# Patient Record
Sex: Female | Born: 1955 | Race: White | Hispanic: No | Smoking: Former smoker
Health system: Southern US, Community
[De-identification: ages and names within clinical notes are randomized; demographics above are authoritative.]

## PROBLEM LIST (undated history)

## (undated) DIAGNOSIS — E785 Hyperlipidemia, unspecified: Secondary | ICD-10-CM

## (undated) DIAGNOSIS — R931 Abnormal findings on diagnostic imaging of heart and coronary circulation: Secondary | ICD-10-CM

## (undated) DIAGNOSIS — I10 Essential (primary) hypertension: Secondary | ICD-10-CM

## (undated) DIAGNOSIS — F419 Anxiety disorder, unspecified: Secondary | ICD-10-CM

## (undated) DIAGNOSIS — E119 Type 2 diabetes mellitus without complications: Secondary | ICD-10-CM

## (undated) DIAGNOSIS — J189 Pneumonia, unspecified organism: Secondary | ICD-10-CM

## (undated) DIAGNOSIS — Z8719 Personal history of other diseases of the digestive system: Secondary | ICD-10-CM

## (undated) DIAGNOSIS — E039 Hypothyroidism, unspecified: Secondary | ICD-10-CM

## (undated) DIAGNOSIS — M1990 Unspecified osteoarthritis, unspecified site: Secondary | ICD-10-CM

## (undated) DIAGNOSIS — E079 Disorder of thyroid, unspecified: Secondary | ICD-10-CM

## (undated) DIAGNOSIS — R0600 Dyspnea, unspecified: Secondary | ICD-10-CM

## (undated) DIAGNOSIS — J439 Emphysema, unspecified: Secondary | ICD-10-CM

## (undated) DIAGNOSIS — J449 Chronic obstructive pulmonary disease, unspecified: Secondary | ICD-10-CM

## (undated) DIAGNOSIS — R7301 Impaired fasting glucose: Secondary | ICD-10-CM

## (undated) DIAGNOSIS — Z8673 Personal history of transient ischemic attack (TIA), and cerebral infarction without residual deficits: Secondary | ICD-10-CM

## (undated) HISTORY — DX: Abnormal findings on diagnostic imaging of heart and coronary circulation: R93.1

## (undated) HISTORY — DX: Type 2 diabetes mellitus without complications: E11.9

## (undated) HISTORY — PX: EYE SURGERY: SHX253

## (undated) HISTORY — DX: Personal history of transient ischemic attack (TIA), and cerebral infarction without residual deficits: Z86.73

## (undated) HISTORY — DX: Impaired fasting glucose: R73.01

## (undated) HISTORY — DX: Hyperlipidemia, unspecified: E78.5

## (undated) HISTORY — DX: Emphysema, unspecified: J43.9

## (undated) HISTORY — PX: ESOPHAGOGASTRODUODENOSCOPY: SHX1529

---

## 2005-05-26 ENCOUNTER — Emergency Department (HOSPITAL_COMMUNITY): Admission: EM | Admit: 2005-05-26 | Discharge: 2005-05-26 | Payer: Self-pay | Admitting: Emergency Medicine

## 2005-09-29 ENCOUNTER — Emergency Department (HOSPITAL_COMMUNITY): Admission: EM | Admit: 2005-09-29 | Discharge: 2005-09-29 | Payer: Self-pay | Admitting: Emergency Medicine

## 2007-02-11 ENCOUNTER — Ambulatory Visit (HOSPITAL_COMMUNITY): Admission: RE | Admit: 2007-02-11 | Discharge: 2007-02-11 | Payer: Self-pay | Admitting: Interventional Cardiology

## 2013-09-20 ENCOUNTER — Inpatient Hospital Stay (HOSPITAL_COMMUNITY)
Admission: EM | Admit: 2013-09-20 | Discharge: 2013-09-22 | DRG: 193 | Disposition: A | Discharge status: Home / Self Care | Payer: Self-pay | Attending: Internal Medicine | Admitting: Internal Medicine

## 2013-09-20 ENCOUNTER — Emergency Department (HOSPITAL_COMMUNITY): Payer: Self-pay

## 2013-09-20 ENCOUNTER — Encounter (HOSPITAL_COMMUNITY): Payer: Self-pay | Admitting: Emergency Medicine

## 2013-09-20 DIAGNOSIS — E039 Hypothyroidism, unspecified: Secondary | ICD-10-CM

## 2013-09-20 DIAGNOSIS — J441 Chronic obstructive pulmonary disease with (acute) exacerbation: Secondary | ICD-10-CM | POA: Diagnosis present

## 2013-09-20 DIAGNOSIS — I10 Essential (primary) hypertension: Secondary | ICD-10-CM | POA: Diagnosis present

## 2013-09-20 DIAGNOSIS — F411 Generalized anxiety disorder: Secondary | ICD-10-CM | POA: Diagnosis present

## 2013-09-20 DIAGNOSIS — J9600 Acute respiratory failure, unspecified whether with hypoxia or hypercapnia: Secondary | ICD-10-CM | POA: Diagnosis present

## 2013-09-20 DIAGNOSIS — Z23 Encounter for immunization: Secondary | ICD-10-CM

## 2013-09-20 DIAGNOSIS — J069 Acute upper respiratory infection, unspecified: Secondary | ICD-10-CM

## 2013-09-20 DIAGNOSIS — J9601 Acute respiratory failure with hypoxia: Secondary | ICD-10-CM | POA: Diagnosis present

## 2013-09-20 DIAGNOSIS — J1100 Influenza due to unidentified influenza virus with unspecified type of pneumonia: Principal | ICD-10-CM | POA: Diagnosis present

## 2013-09-20 DIAGNOSIS — Z87891 Personal history of nicotine dependence: Secondary | ICD-10-CM

## 2013-09-20 DIAGNOSIS — J439 Emphysema, unspecified: Secondary | ICD-10-CM | POA: Diagnosis present

## 2013-09-20 HISTORY — DX: Hypothyroidism, unspecified: E03.9

## 2013-09-20 HISTORY — DX: Essential (primary) hypertension: I10

## 2013-09-20 HISTORY — DX: Disorder of thyroid, unspecified: E07.9

## 2013-09-20 HISTORY — DX: Chronic obstructive pulmonary disease, unspecified: J44.9

## 2013-09-20 HISTORY — DX: Anxiety disorder, unspecified: F41.9

## 2013-09-20 LAB — BASIC METABOLIC PANEL
BUN: 19 mg/dL (ref 6–23)
CALCIUM: 9.3 mg/dL (ref 8.4–10.5)
CHLORIDE: 94 meq/L — AB (ref 96–112)
CO2: 25 mEq/L (ref 19–32)
Creatinine, Ser: 0.67 mg/dL (ref 0.50–1.10)
GFR calc Af Amer: >90 mL/min (ref >90)
GFR calc non Af Amer: >90 mL/min (ref >90)
Glucose, Bld: 100 mg/dL — ABNORMAL HIGH (ref 70–99)
Potassium: 4.5 mEq/L (ref 3.7–5.3)
SODIUM: 137 meq/L (ref 137–147)

## 2013-09-20 LAB — URINALYSIS, ROUTINE W REFLEX MICROSCOPIC
Glucose, UA: NEGATIVE mg/dL
HGB URINE DIPSTICK: NEGATIVE
Ketones, ur: 40 mg/dL — AB
NITRITE: NEGATIVE
PH: 5.5 (ref 5.0–8.0)
Protein, ur: 30 mg/dL — AB
SPECIFIC GRAVITY, URINE: 1.031 — AB (ref 1.005–1.030)
Urobilinogen, UA: 1 mg/dL (ref 0.0–1.0)

## 2013-09-20 LAB — URINE MICROSCOPIC-ADD ON

## 2013-09-20 LAB — CBC WITH DIFFERENTIAL/PLATELET
BASOS ABS: 0 10*3/uL (ref 0.0–0.1)
BASOS PCT: 0 % (ref 0–1)
EOS ABS: 0 10*3/uL (ref 0.0–0.7)
EOS PCT: 0 % (ref 0–5)
HEMATOCRIT: 43.4 % (ref 36.0–46.0)
HEMOGLOBIN: 15.1 g/dL — AB (ref 12.0–15.0)
LYMPHS ABS: 0.5 10*3/uL — AB (ref 0.7–4.0)
LYMPHS PCT: 9 % — AB (ref 12–46)
MCH: 31.5 pg (ref 26.0–34.0)
MCHC: 34.8 g/dL (ref 30.0–36.0)
MCV: 90.6 fL (ref 78.0–100.0)
Monocytes Absolute: 0.4 10*3/uL (ref 0.1–1.0)
Monocytes Relative: 7 % (ref 3–12)
Neutro Abs: 4.6 10*3/uL (ref 1.7–7.7)
Neutrophils Relative %: 84 % — ABNORMAL HIGH (ref 43–77)
Platelets: 305 10*3/uL (ref 150–400)
RBC: 4.79 MIL/uL (ref 3.87–5.11)
RDW: 14.4 % (ref 11.5–15.5)
WBC: 5.4 10*3/uL (ref 4.0–10.5)

## 2013-09-20 LAB — PRO B NATRIURETIC PEPTIDE: PRO B NATRI PEPTIDE: 256.1 pg/mL — AB (ref 0–125)

## 2013-09-20 LAB — STREP PNEUMONIAE URINARY ANTIGEN: STREP PNEUMO URINARY ANTIGEN: NEGATIVE

## 2013-09-20 MED ORDER — ASPIRIN EC 81 MG PO TBEC
81.0000 mg | DELAYED_RELEASE_TABLET | Freq: Every morning | ORAL | Status: DC
  Administered 2013-09-21 – 2013-09-22 (×2): 81 mg via ORAL
  Filled 2013-09-20 (×2): qty 1

## 2013-09-20 MED ORDER — GUAIFENESIN-DM 100-10 MG/5ML PO SYRP
5.0000 mL | ORAL_SOLUTION | ORAL | Status: DC | PRN
  Administered 2013-09-21 – 2013-09-22 (×2): 5 mL via ORAL
  Administered 2013-09-22: 09:00:00 via ORAL
  Filled 2013-09-20 (×3): qty 5

## 2013-09-20 MED ORDER — IPRATROPIUM BROMIDE 0.02 % IN SOLN: 0.5000 mg | RESPIRATORY_TRACT | Status: DC | PRN

## 2013-09-20 MED ORDER — ONDANSETRON HCL 4 MG/2ML IJ SOLN: 4.0000 mg | Freq: Four times a day (QID) | INTRAMUSCULAR | Status: DC | PRN

## 2013-09-20 MED ORDER — MORPHINE SULFATE 2 MG/ML IJ SOLN: 1.0000 mg | INTRAMUSCULAR | Status: DC | PRN

## 2013-09-20 MED ORDER — LEVOFLOXACIN IN D5W 750 MG/150ML IV SOLN
750.0000 mg | INTRAVENOUS | Status: DC
  Administered 2013-09-20 – 2013-09-21 (×2): 750 mg via INTRAVENOUS
  Filled 2013-09-20 (×3): qty 150

## 2013-09-20 MED ORDER — SODIUM CHLORIDE 0.9 % IV SOLN
INTRAVENOUS | Status: DC
  Administered 2013-09-20: 22:00:00 via INTRAVENOUS

## 2013-09-20 MED ORDER — ALBUTEROL (5 MG/ML) CONTINUOUS INHALATION SOLN
10.0000 mg/h | INHALATION_SOLUTION | Freq: Once | RESPIRATORY_TRACT | Status: AC
  Administered 2013-09-20: 10 mg/h via RESPIRATORY_TRACT

## 2013-09-20 MED ORDER — LEVOTHYROXINE SODIUM 112 MCG PO TABS
112.0000 ug | ORAL_TABLET | Freq: Every day | ORAL | Status: DC
  Administered 2013-09-21 – 2013-09-22 (×2): 112 ug via ORAL
  Filled 2013-09-20 (×4): qty 1

## 2013-09-20 MED ORDER — HYDROCODONE-ACETAMINOPHEN 5-325 MG PO TABS: 1.0000 | ORAL_TABLET | ORAL | Status: DC | PRN

## 2013-09-20 MED ORDER — LEVOFLOXACIN 500 MG PO TABS: 500.0000 mg | ORAL_TABLET | Freq: Every day | ORAL | Status: DC

## 2013-09-20 MED ORDER — PREDNISONE 10 MG PO TABS: 20.0000 mg | ORAL_TABLET | Freq: Every day | ORAL | Status: DC

## 2013-09-20 MED ORDER — ACETAMINOPHEN 650 MG RE SUPP: 650.0000 mg | Freq: Four times a day (QID) | RECTAL | Status: DC | PRN

## 2013-09-20 MED ORDER — DILTIAZEM HCL ER 90 MG PO CP12: 135.0000 mg | ORAL_CAPSULE | Freq: Two times a day (BID) | ORAL | Status: DC

## 2013-09-20 MED ORDER — ALBUTEROL SULFATE (2.5 MG/3ML) 0.083% IN NEBU: 2.5000 mg | INHALATION_SOLUTION | RESPIRATORY_TRACT | Status: DC | PRN

## 2013-09-20 MED ORDER — ALBUTEROL SULFATE (2.5 MG/3ML) 0.083% IN NEBU: 2.5000 mg | INHALATION_SOLUTION | RESPIRATORY_TRACT | Status: DC

## 2013-09-20 MED ORDER — ALBUTEROL SULFATE HFA 108 (90 BASE) MCG/ACT IN AERS
2.0000 | INHALATION_SPRAY | RESPIRATORY_TRACT | Status: DC | PRN
  Administered 2013-09-20: 2 via RESPIRATORY_TRACT
  Filled 2013-09-20: qty 6.7

## 2013-09-20 MED ORDER — SODIUM CHLORIDE 0.9 % IV BOLUS (SEPSIS)
1000.0000 mL | Freq: Once | INTRAVENOUS | Status: AC
  Administered 2013-09-20: 1000 mL via INTRAVENOUS

## 2013-09-20 MED ORDER — ACETAMINOPHEN 325 MG PO TABS: 650.0000 mg | ORAL_TABLET | Freq: Four times a day (QID) | ORAL | Status: DC | PRN

## 2013-09-20 MED ORDER — METHYLPREDNISOLONE SODIUM SUCC 125 MG IJ SOLR
60.0000 mg | Freq: Two times a day (BID) | INTRAMUSCULAR | Status: DC
  Administered 2013-09-20: 60 mg via INTRAVENOUS
  Administered 2013-09-21: 11:00:00 via INTRAVENOUS
  Administered 2013-09-21 – 2013-09-22 (×2): 60 mg via INTRAVENOUS
  Filled 2013-09-20 (×3): qty 0.96
  Filled 2013-09-20: qty 2
  Filled 2013-09-20 (×2): qty 0.96

## 2013-09-20 MED ORDER — IPRATROPIUM BROMIDE 0.02 % IN SOLN: 0.5000 mg | RESPIRATORY_TRACT | Status: DC

## 2013-09-20 MED ORDER — DEXAMETHASONE SODIUM PHOSPHATE 10 MG/ML IJ SOLN
10.0000 mg | Freq: Once | INTRAMUSCULAR | Status: AC
  Administered 2013-09-20: 10 mg via INTRAVENOUS
  Filled 2013-09-20: qty 1

## 2013-09-20 MED ORDER — ATENOLOL 25 MG PO TABS
25.0000 mg | ORAL_TABLET | Freq: Every day | ORAL | Status: DC
  Administered 2013-09-22: 25 mg via ORAL
  Filled 2013-09-20 (×2): qty 1

## 2013-09-20 MED ORDER — LEVOFLOXACIN 500 MG PO TABS
500.0000 mg | ORAL_TABLET | Freq: Once | ORAL | Status: AC
  Administered 2013-09-20: 500 mg via ORAL
  Filled 2013-09-20: qty 1

## 2013-09-20 MED ORDER — IPRATROPIUM-ALBUTEROL 0.5-2.5 (3) MG/3ML IN SOLN
3.0000 mL | RESPIRATORY_TRACT | Status: DC
  Administered 2013-09-21: 3 mL via RESPIRATORY_TRACT
  Filled 2013-09-20: qty 3

## 2013-09-20 MED ORDER — DILTIAZEM HCL 90 MG PO TABS
135.0000 mg | ORAL_TABLET | Freq: Two times a day (BID) | ORAL | Status: DC
Start: 2013-09-20 — End: 2013-09-22
  Administered 2013-09-21 – 2013-09-22 (×2): 135 mg via ORAL
  Filled 2013-09-20 (×6): qty 1.5

## 2013-09-20 MED ORDER — IPRATROPIUM BROMIDE 0.02 % IN SOLN
0.5000 mg | Freq: Once | RESPIRATORY_TRACT | Status: AC
  Administered 2013-09-20: 0.5 mg via RESPIRATORY_TRACT
  Filled 2013-09-20: qty 2.5

## 2013-09-20 MED ORDER — ONDANSETRON HCL 4 MG PO TABS: 4.0000 mg | ORAL_TABLET | Freq: Four times a day (QID) | ORAL | Status: DC | PRN

## 2013-09-20 NOTE — Discharge Instructions (Signed)
Chronic Obstructive Pulmonary Disease Exacerbation Chronic obstructive pulmonary disease (COPD) is a common lung condition in which airflow from the lungs is limited. COPD is a general term that can be used to describe many different lung problems that limit airflow, including chronic bronchitis and emphysema. COPD exacerbations are episodes when breathing symptoms become much worse and require extra treatment. Without treatment, COPD exacerbations can be life threatening, and frequent COPD exacerbations can cause further damage to your lungs. CAUSES   Respiratory infections.   Exposure to smoke.   Exposure to air pollution, chemical fumes, or dust. Sometimes there is no apparent cause or trigger. RISK FACTORS  Smoking cigarettes.  Older age.  Frequent prior COPD exacerbations. SIGNS AND SYMPTOMS   Increased coughing.   Increased thick spit (sputum) production.   Increased wheezing.   Increased shortness of breath.   Rapid breathing.   Chest tightness. DIAGNOSIS  Your medical history, a physical exam, and tests will help your health care provider make a diagnosis. Tests may include:  A chest X-ray.  Basic lab tests.  Sputum testing.  An arterial blood gas test. TREATMENT  Depending on the severity of your COPD exacerbation, you may need to be admitted to a hospital for treatment. Some of the treatments commonly used to treat COPD exacerbations are:   Antibiotic medicines.   Bronchodilators. These are drugs that expand the air passages. They may be given with an inhaler or nebulizer. Spacer devices may be needed to help improve drug delivery.  Corticosteroid medicines.  Supplemental oxygen therapy.  HOME CARE INSTRUCTIONS   Do not smoke. Quitting smoking is very important to prevent COPD from getting worse and exacerbations from happening as often.  Avoid exposure to all substances that irritate the airway, especially to tobacco smoke.   If prescribed,  take your antibiotics as directed. Finish them even if you start to feel better.  Only take over-the-counter or prescription medicines as directed by your health care provider.It is important to use correct technique with inhaled medicines.  Drink enough fluids to keep your urine clear or pale yellow (unless you have a medical condition that requires fluid restriction).  Use a cool mist vaporizer. This makes it easier to clear your chest when you cough.   If you have a home nebulizer and oxygen, continue to use them as directed.   Maintain all necessary vaccinations to prevent infections.   Exercise regularly.   Eat a healthy diet.   Keep all follow-up appointments as directed by your health care provider. SEEK IMMEDIATE MEDICAL CARE IF:  You have worsening shortness of breath.   You have trouble talking.   You have severe chest pain.  You have blood in your sputum.  You have a fever.  You have weakness, vomit repeatedly, or faint.   You feel confused.   You continue to get worse. MAKE SURE YOU:   Understand these instructions.  Will watch your condition.  Will get help right away if you are not doing well or get worse. Document Released: 06/15/2007 Document Revised: 06/08/2013 Document Reviewed: 04/22/2013 [facility] Patient Information 2014 [place].  Upper Respiratory Infection, Adult An upper respiratory infection (URI) is also sometimes known as the common cold. The upper respiratory tract includes the nose, sinuses, throat, trachea, and bronchi. Bronchi are the airways leading to the lungs. Most people improve within 1 week, but symptoms can last up to 2 weeks. A residual cough may last even longer.  CAUSES Many different viruses can infect the  tissues lining the upper respiratory tract. The tissues become irritated and inflamed and often become very moist. Mucus production is also common. A cold is contagious. You can easily spread the virus to  others by oral contact. This includes kissing, sharing a glass, coughing, or sneezing. Touching your mouth or nose and then touching a surface, which is then touched by another person, can also spread the virus. SYMPTOMS  Symptoms typically develop 1 to 3 days after you come in contact with a cold virus. Symptoms vary from person to person. They may include:  Runny nose.  Sneezing.  Nasal congestion.  Sinus irritation.  Sore throat.  Loss of voice (laryngitis).  Cough.  Fatigue.  Muscle aches.  Loss of appetite.  Headache.  Low-grade fever. DIAGNOSIS  You might diagnose your own cold based on familiar symptoms, since most people get a cold 2 to 3 times a year. Your caregiver can confirm this based on your exam. Most importantly, your caregiver can check that your symptoms are not due to another disease such as strep throat, sinusitis, pneumonia, asthma, or epiglottitis. Blood tests, throat tests, and X-rays are not necessary to diagnose a common cold, but they may sometimes be helpful in excluding other more serious diseases. Your caregiver will decide if any further tests are required. RISKS AND COMPLICATIONS  You may be at risk for a more severe case of the common cold if you smoke cigarettes, have chronic heart disease (such as heart failure) or lung disease (such as asthma), or if you have a weakened immune system. The very young and very old are also at risk for more serious infections. Bacterial sinusitis, middle ear infections, and bacterial pneumonia can complicate the common cold. The common cold can worsen asthma and chronic obstructive pulmonary disease (COPD). Sometimes, these complications can require emergency medical care and may be life-threatening. PREVENTION  The best way to protect against getting a cold is to practice good hygiene. Avoid oral or hand contact with people with cold symptoms. Wash your hands often if contact occurs. There is no clear evidence that  vitamin C, vitamin E, echinacea, or exercise reduces the chance of developing a cold. However, it is always recommended to get plenty of rest and practice good nutrition. TREATMENT  Treatment is directed at relieving symptoms. There is no cure. Antibiotics are not effective, because the infection is caused by a virus, not by bacteria. Treatment may include:  Increased fluid intake. Sports drinks offer valuable electrolytes, sugars, and fluids.  Breathing heated mist or steam (vaporizer or shower).  Eating chicken soup or other clear broths, and maintaining good nutrition.  Getting plenty of rest.  Using gargles or lozenges for comfort.  Controlling fevers with ibuprofen or acetaminophen as directed by your caregiver.  Increasing usage of your inhaler if you have asthma. Zinc gel and zinc lozenges, taken in the first 24 hours of the common cold, can shorten the duration and lessen the severity of symptoms. Pain medicines may help with fever, muscle aches, and throat pain. A variety of non-prescription medicines are available to treat congestion and runny nose. Your caregiver can make recommendations and may suggest nasal or lung inhalers for other symptoms.  HOME CARE INSTRUCTIONS   Only take over-the-counter or prescription medicines for pain, discomfort, or fever as directed by your caregiver.  Use a warm mist humidifier or inhale steam from a shower to increase air moisture. This may keep secretions moist and make it easier to breathe.  Drink enough  water and fluids to keep your urine clear or pale yellow.  Rest as needed.  Return to work when your temperature has returned to normal or as your caregiver advises. You may need to stay home longer to avoid infecting others. You can also use a face mask and careful hand washing to prevent spread of the virus. SEEK MEDICAL CARE IF:   After the first few days, you feel you are getting worse rather than better.  You need your caregiver's  advice about medicines to control symptoms.  You develop chills, worsening shortness of breath, or brown or red sputum. These may be signs of pneumonia.  You develop yellow or brown nasal discharge or pain in the face, especially when you bend forward. These may be signs of sinusitis.  You develop a fever, swollen neck glands, pain with swallowing, or white areas in the back of your throat. These may be signs of strep throat. SEEK IMMEDIATE MEDICAL CARE IF:   You have a fever.  You develop severe or persistent headache, ear pain, sinus pain, or chest pain.  You develop wheezing, a prolonged cough, cough up blood, or have a change in your usual mucus (if you have chronic lung disease).  You develop sore muscles or a stiff neck. Document Released: 02/11/2001 Document Revised: 11/10/2011 Document Reviewed: 12/20/2010 [facility] Patient Information 2014 [place], [place].

## 2013-09-20 NOTE — Progress Notes (Signed)
  Pt admitted to the unit. Pt is stable, alert and oriented per baseline. Oriented to room, staff, and call bell. Educated to call for any assistance. Bed in lowest position, call bell within reach- will continue to monitor. 

## 2013-09-20 NOTE — ED Notes (Signed)
Waiting on medication from the pharmacy before discharging patient.

## 2013-09-20 NOTE — Progress Notes (Signed)
COPD Gold initiated. E-mail sent.

## 2013-09-20 NOTE — ED Notes (Signed)
Pt sent here by her doctor with low sats, cough, SOB. Hx of COPD.

## 2013-09-20 NOTE — ED Notes (Signed)
Pt reports flu like symptoms since yesterday am. Reports productive cough, congestion, and fever.

## 2013-09-20 NOTE — H&P (Signed)
[facility] History and Physical  Cheryl Haley WUJ:[id number] DOB: [birth year] DOA: 09/20/2013  Referring physician: ER physician PCP: Gennette Pac, MD   Chief Complaint: shortness of breath  HPI:  58 year old female with past medical history of COPD, HTN, levothyroxine who presented to [facility] ED 09/20/2013 with worsening shortness of breath, productive cough and fever for past couple of days. He felt flu like symptoms, upper respiratory tract congestion and though she would eventually feel better but her symptoms got worse. She had some chest discomfort with cough. No abdominal pain, no nausea or vomiting. No diarrhea or constipation. No blood in stool or urine. No lightheadedness or loss of consciousness. In ED, BP was 110/63, HR 97, Tmax 99.31F and oxygen saturation was 85% on room air but it has improved to 100% with 2 L [place] oxygen support. Admission blood work was essentially unremarkable. CXR did not reveal acute infectious process. For possible clinical pneumonia pt was started on Levaquin. Pt was given nebulizer treatments in ED and prednisone PO one dose.  Assessment and Plan:  Principal Problem:   Acute respiratory failure with hypoxia - likely due to COPD exacerbation, pneumonia - oxygen support via nasal canula to keep O2 saturation above 90% - continue nebulizer treatments every 4 hours as needed and scheduled - continue solumedrol 60 mg Q 12 hours IV - Levaquin for possible pneumonia Active Problems:   Community acquired pneumonia - started IV Levaquin - follow up blood culture results, resp culture, legionella, strep pneumo, influenza   COPD exacerbation - COPD gold alert order in place - COPD order set in place - management with BD steroids, antibiotics   HTN (hypertension) - continue atenolol and cardizem   Unspecified hypothyroidism - continue levothyroxine  Radiological Exams on Admission: Dg Chest Port 1 View 09/20/2013   IMPRESSION: No evidence of  acute airspace disease.     Code Status: Full Family Communication: Pt at bedside Disposition Plan: Admit for further evaluation  Leisa Lenz, MD  [facility] Pager [phone]  Review of Systems:  Constitutional: positive for fever, chills and malaise/fatigue. Negative for diaphoresis.  HENT: Negative for hearing loss, ear pain, nosebleeds, congestion, sore throat, neck pain, tinnitus and ear discharge.   Eyes: Negative for blurred vision, double vision, photophobia, pain, discharge and redness.  Respiratory: positive for cough, sputum production, shortness of breath, wheezing.   Cardiovascular: Negative for chest pain, palpitations, orthopnea, claudication and leg swelling.  Gastrointestinal: Negative for nausea, vomiting and abdominal pain. Negative for heartburn, constipation, blood in stool and melena.  Genitourinary: Negative for dysuria, urgency, frequency, hematuria and flank pain.  Musculoskeletal: Negative for myalgias, back pain, joint pain and falls.  Skin: Negative for itching and rash.  Neurological: Negative for dizziness and weakness. Negative for tingling, tremors, sensory change, speech change, focal weakness, loss of consciousness and headaches.  Endo/Heme/Allergies: Negative for environmental allergies and polydipsia. Does not bruise/bleed easily.  Psychiatric/Behavioral: Negative for suicidal ideas. The patient is not nervous/anxious.      Past Medical History  Diagnosis Date  . Hypertension   . Anxiety   . Thyroid disease   . COPD (chronic obstructive pulmonary disease)    History reviewed. No pertinent past surgical history. Social History:  reports that she has quit smoking. She does not have any smokeless tobacco history on file. She reports that she does not drink alcohol. Her drug history is not on file.  No Known Allergies  Family History: Family medical history significant for HTN, HLD  Prior to Admission medications   Medication Sig Start  Date End Date Taking? Authorizing Provider  aspirin EC 81 MG tablet Take 81 mg by mouth every morning.   Yes Historical Provider, MD  atenolol (TENORMIN) 25 MG tablet Take 25 mg by mouth daily.   Yes Historical Provider, MD  diltiazem (CARDIZEM SR) 90 MG 12 hr capsule Take 135 mg by mouth 2 (two) times daily. Takes 1.5 tabs twice daily   Yes Historical Provider, MD  levothyroxine (SYNTHROID, LEVOTHROID) 112 MCG tablet Take 112 mcg by mouth daily before breakfast.   Yes Historical Provider, MD  levofloxacin (LEVAQUIN) 500 MG tablet Take 1 tablet (500 mg total) by mouth daily. 09/20/13   Tiffany Marilu Favre, PA-C  predniSONE (DELTASONE) 10 MG tablet Take 2 tablets (20 mg total) by mouth daily. 09/20/13   Linus Mako, PA-C   Physical Exam: Filed Vitals:   09/20/13 1630 09/20/13 1635 09/20/13 1900 09/20/13 2005  BP:  110/63 111/64   Pulse:  112 97 106  Temp:      TempSrc:      Resp:  20 27 22   SpO2: 100% 100% 99% 85%    Physical Exam  Constitutional: Appears well-developed and well-nourished. No distress.  HENT: Normocephalic. No tonsillar erythema or exudates. Eyes: Conjunctivae and EOM are normal. PERRLA, no scleral icterus.  Neck: Normal ROM. Neck supple. No JVD. No tracheal deviation. No thyromegaly.  CVS: RRR, S1/S2 +, no murmurs, no gallops, no carotid bruit.  Pulmonary: wheezing appreciated in mid and upper lung lobes, no crackles Abdominal: Soft. BS +,  no distension, tenderness, rebound or guarding.  Musculoskeletal: Normal range of motion. No edema and no tenderness.  Lymphadenopathy: No lymphadenopathy noted, cervical, inguinal. Neuro: Alert. Normal reflexes, muscle tone coordination. No cranial nerve deficit. Skin: Skin is warm and dry. No rash noted. Not diaphoretic. No erythema. No pallor.  Psychiatric: Normal mood and affect. Behavior, judgment, thought content normal.   Labs on Admission:  Basic Metabolic Panel:  Recent Labs Lab 09/20/13 1640  NA 137  K 4.5  CL  94*  CO2 25  GLUCOSE 100*  BUN 19  CREATININE 0.67  CALCIUM 9.3   Liver Function Tests: No results found for this basename: AST, ALT, ALKPHOS, BILITOT, PROT, ALBUMIN,  in the last 168 hours No results found for this basename: LIPASE, AMYLASE,  in the last 168 hours No results found for this basename: AMMONIA,  in the last 168 hours CBC:  Recent Labs Lab 09/20/13 1640  WBC 5.4  NEUTROABS 4.6  HGB 15.1*  HCT 43.4  MCV 90.6  PLT 305   Cardiac Enzymes: No results found for this basename: CKTOTAL, CKMB, CKMBINDEX, TROPONINI,  in the last 168 hours BNP: No components found with this basename: POCBNP,  CBG: No results found for this basename: GLUCAP,  in the last 168 hours  If 7PM-7AM, please contact night-coverage [email or web address] Password [email or web address] 09/20/2013, 9:18 PM

## 2013-09-20 NOTE — ED Provider Notes (Signed)
CSN: [id number]     Arrival date & time 09/20/13  1544 History   First MD Initiated Contact with Patient 09/20/13 1547     Chief Complaint  Patient presents with  . Shortness of Breath   (Consider location/radiation/quality/duration/timing/severity/associated sxs/prior Treatment) HPI PCP: [facility]   Patient presents to the ED from [place] for SOB. She was saturating at the mid 80's at her doctor's office therefore they felt she should go to the emergency department for further care. She has a past medical history of COPD and hypertension. She has also been coughing a lot, shortness of breath and having some mild chest discomfort. She states that she did not want to come in but she got so bad that she did not have a choice. She is not on oxygen at home, she does not have any inhalers at home and has not tried any treatments. She denies nausea vomiting, diarrhea, fevers, body aches, weakness. Pt sating at 98% with 2 L [place] on in the ER.   Past Medical History  Diagnosis Date  . Hypertension   . Anxiety   . Thyroid disease   . COPD (chronic obstructive pulmonary disease)    History reviewed. No pertinent past surgical history. History reviewed. No pertinent family history. History  Substance Use Topics  . Smoking status: Former Research scientist (life sciences)  . Smokeless tobacco: Not on file  . Alcohol Use: No   OB History   Grav Para Term Preterm Abortions TAB SAB Ect Mult Living                 Review of Systems The patient denies anorexia, fever, weight loss,, vision loss, decreased hearing, hoarseness, chest pain, syncope, peripheral edema, balance deficits, hemoptysis, abdominal pain, melena, hematochezia, severe indigestion/heartburn, hematuria, incontinence, genital sores, muscle weakness, suspicious skin lesions, transient blindness, difficulty walking, depression, unusual weight change, abnormal bleeding, enlarged lymph nodes, angioedema, and breast masses.  Allergies  Review of  patient's allergies indicates no known allergies.  Home Medications   Current Outpatient Rx  Name  Route  Sig  Dispense  Refill  . aspirin EC 81 MG tablet   Oral   Take 81 mg by mouth every morning.         Marland Kitchen atenolol (TENORMIN) 25 MG tablet   Oral   Take 25 mg by mouth daily.         Marland Kitchen diltiazem (CARDIZEM SR) 90 MG 12 hr capsule   Oral   Take 135 mg by mouth 2 (two) times daily. Takes 1.5 tabs twice daily         . levothyroxine (SYNTHROID, LEVOTHROID) 112 MCG tablet   Oral   Take 112 mcg by mouth daily before breakfast.         . levofloxacin (LEVAQUIN) 500 MG tablet   Oral   Take 1 tablet (500 mg total) by mouth daily.   7 tablet   0   . predniSONE (DELTASONE) 10 MG tablet   Oral   Take 2 tablets (20 mg total) by mouth daily.   21 tablet   0     Prednisone dose pack directions:   6 tabs on day ...    BP 111/64  Pulse 97  Temp(Src) 99.5 F (37.5 C) (Oral)  Resp 27  SpO2 99% Physical Exam  Nursing note and vitals reviewed. Constitutional: She appears well-developed and well-nourished. No distress. She is not intubated.  HENT:  Head: Normocephalic and atraumatic.  Eyes: Pupils are equal, round,  and reactive to light.  Neck: Normal range of motion. Neck supple.  Cardiovascular: Normal rate and regular rhythm.   Pulmonary/Chest: Effort normal. No accessory muscle usage. She is not intubated. No respiratory distress. She has decreased breath sounds (pt coughing during exam). She has wheezes.  Abdominal: Soft.  Neurological: She is alert.  Skin: Skin is warm and dry.    ED Course  Procedures (including critical care time) Labs Review Labs Reviewed  CBC WITH DIFFERENTIAL - Abnormal; Notable for the following:    Hemoglobin 15.1 (*)    Neutrophils Relative % 84 (*)    Lymphocytes Relative 9 (*)    Lymphs Abs 0.5 (*)    All other components within normal limits  BASIC METABOLIC PANEL - Abnormal; Notable for the following:    Chloride 94 (*)     Glucose, Bld 100 (*)    All other components within normal limits  PRO B NATRIURETIC PEPTIDE - Abnormal; Notable for the following:    Pro B Natriuretic peptide (BNP) 256.1 (*)    All other components within normal limits  URINALYSIS, ROUTINE W REFLEX MICROSCOPIC - Abnormal; Notable for the following:    Color, Urine AMBER (*)    APPearance CLOUDY (*)    Specific Gravity, Urine 1.031 (*)    Bilirubin Urine MODERATE (*)    Ketones, ur 40 (*)    Protein, ur 30 (*)    Leukocytes, UA SMALL (*)    All other components within normal limits  URINE MICROSCOPIC-ADD ON - Abnormal; Notable for the following:    Squamous Epithelial / LPF FEW (*)    Bacteria, UA FEW (*)    Casts HYALINE CASTS (*)    All other components within normal limits  CULTURE, EXPECTORATED SPUTUM-ASSESSMENT   Imaging Review Dg Chest Port 1 View  09/20/2013   CLINICAL DATA:  Shortness of breath and cough.  COPD.  EXAM: PORTABLE CHEST - 1 VIEW  COMPARISON:  05/26/2005  FINDINGS: The cardiomediastinal silhouette is within normal limits. Thoracic aortic calcification is noted. The lungs are hyperinflated, slightly less so than on the prior study. There is no evidence of focal airspace consolidation, edema, pleural effusion, or pneumothorax. No acute osseous abnormality is identified.  IMPRESSION: No evidence of acute airspace disease.   Electronically Signed   By: Logan Bores   On: 09/20/2013 16:30    EKG Interpretation   None       MDM   1. URI (upper respiratory infection)   2. COPD exacerbation    CRITICAL CARE Performed by: Linus Mako Total critical care time: 30 Critical care time was exclusive of separately billable procedures and treating other patients. Critical care was necessary to treat or prevent imminent or life-threatening deterioration. Critical care was time spent personally by me on the following activities: development of treatment plan with patient and/or surrogate as well as nursing,  discussions with consultants, evaluation of patient's response to treatment, examination of patient, obtaining history from patient or surrogate, ordering and performing treatments and interventions, ordering and review of laboratory studies, ordering and review of radiographic studies, pulse oximetry and re-evaluation of patient's condition.  Pt given 60 mg PO Prednisone and 1 hour continuous neb in the ED. She showed significant improvement and says she is feeling much better.  Pt ambulated by myself in the ED. Her O2 dropped to about 90-91 percent after a minute of standing up with no O2. She denied increased fatigue. I gave her the option of trying  outpatient treatment vs inpatient. She chooses to go home. She has a friend with her who says she will care for her so she doesn't have to exert much energy.   Will give her antibiotics, prednisone taper and an albuterol inhaler (inhaler given in ED). Her sister has a pulse ox machine at home, she has been advised to watch her oxygen, if it drops below 90 then she is to return to the ED for admission due to failed outpatient therapy.  58 y.o.Maudell Bratz's evaluation in the Emergency Department is complete. It has been determined that no acute conditions requiring further emergency intervention are present at this time. The patient/guardian have been advised of the diagnosis and plan. We have discussed signs and symptoms that warrant return to the ED, such as changes or worsening in symptoms.  Vital signs are stable at discharge. Filed Vitals:   09/20/13 1900  BP: 111/64  Pulse: 97  Temp:   Resp: 27    Patient/guardian has voiced understanding and agreed to follow-up with the PCP or specialist.    Linus Mako, PA-C 09/20/13 1908  Patient originally discharged and @ l [place] removed. When nurse went to discharge her she was sating at 85 % at rest. Will have to admit, PCP is Dr. Rex Kras, [facility], [facility]  [address], PA-C 09/20/13 2019

## 2013-09-21 ENCOUNTER — Encounter (HOSPITAL_COMMUNITY): Payer: Self-pay | Admitting: General Practice

## 2013-09-21 DIAGNOSIS — J069 Acute upper respiratory infection, unspecified: Secondary | ICD-10-CM

## 2013-09-21 LAB — COMPREHENSIVE METABOLIC PANEL
ALBUMIN: 3.7 g/dL (ref 3.5–5.2)
ALK PHOS: 52 U/L (ref 39–117)
ALT: 13 U/L (ref 0–35)
AST: 26 U/L (ref 0–37)
BUN: 11 mg/dL (ref 6–23)
CHLORIDE: 97 meq/L (ref 96–112)
CO2: 25 meq/L (ref 19–32)
Calcium: 8.5 mg/dL (ref 8.4–10.5)
Creatinine, Ser: 0.43 mg/dL — ABNORMAL LOW (ref 0.50–1.10)
GFR calc Af Amer: >90 mL/min (ref >90)
Glucose, Bld: 133 mg/dL — ABNORMAL HIGH (ref 70–99)
POTASSIUM: 4.5 meq/L (ref 3.7–5.3)
Sodium: 136 mEq/L — ABNORMAL LOW (ref 137–147)
Total Protein: 7.5 g/dL (ref 6.0–8.3)

## 2013-09-21 LAB — INFLUENZA PANEL BY PCR (TYPE A & B)
H1N1 flu by pcr: NOT DETECTED
Influenza A By PCR: POSITIVE — AB
Influenza B By PCR: NEGATIVE

## 2013-09-21 LAB — CBC
HCT: 37.2 % (ref 36.0–46.0)
Hemoglobin: 12.9 g/dL (ref 12.0–15.0)
MCH: 31.2 pg (ref 26.0–34.0)
MCHC: 34.7 g/dL (ref 30.0–36.0)
MCV: 89.9 fL (ref 78.0–100.0)
PLATELETS: 260 10*3/uL (ref 150–400)
RBC: 4.14 MIL/uL (ref 3.87–5.11)
RDW: 14.4 % (ref 11.5–15.5)
WBC: 4.2 10*3/uL (ref 4.0–10.5)

## 2013-09-21 LAB — LEGIONELLA ANTIGEN, URINE: Legionella Antigen, Urine: NEGATIVE

## 2013-09-21 LAB — GLUCOSE, CAPILLARY: GLUCOSE-CAPILLARY: 132 mg/dL — AB (ref 70–99)

## 2013-09-21 MED ORDER — MOMETASONE FURO-FORMOTEROL FUM 100-5 MCG/ACT IN AERO
2.0000 | INHALATION_SPRAY | Freq: Two times a day (BID) | RESPIRATORY_TRACT | Status: DC
  Administered 2013-09-21 – 2013-09-22 (×2): 2 via RESPIRATORY_TRACT
  Filled 2013-09-21: qty 8.8

## 2013-09-21 MED ORDER — DIPHENHYDRAMINE HCL 25 MG PO CAPS
25.0000 mg | ORAL_CAPSULE | Freq: Once | ORAL | Status: DC
  Filled 2013-09-21: qty 1

## 2013-09-21 MED ORDER — ENSURE COMPLETE PO LIQD
237.0000 mL | Freq: Two times a day (BID) | ORAL | Status: DC
  Administered 2013-09-21 – 2013-09-22 (×3): 237 mL via ORAL

## 2013-09-21 MED ORDER — OSELTAMIVIR PHOSPHATE 75 MG PO CAPS
75.0000 mg | ORAL_CAPSULE | Freq: Two times a day (BID) | ORAL | Status: DC
  Administered 2013-09-21 – 2013-09-22 (×3): 75 mg via ORAL
  Filled 2013-09-21 (×4): qty 1

## 2013-09-21 MED ORDER — SALINE SPRAY 0.65 % NA SOLN
1.0000 | NASAL | Status: DC | PRN
  Filled 2013-09-21: qty 44

## 2013-09-21 MED ORDER — IPRATROPIUM-ALBUTEROL 0.5-2.5 (3) MG/3ML IN SOLN
3.0000 mL | Freq: Three times a day (TID) | RESPIRATORY_TRACT | Status: DC
  Administered 2013-09-21 – 2013-09-22 (×4): 3 mL via RESPIRATORY_TRACT
  Filled 2013-09-21 (×5): qty 3

## 2013-09-21 MED ORDER — INFLUENZA VAC SPLIT QUAD 0.5 ML IM SUSP
0.5000 mL | INTRAMUSCULAR | Status: AC
Start: 2013-09-22 — End: 2013-09-22
  Administered 2013-09-22: 0.5 mL via INTRAMUSCULAR
  Filled 2013-09-21: qty 0.5

## 2013-09-21 NOTE — Progress Notes (Signed)
INITIAL NUTRITION ASSESSMENT  DOCUMENTATION CODES Per approved criteria  -Severe malnutrition in the context of chronic illness -Underweight   INTERVENTION: Add Ensure Complete po BID, each supplement provides 350 kcal and 13 grams of protein, prefers chocolate. RD to continue to follow nutrition care plan.  NUTRITION DIAGNOSIS: Increased nutrient needs related to COPD as evidenced by estimated needs.   Goal: Intake to meet >90% of estimated nutrition needs.  Monitor:  weight trends, lab trends, I/O's, PO intake, supplement tolerance  Reason for Assessment: Malnutrition Screening Tool  58 y.o. female  Admitting Dx: Acute respiratory failure with hypoxia  ASSESSMENT: PMHx significant for COPD, HTN. Admitted with worsening SOB, productive cough and fever x a few days. Work-up reveals COPD exacerbation and CAP.  COPD GOLD protocol initiated. RD consulted accordingly for Assessment.  Pt's current body weight of 99 lb puts her in the underweight category given BMI is 17.1. Pt reports that her usual weight is between 100 - 110 lb. Attributes most recent weight loss down to 99 lb of being on Clear Liquids for 2 days. MD has just upgraded her diet to Regular, which she is excited about. Normally eats 3 meals a day. Reports good appetite prior to symptoms. Has tried Ensure and likes it, doesn't drink it Regularly 2/2 cost. Encouraged store-brand if needed. Pt would like Ensure while here.  Nutrition Focused Physical Exam:  Subcutaneous Fat:  Orbital Region: moderate depletion Upper Arm Region: severe depletion Thoracic and Lumbar Region: moderate depletion  Muscle:  Temple Region: severe depletion Clavicle Bone Region: moderate depletion Clavicle and Acromion Bone Region: moderate depletion Scapular Bone Region: moderate depletion Dorsal Hand: n/a Patellar Region: n/a Anterior Thigh Region: n/a Posterior Calf Region: n/a  Edema: none  Pt meets criteria for severe  MALNUTRITION in the context of chronic illness as evidenced by severe fat and muscle mass loss.  Height: Ht Readings from Last 1 Encounters:  09/20/13 5\' 4"  (1.626 m)    Weight: Wt Readings from Last 1 Encounters:  09/21/13 99 lb 13.9 oz (45.3 kg)    Ideal Body Weight: 120 lb  % Ideal Body Weight: 82.5%  Wt Readings from Last 10 Encounters:  09/21/13 99 lb 13.9 oz (45.3 kg)    Usual Body Weight: 100 - 110 lb  % Usual Body Weight: 99%  BMI:  Body mass index is 17.13 kg/(m^2). Underweight  Estimated Nutritional Needs: Kcal: 1350 - 1500 Protein: 65 - 75 g Fluid: at least 1.5 liters daily  Skin: intact  Diet Order: General  EDUCATION NEEDS: -No education needs identified at this time   Intake/Output Summary (Last 24 hours) at 09/21/13 1242 Last data filed at 09/20/13 2157  Gross per 24 hour  Intake      0 ml  Output    210 ml  Net   -210 ml    Last BM: 1/18  Labs:   Recent Labs Lab 09/20/13 1640 09/21/13 0637  NA 137 136*  K 4.5 4.5  CL 94* 97  CO2 25 25  BUN 19 11  CREATININE 0.67 0.43*  CALCIUM 9.3 8.5  GLUCOSE 100* 133*    CBG (last 3)   Recent Labs  09/21/13 0750  GLUCAP 132*    Scheduled Meds: . aspirin EC  81 mg Oral q morning - 10a  . atenolol  25 mg Oral Daily  . diltiazem  135 mg Oral Q12H  . [START ON 09/22/2013] influenza vac split quadrivalent PF  0.5 mL Intramuscular Tomorrow-1000  .  ipratropium-albuterol  3 mL Nebulization TID  . levofloxacin (LEVAQUIN) IV  750 mg Intravenous Q24H  . levothyroxine  112 mcg Oral QAC breakfast  . methylPREDNISolone (SOLU-MEDROL) injection  60 mg Intravenous Q12H  . mometasone-formoterol  2 puff Inhalation BID  . oseltamivir  75 mg Oral BID    Continuous Infusions:    Past Medical History  Diagnosis Date  . Hypertension   . Anxiety   . Thyroid disease   . COPD (chronic obstructive pulmonary disease)     History reviewed. No pertinent past surgical history.  Inda Coke MS,  RD, LDN Pager: [phone] After-hours pager: [phone]

## 2013-09-21 NOTE — Evaluation (Signed)
Physical Therapy Evaluation Patient Details Name: Cheryl Haley MRN: [id number] DOB: Oct 26, 1955 Today's Date: 09/21/2013 Time: 1535-1600 PT Time Calculation (min): 25 min  PT Assessment / Plan / Recommendation History of Present Illness  58 year old female with past medical history of COPD, HTN, levothyroxine who presented to [facility] ED 09/20/2013 with worsening shortness of breath, productive cough and fever for past couple of days. He felt flu like symptoms, upper respiratory tract congestion and though she would eventually feel better but her symptoms got worse. She had some chest discomfort with cough. No abdominal pain, no nausea or vomiting. No diarrhea or constipation. No blood in stool or urine. No lightheadedness or loss of consciousness.  Clinical Impression  .Pt admitted with SOB due to Flu and COPD. Pt currently with functional limitations due to the deficits listed below (see PT Problem List).  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      PT Assessment  Patient needs continued PT services    Follow Up Recommendations  No PT follow up    Does the patient have the potential to tolerate intense rehabilitation      Barriers to Discharge        Equipment Recommendations  None recommended by PT    Recommendations for Other Services     Frequency Min 1X/week    Precautions / Restrictions Precautions Precautions: None Precaution Comments: presently needs O2   Pertinent Vitals/Pain Sats at rest on RA for 3 min.  at 91%.  With gait on RA sats dropped to 87% EHR 114 bpm.  With O@ at 2L [place] sats rose up to 93% during gait.  EHR at 109bpm.      Mobility  Bed Mobility Overal bed mobility: Independent Transfers Overall transfer level: Independent Ambulation/Gait Ambulation/Gait assistance:  Supervision Ambulation Distance (Feet): 380 Feet (with 3 standing rest breaks) Gait Pattern/deviations: WFL(Within Functional Limits) Gait velocity: slower Gait velocity interpretation: at or above normal speed for age/gender    Exercises     PT Diagnosis: Other (comment) (decr activity tolerance)  PT Problem List: Decreased activity tolerance;Cardiopulmonary status limiting activity PT Treatment Interventions: Gait training;Stair training;Functional mobility training;Patient/family education     PT Goals(Current goals can be found in the care plan section) Acute Rehab PT Goals Patient Stated Goal: Need to be Independent so I can help my room mate who is LVAD pt. PT Goal Formulation: With patient Time For Goal Achievement: 09/28/13 Potential to Achieve Goals: Good  Visit Information  Last PT Received On: 09/21/13 Assistance Needed: +1 History of Present Illness: 58 year old female with past medical history of COPD, HTN, levothyroxine who presented to [facility] ED 09/20/2013 with worsening shortness of breath, productive cough and fever for past couple of days. He felt flu like symptoms, upper respiratory tract congestion and though she would eventually feel better but her symptoms got worse. She had some chest discomfort with cough. No abdominal pain, no nausea or vomiting. No diarrhea or constipation. No blood in stool or urine. No lightheadedness or loss of consciousness.       Prior [place] expects to be discharged to:: Private residence Living Arrangements: Non-relatives/Friends Available Help at Discharge: Family;Available PRN/intermittently;Other (Comment) Type of Home: House Home Access: Stairs to enter Home Layout: One level Home Equipment: None Prior Function Level of Independence:  Independent Communication Communication: No difficulties    Cognition  Cognition Arousal/Alertness: Awake/alert Behavior During Therapy: WFL for tasks  assessed/performed Overall Cognitive Status: Within Functional Limits for tasks assessed    Extremity/Trunk Assessment Upper Extremity Assessment Upper Extremity Assessment: Overall WFL for tasks assessed Lower Extremity Assessment Lower Extremity Assessment: Overall WFL for tasks assessed   Balance Balance Overall balance assessment: No apparent balance deficits (not formally assessed)  End of Session PT - End of Session Equipment Utilized During Treatment: Oxygen Activity Tolerance: Patient tolerated treatment well;Other (comment) (when on oxygen) Patient left: in bed;with bed alarm set Nurse Communication: Mobility status  GP     Mang Hazelrigg, Tessie Fass 09/21/2013, 4:07 PM 09/21/2013  Donnella Sham, [place] [phone]  (pager)

## 2013-09-21 NOTE — Care Management Note (Addendum)
    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     Action/Plan:   Anticipated DC Date:  09/22/2013   Anticipated DC Plan:  [place]  CM consult  [facility]      Choice offered to / List presented to:     DME arranged  [facility]      DME agency  [facility].        Status of service:  Completed, signed off Medicare Important Message given?   (If response is "NO", the following Medicare IM given date fields will be blank) Date Medicare IM given:   Date Additional Medicare IM given:    Discharge Disposition:  HOME/SELF CARE  Per UR Regulation:  Reviewed for med. necessity/level of care/duration of stay  If discussed at [place] of Stay Meetings, dates discussed:    Comments:  09/22/13 16:27 Tomi Bamberger RN, BSN [phone] patient is for dc today, patient states she may need ast with medications, NCM will assist her with Match program, also awaiting correct order for home oxygen.  Larkin Ina with [facility] is speaking with patient to see if she can afford the home oxygen.  NCM will give patient the Match letter to help with medications. If patient can not afford the oxygen she may not be able to get home oxygen.  NCM unable to get on pdmi, called outpt pharmacy to ask if they can fill scripts for patient and I put her in system tomorrow they said yes.  NCM instructed patient scripts are $3 each and this is the only time we can ast her for these, she will go to outpt pharmacy to pick up.  Also she was able to get the oxygen, Larkin Ina brought up to her room.  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  NCM received referral for COPD Gold,  made referral to [facility]. NCM will continue to follow for dc needs.

## 2013-09-21 NOTE — Progress Notes (Signed)
UR completed. Farhiya Rosten RN CCM Case Mgmt 

## 2013-09-21 NOTE — Progress Notes (Signed)
[facility] PROGRESS NOTE  Cheryl Haley JSE:[id number] DOB: Jun 12, 1956 DOA: 09/20/2013 PCP: Gennette Pac, MD  Assessment/Plan: 57 year old female with past medical history of COPD, HTN, levothyroxine who presented to [facility] ED 09/20/2013 with worsening shortness of breath, productive cough and fever for past couple of days.    Principal Problem:  Acute respiratory failure with hypoxia  - likely due to COPD exacerbation, pneumonia  - oxygen support via nasal canula to keep O2 saturation above 90%  - continue nebulizer treatments every 4 hours as needed and scheduled  - continue solumedrol 60 mg Q 12 hours IV   Active Problems:  Community acquired pneumonia  - started IV Levaquin  - follow up blood culture results, resp culture, legionella, strep pneumo, +influenza  COPD exacerbation  - COPD gold alert order in place  - COPD order set in place  - management with BD steroids, antibiotics  HTN (hypertension)  - continue atenolol and cardizem  Unspecified hypothyroidism  - continue levothyroxine  Radiological Exams on Admission:  Dg Chest Port 1 View  09/20/2013 IMPRESSION: No evidence of acute airspace disease.    Code Status: full Family Communication: d/w patient (indicate person spoken with, relationship, and if by phone, the number) Disposition Plan: home 24-48 hours    Consultants:  None   Procedures:  None   Antibiotics:   (indicate start date, and stop date if known)  HPI/Subjective: alert  Objective: Filed Vitals:   09/21/13 0418  BP: 122/66  Pulse: 98  Temp: 98.7 F (37.1 C)  Resp: 20    Intake/Output Summary (Last 24 hours) at 09/21/13 1230 Last data filed at 09/20/13 2157  Gross per 24 hour  Intake      0 ml  Output    210 ml  Net   -210 ml   Filed Weights   09/20/13 2158 09/21/13 0216  Weight: 45.3 kg (99 lb 13.9 oz) 45.3 kg (99 lb 13.9 oz)    Exam:   General:  alert  Cardiovascular: s1,s2 rrr  Respiratory: poor  ventilation BL  Abdomen: soft, nt, nd   Musculoskeletal: no edema   Data Reviewed: Basic Metabolic Panel:  Recent Labs Lab 09/20/13 1640 09/21/13 0637  NA 137 136*  K 4.5 4.5  CL 94* 97  CO2 25 25  GLUCOSE 100* 133*  BUN 19 11  CREATININE 0.67 0.43*  CALCIUM 9.3 8.5   Liver Function Tests:  Recent Labs Lab 09/21/13 0637  AST 26  ALT 13  ALKPHOS 52  BILITOT <0.2*  PROT 7.5  ALBUMIN 3.7   No results found for this basename: LIPASE, AMYLASE,  in the last 168 hours No results found for this basename: AMMONIA,  in the last 168 hours CBC:  Recent Labs Lab 09/20/13 1640 09/21/13 0637  WBC 5.4 4.2  NEUTROABS 4.6  --   HGB 15.1* 12.9  HCT 43.4 37.2  MCV 90.6 89.9  PLT 305 260   Cardiac Enzymes: No results found for this basename: CKTOTAL, CKMB, CKMBINDEX, TROPONINI,  in the last 168 hours BNP (last 3 results)  Recent Labs  09/20/13 1640  PROBNP 256.1*   CBG:  Recent Labs Lab 09/21/13 0750  GLUCAP 132*    No results found for this or any previous visit (from the past 240 hour(s)).   Studies: Dg Chest Port 1 View  09/20/2013   CLINICAL DATA:  Shortness of breath and cough.  COPD.  EXAM: PORTABLE CHEST - 1 VIEW  COMPARISON:  05/26/2005  FINDINGS: The cardiomediastinal silhouette is within normal limits. Thoracic aortic calcification is noted. The lungs are hyperinflated, slightly less so than on the prior study. There is no evidence of focal airspace consolidation, edema, pleural effusion, or pneumothorax. No acute osseous abnormality is identified.  IMPRESSION: No evidence of acute airspace disease.   Electronically Signed   By: Logan Bores   On: 09/20/2013 16:30    Scheduled Meds: . aspirin EC  81 mg Oral q morning - 10a  . atenolol  25 mg Oral Daily  . diltiazem  135 mg Oral Q12H  . [START ON 09/22/2013] influenza vac split quadrivalent PF  0.5 mL Intramuscular Tomorrow-1000  . ipratropium-albuterol  3 mL Nebulization TID  . levofloxacin  (LEVAQUIN) IV  750 mg Intravenous Q24H  . levothyroxine  112 mcg Oral QAC breakfast  . methylPREDNISolone (SOLU-MEDROL) injection  60 mg Intravenous Q12H  . oseltamivir  75 mg Oral BID   Continuous Infusions: . sodium chloride 75 mL/hr at 09/20/13 2153    Principal Problem:   Acute respiratory failure with hypoxia Active Problems:   COPD exacerbation   HTN (hypertension)   Unspecified hypothyroidism    Time spent: >35 minutes     Kinnie Feil  [facility] Pager [phone]. If 7PM-7AM, please contact night-coverage at [email or web address], password [facility] 09/21/2013, 12:30 PM  LOS: 1 day

## 2013-09-22 LAB — URINE CULTURE
Colony Count: NO GROWTH
Culture: NO GROWTH

## 2013-09-22 LAB — GLUCOSE, CAPILLARY: Glucose-Capillary: 202 mg/dL — ABNORMAL HIGH (ref 70–99)

## 2013-09-22 MED ORDER — FLUTICASONE-SALMETEROL 500-50 MCG/DOSE IN AEPB: 1.0000 | INHALATION_SPRAY | Freq: Two times a day (BID) | RESPIRATORY_TRACT | Status: DC

## 2013-09-22 MED ORDER — PREDNISONE 10 MG PO TABS: 10.0000 mg | ORAL_TABLET | Freq: Every day | ORAL | Status: DC

## 2013-09-22 MED ORDER — OSELTAMIVIR PHOSPHATE 75 MG PO CAPS: 75.0000 mg | ORAL_CAPSULE | Freq: Two times a day (BID) | ORAL | Status: DC

## 2013-09-22 MED ORDER — LEVOFLOXACIN 500 MG PO TABS: 500.0000 mg | ORAL_TABLET | Freq: Every day | ORAL | Status: DC

## 2013-09-22 MED ORDER — IPRATROPIUM-ALBUTEROL 0.5-2.5 (3) MG/3ML IN SOLN
3.0000 mL | Freq: Three times a day (TID) | RESPIRATORY_TRACT | Status: DC
Start: 2013-09-22 — End: 2013-12-26

## 2013-09-22 MED ORDER — ALBUTEROL SULFATE (2.5 MG/3ML) 0.083% IN NEBU
2.5000 mg | INHALATION_SOLUTION | RESPIRATORY_TRACT | Status: DC | PRN
Start: 2013-09-22 — End: 2015-11-28

## 2013-09-22 NOTE — Progress Notes (Signed)
SATURATION QUALIFICATIONS: (This note is used to comply with regulatory documentation for home oxygen)  Patient Saturations on Room Air at Rest = 90%  Patient Saturations on Room Air while Ambulating = 86%  Patient Saturations on 2 Liters of oxygen while Ambulating = 90%   Please briefly explain why patient needs home oxygen:

## 2013-09-22 NOTE — Discharge Summary (Signed)
Physician Discharge Summary  Cheryl Haley RKY:[id number] DOB: 09-20-1955 DOA: 09/20/2013  PCP: Gennette Pac, MD  Admit date: 09/20/2013 Discharge date: 09/22/2013  Time spent: >35 minutes  Recommendations for Outpatient Follow-up:  F/u with PCP in 1 week as needed Discharge Diagnoses:  Principal Problem:   Acute respiratory failure with hypoxia Active Problems:   COPD exacerbation   HTN (hypertension)   Unspecified hypothyroidism   Discharge Condition: stable  Diet recommendation: heart healthy   Filed Weights   09/20/13 2158 09/21/13 0216 09/22/13 0613  Weight: 45.3 kg (99 lb 13.9 oz) 45.3 kg (99 lb 13.9 oz) 44.634 kg (98 lb 6.4 oz)    History of present illness:  58 year old female with past medical history of COPD, HTN, levothyroxine who presented to [facility] ED 09/20/2013 with worsening shortness of breath, productive cough and fever for past couple of days.   Hospital Course: Principal Problem:  Acute respiratory failure with hypoxia due to COPD/emphysema +influenza - likely due to COPD exacerbation - improved on bronchodilators, atx, steroids, tamilfu; cont OP monitor  Active Problems:  HTN (hypertension)  - continue atenolol and cardizem  Unspecified hypothyroidism  - continue levothyroxine     Procedures:  None  (i.e. Studies not automatically included, echos, thoracentesis, etc; not x-rays)  Consultations:  None   Discharge Exam: Filed Vitals:   09/22/13 0613  BP: 115/59  Pulse: 88  Temp: 98.4 F (36.9 C)  Resp: 18    General: alert Cardiovascular: s1,s2 rr Respiratory: ventilation improved   Discharge Instructions  Discharge Orders   Future Orders Complete By Expires   Diet - low sodium heart healthy  As directed    Increase activity slowly  As directed        Medication List         albuterol (2.5 MG/3ML) 0.083% nebulizer solution  Commonly known as:  PROVENTIL  Take 3 mLs (2.5 mg total) by nebulization every 4 (four) hours as  needed for wheezing or shortness of breath.     aspirin EC 81 MG tablet  Take 81 mg by mouth every morning.     atenolol 25 MG tablet  Commonly known as:  TENORMIN  Take 25 mg by mouth daily.     diltiazem 90 MG 12 hr capsule  Commonly known as:  CARDIZEM SR  Take 135 mg by mouth 2 (two) times daily. Takes 1.5 tabs twice daily     Fluticasone-Salmeterol 500-50 MCG/DOSE Aepb  Commonly known as:  ADVAIR DISKUS  Inhale 1 puff into the lungs 2 (two) times daily.     ipratropium-albuterol 0.5-2.5 (3) MG/3ML Soln  Commonly known as:  DUONEB  Take 3 mLs by nebulization 3 (three) times daily.     levofloxacin 500 MG tablet  Commonly known as:  LEVAQUIN  Take 1 tablet (500 mg total) by mouth daily.     levothyroxine 112 MCG tablet  Commonly known as:  SYNTHROID, LEVOTHROID  Take 112 mcg by mouth daily before breakfast.     oseltamivir 75 MG capsule  Commonly known as:  TAMIFLU  Take 1 capsule (75 mg total) by mouth 2 (two) times daily.     predniSONE 10 MG tablet  Commonly known as:  DELTASONE  Take 1 tablet (10 mg total) by mouth daily.       No Known Allergies     Follow-up Information   Follow up with Gennette Pac, MD.   Specialty:  Family Medicine   Contact information:   [address]  [place] [address] [phone]       Follow up with [facility]. (if your oxygen drops into the 80's at home, return to the ED)    Specialty:  Emergency Medicine   Contact information:   [address] [id number] [place] [address] [phone]      Follow up with Gennette Pac, MD In 1 week.   Specialty:  Family Medicine   Contact information:   [place] [address] [phone]        The results of significant diagnostics from this hospitalization (including imaging, microbiology, ancillary and laboratory) are listed below for reference.    Significant Diagnostic Studies: Dg  Chest Port 1 View  09/20/2013   CLINICAL DATA:  Shortness of breath and cough.  COPD.  EXAM: PORTABLE CHEST - 1 VIEW  COMPARISON:  05/26/2005  FINDINGS: The cardiomediastinal silhouette is within normal limits. Thoracic aortic calcification is noted. The lungs are hyperinflated, slightly less so than on the prior study. There is no evidence of focal airspace consolidation, edema, pleural effusion, or pneumothorax. No acute osseous abnormality is identified.  IMPRESSION: No evidence of acute airspace disease.   Electronically Signed   By: Logan Bores   On: 09/20/2013 16:30    Microbiology: Recent Results (from the past 240 hour(s))  URINE CULTURE     Status: None   Collection Time    09/20/13 10:01 PM      Result Value Range Status   Specimen Description URINE, RANDOM   Final   Special Requests Immunocompromised   Final   Culture  Setup Time     Final   Value: 09/20/2013 22:24     Performed at [facility]     Final   Value: NO GROWTH     Performed at [facility]   Culture     Final   Value: NO GROWTH     Performed at [facility]   Report Status 09/22/2013 FINAL   Final     Labs: Basic Metabolic Panel:  Recent Labs Lab 09/20/13 1640 09/21/13 0637  NA 137 136*  K 4.5 4.5  CL 94* 97  CO2 25 25  GLUCOSE 100* 133*  BUN 19 11  CREATININE 0.67 0.43*  CALCIUM 9.3 8.5   Liver Function Tests:  Recent Labs Lab 09/21/13 0637  AST 26  ALT 13  ALKPHOS 52  BILITOT <0.2*  PROT 7.5  ALBUMIN 3.7   No results found for this basename: LIPASE, AMYLASE,  in the last 168 hours No results found for this basename: AMMONIA,  in the last 168 hours CBC:  Recent Labs Lab 09/20/13 1640 09/21/13 0637  WBC 5.4 4.2  NEUTROABS 4.6  --   HGB 15.1* 12.9  HCT 43.4 37.2  MCV 90.6 89.9  PLT 305 260   Cardiac Enzymes: No results found for this basename: CKTOTAL, CKMB, CKMBINDEX, TROPONINI,  in the last 168 hours BNP: BNP (last 3  results)  Recent Labs  09/20/13 1640  PROBNP 256.1*   CBG:  Recent Labs Lab 09/21/13 0750 09/22/13 0757  GLUCAP 132* 202*       Signed:  Rowe Clack N  [facility] 09/22/2013, 9:19 AM

## 2013-09-22 NOTE — Clinical Documentation Improvement (Signed)
Possible Clinical Conditions?  Severe Malnutrition   Severe Protein Calorie Malnutrition Other Condition Cannot clinically determine   Risk Factors: Patient meets criteria for Severe Malnutrition in the context of chronic illness as evidenced by severe fat and muscle mass loss per 01/21 evaluation of RD.  Diagnostics: Ht: 5'4"      Wt: 99 lb 13.9 oz.      BMI: 17.13. Treatment: 01/21: Add Ensure complete po BID.  Thank You, Theron Arista, Clinical Documentation Specialist:  [phone]  [place] Information Management

## 2013-09-22 NOTE — Evaluation (Addendum)
Occupational Therapy Evaluation and Discharge Patient Details Name: Cheryl Haley MRN: [id number] DOB: [birth year] Today's Date: 09/22/2013 Time: 4401-0272 OT Time Calculation (min): 28 min  OT Assessment / Plan / Recommendation History of present illness 58 year old female with past medical history of COPD, HTN, levothyroxine who presented to [facility] ED 09/20/2013 with worsening shortness of breath, productive cough and fever for past couple of days. She felt flu like symptoms, upper respiratory tract congestion and though she would eventually feel better but her symptoms got worse. She had some chest discomfort with cough. No abdominal pain, no nausea or vomiting. No diarrhea or constipation. No blood in stool or urine. No lightheadedness or loss of consciousness.   Clinical Impression   Pt presents to acute OT with conditions listed above. Pt is at supervision level with ADLs for verbal cues for pursed lip breathing technique.  Pt requires no further skilled OT services at this time, acute OT will sign off.    OT Assessment  Patient does not need any further OT services    Follow Up Recommendations  No OT follow up       Equipment Recommendations  None recommended by OT          Precautions / Restrictions Precautions Precautions: None Precaution Comments: Requires O2 during ambulation/activity   Pertinent Vitals/Pain O2 sats - 92% on 2L at rest; 86% on room air during ambulation Pt reports no pain   ADL  Eating/Feeding: Independent Where Assessed - Eating/Feeding: Edge of bed Toilet Transfer: Supervision/safety Toilet Transfer Method: Sit to Loss adjuster, chartered: Regular height toilet Transfers/Ambulation Related to ADLs: Pt's ambulation is currenlty slower than baseline. Pt's O2 drops during activity. ADL Comments: Pt independent in ADLs/IALDs prior to admission. 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  Pt currently requires  supervision for ADL activities, and verbal cueing for breathing strategies.     OT Goals(Current goals can be found in the care plan section) Acute Rehab OT Goals Patient Stated Goal: Need to be Independent so I can help my room mate who is LVAD pt. OT Goal Formulation: With patient  Visit Information  Last OT Received On: 09/22/13 History of Present Illness: 57 year old female with past medical history of COPD, HTN, levothyroxine who presented to [facility] ED 09/20/2013 with worsening shortness of breath, productive cough and fever for past couple of days. She felt flu like symptoms, upper respiratory tract congestion and though she would eventually feel better but her symptoms got worse. She had some chest discomfort with cough. No abdominal pain, no nausea or vomiting. No diarrhea or constipation. No blood in stool or urine. No lightheadedness or loss of consciousness.       Prior [place] expects to be discharged to:: Private residence Living Arrangements: Non-relatives/Friends Available Help at Discharge: Family;Available PRN/intermittently;Other (Comment) Type of Home: House Home Access: Stairs to enter [employer] of Steps: 3 Home Layout: One level Home Equipment: Shower seat;Grab bars - toilet;Grab bars - tub/shower Prior Function Level of Independence: Independent Communication Communication: No difficulties     Vision/Perception Vision - History Baseline Vision: No visual deficits   Cognition  Cognition Arousal/Alertness: Awake/alert Behavior During Therapy: WFL for tasks assessed/performed Overall Cognitive Status: Within Functional Limits for tasks assessed    Extremity/Trunk Assessment Upper Extremity Assessment Upper Extremity Assessment: Overall WFL for tasks assessed (intermittent tremors noted in UE) Lower Extremity Assessment Lower Extremity Assessment: Defer to PT evaluation  Mobility Bed Mobility Overal bed  mobility: Independent Transfers Overall transfer level: Independent Equipment used: None      Balance Balance Overall balance assessment: No apparent balance deficits (not formally assessed)   End of Session OT - End of Session Equipment Utilized During Treatment: Oxygen Activity Tolerance: Patient tolerated treatment well Patient left: in bed  GO     Almon Register [phone] 09/22/2013, 12:16 PM

## 2013-09-22 NOTE — Progress Notes (Signed)
Nsg Discharge Note  Admit Date:  09/20/2013 Discharge date: 09/22/2013   Cheryl Haley to be D/C'd Home per MD order.  AVS completed.  Copy for chart, and copy for patient signed, and dated. Patient/caregiver able to verbalize understanding.  Discharge Medication:   Medication List         albuterol (2.5 MG/3ML) 0.083% nebulizer solution  Commonly known as:  PROVENTIL  Take 3 mLs (2.5 mg total) by nebulization every 4 (four) hours as needed for wheezing or shortness of breath.     aspirin EC 81 MG tablet  Take 81 mg by mouth every morning.     atenolol 25 MG tablet  Commonly known as:  TENORMIN  Take 25 mg by mouth daily.     diltiazem 90 MG 12 hr capsule  Commonly known as:  CARDIZEM SR  Take 135 mg by mouth 2 (two) times daily. Takes 1.5 tabs twice daily     Fluticasone-Salmeterol 500-50 MCG/DOSE Aepb  Commonly known as:  ADVAIR DISKUS  Inhale 1 puff into the lungs 2 (two) times daily.     ipratropium-albuterol 0.5-2.5 (3) MG/3ML Soln  Commonly known as:  DUONEB  Take 3 mLs by nebulization 3 (three) times daily.     levofloxacin 500 MG tablet  Commonly known as:  LEVAQUIN  Take 1 tablet (500 mg total) by mouth daily.     levothyroxine 112 MCG tablet  Commonly known as:  SYNTHROID, LEVOTHROID  Take 112 mcg by mouth daily before breakfast.     oseltamivir 75 MG capsule  Commonly known as:  TAMIFLU  Take 1 capsule (75 mg total) by mouth 2 (two) times daily.     predniSONE 10 MG tablet  Commonly known as:  DELTASONE  Take 1 tablet (10 mg total) by mouth daily.        Discharge Assessment: Filed Vitals:   09/22/13 1713  BP: 125/57  Pulse: 84  Temp: 98.2 F (36.8 C)  Resp: 18   Skin clean, dry and intact without evidence of skin break down, no evidence of skin tears noted. IV catheter discontinued intact. Site without signs and symptoms of complications - no redness or edema noted at insertion site, patient denies c/o pain - only slight tenderness at site.   Dressing with slight pressure applied.  D/c Instructions-Education: Discharge instructions given to patient/family with verbalized understanding. D/c education completed with patient/family including follow up instructions, medication list, d/c activities limitations if indicated, with other d/c instructions as indicated by MD - patient able to verbalize understanding, all questions fully answered. Patient instructed to return to ED, call 911, or call MD for any changes in condition.  Patient escorted via [place], and D/C home via private auto.  Leeandra Ellerson, Elissa Hefty, RN 09/22/2013 5:28 PM

## 2013-09-27 NOTE — ED Provider Notes (Signed)
Medical screening examination/treatment/procedure(s) were conducted as a shared visit with non-physician practitioner(s) and myself.  I personally evaluated the patient during the encounter.  EKG Interpretation    Date/Time:    Ventricular Rate:    PR Interval:    QRS Duration:   QT Interval:    QTC Calculation:   R Axis:     Text Interpretation:              Results for orders placed during the hospital encounter of 09/20/13  URINE CULTURE      Result Value Range   Specimen Description URINE, RANDOM     Special Requests Immunocompromised     Culture  Setup Time       Value: 09/20/2013 22:24     Performed at [facility] Count       Value: NO GROWTH     Performed at [facility]   Culture       Value: NO GROWTH     Performed at [facility]   Report Status 09/22/2013 FINAL    CBC WITH DIFFERENTIAL      Result Value Range   WBC 5.4  4.0 - 10.5 K/uL   RBC 4.79  3.87 - 5.11 MIL/uL   Hemoglobin 15.1 (*) 12.0 - 15.0 g/dL   HCT 43.4  36.0 - 46.0 %   MCV 90.6  78.0 - 100.0 fL   MCH 31.5  26.0 - 34.0 pg   MCHC 34.8  30.0 - 36.0 g/dL   RDW 14.4  11.5 - 15.5 %   Platelets 305  150 - 400 K/uL   Neutrophils Relative % 84 (*) 43 - 77 %   Neutro Abs 4.6  1.7 - 7.7 K/uL   Lymphocytes Relative 9 (*) 12 - 46 %   Lymphs Abs 0.5 (*) 0.7 - 4.0 K/uL   Monocytes Relative 7  3 - 12 %   Monocytes Absolute 0.4  0.1 - 1.0 K/uL   Eosinophils Relative 0  0 - 5 %   Eosinophils Absolute 0.0  0.0 - 0.7 K/uL   Basophils Relative 0  0 - 1 %   Basophils Absolute 0.0  0.0 - 0.1 K/uL  BASIC METABOLIC PANEL      Result Value Range   Sodium 137  137 - 147 mEq/L   Potassium 4.5  3.7 - 5.3 mEq/L   Chloride 94 (*) 96 - 112 mEq/L   CO2 25  19 - 32 mEq/L   Glucose, Bld 100 (*) 70 - 99 mg/dL   BUN 19  6 - 23 mg/dL   Creatinine, Ser 0.67  0.50 - 1.10 mg/dL   Calcium 9.3  8.4 - 10.5 mg/dL   GFR calc non Af Amer >90  >90 mL/min   GFR calc Af Amer >90  >90 mL/min   PRO B NATRIURETIC PEPTIDE      Result Value Range   Pro B Natriuretic peptide (BNP) 256.1 (*) 0 - 125 pg/mL  URINALYSIS, ROUTINE W REFLEX MICROSCOPIC      Result Value Range   Color, Urine AMBER (*) YELLOW   APPearance CLOUDY (*) CLEAR   Specific Gravity, Urine 1.031 (*) 1.005 - 1.030   pH 5.5  5.0 - 8.0   Glucose, UA NEGATIVE  NEGATIVE mg/dL   Hgb urine dipstick NEGATIVE  NEGATIVE   Bilirubin Urine MODERATE (*) NEGATIVE   Ketones, ur 40 (*) NEGATIVE mg/dL   Protein, ur 30 (*) NEGATIVE mg/dL  Urobilinogen, UA 1.0  0.0 - 1.0 mg/dL   Nitrite NEGATIVE  NEGATIVE   Leukocytes, UA SMALL (*) NEGATIVE  URINE MICROSCOPIC-ADD ON      Result Value Range   Squamous Epithelial / LPF FEW (*) RARE   WBC, UA 0-2  <3 WBC/hpf   RBC / HPF 0-2  <3 RBC/hpf   Bacteria, UA FEW (*) RARE   Casts HYALINE CASTS (*) NEGATIVE   Urine-Other MUCOUS PRESENT    COMPREHENSIVE METABOLIC PANEL      Result Value Range   Sodium 136 (*) 137 - 147 mEq/L   Potassium 4.5  3.7 - 5.3 mEq/L   Chloride 97  96 - 112 mEq/L   CO2 25  19 - 32 mEq/L   Glucose, Bld 133 (*) 70 - 99 mg/dL   BUN 11  6 - 23 mg/dL   Creatinine, Ser 0.43 (*) 0.50 - 1.10 mg/dL   Calcium 8.5  8.4 - 10.5 mg/dL   Total Protein 7.5  6.0 - 8.3 g/dL   Albumin 3.7  3.5 - 5.2 g/dL   AST 26  0 - 37 U/L   ALT 13  0 - 35 U/L   Alkaline Phosphatase 52  39 - 117 U/L   Total Bilirubin <0.2 (*) 0.3 - 1.2 mg/dL   GFR calc non Af Amer >90  >90 mL/min   GFR calc Af Amer >90  >90 mL/min  CBC      Result Value Range   WBC 4.2  4.0 - 10.5 K/uL   RBC 4.14  3.87 - 5.11 MIL/uL   Hemoglobin 12.9  12.0 - 15.0 g/dL   HCT 37.2  36.0 - 46.0 %   MCV 89.9  78.0 - 100.0 fL   MCH 31.2  26.0 - 34.0 pg   MCHC 34.7  30.0 - 36.0 g/dL   RDW 14.4  11.5 - 15.5 %   Platelets 260  150 - 400 K/uL  LEGIONELLA ANTIGEN, URINE      Result Value Range   Specimen Description URINE, RANDOM     Special Requests NONE     Legionella Antigen, Urine       Value: Negative for  Legionella pneumophilia serogroup 1     Performed at [facility]   Report Status 09/21/2013 FINAL    STREP PNEUMONIAE URINARY ANTIGEN      Result Value Range   Strep Pneumo Urinary Antigen NEGATIVE  NEGATIVE  INFLUENZA PANEL BY PCR (TYPE A & B, H1N1)      Result Value Range   Influenza A By PCR POSITIVE (*) NEGATIVE   Influenza B By PCR NEGATIVE  NEGATIVE   H1N1 flu by pcr NOT DETECTED  NOT DETECTED  GLUCOSE, CAPILLARY      Result Value Range   Glucose-Capillary 132 (*) 70 - 99 mg/dL  GLUCOSE, CAPILLARY      Result Value Range   Glucose-Capillary 202 (*) 70 - 99 mg/dL   Comment 1 Documented in Chart     Comment 2 Notify RN     Dg Chest Port 1 View  09/20/2013   CLINICAL DATA:  Shortness of breath and cough.  COPD.  EXAM: PORTABLE CHEST - 1 VIEW  COMPARISON:  05/26/2005  FINDINGS: The cardiomediastinal silhouette is within normal limits. Thoracic aortic calcification is noted. The lungs are hyperinflated, slightly less so than on the prior study. There is no evidence of focal airspace consolidation, edema, pleural effusion, or pneumothorax. No acute osseous abnormality is  identified.  IMPRESSION: No evidence of acute airspace disease.   Electronically Signed   By: Logan Bores   On: 09/20/2013 16:30    Patient seen by me most likely COPD exacerbation, CXR negative for pneumonia. Patient presented with hypoxia and with treatments in the ED seemed to improve and at one point was sating in the low 90's on RA but then at discharge dropped her sats to upper 80's. Admission will be required until hypoxia resolved.   Mervin Kung, MD 09/27/13 [phone]

## 2013-12-26 ENCOUNTER — Encounter: Payer: Self-pay | Admitting: Pulmonary Disease

## 2013-12-26 ENCOUNTER — Ambulatory Visit (INDEPENDENT_AMBULATORY_CARE_PROVIDER_SITE_OTHER): Payer: Self-pay | Admitting: Pulmonary Disease

## 2013-12-26 VITALS — BP 108/60 | HR 97 | Temp 98.1°F | Ht 64.0 in | Wt 102.4 lb

## 2013-12-26 DIAGNOSIS — J438 Other emphysema: Secondary | ICD-10-CM

## 2013-12-26 DIAGNOSIS — R0600 Dyspnea, unspecified: Secondary | ICD-10-CM

## 2013-12-26 DIAGNOSIS — J439 Emphysema, unspecified: Secondary | ICD-10-CM

## 2013-12-26 DIAGNOSIS — R0609 Other forms of dyspnea: Secondary | ICD-10-CM

## 2013-12-26 DIAGNOSIS — R0989 Other specified symptoms and signs involving the circulatory and respiratory systems: Secondary | ICD-10-CM

## 2013-12-26 NOTE — Patient Instructions (Addendum)
Continue with the Symbicort 2 puffs twice a day, but will give you a spacer to try and improve deposition.  Will also get you paperwork to apply for patient assistance program. Will give you a trial of Incruse, one inhalation each am.  Please call in 4 weeks with your response to this. You can use nebulizer medication only for rescue/emergencies, and your albuterol inhaler for rescue when away from home.  Consider the pulmonary rehab program as we discussed once you are able to get medicaid or other insurance. Stay as active as possible to improve conditioning. Continue oxygen with sleep.  followup with me again in 35mos, but call if you are having breathing issues.

## 2013-12-26 NOTE — Assessment & Plan Note (Signed)
The patient has severe airflow obstruction by her spirometry today, and when combined with her history, this is most consistent with emphysema. I have had a long discussion with her about the management of emphysema, including the role of maintenance bronchodilators, oxygen, and smoking cessation, as well as pulmonary rehabilitation. Given the severity of her disease, I think she needs to maintain on Symbicort, and will add a different anti-cholinergic from [employer] to see if she sees a difference. I have done ambulatory oximetry today in the office, and she does not desaturate to 88% or less, but I have asked her to continue with oxygen with sleep.  Finally, I have recommended referral to pulmonary rehabilitation, but she does not have any insurance to pay for this.  I have asked her to try and apply for Medicaid, and will try and get her medications for her through a patient assistance program.

## 2013-12-26 NOTE — Progress Notes (Signed)
   Subjective:    Patient ID: Cheryl Haley, female    DOB: 11-15-1955, 58 y.o.   MRN: [id number]  HPI The patient is a 58 year old female who I've been asked to see for management of COPD. The patient has had breathing problems for years, but she has noticed worsening since January of this year. She was hospitalized that month for what sounds like an acute exacerbation, and discharged home on oxygen and nebulized bronchodilators. Over the last few months, she has seen significant worsening in her breathing. She describes shortness of breath walking less than one block, and will also get winded going up a flight of stairs. She will also give short of breath bringing groceries in from the car. She did used to have problems with cough and mucus, but this resolved with tobacco cessation in 2014. She quit using her oxygen soon after discharge from the hospital, and has been back on it during sleep for the last 8 days. She has not been using portable oxygen. She was started on Symbicort recently, but is not sure that she is using it properly. She thinks the medicine is hitting her time most of the time. Her chest x-ray in January of this year showed only hyperinflation and no acute process. She has not had issues with lower extremity edema, nor does she have any significant cardiac history.   Review of Systems  Constitutional: Positive for appetite change. Negative for fever and unexpected weight change.  HENT: Positive for congestion. Negative for dental problem, ear pain, nosebleeds, postnasal drip, rhinorrhea, sinus pressure, sneezing, sore throat and trouble swallowing.   Eyes: Negative for redness and itching.  Respiratory: Positive for cough and shortness of breath. Negative for chest tightness and wheezing.   Cardiovascular: Negative for palpitations and leg swelling.  Gastrointestinal: Negative for nausea and vomiting.  Genitourinary: Negative for dysuria.  Musculoskeletal: Negative for joint  swelling.  Skin: Negative for rash.  Neurological: Negative for headaches.  Hematological: Does not bruise/bleed easily.  Psychiatric/Behavioral: Negative for dysphoric mood. The patient is not nervous/anxious.        Objective:   Physical Exam Constitutional:  Thin female, no acute distress  HENT:  Nares patent without discharge  Oropharynx without exudate, palate and uvula are normal  Eyes:  Perrla, eomi, no scleral icterus  Neck:  No JVD, no TMG  Cardiovascular:  Normal rate, regular rhythm, no rubs or gallops.  No murmurs        Intact distal pulses  Pulmonary :  Very decreased breath sounds, no stridor or respiratory distress   No rales, rhonchi, or wheezing  Abdominal:  Soft, nondistended, bowel sounds present.  No tenderness noted.   Musculoskeletal:  No lower extremity edema noted.  Lymph Nodes:  No cervical lymphadenopathy noted  Skin:  No cyanosis noted  Neurologic:  Alert, appropriate, moves all 4 extremities without obvious deficit.         Assessment & Plan:

## 2014-01-18 ENCOUNTER — Telehealth: Payer: Self-pay | Admitting: Pulmonary Disease

## 2014-01-18 NOTE — Telephone Encounter (Signed)
Spoke with the pt  She is requesting samples of Incruse and Symbicort  Her breathing is much improved and wants to stay on meds  I have left her 1 sample of each and pt assistance forms for each med up front to pick up  I advised that once she is done filling our her part, to bring to [place] and we will go from there  She verbalized understanding  She is asking for handicap placard also  Please advise if you feel that this is appropriate, thanks!

## 2014-01-18 NOTE — Telephone Encounter (Signed)
LMOM x 1 for pt to contact our office Samples up front ready for pick up and handicapped placard form to be signed by [facility] when he returns next week.  Handicapped paper given to [facility] to have [facility] sign when he returns.

## 2014-01-18 NOTE — Telephone Encounter (Signed)
Yes, her lung disease is severe enough for a handicap placard.  But, ask her to rarely use.  Needs to walk the vast majority of the time unless she is having a bad day or if has to park WAY out in the lot.

## 2014-01-20 ENCOUNTER — Telehealth: Payer: Self-pay | Admitting: Pulmonary Disease

## 2014-01-20 NOTE — Telephone Encounter (Signed)
Duplicate message. Jennifer Castillo, CMA  

## 2014-01-20 NOTE — Telephone Encounter (Signed)
Would like Handicap Sticker also

## 2014-01-24 NOTE — Telephone Encounter (Signed)
Form placed in [place] green folder to sign. I will notify the pt once the form is signed. [place] Bing, CMA

## 2014-01-24 NOTE — Telephone Encounter (Signed)
Pt is aware that samples have been placed up front for pick up. Once handicap placard is signed she would like to be called. Will route to [place] so that she may call the pt once this is done.

## 2014-01-25 MED ORDER — BUDESONIDE-FORMOTEROL FUMARATE 160-4.5 MCG/ACT IN AERO: 2.0000 | INHALATION_SPRAY | Freq: Two times a day (BID) | RESPIRATORY_TRACT | Status: DC

## 2014-01-25 MED ORDER — UMECLIDINIUM BROMIDE 62.5 MCG/INH IN AEPB: 1.0000 | INHALATION_SPRAY | Freq: Every day | RESPIRATORY_TRACT | Status: DC

## 2014-01-25 NOTE — Telephone Encounter (Signed)
done

## 2014-01-26 NOTE — Telephone Encounter (Signed)
Handicap placard completed and placed at front. Pt advised. [place] Bing, CMA

## 2014-03-13 ENCOUNTER — Telehealth: Payer: Self-pay | Admitting: Pulmonary Disease

## 2014-03-13 MED ORDER — UMECLIDINIUM BROMIDE 62.5 MCG/INH IN AEPB: 1.0000 | INHALATION_SPRAY | Freq: Every day | RESPIRATORY_TRACT | Status: DC

## 2014-03-13 MED ORDER — BUDESONIDE-FORMOTEROL FUMARATE 160-4.5 MCG/ACT IN AERO
2.0000 | INHALATION_SPRAY | Freq: Two times a day (BID) | RESPIRATORY_TRACT | Status: DC
Start: 2014-03-13 — End: 2015-11-28

## 2014-03-13 NOTE — Telephone Encounter (Signed)
Pt aware of below and samples left for pick up. I called [facility] at [phone]. Spoke with [employer].  Order is pending and pt should receive symbicort in 7-10 business days. Pt is eligible for medicaid per [place] and needs to apply. If denied, they need the denial letter.   I called [employer] [phone] spoke with Estill Bamberg. I was advised they never received her application.  I called spoke with pt and advised her of the above. She will apply for medicaid. I have also refaxed [place] paperwork. Nothing further needed

## 2014-03-13 NOTE — Telephone Encounter (Signed)
Called spoke with pt. She reports she submitted her Patient assistance forms about 6 weeks ago. She is asking for samples of incruse and symbicort until she hears something. Please advise [place] thanks

## 2014-03-13 NOTE — Telephone Encounter (Signed)
Ok to give her one box of each, but we need to call someone to find out the status of her assistance paperwork.

## 2014-04-27 ENCOUNTER — Ambulatory Visit: Payer: Self-pay | Admitting: Pulmonary Disease

## 2014-05-01 ENCOUNTER — Ambulatory Visit: Payer: Self-pay | Admitting: Pulmonary Disease

## 2014-05-03 ENCOUNTER — Encounter (HOSPITAL_COMMUNITY): Payer: Self-pay | Admitting: Emergency Medicine

## 2014-05-03 ENCOUNTER — Emergency Department (HOSPITAL_COMMUNITY): Payer: Self-pay

## 2014-05-03 ENCOUNTER — Emergency Department (HOSPITAL_COMMUNITY)
Admission: EM | Admit: 2014-05-03 | Discharge: 2014-05-03 | Disposition: A | Discharge status: Home / Self Care | Payer: Self-pay | Attending: Emergency Medicine | Admitting: Emergency Medicine

## 2014-05-03 DIAGNOSIS — E871 Hypo-osmolality and hyponatremia: Secondary | ICD-10-CM | POA: Insufficient documentation

## 2014-05-03 DIAGNOSIS — R0602 Shortness of breath: Secondary | ICD-10-CM | POA: Insufficient documentation

## 2014-05-03 DIAGNOSIS — Z87891 Personal history of nicotine dependence: Secondary | ICD-10-CM | POA: Insufficient documentation

## 2014-05-03 DIAGNOSIS — Z8659 Personal history of other mental and behavioral disorders: Secondary | ICD-10-CM | POA: Insufficient documentation

## 2014-05-03 DIAGNOSIS — IMO0002 Reserved for concepts with insufficient information to code with codable children: Secondary | ICD-10-CM | POA: Insufficient documentation

## 2014-05-03 DIAGNOSIS — Z79899 Other long term (current) drug therapy: Secondary | ICD-10-CM | POA: Insufficient documentation

## 2014-05-03 DIAGNOSIS — J441 Chronic obstructive pulmonary disease with (acute) exacerbation: Secondary | ICD-10-CM | POA: Insufficient documentation

## 2014-05-03 DIAGNOSIS — E079 Disorder of thyroid, unspecified: Secondary | ICD-10-CM | POA: Insufficient documentation

## 2014-05-03 DIAGNOSIS — Z7982 Long term (current) use of aspirin: Secondary | ICD-10-CM | POA: Insufficient documentation

## 2014-05-03 DIAGNOSIS — I10 Essential (primary) hypertension: Secondary | ICD-10-CM | POA: Insufficient documentation

## 2014-05-03 DIAGNOSIS — Z8673 Personal history of transient ischemic attack (TIA), and cerebral infarction without residual deficits: Secondary | ICD-10-CM | POA: Insufficient documentation

## 2014-05-03 LAB — BASIC METABOLIC PANEL
ANION GAP: 13 (ref 5–15)
BUN: 13 mg/dL (ref 6–23)
CO2: 27 mEq/L (ref 19–32)
CREATININE: 0.6 mg/dL (ref 0.50–1.10)
Calcium: 9.2 mg/dL (ref 8.4–10.5)
Chloride: 88 mEq/L — ABNORMAL LOW (ref 96–112)
GFR calc non Af Amer: >90 mL/min (ref >90)
Glucose, Bld: 123 mg/dL — ABNORMAL HIGH (ref 70–99)
Potassium: 4.6 mEq/L (ref 3.7–5.3)
Sodium: 128 mEq/L — ABNORMAL LOW (ref 137–147)

## 2014-05-03 LAB — I-STAT TROPONIN, ED: TROPONIN I, POC: 0 ng/mL (ref 0.00–0.08)

## 2014-05-03 LAB — CBC
HEMATOCRIT: 42.5 % (ref 36.0–46.0)
Hemoglobin: 14.9 g/dL (ref 12.0–15.0)
MCH: 30.8 pg (ref 26.0–34.0)
MCHC: 35.1 g/dL (ref 30.0–36.0)
MCV: 88 fL (ref 78.0–100.0)
Platelets: 298 10*3/uL (ref 150–400)
RBC: 4.83 MIL/uL (ref 3.87–5.11)
RDW: 15 % (ref 11.5–15.5)
WBC: 4.6 10*3/uL (ref 4.0–10.5)

## 2014-05-03 MED ORDER — DOXYCYCLINE HYCLATE 100 MG PO CAPS: 100.0000 mg | ORAL_CAPSULE | Freq: Two times a day (BID) | ORAL | Status: DC

## 2014-05-03 MED ORDER — PREDNISONE 50 MG PO TABS: ORAL_TABLET | ORAL | Status: DC

## 2014-05-03 MED ORDER — IPRATROPIUM-ALBUTEROL 0.5-2.5 (3) MG/3ML IN SOLN
3.0000 mL | Freq: Once | RESPIRATORY_TRACT | Status: AC
  Administered 2014-05-03: 3 mL via RESPIRATORY_TRACT
  Filled 2014-05-03: qty 3

## 2014-05-03 MED ORDER — METHYLPREDNISOLONE SODIUM SUCC 125 MG IJ SOLR
80.0000 mg | INTRAMUSCULAR | Status: AC
  Administered 2014-05-03: 80 mg via INTRAVENOUS
  Filled 2014-05-03: qty 2

## 2014-05-03 MED ORDER — SODIUM CHLORIDE 0.9 % IV BOLUS (SEPSIS)
500.0000 mL | Freq: Once | INTRAVENOUS | Status: AC
  Administered 2014-05-03: 500 mL via INTRAVENOUS

## 2014-05-03 NOTE — ED Notes (Signed)
Bed: KQ20 Expected date:  Expected time:  Means of arrival:  Comments: [place], B. [place]

## 2014-05-03 NOTE — ED Notes (Signed)
Pt sent by PCP, Little MD.  C/o SOB and increasing weakness x 1 day.  Denies pain.  Hx of COPD.  Pt had a duoneb at office w/ little improvement.  Pt wears O2 at night.  Pulmonologist: Clamp MD.

## 2014-05-03 NOTE — ED Provider Notes (Signed)
CSN: [id number]     Arrival date & time 05/03/14  1202 History   First MD Initiated Contact with Patient 05/03/14 1230     Chief Complaint  Patient presents with  . Shortness of Breath     (Consider location/radiation/quality/duration/timing/severity/associated sxs/prior Treatment) Patient is a 58 y.o. female presenting with shortness of breath. The history is provided by the patient.  Shortness of Breath Severity:  Mild Onset quality:  Gradual Duration:  3 days Timing:  Constant Progression:  Unchanged Chronicity:  Recurrent Context: URI   Relieved by:  Nothing Worsened by:  Nothing tried Ineffective treatments:  None tried Associated symptoms: cough   Associated symptoms: no abdominal pain, no chest pain, no fever, no headaches, no neck pain and no vomiting     Past Medical History  Diagnosis Date  . Hypertension   . Anxiety   . Thyroid disease   . COPD (chronic obstructive pulmonary disease)   . Hx of TIA (transient ischemic attack) and stroke    History reviewed. No pertinent past surgical history. Family History  Problem Relation Age of Onset  . Emphysema Father   . Emphysema Brother   . Emphysema Sister   . Emphysema Sister   . Heart disease Father   . Heart disease Mother   . Heart disease Sister   . Heart disease Brother   . Heart disease Brother   . Congestive Heart Failure Sister   . Congestive Heart Failure Sister   . Cancer Sister     lung  . Cancer Sister     breast   History  Substance Use Topics  . Smoking status: Former Smoker -- 2.00 packs/day for 40 years    Types: Cigarettes    Quit date: 12/26/2012  . Smokeless tobacco: Never Used  . Alcohol Use: Yes     Comment: occasional   OB History   Grav Para Term Preterm Abortions TAB SAB Ect Mult Living                 Review of Systems  Constitutional: Negative for fever and fatigue.  HENT: Negative for congestion and drooling.   Eyes: Negative for pain.  Respiratory: Positive for  cough and shortness of breath.   Cardiovascular: Negative for chest pain.  Gastrointestinal: Negative for nausea, vomiting, abdominal pain and diarrhea.  Genitourinary: Negative for dysuria and hematuria.  Musculoskeletal: Negative for back pain, gait problem and neck pain.  Skin: Negative for color change.  Neurological: Negative for dizziness and headaches.  Hematological: Negative for adenopathy.  Psychiatric/Behavioral: Negative for behavioral problems.  All other systems reviewed and are negative.     Allergies  Review of patient's allergies indicates no known allergies.  Home Medications   Prior to Admission medications   Medication Sig Start Date End Date Taking? Authorizing Provider  acetaminophen (TYLENOL) 500 MG tablet Take 1,000 mg by mouth once as needed for moderate pain.   Yes Historical Provider, MD  albuterol (PROVENTIL) (2.5 MG/3ML) 0.083% nebulizer solution Take 3 mLs (2.5 mg total) by nebulization every 4 (four) hours as needed for wheezing or shortness of breath. 09/22/13  Yes Kinnie Feil, MD  aspirin EC 81 MG tablet Take 81 mg by mouth every morning.   Yes Historical Provider, MD  atenolol (TENORMIN) 25 MG tablet Take 25 mg by mouth daily.   Yes Historical Provider, MD  budesonide-formoterol (SYMBICORT) 160-4.5 MCG/ACT inhaler Inhale 2 puffs into the lungs 2 (two) times daily. 03/13/14  Yes Armando Reichert  Clance, MD  diltiazem (CARDIZEM SR) 90 MG 12 hr capsule Take 135 mg by mouth 2 (two) times daily. Takes 1.5 tabs twice daily   Yes Historical Provider, MD  levothyroxine (SYNTHROID, LEVOTHROID) 112 MCG tablet Take 112 mcg by mouth daily before breakfast.   Yes Historical Provider, MD  NON FORMULARY Pt wears 02 2lpm continuous.  Is to wear with exertion and at night.   Yes Historical Provider, MD  Umeclidinium Bromide (INCRUSE ELLIPTA) 62.5 MCG/INH AEPB Inhale 1 puff into the lungs daily. 03/13/14  Yes Kathee Delton, MD   BP 145/78  Pulse 79  Temp(Src) 98.7 F (37.1  C) (Oral)  Resp 27  SpO2 94% Physical Exam  Nursing note and vitals reviewed. Constitutional: She is oriented to person, place, and time. She appears well-developed and well-nourished.  HENT:  Head: Normocephalic and atraumatic.  Mouth/Throat: Oropharynx is clear and moist. No oropharyngeal exudate.  Eyes: Conjunctivae and EOM are normal. Pupils are equal, round, and reactive to light.  Neck: Normal range of motion. Neck supple.  Cardiovascular: Normal rate, regular rhythm, normal heart sounds and intact distal pulses.  Exam reveals no gallop and no friction rub.   No murmur heard. Pulmonary/Chest: She is in respiratory distress. She has wheezes.  Mild tachypnea. Diminished breath sounds bilaterally with faint expiratory wheeze heard in the upper lobes.  Abdominal: Soft. Bowel sounds are normal. There is no tenderness. There is no rebound and no guarding.  Musculoskeletal: Normal range of motion. She exhibits no edema and no tenderness.  Neurological: She is alert and oriented to person, place, and time.  Skin: Skin is warm and dry.  Psychiatric: She has a normal mood and affect. Her behavior is normal.    ED Course  Procedures (including critical care time) Labs Review Labs Reviewed  BASIC METABOLIC PANEL - Abnormal; Notable for the following:    Sodium 128 (*)    Chloride 88 (*)    Glucose, Bld 123 (*)    All other components within normal limits  CBC  I-STAT TROPOININ, ED    Imaging Review Dg Chest 2 View  05/03/2014   CLINICAL DATA:  Shortness of breath, congestion, cough, history emphysema/COPD, hypertension  EXAM: CHEST  2 VIEW  COMPARISON:  09/20/2013  FINDINGS: Normal heart size, mediastinal contours, and pulmonary vascularity.  Atherosclerotic calcification aorta.  Emphysematous and bronchitic changes consistent with COPD.  New small RIGHT pleural effusion and basilar atelectasis.  No pulmonary infiltrate or pneumothorax.  Diffuse osseous demineralization.  IMPRESSION:  COPD changes with new RIGHT pleural effusion and minimal basilar atelectasis.   Electronically Signed   By: Lavonia Dana M.D.   On: 05/03/2014 13:33     EKG Interpretation   Date/Time:  Wednesday May 03 2014 12:12:31 EDT Ventricular Rate:  77 PR Interval:  161 QRS Duration: 109 QT Interval:  404 QTC Calculation: 457 R Axis:   92 Text Interpretation:  Sinus rhythm Left atrial enlargement Borderline  right axis deviation No significant change since last tracing Confirmed by  Kalvin Buss  MD, Nely Dedmon ([id number]) on 05/03/2014 3:15:19 PM      MDM   Final diagnoses:  COPD exacerbation  Hyponatremia    12:53 PM 58 y.o. female w hx of HTN, COPD on 2L [place] at night who pw worsening sob over the last 2-3 days. She denies any fevers, chest pain, worsening cough, or increase or change in sputum. She is afebrile and vital signs are unremarkable here. O2 sat is 96% on 2  L nasal cannula. She was seen at her primary care physician's office prior to arrival where she was given a nebulizer treatment and her oxygen saturation remained in the upper 80s. She was also given 8 mg of Decadron IM. Will get screening labs and imaging here. Will give 80 mg Solu-Medrol.  Pt's O2 sat on RA is 89%. O2 sat on 2L O2 via [place] is 96-99%. Upon ambulating, pt's O2 sat remains above 90% on 2L via [place]. As pt cont to appear well w/ dec RR and improved lung sounds, I think it is reasonable to d/c her home. I recommended she use her home O2 continuously rather than just at night. She understands. Will have her return for any worsening.   3:12 PM:  I have discussed the diagnosis/risks/treatment options with the patient and believe the pt to be eligible for discharge home to follow-up with her pcp in 2 days if no better. We also discussed returning to the ED immediately if new or worsening sx occur. We discussed the sx which are most concerning (e.g., worsening sob, fever, cp) that necessitate immediate return. Medications administered  to the patient during their visit and any new prescriptions provided to the patient are listed below.  Medications given during this visit Medications  methylPREDNISolone sodium succinate (SOLU-MEDROL) 125 mg/2 mL injection 80 mg (80 mg Intravenous Given 05/03/14 1302)  ipratropium-albuterol (DUONEB) 0.5-2.5 (3) MG/3ML nebulizer solution 3 mL (3 mLs Nebulization Given 05/03/14 1302)  sodium chloride 0.9 % bolus 500 mL (500 mLs Intravenous New Bag/Given 05/03/14 1343)    New Prescriptions   DOXYCYCLINE (VIBRAMYCIN) 100 MG CAPSULE    Take 1 capsule (100 mg total) by mouth 2 (two) times daily. One po bid x 7 days   PREDNISONE (DELTASONE) 50 MG TABLET    Take 1 tablet by mouth daily for the next 4 days.       Pamella Pert, MD 05/03/14 1515

## 2014-05-03 NOTE — Progress Notes (Signed)
[place] with patient about [employer] and provided her with an application.Also, provided pt with [employer] information.

## 2014-05-15 ENCOUNTER — Encounter: Payer: Self-pay | Admitting: Pulmonary Disease

## 2014-05-15 ENCOUNTER — Ambulatory Visit (INDEPENDENT_AMBULATORY_CARE_PROVIDER_SITE_OTHER): Payer: Self-pay | Admitting: Pulmonary Disease

## 2014-05-15 ENCOUNTER — Ambulatory Visit (INDEPENDENT_AMBULATORY_CARE_PROVIDER_SITE_OTHER)
Admission: RE | Admit: 2014-05-15 | Discharge: 2014-05-15 | Disposition: A | Discharge status: Home / Self Care | Payer: Self-pay | Source: Ambulatory Visit | Attending: Pulmonary Disease | Admitting: Pulmonary Disease

## 2014-05-15 ENCOUNTER — Other Ambulatory Visit: Payer: Self-pay | Admitting: Pulmonary Disease

## 2014-05-15 VITALS — BP 116/62 | HR 90 | Temp 98.2°F | Ht 64.0 in | Wt 102.8 lb

## 2014-05-15 DIAGNOSIS — J439 Emphysema, unspecified: Secondary | ICD-10-CM

## 2014-05-15 DIAGNOSIS — J90 Pleural effusion, not elsewhere classified: Secondary | ICD-10-CM

## 2014-05-15 DIAGNOSIS — J438 Other emphysema: Secondary | ICD-10-CM

## 2014-05-15 HISTORY — DX: Pleural effusion, not elsewhere classified: J90

## 2014-05-15 MED ORDER — IOHEXOL 350 MG/ML SOLN
80.0000 mL | Freq: Once | INTRAVENOUS | Status: AC | PRN
  Administered 2014-05-15: 80 mL via INTRAVENOUS

## 2014-05-15 NOTE — Assessment & Plan Note (Addendum)
The patient has had a recent episode of worsening shortness of breath, but there was nothing to suggest by history a superimposed infection. A chest x-ray in the emergency room showed a new right pleural effusion, and she was having chest discomfort in that area as well. This may simply be a viral process, but we need to exclude the possibility of a pulmonary embolus or left ventricular dysfunction. Will start with a CT angio, and will probably need an echocardiogram as well. I've asked her to stay on her current pulmonary meds, and to work on total smoking cessation.

## 2014-05-15 NOTE — Patient Instructions (Signed)
Will schedule for a scan of your chest to make sure you have not had a blood clot.  Will call you with results. No change in breathing medications. You need to totally stop smoking.  You are almost there, but take the next step. Will arrange for followup with me once the scan is done.

## 2014-05-15 NOTE — Progress Notes (Signed)
   Subjective:    Patient ID: Cheryl Haley, female    DOB: 1956/07/18, 58 y.o.   MRN: [id number]  HPI The patient comes in today for an acute sick visit. She has known severe COPD, and unfortunately continues to smoke. She was tried on incruse at the last visit with her Symbicort, and saw no change in her breathing. She was seen in the in the emergency room one to 2 weeks ago for increased shortness of breath and right posterior chest discomfort. She had an increased cough with very little mucus, and no purulence or fever. A chest x-ray at that time showed a new right pleural effusion but no infiltrate. She was treated as a COPD exacerbation with prednisone and antibiotics and discharged. She comes in today where she is only about 20% better, and has no issues with cough or mucus. She remains very short of breath above her usual baseline. She has not had any lower extremity edema nor has she gone on long trips.   Review of Systems  Constitutional: Negative for fever and unexpected weight change.  HENT: Positive for congestion and postnasal drip. Negative for dental problem, ear pain, nosebleeds, rhinorrhea, sinus pressure, sneezing, sore throat and trouble swallowing.   Eyes: Negative for redness and itching.  Respiratory: Positive for cough, shortness of breath and wheezing. Negative for chest tightness.   Cardiovascular: Negative for palpitations and leg swelling.  Gastrointestinal: Negative for nausea and vomiting.  Genitourinary: Negative for dysuria.  Musculoskeletal: Negative for joint swelling.  Skin: Negative for rash.  Neurological: Negative for headaches.  Hematological: Does not bruise/bleed easily.  Psychiatric/Behavioral: Negative for dysphoric mood. The patient is not nervous/anxious.        Objective:   Physical Exam Thin and frail-appearing female in no acute distress Nose without purulence or discharge noted Neck without lymphadenopathy or thyromegaly Chest with very  diminished breath sounds, no wheezes or crackles Cardiac exam with regular rate and rhythm Lower extremities without edema, no calf tenderness Alert and oriented, moves all 4 extremities.       Assessment & Plan:

## 2014-05-16 ENCOUNTER — Other Ambulatory Visit: Payer: Self-pay | Admitting: Pulmonary Disease

## 2014-05-16 MED ORDER — PREDNISONE 10 MG PO TABS: ORAL_TABLET | ORAL | Status: DC

## 2014-06-05 ENCOUNTER — Ambulatory Visit: Payer: Self-pay | Admitting: Pulmonary Disease

## 2015-01-22 IMAGING — CR DG CHEST 2V
2 series · 2 of 2 positions shown · non-contrast
Comparison: 09/20/2013

CLINICAL DATA: Shortness of breath, congestion, cough, history
emphysema/COPD, hypertension

EXAM:
CHEST  2 VIEW

[w chest pa]
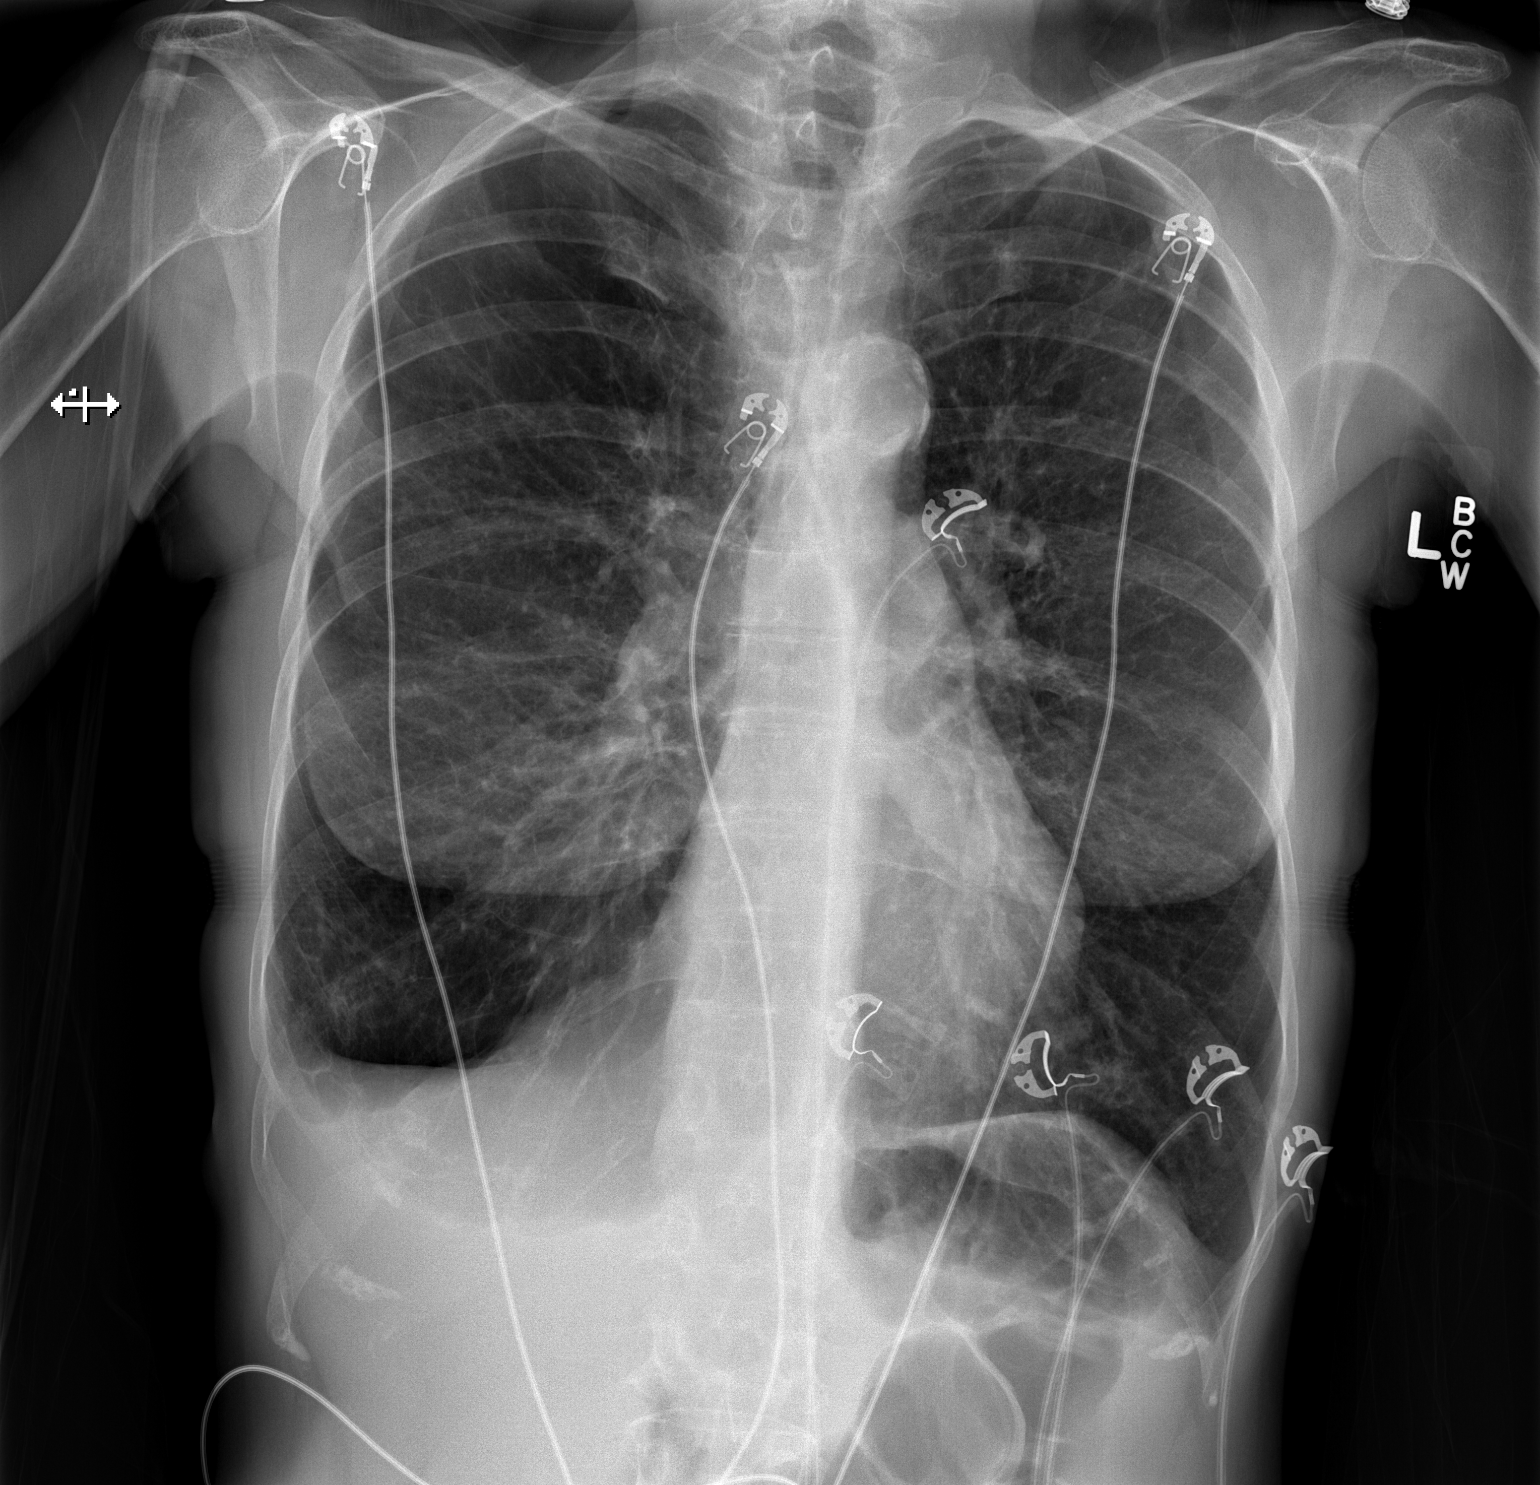

[w chest lat]
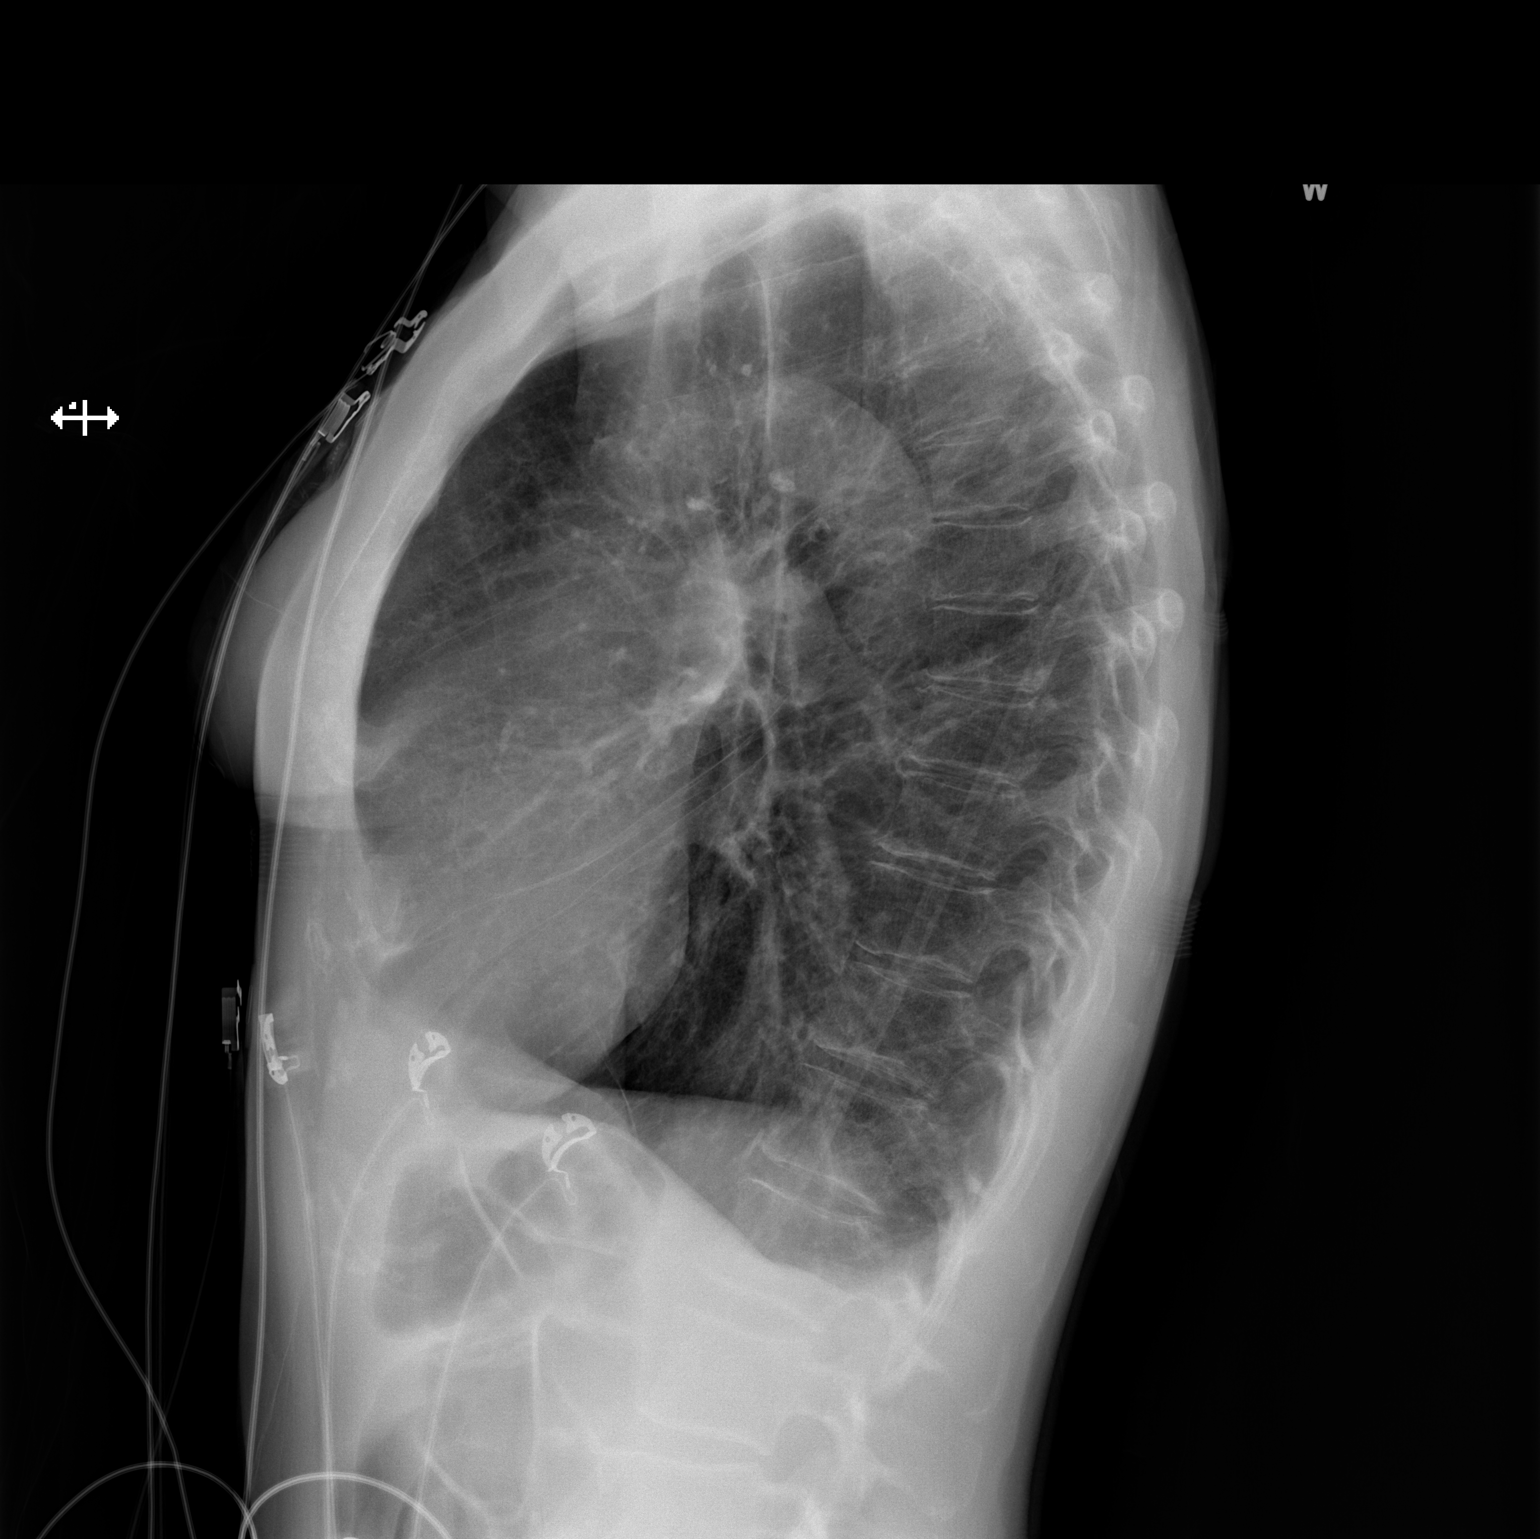

[2 of 2 positions shown; findings below may reference images not displayed]

FINDINGS: Normal heart size, mediastinal contours, and pulmonary vascularity.

Atherosclerotic calcification aorta.

Emphysematous and bronchitic changes consistent with COPD.

New small RIGHT pleural effusion and basilar atelectasis.

No pulmonary infiltrate or pneumothorax.

Diffuse osseous demineralization.
IMPRESSION: COPD changes with new RIGHT pleural effusion and minimal basilar
atelectasis.

## 2015-03-06 ENCOUNTER — Telehealth: Payer: Self-pay | Admitting: Pulmonary Disease

## 2015-03-06 NOTE — Telephone Encounter (Signed)
Called and spoke to pt. Pt stated she needs a refill on Symbicort through [employer] pt assistance program, pt stated it has been a year since her the program started. Advised pt she will need to complete a new pt assistance form. Forms and sample placed up front for pick up. Pt verbalized understanding and aware to return the forms once she has completed them. Nothing further needed at this time.

## 2015-03-08 ENCOUNTER — Telehealth: Payer: Self-pay | Admitting: Pulmonary Disease

## 2015-03-08 NOTE — Telephone Encounter (Signed)
Spoke with pt. Advised her that [facility] was no longer with our practice and she would need to be set up with another MD. She verbalized understanding. OV has been scheduled with BQ on 04/10/14 at 10:45am. Paperwork has been placed up front with the consult sheets under the "10" tab. Nothing further was needed at this time.

## 2015-03-08 NOTE — Telephone Encounter (Signed)
Pt returned call [phone]

## 2015-03-08 NOTE — Telephone Encounter (Signed)
lmtcb x1 for pt. 

## 2015-04-10 ENCOUNTER — Telehealth: Payer: Self-pay | Admitting: Pulmonary Disease

## 2015-04-10 NOTE — Telephone Encounter (Signed)
Patient calling to get samples of Symbicort.  We do not have any in stock.  Patient is concerned because she does not have insurance and cannot afford to buy the symbicort.  She is on the Patient assistance program for the symbicort, but they will not send her any more medication until she is seen in our office by [name] since Dr. Gwenette Greet left.  Patient wants to know if there are any alternative medications she can take until she can get the Symbicort.  [name], please advise.

## 2015-04-10 NOTE — Telephone Encounter (Signed)
I'm OK with a sample and a Symbicort $0 copay coupon, but she has to schedule a f/u appointment

## 2015-04-11 ENCOUNTER — Ambulatory Visit: Payer: Self-pay | Admitting: Pulmonary Disease

## 2015-04-11 NOTE — Telephone Encounter (Signed)
Called pt and 1 sample of symbicort has been left for pick up. Nothing further needed

## 2015-04-17 ENCOUNTER — Ambulatory Visit (INDEPENDENT_AMBULATORY_CARE_PROVIDER_SITE_OTHER): Payer: Self-pay | Admitting: Pulmonary Disease

## 2015-04-17 ENCOUNTER — Encounter: Payer: Self-pay | Admitting: Pulmonary Disease

## 2015-04-17 VITALS — BP 144/72 | HR 93 | Ht 64.0 in | Wt 102.0 lb

## 2015-04-17 DIAGNOSIS — Z23 Encounter for immunization: Secondary | ICD-10-CM

## 2015-04-17 DIAGNOSIS — J439 Emphysema, unspecified: Secondary | ICD-10-CM

## 2015-04-17 NOTE — Assessment & Plan Note (Signed)
She has severe COPD but this has been a stable interval for her. She has done well with Symbicort. She cannot afford it however, so we will apply for financial assistance.  Plan: Prevnar vaccine today Flu shot in the fall Symbicort twice a day Follow-up 6 months

## 2015-04-17 NOTE — Progress Notes (Signed)
   Subjective:    Patient ID: Cheryl Haley, female    DOB: 1956/08/11, 59 y.o.   MRN: [id number]  Synopsis: Former patient of Dr. Gwenette Haley first seen by [facility] in August 2016, she has COPD. Hospitalized for COPD in 09/2013 Former smoker, quit 2016; smoked 2ppd, started as a teenager Cheryl Haley 12/26/13:  FEV1 0.81 (33%), Ratio 41. No change with spiriva or incruse.    HPI Chief Complaint  Patient presents with  . Follow-up    former [facility] pt here for dyspnea.  pt c/o DOE.     She has been taking Symbicort, which helps her quite a bit.  She says that she is out right now for financial reasons.  She has been hospitalized for COPD in January 2015, none since.  No bronchitis since last visit.  She says the hot weather makes her more short of breath, but she is OK when inside. 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   Past Medical History  Diagnosis Date  . Hypertension   . Anxiety   . Thyroid disease   . COPD (chronic obstructive pulmonary disease)   . Hx of TIA (transient ischemic attack) and stroke       Review of Systems     Objective:   Physical Exam Filed Vitals:   04/17/15 1325  BP: 144/72  Pulse: 93  Height: 5\' 4"  (1.626 m)  Weight: 102 lb (46.267 kg)  SpO2: 93%   RA  Gen: well appearing HENT: OP clear, TM's clear, neck supple PULM: poor air movement, few crackles bases CV: RRR, no mgr, trace edema GI: BS+, soft, nontender Derm: no cyanosis or rash Psyche: normal mood and affect   September 2015 CT angiogram chest images personally reviewed showing mild to moderate centrilobular emphysema without evidence of pleural effusion or pulmonary embolism.     Assessment & Plan:  COPD (chronic obstructive pulmonary disease) with emphysema She has severe COPD but this has been a stable interval for her. She has done well with Symbicort. She cannot afford it however, so we will apply for financial assistance.  Plan: Prevnar vaccine today Flu shot in the  fall Symbicort twice a day Follow-up 6 months     Current outpatient prescriptions:  .  albuterol (PROVENTIL) (2.5 MG/3ML) 0.083% nebulizer solution, Take 3 mLs (2.5 mg total) by nebulization every 4 (four) hours as needed for wheezing or shortness of breath., Disp: 75 mL, Rfl: 12 .  aspirin EC 81 MG tablet, Take 81 mg by mouth every morning., Disp: , Rfl:  .  atenolol (TENORMIN) 25 MG tablet, Take 25 mg by mouth daily., Disp: , Rfl:  .  budesonide-formoterol (SYMBICORT) 160-4.5 MCG/ACT inhaler, Inhale 2 puffs into the lungs 2 (two) times daily., Disp: 1 Inhaler, Rfl: 0 .  diltiazem (CARDIZEM SR) 90 MG 12 hr capsule, Take 135 mg by mouth 2 (two) times daily. Takes 1.5 tabs twice daily, Disp: , Rfl:  .  levothyroxine (SYNTHROID, LEVOTHROID) 112 MCG tablet, Take 112 mcg by mouth daily before breakfast., Disp: , Rfl:  .  NON FORMULARY, Pt wears 02 2lpm continuous.  Is to wear with exertion and at night., Disp: , Rfl:

## 2015-04-17 NOTE — Patient Instructions (Signed)
Keep taking your medications as you are doing Keep trying to get insurance We will see you back in 6 months or sooner if needed

## 2015-04-17 NOTE — Addendum Note (Signed)
Addended by: Len Blalock on: 04/17/2015 01:57 PM   Modules accepted: Orders

## 2015-04-24 ENCOUNTER — Telehealth: Payer: Self-pay | Admitting: Pulmonary Disease

## 2015-04-24 NOTE — Telephone Encounter (Signed)
Sample left at front for patient  Patient notified Nothing further needed.

## 2015-04-24 NOTE — Telephone Encounter (Signed)
Patient says she came in last week and received 2 samples of Symbicort and it was supposed to have 60 puffs in it, she only got 24 puffs out of it.  She said she took her last 2 puffs yesterday.  Just started the other one today.  She said that Caryl Pina gave her enough to last until [employer] approves her patient assistance.  She said that she needs more Symbicort because these do not have 60 puffs in them.    We do not have Symbicort samples.  Dr. Melvyn Novas, please advise.

## 2015-04-24 NOTE — Telephone Encounter (Signed)
i have one sample  in my office she can have

## 2015-06-18 ENCOUNTER — Telehealth: Payer: Self-pay | Admitting: Pulmonary Disease

## 2015-06-18 NOTE — Telephone Encounter (Signed)
I called spoke with pt. Aware we currently do not have any samples. She verbalized understanding and needed nothing further

## 2015-11-28 ENCOUNTER — Encounter: Payer: Self-pay | Admitting: Pulmonary Disease

## 2015-11-28 ENCOUNTER — Ambulatory Visit (INDEPENDENT_AMBULATORY_CARE_PROVIDER_SITE_OTHER): Payer: Medicaid Other | Admitting: Pulmonary Disease

## 2015-11-28 ENCOUNTER — Telehealth: Payer: Self-pay | Admitting: Pulmonary Disease

## 2015-11-28 VITALS — BP 126/70 | HR 66 | Ht 64.0 in | Wt 104.0 lb

## 2015-11-28 DIAGNOSIS — Z87891 Personal history of nicotine dependence: Secondary | ICD-10-CM | POA: Diagnosis not present

## 2015-11-28 DIAGNOSIS — J439 Emphysema, unspecified: Secondary | ICD-10-CM

## 2015-11-28 DIAGNOSIS — Z720 Tobacco use: Secondary | ICD-10-CM | POA: Diagnosis not present

## 2015-11-28 HISTORY — DX: Tobacco use: Z72.0

## 2015-11-28 MED ORDER — BUDESONIDE-FORMOTEROL FUMARATE 160-4.5 MCG/ACT IN AERO: 2.0000 | INHALATION_SPRAY | Freq: Two times a day (BID) | RESPIRATORY_TRACT | Status: DC

## 2015-11-28 MED ORDER — IPRATROPIUM BROMIDE HFA 17 MCG/ACT IN AERS: 2.0000 | INHALATION_SPRAY | Freq: Four times a day (QID) | RESPIRATORY_TRACT | Status: DC | PRN

## 2015-11-28 NOTE — Assessment & Plan Note (Signed)
Smoked 2 packs of cigarettes daily from teenage years until age 60, quit in 2016  Plan: Referral placed to lung cancer screening program

## 2015-11-28 NOTE — Progress Notes (Signed)
   Subjective:    Patient ID: Cheryl Haley, female    DOB: 02/20/1956, 60 y.o.   MRN: [id number]  Synopsis: Former patient of Dr. Gwenette Haley first seen by [facility] in August 2016, she has COPD. Hospitalized for COPD in 09/2013 Former smoker, quit 2016; smoked 2ppd, started as a teenager Cheryl Haley 12/26/13:  FEV1 0.81 (33%), Ratio 41. No change with spiriva or incruse.    HPI Chief Complaint  Patient presents with  . Follow-up    pt c/o sob with exertion-states this is stable X3 years.      [place] has been doing OK. Carrying groceries, cleaning the house and walking fast is hard. She has to pace herself but she can do it. She is on Medicaid now.  She wants to know if she can get Symbicort.  No doctor's visits for bronchitis or COPD flare up.   Past Medical History  Diagnosis Date  . Hypertension   . Anxiety   . Thyroid disease   . COPD (chronic obstructive pulmonary disease) ([place])   . Hx of TIA (transient ischemic attack) and stroke       Review of Systems     Objective:   Physical Exam Filed Vitals:   11/28/15 0927  BP: 126/70  Pulse: 66  Height: 5\' 4"  (1.626 m)  Weight: 104 lb (47.174 kg)  SpO2: 97%   RA  Gen: well appearing HENT: OP clear, TM's clear, neck supple PULM: poor air movement, few crackles R base CV: RRR, no mgr, trace edema GI: BS+, soft, nontender Derm: no cyanosis or rash Psyche: normal mood and affect        Assessment & Plan:  COPD (chronic obstructive pulmonary disease) with emphysema ([place]) Despite having severe airflow obstruction this is been a stable interval for Cheryl Haley. She has not had any exacerbations. She continues to do well with Symbicort. Fortunately, this medication is covered on the [facility] formulary.  Plan: Immunizations are up-to-date at this time, I recommended getting another flu shot in the fall of 2017 Continue Symbicort 2 puffs twice a day Use Atrovent as needed for shortness of breath, she says this works dramatically  better than albuterol Follow-up one year or sooner if needed  Tobacco abuse Smoked 2 packs of cigarettes daily from teenage years until age 29, quit in 2016  Plan: Referral placed to lung cancer screening program     Current outpatient prescriptions:  .  aspirin EC 81 MG tablet, Take 81 mg by mouth every morning., Disp: , Rfl:  .  atenolol (TENORMIN) 25 MG tablet, Take 25 mg by mouth daily., Disp: , Rfl:  .  budesonide-formoterol (SYMBICORT) 160-4.5 MCG/ACT inhaler, Inhale 2 puffs into the lungs 2 (two) times daily., Disp: 1 Inhaler, Rfl: 11 .  diltiazem (CARDIZEM SR) 90 MG 12 hr capsule, Take 135 mg by mouth 2 (two) times daily. Takes 1.5 tabs twice daily, Disp: , Rfl:  .  levothyroxine (SYNTHROID, LEVOTHROID) 112 MCG tablet, Take 112 mcg by mouth daily before breakfast., Disp: , Rfl:  .  NON FORMULARY, Pt wears 02 2lpm continuous.  Is to wear with exertion and at night., Disp: , Rfl:  .  ipratropium (ATROVENT HFA) 17 MCG/ACT inhaler, Inhale 2 puffs into the lungs every 6 (six) hours as needed for wheezing., Disp: 1 Inhaler, Rfl: 12

## 2015-11-28 NOTE — Patient Instructions (Signed)
Keep taking Symbicort 2 puffs twice a day no matter how you feel Use Atrovent as needed for shortness of breath We will get you in contact with our lung cancer screening program We will see you back in one year or sooner if needed

## 2015-11-28 NOTE — Assessment & Plan Note (Signed)
Despite having severe airflow obstruction this is been a stable interval for [place]. She has not had any exacerbations. She continues to do well with Symbicort. Fortunately, this medication is covered on the [facility] formulary.  Plan: Immunizations are up-to-date at this time, I recommended getting another flu shot in the fall of 2017 Continue Symbicort 2 puffs twice a day Use Atrovent as needed for shortness of breath, she says this works dramatically better than albuterol Follow-up one year or sooner if needed

## 2015-11-28 NOTE — Telephone Encounter (Signed)
Called spoke with pt. Aware symbicort sent to the pharmacy. Nothing further needed

## 2015-11-30 ENCOUNTER — Telehealth: Payer: Self-pay | Admitting: Pulmonary Disease

## 2015-11-30 NOTE — Telephone Encounter (Signed)
Initiated PA for Symbicort via telephone:  [place] [employer] [phone] Spoke with Altha Harm @ [place] Approved: 11/30/15-11/25/17 PA [id number]  [place] and made them aware that Symbicort has been approved.+ Nothing further needed.

## 2016-02-08 ENCOUNTER — Telehealth: Payer: Self-pay | Admitting: Pulmonary Disease

## 2016-02-08 DIAGNOSIS — J438 Other emphysema: Secondary | ICD-10-CM

## 2016-02-08 NOTE — Telephone Encounter (Signed)
Spoke with pt. States that she is having an issue with her oxygen. She is currently using oxygen only at night time. Pt has lots of tanks that she is not using and is wanting them picked up.  BQ - can we place this order? Thanks.

## 2016-02-09 NOTE — Telephone Encounter (Signed)
OK by me 

## 2016-02-11 NOTE — Telephone Encounter (Signed)
Pt aware and order sent to [facility] Nothing further needed

## 2016-02-12 ENCOUNTER — Telehealth: Payer: Self-pay | Admitting: Pulmonary Disease

## 2016-02-12 NOTE — Telephone Encounter (Signed)
Looks as though order was placed on 02-11-16 to have [place] pick up tanks. Pt is aware that I am working with [facility] to get this taken care of. Melissa from [facility] was contacted and she will personally speak with patient to see if patient is going to continue using O2 nocturnal only and make sure she understands she has to have  "back-up" tanks for power outages,etc. Melissa will contact our office if other orders,etc will be needed. Thanks.

## 2016-02-13 ENCOUNTER — Telehealth: Payer: Self-pay | Admitting: Pulmonary Disease

## 2016-02-13 NOTE — Telephone Encounter (Signed)
lmtcb X1 for pt  

## 2016-02-14 NOTE — Telephone Encounter (Signed)
Called and spoke with pt. States that she no longer needs to speak to [place]. Nothing further was needed.

## 2016-05-14 ENCOUNTER — Telehealth: Payer: Self-pay | Admitting: Pulmonary Disease

## 2016-05-14 NOTE — Telephone Encounter (Signed)
Patient calling to get appointment for o2 qualification.  Scheduled appointment on 10/10.  Patient aware of appointment Nothing further needed.

## 2016-06-10 ENCOUNTER — Ambulatory Visit (INDEPENDENT_AMBULATORY_CARE_PROVIDER_SITE_OTHER): Payer: Medicaid Other | Admitting: Pulmonary Disease

## 2016-06-10 ENCOUNTER — Encounter: Payer: Self-pay | Admitting: Pulmonary Disease

## 2016-06-10 DIAGNOSIS — Z23 Encounter for immunization: Secondary | ICD-10-CM

## 2016-06-10 DIAGNOSIS — J9611 Chronic respiratory failure with hypoxia: Secondary | ICD-10-CM

## 2016-06-10 DIAGNOSIS — J432 Centrilobular emphysema: Secondary | ICD-10-CM

## 2016-06-10 DIAGNOSIS — Z720 Tobacco use: Secondary | ICD-10-CM | POA: Diagnosis not present

## 2016-06-10 NOTE — Assessment & Plan Note (Signed)
This has been a stable interval for her. She has not had an exacerbation since the last visit.  Plan: Continue Symbicort Flu shot today Follow-up 6 months

## 2016-06-10 NOTE — Progress Notes (Signed)
Subjective:    Patient ID: Cheryl Haley, female    DOB: [birth year], 60 y.o.   MRN: [id number]  Synopsis: Former patient of Dr. Gwenette Greet first seen by [facility] in August 2016, she has COPD. Hospitalized for COPD in 09/2013 Former smoker, quit 2016; smoked 2ppd, started as a teenager Cheryl Haley 12/26/13:  FEV1 0.81 (33%), Ratio 41. No change with spiriva or incruse.    HPI Chief Complaint  Patient presents with  . Follow-up    rov per [facility] for 02 qualification. Pt wears 02 qhs and sometimes with exertion at home.  pt has no breathing complaints currently.     Cheryl Haley is here today to have a qualifying walk test. She continues to use and benefit from her oxygen which is mostly at night with sleep and more in the hot weather and when she is sick.  She used it a month ago when she was sick a month ago, no treatment needed though.   She will still smoke one cigarette every couple of months.    She is still taking Symbicort regularly.   Past Medical History:  Diagnosis Date  . Anxiety   . COPD (chronic obstructive pulmonary disease) ([place])   . Hx of TIA (transient ischemic attack) and stroke   . Hypertension   . Thyroid disease       Review of Systems  Constitutional: Negative for chills, fatigue and fever.  HENT: Negative for postnasal drip, rhinorrhea and sinus pressure.   Respiratory: Negative for choking, shortness of breath and wheezing.   Cardiovascular: Negative for chest pain, palpitations and leg swelling.       Objective:   Physical Exam Vitals:   06/10/16 1007  BP: 124/66  Pulse: 68  SpO2: 97%  Weight: 94 lb 9.6 oz (42.9 kg)  Height: 5\' 4"  (1.626 m)   Ambulated 500 feet on RA, O2 saturation remained above 92%  Gen: well appearing HENT: OP clear, TM's clear, neck supple PULM: poor air movement, no wheezing CV: RRR, no mgr, trace edema GI: BS+, soft, nontender Derm: no cyanosis or rash Psyche: normal mood and affect        Assessment & Plan:  COPD  (chronic obstructive pulmonary disease) with emphysema (HCC) This has been a stable interval for her. She has not had an exacerbation since the last visit.  Plan: Continue Symbicort Flu shot today Follow-up 6 months  Tobacco abuse Encouraged to stay off of cigarettes altogether. Enroll in lung cancer screening program at age 78.  Chronic respiratory failure with hypoxia (HCC) She continues to use and benefit from her oxygen at night and with exertion when she's feeling poorly.  Because she recently changed insurers we will check her ambulatory oximetry today in the office and then arrange for an overnight oximetry test to see if she still qualifies for oxygen.  > 50% of today's 27 minute visit spent face to face   Current Outpatient Prescriptions:  .  aspirin EC 81 MG tablet, Take 81 mg by mouth every morning., Disp: , Rfl:  .  atenolol (TENORMIN) 25 MG tablet, Take 25 mg by mouth daily., Disp: , Rfl:  .  budesonide-formoterol (SYMBICORT) 160-4.5 MCG/ACT inhaler, Inhale 2 puffs into the lungs 2 (two) times daily., Disp: 1 Inhaler, Rfl: 11 .  diltiazem (CARDIZEM SR) 90 MG 12 hr capsule, Take 135 mg by mouth 2 (two) times daily. Takes 1.5 tabs twice daily, Disp: , Rfl:  .  ipratropium (ATROVENT HFA) 17 MCG/ACT inhaler, Inhale 2  puffs into the lungs every 6 (six) hours as needed for wheezing., Disp: 1 Inhaler, Rfl: 12 .  levothyroxine (SYNTHROID, LEVOTHROID) 112 MCG tablet, Take 112 mcg by mouth daily before breakfast., Disp: , Rfl:  .  NON FORMULARY, Pt wears 02 2lpm continuous.  Is to wear with exertion and at night., Disp: , Rfl:

## 2016-06-10 NOTE — Patient Instructions (Signed)
We will arrange for an overnight oxygen testing your home Keep taking Symbicort as you're doing We will see you back in 6 months or sooner if needed

## 2016-06-10 NOTE — Assessment & Plan Note (Signed)
She continues to use and benefit from her oxygen at night and with exertion when she's feeling poorly.  Because she recently changed insurers we will check her ambulatory oximetry today in the office and then arrange for an overnight oximetry test to see if she still qualifies for oxygen.

## 2016-06-10 NOTE — Assessment & Plan Note (Signed)
Encouraged to stay off of cigarettes altogether. Enroll in lung cancer screening program at age 60.

## 2016-10-23 ENCOUNTER — Other Ambulatory Visit: Payer: Self-pay | Admitting: Pulmonary Disease

## 2016-12-08 ENCOUNTER — Ambulatory Visit (INDEPENDENT_AMBULATORY_CARE_PROVIDER_SITE_OTHER)
Admission: RE | Admit: 2016-12-08 | Discharge: 2016-12-08 | Disposition: A | Discharge status: Home / Self Care | Payer: Medicaid Other | Source: Ambulatory Visit | Attending: Pulmonary Disease | Admitting: Pulmonary Disease

## 2016-12-08 ENCOUNTER — Ambulatory Visit (INDEPENDENT_AMBULATORY_CARE_PROVIDER_SITE_OTHER): Payer: Medicaid Other | Admitting: Pulmonary Disease

## 2016-12-08 ENCOUNTER — Encounter: Payer: Self-pay | Admitting: Pulmonary Disease

## 2016-12-08 DIAGNOSIS — Z720 Tobacco use: Secondary | ICD-10-CM | POA: Diagnosis not present

## 2016-12-08 DIAGNOSIS — J9611 Chronic respiratory failure with hypoxia: Secondary | ICD-10-CM

## 2016-12-08 DIAGNOSIS — J432 Centrilobular emphysema: Secondary | ICD-10-CM | POA: Diagnosis not present

## 2016-12-08 DIAGNOSIS — R0689 Other abnormalities of breathing: Secondary | ICD-10-CM | POA: Diagnosis not present

## 2016-12-08 DIAGNOSIS — Z23 Encounter for immunization: Secondary | ICD-10-CM

## 2016-12-08 MED ORDER — FLUTICASONE FUROATE-VILANTEROL 100-25 MCG/INH IN AEPB: 1.0000 | INHALATION_SPRAY | Freq: Every day | RESPIRATORY_TRACT | 0 refills | Status: DC

## 2016-12-08 NOTE — Addendum Note (Signed)
Addended by: Tyson Dense on: 12/08/2016 02:05 PM   Modules accepted: Orders

## 2016-12-08 NOTE — Assessment & Plan Note (Signed)
Today on exam she has diminished breath sounds on the right compared to left, it's not clear to me if this is significant considering her stable symptoms. However, given her smoking history we will check a chest x-ray to ensure there is nothing else worrisome going on.

## 2016-12-08 NOTE — Patient Instructions (Signed)
We will call you with the results of the chest x-ray Stop Symbicort Take Breo 1 puff daily no matter how you feel, call me if the sample helps and we will change your prescription We will see you back in 6 months or sooner if needed

## 2016-12-08 NOTE — Assessment & Plan Note (Signed)
Counseled to quit smoking today per

## 2016-12-08 NOTE — Assessment & Plan Note (Signed)
Continue 2L qHS and exertion

## 2016-12-08 NOTE — Assessment & Plan Note (Signed)
This has been a stable interval for her but unfortunately she is still smoking cigarettes. She was counseled at length to quit smoking today. She would like to try a simpler inhaler regimen.  Plan: Breo 1 puff daily Samples given of this medicine, she was counseled to call us if it's helpful Continue Symbicort if Breo unhelpful, she knows to hold this medicine while taking Breo Follow-up 6 months or sooner if needed

## 2016-12-08 NOTE — Progress Notes (Signed)
Subjective:    Patient ID: Cheryl Haley, female    DOB: Nov 09, 1955, 61 y.o.   MRN: [id number]  Synopsis: Former patient of Dr. Gwenette Greet first seen by [facility] in August 2016, she has COPD. Hospitalized for COPD in 09/2013 Cigarette smoker, cut back in 2016, still smoking 2 a month as of 2018; smoked 2ppd, started as a teenager Cheryl Haley 12/26/13:  FEV1 0.81 (33%), Ratio 41. No change with spiriva or incruse.    HPI Chief Complaint  Patient presents with  . Follow-up    Pt breathing is okay; just SOB with over excertion. Denies cough and chest tightness. DId not have ONO done    Cheryl Haley didn't get the flu this year even though she was around a lot of sick people. She has dyspnea on exertion when she is cleaning the house like mopping the floors. No cough, chest pain and no wheezing. She is still taking Symbicort 2 puffs twice a day.  Would like to try something simpler and she has been frustrated that she feels that her last several canisters of symbicort.   She is still smoking cigarettes occasionally, 2 a month.  Past Medical History:  Diagnosis Date  . Anxiety   . COPD (chronic obstructive pulmonary disease) ([place])   . Hx of TIA (transient ischemic attack) and stroke   . Hypertension   . Thyroid disease       Review of Systems  Constitutional: Negative for chills, fatigue and fever.  HENT: Negative for postnasal drip, rhinorrhea and sinus pressure.   Respiratory: Negative for choking, shortness of breath and wheezing.   Cardiovascular: Negative for chest pain, palpitations and leg swelling.       Objective:   Physical Exam Vitals:   12/08/16 0958  BP: 140/70  Pulse: 75  SpO2: 95%  Weight: 104 lb 12.8 oz (47.5 kg)  Height: 5\' 4"  (1.626 m)    Gen: well appearing HENT: OP clear, TM's clear, neck supple PULM: Diminished breath sounds R lung, clear on left B, normal percussion CV: RRR, no mgr, trace edema GI: BS+, soft, nontender Derm: no cyanosis or rash Psyche:  normal mood and affect  CBC    Component Value Date/Time   WBC 4.6 05/03/2014 1229   RBC 4.83 05/03/2014 1229   HGB 14.9 05/03/2014 1229   HCT 42.5 05/03/2014 1229   PLT 298 05/03/2014 1229   MCV 88.0 05/03/2014 1229   MCH 30.8 05/03/2014 1229   MCHC 35.1 05/03/2014 1229   RDW 15.0 05/03/2014 1229   LYMPHSABS 0.5 (L) 09/20/2013 1640   MONOABS 0.4 09/20/2013 1640   EOSABS 0.0 09/20/2013 1640   BASOSABS 0.0 09/20/2013 1640    05/2014 CXR showed a small R pleural effusion, images independently reviewed     Assessment & Plan:  Chronic respiratory failure with hypoxia (HCC) Continue 2L qHS and exertion  COPD (chronic obstructive pulmonary disease) with emphysema ([place]) This has been a stable interval for her but unfortunately she is still smoking cigarettes. She was counseled at length to quit smoking today. She would like to try a simpler inhaler regimen.  Plan: Breo 1 puff daily Samples given of this medicine, she was counseled to call us if it's helpful Continue Symbicort if Breo unhelpful, she knows to hold this medicine while taking Breo Follow-up 6 months or sooner if needed  Tobacco abuse Counseled to quit smoking today per  Abnormal breath sounds Today on exam she has diminished breath sounds on the right compared to left,  it's not clear to me if this is significant considering her stable symptoms. However, given her smoking history we will check a chest x-ray to ensure there is nothing else worrisome going on.     Current Outpatient Prescriptions:  .  aspirin EC 81 MG tablet, Take 81 mg by mouth every morning., Disp: , Rfl:  .  atenolol (TENORMIN) 25 MG tablet, Take 25 mg by mouth daily., Disp: , Rfl:  .  diltiazem (CARDIZEM SR) 90 MG 12 hr capsule, Take 135 mg by mouth 2 (two) times daily. Takes 1.5 tabs twice daily, Disp: , Rfl:  .  ipratropium (ATROVENT HFA) 17 MCG/ACT inhaler, Inhale 2 puffs into the lungs every 6 (six) hours as needed for wheezing., Disp: 1  Inhaler, Rfl: 12 .  levothyroxine (SYNTHROID, LEVOTHROID) 112 MCG tablet, Take 112 mcg by mouth daily before breakfast., Disp: , Rfl:  .  SYMBICORT 160-4.5 MCG/ACT inhaler, INHALE TWO PUFFS BY MOUTH TWICE DAILY, Disp: 1 Inhaler, Rfl: 11 .  NON FORMULARY, Pt wears 02 2lpm continuous.  Is to wear with exertion and at night., Disp: , Rfl:

## 2016-12-08 NOTE — Progress Notes (Signed)
Patient seen in the office today and instructed on use of Breo 100.  Patient expressed understanding and demonstrated technique. 

## 2016-12-09 ENCOUNTER — Telehealth: Payer: Self-pay | Admitting: Pulmonary Disease

## 2016-12-09 NOTE — Telephone Encounter (Signed)
lmom tcb x1   Looks like pt was returning ashley's call for xray results

## 2016-12-10 NOTE — Telephone Encounter (Signed)
Patient returning call - she can be reached at [phone] -pr

## 2016-12-10 NOTE — Telephone Encounter (Signed)
Patient returning call- she can be reached at [phone] -pr

## 2016-12-10 NOTE — Telephone Encounter (Signed)
Notes recorded by Juanito Doom, MD on 12/08/2016 at 2:50 PM EDT A, Please let the patient know this was OK Thanks, B  Details       lmtcb x2 for pt.

## 2016-12-10 NOTE — Telephone Encounter (Signed)
Spoke with pt and informed her of her results per BQ, pt understood and had no further questions. Nothing further is needed

## 2016-12-10 NOTE — Telephone Encounter (Signed)
lmtcb for pt.  

## 2016-12-15 ENCOUNTER — Other Ambulatory Visit: Payer: Self-pay | Admitting: Pulmonary Disease

## 2016-12-16 ENCOUNTER — Telehealth: Payer: Self-pay | Admitting: Pulmonary Disease

## 2016-12-16 MED ORDER — FLUTICASONE FUROATE-VILANTEROL 100-25 MCG/INH IN AEPB: 1.0000 | INHALATION_SPRAY | Freq: Every day | RESPIRATORY_TRACT | 5 refills | Status: DC

## 2016-12-16 NOTE — Telephone Encounter (Signed)
Spoke with pt and verified which pharmacy that this rx needed to be sent to. The rx was sent. She had no further questions.  Nothing further is needed.

## 2016-12-17 ENCOUNTER — Telehealth: Payer: Self-pay

## 2016-12-17 NOTE — Telephone Encounter (Signed)
Started PA on Breo 100-25 through [employer]  And the PA had to go through clinical review and we can check on status within 24 hours.   PA# [id number]

## 2016-12-19 NOTE — Telephone Encounter (Signed)
Called to check on the status of the PA for the BREO---this has been approved through 4/7/202 and the pharmacy and the pt is aware.

## 2016-12-19 NOTE — Telephone Encounter (Signed)
Pt calling to check on the status of PA.Cheryl Haley

## 2017-06-17 ENCOUNTER — Ambulatory Visit: Payer: Medicaid Other | Admitting: Pulmonary Disease

## 2017-06-25 ENCOUNTER — Other Ambulatory Visit (INDEPENDENT_AMBULATORY_CARE_PROVIDER_SITE_OTHER): Payer: Medicaid Other

## 2017-06-25 ENCOUNTER — Ambulatory Visit (INDEPENDENT_AMBULATORY_CARE_PROVIDER_SITE_OTHER)
Admission: RE | Admit: 2017-06-25 | Discharge: 2017-06-25 | Disposition: A | Discharge status: Home / Self Care | Payer: Medicaid Other | Source: Ambulatory Visit | Attending: Pulmonary Disease | Admitting: Pulmonary Disease

## 2017-06-25 ENCOUNTER — Telehealth: Payer: Self-pay

## 2017-06-25 ENCOUNTER — Ambulatory Visit (INDEPENDENT_AMBULATORY_CARE_PROVIDER_SITE_OTHER): Payer: Medicaid Other | Admitting: Pulmonary Disease

## 2017-06-25 ENCOUNTER — Encounter: Payer: Self-pay | Admitting: Pulmonary Disease

## 2017-06-25 VITALS — BP 142/70 | HR 68 | Ht 64.0 in | Wt 103.0 lb

## 2017-06-25 DIAGNOSIS — Z23 Encounter for immunization: Secondary | ICD-10-CM | POA: Diagnosis not present

## 2017-06-25 DIAGNOSIS — J432 Centrilobular emphysema: Secondary | ICD-10-CM

## 2017-06-25 DIAGNOSIS — Z720 Tobacco use: Secondary | ICD-10-CM | POA: Diagnosis not present

## 2017-06-25 DIAGNOSIS — J189 Pneumonia, unspecified organism: Secondary | ICD-10-CM

## 2017-06-25 DIAGNOSIS — J181 Lobar pneumonia, unspecified organism: Secondary | ICD-10-CM | POA: Diagnosis not present

## 2017-06-25 DIAGNOSIS — R911 Solitary pulmonary nodule: Secondary | ICD-10-CM

## 2017-06-25 LAB — CBC WITH DIFFERENTIAL/PLATELET
BASOS PCT: 0.8 % (ref 0.0–3.0)
Basophils Absolute: 0 10*3/uL (ref 0.0–0.1)
EOS PCT: 1.3 % (ref 0.0–5.0)
Eosinophils Absolute: 0.1 10*3/uL (ref 0.0–0.7)
HEMATOCRIT: 43.3 % (ref 36.0–46.0)
HEMOGLOBIN: 14.4 g/dL (ref 12.0–15.0)
Lymphocytes Relative: 30.4 % (ref 12.0–46.0)
Lymphs Abs: 2 10*3/uL (ref 0.7–4.0)
MCHC: 33.2 g/dL (ref 30.0–36.0)
MCV: 91.8 fl (ref 78.0–100.0)
Monocytes Absolute: 0.6 10*3/uL (ref 0.1–1.0)
Monocytes Relative: 8.8 % (ref 3.0–12.0)
Neutro Abs: 3.8 10*3/uL (ref 1.4–7.7)
Neutrophils Relative %: 58.7 % (ref 43.0–77.0)
Platelets: 472 10*3/uL — ABNORMAL HIGH (ref 150.0–400.0)
RBC: 4.72 Mil/uL (ref 3.87–5.11)
RDW: 17.2 % — ABNORMAL HIGH (ref 11.5–15.5)
WBC: 6.5 10*3/uL (ref 4.0–10.5)

## 2017-06-25 MED ORDER — BUDESONIDE-FORMOTEROL FUMARATE 160-4.5 MCG/ACT IN AERO: 2.0000 | INHALATION_SPRAY | Freq: Two times a day (BID) | RESPIRATORY_TRACT | 0 refills | Status: DC

## 2017-06-25 MED ORDER — AEROCHAMBER MV MISC: 0 refills | Status: DC

## 2017-06-25 NOTE — Progress Notes (Signed)
Subjective:    Patient ID: Cheryl Haley, female    DOB: August 09, 1956, 61 y.o.   MRN: [id number]  Synopsis: Former patient of Dr. Gwenette Haley first seen by [facility] in August 2016, she has COPD. Hospitalized for COPD in 09/2013 Cigarette smoker, cut back in 2016, quit in 2018 after smoking 40 pack years  No change with spiriva or incruse.    HPI Chief Complaint  Patient presents with  . Follow-up    pt states she is doing well, stopped smoking 3 mos ago.  does note stable DOE.     Cheryl Haley had pneumonia back in August after a PCP visit.   > she went to urgent care and she was treated with a zpack and prednisone > she felt better right away > she had a CXR > she feels better now  COPD > she doesn't feel like the Memory Dance is going in or helping her > she likes HFA inhalers better > she is using atrovent 1-2 times per week  No cough right now.  Some mild dyspnea with carrying groceries.   Quit smoking in 2018.   Past Medical History:  Diagnosis Date  . Anxiety   . COPD (chronic obstructive pulmonary disease) ([place])   . Hx of TIA (transient ischemic attack) and stroke   . Hypertension   . Thyroid disease       Review of Systems  Constitutional: Negative for chills, fatigue and fever.  HENT: Negative for postnasal drip, rhinorrhea and sinus pressure.   Respiratory: Negative for choking, shortness of breath and wheezing.   Cardiovascular: Negative for chest pain, palpitations and leg swelling.       Objective:   Physical Exam Vitals:   06/25/17 0901  BP: (!) 142/70  Pulse: 68  SpO2: 97%  Weight: 103 lb (46.7 kg)  Height: 5\' 4"  (1.626 m)    Gen: well appearing HENT: OP clear, TM's clear, neck supple PULM: Poor air movement B, normal percussion CV: RRR, no mgr, trace edema GI: BS+, soft, nontender Derm: no cyanosis or rash Psyche: normal mood and affect   PFT: [name] 12/26/13:  FEV1 0.81 (33%), Ratio 41.  Chest imaging: 05/2014 CXR showed a small R pleural  effusion, images independently reviewed August 2018 chest x-[facility] personally reviewed showing patchy infiltrates in the right upper lobe, question small right lower lobe effusion   CBC    Component Value Date/Time   WBC 4.6 05/03/2014 1229   RBC 4.83 05/03/2014 1229   HGB 14.9 05/03/2014 1229   HCT 42.5 05/03/2014 1229   PLT 298 05/03/2014 1229   MCV 88.0 05/03/2014 1229   MCH 30.8 05/03/2014 1229   MCHC 35.1 05/03/2014 1229   RDW 15.0 05/03/2014 1229   LYMPHSABS 0.5 (L) 09/20/2013 1640   MONOABS 0.4 09/20/2013 1640   EOSABS 0.0 09/20/2013 1640   BASOSABS 0.0 09/20/2013 1640        Assessment & Plan:   Centrilobular emphysema ([place]) - Plan: DG Chest 2 View, CBC w/Diff, IgE  Tobacco abuse  Community acquired pneumonia of right upper lobe of lung ([place])  Discussion: Jannae had pneumonia back in August so we need to get a chest x-ray to show that there has been resolution of this.  I congratulated her on quitting smoking today.  She states that she does much better when she is on prednisone so this makes me wonder if she has an allergic phenotype.  We will check a serum CBC with  differential and serum IgE today to evaluate.  These show evidence of eosinophilia or high serum IgE we may want to pursue an allergy evaluation.  She also notes that Memory Dance does not work well for her so we will change her back to high-dose Symbicort.  I congratulated her on quitting smoking.  She will need to start lung cancer screening in 3 years.  She needs a flu shot today.  Plan: Recent community-acquired pneumonia: We will repeat a chest x-ray today  COPD: Flu shot today Stop Breo Start taking Symbicort 2 puffs twice a day 160/4.5 Keep using Atrovent as needed for shortness of breath   Cigarette smoker: Congratulations on quitting smoking this year When he turns 7 we will start lung cancer screening  Recent COPD exacerbation: We will check a CBC with  differential and serum IgE to evaluate for allergy  We will see you back in 4-6 weeks with a nurse practitioner to see how you are doing and go over the blood work.    Current Outpatient Prescriptions:  .  aspirin EC 81 MG tablet, Take 81 mg by mouth every morning., Disp: , Rfl:  .  atenolol (TENORMIN) 25 MG tablet, Take 25 mg by mouth daily., Disp: , Rfl:  .  ATROVENT HFA 17 MCG/ACT inhaler, INHALE TWO PUFFS BY MOUTH EVERY 6 HOURS AS NEEDED FOR WHEEZING, Disp: 1 Inhaler, Rfl: 5 .  diltiazem (CARDIZEM SR) 90 MG 12 hr capsule, Take 135 mg by mouth 2 (two) times daily. Takes 1.5 tabs twice daily, Disp: , Rfl:  .  fluticasone furoate-vilanterol (BREO ELLIPTA) 100-25 MCG/INH AEPB, Inhale 1 puff into the lungs daily., Disp: 30 each, Rfl: 5 .  levothyroxine (SYNTHROID, LEVOTHROID) 112 MCG tablet, Take 112 mcg by mouth daily before breakfast., Disp: , Rfl:  .  NON FORMULARY, Pt wears 02 2lpm continuous.  Is to wear with exertion and at night., Disp: , Rfl:

## 2017-06-25 NOTE — Telephone Encounter (Signed)
Study Result   CLINICAL DATA:  Emphysema, former smoker, prior pneumonia, routine followup  EXAM: CHEST  2 VIEW  COMPARISON:  12/08/2016  FINDINGS: Normal heart size, mediastinal contours, and pulmonary vascularity.  Atherosclerotic calcifications aorta.  Emphysematous and bronchitic changes consistent with COPD.  Focus of scarring at lateral RIGHT lung base unchanged.  Question new nodular density LEFT perihilar 13 x  No acute infiltrate, pleural effusion or pneumothorax.  Bones demineralized.  10 mm diameter  IMPRESSION: COPD changes with RIGHT basilar scarring and question new 13 x 10 mm LEFT perihilar nodular density ; new pulmonary nodule/tumor is not excluded and further assessment by CT imaging recommended.  These results will be called to the ordering clinician or representative by the Radiologist Assistant, and communication documented in the PACS or zVision Dashboard.   Electronically Signed   By: Lavonia Dana M.D.   On: 06/25/2017 09:52   Call report

## 2017-06-25 NOTE — Patient Instructions (Signed)
Recent community-acquired pneumonia: We will repeat a chest x-ray today  COPD: Flu shot today Stop Breo Start taking Symbicort 2 puffs twice a day 160/4.5 Keep using Atrovent as needed for shortness of breath   Cigarette smoker: Congratulations on quitting smoking this year When he turns 14 we will start lung cancer screening  Recent COPD exacerbation: We will check a CBC with differential and serum IgE to evaluate for allergy  We will see you back in 4-6 weeks with a nurse practitioner to see how you are doing and go over the blood work.

## 2017-06-26 ENCOUNTER — Other Ambulatory Visit: Payer: Self-pay | Admitting: Pulmonary Disease

## 2017-06-26 LAB — IGE: IgE (Immunoglobulin E), Serum: 8 kU/L (ref <=114)

## 2017-06-26 NOTE — Telephone Encounter (Signed)
LMTCB and will hold in triage 

## 2017-06-26 NOTE — Telephone Encounter (Signed)
Spoke with patient. She is aware of BQ's recs. Will go ahead and place order. She verbalized understanding. Nothing else needed at time of call.

## 2017-06-26 NOTE — Telephone Encounter (Signed)
I believe I sent a note to [place] about this. However as she is out can someone call the patient to tell her I want her to have a CT chest without contrast to evaluate the nodule.  Please order CT.

## 2017-07-02 ENCOUNTER — Telehealth: Payer: Self-pay | Admitting: Pulmonary Disease

## 2017-07-02 ENCOUNTER — Inpatient Hospital Stay: Admission: RE | Admit: 2017-07-02 | Payer: Medicaid Other | Source: Ambulatory Visit

## 2017-07-02 ENCOUNTER — Other Ambulatory Visit: Payer: Medicaid Other

## 2017-07-02 ENCOUNTER — Ambulatory Visit (INDEPENDENT_AMBULATORY_CARE_PROVIDER_SITE_OTHER)
Admission: RE | Admit: 2017-07-02 | Discharge: 2017-07-02 | Disposition: A | Discharge status: Home / Self Care | Payer: Medicaid Other | Source: Ambulatory Visit | Attending: Pulmonary Disease | Admitting: Pulmonary Disease

## 2017-07-02 DIAGNOSIS — R911 Solitary pulmonary nodule: Secondary | ICD-10-CM

## 2017-07-02 DIAGNOSIS — R918 Other nonspecific abnormal finding of lung field: Secondary | ICD-10-CM

## 2017-07-02 NOTE — Telephone Encounter (Signed)
PET has been ordered.  Spoke with Erline Levine at [place] [facility] who will have a Super-D disk sent to [place] here at the office.  Nothing further needed at this time.

## 2017-07-02 NOTE — Telephone Encounter (Signed)
I called Cheryl Haley to discuss the results of her CXR that showed two persistent nodules.  One of the lesions was in the RLL where she had pneumonia, but the other was in the LUL.  Bother are worrisome as they are bigger than 1 cm and spiculated.    She understands  Plan: PET CT Send images from this CT for Super D images. Will cc triage to help with these orders and to coordinate with the patient

## 2017-07-14 ENCOUNTER — Encounter (HOSPITAL_COMMUNITY)
Admission: RE | Admit: 2017-07-14 | Discharge: 2017-07-14 | Disposition: A | Discharge status: Home / Self Care | Payer: Medicaid Other | Source: Ambulatory Visit | Attending: Pulmonary Disease | Admitting: Pulmonary Disease

## 2017-07-14 DIAGNOSIS — R918 Other nonspecific abnormal finding of lung field: Secondary | ICD-10-CM

## 2017-07-14 DIAGNOSIS — R911 Solitary pulmonary nodule: Secondary | ICD-10-CM | POA: Diagnosis present

## 2017-07-14 LAB — GLUCOSE, CAPILLARY: GLUCOSE-CAPILLARY: 107 mg/dL — AB (ref 65–99)

## 2017-07-14 MED ORDER — FLUDEOXYGLUCOSE F - 18 (FDG) INJECTION
4.9000 | Freq: Once | INTRAVENOUS | Status: AC | PRN
  Administered 2017-07-14: 4.9 via INTRAVENOUS

## 2017-07-15 ENCOUNTER — Telehealth: Payer: Self-pay | Admitting: Pulmonary Disease

## 2017-07-15 DIAGNOSIS — R918 Other nonspecific abnormal finding of lung field: Secondary | ICD-10-CM

## 2017-07-15 NOTE — Telephone Encounter (Signed)
I called Cheryl Haley to discuss the results of the CT Scan and PET scan which showed that the 1cm nodule is worrisome for malignancy.  She understands this given her smoking history.  I explained that given her low FEV1 she would not be a candidate for surgical resection.  We have believe that the best approach would be to perform a navigational bronchoscopy.  She voiced understanding when I explained this to her today.  We will start to make arrangements for this.

## 2017-07-15 NOTE — Telephone Encounter (Signed)
Order for ENB has been placed per BQ.

## 2017-07-15 NOTE — Telephone Encounter (Signed)
Spoke to pt she is aware that enb will be 07/29/17@10 :45am Cheryl Haley

## 2017-07-21 ENCOUNTER — Telehealth: Payer: Self-pay | Admitting: Pulmonary Disease

## 2017-07-21 MED ORDER — BUDESONIDE-FORMOTEROL FUMARATE 160-4.5 MCG/ACT IN AERO: 2.0000 | INHALATION_SPRAY | Freq: Two times a day (BID) | RESPIRATORY_TRACT | 1 refills | Status: DC

## 2017-07-21 NOTE — Telephone Encounter (Signed)
Spoke with pt. States that she does not think she needs to keep her ROV with SG on 07/27/17. Per BQ's AVS from 06/25/17, he wanted her to follow up to go over labs and to see how she was feeling. Pt has already been made aware of her lab results and she states that she is doing fine at this time. ROV has been canceled per the pt's request. While speaking to her, she request that I send in a prescription for Symbicort as she is almost out of samples. Rx has been sent. Nothing further was needed.

## 2017-07-27 ENCOUNTER — Encounter (HOSPITAL_COMMUNITY): Payer: Self-pay

## 2017-07-27 ENCOUNTER — Ambulatory Visit: Payer: Medicaid Other | Admitting: Acute Care

## 2017-07-27 ENCOUNTER — Telehealth: Payer: Self-pay | Admitting: Emergency Medicine

## 2017-07-27 NOTE — Telephone Encounter (Signed)
Spoke with Baxter Flattery at [place]. Advised her that RB was out of the office today and would not be back until tomorrow. Patient is scheduled to have a bronch on 11/28 and her pre-op appointment is tomorrow. They are requesting orders.   Will route to RB to he is aware.

## 2017-07-27 NOTE — Pre-Procedure Instructions (Signed)
Cheryl Haley  07/27/2017      [place], [address] [place] [address] Phone: [phone] Fax: [phone]    Your procedure is scheduled on 07/29/2017.  Report to [facility] Admitting at [place].  Call this number if you have problems the morning of surgery:  [phone]   Remember:  Do not eat food or drink liquids after midnight.  Take these medicines the morning of surgery with A SIP OF WATER: Atrovent inhaler - if needed (bring with you to the hospital) Symbicort inhaler Diltiazem (Cardizem) Levothyroxine (Synthroid) Metoprolol Succinate (Toprol XL)   7 days prior to surgery STOP taking any Aspirin(unless otherwise instructed by your surgeon), Aleve, Naproxen, Ibuprofen, Motrin, Advil, Goody's, BC's, all herbal medications, fish oil, and all vitamins  Follow your doctors instructions regarding your Aspirin.  If no instructions were given by your doctor, then you will need to call the prescribing office office to get instructions.      Do not wear jewelry, make-up or nail polish.  Do not wear lotions, powders, or perfumes, or deodorant.  Do not shave 48 hours prior to surgery.   Do not bring valuables to the hospital.  [facility] is not responsible for any belongings or valuables.  Contacts, eyeglasses, dentures or bridgework may not be worn into surgery.  Leave your suitcase in the car.  After surgery it may be brought to your room.  For patients admitted to the hospital, discharge time will be determined by your treatment team.  Patients discharged the day of surgery will not be allowed to drive home.   Name and phone number of your driver:    Special instructions:   [facility]- Preparing For Surgery  Before surgery, you can play an important role. Because skin is not sterile, your skin needs to be as free of germs as possible. You can reduce the number of germs on  your skin by washing with CHG (chlorahexidine gluconate) Soap before surgery.  CHG is an antiseptic cleaner which kills germs and bonds with the skin to continue killing germs even after washing.  Please do not use if you have an allergy to CHG or antibacterial soaps. If your skin becomes reddened/irritated stop using the CHG.  Do not shave (including legs and underarms) for at least 48 hours prior to first CHG shower. It is OK to shave your face.  Please follow these instructions carefully.   1. Shower the NIGHT BEFORE SURGERY and the MORNING OF SURGERY with CHG.   2. If you chose to wash your hair, wash your hair first as usual with your normal shampoo.  3. After you shampoo, rinse your hair and body thoroughly to remove the shampoo.  4. Use CHG as you would any other liquid soap. You can apply CHG directly to the skin and wash gently with a scrungie or a clean washcloth.   5. Apply the CHG Soap to your body ONLY FROM THE NECK DOWN.  Do not use on open wounds or open sores. Avoid contact with your eyes, ears, mouth and genitals (private parts). Wash Face and genitals (private parts)  with your normal soap.  6. Wash thoroughly, paying special attention to the area where your surgery will be performed.  7. Thoroughly rinse your body with warm water from the neck down.  8. DO NOT shower/wash with your normal soap after using and rinsing  off the CHG Soap.  9. Pat yourself dry with a CLEAN TOWEL.  10. Wear CLEAN PAJAMAS to bed the night before surgery, wear comfortable clothes the morning of surgery  11. Place CLEAN SHEETS on your bed the night of your first shower and DO NOT SLEEP WITH PETS.    Day of Surgery: Shower as stated above. Do not apply any deodorants/lotions. Please wear clean clothes to the hospital/surgery center.      Please read over the following fact sheets that you were given.

## 2017-07-28 ENCOUNTER — Other Ambulatory Visit: Payer: Self-pay | Admitting: Emergency Medicine

## 2017-07-28 ENCOUNTER — Encounter (HOSPITAL_COMMUNITY)
Admission: RE | Admit: 2017-07-28 | Discharge: 2017-07-28 | Disposition: A | Discharge status: Home / Self Care | Payer: Medicaid Other | Source: Ambulatory Visit | Attending: Emergency Medicine | Admitting: Emergency Medicine

## 2017-07-28 ENCOUNTER — Other Ambulatory Visit: Payer: Self-pay

## 2017-07-28 ENCOUNTER — Encounter (HOSPITAL_COMMUNITY): Payer: Self-pay

## 2017-07-28 DIAGNOSIS — Z01818 Encounter for other preprocedural examination: Secondary | ICD-10-CM | POA: Diagnosis not present

## 2017-07-28 DIAGNOSIS — Z01812 Encounter for preprocedural laboratory examination: Secondary | ICD-10-CM | POA: Diagnosis not present

## 2017-07-28 HISTORY — DX: Hypothyroidism, unspecified: E03.9

## 2017-07-28 HISTORY — DX: Pneumonia, unspecified organism: J18.9

## 2017-07-28 HISTORY — DX: Personal history of other diseases of the digestive system: Z87.19

## 2017-07-28 HISTORY — DX: Unspecified osteoarthritis, unspecified site: M19.90

## 2017-07-28 LAB — BASIC METABOLIC PANEL
Anion gap: 9 (ref 5–15)
BUN: 9 mg/dL (ref 6–20)
CALCIUM: 9.2 mg/dL (ref 8.9–10.3)
CO2: 27 mmol/L (ref 22–32)
CREATININE: 0.68 mg/dL (ref 0.44–1.00)
Chloride: 99 mmol/L — ABNORMAL LOW (ref 101–111)
Glucose, Bld: 108 mg/dL — ABNORMAL HIGH (ref 65–99)
Potassium: 3.8 mmol/L (ref 3.5–5.1)
SODIUM: 135 mmol/L (ref 135–145)

## 2017-07-28 LAB — CBC
HCT: 44.1 % (ref 36.0–46.0)
Hemoglobin: 14.8 g/dL (ref 12.0–15.0)
MCH: 30.2 pg (ref 26.0–34.0)
MCHC: 33.6 g/dL (ref 30.0–36.0)
MCV: 90 fL (ref 78.0–100.0)
PLATELETS: 400 10*3/uL (ref 150–400)
RBC: 4.9 MIL/uL (ref 3.87–5.11)
RDW: 15.7 % — ABNORMAL HIGH (ref 11.5–15.5)
WBC: 6.3 10*3/uL (ref 4.0–10.5)

## 2017-07-28 NOTE — Anesthesia Preprocedure Evaluation (Addendum)
Anesthesia Evaluation  Patient identified by MRN, date of birth, ID band Patient awake    Reviewed: Allergy & Precautions, NPO status , Patient's Chart, lab work & pertinent test results, reviewed documented beta blocker date and time   Airway Mallampati: II  TM Distance: >3 FB Neck ROM: Full    Dental  (+) Dental Advisory Given   Pulmonary COPD, former smoker,  Lung nodule   breath sounds clear to auscultation       Cardiovascular hypertension, Pt. on medications and Pt. on home beta blockers  Rhythm:Regular Rate:Normal     Neuro/Psych negative neurological ROS     GI/Hepatic Neg liver ROS, hiatal hernia,   Endo/Other  Hypothyroidism   Renal/GU negative Renal ROS     Musculoskeletal   Abdominal   Peds  Hematology negative hematology ROS (+)   Anesthesia Other Findings   Reproductive/Obstetrics                            Lab Results  Component Value Date   WBC 6.3 07/28/2017   HGB 14.8 07/28/2017   HCT 44.1 07/28/2017   MCV 90.0 07/28/2017   PLT 400 07/28/2017   Lab Results  Component Value Date   CREATININE 0.68 07/28/2017   BUN 9 07/28/2017   NA 135 07/28/2017   K 3.8 07/28/2017   CL 99 (L) 07/28/2017   CO2 27 07/28/2017    Anesthesia Physical Anesthesia Plan  ASA: III  Anesthesia Plan: General   Post-op Pain Management:    Induction: Intravenous  PONV Risk Score and Plan: 3 and Ondansetron, Dexamethasone, Treatment may vary due to age or medical condition and Midazolam  Airway Management Planned: Oral ETT  Additional Equipment:   Intra-op Plan:   Post-operative Plan: Extubation in OR  Informed Consent: I have reviewed the patients History and Physical, chart, labs and discussed the procedure including the risks, benefits and alternatives for the proposed anesthesia with the patient or authorized representative who has indicated his/her understanding and  acceptance.   Dental advisory given  Plan Discussed with: CRNA  Anesthesia Plan Comments:        Anesthesia Quick Evaluation

## 2017-07-28 NOTE — Telephone Encounter (Signed)
Orders entered

## 2017-07-28 NOTE — Progress Notes (Signed)
PCP - Dr. Rex Kras Cardiologist - patient denies  Chest x-ray - 06/25/2017 EKG - 07/28/2017 Stress Test - 02/11/2007 ECHO - patient denies Cardiac Cath - patient denies  Sleep Study - patient denies   Aspirin Instructions: Patient did not receive any instructions from her surgeon regarding asa, told her to hold today for surgery tomorrow.  Anesthesia review: n/a  Patient denies shortness of breath, fever, cough and chest pain at PAT appointment   Patient verbalized understanding of instructions that were given to them at the PAT appointment. Patient was also instructed that they will need to review over the PAT instructions again at home before surgery.

## 2017-07-29 ENCOUNTER — Ambulatory Visit (HOSPITAL_COMMUNITY): Payer: Medicaid Other | Admitting: Anesthesiology

## 2017-07-29 ENCOUNTER — Ambulatory Visit (HOSPITAL_COMMUNITY): Payer: Medicaid Other

## 2017-07-29 ENCOUNTER — Encounter (HOSPITAL_COMMUNITY): Payer: Self-pay

## 2017-07-29 ENCOUNTER — Encounter (HOSPITAL_COMMUNITY)
Admission: RE | Disposition: A | Discharge status: Home / Self Care | Payer: Self-pay | Source: Ambulatory Visit | Attending: Emergency Medicine

## 2017-07-29 ENCOUNTER — Ambulatory Visit (HOSPITAL_COMMUNITY)
Admission: RE | Admit: 2017-07-29 | Discharge: 2017-07-29 | Disposition: A | Discharge status: Home / Self Care | Payer: Medicaid Other | Source: Ambulatory Visit | Attending: Emergency Medicine | Admitting: Emergency Medicine

## 2017-07-29 DIAGNOSIS — E039 Hypothyroidism, unspecified: Secondary | ICD-10-CM | POA: Diagnosis not present

## 2017-07-29 DIAGNOSIS — J449 Chronic obstructive pulmonary disease, unspecified: Secondary | ICD-10-CM | POA: Insufficient documentation

## 2017-07-29 DIAGNOSIS — Z79899 Other long term (current) drug therapy: Secondary | ICD-10-CM | POA: Insufficient documentation

## 2017-07-29 DIAGNOSIS — Z8673 Personal history of transient ischemic attack (TIA), and cerebral infarction without residual deficits: Secondary | ICD-10-CM | POA: Diagnosis not present

## 2017-07-29 DIAGNOSIS — Z7982 Long term (current) use of aspirin: Secondary | ICD-10-CM | POA: Insufficient documentation

## 2017-07-29 DIAGNOSIS — R911 Solitary pulmonary nodule: Secondary | ICD-10-CM | POA: Diagnosis not present

## 2017-07-29 DIAGNOSIS — Z419 Encounter for procedure for purposes other than remedying health state, unspecified: Secondary | ICD-10-CM

## 2017-07-29 DIAGNOSIS — I10 Essential (primary) hypertension: Secondary | ICD-10-CM | POA: Insufficient documentation

## 2017-07-29 DIAGNOSIS — Z87891 Personal history of nicotine dependence: Secondary | ICD-10-CM | POA: Diagnosis not present

## 2017-07-29 DIAGNOSIS — J984 Other disorders of lung: Secondary | ICD-10-CM | POA: Insufficient documentation

## 2017-07-29 DIAGNOSIS — Z7951 Long term (current) use of inhaled steroids: Secondary | ICD-10-CM | POA: Insufficient documentation

## 2017-07-29 DIAGNOSIS — Z9889 Other specified postprocedural states: Secondary | ICD-10-CM

## 2017-07-29 HISTORY — PX: VIDEO BRONCHOSCOPY WITH ENDOBRONCHIAL NAVIGATION: SHX6175

## 2017-07-29 SURGERY — VIDEO BRONCHOSCOPY WITH ENDOBRONCHIAL NAVIGATION
Anesthesia: General | Site: Chest | Laterality: Left

## 2017-07-29 MED ORDER — ONDANSETRON HCL 4 MG/2ML IJ SOLN
INTRAMUSCULAR | Status: AC
  Filled 2017-07-29: qty 2

## 2017-07-29 MED ORDER — 0.9 % SODIUM CHLORIDE (POUR BTL) OPTIME
TOPICAL | Status: DC | PRN
  Administered 2017-07-29: 1000 mL

## 2017-07-29 MED ORDER — DEXAMETHASONE SODIUM PHOSPHATE 10 MG/ML IJ SOLN
INTRAMUSCULAR | Status: DC | PRN
  Administered 2017-07-29: 10 mg via INTRAVENOUS

## 2017-07-29 MED ORDER — FENTANYL CITRATE (PF) 250 MCG/5ML IJ SOLN
INTRAMUSCULAR | Status: AC
  Filled 2017-07-29: qty 5

## 2017-07-29 MED ORDER — SUCCINYLCHOLINE CHLORIDE 200 MG/10ML IV SOSY
PREFILLED_SYRINGE | INTRAVENOUS | Status: AC
  Filled 2017-07-29: qty 10

## 2017-07-29 MED ORDER — LACTATED RINGERS IV SOLN
INTRAVENOUS | Status: DC | PRN
  Administered 2017-07-29 (×2): via INTRAVENOUS

## 2017-07-29 MED ORDER — EPHEDRINE 5 MG/ML INJ
INTRAVENOUS | Status: AC
  Filled 2017-07-29: qty 10

## 2017-07-29 MED ORDER — PROPOFOL 10 MG/ML IV BOLUS
INTRAVENOUS | Status: DC | PRN
  Administered 2017-07-29: 120 mg via INTRAVENOUS

## 2017-07-29 MED ORDER — FENTANYL CITRATE (PF) 100 MCG/2ML IJ SOLN: 25.0000 ug | INTRAMUSCULAR | Status: DC | PRN

## 2017-07-29 MED ORDER — DEXAMETHASONE SODIUM PHOSPHATE 10 MG/ML IJ SOLN
INTRAMUSCULAR | Status: AC
  Filled 2017-07-29: qty 1

## 2017-07-29 MED ORDER — PHENYLEPHRINE 40 MCG/ML (10ML) SYRINGE FOR IV PUSH (FOR BLOOD PRESSURE SUPPORT)
PREFILLED_SYRINGE | INTRAVENOUS | Status: AC
  Filled 2017-07-29: qty 10

## 2017-07-29 MED ORDER — MIDAZOLAM HCL 5 MG/5ML IJ SOLN
INTRAMUSCULAR | Status: DC | PRN
  Administered 2017-07-29: 2 mg via INTRAVENOUS

## 2017-07-29 MED ORDER — ONDANSETRON HCL 4 MG/2ML IJ SOLN
INTRAMUSCULAR | Status: DC | PRN
  Administered 2017-07-29: 4 mg via INTRAVENOUS

## 2017-07-29 MED ORDER — SUGAMMADEX SODIUM 200 MG/2ML IV SOLN
INTRAVENOUS | Status: AC
  Filled 2017-07-29: qty 2

## 2017-07-29 MED ORDER — FENTANYL CITRATE (PF) 100 MCG/2ML IJ SOLN
INTRAMUSCULAR | Status: DC | PRN
  Administered 2017-07-29 (×2): 50 ug via INTRAVENOUS

## 2017-07-29 MED ORDER — SUGAMMADEX SODIUM 200 MG/2ML IV SOLN
INTRAVENOUS | Status: DC | PRN
  Administered 2017-07-29: 100 mg via INTRAVENOUS

## 2017-07-29 MED ORDER — LIDOCAINE HCL (CARDIAC) 20 MG/ML IV SOLN
INTRAVENOUS | Status: DC | PRN
  Administered 2017-07-29: 40 mg via INTRATRACHEAL

## 2017-07-29 MED ORDER — EPHEDRINE SULFATE 50 MG/ML IJ SOLN
INTRAMUSCULAR | Status: DC | PRN
  Administered 2017-07-29 (×5): 5 mg via INTRAVENOUS

## 2017-07-29 MED ORDER — PROPOFOL 10 MG/ML IV BOLUS
INTRAVENOUS | Status: AC
  Filled 2017-07-29: qty 20

## 2017-07-29 MED ORDER — PHENYLEPHRINE HCL 10 MG/ML IJ SOLN
INTRAMUSCULAR | Status: DC | PRN
  Administered 2017-07-29 (×4): 80 ug via INTRAVENOUS

## 2017-07-29 MED ORDER — LIDOCAINE 2% (20 MG/ML) 5 ML SYRINGE
INTRAMUSCULAR | Status: AC
  Filled 2017-07-29: qty 5

## 2017-07-29 MED ORDER — PROMETHAZINE HCL 25 MG/ML IJ SOLN: 6.2500 mg | INTRAMUSCULAR | Status: DC | PRN

## 2017-07-29 MED ORDER — MIDAZOLAM HCL 2 MG/2ML IJ SOLN
INTRAMUSCULAR | Status: AC
  Filled 2017-07-29: qty 2

## 2017-07-29 MED ORDER — ROCURONIUM BROMIDE 100 MG/10ML IV SOLN
INTRAVENOUS | Status: DC | PRN
Start: 2017-07-29 — End: 2017-07-29
  Administered 2017-07-29: 10 mg via INTRAVENOUS
  Administered 2017-07-29: 30 mg via INTRAVENOUS
  Administered 2017-07-29: 20 mg via INTRAVENOUS

## 2017-07-29 MED ORDER — ROCURONIUM BROMIDE 10 MG/ML (PF) SYRINGE
PREFILLED_SYRINGE | INTRAVENOUS | Status: AC
  Filled 2017-07-29: qty 5

## 2017-07-29 SURGICAL SUPPLY — 41 items
ADAPTER BRONCH F/PENTAX (ADAPTER) ×2 IMPLANT
ADPR BSCP EDG PNTX (ADAPTER) ×1
BRUSH CYTOL CELLEBRITY 1.5X140 (MISCELLANEOUS) ×1 IMPLANT
BRUSH SUPERTRAX BIOPSY (INSTRUMENTS) IMPLANT
BRUSH SUPERTRAX NDL-TIP CYTO (INSTRUMENTS) ×2 IMPLANT
CANISTER SUCT 3000ML PPV (MISCELLANEOUS) ×2 IMPLANT
CHANNEL WORK EXTEND EDGE 180 (KITS) IMPLANT
CHANNEL WORK EXTEND EDGE 45 (KITS) IMPLANT
CHANNEL WORK EXTEND EDGE 90 (KITS) IMPLANT
CONT SPEC 4OZ CLIKSEAL STRL BL (MISCELLANEOUS) ×3 IMPLANT
COVER BACK TABLE 60X90IN (DRAPES) ×2 IMPLANT
FILTER STRAW FLUID ASPIR (MISCELLANEOUS) IMPLANT
FORCEPS BIOP SUPERTRX PREMAR (INSTRUMENTS) ×2 IMPLANT
GAUZE SPONGE 4X4 12PLY STRL (GAUZE/BANDAGES/DRESSINGS) ×2 IMPLANT
GLOVE BIO SURGEON STRL SZ 6 (GLOVE) ×1 IMPLANT
GLOVE BIO SURGEON STRL SZ7.5 (GLOVE) ×4 IMPLANT
GOWN STRL REUS W/ TWL LRG LVL3 (GOWN DISPOSABLE) ×2 IMPLANT
GOWN STRL REUS W/TWL LRG LVL3 (GOWN DISPOSABLE) ×4
KIT CLEAN ENDO COMPLIANCE (KITS) ×2 IMPLANT
KIT LOCATABLE GUIDE (CANNULA) IMPLANT
KIT MARKER FIDUCIAL DELIVERY (KITS) IMPLANT
KIT PROCEDURE EDGE 180 (KITS) ×1 IMPLANT
KIT PROCEDURE EDGE 45 (KITS) IMPLANT
KIT PROCEDURE EDGE 90 (KITS) IMPLANT
KIT ROOM TURNOVER OR (KITS) ×2 IMPLANT
MARKER SKIN DUAL TIP RULER LAB (MISCELLANEOUS) ×2 IMPLANT
NDL SUPERTRX PREMARK BIOPSY (NEEDLE) ×1 IMPLANT
NEEDLE SUPERTRX PREMARK BIOPSY (NEEDLE) ×2 IMPLANT
NS IRRIG 1000ML POUR BTL (IV SOLUTION) ×2 IMPLANT
OIL SILICONE PENTAX (PARTS (SERVICE/REPAIRS)) ×2 IMPLANT
PAD ARMBOARD 7.5X6 YLW CONV (MISCELLANEOUS) ×4 IMPLANT
PATCHES PATIENT (LABEL) ×6 IMPLANT
SYR 20CC LL (SYRINGE) ×2 IMPLANT
SYR 20ML ECCENTRIC (SYRINGE) ×2 IMPLANT
SYR 50ML LL SCALE MARK (SYRINGE) ×1 IMPLANT
SYR 50ML SLIP (SYRINGE) ×2 IMPLANT
TOWEL OR 17X24 6PK STRL BLUE (TOWEL DISPOSABLE) ×2 IMPLANT
TRAP SPECIMEN MUCOUS 40CC (MISCELLANEOUS) ×1 IMPLANT
TUBE CONNECTING 20X1/4 (TUBING) ×2 IMPLANT
UNDERPAD 30X30 (UNDERPADS AND DIAPERS) ×2 IMPLANT
WATER STERILE IRR 1000ML POUR (IV SOLUTION) ×2 IMPLANT

## 2017-07-29 NOTE — Anesthesia Procedure Notes (Signed)
Procedure Name: Intubation Date/Time: 07/29/2017 8:25 AM Performed by: Shirlyn Goltz, CRNA Pre-anesthesia Checklist: Patient identified, Emergency Drugs available, Suction available and Patient being monitored Patient Re-evaluated:Patient Re-evaluated prior to induction Oxygen Delivery Method: Circle system utilized Preoxygenation: Pre-oxygenation with 100% oxygen Induction Type: IV induction Ventilation: Mask ventilation without difficulty Laryngoscope Size: Mac and 3 Grade View: Grade I Tube type: Oral Tube size: 8.5 mm Number of attempts: 1 Airway Equipment and Method: Stylet and LTA kit utilized Placement Confirmation: ETT inserted through vocal cords under direct vision,  positive ETCO2 and breath sounds checked- equal and bilateral Secured at: 21 cm Tube secured with: Tape Dental Injury: Teeth and Oropharynx as per pre-operative assessment

## 2017-07-29 NOTE — Discharge Instructions (Signed)
Flexible Bronchoscopy, Care After These instructions give you information on caring for yourself after your procedure. Your doctor may also give you more specific instructions. Call your doctor if you have any problems or questions after your procedure. Follow these instructions at home:  Do not eat or drink anything for 2 hours after your procedure. If you try to eat or drink before the medicine wears off, food or drink could go into your lungs. You could also burn yourself.  After 2 hours have passed and when you can cough and gag normally, you may eat soft food and drink liquids slowly.  The day after the test, you may eat your normal diet.  You may do your normal activities.  Keep all doctor visits. Get help right away if:  You get more and more short of breath.  You get light-headed.  You feel like you are going to pass out (faint).  You have chest pain.  You have new problems that worry you.  You cough up more than a little blood.  You cough up more blood than before.\    Please call our office for any questions or concerns. [phone].   This information is not intended to replace advice given to you by your health care provider. Make sure you discuss any questions you have with your health care provider. Document Released: 06/15/2009 Document Revised: 01/24/2016 Document Reviewed: 04/22/2013 Elsevier Interactive Patient Education  2017 [employer].

## 2017-07-29 NOTE — Op Note (Signed)
Video Bronchoscopy with Electromagnetic Navigation Procedure Note  Date of Operation: 07/29/2017  Pre-op Diagnosis: Left upper lobe nodule  Post-op Diagnosis: Left upper lobe nodule  Surgeon: Baltazar Apo  Assistants: None  Anesthesia: General endotracheal anesthesia  Operation: Flexible video fiberoptic bronchoscopy with electromagnetic navigation and biopsies.  Estimated Blood Loss: Minimal  Complications: None apparent  Indications and History: Cheryl Haley is a 61 y.o. female with history of tobacco use, COPD.  She was found to have a left upper lobe pulmonary nodule on chest x-ray and subsequently a CT scan after she was treated for a community-acquired pneumonia.  This was hypermetabolic on PET scan.  Recommendation was made to achieve tissue diagnosis via navigational bronchoscopy.  The risks, benefits, complications, treatment options and expected outcomes were discussed with the patient.  The possibilities of pneumothorax, pneumonia, reaction to medication, pulmonary aspiration, perforation of a viscus, bleeding, failure to diagnose a condition and creating a complication requiring transfusion or operation were discussed with the patient who freely signed the consent.    Description of Procedure: The patient was seen in the Preoperative Area, was examined and was deemed appropriate to proceed.  The patient was taken to OR 10, identified as Cheryl Haley and the procedure verified as Flexible Video Fiberoptic Bronchoscopy.  A Time Out was held and the above information confirmed.   Prior to the date of the procedure a high-resolution CT scan of the chest was performed. Utilizing Bruno a virtual tracheobronchial tree was generated to allow the creation of distinct navigation pathways to the patient's parenchymal abnormalities. After being taken to the operating room general anesthesia was initiated and the patient  was orally intubated. The video fiberoptic  bronchoscope was introduced via the endotracheal tube and a general inspection was performed which showed normal airways throughout.  There were no endobronchial lesions or abnormal secretions seen. The extendable working channel and locator guide were introduced into the bronchoscope. The distinct navigation pathways prepared prior to this procedure were then utilized to navigate to within 0.3 cm of patient's lesion identified on CT scan. The extendable working channel was secured into place and the locator guide was withdrawn. Under fluoroscopic guidance transbronchial needle brushings, transbronchial Wang needle biopsies, and transbronchial forceps biopsies were performed to be sent for cytology and pathology. A bronchioalveolar lavage was performed in the left upper lobe and sent for cytology and microbiology (bacterial, fungal, AFB smears and cultures). At the end of the procedure a general airway inspection was performed and there was no evidence of active bleeding. The bronchoscope was removed.  The patient tolerated the procedure well. There was no significant blood loss and there were no obvious complications. A post-procedural chest x-ray is pending.  Samples: 1. Transbronchial needle brushings from left upper lobe nodule 2. Transbronchial Wang needle biopsies from left upper lobe nodule 3. Transbronchial forceps biopsies from left upper lobe nodule 4. Bronchoalveolar lavage from left upper lobe  Plans:  The patient will be discharged from the PACU to home when recovered from anesthesia and after chest x-ray is reviewed. We will review the cytology, pathology and microbiology results with the patient when they become available. Outpatient followup will be with Dr [name] or Dr Lamonte Sakai.    Baltazar Apo, MD, PhD 07/29/2017, 9:59 AM [facility] [phone] or if no answer [phone]

## 2017-07-29 NOTE — Anesthesia Postprocedure Evaluation (Signed)
Anesthesia Post Note  Patient: Cheryl Haley  Procedure(s) Performed: VIDEO BRONCHOSCOPY WITH ENDOBRONCHIAL NAVIGATION (Left Chest)     Patient location during evaluation: PACU Anesthesia Type: General Level of consciousness: awake and alert Pain management: pain level controlled Vital Signs Assessment: post-procedure vital signs reviewed and stable Respiratory status: spontaneous breathing, nonlabored ventilation, respiratory function stable and patient connected to nasal cannula oxygen Cardiovascular status: blood pressure returned to baseline and stable Postop Assessment: no apparent nausea or vomiting Anesthetic complications: no    Last Vitals:  Vitals:   07/29/17 1047 07/29/17 1051  BP: (!) 118/56 (!) 124/101  Pulse: 67 61  Resp: 17 16  Temp: 36.7 C   SpO2: 93% 94%    Last Pain:  Vitals:   07/29/17 1051  TempSrc:   PainSc: 0-No pain                 Tiajuana Amass

## 2017-07-29 NOTE — Transfer of Care (Signed)
Immediate Anesthesia Transfer of Care Note  Patient: Cheryl Haley  Procedure(s) Performed: VIDEO BRONCHOSCOPY WITH ENDOBRONCHIAL NAVIGATION (Left Chest)  Patient Location: PACU  Anesthesia Type:General  Level of Consciousness: awake, alert , oriented and patient cooperative  Airway & Oxygen Therapy: Patient Spontanous Breathing and Patient connected to face mask oxygen  Post-op Assessment: Report given to RN and Post -op Vital signs reviewed and stable  Post vital signs: Reviewed and stable  Last Vitals:  Vitals:   07/29/17 0615 07/29/17 1009  BP: (!) 167/64 (!) 111/53  Pulse: 62 64  Resp: 18 19  Temp: 36.7 C (!) 36.3 C  SpO2: 97% 100%    Last Pain:  Vitals:   07/29/17 1009  TempSrc:   PainSc: (P) 0-No pain      Patients Stated Pain Goal: 0 (03/75/43 6067)  Complications: No apparent anesthesia complications

## 2017-07-29 NOTE — H&P (Signed)
Cheryl Haley is an 61 y.o. female.   Chief Complaint: LUL nodule HPI: 61 yo woman, hx tobacco and COPD, noted to have a LUL nodule, positive on PET 07/14/17. She presents today for navigational FOB and biopsies. No new complaints - no dyspnea, no CP. She uses atrovent prn, symbicort on a schedule.   Past Medical History:  Diagnosis Date  . Anxiety   . 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 COPD (chronic obstructive pulmonary disease) ([place])   . History of hiatal hernia   . Hx of TIA (transient ischemic attack) and stroke   . Hypertension   . Hypothyroidism   . Pneumonia   . Thyroid disease     Past Surgical History:  Procedure Laterality Date  . ESOPHAGOGASTRODUODENOSCOPY      Family History  Problem Relation Age of Onset  . Emphysema Father   . Heart disease Father   . Emphysema Brother   . Emphysema Sister   . Emphysema Sister   . Heart disease Mother   . Heart disease Sister   . Heart disease Brother   . Heart disease Brother   . Congestive Heart Failure Sister   . Congestive Heart Failure Sister   . Cancer Sister        lung  . Cancer Sister        breast   Social History:  reports that she quit smoking about 4 months ago. Her smoking use included cigarettes. She has a 80.00 pack-year smoking history. she has never used smokeless tobacco. She reports that she drinks about 8.4 oz of alcohol per week. She reports that she does not use drugs.  Allergies: No Known Allergies  Medications Prior to Admission  Medication Sig Dispense Refill  . aspirin EC 81 MG tablet Take 81 mg daily by mouth.     . ATROVENT HFA 17 MCG/ACT inhaler INHALE 2 PUFFS BY MOUTH EVERY 6 HOURS AS NEEDED FOR WHEEZING 1 Inhaler 5  . budesonide-formoterol (SYMBICORT) 160-4.5 MCG/ACT inhaler Inhale 2 puffs 2 (two) times daily into the lungs. 3 Inhaler 1  . diltiazem (CARDIZEM) 90 MG tablet Take 135 mg 2 (two) times daily by mouth.    . levothyroxine (SYNTHROID, LEVOTHROID) 100 MCG tablet Take 100 mcg daily  before breakfast by mouth.    . metoprolol succinate (TOPROL-XL) 25 MG 24 hr tablet Take 25 mg daily by mouth.    . NON FORMULARY Pt wears 02 2lpm continuous.  Is to wear with exertion and at night.    . Spacer/Aero-Holding Chambers (AEROCHAMBER MV) inhaler Use as instructed 1 each 0    Results for orders placed or performed during the hospital encounter of 07/28/17 (from the past 48 hour(s))  CBC     Status: Abnormal   Collection Time: 07/28/17  8:27 AM  Result Value Ref Range   WBC 6.3 4.0 - 10.5 K/uL   RBC 4.90 3.87 - 5.11 MIL/uL   Hemoglobin 14.8 12.0 - 15.0 g/dL   HCT 44.1 36.0 - 46.0 %   MCV 90.0 78.0 - 100.0 fL   MCH 30.2 26.0 - 34.0 pg   MCHC 33.6 30.0 - 36.0 g/dL   RDW 15.7 (H) 11.5 - 15.5 %   Platelets 400 150 - 400 K/uL  Basic metabolic panel     Status: Abnormal   Collection Time: 07/28/17  8:27 AM  Result Value Ref Range   Sodium 135 135 - 145 mmol/L   Potassium 3.8 3.5 - 5.1 mmol/L  Chloride 99 (L) 101 - 111 mmol/L   CO2 27 22 - 32 mmol/L   Glucose, Bld 108 (H) 65 - 99 mg/dL   BUN 9 6 - 20 mg/dL   Creatinine, Ser 0.68 0.44 - 1.00 mg/dL   Calcium 9.2 8.9 - 10.3 mg/dL   GFR calc non Af Amer >60 >60 mL/min   GFR calc Af Amer >60 >60 mL/min    Comment: (NOTE) The eGFR has been calculated using the CKD EPI equation. This calculation has not been validated in all clinical situations. eGFR's persistently <60 mL/min signify possible Chronic Kidney Disease.    Anion gap 9 5 - 15   No results found.  ROS  Blood pressure (!) 167/64, pulse 62, temperature 98.1 F (36.7 C), temperature source Oral, resp. rate 18, height _0  (1.626 m), weight 47.6 kg (105 lb), SpO2 97 %. Physical Exam  Gen: Pleasant, thin woman, in no distress,  normal affect  ENT: No lesions,  mouth clear,  oropharynx clear, M2-3 airway  Neck: No JVD, no stridor  Lungs: No use of accessory muscles, distant but clear  Cardiovascular: RRR, heart sounds normal, no murmur or gallops, no  peripheral edema  Musculoskeletal: No deformities, no cyanosis or clubbing  Neuro: alert, non focal  Skin: Warm, no lesions or rashes   Assessment/Plan 61 yo woman, hx COPD, recently stopped smoking. Noted to have a LUL nodule after treated for a CAP, hypermetabolic on PET. She presents today for navigational FOB. No contraindications. Procedure explained to the patient, all questions answered. Plan to proceed.    Baltazar Apo, MD, PhD 07/29/2017, 8:04 AM [facility] [phone] or if no answer [phone]

## 2017-07-30 ENCOUNTER — Encounter (HOSPITAL_COMMUNITY): Payer: Self-pay | Admitting: Emergency Medicine

## 2017-07-31 ENCOUNTER — Telehealth: Payer: Self-pay | Admitting: Pulmonary Disease

## 2017-07-31 LAB — CULTURE, RESPIRATORY: CULTURE: NO GROWTH

## 2017-07-31 LAB — CULTURE, RESPIRATORY W GRAM STAIN

## 2017-07-31 NOTE — Telephone Encounter (Signed)
LMTCB BQ- please advise if you have received and results back yet on her bx, thanks

## 2017-08-03 ENCOUNTER — Telehealth: Payer: Self-pay | Admitting: Pulmonary Disease

## 2017-08-03 DIAGNOSIS — R911 Solitary pulmonary nodule: Secondary | ICD-10-CM

## 2017-08-03 NOTE — Telephone Encounter (Signed)
I called Cheryl Haley to let her know that the biopsy showed no evidence of malignancy but there was some granulomatous inflammation.  Right now the best approach for [place] is to keep a close eye on this by ordering another CT scan in 3 months.  Triage: Please order a noncontrast CT chest for February 2018 and arrange for an appointment with me.

## 2017-08-03 NOTE — Telephone Encounter (Signed)
CT has been ordered. lmtcb x1 for pt to schedule apt.

## 2017-08-03 NOTE — Telephone Encounter (Signed)
I called her to try to talk to her about the results but I had to leave a message.  Please try to call her back and figure out when she would be available so that we can discuss more.

## 2017-08-03 NOTE — Addendum Note (Signed)
Addended by: Maryanna Shape A on: 08/03/2017 05:26 PM   Modules accepted: Orders

## 2017-08-03 NOTE — Telephone Encounter (Signed)
Called and spoke with pt. Pt states she will be available the rest of the day. Will route to BQ to make ware.

## 2017-08-03 NOTE — Telephone Encounter (Signed)
Spoke with patient, informed her message is being routed to BQ and we will call her back once results/recommendations are received  Pt voiced her understanding  [name] please advise on biopsy results, thank you.

## 2017-08-07 ENCOUNTER — Inpatient Hospital Stay: Payer: Medicaid Other | Admitting: Pulmonary Disease

## 2017-10-06 ENCOUNTER — Other Ambulatory Visit: Payer: Medicaid Other

## 2017-11-17 ENCOUNTER — Ambulatory Visit (INDEPENDENT_AMBULATORY_CARE_PROVIDER_SITE_OTHER): Payer: Medicaid Other | Admitting: Pulmonary Disease

## 2017-11-17 ENCOUNTER — Encounter: Payer: Self-pay | Admitting: Pulmonary Disease

## 2017-11-17 VITALS — BP 142/68 | HR 77 | Ht 64.0 in | Wt 112.4 lb

## 2017-11-17 DIAGNOSIS — Z720 Tobacco use: Secondary | ICD-10-CM

## 2017-11-17 DIAGNOSIS — R911 Solitary pulmonary nodule: Secondary | ICD-10-CM | POA: Diagnosis not present

## 2017-11-17 DIAGNOSIS — J432 Centrilobular emphysema: Secondary | ICD-10-CM | POA: Diagnosis not present

## 2017-11-17 NOTE — Patient Instructions (Signed)
Severe COPD: Continue Symbicort 2 puffs twice a day Continue Atrovent Continue to practice good hand hygiene this time a year  Pulmonary nodule: I am very happy that the November 2018 studies did not show evidence of malignancy We will repeat a CT chest in May 2019 to follow the nodule  We will see you back in 6 months or sooner if needed

## 2017-11-17 NOTE — Progress Notes (Signed)
Subjective:    Patient ID: Cheryl Haley, female    DOB: 09/19/1955, 62 y.o.   MRN: [id number]  Synopsis: Former patient of Dr. Gwenette Greet first seen by [facility] in August 2016, she has COPD. Hospitalized for COPD in 09/2013 Cigarette smoker, cut back in 2016, quit in July 2018 after smoking 40 pack years  No change with spiriva or incruse.   In November 2018 she had a pulmonary nodule which was worrisome for malignancy.  Navigational biopsies were performed which showed granulomatous inflammation.  HPI Chief Complaint  Patient presents with  . Follow-up    c/o stable DOE.    Cheryl Haley has been trying to stay healthy.  She has been staying away from sick people and washing her hands frequently.  She says that she has been having more allergy symptoms like a scratchy throat.    No weight loss. She has gained 10 pounds since September.    She is has not resumed smoking cigarettes.  She continues to take Symbicort 2 puffs twice a day.  She uses Atrovent sparingly.  Past Medical History:  Diagnosis Date  . Anxiety   . 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 COPD (chronic obstructive pulmonary disease) ([place])   . History of hiatal hernia   . Hx of TIA (transient ischemic attack) and stroke   . Hypertension   . Hypothyroidism   . Pneumonia   . Thyroid disease       Review of Systems  Constitutional: Negative for chills, fatigue and fever.  HENT: Negative for postnasal drip, rhinorrhea and sinus pressure.   Respiratory: Negative for choking, shortness of breath and wheezing.   Cardiovascular: Negative for chest pain, palpitations and leg swelling.       Objective:   Physical Exam Vitals:   11/17/17 1042  BP: (!) 142/68  Pulse: 77  SpO2: 92%  Weight: 112 lb 6.4 oz (51 kg)  Height: _0  (1.626 m)    Gen: well appearing HENT: OP clear, TM's clear, neck supple PULM: Poor air movement B, normal percussion CV: RRR, no mgr, trace edema GI: BS+, soft, nontender Derm: no cyanosis or  rash Psyche: normal mood and affect  PFT: [name] 12/26/13:  FEV1 0.81 (33%), Ratio 41.  Chest imaging: 05/2014 CXR showed a small R pleural effusion, images independently reviewed August 2018 chest [facility] personally reviewed showing patchy infiltrates in the right upper lobe, question small right lower lobe effusion November 2018 CT chest several pulmonary nodules, the largest of which is greater than 1 cm in the left upper lobe November 2018 PET/CT 1 cm left upper lobe nodule is FDG avid 5.6  Pathology: July 29, 2017 surgical pathology left upper lobe biopsy chronic inflammation July 29, 2017 cytology from BAL brushings, needle biopsy: No malignant cells, some atypical cells seen on brushings which were favored to not represent malignancy   CBC    Component Value Date/Time   WBC 6.3 07/28/2017 0827   RBC 4.90 07/28/2017 0827   HGB 14.8 07/28/2017 0827   HCT 44.1 07/28/2017 0827   PLT 400 07/28/2017 0827   MCV 90.0 07/28/2017 0827   MCH 30.2 07/28/2017 0827   MCHC 33.6 07/28/2017 0827   RDW 15.7 (H) 07/28/2017 0827   LYMPHSABS 2.0 06/25/2017 0930   MONOABS 0.6 06/25/2017 0930   EOSABS 0.1 06/25/2017 0930   BASOSABS 0.0 06/25/2017 0930        Assessment & Plan:   Solitary pulmonary nodule  Centrilobular emphysema ([place])  Tobacco abuse  Discussion: We had a bit of a scare with Cheryl Haley back in November when she had the new 1 cm lung nodule.  Fortunately biopsies and brushings and all cytology studies did not show evidence of malignancy.  While she remains high risk for having lung cancer because of her extensive smoking history and emphysema, at this time we do not have definitive evidence of that which is good.  We will repeat a CT chest in May 2019 because that was the soonest that her insurance company would allow it.  We will need to continue to monitor this nodule because the sensitivity of doing biopsies in somebody like her with an  only 1 cm nodule is lower than an open lung biopsy so there remains a risk that all testing was a false negative.  However, she is doing well from a COPD standpoint.  Plan: Severe COPD: Continue Symbicort 2 puffs twice a day Continue Atrovent Continue to practice good hand hygiene this time a year  Pulmonary nodule: I am very happy that the November 2018 studies did not show evidence of malignancy We will repeat a CT chest in May 2019 to follow the nodule  We will see you back in 6 months or sooner if needed    Current Outpatient Medications:  .  aspirin EC 81 MG tablet, Take 81 mg daily by mouth. , Disp: , Rfl:  .  ATROVENT HFA 17 MCG/ACT inhaler, INHALE 2 PUFFS BY MOUTH EVERY 6 HOURS AS NEEDED FOR WHEEZING, Disp: 1 Inhaler, Rfl: 5 .  budesonide-formoterol (SYMBICORT) 160-4.5 MCG/ACT inhaler, Inhale 2 puffs 2 (two) times daily into the lungs., Disp: 3 Inhaler, Rfl: 1 .  diltiazem (CARDIZEM) 90 MG tablet, Take 135 mg 2 (two) times daily by mouth., Disp: , Rfl:  .  levothyroxine (SYNTHROID, LEVOTHROID) 100 MCG tablet, Take 100 mcg daily before breakfast by mouth., Disp: , Rfl:  .  metoprolol succinate (TOPROL-XL) 25 MG 24 hr tablet, Take 25 mg daily by mouth., Disp: , Rfl:  .  NON FORMULARY, Pt wears 02 2lpm continuous.  Is to wear with exertion and at night., Disp: , Rfl:  .  Spacer/Aero-Holding Chambers (AEROCHAMBER MV) inhaler, Use as instructed, Disp: 1 each, Rfl: 0

## 2017-12-31 ENCOUNTER — Inpatient Hospital Stay: Admission: RE | Admit: 2017-12-31 | Payer: Medicaid Other | Source: Ambulatory Visit

## 2018-01-05 ENCOUNTER — Ambulatory Visit (INDEPENDENT_AMBULATORY_CARE_PROVIDER_SITE_OTHER)
Admission: RE | Admit: 2018-01-05 | Discharge: 2018-01-05 | Disposition: A | Discharge status: Home / Self Care | Payer: Medicaid Other | Source: Ambulatory Visit | Attending: Pulmonary Disease | Admitting: Pulmonary Disease

## 2018-01-05 DIAGNOSIS — R911 Solitary pulmonary nodule: Secondary | ICD-10-CM | POA: Diagnosis not present

## 2018-01-08 ENCOUNTER — Other Ambulatory Visit: Payer: Self-pay | Admitting: Pulmonary Disease

## 2018-01-28 ENCOUNTER — Encounter: Payer: Self-pay | Admitting: Adult Health

## 2018-01-28 ENCOUNTER — Ambulatory Visit: Payer: Medicaid Other | Admitting: Adult Health

## 2018-01-28 ENCOUNTER — Telehealth: Payer: Self-pay | Admitting: Adult Health

## 2018-01-28 DIAGNOSIS — R0602 Shortness of breath: Secondary | ICD-10-CM

## 2018-01-28 DIAGNOSIS — R911 Solitary pulmonary nodule: Secondary | ICD-10-CM

## 2018-01-28 DIAGNOSIS — J439 Emphysema, unspecified: Secondary | ICD-10-CM

## 2018-01-28 MED ORDER — PREDNISONE 10 MG PO TABS: ORAL_TABLET | ORAL | 0 refills | Status: DC

## 2018-01-28 MED ORDER — BUDESONIDE-FORMOTEROL FUMARATE 160-4.5 MCG/ACT IN AERO: 2.0000 | INHALATION_SPRAY | Freq: Two times a day (BID) | RESPIRATORY_TRACT | 0 refills | Status: DC

## 2018-01-28 NOTE — Assessment & Plan Note (Signed)
Lung nodules - serial follow up has shown decrease in previous nodules. Interval development of new nodules in upper lobes . (RUL 1.8 cm ) will need CT chest w/o contrast in 3 months .

## 2018-01-28 NOTE — Progress Notes (Signed)
_0  ID: Cheryl Haley, female    DOB: 02/25/1956, 62 y.o.   MRN: [id number]  Chief Complaint  Patient presents with  . Acute Visit    COPD     Referring provider: [name], MD  HPI: 62 year old female former smoker followed for COPD and lung nodule-  TEST  PFT: [name] 12/26/13:  FEV1 0.81 (33%), Ratio 41.  Chest imaging: 05/2014 CXR showed a small R pleural effusion, images independently reviewed August 2018 [facility] reviewed showing patchy infiltrates in the right upper lobe, question small right lower lobe effusion November 2018 CT chest several pulmonary nodules, the largest of which is greater than 1 cm in the left upper lobe November 2018 PET/CT 1 cm left upper lobe nodule is FDG avid 5.6  Pathology: July 29, 2017 surgical pathology left upper lobe biopsy chronic inflammation July 29, 2017 cytology from BAL brushings, needle biopsy: No malignant cells, some atypical cells seen on brushings which were favored to not represent malignancy  01/28/2018 Follow up : COPD , Lung nodule  Patient returns for a six-month follow-up.  Patient says she is been doing well up until the last week where she has had increased shortness of breath, dry cough and wheezing. She denies any fever, discolored mucus or chest pain She remains on Symbicort twice daily and uses Atrovent as needed  Patient has a known lung nodule that has been followed on CT chest.  She had a PET scan November 2018 that showed hypermetabolic activity within the left upper lobe nodule.  She underwent bronchoscopy with biopsy.  Pathology returned showing chronic inflammation with only some atypical cells favor to not represent malignancy.  Patient has been recommended to have a CT chest follow-up and this was done Jan 05, 2018 which showed a interval decrease in the left upper lobe nodule and right lower lobe nodule likely showing an improving infectious or  inflammatory process.  There was an interval development of some bilateral irregular pulmonary nodules in the left upper lobe and right upper lobe. She denies any weight loss or hemoptysis.  No Known Allergies  Immunization History  Administered Date(s) Administered  . Influenza Split 05/31/2015  . Influenza,inj,Quad PF,6+ Mos 09/22/2013, 06/10/2016, 06/25/2017  . Pneumococcal Conjugate-13 04/17/2015  . Pneumococcal Polysaccharide-23 12/08/2016    Past Medical History:  Diagnosis Date  . Anxiety   . 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 COPD (chronic obstructive pulmonary disease) ([place])   . History of hiatal hernia   . Hx of TIA (transient ischemic attack) and stroke   . Hypertension   . Hypothyroidism   . Pneumonia   . Thyroid disease     Tobacco History: Social History   Tobacco Use  Smoking Status Former Smoker  . Packs/day: 2.00  . Years: 40.00  . Pack years: 80.00  . Types: Cigarettes  . Last attempt to quit: 03/16/2017  . Years since quitting: 0.8  Smokeless Tobacco Never Used  Tobacco Comment       Counseling given: Not Answered Comment:     Outpatient Encounter Medications as of 01/28/2018  Medication Sig  . aspirin EC 81 MG tablet Take 81 mg daily by mouth.   . ATROVENT HFA 17 MCG/ACT inhaler INHALE 2 PUFFS BY MOUTH EVERY 6 HOURS AS NEEDED FOR WHEEZING  . diltiazem (CARDIZEM) 90 MG tablet Take 135 mg 2 (two) times daily by mouth.  . levothyroxine (SYNTHROID, LEVOTHROID) 100 MCG tablet Take 100 mcg daily  before breakfast by mouth.  . metoprolol succinate (TOPROL-XL) 25 MG 24 hr tablet Take 25 mg daily by mouth.  . Spacer/Aero-Holding Chambers (AEROCHAMBER MV) inhaler Use as instructed  . SYMBICORT 160-4.5 MCG/ACT inhaler INHALE 2 PUFFS BY MOUTH TWICE DAILY INTO THE LUNGS  . budesonide-formoterol (SYMBICORT) 160-4.5 MCG/ACT inhaler Inhale 2 puffs into the lungs 2 (two) times daily.  . NON FORMULARY Pt wears 02 2lpm continuous.  Is to wear with exertion and at night.    . predniSONE (DELTASONE) 10 MG tablet 4 tabs for 2 days, then 3 tabs for 2 days, 2 tabs for 2 days, then 1 tab for 2 days, then stop   No facility-administered encounter medications on file as of 01/28/2018.      Review of Systems  Constitutional:   No  weight loss, night sweats,  Fevers, chills, + fatigue, or  lassitude.  HEENT:   No headaches,  Difficulty swallowing,  Tooth/dental problems, or  Sore throat,                No sneezing, itching, ear ache, nasal congestion, post nasal drip,   CV:  No chest pain,  Orthopnea, PND, swelling in lower extremities, anasarca, dizziness, palpitations, syncope.   GI  No heartburn, indigestion, abdominal pain, nausea, vomiting, diarrhea, change in bowel habits, loss of appetite, bloody stools.   Resp:  No chest wall deformity  Skin: no rash or lesions.  GU: no dysuria, change in color of urine, no urgency or frequency.  No flank pain, no hematuria   MS:  No joint pain or swelling.  No decreased range of motion.  No back pain.    Physical Exam  BP 128/80 (BP Location: Left Arm, Cuff Size: Normal)   Pulse 86   Temp (!) 97.3 F (36.3 C) (Oral)   Ht _0  (1.626 m)   Wt 112 lb 9.6 oz (51.1 kg)   SpO2 91%   BMI 19.33 kg/m   GEN: A/Ox3; pleasant , NAD, thin    HEENT:  [place]/AT,  EACs-clear, TMs-wnl, NOSE-clear, THROAT-clear, no lesions, no postnasal drip or exudate noted.   NECK:  Supple w/ fair ROM; no JVD; normal carotid impulses w/o bruits; no thyromegaly or nodules palpated; no lymphadenopathy.    RESP  Decreased BS in bases ,  no accessory muscle use, no dullness to percussion  CARD:  RRR, no m/r/g, no peripheral edema, pulses intact, no cyanosis or clubbing.  GI:   Soft & nt; nml bowel sounds; no organomegaly or masses detected.   Musco: Warm bil, no deformities or joint swelling noted.   Neuro: alert, no focal deficits noted.    Skin: Warm, no lesions or rashes    Lab Results:  CBC  BMET  Imaging: Ct Chest Wo  Contrast  Result Date: 01/05/2018 CLINICAL DATA:  Patient with pulmonary nodule.  Follow-up exam. EXAM: CT CHEST WITHOUT CONTRAST TECHNIQUE: Multidetector CT imaging of the chest was performed following the standard protocol without IV contrast. COMPARISON:  PET-CT 07/14/2017; chest CT 07/02/2017 FINDINGS: Cardiovascular: Normal heart size. Trace fluid superior pericardial recess. Coronary arterial vascular calcifications. Thoracic aortic vascular calcifications. Mediastinum/Nodes: No enlarged axillary, mediastinal or hilar lymphadenopathy. Normal esophagus. Lungs/Pleura: Central airways are patent. Subpleural scarring/atelectasis within the lower lobes bilaterally. Interval decrease in size of nodule within the central left upper lobe measuring 8 x 5 mm (image 62; series 3), previously 11 x 9 mm. Interval resolution of previously described peripheral left upper lobe nodule. Slight interval decrease in  size peripheral right lower lobe nodule measuring 8 mm (image 98; series 3), previously 10 mm. Interval development of a 6 x 11 mm nodule left upper lobe (image 31; series 3) and a 1.8 x 0.7 cm irregular nodule right upper lobe (image 66; series 3). Upper Abdomen: No acute process. Musculoskeletal: Thoracic spine degenerative changes. No aggressive or acute appearing osseous lesions. IMPRESSION: 1. Interval decrease in size of previously described left upper lobe nodule and right lower lobe nodule, likely improving infectious/inflammatory process. 2. Interval development of bilateral irregular pulmonary nodules as described above. Given the interval change from recent PET-CT, these are likely infectious/inflammatory in etiology. Recommend follow-up chest CT in 3 months to assess for interval improvement/resolution. 3. Aortic Atherosclerosis (ICD10-I70.0) and Emphysema (ICD10-J43.9). Electronically Signed   By: Lovey Newcomer M.D.   On: 01/05/2018 13:25     Assessment & Plan:   COPD (chronic obstructive pulmonary  disease) with emphysema (H[facility]) Mild flare Short steroid taper  Increase Atrovent Four times a day  .   Plan  Patient Instructions  Prednisone taper over next week.  Continue on Symbicort 2 puffs Twice daily   Take Atrovent 2 puffs Four times a day  .  Mucinex DM Twice daily  As needed  Cough/congestion  Follow up with [name] in 3 months and As needed   Please contact office for sooner follow up if symptoms do not improve or worsen or seek emergency care        Pulmonary nodule, left Lung nodules - serial follow up has shown decrease in previous nodules. Interval development of new nodules in upper lobes . (RUL 1.8 cm ) will need CT chest w/o contrast in 3 months .       Rexene Edison, NP 01/28/2018

## 2018-01-28 NOTE — Addendum Note (Signed)
Addended by: Georjean Mode on: 01/28/2018 04:28 PM   Modules accepted: Orders

## 2018-01-28 NOTE — Progress Notes (Signed)
Reviewed, agree 

## 2018-01-28 NOTE — Assessment & Plan Note (Addendum)
Mild flare Short steroid taper  Increase Atrovent Four times a day  .   Plan  Patient Instructions  Prednisone taper over next week.  Continue on Symbicort 2 puffs Twice daily   Take Atrovent 2 puffs Four times a day  .  Mucinex DM Twice daily  As needed  Cough/congestion  Follow up with [name] in 3 months and As needed   Please contact office for sooner follow up if symptoms do not improve or worsen or seek emergency care

## 2018-01-28 NOTE — Telephone Encounter (Signed)
Left voice mail on machine for patient to return phone call back regarding CT. X1 TP wanted to remind pt that she is needing a CT on her Chest w/o contrast in 41mo This is to check on her new small nodules in 67mo  Routing message to [place] to f/u

## 2018-01-28 NOTE — Patient Instructions (Addendum)
Prednisone taper over next week.  Continue on Symbicort 2 puffs Twice daily   Take Atrovent 2 puffs Four times a day  .  Mucinex DM Twice daily  As needed  Cough/congestion  Follow up with [name] in 3 months and As needed   Please contact office for sooner follow up if symptoms do not improve or worsen or seek emergency care   Late add CT chest in 3 months to follow lung nodule

## 2018-02-03 MED ORDER — IPRATROPIUM BROMIDE HFA 17 MCG/ACT IN AERS: 2.0000 | INHALATION_SPRAY | Freq: Four times a day (QID) | RESPIRATORY_TRACT | 6 refills | Status: DC

## 2018-02-03 NOTE — Telephone Encounter (Signed)
Returned Patient call.  Informed her that TP was wanting to remind her of CT Chest in 3 months.  Patient stated understanding. Patient also requested a refill of Atrovent inhaler be sent to preferred pharmacy because TP increased her dose to 2 puffs QID.  Refill sent to pharmacy.  Nothing further needed at this time.  Will route to [place]

## 2018-02-03 NOTE — Telephone Encounter (Signed)
Pt is returning call. Cb is [phone].

## 2018-02-03 NOTE — Telephone Encounter (Signed)
lmtcb x2 for pt. 

## 2018-02-07 ENCOUNTER — Other Ambulatory Visit: Payer: Self-pay | Admitting: Pulmonary Disease

## 2018-04-30 ENCOUNTER — Encounter: Payer: Self-pay | Admitting: Pulmonary Disease

## 2018-04-30 ENCOUNTER — Ambulatory Visit (INDEPENDENT_AMBULATORY_CARE_PROVIDER_SITE_OTHER): Payer: Medicaid Other | Admitting: Pulmonary Disease

## 2018-04-30 VITALS — BP 140/80 | HR 71 | Ht 63.23 in | Wt 110.0 lb

## 2018-04-30 DIAGNOSIS — R911 Solitary pulmonary nodule: Secondary | ICD-10-CM

## 2018-04-30 DIAGNOSIS — J439 Emphysema, unspecified: Secondary | ICD-10-CM | POA: Diagnosis not present

## 2018-04-30 DIAGNOSIS — Z23 Encounter for immunization: Secondary | ICD-10-CM

## 2018-04-30 MED ORDER — SPACER/AERO-HOLDING CHAMBERS DEVI: 1.0000 | Freq: Once | 0 refills | Status: DC

## 2018-04-30 MED ORDER — FLUTICASONE-UMECLIDIN-VILANT 100-62.5-25 MCG/INH IN AEPB: 1.0000 | INHALATION_SPRAY | Freq: Every day | RESPIRATORY_TRACT | 0 refills | Status: DC

## 2018-04-30 MED ORDER — SPACER/AERO-HOLDING CHAMBERS DEVI: 1.0000 | Freq: Once | 0 refills | Status: AC

## 2018-04-30 NOTE — Patient Instructions (Signed)
Severe COPD: Flu shot today Try taking Trelegy 1 puff daily no matter how you feel If there is no difference between Trelegy and Symbicort then go back to taking Symbicort Use Symbicort with a spacer when you take it Use the spacer for your inhalers like Atrovent or albuterol Practice good hand hygiene Stay active  Pulmonary nodule: We will arrange for the CT in November to be near your home in [place]  We will see you back in November 2019 or sooner if needed

## 2018-04-30 NOTE — Progress Notes (Signed)
Subjective:    Patient ID: Cheryl Haley, female    DOB: 04/28/1956, 62 y.o.   MRN: [id number]  Synopsis: Former patient of Dr. Gwenette Haley first seen by [facility] in August 2016, she has COPD. Hospitalized for COPD in 09/2013 Cigarette smoker, cut back in 2016, quit in July 2018 after smoking 40 pack years  No change with spiriva or incruse.   In November 2018 she had a pulmonary nodule which was worrisome for malignancy.  Navigational biopsies were performed which showed granulomatous inflammation.  HPI Chief Complaint  Patient presents with  . Follow-up    follow up, still short of breath   Cheryl Haley had a flare of her COPD in May and had prednisone which helped.  She calls it the "wonder drug". She says that she has exertional dyspnea which is the same.  She started a vitamin recently and feels more energy.  She says that her insurance is about to change.  She has not been sick since the last COPD exacerbation in May.  Past Medical History:  Diagnosis Date  . Anxiety   . 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 COPD (chronic obstructive pulmonary disease) ([place])   . History of hiatal hernia   . Hx of TIA (transient ischemic attack) and stroke   . Hypertension   . Hypothyroidism   . Pneumonia   . Thyroid disease       Review of Systems  Constitutional: Negative for chills, fatigue and fever.  HENT: Negative for postnasal drip, rhinorrhea and sinus pressure.   Respiratory: Positive for shortness of breath. Negative for choking and wheezing.   Cardiovascular: Negative for chest pain, palpitations and leg swelling.       Objective:   Physical Exam Vitals:   04/30/18 0939  BP: 140/80  Pulse: 71  SpO2: 92%  Weight: 110 lb (49.9 kg)  Height: 5' 3.23" (1.606 m)    Gen: well appearing HENT: OP clear, TM's clear, neck supple PULM: Poor air movement B, normal percussion CV: RRR, no mgr, trace edema GI: BS+, soft, nontender Derm: no cyanosis or rash Psyche: normal mood and  affect  PFT: Cheryl Haley 12/26/13:  FEV1 0.81 (33%), Ratio 41.  Chest imaging: 05/2014 CXR showed a small R pleural effusion, images independently reviewed August 2018 chest [facility] personally reviewed showing patchy infiltrates in the right upper lobe, question small right lower lobe effusion November 2018 CT chest several pulmonary nodules, the largest of which is greater than 1 cm in the left upper lobe November 2018 PET/CT 1 cm left upper lobe nodule is FDG avid 5.6  Pathology: July 29, 2017 surgical pathology left upper lobe biopsy chronic inflammation July 29, 2017 cytology from BAL brushings, needle biopsy: No malignant cells, some atypical cells seen on brushings which were favored to not represent malignancy   CBC    Component Value Date/Time   WBC 6.3 07/28/2017 0827   RBC 4.90 07/28/2017 0827   HGB 14.8 07/28/2017 0827   HCT 44.1 07/28/2017 0827   PLT 400 07/28/2017 0827   MCV 90.0 07/28/2017 0827   MCH 30.2 07/28/2017 0827   MCHC 33.6 07/28/2017 0827   RDW 15.7 (H) 07/28/2017 0827   LYMPHSABS 2.0 06/25/2017 0930   MONOABS 0.6 06/25/2017 0930   EOSABS 0.1 06/25/2017 0930   BASOSABS 0.0 06/25/2017 0930   Records from her visit in May 2019 with Tammy.  Reviewed where she was treated for a COPD exacerbation with prednisone.  A CT scan was arranged for August.     Assessment & Plan:   No diagnosis found.  Discussion: [name] returns to clinic today for further evaluation of her COPD and pulmonary nodules.  We had to change the follow-up CT for November from August per insurance requirements.  I think that is fine considering the fact that pathology from her last biopsy just showed granulomas.  She would like a flu shot.  Because of her severe airflow obstruction and ongoing dyspnea in the absence of tobacco use I would like to give her a sample of Trelegy.  Plan: Severe COPD: Flu shot today Try taking Trelegy 1 puff daily no  matter how you feel If there is no difference between Trelegy and Symbicort then go back to taking Symbicort Use Symbicort with a spacer when you take it Use the spacer for your inhalers like Atrovent or albuterol Practice good hand hygiene Stay active  Pulmonary nodule: We will arrange for the CT in November to be near your home in [place]  We will see you back in November 2019 or sooner if needed   Current Outpatient Medications:  .  aspirin EC 81 MG tablet, Take 81 mg daily by mouth. , Disp: , Rfl:  .  budesonide-formoterol (SYMBICORT) 160-4.5 MCG/ACT inhaler, Inhale 2 puffs into the lungs 2 (two) times daily., Disp: 1 Inhaler, Rfl: 0 .  diltiazem (CARDIZEM) 90 MG tablet, Take 135 mg 2 (two) times daily by mouth., Disp: , Rfl:  .  ipratropium (ATROVENT HFA) 17 MCG/ACT inhaler, Inhale 2 puffs into the lungs 4 (four) times daily., Disp: 3 Inhaler, Rfl: 6 .  levothyroxine (SYNTHROID, LEVOTHROID) 100 MCG tablet, Take 100 mcg daily before breakfast by mouth., Disp: , Rfl:  .  metoprolol succinate (TOPROL-XL) 25 MG 24 hr tablet, Take 25 mg daily by mouth., Disp: , Rfl:  .  Multiple Vitamin (MULTIVITAMIN) tablet, Take 1 tablet by mouth daily., Disp: , Rfl:  .  NON FORMULARY, Pt wears 02 2lpm continuous.  Is to wear with exertion and at night., Disp: , Rfl:  .  predniSONE (DELTASONE) 10 MG tablet, 4 tabs for 2 days, then 3 tabs for 2 days, 2 tabs for 2 days, then 1 tab for 2 days, then stop, Disp: 20 tablet, Rfl: 0 .  Spacer/Aero-Holding Chambers (AEROCHAMBER MV) inhaler, Use as instructed, Disp: 1 each, Rfl: 0 .  SYMBICORT 160-4.5 MCG/ACT inhaler, INHALE 2 PUFFS BY MOUTH TWICE DAILY INTO THE LUNGS, Disp: 3 Inhaler, Rfl: 1 .  Spacer/Aero-Holding Chambers DEVI, 1 Device by Does not apply route once for 1 dose., Disp: 1 each, Rfl: 0

## 2018-05-28 ENCOUNTER — Telehealth: Payer: Self-pay | Admitting: Pulmonary Disease

## 2018-05-28 MED ORDER — FLUTICASONE-UMECLIDIN-VILANT 100-62.5-25 MCG/INH IN AEPB: 1.0000 | INHALATION_SPRAY | Freq: Every day | RESPIRATORY_TRACT | 5 refills | Status: DC

## 2018-05-28 NOTE — Telephone Encounter (Signed)
Spoke with pt. She is needing a prescription for Trelegy. Rx has been sent in. Nothing further was needed. 

## 2018-05-31 ENCOUNTER — Telehealth: Payer: Self-pay | Admitting: Pulmonary Disease

## 2018-05-31 NOTE — Telephone Encounter (Signed)
Called and spoke with patient regarding her Trellegy Inhaler She advised that her pharmacy states it will be $600 for one inhaler Pt has Medicaid insurance, and they will not cover this medication at this time. Pt would like to know if BQ can suggest another inhaler  Offered pt assistance for Trellegy, she asked to mail the forms to her home Printed the forms, and mailed to pt today.  Routing message to [place] Nieves to f/u with patient.

## 2018-06-02 NOTE — Telephone Encounter (Signed)
Called patient, she has not received forms as of yet, when she does she will either mail back or drop of at office.

## 2018-06-04 NOTE — Telephone Encounter (Signed)
Called patient, unable to reach at this time, [facility]

## 2018-06-07 ENCOUNTER — Ambulatory Visit (INDEPENDENT_AMBULATORY_CARE_PROVIDER_SITE_OTHER): Payer: Medicaid Other | Admitting: Nurse Practitioner

## 2018-06-07 ENCOUNTER — Encounter: Payer: Self-pay | Admitting: Nurse Practitioner

## 2018-06-07 VITALS — BP 130/70 | HR 84 | Ht 64.0 in | Wt 108.8 lb

## 2018-06-07 DIAGNOSIS — J441 Chronic obstructive pulmonary disease with (acute) exacerbation: Secondary | ICD-10-CM

## 2018-06-07 MED ORDER — PREDNISONE 10 MG PO TABS: ORAL_TABLET | ORAL | 0 refills | Status: DC

## 2018-06-07 NOTE — Progress Notes (Signed)
_0  ID: Cheryl Haley, female    DOB: 10/12/1955, 62 y.o.   MRN: [id number]  Chief Complaint  Patient presents with  . Acute Visit    increased SOB at rest and exertion    Referring provider: [name], MD   HPI 62 year old female former smoker followed for COPD and lung nodule followed bt [name].  TESTS:  PFT: [name] 12/26/13: FEV1 0.81 (33%), Ratio 41.  Chest imaging: 05/2014 CXR showed a small R pleural effusion, images independently reviewed August 2018 [facility] personally reviewed showing patchy infiltrates in the right upper lobe, question small right lower lobe effusion November 2018 CT chest several pulmonary nodules, the largest of which is greater than 1 cm in the left upper lobe November 2018 PET/CT 1 cm left upper lobe nodule is FDG avid 5.6  Pathology: July 29, 2017 surgical pathology left upper lobe biopsy chronic inflammation July 29, 2017 cytology from BAL brushings, needle biopsy: No malignant cells, some atypical cells seen on brushings which were favored to not represent malignancy  OV 06/07/18 - acute COPD exacerbation Patient presents with shortness of breath. Symptoms started 1 week ago. States that symptoms have progressively worsened. She states that she has had a slight nonproductive cough. She reports wheezing. She denies fever, mucous, or chest pain. Denies any edema.    No Known Allergies  Immunization History  Administered Date(s) Administered  . Influenza Split 05/31/2015  . Influenza,inj,Quad PF,6+ Mos 09/22/2013, 06/10/2016, 06/25/2017, 04/30/2018  . Pneumococcal Conjugate-13 04/17/2015  . Pneumococcal Polysaccharide-23 12/08/2016    Past Medical History:  Diagnosis Date  . Anxiety   . 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 COPD (chronic obstructive pulmonary disease) ([place])   . History of hiatal hernia   . Hx of TIA (transient ischemic attack) and stroke   . Hypertension   .  Hypothyroidism   . Pneumonia   . Thyroid disease     Tobacco History: Social History   Tobacco Use  Smoking Status Former Smoker  . Packs/day: 2.00  . Years: 40.00  . Pack years: 80.00  . Types: Cigarettes  . Last attempt to quit: 03/16/2017  . Years since quitting: 1.2  Smokeless Tobacco Never Used  Tobacco Comment       Counseling given: Not Answered Comment:     Outpatient Encounter Medications as of 06/07/2018  Medication Sig  . aspirin EC 81 MG tablet Take 81 mg daily by mouth.   . budesonide-formoterol (SYMBICORT) 160-4.5 MCG/ACT inhaler Inhale 2 puffs into the lungs 2 (two) times daily.  Marland Kitchen diltiazem (CARDIZEM) 90 MG tablet Take 135 mg 2 (two) times daily by mouth.  . Fluticasone-Umeclidin-Vilant (TRELEGY ELLIPTA) 100-62.5-25 MCG/INH AEPB Inhale 1 puff into the lungs daily.  Marland Kitchen ipratropium (ATROVENT HFA) 17 MCG/ACT inhaler Inhale 2 puffs into the lungs 4 (four) times daily.  Marland Kitchen levothyroxine (SYNTHROID, LEVOTHROID) 100 MCG tablet Take 100 mcg daily before breakfast by mouth.  . metoprolol succinate (TOPROL-XL) 25 MG 24 hr tablet Take 25 mg daily by mouth.  . Multiple Vitamin (MULTIVITAMIN) tablet Take 1 tablet by mouth daily.  Marland Kitchen Spacer/Aero-Holding Chambers (AEROCHAMBER MV) inhaler Use as instructed  . SYMBICORT 160-4.5 MCG/ACT inhaler INHALE 2 PUFFS BY MOUTH TWICE DAILY INTO THE LUNGS  . NON FORMULARY Pt wears 02 2lpm continuous.  Is to wear with exertion and at night.  . predniSONE (DELTASONE) 10 MG tablet Take 4 tabs for 2 days, then 3 tabs for 2 days, then  2 tabs for 2 days, then 1 tab for 2 days, then stop  . [DISCONTINUED] predniSONE (DELTASONE) 10 MG tablet 4 tabs for 2 days, then 3 tabs for 2 days, 2 tabs for 2 days, then 1 tab for 2 days, then stop   No facility-administered encounter medications on file as of 06/07/2018.      Review of Systems  Review of Systems  Constitutional: Negative.  Negative for chills and fever.  HENT: Negative.  Negative for  congestion, sinus pressure and sinus pain.   Respiratory: Positive for cough, shortness of breath and wheezing.   Cardiovascular: Negative.  Negative for chest pain, palpitations and leg swelling.  Gastrointestinal: Negative.   Allergic/Immunologic: Negative.   Neurological: Negative.   Psychiatric/Behavioral: Negative.        Physical Exam  BP 130/70 (BP Location: Left Arm, Cuff Size: Normal)   Pulse 84   Ht _0  (1.626 m)   Wt 108 lb 12.8 oz (49.4 kg)   SpO2 92%   BMI 18.68 kg/m   Wt Readings from Last 5 Encounters:  06/07/18 108 lb 12.8 oz (49.4 kg)  04/30/18 110 lb (49.9 kg)  01/28/18 112 lb 9.6 oz (51.1 kg)  11/17/17 112 lb 6.4 oz (51 kg)  07/29/17 105 lb (47.6 kg)     Physical Exam  Constitutional: She is oriented to person, place, and time. She appears well-developed and well-nourished. No distress.  Cardiovascular: Normal rate and regular rhythm.  Pulmonary/Chest: Effort normal. No respiratory distress. She has wheezes.  Musculoskeletal: She exhibits no edema.  Neurological: She is alert and oriented to person, place, and time.  Psychiatric: She has a normal mood and affect.  Nursing note and vitals reviewed.      Assessment & Plan:   COPD with acute exacerbation (HCC) Prednisone taper over next week.  Continue on Symbicort 2 puffs Twice daily   Take Atrovent 2 puffs Four times a day  .  Mucinex DM Twice daily  As needed  Cough/congestion  Follow up with [name] in 3 months and As needed   Please contact office for sooner follow up if symptoms do not improve or worsen or seek emergency care      Fenton Foy, NP 06/07/2018

## 2018-06-07 NOTE — Assessment & Plan Note (Addendum)
Prednisone taper over next week.  Continue on Symbicort 2 puffs Twice daily   Take Atrovent 2 puffs Four times a day  .  Mucinex DM Twice daily  As needed  Cough/congestion  Follow up with [name] in 3 months and As needed   Please contact office for sooner follow up if symptoms do not improve or worsen or seek emergency care

## 2018-06-07 NOTE — Telephone Encounter (Signed)
   I have spoken with Rexene Edison, NP who last saw this patient 12/2017. She is in the [facility] office today, and has openings to see the patient available in [facility] today and Wednesday. If she would prefer she can be seen in the [place] office. Thanks so much.

## 2018-06-07 NOTE — Telephone Encounter (Signed)
Called and spoke to patient, patient states she is having an exacerbation, she is very SOB with minimal effort, no cough, no chest pain, would like prednisone taper called into pharmacy. States she goes to the bathroom and can't catch her breathe. Denies any other symptoms. Declined appointment. Patient voices she has filled out the patient assistance paper work and mailed back this morning.   SG please advise if willing to give patient pred taper  For exacerbation.

## 2018-06-07 NOTE — Patient Instructions (Signed)
Prednisone taper over next week.  Continue on Symbicort 2 puffs Twice daily   Take Atrovent 2 puffs Four times a day  .  Mucinex DM Twice daily  As needed  Cough/congestion  Follow up with [name] in 3 months and As needed   Please contact office for sooner follow up if symptoms do not improve or worsen or seek emergency care

## 2018-06-07 NOTE — Telephone Encounter (Signed)
Appointment made for 06/07/2018 at 2:00pm. Nothing further is needed at this time.

## 2018-06-10 ENCOUNTER — Telehealth: Payer: Self-pay | Admitting: Pulmonary Disease

## 2018-06-10 NOTE — Telephone Encounter (Signed)
Paperwork has been retrieved from up front and placed in BQ's look at. Will route to Burman Nieves for follow up.

## 2018-06-11 NOTE — Progress Notes (Signed)
Reviewed, agree 

## 2018-06-16 MED ORDER — FLUTICASONE-UMECLIDIN-VILANT 100-62.5-25 MCG/INH IN AEPB: 1.0000 | INHALATION_SPRAY | Freq: Every day | RESPIRATORY_TRACT | 5 refills | Status: DC

## 2018-06-18 MED ORDER — FLUTICASONE-UMECLIDIN-VILANT 100-62.5-25 MCG/INH IN AEPB: 1.0000 | INHALATION_SPRAY | Freq: Every day | RESPIRATORY_TRACT | 11 refills | Status: DC

## 2018-06-18 NOTE — Telephone Encounter (Signed)
[name] signed the Rx today and everything has been faxed over to [place] patient assistance program. Nothing further needed at this time.

## 2018-06-18 NOTE — Telephone Encounter (Signed)
Burman Nieves - please advise if this paperwork has been addressed. Thanks.

## 2018-07-06 ENCOUNTER — Other Ambulatory Visit: Payer: Self-pay | Admitting: Pulmonary Disease

## 2018-07-13 ENCOUNTER — Ambulatory Visit (INDEPENDENT_AMBULATORY_CARE_PROVIDER_SITE_OTHER)
Admission: RE | Admit: 2018-07-13 | Discharge: 2018-07-13 | Disposition: A | Discharge status: Home / Self Care | Payer: Medicaid Other | Source: Ambulatory Visit | Attending: Adult Health | Admitting: Adult Health

## 2018-07-13 DIAGNOSIS — R911 Solitary pulmonary nodule: Secondary | ICD-10-CM | POA: Diagnosis not present

## 2018-07-14 ENCOUNTER — Encounter: Payer: Self-pay | Admitting: Adult Health

## 2018-07-14 ENCOUNTER — Other Ambulatory Visit: Payer: Self-pay | Admitting: Pulmonary Disease

## 2018-07-14 DIAGNOSIS — R918 Other nonspecific abnormal finding of lung field: Secondary | ICD-10-CM

## 2018-07-14 DIAGNOSIS — R911 Solitary pulmonary nodule: Secondary | ICD-10-CM

## 2018-07-14 HISTORY — DX: Other nonspecific abnormal finding of lung field: R91.8

## 2018-07-14 NOTE — Progress Notes (Signed)
Spoke with pt and notified of results per Tammy Parrett, NP. Pt verbalized understanding and denied any questions. 

## 2018-08-05 ENCOUNTER — Ambulatory Visit: Payer: Medicaid Other | Admitting: Pulmonary Disease

## 2018-08-30 ENCOUNTER — Encounter: Payer: Self-pay | Admitting: Internal Medicine

## 2018-08-30 ENCOUNTER — Telehealth: Payer: Self-pay | Admitting: Internal Medicine

## 2018-08-30 NOTE — Telephone Encounter (Signed)
New referral received from Dr. Rex Kras for thrombocytopenia. Pt has been scheduled to see Dr. Walden Field on 1/23 at 1050am. Pt aware to arrive 30 minutes. Letter mailed.

## 2018-09-08 ENCOUNTER — Ambulatory Visit (INDEPENDENT_AMBULATORY_CARE_PROVIDER_SITE_OTHER): Payer: Medicaid Other | Admitting: Pulmonary Disease

## 2018-09-08 ENCOUNTER — Encounter: Payer: Self-pay | Admitting: Pulmonary Disease

## 2018-09-08 VITALS — BP 126/74 | HR 80 | Ht 64.0 in | Wt 104.0 lb

## 2018-09-08 DIAGNOSIS — J441 Chronic obstructive pulmonary disease with (acute) exacerbation: Secondary | ICD-10-CM | POA: Diagnosis not present

## 2018-09-08 DIAGNOSIS — J9611 Chronic respiratory failure with hypoxia: Secondary | ICD-10-CM | POA: Diagnosis not present

## 2018-09-08 DIAGNOSIS — R918 Other nonspecific abnormal finding of lung field: Secondary | ICD-10-CM | POA: Diagnosis not present

## 2018-09-08 DIAGNOSIS — R911 Solitary pulmonary nodule: Secondary | ICD-10-CM | POA: Diagnosis not present

## 2018-09-08 MED ORDER — DOXYCYCLINE HYCLATE 100 MG PO TABS: 100.0000 mg | ORAL_TABLET | Freq: Two times a day (BID) | ORAL | 0 refills | Status: DC

## 2018-09-08 MED ORDER — PREDNISONE 20 MG PO TABS: 20.0000 mg | ORAL_TABLET | Freq: Every day | ORAL | 0 refills | Status: DC

## 2018-09-08 MED ORDER — IPRATROPIUM-ALBUTEROL 0.5-2.5 (3) MG/3ML IN SOLN: 3.0000 mL | Freq: Four times a day (QID) | RESPIRATORY_TRACT | 5 refills | Status: DC | PRN

## 2018-09-08 NOTE — Addendum Note (Signed)
Addended by: Len Blalock on: 09/08/2018 10:23 AM   Modules accepted: Orders

## 2018-09-08 NOTE — Addendum Note (Signed)
Addended by: Len Blalock on: 09/08/2018 10:45 AM   Modules accepted: Orders

## 2018-09-08 NOTE — Progress Notes (Signed)
Subjective:    Patient ID: Cheryl Haley, female    DOB: 12-25-1955, 63 y.o.   MRN: [id number]  Synopsis: Former patient of Dr. Gwenette Greet first seen by [facility] in August 2016, she has COPD. Hospitalized for COPD in 09/2013 Cigarette smoker, cut back in 2016, quit in July 2018 after smoking 40 pack years  No change with spiriva or incruse.   In November 2018 she had a pulmonary nodule which was worrisome for malignancy.  Navigational biopsies were performed which showed granulomatous inflammation.  HPI Chief Complaint  Patient presents with  . Follow-up    pt c/o increased sob, chest and sinus congestion, prod cough with clear mucus X1-2 weeks.   Lona has been sick lately, coughing up mucus, can't breathe.  She says she can't walk any further than about 15-20 feet without stopping.  No fever or chills.    No body aches, headache.  She has a runny nose.    She still takes her Symbicort.  She says that the Trelegy is not covered by Medicaid.    She is coughing up clear mucus.    Past Medical History:  Diagnosis Date  . Anxiety   . 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 COPD (chronic obstructive pulmonary disease) ([place])   . History of hiatal hernia   . Hx of TIA (transient ischemic attack) and stroke   . Hypertension   . Hypothyroidism   . Pneumonia   . Thyroid disease       Review of Systems  Constitutional: Negative for chills, fatigue and fever.  HENT: Negative for postnasal drip, rhinorrhea and sinus pressure.   Respiratory: Positive for shortness of breath. Negative for choking and wheezing.   Cardiovascular: Negative for chest pain, palpitations and leg swelling.       Objective:   Physical Exam Vitals:   09/08/18 0955  BP: 126/74  Pulse: 80  SpO2: 90%  Weight: 104 lb (47.2 kg)  Height: _0  (1.626 m)    RA  Gen: chronically ill appearing, speaking in full sentences HENT: OP clear, TM's clear, neck supple PULM: Wheezing, poor air movement B, normal  percussion CV: RRR, no mgr, trace edema GI: BS+, soft, nontender Derm: no cyanosis or rash Psyche: normal mood and affect   PFT: [name] 12/26/13:  FEV1 0.81 (33%), Ratio 41.  Chest imaging: 05/2014 CXR showed a small R pleural effusion, images independently reviewed August 2018 [facility] reviewed showing patchy infiltrates in the right upper lobe, question small right lower lobe effusion November 2018 CT chest several pulmonary nodules, the largest of which is greater than 1 cm in the left upper lobe November 2018 PET/CT 1 cm left upper lobe nodule is FDG avid 5.07 July 2018 CT chest images independently reviewed showing significant emphysema, new nodules noted in the right upper lobe and right lower lobe, one 11 mm in size, 1 8 mm in size, radiology felt that these were inflammatory and recommended follow-up in 3 to 6 months  Pathology: July 29, 2017 surgical pathology left upper lobe biopsy chronic inflammation July 29, 2017 cytology from BAL brushings, needle biopsy: No malignant cells, some atypical cells seen on brushings which were favored to not represent malignancy   CBC    Component Value Date/Time   WBC 6.3 07/28/2017 0827   RBC 4.90 07/28/2017 0827   HGB 14.8 07/28/2017 0827   HCT 44.1 07/28/2017 0827   PLT 400 07/28/2017 0827  MCV 90.0 07/28/2017 0827   MCH 30.2 07/28/2017 0827   MCHC 33.6 07/28/2017 0827   RDW 15.7 (H) 07/28/2017 0827   LYMPHSABS 2.0 06/25/2017 0930   MONOABS 0.6 06/25/2017 0930   EOSABS 0.1 06/25/2017 0930   BASOSABS 0.0 06/25/2017 0930       Assessment & Plan:   Chronic respiratory failure with hypoxia ([place]) - Plan: Ambulatory Referral for DME  Pulmonary nodules  COPD with acute exacerbation ([place])  Discussion: Jamala is having a flare up of her severe COPD with emphysema and feels quite sick right now.  She says that her brother was diagnosed with bronchitis and was hospitalized a  week ago.  He was ruled out for the flu.  She does not have flulike symptoms and that she denies fevers headache or body ache.  We need to add a long-acting muscarinic agonist, she was unable to fill the prescription for Trelegy because it was too expensive.  Right now she is using Symbicort alone as a controller.  Plan: Severe COPD with acute exacerbation Take prednisone 20 mg daily x5 days Take doxycycline 100 mg twice a day x5 days Keep using Symbicort 2 puffs twice a day Use DuoNeb 3 times a day until you see [place] next week When you come back next week we will consider adding Spiriva HandiHaler 1 puff daily  Pulmonary nodule seen on recent CT scan of the chest: We will repeat a CT scan of your chest in February to evaluate these further  Acute respiratory failure with hypoxemia: Start using 2 L of oxygen continuously no matter how you feel  If you get worse you need to go to the emergency room  We will see you back in 1 week with a nurse practitioner to make sure things are getting better.  At that point we will likely start you on Spiriva 1 puff daily.   Current Outpatient Medications:  .  aspirin EC 81 MG tablet, Take 81 mg daily by mouth. , Disp: , Rfl:  .  budesonide-formoterol (SYMBICORT) 160-4.5 MCG/ACT inhaler, Inhale 2 puffs into the lungs 2 (two) times daily., Disp: 1 Inhaler, Rfl: 0 .  diltiazem (CARDIZEM) 90 MG tablet, Take 135 mg 2 (two) times daily by mouth., Disp: , Rfl:  .  Fluticasone-Umeclidin-Vilant (TRELEGY ELLIPTA) 100-62.5-25 MCG/INH AEPB, Inhale 1 puff into the lungs daily., Disp: 60 each, Rfl: 11 .  ipratropium (ATROVENT HFA) 17 MCG/ACT inhaler, Inhale 2 puffs into the lungs 4 (four) times daily., Disp: 3 Inhaler, Rfl: 6 .  levothyroxine (SYNTHROID, LEVOTHROID) 100 MCG tablet, Take 100 mcg daily before breakfast by mouth., Disp: , Rfl:  .  metoprolol succinate (TOPROL-XL) 25 MG 24 hr tablet, Take 25 mg daily by mouth., Disp: , Rfl:  .  Multiple Vitamin  (MULTIVITAMIN) tablet, Take 1 tablet by mouth daily., Disp: , Rfl:  .  NON FORMULARY, Pt wears 02 2lpm continuous.  Is to wear with exertion and at night., Disp: , Rfl:  .  rosuvastatin (CRESTOR) 5 MG tablet, Take 5 mg by mouth daily., Disp: , Rfl:  .  Spacer/Aero-Holding Chambers (AEROCHAMBER MV) inhaler, Use as instructed, Disp: 1 each, Rfl: 0 .  SYMBICORT 160-4.5 MCG/ACT inhaler, INHALE 2 PUFFS INTO LUNGS TWICE DAILY, Disp: 10.2 g, Rfl: 3 .  doxycycline (VIBRA-TABS) 100 MG tablet, Take 1 tablet (100 mg total) by mouth 2 (two) times daily., Disp: 10 tablet, Rfl: 0 .  ipratropium-albuterol (DUONEB) 0.5-2.5 (3) MG/3ML SOLN, Take 3 mLs by nebulization every 6 (  six) hours as needed., Disp: 360 mL, Rfl: 5 .  predniSONE (DELTASONE) 20 MG tablet, Take 1 tablet (20 mg total) by mouth daily with breakfast., Disp: 5 tablet, Rfl: 0

## 2018-09-08 NOTE — Patient Instructions (Signed)
Severe COPD with acute exacerbation Take prednisone 20 mg daily x5 days Take doxycycline 100 mg twice a day x5 days Keep using Symbicort 2 puffs twice a day Use DuoNeb 3 times a day until you see [place] next week When you come back next week we will consider adding Spiriva HandiHaler 1 puff daily  Pulmonary nodule seen on recent CT scan of the chest: We will repeat a CT scan of your chest in February to evaluate these further  Acute respiratory failure with hypoxemia: Start using 2 L of oxygen continuously no matter how you feel  If you get worse you need to go to the emergency room  We will see you back in 1 week with a nurse practitioner to make sure things are getting better.  At that point we will likely start you on Spiriva 1 puff daily.

## 2018-09-10 ENCOUNTER — Telehealth: Payer: Self-pay | Admitting: Pulmonary Disease

## 2018-09-10 NOTE — Telephone Encounter (Signed)
ATC pt, no answer. Left message for pt to call back.  

## 2018-09-13 NOTE — Telephone Encounter (Signed)
Pt aware that she will need to be retested at her OV follow up with Derl Barrow, NP. Nothing more needed at this time and pt will continue to use O2.

## 2018-09-15 ENCOUNTER — Other Ambulatory Visit: Payer: Self-pay

## 2018-09-15 ENCOUNTER — Encounter (HOSPITAL_COMMUNITY): Payer: Self-pay | Admitting: Emergency Medicine

## 2018-09-15 ENCOUNTER — Emergency Department (HOSPITAL_COMMUNITY): Payer: Medicaid Other

## 2018-09-15 ENCOUNTER — Inpatient Hospital Stay (HOSPITAL_COMMUNITY)
Admission: EM | Admit: 2018-09-15 | Discharge: 2018-09-19 | DRG: 190 | Disposition: A | Discharge status: Home / Self Care | Payer: Medicaid Other | Attending: Internal Medicine | Admitting: Internal Medicine

## 2018-09-15 DIAGNOSIS — Z8249 Family history of ischemic heart disease and other diseases of the circulatory system: Secondary | ICD-10-CM

## 2018-09-15 DIAGNOSIS — Z7951 Long term (current) use of inhaled steroids: Secondary | ICD-10-CM | POA: Diagnosis not present

## 2018-09-15 DIAGNOSIS — J441 Chronic obstructive pulmonary disease with (acute) exacerbation: Secondary | ICD-10-CM | POA: Diagnosis present

## 2018-09-15 DIAGNOSIS — Z825 Family history of asthma and other chronic lower respiratory diseases: Secondary | ICD-10-CM

## 2018-09-15 DIAGNOSIS — Z79890 Hormone replacement therapy (postmenopausal): Secondary | ICD-10-CM

## 2018-09-15 DIAGNOSIS — E43 Unspecified severe protein-calorie malnutrition: Secondary | ICD-10-CM | POA: Diagnosis present

## 2018-09-15 DIAGNOSIS — Z9981 Dependence on supplemental oxygen: Secondary | ICD-10-CM

## 2018-09-15 DIAGNOSIS — Z720 Tobacco use: Secondary | ICD-10-CM | POA: Diagnosis not present

## 2018-09-15 DIAGNOSIS — R918 Other nonspecific abnormal finding of lung field: Secondary | ICD-10-CM | POA: Diagnosis not present

## 2018-09-15 DIAGNOSIS — Z87891 Personal history of nicotine dependence: Secondary | ICD-10-CM

## 2018-09-15 DIAGNOSIS — Z681 Body mass index (BMI) 19 or less, adult: Secondary | ICD-10-CM | POA: Diagnosis not present

## 2018-09-15 DIAGNOSIS — J9611 Chronic respiratory failure with hypoxia: Secondary | ICD-10-CM | POA: Diagnosis not present

## 2018-09-15 DIAGNOSIS — E039 Hypothyroidism, unspecified: Secondary | ICD-10-CM | POA: Diagnosis present

## 2018-09-15 DIAGNOSIS — J988 Other specified respiratory disorders: Secondary | ICD-10-CM | POA: Diagnosis not present

## 2018-09-15 DIAGNOSIS — J9621 Acute and chronic respiratory failure with hypoxia: Secondary | ICD-10-CM | POA: Diagnosis present

## 2018-09-15 DIAGNOSIS — J439 Emphysema, unspecified: Secondary | ICD-10-CM | POA: Diagnosis not present

## 2018-09-15 DIAGNOSIS — Z79899 Other long term (current) drug therapy: Secondary | ICD-10-CM | POA: Diagnosis not present

## 2018-09-15 DIAGNOSIS — Z8673 Personal history of transient ischemic attack (TIA), and cerebral infarction without residual deficits: Secondary | ICD-10-CM | POA: Diagnosis not present

## 2018-09-15 DIAGNOSIS — I10 Essential (primary) hypertension: Secondary | ICD-10-CM | POA: Diagnosis present

## 2018-09-15 DIAGNOSIS — B9789 Other viral agents as the cause of diseases classified elsewhere: Secondary | ICD-10-CM | POA: Diagnosis present

## 2018-09-15 DIAGNOSIS — Z7982 Long term (current) use of aspirin: Secondary | ICD-10-CM | POA: Diagnosis not present

## 2018-09-15 LAB — BLOOD GAS, VENOUS
Acid-Base Excess: 6.1 mmol/L — ABNORMAL HIGH (ref 0.0–2.0)
Bicarbonate: 32.2 mmol/L — ABNORMAL HIGH (ref 20.0–28.0)
O2 Content: 2 L/min
O2 Saturation: 80.2 %
Patient temperature: 97.7
pCO2, Ven: 52.7 mmHg (ref 44.0–60.0)
pH, Ven: 7.401 (ref 7.250–7.430)
pO2, Ven: 46.3 mmHg — ABNORMAL HIGH (ref 32.0–45.0)

## 2018-09-15 LAB — CBC WITH DIFFERENTIAL/PLATELET
Abs Immature Granulocytes: 0.03 10*3/uL (ref 0.00–0.07)
Basophils Absolute: 0.1 10*3/uL (ref 0.0–0.1)
Basophils Relative: 1 %
EOS PCT: 1 %
Eosinophils Absolute: 0.1 10*3/uL (ref 0.0–0.5)
HCT: 45.9 % (ref 36.0–46.0)
Hemoglobin: 14.6 g/dL (ref 12.0–15.0)
Immature Granulocytes: 0 %
Lymphocytes Relative: 30 %
Lymphs Abs: 2.7 10*3/uL (ref 0.7–4.0)
MCH: 30.5 pg (ref 26.0–34.0)
MCHC: 31.8 g/dL (ref 30.0–36.0)
MCV: 96 fL (ref 80.0–100.0)
Monocytes Absolute: 0.8 10*3/uL (ref 0.1–1.0)
Monocytes Relative: 9 %
Neutro Abs: 5.4 10*3/uL (ref 1.7–7.7)
Neutrophils Relative %: 59 %
Platelets: 515 10*3/uL — ABNORMAL HIGH (ref 150–400)
RBC: 4.78 MIL/uL (ref 3.87–5.11)
RDW: 14.3 % (ref 11.5–15.5)
WBC: 9.1 10*3/uL (ref 4.0–10.5)
nRBC: 0 % (ref 0.0–0.2)

## 2018-09-15 LAB — BASIC METABOLIC PANEL
Anion gap: 11 (ref 5–15)
BUN: 17 mg/dL (ref 8–23)
CO2: 31 mmol/L (ref 22–32)
CREATININE: 0.68 mg/dL (ref 0.44–1.00)
Calcium: 9.6 mg/dL (ref 8.9–10.3)
Chloride: 95 mmol/L — ABNORMAL LOW (ref 98–111)
GFR calc Af Amer: >60 mL/min (ref >60)
GLUCOSE: 96 mg/dL (ref 70–99)
Potassium: 4.3 mmol/L (ref 3.5–5.1)
Sodium: 137 mmol/L (ref 135–145)

## 2018-09-15 MED ORDER — ALBUTEROL SULFATE (2.5 MG/3ML) 0.083% IN NEBU
5.0000 mg | INHALATION_SOLUTION | Freq: Once | RESPIRATORY_TRACT | Status: AC
  Administered 2018-09-15: 5 mg via RESPIRATORY_TRACT
  Filled 2018-09-15: qty 6

## 2018-09-15 MED ORDER — BISOPROLOL FUMARATE 5 MG PO TABS
5.0000 mg | ORAL_TABLET | Freq: Every day | ORAL | Status: DC
  Administered 2018-09-16 – 2018-09-17 (×2): 5 mg via ORAL
  Filled 2018-09-15 (×2): qty 1

## 2018-09-15 NOTE — ED Notes (Signed)
ED TO INPATIENT HANDOFF REPORT  Name/Age/Gender Cheryl Haley 63 y.o. female  Code Status Code Status History    Date Active Date Inactive Code Status Order ID Comments User Context   09/20/2013 2110 09/22/2013 2020 Full Code [id number]  Robbie Lis, MD Inpatient    Advance Directive Documentation     Most Recent Value  Type of Advance Directive  Healthcare Power of Attorney, Living will  Pre-existing out of facility DNR order (yellow form or pink MOST form)  -  "MOST" Form in Place?  -      Home/SNF/Other Home  Chief Complaint resp diff  Level of Care/Admitting Diagnosis ED Disposition    ED Disposition Condition [place]: [place] [[id number]]  Level of Care: Med-Surg [16]  Diagnosis: COPD exacerbation ([place]) [[id number]]  Admitting Physician: Toy Baker [[id number]]  Attending Physician: Toy Baker [[id number]]  Estimated length of stay: 3 - 4 days  Certification:: I certify this patient will need inpatient services for at least 2 midnights  PT Class (Do Not Modify): Inpatient [101]  PT Acc Code (Do Not Modify): Private [1]       Medical History Past Medical History:  Diagnosis Date  . Anxiety   . 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 COPD (chronic obstructive pulmonary disease) ([place])   . History of hiatal hernia   . Hx of TIA (transient ischemic attack) and stroke   . Hypertension   . Hypothyroidism   . Pneumonia   . Thyroid disease     Allergies No Known Allergies  IV Location/Drains/Wounds Patient Lines/Drains/Airways Status   Active Line/Drains/Airways    Name:   Placement date:   Placement time:   Site:   Days:   Peripheral IV 09/15/18 Right Antecubital   09/15/18    1823    Antecubital   less than 1          Labs/Imaging Results for orders placed or performed during the hospital encounter of 09/15/18 (from the past 48 hour(s))  Basic metabolic panel     Status: Abnormal   Collection Time: 09/15/18  5:05  PM  Result Value Ref Range   Sodium 137 135 - 145 mmol/L   Potassium 4.3 3.5 - 5.1 mmol/L   Chloride 95 (L) 98 - 111 mmol/L   CO2 31 22 - 32 mmol/L   Glucose, Bld 96 70 - 99 mg/dL   BUN 17 8 - 23 mg/dL   Creatinine, Ser 0.68 0.44 - 1.00 mg/dL   Calcium 9.6 8.9 - 10.3 mg/dL   GFR calc non Af Amer >60 >60 mL/min   GFR calc Af Amer >60 >60 mL/min   Anion gap 11 5 - 15    Comment: Performed at [facility], [place] [address] [place], [place] [address]  CBC with Differential     Status: Abnormal   Collection Time: 09/15/18  5:05 PM  Result Value Ref Range   WBC 9.1 4.0 - 10.5 K/uL   RBC 4.78 3.87 - 5.11 MIL/uL   Hemoglobin 14.6 12.0 - 15.0 g/dL   HCT 45.9 36.0 - 46.0 %   MCV 96.0 80.0 - 100.0 fL   MCH 30.5 26.0 - 34.0 pg   MCHC 31.8 30.0 - 36.0 g/dL   RDW 14.3 11.5 - 15.5 %   Platelets 515 (H) 150 - 400 K/uL   nRBC 0.0 0.0 - 0.2 %   Neutrophils Relative % 59 %   Neutro  Abs 5.4 1.7 - 7.7 K/uL   Lymphocytes Relative 30 %   Lymphs Abs 2.7 0.7 - 4.0 K/uL   Monocytes Relative 9 %   Monocytes Absolute 0.8 0.1 - 1.0 K/uL   Eosinophils Relative 1 %   Eosinophils Absolute 0.1 0.0 - 0.5 K/uL   Basophils Relative 1 %   Basophils Absolute 0.1 0.0 - 0.1 K/uL   Immature Granulocytes 0 %   Abs Immature Granulocytes 0.03 0.00 - 0.07 K/uL    Comment: Performed at [facility], [place] [address], [place], [place] [address]  Blood gas, venous     Status: Abnormal   Collection Time: 09/15/18  7:50 PM  Result Value Ref Range   O2 Content 2.0 L/min   Delivery systems NASAL CANNULA    pH, Ven 7.401 7.250 - 7.430   pCO2, Ven 52.7 44.0 - 60.0 mmHg   pO2, Ven 46.3 (H) 32.0 - 45.0 mmHg   Bicarbonate 32.2 (H) 20.0 - 28.0 mmol/L   Acid-Base Excess 6.1 (H) 0.0 - 2.0 mmol/L   O2 Saturation 80.2 %   Patient temperature 97.7    Collection site VEIN    Drawn by COLLECTED BY NURSE    Sample type VENOUS     Comment: Performed at [place]  [address], [place], [place] [address]   Dg Chest 2 View  Result Date: 09/15/2018 CLINICAL DATA:  Two week history of shortness of breath that acutely worsened yesterday. Patient recently completed antibiotic and corticosteroid therapy. EXAM: CHEST - 2 VIEW COMPARISON:  CT chest 07/13/2018 and earlier. Chest x-rays 07/29/2017 and earlier. FINDINGS: Cardiac silhouette upper normal in size, unchanged. Thoracic aorta atherosclerotic, unchanged. Hilar and mediastinal contours otherwise unremarkable. Emphysematous changes throughout both lungs with marked hyperinflation. Pleuroparenchymal scarring at the RIGHT base accounting for the blunted costophrenic angle. RIGHT lung nodules identified on the prior CT are no longer visible and presumably represented infectious etiology. Prominent bronchovascular markings diffusely and moderate central peribronchial thickening, unchanged to perhaps slightly less than on prior examinations. Lungs otherwise clear. No localized airspace consolidation. No pleural effusions. No pneumothorax. Normal pulmonary vascularity. Visualized bony thorax intact. IMPRESSION: 1. No acute cardiopulmonary disease. 2. Aortic Atherosclerosis (ICD10-I70.0) and Emphysema (ICD10-J43.9). Electronically Signed   By: Evangeline Dakin M.D.   On: 09/15/2018 13:42   EKG Interpretation  Date/Time:  Wednesday September 15 2018 11:48:42 EST Ventricular Rate:  72 PR Interval:    QRS Duration: 108 QT Interval:  400 QTC Calculation: 438 R Axis:   93 Text Interpretation:  Sinus rhythm RAE, consider biatrial enlargement Right axis deviation Baseline wander in lead(s) II No significant change since last tracing Confirmed by Dorie Rank [phone]) on 09/15/2018 11:55:01 AM Also confirmed by Dorie Rank [phone]), editor Philomena Doheny [phone])  on 09/15/2018 3:11:26 PM   Pending Labs Unresulted Labs (From admission, onward)    Start     Ordered   Signed and Held  HIV antibody  Tomorrow morning,   R     Signed and Held    Signed and Held  Respiratory Panel by PCR  (Respiratory virus panel with precautions)  Once,   R     Signed and Held   Signed and Held  Magnesium  Tomorrow morning,   R    Comments:  Call MD if <1.5    Signed and Held   Signed and Held  Phosphorus  Tomorrow morning,   R     Signed and Held  Signed and Held  TSH  Once,   R    Comments:  Cancel if already done within 1 month and notify MD    Signed and Held   Signed and Held  Comprehensive metabolic panel  Once,   R    Comments:  Cal MD for K<3.5 or >5.0    Signed and Held   Signed and Held  CBC  Once,   R    Comments:  Call for hg <8.0    Signed and Held          Vitals/Pain Today's Vitals   09/15/18 2200 09/15/18 2300 09/15/18 2330 09/15/18 2341  BP:    119/60  Pulse: 69 (!) 58 (!) 57 64  Resp: (!) 27 (!) 27 (!) 26 18  Temp:    98.1 F (36.7 C)  TempSrc:    Oral  SpO2: 98% 96% 97% 95%  Weight:      Height:      PainSc:        Isolation Precautions No active isolations  Medications Medications  bisoprolol (ZEBETA) tablet 5 mg (has no administration in time range)  albuterol (PROVENTIL) (2.5 MG/3ML) 0.083% nebulizer solution 5 mg (5 mg Nebulization Given 09/15/18 1210)    Mobility walks

## 2018-09-15 NOTE — ED Triage Notes (Signed)
Pt c/o shortness of breath x 2 weeks that has worsened over the past day. Pt has last used neb treatment yesterday, pt just finished prednisone and antibiotic, pt not in distress in triage, was placed on 2L by pulmonologist.

## 2018-09-15 NOTE — ED Provider Notes (Signed)
[place] DEPT Provider Note   CSN: [id number] Arrival date & time: 09/15/18  1127     History   Chief Complaint Chief Complaint  Patient presents with  . Shortness of Breath    HPI Cheryl Haley is a 63 y.o. female.  HPI Patient presents with shortness of breath.  Has had over the last 2 weeks.  History of severe COPD.  Saw [name] about a week ago.  Started on prednisone at 20 mg for 5 days.  Also given antibiotics.  Continues shortness of breath or may be has worsened.  States she cannot walk at home.  Had oxygen as needed but is gone up to around-the-clock now.  Worse today.  No chest pain.  Still has some mild sputum production.  No swelling in her legs.  Gets dizzy when she attempts to walk. Past Medical History:  Diagnosis Date  . Anxiety   . 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 COPD (chronic obstructive pulmonary disease) ([place])   . History of hiatal hernia   . Hx of TIA (transient ischemic attack) and stroke   . Hypertension   . Hypothyroidism   . Pneumonia   . Thyroid disease     Patient Active Problem List   Diagnosis Date Noted  . Pulmonary nodules 07/14/2018  . Pulmonary nodule, left   . Abnormal breath sounds 12/08/2016  . Chronic respiratory failure with hypoxia ([place]) 06/10/2016  . Tobacco abuse 11/28/2015  . Pleural effusion 05/15/2014  . 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 COPD with acute exacerbation ([place]) 09/20/2013  . HTN (hypertension) 09/20/2013  . Unspecified hypothyroidism 09/20/2013    Past Surgical History:  Procedure Laterality Date  . ESOPHAGOGASTRODUODENOSCOPY    . VIDEO BRONCHOSCOPY WITH ENDOBRONCHIAL NAVIGATION Left 07/29/2017   Procedure: VIDEO BRONCHOSCOPY WITH ENDOBRONCHIAL NAVIGATION;  Surgeon: Collene Gobble, MD;  Location: [facility] OR;  Service: Thoracic;  Laterality: Left;     OB History   No obstetric history on file.      Home Medications    Prior to  Admission medications   Medication Sig Start Date End Date Taking? Authorizing Provider  aspirin EC 81 MG tablet Take 81 mg daily by mouth.    Yes [provider]  diltiazem (CARDIZEM) 90 MG tablet Take 135 mg 2 (two) times daily by mouth.   Yes [provider]  ipratropium (ATROVENT HFA) 17 MCG/ACT inhaler Inhale 2 puffs into the lungs 4 (four) times daily. 02/03/18  Yes Parrett, Tammy S, NP  ipratropium-albuterol (DUONEB) 0.5-2.5 (3) MG/3ML SOLN Take 3 mLs by nebulization every 6 (six) hours as needed. Patient taking differently: Take 3 mLs by nebulization every 6 (six) hours as needed (sob and wheezing).  09/08/18  Yes Juanito Doom, MD  levothyroxine (SYNTHROID, LEVOTHROID) 100 MCG tablet Take 100 mcg daily before breakfast by mouth.   Yes [provider]  metoprolol succinate (TOPROL-XL) 25 MG 24 hr tablet Take 25 mg daily by mouth.   Yes [provider]  Multiple Vitamin (MULTIVITAMIN) tablet Take 1 tablet by mouth daily.   Yes [provider]  NON FORMULARY Pt wears 02 2lpm continuous.  Is to wear with exertion and at night.   Yes [provider]  rosuvastatin (CRESTOR) 5 MG tablet Take 5 mg by mouth daily.   Yes [provider]  SYMBICORT 160-4.5 MCG/ACT inhaler INHALE 2 PUFFS INTO LUNGS TWICE DAILY Patient taking differently: Inhale 2 puffs  into the lungs 2 (two) times daily.  07/06/18  Yes Juanito Doom, MD  budesonide-formoterol (SYMBICORT) 160-4.5 MCG/ACT inhaler Inhale 2 puffs into the lungs 2 (two) times daily. Patient not taking: Reported on 09/15/2018 01/28/18   Parrett, Fonnie Mu, NP  doxycycline (VIBRA-TABS) 100 MG tablet Take 1 tablet (100 mg total) by mouth 2 (two) times daily. Patient not taking: Reported on 09/15/2018 09/08/18   Juanito Doom, MD  Fluticasone-Umeclidin-Vilant (TRELEGY ELLIPTA) 100-62.5-25 MCG/INH AEPB Inhale 1 puff into the lungs daily. Patient not taking: Reported on 09/15/2018 06/18/18    Juanito Doom, MD  predniSONE (DELTASONE) 20 MG tablet Take 1 tablet (20 mg total) by mouth daily with breakfast. Patient not taking: Reported on 09/15/2018 09/08/18   Juanito Doom, MD  Spacer/Aero-Holding Chambers (AEROCHAMBER MV) inhaler Use as instructed 06/25/17   Juanito Doom, MD    Family History Family History  Problem Relation Age of Onset  . Emphysema Father   . Heart disease Father   . Emphysema Brother   . Emphysema Sister   . Emphysema Sister   . Heart disease Mother   . Heart disease Sister   . Heart disease Brother   . Heart disease Brother   . Congestive Heart Failure Sister   . Congestive Heart Failure Sister   . Cancer Sister        lung  . Cancer Sister        breast    Social History Social History   Tobacco Use  . Smoking status: Former Smoker    Packs/day: 2.00    Years: 40.00    Pack years: 80.00    Types: Cigarettes    Last attempt to quit: 03/16/2017    Years since quitting: 1.5  . Smokeless tobacco: Never Used  . Tobacco comment:    Substance Use Topics  . Alcohol use: Yes    Alcohol/week: 14.0 standard drinks    Types: 14 Cans of beer per week  . Drug use: No     Allergies   Patient has no known allergies.   Review of Systems Review of Systems  Constitutional: Negative for appetite change.  HENT: Positive for congestion.   Respiratory: Positive for cough and shortness of breath.   Cardiovascular: Negative for chest pain and leg swelling.  Gastrointestinal: Negative for abdominal pain.  Genitourinary: Negative for flank pain.  Musculoskeletal: Negative for back pain.  Skin: Negative for rash.  Neurological: Positive for dizziness.  Psychiatric/Behavioral: Negative for confusion.     Physical Exam Updated Vital Signs BP (!) 148/68   Pulse 73   Temp 97.9 F (36.6 C) (Oral)   Resp (!) 25   Ht 5\' 4"  (1.626 m)   Wt 46.3 kg   SpO2 97%   BMI 17.51 kg/m   Physical Exam Neck:     Musculoskeletal: Neck  supple.  Cardiovascular:     Rate and Rhythm: Normal rate and regular rhythm.  Pulmonary:     Breath sounds: Decreased breath sounds present.     Comments: Decreased breath sounds.  Decreased air movement.  Mildly harsh breath sounds with some wheezes.  Dyspneic. Chest:     Chest wall: No mass or tenderness.  Abdominal:     Tenderness: There is no abdominal tenderness.  Musculoskeletal:     Right lower leg: No edema.     Left lower leg: No edema.  Skin:    General: Skin is warm.  Neurological:     General:  No focal deficit present.     Mental Status: She is alert.      ED Treatments / Results  Labs (all labs ordered are listed, but only abnormal results are displayed) Labs Reviewed  BASIC METABOLIC PANEL - Abnormal; Notable for the following components:      Result Value   Chloride 95 (*)    All other components within normal limits  CBC WITH DIFFERENTIAL/PLATELET - Abnormal; Notable for the following components:   Platelets 515 (*)    All other components within normal limits    EKG EKG Interpretation  Date/Time:  Wednesday September 15 2018 11:48:42 EST Ventricular Rate:  72 PR Interval:    QRS Duration: 108 QT Interval:  400 QTC Calculation: 438 R Axis:   93 Text Interpretation:  Sinus rhythm RAE, consider biatrial enlargement Right axis deviation Baseline wander in lead(s) II No significant change since last tracing Confirmed by Dorie Rank [phone]) on 09/15/2018 11:55:01 AM Also confirmed by Dorie Rank [phone]), editor Philomena Doheny [phone])  on 09/15/2018 3:11:26 PM   Radiology Dg Chest 2 View  Result Date: 09/15/2018 CLINICAL DATA:  Two week history of shortness of breath that acutely worsened yesterday. Patient recently completed antibiotic and corticosteroid therapy. EXAM: CHEST - 2 VIEW COMPARISON:  CT chest 07/13/2018 and earlier. Chest x-rays 07/29/2017 and earlier. FINDINGS: Cardiac silhouette upper normal in size, unchanged. Thoracic aorta atherosclerotic,  unchanged. Hilar and mediastinal contours otherwise unremarkable. Emphysematous changes throughout both lungs with marked hyperinflation. Pleuroparenchymal scarring at the RIGHT base accounting for the blunted costophrenic angle. RIGHT lung nodules identified on the prior CT are no longer visible and presumably represented infectious etiology. Prominent bronchovascular markings diffusely and moderate central peribronchial thickening, unchanged to perhaps slightly less than on prior examinations. Lungs otherwise clear. No localized airspace consolidation. No pleural effusions. No pneumothorax. Normal pulmonary vascularity. Visualized bony thorax intact. IMPRESSION: 1. No acute cardiopulmonary disease. 2. Aortic Atherosclerosis (ICD10-I70.0) and Emphysema (ICD10-J43.9). Electronically Signed   By: Evangeline Dakin M.D.   On: 09/15/2018 13:42    Procedures Procedures (including critical care time)  Medications Ordered in ED Medications  albuterol (PROVENTIL) (2.5 MG/3ML) 0.083% nebulizer solution 5 mg (5 mg Nebulization Given 09/15/18 1210)     Initial Impression / Assessment and Plan / ED Course  I have reviewed the triage vital signs and the nursing notes.  Pertinent labs & imaging results that were available during my care of the patient were reviewed by me and considered in my medical decision making (see chart for details).     Patient with shortness of breath.  Recently had her oxygen increased from as needed to around-the-clock.  Has been on steroids and antibiotics without improvement and perhaps worsening of the symptoms.  States she has been able to do many activities at home because of it she feels short of breath and dizzy.  Gets dyspneic with ambulation here.  Has to take breaks.  This is not her reported baseline.  Discussed with Dr. Loanne Drilling from pulmonary critical care.  Thinks patient will be admitted to internal medicine and they will consult in the morning.  Final Clinical  Impressions(s) / ED Diagnoses   Final diagnoses:  COPD exacerbation [facility])    ED Discharge Orders    None       Davonna Belling, MD 09/15/18 1902

## 2018-09-15 NOTE — ED Notes (Signed)
Bed: WA09 Expected date:  Expected time:  Means of arrival:  Comments: Triage 2

## 2018-09-15 NOTE — H&P (Signed)
Cheryl Haley VHQ:[id number] DOB: September 04, 1955 DOA: 09/15/2018     PCP: [name], MD   Outpatient Specialists:     Pulmonary [name]    Patient arrived to ER on 09/15/18 at 1127  Patient coming from: home Lives  With family Sister   Chief Complaint:  Chief Complaint  Patient presents with  . Shortness of Breath    HPI: Cheryl Haley is a 63 y.o. female with medical history significant of  severe COPD, history of TIA, HTN, hypothyroidism, luminary nodules pleural pleural effusion tobacco abuse    Presented with   severe shortness of breath progressive over the past 2 weeks seen her pulmonologist [name] for the last week and was started on prednisone taper for the past 5 days and antibiotics.  Doxycycline but continued to be shortness of breath and progressively gotten worse she states she cannot even walk without getting severe shortness of breath she usually on oxygen as needed but now has been using this continuously.  Increased sputum production no peripheral edema no syncope but she does feel somewhat lightheaded when she tries to walk. Reports clear sputum. Her brother had bronchitis recently She had her flu shot and PNA shot Denies any fever, no chest pain She quit smoking 1 year ago.   States this is the sickest she has been  Right now she is using Symbicort alone supposed to start [facility] could not afford Trelegy . On scheduled DuoNeb  Regarding pertinent Chronic problems  Of hypertension on metoprolol and Cardizem 1 history of pulmonary nodule seen a CT scan in a past plan to reimage in February  While in ER: Requiring up to 4 L of oxygen respirations up to 23 Given albuterol and nebulizer treatments in the emergency department Without significant improvement The following Work up has been ordered so far:  Orders Placed This Encounter  Procedures  . DG Chest 2 View  . Basic metabolic panel  . CBC with Differential  . If O2 Sat <94% administer  O2 at 2 liters/minute via nasal cannula  . Check Pulse Oximetry while ambulating  . Consult to intensivist  . Consult to hospitalist  . Pulse oximetry, continuous  . EKG 12-Lead  . ED EKG     Following Medications were ordered in ER: Medications  albuterol (PROVENTIL) (2.5 MG/3ML) 0.083% nebulizer solution 5 mg (5 mg Nebulization Given 09/15/18 1210)    Significant initial  Findings: Abnormal Labs Reviewed  BASIC METABOLIC PANEL - Abnormal; Notable for the following components:      Result Value   Chloride 95 (*)    All other components within normal limits  CBC WITH DIFFERENTIAL/PLATELET - Abnormal; Notable for the following components:   Platelets 515 (*)    All other components within normal limits     Lactic Acid, Venous No results found for: LATICACIDVEN  Na 137 K 4.3  Cr    Stable,  Lab Results  Component Value Date   CREATININE 0.68 09/15/2018   CREATININE 0.68 07/28/2017   CREATININE 0.60 05/03/2014      WBC  9.1  HG/HCT  Stable,     Component Value Date/Time   HGB 14.6 09/15/2018 1705   HCT 45.9 09/15/2018 1705         UA not ordered     CXR -  NON acute    ECG:  Personally reviewed by me showing: HR : 72 Rhythm:  NSR, biatrial enlargement  right axis deviation  no evidence of  ischemic changes QTC 3 8     ED Triage Vitals  Enc Vitals Group     BP 09/15/18 1149 (!) 171/71     Pulse Rate 09/15/18 1149 82     Resp 09/15/18 1149 15     Temp 09/15/18 1149 97.9 F (36.6 C)     Temp Source 09/15/18 1149 Oral     SpO2 09/15/18 1149 99 %     Weight 09/15/18 1149 102 lb (46.3 kg)     Height 09/15/18 1149 5\' 4"  (1.626 m)     Head Circumference --      Peak Flow --      Pain Score 09/15/18 1201 0     Pain Loc --      Pain Edu? --      Excl. in [place]? --   TMAX(24)@       Latest  Blood pressure 115/61, pulse 64, temperature 97.9 F (36.6 C), temperature source Oral, resp. rate (!) 23, height 5\' 4"  (1.626 m), weight 46.3 kg, SpO2 98  %.     ER Provider Called: DR Colin Rhein PCCM They Recommend admit to medicine for COPD exacerbation Will see in AM  Hospitalist was called for admission for COPD exacerbation   Review of Systems:    Pertinent positives include:  Fatigue  shortness of breath at rest.  dyspnea on exertion,  productive cough excess mucus,   Constitutional:  No weight loss, night sweats, Fevers, chills,, weight loss  HEENT:  No headaches, Difficulty swallowing,Tooth/dental problems,Sore throat,  No sneezing, itching, ear ache, nasal congestion, post nasal drip,  Cardio-vascular:  No chest pain, Orthopnea, PND, anasarca, dizziness, palpitations.no Bilateral lower extremity swelling  GI:  No heartburn, indigestion, abdominal pain, nausea, vomiting, diarrhea, change in bowel habits, loss of appetite, melena, blood in stool, hematemesis Resp:    no, No non-productive cough, No coughing up of blood.No change in color of mucus.No wheezing. Skin:  no rash or lesions. No jaundice GU:  no dysuria, change in color of urine, no urgency or frequency. No straining to urinate.  No flank pain.  Musculoskeletal:  No joint pain or no joint swelling. No decreased range of motion. No back pain.  Psych:  No change in mood or affect. No depression or anxiety. No memory loss.  Neuro: no localizing neurological complaints, no tingling, no weakness, no double vision, no gait abnormality, no slurred speech, no confusion  All systems reviewed and apart from [place] all are negative  Past Medical History:   Past Medical History:  Diagnosis Date  . Anxiety   . 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 COPD (chronic obstructive pulmonary disease) ([place])   . History of hiatal hernia   . Hx of TIA (transient ischemic attack) and stroke   . Hypertension   . Hypothyroidism   . Pneumonia   . Thyroid disease       Past Surgical History:  Procedure Laterality Date  . ESOPHAGOGASTRODUODENOSCOPY    . VIDEO BRONCHOSCOPY WITH  ENDOBRONCHIAL NAVIGATION Left 07/29/2017   Procedure: VIDEO BRONCHOSCOPY WITH ENDOBRONCHIAL NAVIGATION;  Surgeon: Collene Gobble, MD;  Location: [facility] OR;  Service: Thoracic;  Laterality: Left;    Social History:  Ambulatory  Independently      reports that she quit smoking about 18 months ago. Her smoking use included cigarettes. She has a 80.00 pack-year smoking history. She has never used smokeless tobacco. She reports current alcohol use of about 14.0 standard  drinks of alcohol per week. She reports that she does not use drugs.     Family History:   Family History  Problem Relation Age of Onset  . Emphysema Father   . Heart disease Father   . Emphysema Brother   . Emphysema Sister   . Emphysema Sister   . Heart disease Mother   . Heart disease Sister   . Heart disease Brother   . Heart disease Brother   . Congestive Heart Failure Sister   . Congestive Heart Failure Sister   . Cancer Sister        lung  . Cancer Sister        breast    Allergies: No Known Allergies   Prior to Admission medications   Medication Sig Start Date End Date Taking? Authorizing Provider  aspirin EC 81 MG tablet Take 81 mg daily by mouth.    Yes [provider]  diltiazem (CARDIZEM) 90 MG tablet Take 135 mg 2 (two) times daily by mouth.   Yes [provider]  ipratropium (ATROVENT HFA) 17 MCG/ACT inhaler Inhale 2 puffs into the lungs 4 (four) times daily. 02/03/18  Yes Parrett, Tammy S, NP  ipratropium-albuterol (DUONEB) 0.5-2.5 (3) MG/3ML SOLN Take 3 mLs by nebulization every 6 (six) hours as needed. Patient taking differently: Take 3 mLs by nebulization every 6 (six) hours as needed (sob and wheezing).  09/08/18  Yes Juanito Doom, MD  levothyroxine (SYNTHROID, LEVOTHROID) 100 MCG tablet Take 100 mcg daily before breakfast by mouth.   Yes [provider]  metoprolol succinate (TOPROL-XL) 25 MG 24 hr tablet Take 25 mg daily by mouth.   Yes [provider]   Multiple Vitamin (MULTIVITAMIN) tablet Take 1 tablet by mouth daily.   Yes [provider]  NON FORMULARY Pt wears 02 2lpm continuous.  Is to wear with exertion and at night.   Yes [provider]  rosuvastatin (CRESTOR) 5 MG tablet Take 5 mg by mouth daily.   Yes [provider]  SYMBICORT 160-4.5 MCG/ACT inhaler INHALE 2 PUFFS INTO LUNGS TWICE DAILY Patient taking differently: Inhale 2 puffs into the lungs 2 (two) times daily.  07/06/18  Yes Juanito Doom, MD  budesonide-formoterol (SYMBICORT) 160-4.5 MCG/ACT inhaler Inhale 2 puffs into the lungs 2 (two) times daily. Patient not taking: Reported on 09/15/2018 01/28/18   Parrett, Fonnie Mu, NP  doxycycline (VIBRA-TABS) 100 MG tablet Take 1 tablet (100 mg total) by mouth 2 (two) times daily. Patient not taking: Reported on 09/15/2018 09/08/18   Juanito Doom, MD  Fluticasone-Umeclidin-Vilant (TRELEGY ELLIPTA) 100-62.5-25 MCG/INH AEPB Inhale 1 puff into the lungs daily. Patient not taking: Reported on 09/15/2018 06/18/18   Juanito Doom, MD  predniSONE (DELTASONE) 20 MG tablet Take 1 tablet (20 mg total) by mouth daily with breakfast. Patient not taking: Reported on 09/15/2018 09/08/18   Juanito Doom, MD  Spacer/Aero-Holding Chambers (AEROCHAMBER MV) inhaler Use as instructed 06/25/17   Juanito Doom, MD   Physical Exam: Blood pressure 115/61, pulse 64, temperature 97.9 F (36.6 C), temperature source Oral, resp. rate (!) 23, height 5\' 4"  (1.626 m), weight 46.3 kg, SpO2 98 %. 1. General:  in No Acute distress   Chronically ill  -appearing 2. Psychological: Alert and   Oriented 3. Head/ENT:   Moist  Mucous Membranes  Head Non traumatic, neck supple                           Poor Dentition 4. SKIN: decreased Skin turgor,  Skin clean Dry and intact no rash 5. Heart: Regular rate and rhythm no  Murmur, no Rub or gallop 6. Lungs: diminished , no wheezes or crackles   7. Abdomen:  Soft non-tender, Non distended  bowel sounds present 8. Lower extremities: no clubbing, cyanosis, or  edema 9. Neurologically Grossly intact, moving all 4 extremities equally   10. MSK: Normal range of motion   LABS:     Recent Labs  Lab 09/15/18 1705  WBC 9.1  NEUTROABS 5.4  HGB 14.6  HCT 45.9  MCV 96.0  PLT 809*   Basic Metabolic Panel: Recent Labs  Lab 09/15/18 1705  NA 137  K 4.3  CL 95*  CO2 31  GLUCOSE 96  BUN 17  CREATININE 0.68  CALCIUM 9.6      No results for input(s): AST, ALT, ALKPHOS, BILITOT, PROT, ALBUMIN in the last 168 hours. No results for input(s): LIPASE, AMYLASE in the last 168 hours. No results for input(s): AMMONIA in the last 168 hours.    HbA1C: No results for input(s): HGBA1C in the last 72 hours. CBG: No results for input(s): GLUCAP in the last 168 hours.    Urine analysis: Not obtained   Cultures:    Component Value Date/Time   SDES BRONCHIAL WASHINGS LEFT UPPER LUNG 07/29/2017 0955   SPECREQUEST NONE 07/29/2017 0955   CULT NO GROWTH 2 DAYS 07/29/2017 0955   REPTSTATUS 07/31/2017 FINAL 07/29/2017 0955     Radiological Exams on Admission: Dg Chest 2 View  Result Date: 09/15/2018 CLINICAL DATA:  Two week history of shortness of breath that acutely worsened yesterday. Patient recently completed antibiotic and corticosteroid therapy. EXAM: CHEST - 2 VIEW COMPARISON:  CT chest 07/13/2018 and earlier. Chest x-rays 07/29/2017 and earlier. FINDINGS: Cardiac silhouette upper normal in size, unchanged. Thoracic aorta atherosclerotic, unchanged. Hilar and mediastinal contours otherwise unremarkable. Emphysematous changes throughout both lungs with marked hyperinflation. Pleuroparenchymal scarring at the RIGHT base accounting for the blunted costophrenic angle. RIGHT lung nodules identified on the prior CT are no longer visible and presumably represented infectious etiology. Prominent bronchovascular markings diffusely and moderate central  peribronchial thickening, unchanged to perhaps slightly less than on prior examinations. Lungs otherwise clear. No localized airspace consolidation. No pleural effusions. No pneumothorax. Normal pulmonary vascularity. Visualized bony thorax intact. IMPRESSION: 1. No acute cardiopulmonary disease. 2. Aortic Atherosclerosis (ICD10-I70.0) and Emphysema (ICD10-J43.9). Electronically Signed   By: Evangeline Dakin M.D.   On: 09/15/2018 13:42    Chart has been reviewed    Assessment/Plan   63 y.o. female with medical history significant of  severe COPD, history of TIA, HTN, hypothyroidism, luminary nodules pleural pleural effusion tobacco abuse Admitted for COPD exacerbation  Present on Admission: . COPD with acute exacerbation ([place]) -failure of outpatient management- Will initiate: Steroid taper  -  Antibiotics Rocephin - Albuterol   XopenexPRN, - scheduled duoneb,  -  Breo or Dulera at discharge   -  Mucinex.  Titrate O2 to saturation >90%. Follow patients respiratory status.  Order respiratory panel   - PCCM consulted ,    Currently mentating well no evidence of symptomatic hypercarbia  . HTN (hypertension) stable continue home medications hold beta-blockers as this can worsen bronchial spasm will change to Zebeta . Tobacco abuse currently in  remission . Chronic respiratory failure with hypoxia (HCC) acute on chronic exacerbation now requiring oxygen around-the-clock prior to discharge will have physical therapy ambulate to see if patient needs now oxygen with ambulation.  Prior to discharge . Pulmonary nodules follow-up as an outpatient with pulmonology      Other plan as per orders.  DVT prophylaxis:    Lovenox     Code Status: Limited code   BiPAP is ok, Yes On shock, yes on  Pressors    as per patient  DO NOT Intubate, no Chest compressions I had personally discussed CODE STATUS with patient    Family Communication:   Family   at  Bedside  plan of care was discussed with   Sister, Brother in law   Disposition Plan:         To home once workup is complete and patient is stable                  Would benefit from PT/OT eval prior to DC  Ordered                                      Consults called: Pulmonology is aware  Admission status:  inpatient     Expect 2 midnight stay secondary to severity of patient's current illness including   hemodynamic instability despite optimal treatment (   hypoxia, )   and extensive comorbidities including:     COPD  .    That are currently affecting medical management.   I expect  patient to be hospitalized for 2 midnights requiring inpatient medical care.  Patient is at high risk for adverse outcome (such as loss of life or disability) if not treated.  Indication for inpatient stay as follows:  Hemodynamic instability despite maximal medical therapy,        New or worsening hypoxia  Need for IV antibiotics, IV fluids, IV rate controling medications, IV antihypertensives, IV pain medications    Level of care      medical floor        Toy Baker 09/15/2018, 8:46 PM    [facility]     after 2 AM please page floor coverage PA If 7AM-7PM, please contact the day team taking care of the patient using [email or web address]

## 2018-09-16 ENCOUNTER — Other Ambulatory Visit: Payer: Self-pay

## 2018-09-16 DIAGNOSIS — B9789 Other viral agents as the cause of diseases classified elsewhere: Secondary | ICD-10-CM

## 2018-09-16 DIAGNOSIS — J988 Other specified respiratory disorders: Secondary | ICD-10-CM

## 2018-09-16 DIAGNOSIS — J439 Emphysema, unspecified: Secondary | ICD-10-CM

## 2018-09-16 HISTORY — DX: Other viral agents as the cause of diseases classified elsewhere: B97.89

## 2018-09-16 LAB — COMPREHENSIVE METABOLIC PANEL
ALT: 15 U/L (ref 0–44)
AST: 18 U/L (ref 15–41)
Albumin: 3.9 g/dL (ref 3.5–5.0)
Alkaline Phosphatase: 48 U/L (ref 38–126)
Anion gap: 10 (ref 5–15)
BUN: 16 mg/dL (ref 8–23)
CO2: 31 mmol/L (ref 22–32)
Calcium: 9 mg/dL (ref 8.9–10.3)
Chloride: 96 mmol/L — ABNORMAL LOW (ref 98–111)
Creatinine, Ser: 0.64 mg/dL (ref 0.44–1.00)
GFR calc Af Amer: >60 mL/min (ref >60)
GFR calc non Af Amer: >60 mL/min (ref >60)
Glucose, Bld: 101 mg/dL — ABNORMAL HIGH (ref 70–99)
Potassium: 4.4 mmol/L (ref 3.5–5.1)
Sodium: 137 mmol/L (ref 135–145)
Total Bilirubin: 0.6 mg/dL (ref 0.3–1.2)
Total Protein: 7 g/dL (ref 6.5–8.1)

## 2018-09-16 LAB — CBC
HCT: 41.1 % (ref 36.0–46.0)
Hemoglobin: 13 g/dL (ref 12.0–15.0)
MCH: 30 pg (ref 26.0–34.0)
MCHC: 31.6 g/dL (ref 30.0–36.0)
MCV: 94.7 fL (ref 80.0–100.0)
Platelets: 430 10*3/uL — ABNORMAL HIGH (ref 150–400)
RBC: 4.34 MIL/uL (ref 3.87–5.11)
RDW: 14.2 % (ref 11.5–15.5)
WBC: 6.9 10*3/uL (ref 4.0–10.5)
nRBC: 0 % (ref 0.0–0.2)

## 2018-09-16 LAB — RESPIRATORY PANEL BY PCR
Adenovirus: NOT DETECTED
Bordetella pertussis: NOT DETECTED
Chlamydophila pneumoniae: NOT DETECTED
Coronavirus 229E: NOT DETECTED
Coronavirus HKU1: NOT DETECTED
Coronavirus NL63: NOT DETECTED
Coronavirus OC43: NOT DETECTED
INFLUENZA A-RVPPCR: NOT DETECTED
Influenza B: NOT DETECTED
Metapneumovirus: DETECTED — AB
Mycoplasma pneumoniae: NOT DETECTED
Parainfluenza Virus 1: NOT DETECTED
Parainfluenza Virus 2: NOT DETECTED
Parainfluenza Virus 3: NOT DETECTED
Parainfluenza Virus 4: NOT DETECTED
Respiratory Syncytial Virus: NOT DETECTED
Rhinovirus / Enterovirus: NOT DETECTED

## 2018-09-16 LAB — PHOSPHORUS: PHOSPHORUS: 3.3 mg/dL (ref 2.5–4.6)

## 2018-09-16 LAB — HIV ANTIBODY (ROUTINE TESTING W REFLEX): HIV Screen 4th Generation wRfx: NONREACTIVE

## 2018-09-16 LAB — TSH: TSH: 1.547 u[IU]/mL (ref 0.350–4.500)

## 2018-09-16 LAB — MAGNESIUM: Magnesium: 2 mg/dL (ref 1.7–2.4)

## 2018-09-16 MED ORDER — METHYLPREDNISOLONE SODIUM SUCC 40 MG IJ SOLR
40.0000 mg | Freq: Two times a day (BID) | INTRAMUSCULAR | Status: AC
  Administered 2018-09-16 (×2): 40 mg via INTRAVENOUS
  Filled 2018-09-16 (×2): qty 1

## 2018-09-16 MED ORDER — GUAIFENESIN ER 600 MG PO TB12
600.0000 mg | ORAL_TABLET | Freq: Two times a day (BID) | ORAL | Status: DC
  Administered 2018-09-16 – 2018-09-19 (×8): 600 mg via ORAL
  Filled 2018-09-16 (×8): qty 1

## 2018-09-16 MED ORDER — ENSURE ENLIVE PO LIQD
237.0000 mL | Freq: Three times a day (TID) | ORAL | Status: DC
  Administered 2018-09-16 – 2018-09-19 (×7): 237 mL via ORAL

## 2018-09-16 MED ORDER — HYDROCODONE-ACETAMINOPHEN 5-325 MG PO TABS: 1.0000 | ORAL_TABLET | ORAL | Status: DC | PRN

## 2018-09-16 MED ORDER — SODIUM CHLORIDE 0.9 % IV SOLN
1.0000 g | Freq: Every day | INTRAVENOUS | Status: DC
  Administered 2018-09-16: 1 g via INTRAVENOUS
  Filled 2018-09-16: qty 10
  Filled 2018-09-16: qty 1

## 2018-09-16 MED ORDER — PREDNISONE 20 MG PO TABS
40.0000 mg | ORAL_TABLET | Freq: Every day | ORAL | Status: DC
  Administered 2018-09-17: 40 mg via ORAL
  Filled 2018-09-16: qty 2

## 2018-09-16 MED ORDER — ONDANSETRON HCL 4 MG/2ML IJ SOLN: 4.0000 mg | Freq: Four times a day (QID) | INTRAMUSCULAR | Status: DC | PRN

## 2018-09-16 MED ORDER — ONDANSETRON HCL 4 MG PO TABS: 4.0000 mg | ORAL_TABLET | Freq: Four times a day (QID) | ORAL | Status: DC | PRN

## 2018-09-16 MED ORDER — IPRATROPIUM-ALBUTEROL 0.5-2.5 (3) MG/3ML IN SOLN
3.0000 mL | Freq: Four times a day (QID) | RESPIRATORY_TRACT | Status: DC
  Administered 2018-09-16 (×4): 3 mL via RESPIRATORY_TRACT
  Filled 2018-09-16 (×4): qty 3

## 2018-09-16 MED ORDER — ACETAMINOPHEN 650 MG RE SUPP: 650.0000 mg | Freq: Four times a day (QID) | RECTAL | Status: DC | PRN

## 2018-09-16 MED ORDER — ROSUVASTATIN CALCIUM 5 MG PO TABS
5.0000 mg | ORAL_TABLET | Freq: Every day | ORAL | Status: DC
  Administered 2018-09-16 – 2018-09-19 (×4): 5 mg via ORAL
  Filled 2018-09-16 (×4): qty 1

## 2018-09-16 MED ORDER — ADULT MULTIVITAMIN W/MINERALS CH
1.0000 | ORAL_TABLET | Freq: Every day | ORAL | Status: DC
  Administered 2018-09-16 – 2018-09-19 (×4): 1 via ORAL
  Filled 2018-09-16 (×4): qty 1

## 2018-09-16 MED ORDER — SODIUM CHLORIDE 0.9 % IV SOLN
INTRAVENOUS | Status: AC
  Administered 2018-09-16: 01:00:00 via INTRAVENOUS

## 2018-09-16 MED ORDER — ASPIRIN EC 81 MG PO TBEC
81.0000 mg | DELAYED_RELEASE_TABLET | Freq: Every day | ORAL | Status: DC
  Administered 2018-09-16 – 2018-09-19 (×4): 81 mg via ORAL
  Filled 2018-09-16 (×4): qty 1

## 2018-09-16 MED ORDER — DILTIAZEM HCL 90 MG PO TABS
135.0000 mg | ORAL_TABLET | Freq: Two times a day (BID) | ORAL | Status: DC
  Administered 2018-09-16 – 2018-09-17 (×2): 135 mg via ORAL
  Filled 2018-09-16 (×4): qty 1.5

## 2018-09-16 MED ORDER — ACETAMINOPHEN 325 MG PO TABS: 650.0000 mg | ORAL_TABLET | Freq: Four times a day (QID) | ORAL | Status: DC | PRN

## 2018-09-16 MED ORDER — ENOXAPARIN SODIUM 40 MG/0.4ML ~~LOC~~ SOLN
40.0000 mg | Freq: Every day | SUBCUTANEOUS | Status: DC
  Administered 2018-09-16 – 2018-09-18 (×4): 40 mg via SUBCUTANEOUS
  Filled 2018-09-16 (×4): qty 0.4

## 2018-09-16 MED ORDER — LEVOTHYROXINE SODIUM 100 MCG PO TABS
100.0000 ug | ORAL_TABLET | Freq: Every day | ORAL | Status: DC
  Administered 2018-09-16 – 2018-09-19 (×4): 100 ug via ORAL
  Filled 2018-09-16 (×4): qty 1

## 2018-09-16 MED ORDER — LEVALBUTEROL HCL 0.63 MG/3ML IN NEBU
0.6300 mg | INHALATION_SOLUTION | Freq: Four times a day (QID) | RESPIRATORY_TRACT | Status: DC | PRN
  Filled 2018-09-16: qty 3

## 2018-09-16 MED ORDER — MOMETASONE FURO-FORMOTEROL FUM 200-5 MCG/ACT IN AERO
1.0000 | INHALATION_SPRAY | Freq: Two times a day (BID) | RESPIRATORY_TRACT | Status: DC
  Administered 2018-09-16: 1 via RESPIRATORY_TRACT
  Filled 2018-09-16 (×2): qty 8.8

## 2018-09-16 NOTE — Evaluation (Signed)
Physical Therapy Evaluation Patient Details Name: Cheryl Haley MRN: [id number] DOB: [birth year] Today's Date: 09/16/2018   History of Present Illness  63 yo female admitted with COPD exac. Hx of COPD  Clinical Impression  On eval, pt was Supervision level for mobility. She walked ~75 feet. O2 sat 87% on RA, 90% on 2L [place] with ambulation/activity. Dyspnea 2/4. Recommend daily ambulation in hallway with nursing supervision. Will follow. Do not anticipate any f/u PT needs.     Follow Up Recommendations No PT follow up    Equipment Recommendations  None recommended by PT    Recommendations for Other Services       Precautions / Restrictions Precautions Precautions: None Precaution Comments: monitor O2. Currently O2 dep Restrictions Weight Bearing Restrictions: No      Mobility  Bed Mobility Overal bed mobility: Independent                Transfers Overall transfer level: Modified independent                  Ambulation/Gait Ambulation/Gait assistance: Supervision Gait Distance (Feet): 75 Feet Assistive device: None Gait Pattern/deviations: Step-through pattern;Decreased stride length     General Gait Details: slow gait speed. No LOB. O2 sat 90% on 2L. Dyspnea 2/4  Stairs            Wheelchair Mobility    Modified Rankin (Stroke Patients Only)       Balance Overall balance assessment: Mild deficits observed, not formally tested                                           Pertinent Vitals/Pain Pain Assessment: No/denies pain    Home Living Family/patient expects to be discharged to:: Private residence                      Prior Function Level of Independence: Independent               Hand Dominance        Extremity/Trunk Assessment   Upper Extremity Assessment Upper Extremity Assessment: Defer to OT evaluation    Lower Extremity Assessment Lower Extremity Assessment: Overall WFL for tasks  assessed    Cervical / Trunk Assessment Cervical / Trunk Assessment: Normal  Communication   Communication: No difficulties  Cognition Arousal/Alertness: Awake/alert Behavior During Therapy: WFL for tasks assessed/performed Overall Cognitive Status: Within Functional Limits for tasks assessed                                        General Comments      Exercises     Assessment/Plan    PT Assessment Patient needs continued PT services  PT Problem List Decreased mobility;Decreased activity tolerance       PT Treatment Interventions Gait training;Functional mobility training;Therapeutic activities;Therapeutic exercise;Patient/family education    PT Goals (Current goals can be found in the Care Plan section)  Acute Rehab PT Goals Patient Stated Goal: home. breathe better. PT Goal Formulation: With patient Time For Goal Achievement: 09/30/18 Potential to Achieve Goals: Good    Frequency Min 3X/week   Barriers to discharge        Co-evaluation               AM-PAC PT "6 Clicks"  Mobility  Outcome Measure Help needed turning from your back to your side while in a flat bed without using bedrails?: None Help needed moving from lying on your back to sitting on the side of a flat bed without using bedrails?: None Help needed moving to and from a bed to a chair (including a wheelchair)?: None Help needed standing up from a chair using your arms (e.g., wheelchair or bedside chair)?: None Help needed to walk in hospital room?: A Little Help needed climbing 3-5 steps with a railing? : A Little 6 Click Score: 22    End of Session Equipment Utilized During Treatment: Oxygen Activity Tolerance: Patient limited by fatigue Patient left: in bed;with call bell/phone within reach   PT Visit Diagnosis: Difficulty in walking, not elsewhere classified (R26.2)    Time: 1100-1115 PT Time Calculation (min) (ACUTE ONLY): 15 min   Charges:   PT Evaluation $PT  Eval Moderate Complexity: [place], PT Acute Rehabilitation Services Pager: [phone] Office: [phone]

## 2018-09-16 NOTE — Progress Notes (Addendum)
Initial Nutrition Assessment  DOCUMENTATION CODES:   Underweight, Severe malnutrition in context of chronic illness  INTERVENTION:    Ensure Enlive po TID, each supplement provides 350 kcal and 20 grams of protein  Provide MVI daily  NUTRITION DIAGNOSIS:   Severe Malnutrition related to chronic illness(COPD) as evidenced by energy intake < or equal to 75% for > or equal to 1 month, severe muscle depletion, moderate fat depletion.  GOAL:   Patient will meet greater than or equal to 90% of their needs  MONITOR:   PO intake, Labs, Weight trends, Supplement acceptance  REASON FOR ASSESSMENT:   Consult Assessment of nutrition requirement/status  ASSESSMENT:   Patient with PMH significant for severe COPD, TIA, HTN, hypothyroidism, luminary nodules pleural pleural effusion, and tobacco abuse. Presents this admission with COPD with acute exacerbation.    Pt endorses having a loss of appetite for 2-3 weeks PTA due to ongoing difficulty breathing. States during this time she would eat one meal daily that consisted of sausage biscuits or hamburger steak with baked potato. Reports that she has a hard time standing and cooking because she gets out of breath easily. Her sister did most of the cooking PTA. The pt didn't trust the cleanliness of the food her sister prepared, so she didn't eat most of it. Pt does not use supplementation at home but is willing to try Ensure this admission. Discussed the importance of protein intake for preservation of lean body mass. Encouraged pt to have liquid source of nutrition on hand for when appetite decreases.   Pt reports a UBW of 112 lb and a recently wt loss of 4 lb. Records indicate pt weighed 109 lb 10/7 and 102 lb this admission (6.4% wt loss in 4 months, insignificant for time frame). Nutrition-Focused physical exam completed.  Medications reviewed and include: IV abx Labs reviewed: CBG 96-101  NUTRITION - FOCUSED PHYSICAL EXAM:    Most Recent  Value  Orbital Region  Mild depletion  Upper Arm Region  Moderate depletion  Thoracic and Lumbar Region  Moderate depletion  Buccal Region  Moderate depletion  Temple Region  Moderate depletion  Clavicle Bone Region  Severe depletion  Clavicle and Acromion Bone Region  Severe depletion  Scapular Bone Region  Moderate depletion  Dorsal Hand  Severe depletion  Patellar Region  Severe depletion  Anterior Thigh Region  Severe depletion  Posterior Calf Region  Severe depletion  Edema (RD Assessment)  None  Hair  Reviewed  Eyes  Reviewed  Mouth  Reviewed  Skin  Reviewed  Nails  Reviewed     Diet Order:   Diet Order            Diet Heart Room service appropriate? Yes; Fluid consistency: Thin  Diet effective now              EDUCATION NEEDS:   Education needs have been addressed  Skin:  Skin Assessment: Reviewed RN Assessment  Last BM:  PTA  Height:   Ht Readings from Last 1 Encounters:  09/15/18 5\' 4"  (1.626 m)    Weight:   Wt Readings from Last 1 Encounters:  09/15/18 46.3 kg    Ideal Body Weight:  54.5 kg  BMI:  Body mass index is 17.51 kg/m.  Estimated Nutritional Needs:   Kcal:  1400-1600 kcal  Protein:  70-85 grams   Fluid:  >/= 1.4 L/day   Mariana Single RD, LDN Clinical Nutrition Pager # [phone]

## 2018-09-16 NOTE — Evaluation (Signed)
Occupational Therapy Evaluation Patient Details Name: Cheryl Haley MRN: [id number] DOB: July 18, 1956 Today's Date: 09/16/2018    History of Present Illness 63 yo female admitted with COPD exac. Hx of COPD   Clinical Impression   Pt was admitted for the above. At baseline, she is independent with adls and really didn't use 02 until Wednesday. She is independent driving and cooking. She would order groceries ahead of time and pick them up. She is currently at set up to supervision level for adls, and sats did drop on RA. Pt will benefit from continued OT to reinforce energy conservation strategies and she continues to build her cardiopulmonary endurance     Follow Up Recommendations  No OT follow up    Equipment Recommendations  None recommended by OT    Recommendations for Other Services       Precautions / Restrictions Precautions Precautions: None Precaution Comments: monitor O2. Currently O2 dep Restrictions Weight Bearing Restrictions: No      Mobility Bed Mobility Overal bed mobility: Independent                Transfers Overall transfer level: Modified independent                    Balance Overall balance assessment: Mild deficits observed, not formally tested                                         ADL either performed or assessed with clinical judgement   ADL Overall ADL's : Needs assistance/impaired                         Toilet Transfer: Supervision/safety;Ambulation;Comfort height toilet             General ADL Comments: pt is able to perform ADLs with set up. She requires rest breaks for dyspnea.  Reviewed energy conservation and took seated rest break     Vision         Perception     Praxis      Pertinent Vitals/Pain Pain Assessment: No/denies pain     Hand Dominance     Extremity/Trunk Assessment Upper Extremity Assessment Upper Extremity Assessment: Overall WFL for tasks assessed    Lower Extremity Assessment Lower Extremity Assessment: Overall WFL for tasks assessed   Cervical / Trunk Assessment Cervical / Trunk Assessment: Normal   Communication Communication Communication: No difficulties   Cognition Arousal/Alertness: Awake/alert Behavior During Therapy: WFL for tasks assessed/performed Overall Cognitive Status: Within Functional Limits for tasks assessed                                     General Comments  pt dropped to 87% on RA walking to bathroom with 2/4 dyspnea.  Returned to 2 liters and 02 back up to 92%    Exercises     Shoulder Instructions      Home Living Family/patient expects to be discharged to:: Private residence Living Arrangements: Other relatives                 Bathroom Shower/Tub: Teacher, early years/pre: Standard         Additional Comments: stands in tub/shower or gets to bottom      Prior Functioning/Environment Level of Independence: Independent  OT Problem List: Decreased activity tolerance;Cardiopulmonary status limiting activity      OT Treatment/Interventions: Self-care/ADL training;DME and/or AE instruction;Patient/family education    OT Goals(Current goals can be found in the care plan section) Acute Rehab OT Goals Patient Stated Goal: home. breathe better. OT Goal Formulation: With patient Time For Goal Achievement: 09/30/18 Potential to Achieve Goals: Good ADL Goals Pt Will Perform Tub/Shower Transfer: Tub transfer;with modified independence Additional ADL Goal #1: pt will gather clothes and complete ADL at mod I level, incorporating rest breaks as needed for energy conservation  OT Frequency: Min 2X/week   Barriers to D/C:            Co-evaluation              AM-PAC OT "6 Clicks" Daily Activity     Outcome Measure Help from another person eating meals?: None Help from another person taking care of personal grooming?: A Little Help  from another person toileting, which includes using toliet, bedpan, or urinal?: A Little Help from another person bathing (including washing, rinsing, drying)?: A Little Help from another person to put on and taking off regular upper body clothing?: A Little Help from another person to put on and taking off regular lower body clothing?: A Little 6 Click Score: 19   End of Session    Activity Tolerance: (required rest breaks) Patient left: (with PT)  OT Visit Diagnosis: Muscle weakness (generalized) (M62.81)                Time: 1047-1100 OT Time Calculation (min): 13 min Charges:  OT General Charges $OT Visit: 1 Visit OT Evaluation $OT Eval Low Complexity: [place], OTR/L Acute Rehabilitation Services [phone] [facility] pager [phone] office 09/16/2018  [place] 09/16/2018, 1:11 PM

## 2018-09-16 NOTE — Progress Notes (Signed)
PT demonstrated hands on understanding of Flutter device- NPC at this time. 

## 2018-09-16 NOTE — Progress Notes (Signed)
Pt ambulated in hall 360 ft with NT. Saturation levels in the upper 90's with a steady drop to 92% halfway and as low as 80% by the time pt returned to room. Tolerated walk w/minimal shortness of breath. No distress noted. O2 applied at 2L.

## 2018-09-16 NOTE — Progress Notes (Signed)
[facility] Progress Note  Subjective: pt feeling better, breathing better , no sig chills or fevers.   Vitals:   09/16/18 0906 09/16/18 0938 09/16/18 1347 09/16/18 1549  BP:  (!) 114/56 102/60   Pulse:  92 86   Resp:  16 17   Temp:  (!) 97.5 F (36.4 C) 98 F (36.7 C)   TempSrc:  Oral Oral   SpO2: 97% 94% 90% 94%  Weight:      Height:        Inpatient medications: . aspirin EC  81 mg Oral Daily  . bisoprolol  5 mg Oral Daily  . diltiazem  135 mg Oral Q12H  . enoxaparin (LOVENOX) injection  40 mg Subcutaneous QHS  . feeding supplement (ENSURE ENLIVE)  237 mL Oral TID BM  . guaiFENesin  600 mg Oral BID  . ipratropium-albuterol  3 mL Nebulization Q6H  . levothyroxine  100 mcg Oral Q0600  . mometasone-formoterol  1 puff Inhalation BID  . multivitamin with minerals  1 tablet Oral Daily  . [START ON 09/17/2018] predniSONE  40 mg Oral Q breakfast  . rosuvastatin  5 mg Oral Daily   . cefTRIAXone (ROCEPHIN)  IV Stopped (09/16/18 0203)   acetaminophen **OR** acetaminophen, HYDROcodone-acetaminophen, levalbuterol, ondansetron **OR** ondansetron (ZOFRAN) IV  Exam: 1. General:  in No Acute distress, calm and no ^wob   Chronically ill  -appearing 2. Psychological: Alert and   Oriented 3. Head/ENT:   Moist  Mucous Membranes poor dentition 4. SKIN: decreased Skin turgor,  Skin clean Dry and intact no rash 5. Heart: Regular rate and rhythm no  Murmur, no Rub or gallop 6. Lungs: poor air movement in general, no sig wheezing or rales 7. Abdomen: Soft non-tender, Non distended  bowel sounds present 8. Lower extremities: no clubbing, cyanosis, or  edema 9. Neurologically Grossly intact, moving all 4 extremities equally   10. MSK: Normal range of motion   Presentation Summary: Cheryl Haley is a 63 y.o. female with medical history significant of severe COPD, history of TIA, HTN, hypothyroidism, luminary nodules pleural pleural effusion tobacco abuse who presented with  severe  shortness of breath progressive over the past 2 weeks seen her pulmonologist [name] for the last week and was started on prednisone taper for the past 5 days and antibiotics.  Doxycycline but continued to be shortness of breath and progressively gotten worse she states she cannot even walk without getting severe shortness of breath she usually on oxygen as needed but now has been using this continuously.  Increased sputum production no peripheral edema no syncope but she does feel somewhat lightheaded when she tries to walk. Reports clear sputum. Her brother had bronchitis recently. She had her flu shot and PNA shot Denies any fever, no chest pain. She quit smoking 1 year ago.States this is the sickest she has been.  Right now she is using Symbicort alone supposed to start LAMA could not afford Trelegy. On scheduled DuoNeb Regarding pertinent Chronic problems: Of hypertension on metoprolol and Cardizem history of pulmonary nodule seen a CT scan in a past plan to reimage in February ER Course:  Requiring up to 4 L of oxygen respirations up to 23 Given albuterol and nebulizer treatments in the emergency department without significant improvement.        Hospital Course:  COPD with acute exacerbation ([place]) -failure of outpatient management: seems to be improving, breathing easier. No sig fever, WBC wnl.  RVP showing + for metapneumovirus.  -  will dc IV abx w/ +viral panel culprit   - Steroid taper - Albuterol   XopenexPRN, - scheduled duoneb,  -  Breo or Dulera at discharge   -  Mucinex.  - Titrate O2 to saturation >90% - Currently mentating well no evidence of symptomatic hypercarbia  HTN (hypertension) stable continue home medications hold beta-blockers as this can worsen bronchial spasm will change to Zebeta  Tobacco abuse currently in remission  Chronic respiratory failure with hypoxia (HCC) acute on chronic exacerbation now requiring oxygen around-the-clock prior to discharge will have  physical therapy ambulate to see if patient needs now oxygen with ambulation.  Prior to discharge  Pulmonary nodules follow-up as an outpatient with pulmonology    DVT prophylaxis: Lovenox Code Status: Limited code   BiPAP is ok, Yes On shock, yes on  Pressors    as per patient  DO NOT Intubate, no Chest compressions Admitting MD personally discussed CODE STATUS with patient   Family Communication:  no family here today Disposition Plan: To home once workup is complete and patient is stable Would benefit from PT/OT eval prior to DC  Ordered Consults called: Pulmonology is aware Admission status:  inpatient       Kelly Splinter MD [facility] pgr [phone] 09/16/2018, 5:22 PM   Recent Labs  Lab 09/15/18 1705 09/16/18 0507  NA 137 137  K 4.3 4.4  CL 95* 96*  CO2 31 31  GLUCOSE 96 101*  BUN 17 16  CREATININE 0.68 0.64  CALCIUM 9.6 9.0  PHOS  --  3.3   Recent Labs  Lab 09/16/18 0507  AST 18  ALT 15  ALKPHOS 48  BILITOT 0.6  PROT 7.0  ALBUMIN 3.9   Recent Labs  Lab 09/15/18 1705 09/16/18 0507  WBC 9.1 6.9  NEUTROABS 5.4  --   HGB 14.6 13.0  HCT 45.9 41.1  MCV 96.0 94.7  PLT 515* 430*   Iron/TIBC/Ferritin/ %Sat No results found for: IRON, TIBC, FERRITIN, IRONPCTSAT

## 2018-09-17 DIAGNOSIS — J441 Chronic obstructive pulmonary disease with (acute) exacerbation: Principal | ICD-10-CM

## 2018-09-17 DIAGNOSIS — E43 Unspecified severe protein-calorie malnutrition: Secondary | ICD-10-CM

## 2018-09-17 HISTORY — DX: Unspecified severe protein-calorie malnutrition: E43

## 2018-09-17 LAB — CBC
HCT: 38.5 % (ref 36.0–46.0)
Hemoglobin: 12.3 g/dL (ref 12.0–15.0)
MCH: 30.7 pg (ref 26.0–34.0)
MCHC: 31.9 g/dL (ref 30.0–36.0)
MCV: 96 fL (ref 80.0–100.0)
Platelets: 400 10*3/uL (ref 150–400)
RBC: 4.01 MIL/uL (ref 3.87–5.11)
RDW: 14.5 % (ref 11.5–15.5)
WBC: 9.9 10*3/uL (ref 4.0–10.5)
nRBC: 0 % (ref 0.0–0.2)

## 2018-09-17 LAB — BASIC METABOLIC PANEL
Anion gap: 7 (ref 5–15)
BUN: 20 mg/dL (ref 8–23)
CO2: 30 mmol/L (ref 22–32)
Calcium: 9.1 mg/dL (ref 8.9–10.3)
Chloride: 102 mmol/L (ref 98–111)
Creatinine, Ser: 0.57 mg/dL (ref 0.44–1.00)
GFR calc Af Amer: >60 mL/min (ref >60)
Glucose, Bld: 140 mg/dL — ABNORMAL HIGH (ref 70–99)
Potassium: 3.9 mmol/L (ref 3.5–5.1)
SODIUM: 139 mmol/L (ref 135–145)

## 2018-09-17 MED ORDER — BUDESONIDE 0.5 MG/2ML IN SUSP
0.5000 mg | Freq: Two times a day (BID) | RESPIRATORY_TRACT | Status: DC
  Administered 2018-09-17 – 2018-09-19 (×4): 0.5 mg via RESPIRATORY_TRACT
  Filled 2018-09-17 (×4): qty 2

## 2018-09-17 MED ORDER — IPRATROPIUM-ALBUTEROL 0.5-2.5 (3) MG/3ML IN SOLN
3.0000 mL | Freq: Four times a day (QID) | RESPIRATORY_TRACT | Status: DC
  Administered 2018-09-17 – 2018-09-18 (×5): 3 mL via RESPIRATORY_TRACT
  Filled 2018-09-17 (×5): qty 3

## 2018-09-17 MED ORDER — METHYLPREDNISOLONE SODIUM SUCC 40 MG IJ SOLR
40.0000 mg | Freq: Four times a day (QID) | INTRAMUSCULAR | Status: DC
  Administered 2018-09-17 – 2018-09-19 (×7): 40 mg via INTRAVENOUS
  Filled 2018-09-17 (×7): qty 1

## 2018-09-17 MED ORDER — BISOPROLOL FUMARATE 5 MG PO TABS
2.5000 mg | ORAL_TABLET | Freq: Every day | ORAL | Status: DC
  Administered 2018-09-18 – 2018-09-19 (×2): 2.5 mg via ORAL
  Filled 2018-09-17 (×2): qty 1

## 2018-09-17 MED ORDER — IPRATROPIUM-ALBUTEROL 0.5-2.5 (3) MG/3ML IN SOLN
3.0000 mL | Freq: Three times a day (TID) | RESPIRATORY_TRACT | Status: DC
  Administered 2018-09-17 (×2): 3 mL via RESPIRATORY_TRACT
  Filled 2018-09-17 (×2): qty 3

## 2018-09-17 MED ORDER — CEFDINIR 300 MG PO CAPS
300.0000 mg | ORAL_CAPSULE | Freq: Two times a day (BID) | ORAL | Status: DC
  Administered 2018-09-17 – 2018-09-19 (×4): 300 mg via ORAL
  Filled 2018-09-17 (×4): qty 1

## 2018-09-17 MED ORDER — DILTIAZEM HCL 60 MG PO TABS
60.0000 mg | ORAL_TABLET | Freq: Three times a day (TID) | ORAL | Status: DC
  Administered 2018-09-18: 60 mg via ORAL
  Filled 2018-09-17 (×2): qty 1

## 2018-09-17 NOTE — Care Management (Signed)
This CM spoke with pt about dc plan. Pt declines any home health services at this time. She is already set up for home 02. Marney Doctor RN,BSN [phone]

## 2018-09-17 NOTE — Progress Notes (Signed)
PROGRESS NOTE    Cheryl Haley  FBP:[id number] DOB: 1956/07/16 DOA: 09/15/2018 PCP: [name], MD  Outpatient Specialists:   Brief Narrative: Cheryl Haley is a 63 y.o. female with medical history significant of severe COPD, history of TIA, HTN, hypothyroidism, luminary nodules pleural pleural effusion tobacco abuse.  Patient was admitted with COPD with exacerbation after failing outpatient treatment.   Assessment & Plan:   Principal Problem:   COPD with acute exacerbation ([place]) Active Problems:   COPD (chronic obstructive pulmonary disease) with emphysema (HCC)   HTN (hypertension)   Tobacco abuse   Chronic respiratory failure with hypoxia (HCC)   Pulmonary nodules   Viral respiratory infection   Protein-calorie malnutrition, severe   COPD with acute exacerbation [facility]): Patient failed outpatient management.   Continue IV Solu-Medrol  Add nebs Pulmicort  Increase DuoNeb to every 6 hourly  Continue pulmonary toiletry  Further management depend on hospital course.    Hypertension:  Blood pressure is currently on the low normal side.   Decrease bisoprolol from 5 Mg p.o. once daily to 2.5 mg p.o. once daily.   Change Cardizem from 135 mg p.o. twice daily to 60 mg p.o. every 8 hourly Continue to monitor blood pressure closely.  Tobacco abusecurrently in remission  Acute on chronic chronic respiratory failure with hypoxia [facility]): Likely secondary to COPD with exacerbation.  Manage underlying problem.   Pulmonary nodulesfollow-up as an outpatient with pulmonology  DVT prophylaxis:Lovenox Code Status:Limited code BiPAP is ok, Yes On shock, yes on Pressors as per patientDO NOT Intubate, no Chest compressions Admitting MD personally discussed CODE STATUS with patient  Family Communication: Disposition Plan:Will depend on hospital course.   Consults called:Pulmonology is aware  Procedures:   None  Antimicrobials:   Start Omnicef 300 mg  p.o. twice daily   Subjective: No new complaints  Objective: Vitals:   09/17/18 0628 09/17/18 0737 09/17/18 1114 09/17/18 1336  BP: 129/72   (!) 86/59  Pulse: 74   66  Resp: 20     Temp: 97.9 F (36.6 C)   98.5 F (36.9 C)  TempSrc: Oral   Oral  SpO2: 98% 96% 90% 98%  Weight:      Height:        Intake/Output Summary (Last 24 hours) at 09/17/2018 1709 Last data filed at 09/17/2018 1400 Gross per 24 hour  Intake 360 ml  Output 550 ml  Net -190 ml   Filed Weights   09/15/18 1149  Weight: 46.3 kg    Examination:  General exam: Appears calm and comfortable  Respiratory system: Very decreased air entry globally.   Cardiovascular system: S1 & S2 heard,. Gastrointestinal system: Abdomen is nondistended, soft and nontender. No organomegaly or masses felt. Normal bowel sounds heard. Central nervous system: Alert and oriented. No focal neurological deficits. Extremities: No leg edema.  Data Reviewed: I have personally reviewed following labs and imaging studies  CBC: Recent Labs  Lab 09/15/18 1705 09/16/18 0507 09/17/18 0433  WBC 9.1 6.9 9.9  NEUTROABS 5.4  --   --   HGB 14.6 13.0 12.3  HCT 45.9 41.1 38.5  MCV 96.0 94.7 96.0  PLT 515* 430* 824   Basic Metabolic Panel: Recent Labs  Lab 09/15/18 1705 09/16/18 0507 09/17/18 0433  NA 137 137 139  K 4.3 4.4 3.9  CL 95* 96* 102  CO2 31 31 30   GLUCOSE 96 101* 140*  BUN 17 16 20   CREATININE 0.68 0.64 0.57  CALCIUM 9.6 9.0 9.1  MG  --  2.0  --   PHOS  --  3.3  --    GFR: Estimated Creatinine Clearance: 51.9 mL/min (by C-G formula based on SCr of 0.57 mg/dL). Liver Function Tests: Recent Labs  Lab 09/16/18 0507  AST 18  ALT 15  ALKPHOS 48  BILITOT 0.6  PROT 7.0  ALBUMIN 3.9   No results for input(s): LIPASE, AMYLASE in the last 168 hours. No results for input(s): AMMONIA in the last 168 hours. Coagulation Profile: No results for input(s): INR, PROTIME in the last 168 hours. Cardiac Enzymes: No  results for input(s): CKTOTAL, CKMB, CKMBINDEX, TROPONINI in the last 168 hours. BNP (last 3 results) No results for input(s): PROBNP in the last 8760 hours. HbA1C: No results for input(s): HGBA1C in the last 72 hours. CBG: No results for input(s): GLUCAP in the last 168 hours. Lipid Profile: No results for input(s): CHOL, HDL, LDLCALC, TRIG, CHOLHDL, LDLDIRECT in the last 72 hours. Thyroid Function Tests: Recent Labs    09/16/18 0507  TSH 1.547   Anemia Panel: No results for input(s): VITAMINB12, FOLATE, FERRITIN, TIBC, IRON, RETICCTPCT in the last 72 hours. Urine analysis:    Component Value Date/Time   COLORURINE AMBER (A) 09/20/2013 1621   APPEARANCEUR CLOUDY (A) 09/20/2013 1621   LABSPEC 1.031 (H) 09/20/2013 1621   PHURINE 5.5 09/20/2013 1621   GLUCOSEU NEGATIVE 09/20/2013 1621   HGBUR NEGATIVE 09/20/2013 1621   BILIRUBINUR MODERATE (A) 09/20/2013 1621   KETONESUR 40 (A) 09/20/2013 1621   PROTEINUR 30 (A) 09/20/2013 1621   UROBILINOGEN 1.0 09/20/2013 1621   NITRITE NEGATIVE 09/20/2013 1621   LEUKOCYTESUR SMALL (A) 09/20/2013 1621   Sepsis Labs: @LABRCNTIP (procalcitonin:4,lacticidven:4)  ) Recent Results (from the past 240 hour(s))  Respiratory Panel by PCR     Status: Abnormal   Collection Time: 09/16/18  3:14 AM  Result Value Ref Range Status   Adenovirus NOT DETECTED NOT DETECTED Final   Coronavirus 229E NOT DETECTED NOT DETECTED Final   Coronavirus HKU1 NOT DETECTED NOT DETECTED Final   Coronavirus NL63 NOT DETECTED NOT DETECTED Final   Coronavirus OC43 NOT DETECTED NOT DETECTED Final   Metapneumovirus DETECTED (A) NOT DETECTED Final   Rhinovirus / Enterovirus NOT DETECTED NOT DETECTED Final   Influenza A NOT DETECTED NOT DETECTED Final   Influenza B NOT DETECTED NOT DETECTED Final   Parainfluenza Virus 1 NOT DETECTED NOT DETECTED Final   Parainfluenza Virus 2 NOT DETECTED NOT DETECTED Final   Parainfluenza Virus 3 NOT DETECTED NOT DETECTED Final    Parainfluenza Virus 4 NOT DETECTED NOT DETECTED Final   Respiratory Syncytial Virus NOT DETECTED NOT DETECTED Final   Bordetella pertussis NOT DETECTED NOT DETECTED Final   Chlamydophila pneumoniae NOT DETECTED NOT DETECTED Final   Mycoplasma pneumoniae NOT DETECTED NOT DETECTED Final    Comment: Performed at [facility], [place] [address] [place], [place] [address]         Radiology Studies: No results found.      Scheduled Meds: . aspirin EC  81 mg Oral Daily  . bisoprolol  5 mg Oral Daily  . diltiazem  135 mg Oral Q12H  . enoxaparin (LOVENOX) injection  40 mg Subcutaneous QHS  . feeding supplement (ENSURE ENLIVE)  237 mL Oral TID BM  . guaiFENesin  600 mg Oral BID  . ipratropium-albuterol  3 mL Nebulization TID  . levothyroxine  100 mcg Oral Q0600  . mometasone-formoterol  1 puff Inhalation BID  . multivitamin with minerals  1  tablet Oral Daily  . predniSONE  40 mg Oral Q breakfast  . rosuvastatin  5 mg Oral Daily   Continuous Infusions:   LOS: 2 days    Time spent: 35 minutes  Dana Allan, MD  [facility] Pager #: [phone] 7PM-7AM contact night coverage as above

## 2018-09-17 NOTE — Progress Notes (Signed)
PT continues to demonstrate hands on understanding of Flutter device- NPC at this time. 

## 2018-09-17 NOTE — Progress Notes (Signed)
Physical Therapy Treatment Patient Details Name: Cheryl Haley MRN: [id number] DOB: 1955/09/20 Today's Date: 09/17/2018    History of Present Illness 63 yo female admitted with COPD exac. Hx of COPD    PT Comments    Pt ambulated 14' on room air, SaO2 dropped to 88%. She then ambulated 400' with 2L O2, SaO2 90%, 3/4 dyspnea. No loss of balance. Pt tolerated increased distance today.   Follow Up Recommendations  No PT follow up     Equipment Recommendations  None recommended by PT    Recommendations for Other Services       Precautions / Restrictions Precautions Precautions: Other (comment) Precaution Comments: monitor O2. Currently O2 dep Restrictions Weight Bearing Restrictions: No    Mobility  Bed Mobility Overal bed mobility: Independent                Transfers Overall transfer level: Independent                  Ambulation/Gait Ambulation/Gait assistance: Supervision Gait Distance (Feet): 400 Feet Assistive device: None Gait Pattern/deviations: Step-through pattern;Decreased stride length     General Gait Details:  No LOB. SaO2 88% on room air after ambulating 14', so applied 2L O2 and SaO2 came up to 90%. Dyspnea 3/4 by end of walk.    Stairs             Wheelchair Mobility    Modified Rankin (Stroke Patients Only)       Balance                                            Cognition Arousal/Alertness: Awake/alert Behavior During Therapy: WFL for tasks assessed/performed Overall Cognitive Status: Within Functional Limits for tasks assessed                                        Exercises      General Comments        Pertinent Vitals/Pain Pain Assessment: No/denies pain    Home Living                      Prior Function            PT Goals (current goals can now be found in the care plan section) Acute Rehab PT Goals Patient Stated Goal: home. breathe better. PT  Goal Formulation: With patient Time For Goal Achievement: 09/30/18 Potential to Achieve Goals: Good Progress towards PT goals: Progressing toward goals    Frequency    Min 3X/week      PT Plan Current plan remains appropriate    Co-evaluation              AM-PAC PT "6 Clicks" Mobility   Outcome Measure  Help needed turning from your back to your side while in a flat bed without using bedrails?: None Help needed moving from lying on your back to sitting on the side of a flat bed without using bedrails?: None Help needed moving to and from a bed to a chair (including a wheelchair)?: None Help needed standing up from a chair using your arms (e.g., wheelchair or bedside chair)?: None Help needed to walk in hospital room?: A Little Help needed climbing 3-5 steps with a railing? : A Little  6 Click Score: 22    End of Session Equipment Utilized During Treatment: Oxygen Activity Tolerance: Patient limited by fatigue Patient left: in bed;with call bell/phone within reach Nurse Communication: Mobility status PT Visit Diagnosis: Difficulty in walking, not elsewhere classified (R26.2)     Time: 1055-1110 PT Time Calculation (min) (ACUTE ONLY): 15 min  Charges:  $Gait Training: 8-22 mins                    Blondell Reveal Kistler PT 09/17/2018  Acute Rehabilitation Services Pager [phone] Office [phone]

## 2018-09-17 NOTE — Progress Notes (Signed)
Occupational Therapy Treatment Patient Details Name: Cheryl Haley MRN: [id number] DOB: 1956/08/11 Today's Date: 09/17/2018    History of present illness 63 yo female admitted with COPD exac. Hx of COPD   OT comments  Practiced tub transfer: pt would benefit from seat  Follow Up Recommendations  No OT follow up    Equipment Recommendations  None recommended by OT:  Would benefit from tub seat; CM states that medicaid will not cover this     Recommendations for Other Services      Precautions / Restrictions Precautions Precautions: Other (comment) Precaution Comments: monitor O2. Currently O2 dep Restrictions Weight Bearing Restrictions: No       Mobility Bed Mobility Overal bed mobility: Independent                Transfers Overall transfer level: Independent                    Balance                                           ADL either performed or assessed with clinical judgement   ADL Overall ADL's : Needs assistance/impaired                                 Tub/ Shower Transfer: Tub transfer;Supervision/safety;Ambulation;Shower seat     General ADL Comments: Pt would benefit from shower seat:  talked to CM and medicaid no longer covers these     Vision       Perception     Praxis      Cognition Arousal/Alertness: Awake/alert Behavior During Therapy: WFL for tasks assessed/performed Overall Cognitive Status: Within Functional Limits for tasks assessed                                          Exercises     Shoulder Instructions       General Comments      Pertinent Vitals/ Pain       Pain Assessment: No/denies pain  Home Living                                          Prior Functioning/Environment              Frequency           Progress Toward Goals  OT Goals(current goals can now be found in the care plan section)  Progress towards OT  goals: Progressing toward goals     Plan      Co-evaluation                 AM-PAC OT "6 Clicks" Daily Activity     Outcome Measure   Help from another person eating meals?: None Help from another person taking care of personal grooming?: A Little Help from another person toileting, which includes using toliet, bedpan, or urinal?: A Little Help from another person bathing (including washing, rinsing, drying)?: A Little Help from another person to put on and taking off regular upper body clothing?: A Little Help from another person to put on and  taking off regular lower body clothing?: A Little 6 Click Score: 19    End of Session        Activity Tolerance Patient tolerated treatment well   Patient Left in bed;with call bell/phone within reach   Nurse Communication          Time: 1448-1500 OT Time Calculation (min): 12 min  Charges: OT General Charges $OT Visit: 1 Visit OT Treatments $Self Care/Home Management : 8-22 mins  Lesle Chris, OTR/L Acute Rehabilitation Services [phone] [place] pager [phone] office 09/17/2018   [place] 09/17/2018, 3:47 PM

## 2018-09-17 NOTE — Plan of Care (Signed)
Patient in bed this morning. States she does not feel well today; she felt better yesterday. No complaints of pain noted at this time. Will continue to monitor.

## 2018-09-18 MED ORDER — DILTIAZEM HCL 30 MG PO TABS
30.0000 mg | ORAL_TABLET | Freq: Four times a day (QID) | ORAL | Status: DC
  Administered 2018-09-18 – 2018-09-19 (×5): 30 mg via ORAL
  Filled 2018-09-18 (×6): qty 1

## 2018-09-18 MED ORDER — IPRATROPIUM-ALBUTEROL 0.5-2.5 (3) MG/3ML IN SOLN
3.0000 mL | Freq: Three times a day (TID) | RESPIRATORY_TRACT | Status: DC
  Administered 2018-09-19: 3 mL via RESPIRATORY_TRACT

## 2018-09-18 NOTE — Progress Notes (Signed)
I have reviewed and concur with this student's documentation.   

## 2018-09-18 NOTE — Plan of Care (Signed)
Patient resting in bed this morning. No complaints of pain voiced at this time. States she had a good night and is feeling better this morning. Will continue to monitor.

## 2018-09-18 NOTE — Progress Notes (Signed)
PROGRESS NOTE    Cheryl Haley  ZOX:[id number] DOB: 1956/02/02 DOA: 09/15/2018 PCP: [name], MD  Outpatient Specialists:   Brief Narrative: Cheryl Haley is a 63 y.o. female with medical history significant of severe COPD, history of TIA, HTN, hypothyroidism, luminary nodules pleural pleural effusion tobacco abuse.  Patient was admitted with COPD with exacerbation after failing outpatient treatment.   09/18/2018: Patient seen.  Patient is slowly improving, but not yet back to baseline.  Lung exam still reveals very decreased air entry.  Will start flutter valve device therapy.  Will continue other management.  Hopefully, patient will be discharged back home in the next 1 to 2 days.   Assessment & Plan:   Principal Problem:   COPD with acute exacerbation ([place]) Active Problems:   COPD (chronic obstructive pulmonary disease) with emphysema (HCC)   HTN (hypertension)   Tobacco abuse   Chronic respiratory failure with hypoxia (H[facility])   Pulmonary nodules   Viral respiratory infection   Protein-calorie malnutrition, severe   COPD with acute exacerbation [facility]): Patient failed outpatient management.   Continue IV Solu-Medrol  Continue nebs Pulmicort  Continue DuoNeb to every 6 hours. Continue pulmonary toiletry, and add flutter valve device therapy. Further management depend on hospital course.    Hypertension:  Blood pressure is currently on the low normal side.   Decrease bisoprolol from 5 Mg p.o. once daily to 2.5 mg p.o. once daily.   Change Cardizem from 135 mg p.o. twice daily to 60 mg p.o. every 8 hourly Continue to monitor blood pressure closely. 09/18/2018: Decrease Cardizem to 30 mg p.o. every 6 hourly.  Continue to monitor heart rate closely.  Tobacco abusecurrently in remission  Acute on chronic chronic respiratory failure with hypoxia [facility]): Likely secondary to COPD with exacerbation.  Manage underlying problem.   Pulmonary nodulesfollow-up as an  outpatient with pulmonology  DVT prophylaxis:Lovenox Code Status:Limited code BiPAP is ok, Yes On shock, yes on Pressors as per patientDO NOT Intubate, no Chest compressions Admitting MD personally discussed CODE STATUS with patient  Family Communication: Disposition Plan:Will depend on hospital course.   Consults called:Pulmonology is aware  Procedures:   None  Antimicrobials:   Start Omnicef 300 mg p.o. twice daily   Subjective: No new complaints  Objective: Vitals:   09/18/18 0242 09/18/18 0537 09/18/18 0738 09/18/18 0845  BP:  121/71  (!) 99/57  Pulse:  79  84  Resp:  (!) 24  20  Temp:  98.2 F (36.8 C)  98.2 F (36.8 C)  TempSrc:  Oral  Oral  SpO2: 97% 96% 97% 99%  Weight:      Height:        Intake/Output Summary (Last 24 hours) at 09/18/2018 1156 Last data filed at 09/18/2018 0549 Gross per 24 hour  Intake 1260 ml  Output 700 ml  Net 560 ml   Filed Weights   09/15/18 1149  Weight: 46.3 kg    Examination:  General exam: Appears calm and comfortable  Respiratory system: Very decreased air entry globally.   Cardiovascular system: S1 & S2 heard,. Gastrointestinal system: Abdomen is nondistended, soft and nontender. No organomegaly or masses felt. Normal bowel sounds heard. Central nervous system: Alert and oriented. No focal neurological deficits. Extremities: No leg edema.  Data Reviewed: I have personally reviewed following labs and imaging studies  CBC: Recent Labs  Lab 09/15/18 1705 09/16/18 0507 09/17/18 0433  WBC 9.1 6.9 9.9  NEUTROABS 5.4  --   --   HGB 14.6  13.0 12.3  HCT 45.9 41.1 38.5  MCV 96.0 94.7 96.0  PLT 515* 430* 034   Basic Metabolic Panel: Recent Labs  Lab 09/15/18 1705 09/16/18 0507 09/17/18 0433  NA 137 137 139  K 4.3 4.4 3.9  CL 95* 96* 102  CO2 31 31 30   GLUCOSE 96 101* 140*  BUN 17 16 20   CREATININE 0.68 0.64 0.57  CALCIUM 9.6 9.0 9.1  MG  --  2.0  --   PHOS  --  3.3  --     GFR: Estimated Creatinine Clearance: 51.9 mL/min (by C-G formula based on SCr of 0.57 mg/dL). Liver Function Tests: Recent Labs  Lab 09/16/18 0507  AST 18  ALT 15  ALKPHOS 48  BILITOT 0.6  PROT 7.0  ALBUMIN 3.9   No results for input(s): LIPASE, AMYLASE in the last 168 hours. No results for input(s): AMMONIA in the last 168 hours. Coagulation Profile: No results for input(s): INR, PROTIME in the last 168 hours. Cardiac Enzymes: No results for input(s): CKTOTAL, CKMB, CKMBINDEX, TROPONINI in the last 168 hours. BNP (last 3 results) No results for input(s): PROBNP in the last 8760 hours. HbA1C: No results for input(s): HGBA1C in the last 72 hours. CBG: No results for input(s): GLUCAP in the last 168 hours. Lipid Profile: No results for input(s): CHOL, HDL, LDLCALC, TRIG, CHOLHDL, LDLDIRECT in the last 72 hours. Thyroid Function Tests: Recent Labs    09/16/18 0507  TSH 1.547   Anemia Panel: No results for input(s): VITAMINB12, FOLATE, FERRITIN, TIBC, IRON, RETICCTPCT in the last 72 hours. Urine analysis:    Component Value Date/Time   COLORURINE AMBER (A) 09/20/2013 1621   APPEARANCEUR CLOUDY (A) 09/20/2013 1621   LABSPEC 1.031 (H) 09/20/2013 1621   PHURINE 5.5 09/20/2013 1621   GLUCOSEU NEGATIVE 09/20/2013 1621   HGBUR NEGATIVE 09/20/2013 1621   BILIRUBINUR MODERATE (A) 09/20/2013 1621   KETONESUR 40 (A) 09/20/2013 1621   PROTEINUR 30 (A) 09/20/2013 1621   UROBILINOGEN 1.0 09/20/2013 1621   NITRITE NEGATIVE 09/20/2013 1621   LEUKOCYTESUR SMALL (A) 09/20/2013 1621   Sepsis Labs: @LABRCNTIP (procalcitonin:4,lacticidven:4)  ) Recent Results (from the past 240 hour(s))  Respiratory Panel by PCR     Status: Abnormal   Collection Time: 09/16/18  3:14 AM  Result Value Ref Range Status   Adenovirus NOT DETECTED NOT DETECTED Final   Coronavirus 229E NOT DETECTED NOT DETECTED Final   Coronavirus HKU1 NOT DETECTED NOT DETECTED Final   Coronavirus NL63 NOT DETECTED  NOT DETECTED Final   Coronavirus OC43 NOT DETECTED NOT DETECTED Final   Metapneumovirus DETECTED (A) NOT DETECTED Final   Rhinovirus / Enterovirus NOT DETECTED NOT DETECTED Final   Influenza A NOT DETECTED NOT DETECTED Final   Influenza B NOT DETECTED NOT DETECTED Final   Parainfluenza Virus 1 NOT DETECTED NOT DETECTED Final   Parainfluenza Virus 2 NOT DETECTED NOT DETECTED Final   Parainfluenza Virus 3 NOT DETECTED NOT DETECTED Final   Parainfluenza Virus 4 NOT DETECTED NOT DETECTED Final   Respiratory Syncytial Virus NOT DETECTED NOT DETECTED Final   Bordetella pertussis NOT DETECTED NOT DETECTED Final   Chlamydophila pneumoniae NOT DETECTED NOT DETECTED Final   Mycoplasma pneumoniae NOT DETECTED NOT DETECTED Final    Comment: Performed at [facility], [place] [address], [place], [place] [address]         Radiology Studies: No results found.      Scheduled Meds: . aspirin EC  81 mg Oral Daily  .  bisoprolol  2.5 mg Oral Daily  . budesonide (PULMICORT) nebulizer solution  0.5 mg Nebulization BID  . cefdinir  300 mg Oral Q12H  . diltiazem  30 mg Oral Q6H  . enoxaparin (LOVENOX) injection  40 mg Subcutaneous QHS  . feeding supplement (ENSURE ENLIVE)  237 mL Oral TID BM  . guaiFENesin  600 mg Oral BID  . ipratropium-albuterol  3 mL Nebulization Q6H  . levothyroxine  100 mcg Oral Q0600  . methylPREDNISolone (SOLU-MEDROL) injection  40 mg Intravenous Q6H  . multivitamin with minerals  1 tablet Oral Daily  . rosuvastatin  5 mg Oral Daily   Continuous Infusions:   LOS: 3 days    Time spent: 25 minutes  Dana Allan, MD  [facility] Pager #: [phone] 7PM-7AM contact night coverage as above

## 2018-09-19 MED ORDER — ENSURE ENLIVE PO LIQD: 237.0000 mL | Freq: Three times a day (TID) | ORAL | 12 refills | Status: DC

## 2018-09-19 MED ORDER — DILTIAZEM HCL 60 MG PO TABS: 60.0000 mg | ORAL_TABLET | Freq: Two times a day (BID) | ORAL | 0 refills | Status: DC

## 2018-09-19 MED ORDER — IPRATROPIUM-ALBUTEROL 20-100 MCG/ACT IN AERS: 1.0000 | INHALATION_SPRAY | Freq: Four times a day (QID) | RESPIRATORY_TRACT | 0 refills | Status: DC | PRN

## 2018-09-19 MED ORDER — BISOPROLOL FUMARATE 5 MG PO TABS: 2.5000 mg | ORAL_TABLET | Freq: Every day | ORAL | 0 refills | Status: DC

## 2018-09-19 MED ORDER — PREDNISONE 10 MG PO TABS: ORAL_TABLET | ORAL | 0 refills | Status: DC

## 2018-09-19 NOTE — Progress Notes (Signed)
Patient attempted to go for a walk around nurses station without 02. Started at 95% and dropped to 88 on return to room at about 180 feet,.2L Oxygen was reapplied and O2 saturation rechecked and it returned to 94%.

## 2018-09-19 NOTE — Plan of Care (Signed)
Reviewed discharge instructions with patient; copy given. IV removed. Patient ready for discharge.  

## 2018-09-19 NOTE — Discharge Summary (Signed)
Physician Discharge Summary  Patient ID: Cheryl Haley MRN: [id number] DOB/AGE: 02/18/1956 63 y.o.  Admit date: 09/15/2018 Discharge date: 09/19/2018  Admission Diagnoses:  Discharge Diagnoses:  Principal Problem:   COPD with acute exacerbation ([place]) Active Problems:   COPD (chronic obstructive pulmonary disease) with emphysema (HCC)   HTN (hypertension)   Tobacco abuse   Acute on chronic respiratory failure with hypoxia (H[facility])   Pulmonary nodules   Viral respiratory infection   Protein-calorie malnutrition, severe   Discharged Condition: stable  Hospital Course: Cheryl Haley a 63 year old female, with past medical history significant for severe COPD, TIA, HTN, hypothyroidism, luminary nodules, pleural effusion and tobacco abuse.  Patient was admitted with COPD with exacerbation after failing outpatient treatment.  Patient was admitted for further assessment and management.  Patient was managed with IV Solu-Medrol, nebulizer treatment as well as oral antibiotics.  Patient slowly improved.  Patient will be discharged back on today to the care of the primary care provider and pulmonologist.  Acute on chronic respiratory failure with hypoxia/COPD with acute exacerbation [facility]): Patient failed outpatient management.   Managed with IV Solu-Medrol, nebs Pulmicort, nebs DuoNeb, oral antibiotics and pulmonary toiletry.   Patient has slowly improved.   Patient be discharged back to the care of the primary care provider and pulmonologist.   We will continue home oxygen on discharge (apparently, patient was on home oxygen 2 L/min via nasal cannula).     Hypertension:  Blood pressure is currently on the low normal side.   Decreased bisoprolol from 5 Mg p.o. once daily to 2.5 mg p.o. once daily.   Change Cardizem from 135 mg p.o. twice daily to 60 mg p.o. twice daily on discharge. Continue to monitor blood pressure closely: And adjust medications accordingly..  Tobacco  abuse: Crrently in remission   Pulmonary nodulesfollow-up as an outpatient with pulmonology  Consults: None  Significant Diagnostic Studies:   Discharge Exam: Blood pressure 123/67, pulse 75, temperature 98.3 F (36.8 C), temperature source Oral, resp. rate 16, height 5\' 4"  (1.626 m), weight 46.3 kg, SpO2 95 %.  Disposition: Discharge disposition: 01-Home or Self Care   Discharge Instructions    Diet - low sodium heart healthy   Complete by:  As directed    Increase activity slowly   Complete by:  As directed      Allergies as of 09/19/2018   No Known Allergies     Medication List    STOP taking these medications   budesonide-formoterol 160-4.5 MCG/ACT inhaler Commonly known as:  SYMBICORT   doxycycline 100 MG tablet Commonly known as:  VIBRA-TABS   ipratropium 17 MCG/ACT inhaler Commonly known as:  ATROVENT HFA   metoprolol succinate 25 MG 24 hr tablet Commonly known as:  TOPROL-XL   NON FORMULARY   SYMBICORT 160-4.5 MCG/ACT inhaler Generic drug:  budesonide-formoterol     TAKE these medications   AEROCHAMBER MV inhaler Use as instructed   aspirin EC 81 MG tablet Take 81 mg daily by mouth.   bisoprolol 5 MG tablet Commonly known as:  ZEBETA Take 0.5 tablets (2.5 mg total) by mouth daily. Start taking on:  September 20, 2018   diltiazem 60 MG tablet Commonly known as:  CARDIZEM Take 1 tablet (60 mg total) by mouth 2 (two) times daily. What changed:    medication strength  how much to take   feeding supplement (ENSURE ENLIVE) Liqd Take 237 mLs by mouth 3 (three) times daily between meals.   Fluticasone-Umeclidin-Vilant 100-62.5-25 MCG/INH Aepb Commonly  known as:  TRELEGY ELLIPTA Inhale 1 puff into the lungs daily.   ipratropium-albuterol 0.5-2.5 (3) MG/3ML Soln Commonly known as:  DUONEB Take 3 mLs by nebulization every 6 (six) hours as needed. What changed:  reasons to take this   Ipratropium-Albuterol 20-100 MCG/ACT Aers  respimat Commonly known as:  COMBIVENT Inhale 1 puff into the lungs every 6 (six) hours as needed for wheezing or shortness of breath. What changed:  You were already taking a medication with the same name, and this prescription was added. Make sure you understand how and when to take each.   levothyroxine 100 MCG tablet Commonly known as:  SYNTHROID, LEVOTHROID Take 100 mcg daily before breakfast by mouth.   multivitamin tablet Take 1 tablet by mouth daily.   predniSONE 10 MG tablet Commonly known as:  DELTASONE Prednisone 60 mg po once daily for 3 days, then 40 mg po once daily for 3 days, then 30 mg po once daily for 3 days, then 20 mg po once daily for 3 days then 10 mg po once daily for 3 days and stop. What changed:    medication strength  how much to take  how to take this  when to take this  additional instructions   rosuvastatin 5 MG tablet Commonly known as:  CRESTOR Take 5 mg by mouth daily.      Time spent: 31 minutes  Signed: Bonnell Public 09/19/2018, 1:23 PM

## 2018-09-19 NOTE — Plan of Care (Signed)
Patient sitting up in bed this morning. No complaints of pain at this time. Congested cough, but no production this morning. States she did have some sputum last night twice that was very thick and difficult to expectorate. Will continue to monitor.

## 2018-09-20 ENCOUNTER — Ambulatory Visit: Payer: Medicaid Other | Admitting: Primary Care

## 2018-09-23 ENCOUNTER — Inpatient Hospital Stay: Payer: Medicaid Other

## 2018-09-23 ENCOUNTER — Encounter: Payer: Self-pay | Admitting: Internal Medicine

## 2018-09-23 ENCOUNTER — Telehealth: Payer: Self-pay | Admitting: Internal Medicine

## 2018-09-23 ENCOUNTER — Inpatient Hospital Stay: Payer: Medicaid Other | Attending: Internal Medicine | Admitting: Internal Medicine

## 2018-09-23 VITALS — BP 150/61 | HR 62 | Temp 97.5°F | Resp 19 | Ht 64.0 in | Wt 105.5 lb

## 2018-09-23 DIAGNOSIS — D649 Anemia, unspecified: Secondary | ICD-10-CM

## 2018-09-23 DIAGNOSIS — R918 Other nonspecific abnormal finding of lung field: Secondary | ICD-10-CM | POA: Diagnosis not present

## 2018-09-23 DIAGNOSIS — J439 Emphysema, unspecified: Secondary | ICD-10-CM | POA: Diagnosis not present

## 2018-09-23 DIAGNOSIS — Z7982 Long term (current) use of aspirin: Secondary | ICD-10-CM | POA: Diagnosis not present

## 2018-09-23 DIAGNOSIS — Z79899 Other long term (current) drug therapy: Secondary | ICD-10-CM | POA: Diagnosis not present

## 2018-09-23 DIAGNOSIS — D75839 Thrombocytosis, unspecified: Secondary | ICD-10-CM

## 2018-09-23 DIAGNOSIS — D473 Essential (hemorrhagic) thrombocythemia: Secondary | ICD-10-CM

## 2018-09-23 DIAGNOSIS — D7589 Other specified diseases of blood and blood-forming organs: Secondary | ICD-10-CM | POA: Diagnosis not present

## 2018-09-23 DIAGNOSIS — I10 Essential (primary) hypertension: Secondary | ICD-10-CM

## 2018-09-23 DIAGNOSIS — I700 Atherosclerosis of aorta: Secondary | ICD-10-CM

## 2018-09-23 LAB — CBC WITH DIFFERENTIAL/PLATELET
Abs Immature Granulocytes: 0.09 10*3/uL — ABNORMAL HIGH (ref 0.00–0.07)
Basophils Absolute: 0 10*3/uL (ref 0.0–0.1)
Basophils Relative: 0 %
EOS ABS: 0 10*3/uL (ref 0.0–0.5)
Eosinophils Relative: 0 %
HCT: 45.6 % (ref 36.0–46.0)
Hemoglobin: 14.6 g/dL (ref 12.0–15.0)
Immature Granulocytes: 1 %
Lymphocytes Relative: 8 %
Lymphs Abs: 1 10*3/uL (ref 0.7–4.0)
MCH: 30.2 pg (ref 26.0–34.0)
MCHC: 32 g/dL (ref 30.0–36.0)
MCV: 94.2 fL (ref 80.0–100.0)
Monocytes Absolute: 0.4 10*3/uL (ref 0.1–1.0)
Monocytes Relative: 3 %
Neutro Abs: 11.2 10*3/uL — ABNORMAL HIGH (ref 1.7–7.7)
Neutrophils Relative %: 88 %
Platelets: 470 10*3/uL — ABNORMAL HIGH (ref 150–400)
RBC: 4.84 MIL/uL (ref 3.87–5.11)
RDW: 14.5 % (ref 11.5–15.5)
WBC: 12.7 10*3/uL — ABNORMAL HIGH (ref 4.0–10.5)
nRBC: 0 % (ref 0.0–0.2)

## 2018-09-23 LAB — COMPREHENSIVE METABOLIC PANEL
ALT: 31 U/L (ref 0–44)
AST: 21 U/L (ref 15–41)
Albumin: 4.3 g/dL (ref 3.5–5.0)
Alkaline Phosphatase: 60 U/L (ref 38–126)
Anion gap: 11 (ref 5–15)
BUN: 17 mg/dL (ref 8–23)
CO2: 32 mmol/L (ref 22–32)
Calcium: 10 mg/dL (ref 8.9–10.3)
Chloride: 97 mmol/L — ABNORMAL LOW (ref 98–111)
Creatinine, Ser: 0.78 mg/dL (ref 0.44–1.00)
GFR calc Af Amer: >60 mL/min (ref >60)
GFR calc non Af Amer: >60 mL/min (ref >60)
Glucose, Bld: 98 mg/dL (ref 70–99)
Potassium: 4.2 mmol/L (ref 3.5–5.1)
Sodium: 140 mmol/L (ref 135–145)
Total Bilirubin: 0.5 mg/dL (ref 0.3–1.2)
Total Protein: 8 g/dL (ref 6.5–8.1)

## 2018-09-23 LAB — IRON AND TIBC
Iron: 136 ug/dL (ref 41–142)
SATURATION RATIOS: 47 % (ref 21–57)
TIBC: 290 ug/dL (ref 236–444)
UIBC: 154 ug/dL (ref 120–384)

## 2018-09-23 LAB — VITAMIN B12: Vitamin B-12: 653 pg/mL (ref 180–914)

## 2018-09-23 LAB — FOLATE: Folate: 15.6 ng/mL (ref >5.9)

## 2018-09-23 LAB — SEDIMENTATION RATE: Sed Rate: 5 mm/hr (ref 0–22)

## 2018-09-23 LAB — LACTATE DEHYDROGENASE: LDH: 226 U/L — AB (ref 98–192)

## 2018-09-23 LAB — FERRITIN: Ferritin: 142 ng/mL (ref 11–307)

## 2018-09-23 LAB — C-REACTIVE PROTEIN: CRP: <0.8 mg/dL (ref <1.0)

## 2018-09-23 NOTE — Progress Notes (Signed)
Referring Physician:  [name], MD  [facility] [address] [place], [place] [address]  Diagnosis Thrombocytosis [facility]) - Plan: CBC with Differential/Platelet, Comprehensive metabolic panel, Lactate dehydrogenase, Ferritin, Iron and TIBC, Transferrin Saturation, Protein electrophoresis, serum, Sedimentation rate, C-reactive protein, Rheumatoid factor, BCR-ABL1 FISH, JAK2 V617F, w Reflex to CALR/E12/MPL, Vitamin B12, Methylmalonic acid, serum, Folate  Normocytic anemia - Plan: CBC with Differential/Platelet, Comprehensive metabolic panel, Lactate dehydrogenase, Ferritin, Iron and TIBC, Transferrin Saturation, Protein electrophoresis, serum, Sedimentation rate, C-reactive protein, Rheumatoid factor, BCR-ABL1 FISH, JAK2 V617F, w Reflex to CALR/E12/MPL, Vitamin B12, Methylmalonic acid, serum, Folate  Staging Cancer Staging No matching staging information was found for the patient.  Assessment and Plan:  1.  Thrombocytosis.  63 yr old female referred for evaluation due to thrombocytosis.  She was hospitalized 09-15-2018 to 09-19-2018 due to COPD exacerbation and reports she was diagnosed with a viral infection.  Most recent labs done 09/17/2018 showed WBC 9.9 HB 12.3 plts 400,000.  Chemistries WNL with K+ 3.9 Cr 0.57.  Pt has history of pulmonary nodules and is followed by Pulmonary. CT scan done 07/13/2018 showed  Impression: 1. Interval development of 2 new pulmonary nodules measuring 11 mm (right upper lobe) and 8 mm (right lower lobe). These are likely infectious/inflammatory given the relatively rapid appearance. Non-contrast chest CT at 3-6 months is recommended. If the nodules are stable at time of repeat CT, then future CT at 18-24 months (from today's scan) is considered optional for low-risk patients, but is recommended for high-risk patients. This recommendation follows the consensus statement: Guidelines for Management of Incidental Pulmonary Nodules Detected on CT  Images: From the Fleischner Society 2017; Radiology 2017; 284:228-243. 2. Pulmonary nodules visualized on prior study are either stable or smaller in the interval. 3.  Emphysema. (ICD10-J43.9) 4.  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  She has history of smoking 2 ppd for 40 years.  She quit smoking in 2018.  She is on chronic prednisone and is currently on 60 mg daily.  She has chronic bruising of arms and thin skin.  She has been told by PCP she has history of elevated plts.  She has history of CVA.  She is on Baby ASA.  She denies any fevers, chills, night sweats and has noted no adenopathy.  She has not had mammogram done for several years.   She has never had colonoscopy done.  Pt is seen today for consultation due to thrombocytosis.    Labs repeated today 09/23/2018 reviewed and showed WCB 12.7 HB 14.6 plts 470,000.  Chemistries WNL with K+ 4.2 Cr 0.78 and normal LFTs.  Awaiting iron studies, Sed rate, CRP, RF, BCR/ABL and Jak2.  Pt will RTC in 2 weeks to go over results.  I have discussed with her that labs done on 09/17/2018 showed normal platelet count of 400,000 so fluctuation in counts may be due to steroids and a reactive process.    2.  Leucocytosis.  WBC elevated at 12.7.  She has a normal differential.  She is on Prednisone 60 mg daily.  Suspect mild elevation due to steroid use.  BCR/ABL pending.  Pt will follow-up in 2 weeks to go over labs.    3.  Normocytic anemia.  HB 14.  Awaiting iron studies, B12, Folate, SPEP and MMA.  Pt will RTC in 2 weeks to go over results.    4.  Pulmonary nodule.  Pt is  followed by pulmonary.  CT chest done 07/13/2018 reported 2 new pulmonary nodules.  She is reportedly set up for repeat imaging 10/04/2018.  Will follow-up results. Pt should follow-up with Pulmonary as directed.   5.  Bruising.  I discussed with her this is due to steroid usage.  Platelet count is WNL at  470,000  6.  COPD.  Pt on oxygen via [place].  Follow-up with pulmonary as directed.    7.  HTN.  BP is 150/61.  Follow-up with PCP.    8.  Health maintenance.  Pt was given option of mammogram scheduling and referral to GI.  She does not desire either and reports she will discuss this further with PCP.    40 minutes spent with more than 50% spent in review of records, counseling and coordination of care.     HPI:  63 yr old female referred for evaluation due to thrombocytosis.  She was hospitalized 09-15-2018 to 09-19-2018 due to COPD exacerbation and reports she was diagnosed with a viral infection.  Most recent labs done 09/17/2018 showed WBC 9.9 HB 12.3 plts 400,000.  Chemistries WNL with K+ 3.9 Cr 0.57.  Pt has history of pulmonary nodules and is followed by Pulmonary. CT scan done 07/13/2018 showed  Impression: 1. Interval development of 2 new pulmonary nodules measuring 11 mm (right upper lobe) and 8 mm (right lower lobe). These are likely infectious/inflammatory given the relatively rapid appearance. Non-contrast chest CT at 3-6 months is recommended. If the nodules are stable at time of repeat CT, then future CT at 18-24 months (from today's scan) is considered optional for low-risk patients, but is recommended for high-risk patients. This recommendation follows the consensus statement: Guidelines for Management of Incidental Pulmonary Nodules Detected on CT Images: From the Fleischner Society 2017; Radiology 2017; 284:228-243. 2. Pulmonary nodules visualized on prior study are either stable or smaller in the interval. 3.  Emphysema. (ICD10-J43.9) 4.  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  She has history of smoking 2 ppd for 40 years.  She quit smoking in 2018.  She is on chronic prednisone and is currently on 60 mg daily.  She has chronic bruising of arms and thin skin.  She has been told by PCP she has history  of elevated plts.  She has history of CVA.  She is on Baby ASA.  She denies any fevers, chills, night sweats and has noted no adenopathy.  She has not had mammogram done for several years.   She has never had colonoscopy done.  Pt is seen today for consultation due to thrombocytosis.    Problem List Patient Active Problem List   Diagnosis Date Noted  . Protein-calorie malnutrition, severe [E43] 09/17/2018  . Viral respiratory infection [J98.8, B97.89] 09/16/2018  . Pulmonary nodules [R91.8] 07/14/2018  . Pulmonary nodule, left [R91.1]   . Chronic respiratory failure with hypoxia ([place]) [J96.11] 06/10/2016  . Tobacco abuse [Z72.0] 11/28/2015  . Pleural effusion [J90] 05/15/2014  . COPD (chronic obstructive pulmonary disease) with emphysema ([place]) [J43.9] 09/20/2013  . COPD with acute exacerbation ([place]) [J44.1] 09/20/2013  . HTN (hypertension) [I10] 09/20/2013  . Unspecified hypothyroidism [E03.9] 09/20/2013    Past Medical History Past Medical History:  Diagnosis Date  . Anxiety   . 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 COPD (chronic obstructive pulmonary disease) ([place])   . History of hiatal hernia   . Hx of TIA (transient  ischemic attack) and stroke   . Hypertension   . Hypothyroidism   . Pneumonia   . Thyroid disease     Past Surgical History Past Surgical History:  Procedure Laterality Date  . ESOPHAGOGASTRODUODENOSCOPY    . VIDEO BRONCHOSCOPY WITH ENDOBRONCHIAL NAVIGATION Left 07/29/2017   Procedure: VIDEO BRONCHOSCOPY WITH ENDOBRONCHIAL NAVIGATION;  Surgeon: Collene Gobble, MD;  Location: [facility] OR;  Service: Thoracic;  Laterality: Left;    Family History Family History  Problem Relation Age of Onset  . Emphysema Father   . Heart disease Father   . Emphysema Brother   . Emphysema Sister   . Emphysema Sister   . Heart disease Mother   . Heart disease Sister   . Heart disease Brother   . Heart disease Brother   . Congestive Heart Failure Sister   . Congestive Heart Failure  Sister   . Cancer Sister        lung  . Cancer Sister        breast     Social History  reports that she quit smoking about 18 months ago. Her smoking use included cigarettes. She has a 80.00 pack-year smoking history. She has never used smokeless tobacco. She reports current alcohol use of about 14.0 standard drinks of alcohol per week. She reports that she does not use drugs.  Medications  Current Outpatient Medications:  .  aspirin EC 81 MG tablet, Take 81 mg daily by mouth. , Disp: , Rfl:  .  bisoprolol (ZEBETA) 5 MG tablet, Take 0.5 tablets (2.5 mg total) by mouth daily., Disp: 30 tablet, Rfl: 0 .  diltiazem (CARDIZEM) 60 MG tablet, Take 1 tablet (60 mg total) by mouth 2 (two) times daily., Disp: 60 tablet, Rfl: 0 .  feeding supplement, ENSURE ENLIVE, (ENSURE ENLIVE) LIQD, Take 237 mLs by mouth 3 (three) times daily between meals., Disp: 237 mL, Rfl: 12 .  Ipratropium-Albuterol (COMBIVENT) 20-100 MCG/ACT AERS respimat, Inhale 1 puff into the lungs every 6 (six) hours as needed for wheezing or shortness of breath., Disp: 1 Inhaler, Rfl: 0 .  ipratropium-albuterol (DUONEB) 0.5-2.5 (3) MG/3ML SOLN, Take 3 mLs by nebulization every 6 (six) hours as needed. (Patient taking differently: Take 3 mLs by nebulization every 6 (six) hours as needed (sob and wheezing). ), Disp: 360 mL, Rfl: 5 .  levothyroxine (SYNTHROID, LEVOTHROID) 100 MCG tablet, Take 100 mcg daily before breakfast by mouth., Disp: , Rfl:  .  Multiple Vitamin (MULTIVITAMIN) tablet, Take 1 tablet by mouth daily., Disp: , Rfl:  .  predniSONE (DELTASONE) 10 MG tablet, Prednisone 60 mg po once daily for 3 days, then 40 mg po once daily for 3 days, then 30 mg po once daily for 3 days, then 20 mg po once daily for 3 days then 10 mg po once daily for 3 days and stop., Disp: 48 tablet, Rfl: 0 .  rosuvastatin (CRESTOR) 5 MG tablet, Take 5 mg by mouth daily., Disp: , Rfl:  .  Fluticasone-Umeclidin-Vilant (TRELEGY ELLIPTA) 100-62.5-25 MCG/INH  AEPB, Inhale 1 puff into the lungs daily. (Patient not taking: Reported on 09/15/2018), Disp: 60 each, Rfl: 11 .  Spacer/Aero-Holding Chambers (AEROCHAMBER MV) inhaler, Use as instructed, Disp: 1 each, Rfl: 0  Allergies Patient has no known allergies.  Review of Systems Review of Systems - Oncology ROS negative other than SOB, bruises on arms.     Physical Exam  Vitals Wt Readings from Last 3 Encounters:  09/23/18 105 lb 8 oz (47.9 kg)  09/15/18 102 lb (46.3 kg)  09/08/18 104 lb (47.2 kg)   Temp Readings from Last 3 Encounters:  09/23/18 (!) 97.5 F (36.4 C) (Oral)  09/19/18 97.6 F (36.4 C) (Oral)  01/28/18 (!) 97.3 F (36.3 C) (Oral)   BP Readings from Last 3 Encounters:  09/23/18 (!) 150/61  09/19/18 137/69  09/08/18 126/74   Pulse Readings from Last 3 Encounters:  09/23/18 62  09/19/18 74  09/08/18 80    Constitutional: Well-developed, well-nourished, and in no distress.   HENT: Head: Normocephalic and atraumatic.  Mouth/Throat: No oropharyngeal exudate. Mucosa moist. Eyes: Pupils are equal, round, and reactive to light. Conjunctivae are normal. No scleral icterus.  Neck: Normal range of motion. Neck supple. No JVD present.  Cardiovascular: Normal rate, regular rhythm and normal heart sounds.  Exam reveals no gallop and no friction rub.   No murmur heard. Pulmonary/Chest: Effort normal.  Coarse BS.  No respiratory distress. No wheezes.No rales. Oxygen via [place] Abdominal: Soft. Bowel sounds are normal. No distension. There is no tenderness. There is no guarding.  Musculoskeletal: No edema or tenderness.  Lymphadenopathy: No cervical,axillary or supraclavicular adenopathy.  Neurological: Alert and oriented to person, place, and time. No cranial nerve deficit.  Skin: Skin is warm and dry. No rash noted. No erythema. No pallor. Bruises and thin skin noted on arms.   Psychiatric: Affect and judgment normal.   Labs No visits with results within 3 Day(s) from this  visit.  Latest known visit with results is:  Admission on 09/15/2018, Discharged on 09/19/2018  Component Date Value Ref Range Status  . Sodium 09/15/2018 137  135 - 145 mmol/L Final  . Potassium 09/15/2018 4.3  3.5 - 5.1 mmol/L Final  . Chloride 09/15/2018 95* 98 - 111 mmol/L Final  . CO2 09/15/2018 31  22 - 32 mmol/L Final  . Glucose, Bld 09/15/2018 96  70 - 99 mg/dL Final  . BUN 09/15/2018 17  8 - 23 mg/dL Final  . Creatinine, Ser 09/15/2018 0.68  0.44 - 1.00 mg/dL Final  . Calcium 09/15/2018 9.6  8.9 - 10.3 mg/dL Final  . GFR calc non Af Amer 09/15/2018 >60  >60 mL/min Final  . GFR calc Af Amer 09/15/2018 >60  >60 mL/min Final  . Anion gap 09/15/2018 11  5 - 15 Final   Performed at [facility], [place] [address], [place],  [address]  . WBC 09/15/2018 9.1  4.0 - 10.5 K/uL Final  . RBC 09/15/2018 4.78  3.87 - 5.11 MIL/uL Final  . Hemoglobin 09/15/2018 14.6  12.0 - 15.0 g/dL Final  . HCT 09/15/2018 45.9  36.0 - 46.0 % Final  . MCV 09/15/2018 96.0  80.0 - 100.0 fL Final  . MCH 09/15/2018 30.5  26.0 - 34.0 pg Final  . MCHC 09/15/2018 31.8  30.0 - 36.0 g/dL Final  . RDW 09/15/2018 14.3  11.5 - 15.5 % Final  . Platelets 09/15/2018 515* 150 - 400 K/uL Final  . nRBC 09/15/2018 0.0  0.0 - 0.2 % Final  . Neutrophils Relative % 09/15/2018 59  % Final  . Neutro Abs 09/15/2018 5.4  1.7 - 7.7 K/uL Final  . Lymphocytes Relative 09/15/2018 30  % Final  . Lymphs Abs 09/15/2018 2.7  0.7 - 4.0 K/uL Final  . Monocytes Relative 09/15/2018 9  % Final  . Monocytes Absolute 09/15/2018 0.8  0.1 - 1.0 K/uL Final  . Eosinophils Relative 09/15/2018 1  % Final  . Eosinophils Absolute 09/15/2018  0.1  0.0 - 0.5 K/uL Final  . Basophils Relative 09/15/2018 1  % Final  . Basophils Absolute 09/15/2018 0.1  0.0 - 0.1 K/uL Final  . Immature Granulocytes 09/15/2018 0  % Final  . Abs Immature Granulocytes 09/15/2018 0.03  0.00 - 0.07 K/uL Final   Performed at [facility],  [place] [address], [place], [place] [address]  . O2 Content 09/15/2018 2.0  L/min Final  . Delivery systems 09/15/2018 NASAL CANNULA   Final  . pH, Ven 09/15/2018 7.401  7.250 - 7.430 Final  . pCO2, Ven 09/15/2018 52.7  44.0 - 60.0 mmHg Final  . pO2, Ven 09/15/2018 46.3* 32.0 - 45.0 mmHg Final  . Bicarbonate 09/15/2018 32.2* 20.0 - 28.0 mmol/L Final  . Acid-Base Excess 09/15/2018 6.1* 0.0 - 2.0 mmol/L Final  . O2 Saturation 09/15/2018 80.2  % Final  . Patient temperature 09/15/2018 97.7   Final  . Collection site 09/15/2018 VEIN   Final  . Drawn by 09/15/2018 COLLECTED BY NURSE   Final  . Sample type 09/15/2018 VENOUS   Final   Performed at [facility], [place] [address], [place], [place] [address]  . HIV Screen 4th Generation wRfx 09/16/2018 Non Reactive  Non Reactive Final   Comment: (NOTE) Performed At: [facility] [place], [place] [id number] Rush Farmer MD ER:[id number]   . Adenovirus 09/16/2018 NOT DETECTED  NOT DETECTED Final  . Coronavirus 229E 09/16/2018 NOT DETECTED  NOT DETECTED Final  . Coronavirus HKU1 09/16/2018 NOT DETECTED  NOT DETECTED Final  . Coronavirus NL63 09/16/2018 NOT DETECTED  NOT DETECTED Final  . Coronavirus OC43 09/16/2018 NOT DETECTED  NOT DETECTED Final  . Metapneumovirus 09/16/2018 DETECTED* NOT DETECTED Final  . Rhinovirus / Enterovirus 09/16/2018 NOT DETECTED  NOT DETECTED Final  . Influenza A 09/16/2018 NOT DETECTED  NOT DETECTED Final  . Influenza B 09/16/2018 NOT DETECTED  NOT DETECTED Final  . Parainfluenza Virus 1 09/16/2018 NOT DETECTED  NOT DETECTED Final  . Parainfluenza Virus 2 09/16/2018 NOT DETECTED  NOT DETECTED Final  . Parainfluenza Virus 3 09/16/2018 NOT DETECTED  NOT DETECTED Final  . Parainfluenza Virus 4 09/16/2018 NOT DETECTED  NOT DETECTED Final  . Respiratory Syncytial Virus 09/16/2018 NOT DETECTED  NOT DETECTED Final  . Bordetella pertussis 09/16/2018 NOT DETECTED  NOT DETECTED Final  .  Chlamydophila pneumoniae 09/16/2018 NOT DETECTED  NOT DETECTED Final  . Mycoplasma pneumoniae 09/16/2018 NOT DETECTED  NOT DETECTED Final   Performed at [facility], [place] [address] [place], [place] [address]  . Magnesium 09/16/2018 2.0  1.7 - 2.4 mg/dL Final   Performed at [place] [address] [place], [place] [address]  . Phosphorus 09/16/2018 3.3  2.5 - 4.6 mg/dL Final   Performed at [place] [address] [place], [place] [address]  . TSH 09/16/2018 1.547  0.350 - 4.500 uIU/mL Final   Comment: Performed by a 3rd Generation assay with a functional sensitivity of <=0.01 uIU/mL. Performed at [facility], [place] [address], [place], [place] [address]   . Sodium 09/16/2018 137  135 - 145 mmol/L Final  . Potassium 09/16/2018 4.4  3.5 - 5.1 mmol/L Final  . Chloride 09/16/2018 96* 98 - 111 mmol/L Final  . CO2 09/16/2018 31  22 - 32 mmol/L Final  . Glucose, Bld 09/16/2018 101* 70 - 99 mg/dL Final  . BUN 09/16/2018 16  8 - 23 mg/dL Final  .  Creatinine, Ser 09/16/2018 0.64  0.44 - 1.00 mg/dL Final  . Calcium 09/16/2018 9.0  8.9 - 10.3 mg/dL Final  . Total Protein 09/16/2018 7.0  6.5 - 8.1 g/dL Final  . Albumin 09/16/2018 3.9  3.5 - 5.0 g/dL Final  . AST 09/16/2018 18  15 - 41 U/L Final  . ALT 09/16/2018 15  0 - 44 U/L Final  . Alkaline Phosphatase 09/16/2018 48  38 - 126 U/L Final  . Total Bilirubin 09/16/2018 0.6  0.3 - 1.2 mg/dL Final  . GFR calc non Af Amer 09/16/2018 >60  >60 mL/min Final  . GFR calc Af Amer 09/16/2018 >60  >60 mL/min Final  . Anion gap 09/16/2018 10  5 - 15 Final   Performed at [facility], [place] [address], [place], [place] [address]  . WBC 09/16/2018 6.9  4.0 - 10.5 K/uL Final  . RBC 09/16/2018 4.34  3.87 - 5.11 MIL/uL Final  . Hemoglobin 09/16/2018 13.0  12.0 - 15.0 g/dL Final  . HCT 09/16/2018 41.1  36.0 - 46.0 % Final  . MCV 09/16/2018 94.7  80.0 - 100.0 fL Final  .  MCH 09/16/2018 30.0  26.0 - 34.0 pg Final  . MCHC 09/16/2018 31.6  30.0 - 36.0 g/dL Final  . RDW 09/16/2018 14.2  11.5 - 15.5 % Final  . Platelets 09/16/2018 430* 150 - 400 K/uL Final  . nRBC 09/16/2018 0.0  0.0 - 0.2 % Final   Performed at [facility], [place] [address], [place], [place] [address]  . Sodium 09/17/2018 139  135 - 145 mmol/L Final  . Potassium 09/17/2018 3.9  3.5 - 5.1 mmol/L Final  . Chloride 09/17/2018 102  98 - 111 mmol/L Final  . CO2 09/17/2018 30  22 - 32 mmol/L Final  . Glucose, Bld 09/17/2018 140* 70 - 99 mg/dL Final  . BUN 09/17/2018 20  8 - 23 mg/dL Final  . Creatinine, Ser 09/17/2018 0.57  0.44 - 1.00 mg/dL Final  . Calcium 09/17/2018 9.1  8.9 - 10.3 mg/dL Final  . GFR calc non Af Amer 09/17/2018 >60  >60 mL/min Final  . GFR calc Af Amer 09/17/2018 >60  >60 mL/min Final  . Anion gap 09/17/2018 7  5 - 15 Final   Performed at [facility], [place] [address], [place], [place] [address]  . WBC 09/17/2018 9.9  4.0 - 10.5 K/uL Final  . RBC 09/17/2018 4.01  3.87 - 5.11 MIL/uL Final  . Hemoglobin 09/17/2018 12.3  12.0 - 15.0 g/dL Final  . HCT 09/17/2018 38.5  36.0 - 46.0 % Final  . MCV 09/17/2018 96.0  80.0 - 100.0 fL Final  . MCH 09/17/2018 30.7  26.0 - 34.0 pg Final  . MCHC 09/17/2018 31.9  30.0 - 36.0 g/dL Final  . RDW 09/17/2018 14.5  11.5 - 15.5 % Final  . Platelets 09/17/2018 400  150 - 400 K/uL Final  . nRBC 09/17/2018 0.0  0.0 - 0.2 % Final   Performed at [facility], [place] [address] [place], [place] [address]     Pathology Orders Placed This Encounter  Procedures  . CBC with Differential/Platelet    Standing Status:   Future    Standing Expiration Date:   09/24/2019  . Comprehensive metabolic panel    Standing Status:   Future    Standing Expiration Date:   09/24/2019  . Lactate dehydrogenase    Standing Status:   Future  Standing Expiration Date:   09/24/2019  . Ferritin    Standing Status:    Future    Standing Expiration Date:   09/24/2019  . Iron and TIBC    Standing Status:   Future    Standing Expiration Date:   09/24/2019  . Transferrin Saturation    Standing Status:   Future    Standing Expiration Date:   09/24/2019  . Protein electrophoresis, serum    Standing Status:   Future    Standing Expiration Date:   09/24/2019  . Sedimentation rate    Standing Status:   Future    Standing Expiration Date:   09/24/2019  . C-reactive protein    Standing Status:   Future    Standing Expiration Date:   09/24/2019  . Rheumatoid factor    Standing Status:   Future    Standing Expiration Date:   09/24/2019  . BCR-ABL1 FISH    Standing Status:   Future    Standing Expiration Date:   09/24/2019  . JAK2 V617F, w Reflex to CALR/E12/MPL    Standing Status:   Future    Standing Expiration Date:   09/24/2019  . Vitamin B12    Standing Status:   Future    Standing Expiration Date:   09/24/2019  . Methylmalonic acid, serum    Standing Status:   Future    Standing Expiration Date:   09/24/2019  . Folate    Standing Status:   Future    Standing Expiration Date:   09/24/2019       Zoila Shutter MD

## 2018-09-23 NOTE — Telephone Encounter (Signed)
Scheduled appt per 01/23 los.  Printed calendar and avs.  Checked patient for addon lab.

## 2018-09-24 LAB — PROTEIN ELECTROPHORESIS, SERUM
A/G Ratio: 1.3 (ref 0.7–1.7)
Albumin ELP: 3.9 g/dL (ref 2.9–4.4)
Alpha-1-Globulin: 0.1 g/dL (ref 0.0–0.4)
Alpha-2-Globulin: 0.9 g/dL (ref 0.4–1.0)
Beta Globulin: 0.8 g/dL (ref 0.7–1.3)
Gamma Globulin: 1 g/dL (ref 0.4–1.8)
Globulin, Total: 2.9 g/dL (ref 2.2–3.9)
Total Protein ELP: 6.8 g/dL (ref 6.0–8.5)

## 2018-09-24 LAB — RHEUMATOID FACTOR: Rhuematoid fact SerPl-aCnc: <10 IU/mL (ref 0.0–13.9)

## 2018-09-28 ENCOUNTER — Encounter: Payer: Self-pay | Admitting: Primary Care

## 2018-09-28 ENCOUNTER — Ambulatory Visit (INDEPENDENT_AMBULATORY_CARE_PROVIDER_SITE_OTHER): Payer: Medicaid Other | Admitting: Primary Care

## 2018-09-28 VITALS — BP 104/60 | HR 81 | Temp 98.2°F | Wt 109.6 lb

## 2018-09-28 DIAGNOSIS — J439 Emphysema, unspecified: Secondary | ICD-10-CM

## 2018-09-28 DIAGNOSIS — J9611 Chronic respiratory failure with hypoxia: Secondary | ICD-10-CM

## 2018-09-28 NOTE — Assessment & Plan Note (Addendum)
-   Recently hospitalized for acute exacerbation - Doing well today, no cough or shortness of breath - Exam benign - Trelegy not covered, continue Symbicort 160 two puffs twice daily - PRN duoneb/combivent q6hrs

## 2018-09-28 NOTE — Patient Instructions (Addendum)
Resume Symbicort 160- two puffs twice daily   Duonebs every 6 hours as needed for sob/wheezing   Return in 3 months with [name] or sooner if symptoms worsen

## 2018-09-28 NOTE — Progress Notes (Signed)
Reviewed, agree 

## 2018-09-28 NOTE — Assessment & Plan Note (Addendum)
-   O2 saturation 87% RA with ambulation - Patient is at her baseline health, needs 2L oxygen on exertion at all times

## 2018-09-28 NOTE — Progress Notes (Addendum)
@Patient  ID: Cheryl Haley, female    DOB: Jan 12, 1956, 63 y.o.   MRN: [id number]  Chief Complaint  Patient presents with  . Follow-up    Doing a little better breathing since last visit - able to increase activity at home -02 2L    Referring provider: [name], MD  HPI: 63 year old female, former smoker quit in 2018 (80 pack year hx). PMH significant for COPD with emphysema, chronic respiratory failure with hypoxia, pleural effusion, pulmonary nodules, hypertension. Patient of [name], last seen on 09/08/18 for COPD exacerbation.   Recently hospitalized from 1/15- 09/19/18 after failing outpatient management for COPD flare. Received IV solu-medrol, Duonebs, oral antibiotics and pulmonary toiletry. Continued home oxygen on discharge. Started on Trelegy and given prednisone taper. BP on low-normal side, bisoprolol decreased to 2.5mg  daily and Cardizem to 60mg  twice daily.   09/28/2018 Patient presents today for a follow-up visit. Feeling better, pretty much back to baseline. Still has some wheezing. Using Duoneb twice daily and Combivent rescue inhaler once daily. Insurance wouldn't pay for Trelegy. O2 was 87% RA with ambulation. Has home oxygen with her today and O2 98% 2L. Has 6 days left of prednisone. Denies cough or sob.    No Known Allergies  Immunization History  Administered Date(s) Administered  . Influenza Split 05/31/2015  . Influenza,inj,Quad PF,6+ Mos 09/22/2013, 06/10/2016, 06/25/2017, 04/30/2018  . Pneumococcal Conjugate-13 04/17/2015  . Pneumococcal Polysaccharide-23 12/08/2016    Past Medical History:  Diagnosis Date  . Anxiety   . 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 COPD (chronic obstructive pulmonary disease) ([place])   . History of hiatal hernia   . Hx of TIA (transient ischemic attack) and stroke   . Hypertension   . Hypothyroidism   . Pneumonia   . Thyroid disease     Tobacco History: Social History   Tobacco Use  Smoking Status Former Smoker  .  Packs/day: 2.00  . Years: 40.00  . Pack years: 80.00  . Types: Cigarettes  . Last attempt to quit: 03/16/2017  . Years since quitting: 1.5  Smokeless Tobacco Never Used  Tobacco Comment       Counseling given: Not Answered Comment:     Outpatient Medications Prior to Visit  Medication Sig Dispense Refill  . aspirin EC 81 MG tablet Take 81 mg daily by mouth.     . bisoprolol (ZEBETA) 5 MG tablet Take 0.5 tablets (2.5 mg total) by mouth daily. 30 tablet 0  . diltiazem (CARDIZEM) 60 MG tablet Take 1 tablet (60 mg total) by mouth 2 (two) times daily. 60 tablet 0  . feeding supplement, ENSURE ENLIVE, (ENSURE ENLIVE) LIQD Take 237 mLs by mouth 3 (three) times daily between meals. 237 mL 12  . Ipratropium-Albuterol (COMBIVENT) 20-100 MCG/ACT AERS respimat Inhale 1 puff into the lungs every 6 (six) hours as needed for wheezing or shortness of breath. 1 Inhaler 0  . ipratropium-albuterol (DUONEB) 0.5-2.5 (3) MG/3ML SOLN Take 3 mLs by nebulization every 6 (six) hours as needed. (Patient taking differently: Take 3 mLs by nebulization every 6 (six) hours as needed (sob and wheezing). ) 360 mL 5  . levothyroxine (SYNTHROID, LEVOTHROID) 100 MCG tablet Take 100 mcg daily before breakfast by mouth.    . Multiple Vitamin (MULTIVITAMIN) tablet Take 1 tablet by mouth daily.    . predniSONE (DELTASONE) 10 MG tablet Prednisone 60 mg po once daily for 3 days, then 40 mg po once daily for 3 days, then 30 mg  po once daily for 3 days, then 20 mg po once daily for 3 days then 10 mg po once daily for 3 days and stop. 48 tablet 0  . rosuvastatin (CRESTOR) 5 MG tablet Take 5 mg by mouth daily.    Marland Kitchen Spacer/Aero-Holding Chambers (AEROCHAMBER MV) inhaler Use as instructed 1 each 0  . Fluticasone-Umeclidin-Vilant (TRELEGY ELLIPTA) 100-62.5-25 MCG/INH AEPB Inhale 1 puff into the lungs daily. (Patient not taking: Reported on 09/28/2018) 60 each 11   No facility-administered medications prior to visit.     Review of  Systems  Review of Systems  Constitutional: Negative.   HENT: Negative.   Respiratory: Positive for wheezing. Negative for cough and shortness of breath.   Cardiovascular: Negative.     Physical Exam  BP 104/60 (BP Location: Right Arm, Patient Position: Sitting, Cuff Size: Normal)   Pulse 81   Temp 98.2 F (36.8 C)   Wt 109 lb 9.6 oz (49.7 kg)   SpO2 (!) 87% Comment: off 02 in lobby and ambulated to room - placed on 02 in room 2L and SPO2 at 98%  BMI 18.81 kg/m  Physical Exam Constitutional:      Appearance: Normal appearance.  HENT:     Head: Normocephalic and atraumatic.     Nose: Nose normal.     Mouth/Throat:     Mouth: Mucous membranes are moist.     Pharynx: Oropharynx is clear.  Eyes:     Extraocular Movements: Extraocular movements intact.     Pupils: Pupils are equal, round, and reactive to light.  Neck:     Musculoskeletal: Normal range of motion and neck supple.  Cardiovascular:     Rate and Rhythm: Normal rate and regular rhythm.  Pulmonary:     Effort: Pulmonary effort is normal. No respiratory distress.     Breath sounds: Normal breath sounds. No wheezing or rhonchi.     Comments: O2 sat 98% 2L  Neurological:     General: No focal deficit present.     Mental Status: She is alert and oriented to person, place, and time. Mental status is at baseline.  Psychiatric:        Mood and Affect: Mood normal.        Behavior: Behavior normal.        Thought Content: Thought content normal.        Judgment: Judgment normal.      Lab Results:  CBC    Component Value Date/Time   WBC 12.7 (H) 09/23/2018 1119   RBC 4.84 09/23/2018 1119   HGB 14.6 09/23/2018 1119   HCT 45.6 09/23/2018 1119   PLT 470 (H) 09/23/2018 1119   MCV 94.2 09/23/2018 1119   MCH 30.2 09/23/2018 1119   MCHC 32.0 09/23/2018 1119   RDW 14.5 09/23/2018 1119   LYMPHSABS 1.0 09/23/2018 1119   MONOABS 0.4 09/23/2018 1119   EOSABS 0.0 09/23/2018 1119   BASOSABS 0.0 09/23/2018 1119     BMET    Component Value Date/Time   NA 140 09/23/2018 1119   K 4.2 09/23/2018 1119   CL 97 (L) 09/23/2018 1119   CO2 32 09/23/2018 1119   GLUCOSE 98 09/23/2018 1119   BUN 17 09/23/2018 1119   CREATININE 0.78 09/23/2018 1119   CALCIUM 10.0 09/23/2018 1119   GFRNONAA >60 09/23/2018 1119   GFRAA >60 09/23/2018 1119    BNP No results found for: BNP  ProBNP    Component Value Date/Time   PROBNP 256.1 (H) 09/20/2013  1640    Imaging: Dg Chest 2 View  Result Date: 09/15/2018 CLINICAL DATA:  Two week history of shortness of breath that acutely worsened yesterday. Patient recently completed antibiotic and corticosteroid therapy. EXAM: CHEST - 2 VIEW COMPARISON:  CT chest 07/13/2018 and earlier. Chest x-rays 07/29/2017 and earlier. FINDINGS: Cardiac silhouette upper normal in size, unchanged. Thoracic aorta atherosclerotic, unchanged. Hilar and mediastinal contours otherwise unremarkable. Emphysematous changes throughout both lungs with marked hyperinflation. Pleuroparenchymal scarring at the RIGHT base accounting for the blunted costophrenic angle. RIGHT lung nodules identified on the prior CT are no longer visible and presumably represented infectious etiology. Prominent bronchovascular markings diffusely and moderate central peribronchial thickening, unchanged to perhaps slightly less than on prior examinations. Lungs otherwise clear. No localized airspace consolidation. No pleural effusions. No pneumothorax. Normal pulmonary vascularity. Visualized bony thorax intact. IMPRESSION: 1. No acute cardiopulmonary disease. 2. Aortic Atherosclerosis (ICD10-I70.0) and Emphysema (ICD10-J43.9). Electronically Signed   By: Evangeline Dakin M.D.   On: 09/15/2018 13:42     Assessment & Plan:   COPD (chronic obstructive pulmonary disease) with emphysema ([place]) - Recently hospitalized for acute exacerbation - Doing well today, no cough or shortness of breath - Exam benign - Trelegy not covered,  continue Symbicort 160 two puffs twice daily - PRN duoneb/combivent q6hrs   Chronic respiratory failure with hypoxia (HCC) - O2 saturation 87% RA with ambulation - Patient is at her baseline health, needs 2L oxygen on exertion at all times    Martyn Ehrich, NP 09/28/2018

## 2018-09-30 LAB — METHYLMALONIC ACID, SERUM: Methylmalonic Acid, Quantitative: 289 nmol/L (ref 0–378)

## 2018-10-04 ENCOUNTER — Ambulatory Visit (INDEPENDENT_AMBULATORY_CARE_PROVIDER_SITE_OTHER)
Admission: RE | Admit: 2018-10-04 | Discharge: 2018-10-04 | Disposition: A | Discharge status: Home / Self Care | Payer: Medicaid Other | Source: Ambulatory Visit | Attending: Pulmonary Disease | Admitting: Pulmonary Disease

## 2018-10-04 DIAGNOSIS — R911 Solitary pulmonary nodule: Secondary | ICD-10-CM | POA: Diagnosis not present

## 2018-10-07 ENCOUNTER — Ambulatory Visit: Payer: Medicaid Other | Admitting: Internal Medicine

## 2018-10-13 LAB — JAK2 (INCLUDING V617F AND EXON 12), MPL,& CALR W/RFL MPN PANEL (NGS)

## 2018-10-13 LAB — BCR ABL1 FISH (GENPATH)

## 2018-11-10 ENCOUNTER — Other Ambulatory Visit: Payer: Self-pay | Admitting: Pulmonary Disease

## 2018-12-24 ENCOUNTER — Telehealth: Payer: Self-pay | Admitting: Internal Medicine

## 2018-12-24 NOTE — Telephone Encounter (Signed)
Scheduled appt per 4/24 sch message - unable to reach patient . Left message with appt date and time

## 2018-12-29 ENCOUNTER — Inpatient Hospital Stay: Payer: Medicaid Other | Attending: Internal Medicine | Admitting: Internal Medicine

## 2018-12-29 DIAGNOSIS — D473 Essential (hemorrhagic) thrombocythemia: Secondary | ICD-10-CM

## 2018-12-29 DIAGNOSIS — D75839 Thrombocytosis, unspecified: Secondary | ICD-10-CM

## 2018-12-29 NOTE — Progress Notes (Signed)
Virtual Visit via Telephone Note  I connected with Cheryl Haley on 12/29/18 at 10:30 AM EDT by telephone and verified that I am speaking with the correct person using two identifiers.   I discussed the limitations, risks, security and privacy concerns of performing an evaluation and management service by telephone and the availability of in person appointments. I also discussed with the patient that there may be a patient responsible charge related to this service. The patient expressed understanding and agreed to proceed.   Interval History:  Historical data obtained from note dated 09/23/2018.  63 yr old female referred for evaluation due to thrombocytosis.  She was hospitalized 09-15-2018 to 09-19-2018 due to COPD exacerbation and reports she was diagnosed with a viral infection.  Most recent labs done 09/17/2018 showed WBC 9.9 HB 12.3 plts 400,000.  Chemistries WNL with K+ 3.9 Cr 0.57.  Pt has history of pulmonary nodules and is followed by Pulmonary. CT scan done 07/13/2018 showed  Impression: 1. Interval development of 2 new pulmonary nodules measuring 11 mm (right upper lobe) and 8 mm (right lower lobe). These are likely infectious/inflammatory given the relatively rapid appearance. Non-contrast chest CT at 3-6 months is recommended. If the nodules are stable at time of repeat CT, then future CT at 18-24 months (from today's scan) is considered optional for low-risk patients, but is recommended for high-risk patients. This recommendation follows the consensus statement: Guidelines for Management of Incidental Pulmonary Nodules Detected on CT Images: From the Fleischner Society 2017; Radiology 2017; 284:228-243. 2. Pulmonary nodules visualized on prior study are either stable or smaller in the interval. 3.  Emphysema. (ICD10-J43.9) 4.  Aortic Atherosclerois (ICD10-170.0)  She has history of smoking 2 ppd for 40 years.  She quit smoking in 2018.  She is on chronic prednisone and is  currently on 60 mg daily.  She has chronic bruising of arms and thin skin.  She has been told by PCP she has history of elevated plts.  She has history of CVA.  She is on Baby ASA.  She denies any fevers, chills, night sweats and has noted no adenopathy.  She has not had mammogram done for several years.   She has never had colonoscopy done.  Pt is seen today for consultation due to thrombocytosis.     Observations/Objective:Review labs from 09/23/2018.     Assessment and Plan: 1.  Thrombocytosis.  63 yr old female referred for evaluation due to thrombocytosis.  She was hospitalized 09-15-2018 to 09-19-2018 due to COPD exacerbation and reports she was diagnosed with a viral infection.  Most recent labs done 09/17/2018 showed WBC 9.9 HB 12.3 plts 400,000.  Chemistries WNL with K+ 3.9 Cr 0.57.  Pt has history of pulmonary nodules and is followed by Pulmonary. CT scan done 07/13/2018 showed  Impression: 1. Interval development of 2 new pulmonary nodules measuring 11 mm (right upper lobe) and 8 mm (right lower lobe). These are likely infectious/inflammatory given the relatively rapid appearance. Non-contrast chest CT at 3-6 months is recommended. If the nodules are stable at time of repeat CT, then future CT at 18-24 months (from today's scan) is considered optional for low-risk patients, but is recommended for high-risk patients. This recommendation follows the consensus statement: Guidelines for Management of Incidental Pulmonary Nodules Detected on CT Images: From the Fleischner Society 2017; Radiology 2017; 284:228-243. 2. Pulmonary nodules visualized on prior study are either stable or smaller in the interval. 3.  Emphysema. (ICD10-J43.9) 4.  Aortic Atherosclerois (ICD10-170.0)  She  has history of smoking 2 ppd for 40 years.  She quit smoking in 2018.  She was on chronic prednisone and is currently on 60 mg daily.  She has chronic bruising of arms and thin skin.  She has been told by PCP she has  history of elevated plts.  She has history of CVA.  She is on Baby ASA.  She denies any fevers, chills, night sweats and has noted no adenopathy.  She has not had mammogram done for several years.   She has never had colonoscopy done.   Labs done 09/23/2018 reviewed and showed WCB 12.7 HB 14.6 plts 470,000.  Chemistries WNL with K+ 4.2 Cr 0.78 and normal LFTs.  Ferritin 142.  She has negative BCR/ABL.    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  This mutation is seen in 90-95% of P Vera patients and 50-60% of ET or PMF pts.   Plt count is 470,000 on labs done 09/2018.  She will have repeat labs in 01/2019.  Will continue to monitor plt count for now and pt should continue ASA.    2.  Leucocytosis.  WBC mildly elevated at 12.7.  She has a normal differential.  Pt was on Prednisone 60 mg daily but reports she is no longer on Prednisone.  BCR/ABL negative.  Will repeat labs in 01/2019 for ongoing monitoring.    3.  Normocytic anemia.  HB 14. Ferritin WNL at 142.  She has negative SPEP.  No evidence of polycythemia as HCT is 45.   Will repeat labs in 01/2019.    4.  Pulmonary nodule.  Pt is followed by pulmonary.  CT chest done 07/13/2018 reported 2 new pulmonary nodules.  Pt had CT chest done 10/04/2018 that showed  IMPRESSION: 1. Decreased size of bilateral pulmonary nodules compared to previous studies dating back to 01/15/2018 and 07/02/2017, consistent with resolving inflammatory or infectious etiology. No acute findings. 2. Moderate emphysema. 3. Aortic and coronary artery atherosclerosis.  Pt should follow-up with Pulmonary as directed.   5.  Bruising.  Likely  due to steroid usage.  Platelet count is WNL at 470,000.  Will repeat labs in 01/2019.    6.  COPD.  Pt on oxygen via [place].  Follow-up with pulmonary as directed.    7.  HTN.  BP was 150/61.  Follow-up with PCP.    8.  Health maintenance.  Pt was given option of mammogram scheduling and referral to GI.   She does not desire either and reports she will discuss this further with PCP.      Follow Up Instructions:  Follow-up 01/2019 with labs.    I discussed the assessment and treatment plan with the patient. The patient was provided an opportunity to ask questions and all were answered. The patient agreed with the plan and demonstrated an understanding of the instructions.   The patient was advised to call back or seek an in-person evaluation if the symptoms worsen or if the condition fails to improve as anticipated.  I provided 15 minutes of non-face-to-face time during this encounter.   Zoila Shutter, MD

## 2018-12-30 ENCOUNTER — Telehealth: Payer: Self-pay | Admitting: Pulmonary Disease

## 2018-12-30 NOTE — Telephone Encounter (Signed)
Placard received and filled out Placed in TP's lookat folder for signature

## 2018-12-30 NOTE — Telephone Encounter (Signed)
Yes of course

## 2018-12-30 NOTE — Telephone Encounter (Signed)
Received a letter and handicap placard application in the mail from patient. She is requesting this form filled out and mailed back to her.   TP, would you be willing to sign the application since BQ is not in the office?

## 2018-12-30 NOTE — Telephone Encounter (Signed)
Form has been placed on Jessica's computer for TP's signature.

## 2018-12-31 NOTE — Telephone Encounter (Signed)
Received the form. Will finish completing and mail it back to the patient.

## 2019-01-12 ENCOUNTER — Other Ambulatory Visit: Payer: Self-pay

## 2019-01-12 ENCOUNTER — Ambulatory Visit (INDEPENDENT_AMBULATORY_CARE_PROVIDER_SITE_OTHER): Payer: Medicaid Other | Admitting: Primary Care

## 2019-01-12 ENCOUNTER — Encounter: Payer: Self-pay | Admitting: Primary Care

## 2019-01-12 VITALS — BP 144/80 | HR 76 | Temp 97.9°F | Ht 64.0 in | Wt 110.2 lb

## 2019-01-12 DIAGNOSIS — R0600 Dyspnea, unspecified: Secondary | ICD-10-CM

## 2019-01-12 DIAGNOSIS — J449 Chronic obstructive pulmonary disease, unspecified: Secondary | ICD-10-CM

## 2019-01-12 MED ORDER — TIOTROPIUM BROMIDE MONOHYDRATE 2.5 MCG/ACT IN AERS: 2.0000 | INHALATION_SPRAY | Freq: Every day | RESPIRATORY_TRACT | 6 refills | Status: DC

## 2019-01-12 MED ORDER — TIOTROPIUM BROMIDE MONOHYDRATE 2.5 MCG/ACT IN AERS: 2.0000 | INHALATION_SPRAY | Freq: Every day | RESPIRATORY_TRACT | 0 refills | Status: DC

## 2019-01-12 MED ORDER — BUDESONIDE-FORMOTEROL FUMARATE 160-4.5 MCG/ACT IN AERO: 2.0000 | INHALATION_SPRAY | Freq: Two times a day (BID) | RESPIRATORY_TRACT | 6 refills | Status: DC

## 2019-01-12 MED ORDER — BUDESONIDE-FORMOTEROL FUMARATE 160-4.5 MCG/ACT IN AERO: 2.0000 | INHALATION_SPRAY | Freq: Two times a day (BID) | RESPIRATORY_TRACT | 0 refills | Status: DC

## 2019-01-12 NOTE — Addendum Note (Signed)
Addended by: Karmen Stabs on: 01/12/2019 02:28 PM   Modules accepted: Orders

## 2019-01-12 NOTE — Assessment & Plan Note (Signed)
-   Stable interval; moderate dyspnea on exertion - Continue Symbicort 160 2bid - Add Spiriva respimat 2.5 mcg daily  - PRN combivent/duoneb q6 hours sob/wheezing - Refer to pulmonary rehab

## 2019-01-12 NOTE — Progress Notes (Signed)
Reviewed, agree 

## 2019-01-12 NOTE — Progress Notes (Signed)
@Patient  ID: Cheryl Haley, female    DOB: 07/20/1956, 62 y.o.   MRN: [id number]  Chief Complaint  Patient presents with  . Follow-up    SOB with exertion    Referring provider: [name], MD  HPI: 63 year old female, former smoker quit in 2018 (80 pack year hx). PMH significant for COPD with emphysema, chronic respiratory failure with hypoxia, pleural effusion, pulmonary nodules, hypertension. Patient of [name].  Hospitalized from 1/15- 09/19/18 after failing outpatient management for COPD flare. Received IV solu-medrol, Duonebs, oral antibiotics and pulmonary toiletry. Continued home oxygen on discharge. Started on Trelegy and given prednisone taper. BP on low-normal side, bisoprolol decreased to 2.5mg  daily and Cardizem to 60mg  twice daily.   09/28/2018- Hosp follow-up Patient presents today for a follow-up visit. Feeling better, pretty much back to baseline. Still has some wheezing. Using Duoneb twice daily and Combivent rescue inhaler once daily. Insurance wouldn't pay for Trelegy. O2 was 87% RA with ambulation. Has home oxygen with her today and O2 98% 2L. Has 6 days left of prednisone. Denies cough or sob.   01/12/2019 Patient presents today for regular 3 month follow-up. Breathing is baseline, experiences moderate amount of dyspnea on exertion. Currently only taking Symbicort 160. Wears 2L oxygen on exertion. States that she would consider pulmonary rehab, she does however live an hour away. Denies cough or significant wheezing.   No Known Allergies  Immunization History  Administered Date(s) Administered  . Influenza Split 05/31/2015  . Influenza,inj,Quad PF,6+ Mos 09/22/2013, 06/10/2016, 06/25/2017, 04/30/2018  . Pneumococcal Conjugate-13 04/17/2015  . Pneumococcal Polysaccharide-23 12/08/2016    Past Medical History:  Diagnosis Date  . Anxiety   . 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 COPD (chronic obstructive pulmonary disease) ([place])   . History of hiatal hernia   .  Hx of TIA (transient ischemic attack) and stroke   . Hypertension   . Hypothyroidism   . Pneumonia   . Thyroid disease     Tobacco History: Social History   Tobacco Use  Smoking Status Former Smoker  . Packs/day: 2.00  . Years: 40.00  . Pack years: 80.00  . Types: Cigarettes  . Last attempt to quit: 03/16/2017  . Years since quitting: 1.8  Smokeless Tobacco Never Used  Tobacco Comment       Counseling given: Not Answered Comment:     Outpatient Medications Prior to Visit  Medication Sig Dispense Refill  . aspirin EC 81 MG tablet Take 81 mg daily by mouth.     . diltiazem (CARDIZEM) 60 MG tablet Take 1 tablet (60 mg total) by mouth 2 (two) times daily. 60 tablet 0  . feeding supplement, ENSURE ENLIVE, (ENSURE ENLIVE) LIQD Take 237 mLs by mouth 3 (three) times daily between meals. 237 mL 12  . levothyroxine (SYNTHROID, LEVOTHROID) 100 MCG tablet Take 100 mcg daily before breakfast by mouth.    . losartan (COZAAR) 50 MG tablet Take 50 mg by mouth daily.    . metoprolol succinate (TOPROL-XL) 25 MG 24 hr tablet Take 25 mg by mouth daily.    . Multiple Vitamin (MULTIVITAMIN) tablet Take 1 tablet by mouth daily.    . rosuvastatin (CRESTOR) 5 MG tablet Take 5 mg by mouth daily.    Marland Kitchen Spacer/Aero-Holding Chambers (AEROCHAMBER MV) inhaler Use as instructed 1 each 0  . SYMBICORT 160-4.5 MCG/ACT inhaler Inhale 2 puffs into the lungs 2 (two) times daily. 1 Inhaler 11  . predniSONE (DELTASONE) 10 MG tablet Prednisone  60 mg po once daily for 3 days, then 40 mg po once daily for 3 days, then 30 mg po once daily for 3 days, then 20 mg po once daily for 3 days then 10 mg po once daily for 3 days and stop. 48 tablet 0  . bisoprolol (ZEBETA) 5 MG tablet Take 0.5 tablets (2.5 mg total) by mouth daily. (Patient not taking: Reported on 01/12/2019) 30 tablet 0  . Fluticasone-Umeclidin-Vilant (TRELEGY ELLIPTA) 100-62.5-25 MCG/INH AEPB Inhale 1 puff into the lungs daily. (Patient not taking: Reported on  01/12/2019) 60 each 11  . Ipratropium-Albuterol (COMBIVENT) 20-100 MCG/ACT AERS respimat Inhale 1 puff into the lungs every 6 (six) hours as needed for wheezing or shortness of breath. (Patient not taking: Reported on 01/12/2019) 1 Inhaler 0  . ipratropium-albuterol (DUONEB) 0.5-2.5 (3) MG/3ML SOLN Take 3 mLs by nebulization every 6 (six) hours as needed. (Patient not taking: Reported on 01/12/2019) 360 mL 5   No facility-administered medications prior to visit.     Review of Systems  Review of Systems  Constitutional: Negative.   HENT: Negative.   Respiratory: Positive for shortness of breath. Negative for cough and wheezing.   Cardiovascular: Negative.    Physical Exam  BP (!) 144/80 (BP Location: Left Arm, Cuff Size: Normal)   Pulse 76   Temp 97.9 F (36.6 C)   Ht 5\' 4"  (1.626 m)   Wt 110 lb 3.2 oz (50 kg)   SpO2 99%   BMI 18.92 kg/m  Physical Exam Constitutional:      Appearance: Normal appearance.  HENT:     Head: Normocephalic and atraumatic.     Mouth/Throat:     Mouth: Mucous membranes are moist.     Pharynx: Oropharynx is clear.  Cardiovascular:     Rate and Rhythm: Normal rate and regular rhythm.  Pulmonary:     Effort: Pulmonary effort is normal. No respiratory distress.     Breath sounds: No stridor. No rhonchi.     Comments: LS diminished, faint insp wheeze. Moderate DOE.  Musculoskeletal: Normal range of motion.  Skin:    General: Skin is warm and dry.  Neurological:     General: No focal deficit present.     Mental Status: She is alert and oriented to person, place, and time. Mental status is at baseline.  Psychiatric:        Mood and Affect: Mood normal.        Behavior: Behavior normal.        Thought Content: Thought content normal.        Judgment: Judgment normal.      Lab Results:  CBC    Component Value Date/Time   WBC 12.7 (H) 09/23/2018 1119   RBC 4.84 09/23/2018 1119   HGB 14.6 09/23/2018 1119   HCT 45.6 09/23/2018 1119   PLT 470  (H) 09/23/2018 1119   MCV 94.2 09/23/2018 1119   MCH 30.2 09/23/2018 1119   MCHC 32.0 09/23/2018 1119   RDW 14.5 09/23/2018 1119   LYMPHSABS 1.0 09/23/2018 1119   MONOABS 0.4 09/23/2018 1119   EOSABS 0.0 09/23/2018 1119   BASOSABS 0.0 09/23/2018 1119    BMET    Component Value Date/Time   NA 140 09/23/2018 1119   K 4.2 09/23/2018 1119   CL 97 (L) 09/23/2018 1119   CO2 32 09/23/2018 1119   GLUCOSE 98 09/23/2018 1119   BUN 17 09/23/2018 1119   CREATININE 0.78 09/23/2018 1119   CALCIUM 10.0 09/23/2018  1119   GFRNONAA >60 09/23/2018 1119   GFRAA >60 09/23/2018 1119    BNP No results found for: BNP  ProBNP    Component Value Date/Time   PROBNP 256.1 (H) 09/20/2013 1640    Imaging: No results found.   Assessment & Plan:   COPD (chronic obstructive pulmonary disease) with emphysema ([facility]) - Stable interval; moderate dyspnea on exertion - Continue Symbicort 160 2bid - Add Spiriva respimat 2.5 mcg daily  - PRN combivent/duoneb q6 hours sob/wheezing - Refer to pulmonary rehab   - FU in 4-6 week video/televisit; 3-4 months Dr. Tomasa Blase, NP 01/12/2019

## 2019-01-12 NOTE — Patient Instructions (Signed)
Continue Symbicort - take 2 puffs twice daily  Adding Spiriva - take 2 puffs once daily Use duoneb OR combivent inhaler every 6 hours as needed for breakthrough shortness of breath/wheezing   Continue 2L oxygen on exertion  Considering pulmonary rehab   Follow up televisit or video visit in 4-6 weeks; AND 4 months with [name]

## 2019-02-14 NOTE — Progress Notes (Signed)
Virtual Visit via Telephone Note  I connected with Cheryl Haley on 02/14/19 at 10:00 AM EDT by telephone and verified that I am speaking with the correct person using two identifiers.  Location: Patient: Home Provider: Office   I discussed the limitations, risks, security and privacy concerns of performing an evaluation and management service by telephone and the availability of in person appointments. I also discussed with the patient that there may be a patient responsible charge related to this service. The patient expressed understanding and agreed to proceed.  History of Present Illness: 63 year old female, former smoker quit in 2018 (80 pack year hx). PMH significant for COPD with emphysema, chronic respiratory failure with hypoxia, pleural effusion, pulmonary nodules, hypertension. Patient of [name].  Hospitalized from 1/15- 09/19/18 after failing outpatient management for COPD flare. Received IV solu-medrol, Duonebs, oral antibiotics and pulmonary toiletry. Continued home oxygen on discharge. Started on Trelegy and given prednisone taper. BP on low-normal side, bisoprolol decreased to 2.5mg  daily and Cardizem to 60mg  twice daily.   09/28/2018- Hosp follow-up Patient presents today for a follow-up visit. Feeling better, pretty much back to baseline. Still has some wheezing. Using Duoneb twice daily and Combivent rescue inhaler once daily. Insurance wouldn't pay for Trelegy. O2 was 87% RA with ambulation. Has home oxygen with her today and O2 98% 2L. Has 6 days left of prednisone. Denies cough or sob.   01/12/2019 - 3 month fu  Patient presents today for regular 3 month follow-up. Breathing is baseline, experiences moderate amount of dyspnea on exertion. Currently only taking Symbicort 160. Wears 2L oxygen on exertion. States that she would consider pulmonary rehab, she does however live an hour away. Denies cough or significant wheezing.  02/15/2019 - 4 week fu televisit  Patient  called today for 4 week follow-up. Recently added Spiriva to therapy. Doing a little better, feels addition of Spiriva helped some. Occasional cough and wheezing. She has used her rescue inhaler combivent a couple times with improvement. Due for repeat CT chest in May/June.   Observations/Objective:  - No shortness of breath, wheezing or cough noted during phone conversation  Assessment and Plan:  COPD with emphysema -Stable -Continue Symbicort 160 2bid -Continue Spriva 2.2mcg daily (if not covered send in RX for handihaler)  Chronic respiratory failure with hypoxia -Continue 2L oxygen on exertion   Pulmonary nodule - CT chest 10/04/18 showed decreased size bilateral pulmonary nodules, moderate emphysema  - Repeat CT chest in May/June   Follow Up Instructions:  -3 months with [name]   I discussed the assessment and treatment plan with the patient. The patient was provided an opportunity to ask questions and all were answered. The patient agreed with the plan and demonstrated an understanding of the instructions.   The patient was advised to call back or seek an in-person evaluation if the symptoms worsen or if the condition fails to improve as anticipated.  I provided 10 minutes of non-face-to-face time during this encounter.   Martyn Ehrich, NP

## 2019-02-15 ENCOUNTER — Ambulatory Visit (INDEPENDENT_AMBULATORY_CARE_PROVIDER_SITE_OTHER): Payer: Medicaid Other | Admitting: Primary Care

## 2019-02-15 ENCOUNTER — Encounter: Payer: Self-pay | Admitting: Primary Care

## 2019-02-15 ENCOUNTER — Other Ambulatory Visit: Payer: Self-pay

## 2019-02-15 DIAGNOSIS — J449 Chronic obstructive pulmonary disease, unspecified: Secondary | ICD-10-CM | POA: Diagnosis not present

## 2019-02-15 MED ORDER — SPIRIVA RESPIMAT 2.5 MCG/ACT IN AERS: 2.0000 | INHALATION_SPRAY | Freq: Every day | RESPIRATORY_TRACT | 6 refills | Status: DC

## 2019-02-15 NOTE — Patient Instructions (Addendum)
COPD with emphysema - Continue Symbicort two puffs twice daily - Continue Spiriva two puffs once daily (sent in prescription, let us know if not covered)  Chronic respiratory failure with hypoxia - Continue 2L oxygen on exertion   Pulmonary nodule - CT chest 10/04/18 showed decreased size bilateral pulmonary nodules, moderate emphysema  - Repeat CT chest in May/June   Follow-up - 3 months with [name] OR sooner if needed     Chronic Obstructive Pulmonary Disease Chronic obstructive pulmonary disease (COPD) is a long-term (chronic) lung problem. When you have COPD, it is hard for air to get in and out of your lungs. Usually the condition gets worse over time, and your lungs will never return to normal. There are things you can do to keep yourself as healthy as possible.  Your doctor may treat your condition with: ? Medicines. ? Oxygen. ? Lung surgery.  Your doctor may also recommend: ? Rehabilitation. This includes steps to make your body work better. It may involve a team of specialists. ? Quitting smoking, if you smoke. ? Exercise and changes to your diet. ? Comfort measures (palliative care). Follow these instructions at home: Medicines  Take over-the-counter and prescription medicines only as told by your doctor.  Talk to your doctor before taking any cough or allergy medicines. You may need to avoid medicines that cause your lungs to be dry. Lifestyle  If you smoke, stop. Smoking makes the problem worse. If you need help quitting, ask your doctor.  Avoid being around things that make your breathing worse. This may include smoke, chemicals, and fumes.  Stay active, but remember to rest as well.  Learn and use tips on how to relax.  Make sure you get enough sleep. Most adults need at least 7 hours of sleep every night.  Eat healthy foods. Eat smaller meals more often. Rest before meals. Controlled breathing Learn and use tips on how to control your breathing as  told by your doctor. Try:  Breathing in (inhaling) through your nose for 1 second. Then, pucker your lips and breath out (exhale) through your lips for 2 seconds.  Putting one hand on your belly (abdomen). Breathe in slowly through your nose for 1 second. Your hand on your belly should move out. Pucker your lips and breathe out slowly through your lips. Your hand on your belly should move in as you breathe out.  Controlled coughing Learn and use controlled coughing to clear mucus from your lungs. Follow these steps: 1. Lean your head a little forward. 2. Breathe in deeply. 3. Try to hold your breath for 3 seconds. 4. Keep your mouth slightly open while coughing 2 times. 5. Spit any mucus out into a tissue. 6. Rest and do the steps again 1 or 2 times as needed. General instructions  Make sure you get all the shots (vaccines) that your doctor recommends. Ask your doctor about a flu shot and a pneumonia shot.  Use oxygen therapy and pulmonary rehabilitation if told by your doctor. If you need home oxygen therapy, ask your doctor if you should buy a tool to measure your oxygen level (oximeter).  Make a COPD action plan with your doctor. This helps you to know what to do if you feel worse than usual.  Manage any other conditions you have as told by your doctor.  Avoid going outside when it is very hot, cold, or humid.  Avoid people who have a sickness you can catch (contagious).  Keep all  follow-up visits as told by your doctor. This is important. Contact a doctor if:  You cough up more mucus than usual.  There is a change in the color or thickness of the mucus.  It is harder to breathe than usual.  Your breathing is faster than usual.  You have trouble sleeping.  You need to use your medicines more often than usual.  You have trouble doing your normal activities such as getting dressed or walking around the house. Get help right away if:  You have shortness of breath while  resting.  You have shortness of breath that stops you from: ? Being able to talk. ? Doing normal activities.  Your chest hurts for longer than 5 minutes.  Your skin color is more blue than usual.  Your pulse oximeter shows that you have low oxygen for longer than 5 minutes.  You have a fever.  You feel too tired to breathe normally. Summary  Chronic obstructive pulmonary disease (COPD) is a long-term lung problem.  The way your lungs work will never return to normal. Usually the condition gets worse over time. There are things you can do to keep yourself as healthy as possible.  Take over-the-counter and prescription medicines only as told by your doctor.  If you smoke, stop. Smoking makes the problem worse. This information is not intended to replace advice given to you by your health care provider. Make sure you discuss any questions you have with your health care provider. Document Released: 02/04/2008 Document Revised: 09/22/2016 Document Reviewed: 09/22/2016 Elsevier Interactive Patient Education  2019 [employer].

## 2019-02-17 ENCOUNTER — Telehealth: Payer: Self-pay | Admitting: Primary Care

## 2019-02-17 MED ORDER — SPIRIVA HANDIHALER 18 MCG IN CAPS: 18.0000 ug | ORAL_CAPSULE | Freq: Every day | RESPIRATORY_TRACT | 2 refills | Status: DC

## 2019-02-17 NOTE — Telephone Encounter (Signed)
  Pt states she can not afford Spiriva. Pt advised to call her insurance and find out what medications are in same class as Spiriva and their cost to her. She is to call office back with the names of medications sp that provider my choice best option. Pt verbalized understanding.    Assessment and Plan:  COPD with emphysema -Stable -Continue Symbicort 160 2bid -Continue Spriva 2.56mcg daily (if not covered send in RX for handihaler)  Chronic respiratory failure with hypoxia -Continue 2L oxygen on exertion   Pulmonary nodule - CT chest 10/04/18 showed decreased size bilateral pulmonary nodules, moderate emphysema  - Repeat CT chest in May/June

## 2019-02-17 NOTE — Telephone Encounter (Signed)
Spiriva handihaler has been sent. Pt aware. Nothing further.

## 2019-02-17 NOTE — Telephone Encounter (Signed)
Can we call to see if Spiriva Handihaler is covered ?

## 2019-02-24 ENCOUNTER — Telehealth: Payer: Self-pay | Admitting: Internal Medicine

## 2019-02-24 NOTE — Telephone Encounter (Signed)
Called pt per 6/25 sch message to r/s - no answer - left message for patient to call back

## 2019-02-25 ENCOUNTER — Inpatient Hospital Stay: Payer: Medicaid Other | Attending: Internal Medicine | Admitting: Internal Medicine

## 2019-02-25 ENCOUNTER — Inpatient Hospital Stay: Payer: Medicaid Other

## 2019-03-14 ENCOUNTER — Telehealth: Payer: Self-pay | Admitting: Internal Medicine

## 2019-03-14 NOTE — Telephone Encounter (Signed)
Returned phone call regarding missed call regarding rescheduling June appointments, left a voicemail.

## 2019-03-25 ENCOUNTER — Inpatient Hospital Stay: Payer: Medicaid Other | Admitting: Internal Medicine

## 2019-04-01 ENCOUNTER — Telehealth: Payer: Self-pay | Admitting: Primary Care

## 2019-04-01 DIAGNOSIS — R911 Solitary pulmonary nodule: Secondary | ICD-10-CM

## 2019-04-01 NOTE — Telephone Encounter (Signed)
I do not see a current CT order for this patient.

## 2019-04-01 NOTE — Telephone Encounter (Signed)
CT order has expired  Ordered new CT

## 2019-04-01 NOTE — Telephone Encounter (Signed)
Assessment and Plan:  COPD with emphysema -Stable -Continue Symbicort 160 2bid -Continue Spriva 2.32mcg daily (if not covered send in RX for handihaler)  Chronic respiratory failure with hypoxia -Continue 2L oxygen on exertion   Pulmonary nodule - CT chest 10/04/18 showed decreased size bilateral pulmonary nodules, moderate emphysema  - Repeat CT chest in May/June   Follow Up Instructions:  -3 months with [name]   I discussed the assessment and treatment plan with the patient. The patient was provided an opportunity to ask questions and all were answered. The patient agreed with the plan and demonstrated an understanding of the instructions.  The patient was advised to call back or seek an in-person evaluation if the symptoms worsen or if the condition fails to improve as anticipated.   Called and spoke with pt letting her know that I did review pt's last OV with EW and saw that she did want pt to have CT repeated. Stated to pt that I would send this over to [facility] so they can call her to get her scheduled for the CT. Stated to pt after she is scheduled for the CT, we would need to get her in to the office for a f/u appt to discuss the results of the scan. Pt verbalized understanding. Routing to [facility] pool.

## 2019-04-08 ENCOUNTER — Telehealth: Payer: Self-pay | Admitting: Hematology

## 2019-04-08 NOTE — Telephone Encounter (Signed)
[facility] transfer to [employer]. Confirmed 8/26 lab/fu with patient. Per Dr. Maylon Peppers add lab to f/u.

## 2019-04-13 ENCOUNTER — Telehealth: Payer: Self-pay | Admitting: Primary Care

## 2019-04-13 NOTE — Telephone Encounter (Signed)
[facility] can you guys help with this? Thanks

## 2019-04-13 NOTE — Telephone Encounter (Signed)
I gave this peer to peer to beth to do for this pt I dont think it has been done yet Joellen [place]

## 2019-04-13 NOTE — Telephone Encounter (Signed)
I have called and set up for tomorrow peer to peer.

## 2019-04-13 NOTE — Telephone Encounter (Signed)
Nothing further needed. Peer to Peer scheduled.

## 2019-04-13 NOTE — Telephone Encounter (Signed)
Routed to Derl Barrow, NP for review.  Beth can you update [place] on this? thanks

## 2019-04-14 ENCOUNTER — Telehealth: Payer: Self-pay | Admitting: Primary Care

## 2019-04-14 DIAGNOSIS — J449 Chronic obstructive pulmonary disease, unspecified: Secondary | ICD-10-CM

## 2019-04-14 DIAGNOSIS — R918 Other nonspecific abnormal finding of lung field: Secondary | ICD-10-CM

## 2019-04-14 NOTE — Telephone Encounter (Signed)
Chart reviewed. Initial CT chest in November showed two new nodules right upper and lower lobes. Repeat scan in February showed decreasing size bilateral pulmonary nodules consistent with resolving inflammatory or infectious etiology. Recommendations if stable repeat in 12-18 months. Plan check CXR to ensure no changes.   Ordering CT wo contrast for November 2020.

## 2019-04-14 NOTE — Telephone Encounter (Signed)
Per Derl Barrow, NP CT scan has been ordered to be repeated in November 2020.

## 2019-04-18 ENCOUNTER — Ambulatory Visit (INDEPENDENT_AMBULATORY_CARE_PROVIDER_SITE_OTHER): Payer: Medicaid Other

## 2019-04-18 DIAGNOSIS — J449 Chronic obstructive pulmonary disease, unspecified: Secondary | ICD-10-CM | POA: Diagnosis not present

## 2019-04-18 NOTE — Progress Notes (Signed)
Please let patient know CXR still showed those two pulmonary nodules in right lung. I am going to try and re-order CT chest wo contrast. If she develops any acute symptoms such as productive cough or sob please make her a televisit.

## 2019-04-27 ENCOUNTER — Inpatient Hospital Stay: Payer: Medicaid Other | Admitting: Hematology

## 2019-04-27 ENCOUNTER — Inpatient Hospital Stay: Payer: Medicaid Other

## 2019-04-27 NOTE — Progress Notes (Deleted)
[facility] OFFICE PROGRESS NOTE  Patient Care Team: [name], MD as PCP - General (Family Medicine)  HEME/ONC OVERVIEW: 1. 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, JAK2+  -I reviewed the diagnosis and treatment options for ET, including the risk stratification -Ideally, the patient would need bone marrow biopsy to confirm the proliferation involving the megakaryocytes, but given her severe COPD, she would have a difficult time tolerating the procedure -Furthermore, in the absence of any concurrent polycythemia, PV is less likely, and therefore bone marrow biopsy would unlikely change the diagnosis -Therefore, after lengthy discussions, the patient elected to defer bone marrow biopsy at this time  -Based on the age and the presence of JAK2 mutation, she is considered high risk for thrombosis and would benefit from cytoreductive therapy with Hydrea -We discussed the rationale for, as well as some of the potential side effects of Hydrea -After lengthy discussions, patient agreed to start the medication -In light of the relatively mild thrombocytosis, I will start her Hydrea at 516m daily and uptitrate as needed  No orders of the defined types were placed in this encounter.   All questions were answered. The patient knows to call the clinic with any problems, questions or concerns. No barriers to learning was detected.  A total of more than {CHL ONC TIME VISIT - [id number]}were spent face-to-face with the patient during this encounter and over half of that time was spent on counseling and coordination of care as outlined above.   YTish Men MD 04/27/2019 9:03 AM  CHIEF COMPLAINT: "I am here for *** "  INTERVAL  HISTORY:   SUMMARY OF ONCOLOGIC HISTORY: Oncology History   No history exists.    REVIEW OF SYSTEMS:   Constitutional: ( - ) fevers, ( - )  chills , ( - ) night sweats Eyes: ( - ) blurriness of vision, ( - ) double vision, ( - ) watery eyes Ears, nose, mouth, throat, and face: ( - ) mucositis, ( - ) sore throat Respiratory: ( - ) cough, ( - ) dyspnea, ( - ) wheezes Cardiovascular: ( - ) palpitation, ( - ) chest discomfort, ( - ) lower extremity swelling Gastrointestinal:  ( - ) nausea, ( - ) heartburn, ( - ) change in bowel habits Skin: ( - ) abnormal skin rashes Lymphatics: ( - ) new lymphadenopathy, ( - ) easy bruising Neurological: ( - ) numbness, ( - ) tingling, ( - ) new weaknesses Behavioral/Psych: ( - ) mood change, ( - ) new changes  All other systems were reviewed with the patient and are negative.  I have reviewed the past medical history, past surgical history, social history and family history with the patient and they are unchanged from previous note.  ALLERGIES:  has No Known Allergies.  MEDICATIONS:  Current Outpatient Medications  Medication Sig Dispense Refill  . aspirin EC 81 MG tablet Take 81 mg daily by mouth.     . bisoprolol (ZEBETA) 5 MG tablet Take 0.5 tablets (2.5 mg total) by mouth daily. 30 tablet 0  . budesonide-formoterol (SYMBICORT) 160-4.5 MCG/ACT inhaler Inhale 2 puffs into the lungs 2 (two) times daily. 1 Inhaler 0  . budesonide-formoterol (SYMBICORT) 160-4.5 MCG/ACT inhaler Inhale 2 puffs into the lungs 2 (two) times  daily. 1 Inhaler 6  . diltiazem (CARDIZEM) 60 MG tablet Take 1 tablet (60 mg total) by mouth 2 (two) times daily. 60 tablet 0  . feeding supplement, ENSURE ENLIVE, (ENSURE ENLIVE) LIQD Take 237 mLs by mouth 3 (three) times daily between meals. 237 mL 12  . levothyroxine (SYNTHROID, LEVOTHROID) 100 MCG tablet Take 100 mcg daily before breakfast by mouth.    . losartan (COZAAR) 50 MG tablet Take 50 mg by mouth daily.    . metoprolol  succinate (TOPROL-XL) 25 MG 24 hr tablet Take 25 mg by mouth daily.    . Multiple Vitamin (MULTIVITAMIN) tablet Take 1 tablet by mouth daily.    . rosuvastatin (CRESTOR) 5 MG tablet Take 5 mg by mouth daily.    Marland Kitchen Spacer/Aero-Holding Chambers (AEROCHAMBER MV) inhaler Use as instructed 1 each 0  . SYMBICORT 160-4.5 MCG/ACT inhaler Inhale 2 puffs into the lungs 2 (two) times daily. 1 Inhaler 11  . tiotropium (SPIRIVA HANDIHALER) 18 MCG inhalation capsule Place 1 capsule (18 mcg total) into inhaler and inhale daily. 30 capsule 2   No current facility-administered medications for this visit.     PHYSICAL EXAMINATION: ECOG PERFORMANCE STATUS: {CHL ONC ECOG [phone]}  There were no vitals filed for this visit. There is no height or weight on file to calculate BMI.  There were no vitals filed for this visit.  GENERAL: alert, no distress and comfortable SKIN: skin color, texture, turgor are normal, no rashes or significant lesions EYES: conjunctiva are pink and non-injected, sclera clear OROPHARYNX: no exudate, no erythema; lips, buccal mucosa, and tongue normal  NECK: supple, non-tender LYMPH:  no palpable lymphadenopathy in the cervical LUNGS: clear to auscultation with normal breathing effort HEART: regular rate & rhythm and no murmurs and no lower extremity edema ABDOMEN: soft, non-tender, non-distended, normal bowel sounds Musculoskeletal: no cyanosis of digits and no clubbing  PSYCH: alert & oriented x 3, fluent speech NEURO: no focal motor/sensory deficits  LABORATORY DATA:  I have reviewed the data as listed    Component Value Date/Time   NA 140 09/23/2018 1119   K 4.2 09/23/2018 1119   CL 97 (L) 09/23/2018 1119   CO2 32 09/23/2018 1119   GLUCOSE 98 09/23/2018 1119   BUN 17 09/23/2018 1119   CREATININE 0.78 09/23/2018 1119   CALCIUM 10.0 09/23/2018 1119   PROT 8.0 09/23/2018 1119   ALBUMIN 4.3 09/23/2018 1119   AST 21 09/23/2018 1119   ALT 31 09/23/2018 1119    ALKPHOS 60 09/23/2018 1119   BILITOT 0.5 09/23/2018 1119   GFRNONAA >60 09/23/2018 1119   GFRAA >60 09/23/2018 1119    No results found for: SPEP, UPEP  Lab Results  Component Value Date   WBC 12.7 (H) 09/23/2018   NEUTROABS 11.2 (H) 09/23/2018   HGB 14.6 09/23/2018   HCT 45.6 09/23/2018   MCV 94.2 09/23/2018   PLT 470 (H) 09/23/2018      Chemistry      Component Value Date/Time   NA 140 09/23/2018 1119   K 4.2 09/23/2018 1119   CL 97 (L) 09/23/2018 1119   CO2 32 09/23/2018 1119   BUN 17 09/23/2018 1119   CREATININE 0.78 09/23/2018 1119      Component Value Date/Time   CALCIUM 10.0 09/23/2018 1119   ALKPHOS 60 09/23/2018 1119   AST 21 09/23/2018 1119   ALT 31 09/23/2018 1119   BILITOT 0.5 09/23/2018 1119       RADIOGRAPHIC STUDIES: I have personally  reviewed the radiological images as listed below and agreed with the findings in the report. Dg Chest 2 View  Result Date: 04/18/2019 CLINICAL DATA:  Cough, pulmonary nodule EXAM: CHEST - 2 VIEW COMPARISON:  Chest radiographs, 09/15/2018, CT chest, 10/04/2018 FINDINGS: The heart size and mediastinal contours are within normal limits. Pulmonary hyperinflation and emphysema. There is an irregular, spiculated appearing pulmonary nodule of the right upper lobe, which is increased in conspicuity when compared to prior radiographs and without a clear correlate on prior CT. There is an additional irregular, nodular opacity of the inferolateral right upper lobe, which was seen on prior radiographs. The visualized skeletal structures are unremarkable. IMPRESSION: 1.  Pulmonary hyperinflation and emphysema. 2. There is an irregular, spiculated appearing pulmonary nodule of the right upper lobe, which is increased in conspicuity when compared to prior radiographs and without a clear correlate on prior CT. There is an additional irregular, nodular opacity of the inferolateral right upper lobe, which was seen on prior radiographs. Recommend  CT to further evaluate pulmonary nodules. Electronically Signed   By: Eddie Candle M.D.   On: 04/18/2019 10:51

## 2019-04-29 ENCOUNTER — Inpatient Hospital Stay: Admission: RE | Admit: 2019-04-29 | Payer: Medicaid Other | Source: Ambulatory Visit

## 2019-04-29 ENCOUNTER — Telehealth: Payer: Self-pay | Admitting: Hematology

## 2019-04-29 NOTE — Telephone Encounter (Signed)
I called and LMVM for patient w/ date/time/location & phone number of appointments.  I asked her to call me back to confirm these appointments per 8/28 staff message

## 2019-05-03 ENCOUNTER — Telehealth: Payer: Self-pay | Admitting: Hematology

## 2019-05-03 NOTE — Telephone Encounter (Signed)
I called and left a message for patient to call me back to verify whether or not she wants to reschedule her missed appointments w/ Dr Maylon Peppers

## 2019-05-16 ENCOUNTER — Other Ambulatory Visit: Payer: Medicaid Other

## 2019-05-16 ENCOUNTER — Ambulatory Visit: Payer: Medicaid Other | Admitting: Hematology

## 2019-05-17 ENCOUNTER — Other Ambulatory Visit: Payer: Self-pay | Admitting: Adult Health

## 2019-06-01 ENCOUNTER — Other Ambulatory Visit: Payer: Self-pay

## 2019-06-01 ENCOUNTER — Ambulatory Visit: Payer: Medicaid Other | Admitting: Primary Care

## 2019-06-01 ENCOUNTER — Encounter: Payer: Self-pay | Admitting: Primary Care

## 2019-06-01 DIAGNOSIS — R918 Other nonspecific abnormal finding of lung field: Secondary | ICD-10-CM | POA: Diagnosis not present

## 2019-06-01 DIAGNOSIS — Z23 Encounter for immunization: Secondary | ICD-10-CM | POA: Diagnosis not present

## 2019-06-01 DIAGNOSIS — J9611 Chronic respiratory failure with hypoxia: Secondary | ICD-10-CM

## 2019-06-01 MED ORDER — BUDESONIDE-FORMOTEROL FUMARATE 160-4.5 MCG/ACT IN AERO: 2.0000 | INHALATION_SPRAY | Freq: Two times a day (BID) | RESPIRATORY_TRACT | 0 refills | Status: DC

## 2019-06-01 NOTE — Assessment & Plan Note (Signed)
-   Continue 2L oxygen on exertion and at night

## 2019-06-01 NOTE — Assessment & Plan Note (Addendum)
-   Stable interval, no recent exacerbations  - Continue Symbicort 160 two puffs twice daily (samples given) - Continue Spiriva HandiHaler two puffs once daily - Duoneb every 6 hours as needed for shortness of breath/wheezing  - Stay as active as possible - Instructed patient to notify us if she runs out of her inhalers  - Received influenza vaccine today - FU in 3 months with new pulmonary provider

## 2019-06-01 NOTE — Patient Instructions (Addendum)
It was a pleasure seeing you today Cheryl Haley, I am glad you are doing well   Office treatment: Given influenza vaccine today   Recommendations: Continue Symbicort - 2 puffs twice daily  Continue Spiriva- 2 puffs once daily Use rescue inhaler/nebulizer every 6 hours as needed for shortness of breath/wheezing  Stay as active a possible Let us know if you run out of your inhalers   Orders: Due for CT chest in November 2020   Follow-up: 3 months with new pulmonary provider (previous [facility] patient)    COPD and Physical Activity Chronic obstructive pulmonary disease (COPD) is a long-term (chronic) condition that affects the lungs. COPD is a general term that can be used to describe many different lung problems that cause lung swelling (inflammation) and limit airflow, including chronic bronchitis and emphysema. The main symptom of COPD is shortness of breath, which makes it harder to do even simple tasks. This can also make it harder to exercise and be active. Talk with your health care provider about treatments to help you breathe better and actions you can take to prevent breathing problems during physical activity. What are the benefits of exercising with COPD? Exercising regularly is an important part of a healthy lifestyle. You can still exercise and do physical activities even though you have COPD. Exercise and physical activity improve your shortness of breath by increasing blood flow (circulation). This causes your heart to pump more oxygen through your body. Moderate exercise can improve your:  Oxygen use.  Energy level.  Shortness of breath.  Strength in your breathing muscles.  Heart health.  Sleep.  Self-esteem and feelings of self-worth.  Depression, stress, and anxiety levels. Exercise can benefit everyone with COPD. The severity of your disease may affect how hard you can exercise, especially at first, but everyone can benefit. Talk with your health care provider  about how much exercise is safe for you, and which activities and exercises are safe for you. What actions can I take to prevent breathing problems during physical activity?  Sign up for a pulmonary rehabilitation program. This type of program may include: ? Education about lung diseases. ? Exercise classes that teach you how to exercise and be more active while improving your breathing. This usually involves:  Exercise using your lower extremities, such as a stationary bicycle.  About 30 minutes of exercise, 2 to 5 times per week, for 6 to 12 weeks  Strength training, such as push ups or leg lifts. ? Nutrition education. ? Group classes in which you can talk with others who also have COPD and learn ways to manage stress.  If you use an oxygen tank, you should use it while you exercise. Work with your health care provider to adjust your oxygen for your physical activity. Your resting flow rate is different from your flow rate during physical activity.  While you are exercising: ? Take slow breaths. ? Pace yourself and do not try to go too fast. ? Purse your lips while breathing out. Pursing your lips is similar to a kissing or whistling position. ? If doing exercise that uses a quick burst of effort, such as weight lifting:  Breathe in before starting the exercise.  Breathe out during the hardest part of the exercise (such as raising the weights). Where to find support You can find support for exercising with COPD from:  Your health care provider.  A pulmonary rehabilitation program.  Your local health department or community health programs.  Support groups, online  or in-person. Your health care provider may be able to recommend support groups. Where to find more information You can find more information about exercising with COPD from:  American Lung Association: ClassInsider.se.  COPD Foundation: https://www.rivera.net/. Contact a health care provider if:  Your symptoms get  worse.  You have chest pain.  You have nausea.  You have a fever.  You have trouble talking or catching your breath.  You want to start a new exercise program or a new activity. Summary  COPD is a general term that can be used to describe many different lung problems that cause lung swelling (inflammation) and limit airflow. This includes chronic bronchitis and emphysema.  Exercise and physical activity improve your shortness of breath by increasing blood flow (circulation). This causes your heart to provide more oxygen to your body.  Contact your health care provider before starting any exercise program or new activity. Ask your health care provider what exercises and activities are safe for you. This information is not intended to replace advice given to you by your health care provider. Make sure you discuss any questions you have with your health care provider. Document Released: 09/10/2017 Document Revised: 12/08/2018 Document Reviewed: 09/10/2017 Elsevier Patient Education  [place].   Influenza Virus Vaccine (Flucelvax) What is this medicine? INFLUENZA VIRUS VACCINE (in floo EN zuh VAHY ruhs vak SEEN) helps to reduce the risk of getting influenza also known as the flu. The vaccine only helps protect you against some strains of the flu. This medicine may be used for other purposes; ask your health care provider or pharmacist if you have questions. COMMON BRAND NAME(S): FLUCELVAX What should I tell my health care provider before I take this medicine? They need to know if you have any of these conditions:  bleeding disorder like hemophilia  fever or infection  Guillain-Barre syndrome or other neurological problems  immune system problems  infection with the human immunodeficiency virus (HIV) or AIDS  low blood platelet counts  multiple sclerosis  an unusual or allergic reaction to influenza virus vaccine, other medicines, foods, dyes or  preservatives  pregnant or trying to get pregnant  breast-feeding How should I use this medicine? This vaccine is for injection into a muscle. It is given by a health care professional. A copy of Vaccine Information Statements will be given before each vaccination. Read this sheet carefully each time. The sheet may change frequently. Talk to your pediatrician regarding the use of this medicine in children. Special care may be needed. Overdosage: If you think you've taken too much of this medicine contact a poison control center or emergency room at once. Overdosage: If you think you have taken too much of this medicine contact a poison control center or emergency room at once. NOTE: This medicine is only for you. Do not share this medicine with others. What if I miss a dose? This does not apply. What may interact with this medicine?  chemotherapy or radiation therapy  medicines that lower your immune system like etanercept, anakinra, infliximab, and adalimumab  medicines that treat or prevent blood clots like warfarin  phenytoin  steroid medicines like prednisone or cortisone  theophylline  vaccines This list may not describe all possible interactions. Give your health care provider a list of all the medicines, herbs, non-prescription drugs, or dietary supplements you use. Also tell them if you smoke, drink alcohol, or use illegal drugs. Some items may interact with your medicine. What should I watch for while using  this medicine? Report any side effects that do not go away within 3 days to your doctor or health care professional. Call your health care provider if any unusual symptoms occur within 6 weeks of receiving this vaccine. You may still catch the flu, but the illness is not usually as bad. You cannot get the flu from the vaccine. The vaccine will not protect against colds or other illnesses that may cause fever. The vaccine is needed every year. What side effects may I  notice from receiving this medicine? Side effects that you should report to your doctor or health care professional as soon as possible:  allergic reactions like skin rash, itching or hives, swelling of the face, lips, or tongue Side effects that usually do not require medical attention (Report these to your doctor or health care professional if they continue or are bothersome.):  fever  headache  muscle aches and pains  pain, tenderness, redness, or swelling at the injection site  tiredness This list may not describe all possible side effects. Call your doctor for medical advice about side effects. You may report side effects to FDA at 1-800-FDA-1088. Where should I keep my medicine? The vaccine will be given by a health care professional in a clinic, pharmacy, doctor's office, or other health care setting. You will not be given vaccine doses to store at home. NOTE: This sheet is a summary. It may not cover all possible information. If you have questions about this medicine, talk to your doctor, pharmacist, or health care provider.  2020 Elsevier/Gold Standard (2011-07-30 14:06:47)

## 2019-06-01 NOTE — Progress Notes (Signed)
@Patient  ID: Cheryl Haley, female    DOB: 03/14/1956, 63 y.o.   MRN: [id number]  Chief Complaint  Patient presents with  . Follow-up    Referring provider: [name], MD  HPI: 63 year old female, former smoker quit in 2018 (80 pack year hx). PMH significant for COPD with emphysema, chronic respiratory failure with hypoxia, pleural effusion, pulmonary nodules, hypertension. Patient of [name].  Hospitalized from 09/15/18 - 09/19/18 after failing outpatient management for COPD flare. Received IV solu-medrol, Duonebs, oral antibiotics and pulmonary toiletry. Continued home oxygen on discharge. Started on Trelegy and given prednisone taper. BP on low-normal side, bisoprolol decreased to 2.5mg  daily and Cardizem to 60mg  twice daily.   Previous [facility] pulmonary encounters: 09/28/2018- Hosp follow-up Patient presents today for a follow-up visit. Feeling better, pretty much back to baseline. Still has some wheezing. Using Duoneb twice daily and Combivent rescue inhaler once daily. Insurance wouldn't pay for Trelegy. O2 was 87% RA with ambulation. Has home oxygen with her today and O2 98% 2L. Has 6 days left of prednisone. Denies cough or sob.   01/12/2019 - 3 month fu  Patient presents today for regular 3 month follow-up. Breathing is baseline, experiences moderate amount of dyspnea on exertion. Currently only taking Symbicort 160. Wears 2L oxygen on exertion. States that she would consider pulmonary rehab, she does however live an hour away. Denies cough or significant wheezing.  02/15/2019 - 4 week fu televisit  Patient called today for 4 week follow-up. Recently added Spiriva to therapy. Doing a little better, feels addition of Spiriva helped some. Occasional cough and wheezing. She has used her rescue inhaler combivent a couple times with improvement. Due for repeat CT chest in May/June.   06/01/2019 Patient presents today for 3 month follow-up. She is doing well, no recent exacerbations  since last visit. States that she still gets out of breath but is able to walk a little more. Wearing 2L oxygen on exertion. After showering O2 is 84-86% off oxygen but recovers quickly. Reports she is running out of Symbicort 2 weeks early, verified she is taking as prescribed two puffs twice daily. Sister has had the same issue. No issues with Spiriva HandiHaler. Denies fever, cough, wheezing. Due for CT chest in November 2020 (see telephone note).    No Known Allergies  Immunization History  Administered Date(s) Administered  . Influenza Split 05/31/2015  . Influenza,inj,Quad PF,6+ Mos 09/22/2013, 06/10/2016, 06/25/2017, 04/30/2018, 06/01/2019  . Pneumococcal Conjugate-13 04/17/2015  . Pneumococcal Polysaccharide-23 12/08/2016    Past Medical History:  Diagnosis Date  . Anxiety   . 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 COPD (chronic obstructive pulmonary disease) ([place])   . History of hiatal hernia   . Hx of TIA (transient ischemic attack) and stroke   . Hypertension   . Hypothyroidism   . Pneumonia   . Thyroid disease     Tobacco History: Social History   Tobacco Use  Smoking Status Former Smoker  . Packs/day: 2.00  . Years: 40.00  . Pack years: 80.00  . Types: Cigarettes  . Quit date: 03/16/2017  . Years since quitting: 2.2  Smokeless Tobacco Never Used  Tobacco Comment       Counseling given: Not Answered Comment:     Outpatient Medications Prior to Visit  Medication Sig Dispense Refill  . aspirin EC 81 MG tablet Take 81 mg daily by mouth.     . bisoprolol (ZEBETA) 5 MG tablet Take 0.5 tablets (2.5 mg  total) by mouth daily. 30 tablet 0  . budesonide-formoterol (SYMBICORT) 160-4.5 MCG/ACT inhaler Inhale 2 puffs into the lungs 2 (two) times daily. 1 Inhaler 6  . diltiazem (CARDIZEM) 60 MG tablet Take 1 tablet (60 mg total) by mouth 2 (two) times daily. 60 tablet 0  . feeding supplement, ENSURE ENLIVE, (ENSURE ENLIVE) LIQD Take 237 mLs by mouth 3 (three) times daily between  meals. 237 mL 12  . levothyroxine (SYNTHROID, LEVOTHROID) 100 MCG tablet Take 100 mcg daily before breakfast by mouth.    . losartan (COZAAR) 50 MG tablet Take 50 mg by mouth daily.    . metoprolol succinate (TOPROL-XL) 25 MG 24 hr tablet Take 25 mg by mouth daily.    . Multiple Vitamin (MULTIVITAMIN) tablet Take 1 tablet by mouth daily.    . rosuvastatin (CRESTOR) 5 MG tablet Take 5 mg by mouth daily.    Marland Kitchen Spacer/Aero-Holding Chambers (AEROCHAMBER MV) inhaler Use as instructed 1 each 0  . SPIRIVA HANDIHALER 18 MCG inhalation capsule PLACE 1 CAPSULE INTO INHALER AND INHALE ONCE DAILY 30 capsule 1  . SYMBICORT 160-4.5 MCG/ACT inhaler Inhale 2 puffs into the lungs 2 (two) times daily. 1 Inhaler 11  . budesonide-formoterol (SYMBICORT) 160-4.5 MCG/ACT inhaler Inhale 2 puffs into the lungs 2 (two) times daily. 1 Inhaler 0   No facility-administered medications prior to visit.    Review of Systems  Review of Systems  Constitutional: Negative.   HENT: Negative.   Respiratory: Positive for shortness of breath. Negative for cough and wheezing.   Cardiovascular: Negative.    Physical Exam  BP (!) 144/64 (BP Location: Left Arm, Cuff Size: Normal)   Pulse 91   Temp 97.7 F (36.5 C) (Oral)   Ht 5\' 4"  (1.626 m)   Wt 113 lb 6.4 oz (51.4 kg)   SpO2 95%   BMI 19.47 kg/m  Physical Exam Constitutional:      General: She is not in acute distress.    Appearance: Normal appearance. She is not ill-appearing or toxic-appearing.  HENT:     Head: Normocephalic and atraumatic.     Mouth/Throat:     Mouth: Mucous membranes are moist.     Pharynx: Oropharynx is clear.  Neck:     Musculoskeletal: Normal range of motion and neck supple.  Cardiovascular:     Rate and Rhythm: Normal rate and regular rhythm.  Pulmonary:     Effort: Pulmonary effort is normal. No respiratory distress.     Breath sounds: No stridor. Wheezing present. No rhonchi.     Comments: Mostly clear, faint exp wheeze to anterior  chest wall  Musculoskeletal: Normal range of motion.  Skin:    General: Skin is warm and dry.  Neurological:     General: No focal deficit present.     Mental Status: She is alert and oriented to person, place, and time. Mental status is at baseline.  Psychiatric:        Mood and Affect: Mood normal.        Behavior: Behavior normal.        Thought Content: Thought content normal.        Judgment: Judgment normal.      Lab Results:  CBC    Component Value Date/Time   WBC 12.7 (H) 09/23/2018 1119   RBC 4.84 09/23/2018 1119   HGB 14.6 09/23/2018 1119   HCT 45.6 09/23/2018 1119   PLT 470 (H) 09/23/2018 1119   MCV 94.2 09/23/2018 1119   MCH  30.2 09/23/2018 1119   MCHC 32.0 09/23/2018 1119   RDW 14.5 09/23/2018 1119   LYMPHSABS 1.0 09/23/2018 1119   MONOABS 0.4 09/23/2018 1119   EOSABS 0.0 09/23/2018 1119   BASOSABS 0.0 09/23/2018 1119    BMET    Component Value Date/Time   NA 140 09/23/2018 1119   K 4.2 09/23/2018 1119   CL 97 (L) 09/23/2018 1119   CO2 32 09/23/2018 1119   GLUCOSE 98 09/23/2018 1119   BUN 17 09/23/2018 1119   CREATININE 0.78 09/23/2018 1119   CALCIUM 10.0 09/23/2018 1119   GFRNONAA >60 09/23/2018 1119   GFRAA >60 09/23/2018 1119    BNP No results found for: BNP  ProBNP    Component Value Date/Time   PROBNP 256.1 (H) 09/20/2013 1640    Imaging: No results found.   Assessment & Plan:   COPD (chronic obstructive pulmonary disease) with emphysema ([facility]) - Stable interval, no recent exacerbations  - Continue Symbicort 160 two puffs twice daily  - Continue Spiriva HandiHaler two puffs once daily - Duoneb every 6 hours as needed for shortness of breath/wheezing  - Stay as active as possible - Instructed patient to notify us if she runs out of her inhalers  - Received influenza vaccine today - FU in 3 months with new pulmonary provider    Chronic respiratory failure with hypoxia ([place]) - Continue 2L oxygen on exertion and at night    Pulmonary nodules - Initial CT chest in November showed two new nodules right upper and lower lobes. Repeat scan in February showed decreasing size bilateral pulmonary nodules consistent with resolving inflammatory or infectious etiology. Recommendations if stable repeat in 12-18 months. Do for repeat CT chest in November 2020  - CXR on 04/18/19 to monitor nodules showed an irregular, spiculated appearing pulmonary nodule of the RUL, which is increased compared to prior radiographs. There is an additional irregular, nodular opacity of the inferolateral right upper lobe, which was seen on prior radiographs. Recommend CT to further evaluate pulmonary nodules.  - Ordered for CT chest wo contrast which has not been done, needs follow-up     Martyn Ehrich, NP 06/01/2019

## 2019-06-01 NOTE — Assessment & Plan Note (Signed)
-   Initial CT chest in November showed two new nodules right upper and lower lobes. Repeat scan in February showed decreasing size bilateral pulmonary nodules consistent with resolving inflammatory or infectious etiology. Recommendations if stable repeat in 12-18 months. Do for repeat CT chest in November 2020  - CXR on 04/18/19 to monitor nodules showed an irregular, spiculated appearing pulmonary nodule of the RUL, which is increased compared to prior radiographs. There is an additional irregular, nodular opacity of the inferolateral right upper lobe, which was seen on prior radiographs. Recommend CT to further evaluate pulmonary nodules.  - Ordered for CT chest wo contrast which has not been done, needs follow-up

## 2019-06-02 NOTE — Progress Notes (Signed)
Reviewed, agree 

## 2019-06-22 ENCOUNTER — Other Ambulatory Visit: Payer: Self-pay | Admitting: Primary Care

## 2019-06-22 DIAGNOSIS — R911 Solitary pulmonary nodule: Secondary | ICD-10-CM

## 2019-06-23 ENCOUNTER — Inpatient Hospital Stay: Admission: RE | Admit: 2019-06-23 | Payer: Medicaid Other | Source: Ambulatory Visit

## 2019-06-28 ENCOUNTER — Other Ambulatory Visit: Payer: Medicaid Other

## 2019-06-28 ENCOUNTER — Ambulatory Visit
Admission: RE | Admit: 2019-06-28 | Discharge: 2019-06-28 | Disposition: A | Discharge status: Home / Self Care | Payer: Medicaid Other | Source: Ambulatory Visit | Attending: Primary Care | Admitting: Primary Care

## 2019-06-28 ENCOUNTER — Other Ambulatory Visit: Payer: Self-pay

## 2019-06-28 DIAGNOSIS — R911 Solitary pulmonary nodule: Secondary | ICD-10-CM

## 2019-06-29 NOTE — Progress Notes (Signed)
Please let patient know CT showed stable/reduce size in pulmonary nodules, consistent with fluctuating and resolving infection/inflammation. Severe emphysema and coronary artery disease (follow-up with pcp). Please refer to low cancer screen for annual follow-up to start 06/2020

## 2019-06-30 ENCOUNTER — Other Ambulatory Visit: Payer: Self-pay | Admitting: *Deleted

## 2019-06-30 DIAGNOSIS — R911 Solitary pulmonary nodule: Secondary | ICD-10-CM

## 2019-06-30 NOTE — Progress Notes (Signed)
Pt aware of CT results and plan to repeat in 1 year. Made aware to mention CAD to pcp. Nothing further needed. 2021 low dose ordered.

## 2019-07-12 ENCOUNTER — Other Ambulatory Visit: Payer: Self-pay | Admitting: Primary Care

## 2019-09-05 ENCOUNTER — Other Ambulatory Visit: Payer: Self-pay

## 2019-09-05 ENCOUNTER — Encounter: Payer: Self-pay | Admitting: Primary Care

## 2019-09-05 ENCOUNTER — Ambulatory Visit (INDEPENDENT_AMBULATORY_CARE_PROVIDER_SITE_OTHER): Payer: Medicare Other | Admitting: Primary Care

## 2019-09-05 VITALS — BP 138/62 | HR 85 | Temp 98.4°F | Ht 64.0 in | Wt 112.8 lb

## 2019-09-05 DIAGNOSIS — R918 Other nonspecific abnormal finding of lung field: Secondary | ICD-10-CM

## 2019-09-05 DIAGNOSIS — Z87891 Personal history of nicotine dependence: Secondary | ICD-10-CM | POA: Diagnosis not present

## 2019-09-05 DIAGNOSIS — J439 Emphysema, unspecified: Secondary | ICD-10-CM

## 2019-09-05 DIAGNOSIS — J9611 Chronic respiratory failure with hypoxia: Secondary | ICD-10-CM | POA: Diagnosis not present

## 2019-09-05 MED ORDER — TRELEGY ELLIPTA 100-62.5-25 MCG/INH IN AEPB: 1.0000 | INHALATION_SPRAY | Freq: Every day | RESPIRATORY_TRACT | 0 refills | Status: DC

## 2019-09-05 NOTE — Assessment & Plan Note (Signed)
-   Mostly stable interval, continues to experiences dyspnea on exertion - She is having difficulties with Symbicort inhaler - Recommending changing to TRELEGY 1 PUFF DAILY (needs prior auth started) - FU in 3 months with new [facility] pulmonary doctor (former [place])

## 2019-09-05 NOTE — Patient Instructions (Addendum)
Recommendations: Stop Symbicort and Spiriva Trial Trelegy 1 puff once daily; continue as needed albuterol rescue inhaler every 6 hours  Orders: [facility] oxygen ____3L__________________ Needs prior auth for trelegy  Referral: Lung cancer screening clinic- (would be due LDCT in October 2021)  Follow-up: 3 months with NEW pulmonary provider (previous [facility])

## 2019-09-05 NOTE — Assessment & Plan Note (Signed)
-   Qualifying walk performed today for [facility] - Needs 3L on ambulation to keep O2 >90%

## 2019-09-05 NOTE — Progress Notes (Signed)
@Patient  ID: Cheryl Haley, female    DOB: [birth year], 64 y.o.   MRN: [id number]  No chief complaint on file.   Referring provider: [name], MD  HPI: 64 year old female, former smoker quit in 2018 (80 pack year hx). PMH significant for COPD with emphysema, chronic respiratory failure with hypoxia, pleural effusion, pulmonary nodules, hypertension. Former patient of [name].  Hospitalized from 09/15/18 - 09/19/18 after failing outpatient management for COPD flare. Received IV solu-medrol, Duonebs, oral antibiotics and pulmonary toiletry. Continued home oxygen on discharge. Started on Trelegy and given prednisone taper. BP on low-normal side, bisoprolol decreased to 2.5mg  daily and Cardizem to 60mg  twice daily.   Previous [facility] pulmonary encounters: 09/28/2018- Hosp follow-up Patient presents today for a follow-up visit. Feeling better, pretty much back to baseline. Still has some wheezing. Using Duoneb twice daily and Combivent rescue inhaler once daily. Insurance wouldn't pay for Trelegy. O2 was 87% RA with ambulation. Has home oxygen with her today and O2 98% 2L. Has 6 days left of prednisone. Denies cough or sob.   01/12/2019 - 3 month fu  Patient presents today for regular 3 month follow-up. Breathing is baseline, experiences moderate amount of dyspnea on exertion. Currently only taking Symbicort 160. Wears 2L oxygen on exertion. States that she would consider pulmonary rehab, she does however live an hour away. Denies cough or significant wheezing.  02/15/2019 - 4 week fu televisit  Patient called today for 4 week follow-up. Recently added Spiriva to therapy. Doing a little better, feels addition of Spiriva helped some. Occasional cough and wheezing. She has used her rescue inhaler combivent a couple times with improvement. Due for repeat CT chest in May/June.   06/01/2019 Patient presents today for 3 month follow-up. She is doing well, no recent exacerbations since last visit.  States that she still gets out of breath but is able to walk a little more. Wearing 2L oxygen on exertion. After showering O2 is 84-86% off oxygen but recovers quickly. Reports she is running out of Symbicort 2 weeks early, verified she is taking as prescribed two puffs twice daily. Sister has had the same issue. No issues with Spiriva HandiHaler. Denies fever, cough, wheezing. Due for CT chest in November 2020 (see telephone note).   09/05/2019 Patient presents today for 4 months follow-up. Overall she is feeling well. Continues to state that she is running out of her Symbicort. She denies using it more than prescribed. Feels her breathing was better with Trelegy but it was not covered. She gets out of breath walking. No significantly cough or wheezing. Continues to wear 2L oxygen at baseline, she is interested in qualifying for Inogen.    Imaging: 06/28/19 CT chest - Multiple bilateral pulmonary nodules are stable or reduced in size compared to prior examination, with a history of fluctuating and resolving pulmonary nodules seen on prior examinations. These are generally consistent with benign sequelae of infection or inflammation. Consider ongoing annual low-dose CT lung cancer screening. Severe emphysema. Coronary artery disease.  No Known Allergies  Immunization History  Administered Date(s) Administered  . Influenza Split 05/31/2015  . Influenza,inj,Quad PF,6+ Mos 09/22/2013, 06/10/2016, 06/25/2017, 04/30/2018, 06/01/2019  . Pneumococcal Conjugate-13 04/17/2015  . Pneumococcal Polysaccharide-23 12/08/2016    Past Medical History:  Diagnosis Date  . Anxiety   . 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 COPD (chronic obstructive pulmonary disease) ([place])   . History of hiatal hernia   . Hx of TIA (transient ischemic attack) and stroke   .  Hypertension   . Hypothyroidism   . Pneumonia   . Thyroid disease     Tobacco History: Social History   Tobacco Use  Smoking Status Former Smoker  .  Packs/day: 2.00  . Years: 40.00  . Pack years: 80.00  . Types: Cigarettes  . Quit date: 03/16/2017  . Years since quitting: 2.4  Smokeless Tobacco Never Used  Tobacco Comment       Counseling given: Not Answered Comment:     Outpatient Medications Prior to Visit  Medication Sig Dispense Refill  . aspirin EC 81 MG tablet Take 81 mg daily by mouth.     . bisoprolol (ZEBETA) 5 MG tablet Take 0.5 tablets (2.5 mg total) by mouth daily. 30 tablet 0  . budesonide-formoterol (SYMBICORT) 160-4.5 MCG/ACT inhaler Inhale 2 puffs into the lungs 2 (two) times daily. 1 Inhaler 6  . budesonide-formoterol (SYMBICORT) 160-4.5 MCG/ACT inhaler Inhale 2 puffs into the lungs 2 (two) times daily. 2 Inhaler 0  . diltiazem (CARDIZEM) 60 MG tablet Take 1 tablet (60 mg total) by mouth 2 (two) times daily. 60 tablet 0  . feeding supplement, ENSURE ENLIVE, (ENSURE ENLIVE) LIQD Take 237 mLs by mouth 3 (three) times daily between meals. 237 mL 12  . levothyroxine (SYNTHROID, LEVOTHROID) 100 MCG tablet Take 100 mcg daily before breakfast by mouth.    . losartan (COZAAR) 50 MG tablet Take 50 mg by mouth daily.    . metoprolol succinate (TOPROL-XL) 25 MG 24 hr tablet Take 25 mg by mouth daily.    . Multiple Vitamin (MULTIVITAMIN) tablet Take 1 tablet by mouth daily.    . rosuvastatin (CRESTOR) 5 MG tablet Take 5 mg by mouth daily.    Marland Kitchen Spacer/Aero-Holding Chambers (AEROCHAMBER MV) inhaler Use as instructed 1 each 0  . SPIRIVA HANDIHALER 18 MCG inhalation capsule INHALE 1 PUFF BY MOUTH ONCE DAILY. PATIENT NEEDS OFFICE VISIT. 30 capsule 3  . SYMBICORT 160-4.5 MCG/ACT inhaler Inhale 2 puffs into the lungs 2 (two) times daily. 1 Inhaler 11   No facility-administered medications prior to visit.   Review of Systems  Review of Systems  Constitutional: Negative.   Respiratory: Positive for shortness of breath. Negative for cough and wheezing.   Cardiovascular: Negative.    Physical Exam  BP 138/62 (BP Location: Left  Arm, Cuff Size: Normal)   Pulse 85   Temp 98.4 F (36.9 C) (Oral)   Ht 5\' 4"  (1.626 m)   Wt 112 lb 12.8 oz (51.2 kg)   SpO2 94%   BMI 19.36 kg/m  Physical Exam Constitutional:      Appearance: Normal appearance.  Cardiovascular:     Rate and Rhythm: Normal rate and regular rhythm.  Pulmonary:     Effort: Pulmonary effort is normal. No respiratory distress.     Breath sounds: No stridor. Wheezing present. No rhonchi.  Musculoskeletal:        General: Normal range of motion.  Skin:    General: Skin is warm and dry.  Neurological:     General: No focal deficit present.     Mental Status: She is alert and oriented to person, place, and time. Mental status is at baseline.  Psychiatric:        Mood and Affect: Mood normal.        Behavior: Behavior normal.        Thought Content: Thought content normal.        Judgment: Judgment normal.      Lab Results:  CBC    Component Value Date/Time   WBC 12.7 (H) 09/23/2018 1119   RBC 4.84 09/23/2018 1119   HGB 14.6 09/23/2018 1119   HCT 45.6 09/23/2018 1119   PLT 470 (H) 09/23/2018 1119   MCV 94.2 09/23/2018 1119   MCH 30.2 09/23/2018 1119   MCHC 32.0 09/23/2018 1119   RDW 14.5 09/23/2018 1119   LYMPHSABS 1.0 09/23/2018 1119   MONOABS 0.4 09/23/2018 1119   EOSABS 0.0 09/23/2018 1119   BASOSABS 0.0 09/23/2018 1119    BMET    Component Value Date/Time   NA 140 09/23/2018 1119   K 4.2 09/23/2018 1119   CL 97 (L) 09/23/2018 1119   CO2 32 09/23/2018 1119   GLUCOSE 98 09/23/2018 1119   BUN 17 09/23/2018 1119   CREATININE 0.78 09/23/2018 1119   CALCIUM 10.0 09/23/2018 1119   GFRNONAA >60 09/23/2018 1119   GFRAA >60 09/23/2018 1119    BNP No results found for: BNP  ProBNP    Component Value Date/Time   PROBNP 256.1 (H) 09/20/2013 1640    Imaging: No results found.   Assessment & Plan:   COPD (chronic obstructive pulmonary disease) with emphysema ([place]) - Mostly stable interval, continues to experiences  dyspnea on exertion - She is having difficulties with Symbicort inhaler - Recommending changing to [place] 1 PUFF DAILY (needs prior auth started) - FU in 3 months with new [facility] pulmonary doctor (former [place])  Chronic respiratory failure with hypoxia ([place]) - Qualifying walk performed today for inogen - Needs 3L on ambulation to keep O2 >90%  Pulmonary nodules - Repeat CT chest in October 2020 showed multiple bilateral pulmonary nodules which are stable or reduced in size - Refer to lung cancer screening clinic, recommend annual follow-up with LDCT  Martyn Ehrich, NP 09/05/2019

## 2019-09-05 NOTE — Assessment & Plan Note (Signed)
-   Repeat CT chest in October 2020 showed multiple bilateral pulmonary nodules which are stable or reduced in size - Refer to lung cancer screening clinic, recommend annual follow-up with LDCT

## 2019-09-06 ENCOUNTER — Telehealth: Payer: Self-pay | Admitting: Primary Care

## 2019-09-06 NOTE — Telephone Encounter (Signed)
Called and spoke with Caryl Pina at [facility]. She stated that she will reach out to the patient in regards to her O2.   Will close this encounter.

## 2019-09-15 ENCOUNTER — Encounter: Payer: Self-pay | Admitting: Hematology

## 2019-09-15 ENCOUNTER — Other Ambulatory Visit: Payer: Self-pay

## 2019-09-15 ENCOUNTER — Inpatient Hospital Stay: Payer: Medicare Other | Attending: Hematology | Admitting: Hematology

## 2019-09-15 ENCOUNTER — Telehealth: Payer: Self-pay | Admitting: Hematology

## 2019-09-15 ENCOUNTER — Other Ambulatory Visit: Payer: Self-pay | Admitting: Hematology

## 2019-09-15 ENCOUNTER — Inpatient Hospital Stay: Payer: Medicare Other

## 2019-09-15 ENCOUNTER — Telehealth: Payer: Self-pay

## 2019-09-15 VITALS — BP 155/68 | HR 87 | Temp 97.7°F | Resp 19 | Ht 64.0 in | Wt 114.0 lb

## 2019-09-15 DIAGNOSIS — D473 Essential (hemorrhagic) thrombocythemia: Secondary | ICD-10-CM

## 2019-09-15 DIAGNOSIS — Z79899 Other long term (current) drug therapy: Secondary | ICD-10-CM | POA: Diagnosis not present

## 2019-09-15 DIAGNOSIS — D649 Anemia, unspecified: Secondary | ICD-10-CM | POA: Insufficient documentation

## 2019-09-15 HISTORY — DX: Essential (hemorrhagic) thrombocythemia: D47.3

## 2019-09-15 LAB — CMP (CANCER CENTER ONLY)
ALT: 22 U/L (ref 0–44)
AST: 27 U/L (ref 15–41)
Albumin: 4.5 g/dL (ref 3.5–5.0)
Alkaline Phosphatase: 55 U/L (ref 38–126)
Anion gap: 6 (ref 5–15)
BUN: 16 mg/dL (ref 8–23)
CO2: 34 mmol/L — ABNORMAL HIGH (ref 22–32)
Calcium: 9.9 mg/dL (ref 8.9–10.3)
Chloride: 99 mmol/L (ref 98–111)
Creatinine: 0.72 mg/dL (ref 0.44–1.00)
GFR, Est AFR Am: >60 mL/min (ref >60)
GFR, Estimated: >60 mL/min (ref >60)
Glucose, Bld: 134 mg/dL — ABNORMAL HIGH (ref 70–99)
Potassium: 4.5 mmol/L (ref 3.5–5.1)
Sodium: 139 mmol/L (ref 135–145)
Total Bilirubin: 0.3 mg/dL (ref 0.3–1.2)
Total Protein: 7 g/dL (ref 6.5–8.1)

## 2019-09-15 LAB — CBC WITH DIFFERENTIAL (CANCER CENTER ONLY)
Abs Immature Granulocytes: 0.03 10*3/uL (ref 0.00–0.07)
Basophils Absolute: 0.1 10*3/uL (ref 0.0–0.1)
Basophils Relative: 1 %
Eosinophils Absolute: 0.1 10*3/uL (ref 0.0–0.5)
Eosinophils Relative: 1 %
HCT: 36.6 % (ref 36.0–46.0)
Hemoglobin: 11.9 g/dL — ABNORMAL LOW (ref 12.0–15.0)
Immature Granulocytes: 0 %
Lymphocytes Relative: 24 %
Lymphs Abs: 1.7 10*3/uL (ref 0.7–4.0)
MCH: 29.9 pg (ref 26.0–34.0)
MCHC: 32.5 g/dL (ref 30.0–36.0)
MCV: 92 fL (ref 80.0–100.0)
Monocytes Absolute: 0.5 10*3/uL (ref 0.1–1.0)
Monocytes Relative: 8 %
Neutro Abs: 4.7 10*3/uL (ref 1.7–7.7)
Neutrophils Relative %: 66 %
Platelet Count: 486 10*3/uL — ABNORMAL HIGH (ref 150–400)
RBC: 3.98 MIL/uL (ref 3.87–5.11)
RDW: 14.8 % (ref 11.5–15.5)
WBC Count: 7.2 10*3/uL (ref 4.0–10.5)
nRBC: 0 % (ref 0.0–0.2)

## 2019-09-15 NOTE — Telephone Encounter (Signed)
Left vm for patient in regards to scheduling a BMBX.  Center number left for call back.

## 2019-09-15 NOTE — Patient Instructions (Signed)
Essential Thrombocythemia  Essential thrombocythemia is a condition in which a person has too many platelets (thrombocytes) in the blood. Platelets are parts of blood that stick together and form a clot (thrombus) to help the body stop bleeding after an injury. This condition may also be called primary or essential thrombocytosis. Essential thrombocythemia happens when abnormal cells in the bone marrow (megakaryocytes) make too many platelets. What are the causes? The cause of this condition is not known. What are the signs or symptoms? This condition may not cause any symptoms. If you have symptoms, they may include:  Weakness.  Headache.  Itching.  Sweating.  Fever.  Dizziness or confusion.  Tingling or burning in your hands or feet.  Blood clots.  Bleeding.  Enlarged spleen. How is this diagnosed? This condition may be diagnosed based on:  A physical exam.  Your symptoms.  Your medical history.  Blood tests.  A procedure to collect a sample of your bone marrow (bone marrow aspiration) for testing. How is this treated? If you do not have symptoms, you may not need treatment. Your health care provider may monitor your condition with regular blood tests. If you have symptoms, or if your platelet count is very high, you may be treated with:  Aspirin or other medicines to thin the blood and prevent blood clots.  Medicines to reduce the number of platelets in your blood.  A procedure to remove some platelets from your blood (plateletpheresis). During this procedure: ? Your health care provider will place an IV into one of your veins. ? The IV will be used to draw blood into a machine that separates out the extra platelets. ? The blood with reduced platelets will be returned to your body. Follow these instructions at home:  Take over-the-counter and prescription medicines only as told by your health care provider.  If you are taking blood thinners: ? Talk with  your health care provider before you take any medicines that contain aspirin or NSAIDs. These medicines increase your risk for dangerous bleeding. ? Take your medicine exactly as told, at the same time every day. ? Avoid activities that could cause injury or bruising, and follow instructions about how to prevent falls. ? Wear a medical alert bracelet or carry a card that lists what medicines you take.  Tell all health care providers, including dentists, about any medicines you are taking to prevent blood clots.  Do not use any products that contain nicotine or tobacco, such as cigarettes and e-cigarettes. If you need help quitting, ask your health care provider.  Ask your health care provider about managing or preventing high cholesterol, high blood pressure, and diabetes. These conditions can make essential thrombocythemia worse.  Keep all follow-up visits as told by your health care provider. This is important. Contact a health care provider if:  You have severe pain, and medicines do not help.  You have problems taking your medicines to prevent blood clots.  You faint. Get help right away if:   You have bleeding or blood clots.  You have unusual bruises.  You have bloody or tarry stools.  You have pink or bloody urine.  Your menstrual periods are heavier than normal, if applicable.  You have nosebleeds and bleeding gums.  You have chest pain.  You have trouble breathing.  You have any symptoms of a stroke. "BE FAST" is an easy way to remember the main warning signs of a stroke: ? B - Balance. Signs are dizziness, sudden trouble walking,  or loss of balance. ? E - Eyes. Signs are trouble seeing or a sudden change in vision. ? F - Face. Signs are sudden weakness or numbness of the face, or the face or eyelid drooping on one side. ? A - Arm. Signs are weakness or numbness in an arm. This happens suddenly and usually on one side of the body. ? S - Speech. Signs are sudden  trouble speaking, slurred speech, or trouble understanding what people say. ? T - Time. Time to call emergency services. Write down what time symptoms started.  You have other signs of a stroke, such as: ? A sudden, severe headache with no known cause. ? Nausea or vomiting. ? Seizure. These symptoms may represent a serious problem that is an emergency. Do not wait to see if the symptoms will go away. Get medical help right away. Call your local emergency services (911 in the [place] Do not drive yourself to the hospital. Summary  Essential thrombocythemia happens when abnormal cells in the bone marrow make too many platelets.  If you have symptoms, or if your platelet count is very high, you may need treatment.  Treatment can vary and may include medicines to thin the blood and prevent blood clots.  Ask your health care provider about how to manage or prevent high cholesterol, high blood pressure, and diabetes. These conditions can make essential thrombocythemia worse.  Get help right away if you have any symptoms of stroke. This information is not intended to replace advice given to you by your health care provider. Make sure you discuss any questions you have with your health care provider. Document Revised: 12/09/2018 Document Reviewed: 01/02/2017 Elsevier Patient Education  [place].

## 2019-09-15 NOTE — Telephone Encounter (Signed)
-----  Message from Gardenia Phlegm, NP sent at 09/15/2019  2:32 PM EST ----- Regarding: FW: Bone marrow bx  ----- Message ----- From: [name], MD Sent: 09/15/2019   2:03 PM EST To: Gardenia Phlegm, NP Subject: Bone marrow bx                                 Hi Cheryl Haley,  How are you? I am seeing Cheryl Haley, who was a patient of Dr. Walden Field but was lost to follow up for several months. She likely has essential thrombocythemia, and would need a bone marrow biopsy.  The studies include morphology, flow, karyotype and conventional cytogenetics. It's not a rush, and I plan to see her back in a month.  Thank you for your help.  Eulas Post

## 2019-09-15 NOTE — Telephone Encounter (Signed)
Called and LMVM for patient about moving appointment up to 12:00 today per Dr Maylon Peppers

## 2019-09-15 NOTE — Telephone Encounter (Signed)
Appointments scheduled calendar printed per 1/14 los

## 2019-09-15 NOTE — Progress Notes (Signed)
[facility] OFFICE PROGRESS NOTE  Patient Care Team: [name], MD as PCP - General (Family Medicine)  HEME/ONC OVERVIEW: 1. Essential thrombocythemia, high risk by IPSET  -Previous patient of Dr. Walden Field  -Chronic, mild thrombocytosis (plts 400-500k) since 2018   JAK2 V617F mutation positive; MPL and CAL-R negative   2. Incidental pulmonary nodules  -07/2018: RUL (62m) and RLL (869m nodules on CT chest -06/2019: improving/stable bilateral pulmonary nodules, likely infection vs inflammation  -Followed by pulmonology   TREATMENT SUMMARY:  ASA 813maily  PERTINENT NON-HEM/ONC PROBLEMS: 1. Advanced COPD on supplemental O2   ASSESSMENT & PLAN:   Essential thrombocythemia, high risk by IPSET  -I reviewed the patient's records in detail, including clinic notes from the previous hematologist, who has left the practice, as well as labs and imaging results. -In summary, patient is thrombocytosis with platelets between 400 and 500k since 2018.  MPN panel showed the presence of JAK2 V617F mutation, suggestive of essential thrombocythemia in the setting of thrombocytosis.  No previous bone marrow biopsy was done to determine diagnosis.  Patient has been lost to follow-up since early 12/2018. -Plt 486k today, relatively stable  -I discussed the pathophysiology, diagnosis, and work-up for myeloproliferative neoplasm -As she has not yet had bone marrow biopsy, I recommend obtaining bone marrow biopsy to complete the work-up -Based on the patient's age (> 60)44cardiovascular risk factors (HTN, history of smoking), and the presence of JAK2 V617F mutation, her annual risk of thrombosis is 3.56% based on IPSET-Thrombosis score  -Therefore, the bone marrow biopsy is consistent with essential thrombocythemia, patient will benefit from ASA [id number] and Hydrea   Normocytic anemia -Etiology unclear; Hgb 11.9 today, new -Patient denies any symptoms of bleeding; no prior  colonoscopy -I have ordered iron profile today -In addition, I have referred the patient to GI to rule out GI bleeding   Orders Placed This Encounter  Procedures  . Ferritin    Standing Status:   Future    Number of Occurrences:   1    Standing Expiration Date:   10/19/2020  . Iron and TIBC    Standing Status:   Future    Number of Occurrences:   1    Standing Expiration Date:   10/19/2020  . CBC w/ diff    Standing Status:   Future    Standing Expiration Date:   10/19/2020  . CMP    Standing Status:   Future    Standing Expiration Date:   10/19/2020  . LDH    Standing Status:   Future    Standing Expiration Date:   10/19/2020  . Save Smear (SSMR)    Standing Status:   Future    Standing Expiration Date:   09/14/2020   All questions were answered. The patient knows to call the clinic with any problems, questions or concerns. No barriers to learning was detected.  Return in 1 month for labs and clinic appt.   Shiree Altemus [name] 1/14/20212:05 PM  CHIEF COMPLAINT: "I am doing fine"  INTERVAL HISTORY: Ms. Cheryl Haley to clinic for follow-up of suspected essential thrombocythemia.  Patient was previously followed by Dr. HickIshmael Holtero has left practice.  She had rescheduled her appointment with the cancer clinic on several occasions.  She reports that since end of the 2019, she has been using the portable oxygen device, which helps with her breathing.  She denies any constitutional symptoms, chest pain, abdominal pain, nausea, vomiting, diarrhea, or abnormal bleeding.  She denies any history of venous thromboembolism.    REVIEW OF SYSTEMS:   Constitutional: ( - ) fevers, ( - )  chills , ( - ) night sweats Eyes: ( - ) blurriness of vision, ( - ) double vision, ( - ) watery eyes Ears, nose, mouth, throat, and face: ( - ) mucositis, ( - ) sore throat Respiratory: ( - ) cough, ( + ) exertional dyspnea, ( - ) wheezes Cardiovascular: ( - ) palpitation, ( - ) chest discomfort, ( - ) lower  extremity swelling Gastrointestinal:  ( - ) nausea, ( - ) heartburn, ( - ) change in bowel habits Skin: ( - ) abnormal skin rashes Lymphatics: ( - ) new lymphadenopathy, ( - ) easy bruising Neurological: ( - ) numbness, ( - ) tingling, ( - ) new weaknesses Behavioral/Psych: ( - ) mood change, ( - ) new changes  All other systems were reviewed with the patient and are negative.  SUMMARY OF ONCOLOGIC HISTORY: Oncology History   No history exists.    I have reviewed the past medical history, past surgical history, social history and family history with the patient and they are unchanged from previous note.  ALLERGIES:  has No Known Allergies.  MEDICATIONS:  Current Outpatient Medications  Medication Sig Dispense Refill  . aspirin EC 81 MG tablet Take 81 mg daily by mouth.     . bisoprolol (ZEBETA) 5 MG tablet Take 0.5 tablets (2.5 mg total) by mouth daily. 30 tablet 0  . diltiazem (CARDIZEM) 60 MG tablet Take 1 tablet (60 mg total) by mouth 2 (two) times daily. 60 tablet 0  . Fluticasone-Umeclidin-Vilant (TRELEGY ELLIPTA) 100-62.5-25 MCG/INH AEPB Inhale 1 puff into the lungs daily. 1 each 0  . levothyroxine (SYNTHROID, LEVOTHROID) 100 MCG tablet Take 100 mcg daily before breakfast by mouth.    . losartan (COZAAR) 50 MG tablet Take 50 mg by mouth daily.    . metoprolol succinate (TOPROL-XL) 25 MG 24 hr tablet Take 25 mg by mouth daily.    . Multiple Vitamin (MULTIVITAMIN) tablet Take 1 tablet by mouth daily.    . rosuvastatin (CRESTOR) 5 MG tablet Take 5 mg by mouth daily.    Marland Kitchen SPIRIVA HANDIHALER 18 MCG inhalation capsule INHALE 1 PUFF BY MOUTH ONCE DAILY. PATIENT NEEDS OFFICE VISIT. 30 capsule 3  . SYMBICORT 160-4.5 MCG/ACT inhaler Inhale 2 puffs into the lungs 2 (two) times daily. 1 Inhaler 11   No current facility-administered medications for this visit.    PHYSICAL EXAMINATION: ECOG PERFORMANCE STATUS: 2 - Symptomatic, <50% confined to bed  Today's Vitals   09/15/19 1356  BP:  (!) 155/68  Pulse: 87  Resp: 19  Temp: 97.7 F (36.5 C)  TempSrc: Temporal  SpO2: 98%  Weight: 114 lb (51.7 kg)  Height: '5\' 4"'$  (1.626 m)  PainSc: 0-No pain   Body mass index is 19.57 kg/m.  Filed Weights   09/15/19 1356  Weight: 114 lb (51.7 kg)    GENERAL: alert, no distress and comfortable on supplemental O2  SKIN: skin color, texture, turgor are normal, no rashes or significant lesions EYES: conjunctiva are pink and non-injected, sclera clear OROPHARYNX: no exudate, no erythema; lips, buccal mucosa, and tongue normal  NECK: supple, non-tender LUNGS: decreased air movement bilaterally, no wheezing  HEART: regular rate & rhythm and no murmurs and no lower extremity edema ABDOMEN: soft, non-tender, non-distended, normal bowel sounds Musculoskeletal: no cyanosis of digits and no clubbing  PSYCH: alert & oriented  x 3, speaking in short sentences   LABORATORY DATA:  I have reviewed the data as listed    Component Value Date/Time   NA 139 09/15/2019 1326   K 4.5 09/15/2019 1326   CL 99 09/15/2019 1326   CO2 34 (H) 09/15/2019 1326   GLUCOSE 134 (H) 09/15/2019 1326   BUN 16 09/15/2019 1326   CREATININE 0.72 09/15/2019 1326   CALCIUM 9.9 09/15/2019 1326   PROT 7.0 09/15/2019 1326   ALBUMIN 4.5 09/15/2019 1326   AST 27 09/15/2019 1326   ALT 22 09/15/2019 1326   ALKPHOS 55 09/15/2019 1326   BILITOT 0.3 09/15/2019 1326   GFRNONAA >60 09/15/2019 1326   GFRAA >60 09/15/2019 1326    No results found for: SPEP, UPEP  Lab Results  Component Value Date   WBC 7.2 09/15/2019   NEUTROABS 4.7 09/15/2019   HGB 11.9 (L) 09/15/2019   HCT 36.6 09/15/2019   MCV 92.0 09/15/2019   PLT 486 (H) 09/15/2019      Chemistry      Component Value Date/Time   NA 139 09/15/2019 1326   K 4.5 09/15/2019 1326   CL 99 09/15/2019 1326   CO2 34 (H) 09/15/2019 1326   BUN 16 09/15/2019 1326   CREATININE 0.72 09/15/2019 1326      Component Value Date/Time   CALCIUM 9.9 09/15/2019 1326    ALKPHOS 55 09/15/2019 1326   AST 27 09/15/2019 1326   ALT 22 09/15/2019 1326   BILITOT 0.3 09/15/2019 1326       RADIOGRAPHIC STUDIES: I have personally reviewed the radiological images as listed below and agreed with the findings in the report. No results found.

## 2019-09-16 LAB — IRON AND TIBC
Iron: 135 ug/dL (ref 41–142)
Saturation Ratios: 51 % (ref 21–57)
TIBC: 266 ug/dL (ref 236–444)
UIBC: 131 ug/dL (ref 120–384)

## 2019-09-16 LAB — LACTATE DEHYDROGENASE: LDH: 271 U/L — ABNORMAL HIGH (ref 98–192)

## 2019-09-16 LAB — FERRITIN: Ferritin: 175 ng/mL (ref 11–307)

## 2019-09-16 NOTE — Telephone Encounter (Signed)
Thank you.  Dr. Sheyann Sulton  

## 2019-09-16 NOTE — Telephone Encounter (Signed)
FYI

## 2019-09-16 NOTE — Telephone Encounter (Signed)
Second call to patient to schedule BMBX.  Message left again on VM asking patient to call back to make appointment.

## 2019-09-19 ENCOUNTER — Encounter: Payer: Self-pay | Admitting: Cardiology

## 2019-09-19 NOTE — Progress Notes (Signed)
Cardiology Office Consult Note    Date:  09/20/2019   ID:  Cheryl Haley, DOB 10-May-1956, MRN [id number]  PCP:  Cheryl Fess, MD  Cardiologist:  Cheryl Him, MD   Chief Complaint  Patient presents with  . Palpitations    History of Present Illness:  Cheryl Haley is a 64 y.o. female who is being seen today for the evaluation of palpitations at the request of Little, Cheryl Bihari, MD.  This is a 64yo female with a hx of HTN, COPD, HLD and tobacco abuse who is referred for evaluation of palpitations.  She tells me that she has been having a fluttering sensation on the left side of her chest for a few months that is very sporadic.  It was very frequent in December but seems to have quieted down this month.  There are no associated sx of CP, dizziness or syncope.  She has chronic DOE related to COPD and is on O2.  She denies any PND, orthopnea, LE edema.  She was noted to have coronary calcifications in all 3 coronaries by screening Chest CT for hx of tobacco last fall.  She has not had any anginal sx and EKG is normal.    Past Medical History:  Diagnosis Date  . Anxiety   . 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 COPD (chronic obstructive pulmonary disease) ([place])   . Emphysema lung ([place])   . Essential thrombocythemia ([place]) 09/15/2019  . History of hiatal hernia   . Hx of TIA (transient ischemic attack) and stroke   . Hyperlipidemia   . Hypertension   . Hypothyroidism   . Impaired fasting glucose   . Pleural effusion 05/15/2014   CXR 05/2014:  New small right effusion.    . Pneumonia   . Protein-calorie malnutrition, severe 09/17/2018  . Pulmonary nodules 07/14/2018   07/2017 CT chest >pulmonary nodules noted.  07/2017 PET scan -Hypermetabolic LUL nodule  [id number] CT chest >decreased nodule size, new pulmonary nodules noted.  FOB -atypical cells  CT chest 07/2018 >stable nodules to smaller on established nodules . 2 new nodules RUL , RLL >CT chest 3 months   . Thyroid disease   . Tobacco abuse  11/28/2015   Smoked 2 packs of cigarettes daily from teenage years until age 57, quit in 2016   . Unspecified hypothyroidism 09/20/2013  . Viral respiratory infection 09/16/2018    Past Surgical History:  Procedure Laterality Date  . ESOPHAGOGASTRODUODENOSCOPY    . VIDEO BRONCHOSCOPY WITH ENDOBRONCHIAL NAVIGATION Left 07/29/2017   Procedure: VIDEO BRONCHOSCOPY WITH ENDOBRONCHIAL NAVIGATION;  Surgeon: Collene Gobble, MD;  Location: [facility] OR;  Service: Thoracic;  Laterality: Left;    Current Medications: Current Meds  Medication Sig  . aspirin EC 81 MG tablet Take 81 mg daily by mouth.   . diltiazem (CARDIZEM) 60 MG tablet Take 1 tablet (60 mg total) by mouth 2 (two) times daily.  . EUTHYROX 75 MCG tablet Take 75 mcg by mouth daily.  . Fluticasone-Umeclidin-Vilant (TRELEGY ELLIPTA) 100-62.5-25 MCG/INH AEPB Inhale 1 puff into the lungs daily.  Marland Kitchen ipratropium-albuterol (DUONEB) 0.5-2.5 (3) MG/3ML SOLN Take 3 mLs by nebulization.  Marland Kitchen levothyroxine (SYNTHROID, LEVOTHROID) 100 MCG tablet Take 100 mcg daily before breakfast by mouth.  . losartan (COZAAR) 50 MG tablet Take 50 mg by mouth daily.  . metoprolol succinate (TOPROL-XL) 25 MG 24 hr tablet Take 25 mg by mouth daily.  . Multiple Vitamin (MULTIVITAMIN) tablet Take 1 tablet by mouth daily.  . rosuvastatin (CRESTOR)  5 MG tablet Take 5 mg by mouth daily.  Marland Kitchen SPIRIVA HANDIHALER 18 MCG inhalation capsule INHALE 1 PUFF BY MOUTH ONCE DAILY. PATIENT NEEDS OFFICE VISIT.  . SYMBICORT 160-4.5 MCG/ACT inhaler Inhale 2 puffs into the lungs 2 (two) times daily.    Allergies:   Patient has no known allergies.   Social History   Socioeconomic History  . Marital status: Divorced    Spouse name: Not on file  . Number of children: Not on file  . Years of education: Not on file  . Highest education level: Not on file  Occupational History  . Not on file  Tobacco Use  . Smoking status: Former Smoker    Packs/day: 2.00    Years: 40.00    Pack years:  80.00    Types: Cigarettes    Quit date: 03/16/2017    Years since quitting: 2.5  . Smokeless tobacco: Never Used  . Tobacco comment:    Substance and Sexual Activity  . Alcohol use: Yes    Alcohol/week: 14.0 standard drinks    Types: 14 Cans of beer per week  . Drug use: No  . Sexual activity: Not on file  Other Topics Concern  . Not on file  Social History Narrative  . Not on file   Social Determinants of Health   Financial Resource Strain:   . Difficulty of Paying Living Expenses: Not on file  Food Insecurity:   . Worried About Charity fundraiser in the Last Year: Not on file  . Ran Out of Food in the Last Year: Not on file  Transportation Needs:   . Lack of Transportation (Medical): Not on file  . Lack of Transportation (Non-Medical): Not on file  Physical Activity:   . Days of Exercise per Week: Not on file  . Minutes of Exercise per Session: Not on file  Stress:   . Feeling of Stress : Not on file  Social Connections:   . Frequency of Communication with Friends and Family: Not on file  . Frequency of Social Gatherings with Friends and Family: Not on file  . Attends Religious Services: Not on file  . Active Member of Clubs or Organizations: Not on file  . Attends Archivist Meetings: Not on file  . Marital Status: Not on file     Family History:  The patient's family history includes Cancer in her sister and sister; Congestive Heart Failure in her sister and sister; Emphysema in her brother, father, sister, and sister; Heart disease in her brother, brother, father, mother, and sister.   ROS:   Please see the history of present illness.    ROS All other systems reviewed and are negative.  No flowsheet data found.     PHYSICAL EXAM:   VS:  BP (!) 154/64   Pulse 75   Ht 5\' 4"  (1.626 m)   Wt 114 lb 12.8 oz (52.1 kg)   SpO2 94%   BMI 19.71 kg/m    GEN: Well nourished, well developed, in no acute distress  HEENT: normal  Neck: no JVD, carotid  bruits, or masses Cardiac: RRR; no murmurs, rubs, or gallops,no edema.  Intact distal pulses bilaterally.  Respiratory:  clear to auscultation bilaterally, normal work of breathing GI: soft, nontender, nondistended, + BS MS: no deformity or atrophy  Skin: warm and dry, no rash Neuro:  Alert and Oriented x 3, Strength and sensation are intact Psych: euthymic mood, full affect  Wt Readings from Last  3 Encounters:  09/20/19 114 lb 12.8 oz (52.1 kg)  09/15/19 114 lb (51.7 kg)  09/05/19 112 lb 12.8 oz (51.2 kg)      Studies/Labs Reviewed:   EKG:  EKG is ordered today.  The ekg ordered today demonstrates NSR with no ST changes  Recent Labs: 09/15/2019: ALT 22; BUN 16; Creatinine 0.72; Hemoglobin 11.9; Platelet Count 486; Potassium 4.5; Sodium 139   Lipid Panel No results found for: CHOL, TRIG, HDL, CHOLHDL, VLDL, LDLCALC, LDLDIRECT  Additional studies/ records that were reviewed today include:  EKG, office notes from PCP    ASSESSMENT:    1. Palpitations   2. Essential hypertension   3. Pure hypercholesterolemia      PLAN:  In order of problems listed above:  1.  Palpitations -need to rule out atrial fibrillation or flutter -check 30 day event monitor  2.  HTN -BP borderline controlled -continue Cardizem 60mg  BID, Losaratn 50mg  daily, Toprol XL 25mg  daily  3.  HLD -continue Crestor 5mg  daily -her LDL goal is < 70 given her coronary calcifications -LDL was 114 recently -per PCP  4.  Coronary artery calcifications -she has no anginal sx -will get a coronary calcium score to assess risk -if calcium score high then may consider Lexiscan myoview to rule out silent ischemia -continue statin    Medication Adjustments/Labs and Tests Ordered: Current medicines are reviewed at length with the patient today.  Concerns regarding medicines are outlined above.  Medication changes, Labs and Tests ordered today are listed in the Patient Instructions below.  There are no  Patient Instructions on file for this visit.   Signed, Cheryl Him, MD  09/20/2019 10:30 AM    [facility] [address], [place], [place]  [address] Phone: [phone]; Fax: [phone]

## 2019-09-20 ENCOUNTER — Other Ambulatory Visit: Payer: Self-pay

## 2019-09-20 ENCOUNTER — Ambulatory Visit (INDEPENDENT_AMBULATORY_CARE_PROVIDER_SITE_OTHER): Payer: Medicare Other | Admitting: Cardiology

## 2019-09-20 ENCOUNTER — Telehealth: Payer: Self-pay | Admitting: Radiology

## 2019-09-20 ENCOUNTER — Encounter: Payer: Self-pay | Admitting: Cardiology

## 2019-09-20 VITALS — BP 154/64 | HR 75 | Ht 64.0 in | Wt 114.8 lb

## 2019-09-20 DIAGNOSIS — I10 Essential (primary) hypertension: Secondary | ICD-10-CM

## 2019-09-20 DIAGNOSIS — E7800 Pure hypercholesterolemia, unspecified: Secondary | ICD-10-CM | POA: Diagnosis not present

## 2019-09-20 DIAGNOSIS — R002 Palpitations: Secondary | ICD-10-CM

## 2019-09-20 NOTE — Telephone Encounter (Signed)
Enrolled patient for a 30 day [employer] Event monitor to be mailed to patients home.  

## 2019-09-20 NOTE — Patient Instructions (Signed)
Medication Instructions:  Your physician recommends that you continue on your current medications as directed. Please refer to the Current Medication list given to you today.  Labwork: None ordered.  Testing/Procedures: Your physician has recommended that you wear an event monitor. Event monitors are medical devices that record the heart's electrical activity. Doctors most often [place] these monitors to diagnose arrhythmias. Arrhythmias are problems with the speed or rhythm of the heartbeat. The monitor is a small, portable device. You can wear one while you do your normal daily activities. This is usually used to diagnose what is causing palpitations/syncope (passing out).  Your physician has requested that you have cardiac Calcium CT. Cardiac computed tomography (CT) is a painless test that uses an x-ray machine to take clear, detailed pictures of your heart. For further information please visit [employer]ugeFiesta[employer]tn. Please follow instruction sheet as given.  Follow-Up: Your physician recommends that you schedule a follow-up appointment as needed with Dr. Radford Pax, MD  Can do CA scoring today if there is availability  Any Other Special Instructions Will Be Listed Below (If Applicable).     If you need a refill on your cardiac medications before your next appointment, please call your pharmacy.

## 2019-09-26 ENCOUNTER — Telehealth: Payer: Self-pay

## 2019-09-26 NOTE — Telephone Encounter (Signed)
RN left voicemail for patient to return call.  Re: Apt for bone marrow biopsy.

## 2019-09-27 ENCOUNTER — Other Ambulatory Visit: Payer: Self-pay

## 2019-09-27 ENCOUNTER — Inpatient Hospital Stay: Payer: Medicare Other

## 2019-09-27 ENCOUNTER — Inpatient Hospital Stay (HOSPITAL_BASED_OUTPATIENT_CLINIC_OR_DEPARTMENT_OTHER): Payer: Medicare Other | Admitting: Adult Health

## 2019-09-27 VITALS — BP 149/72 | HR 73 | Temp 97.9°F | Resp 18

## 2019-09-27 DIAGNOSIS — D473 Essential (hemorrhagic) thrombocythemia: Secondary | ICD-10-CM | POA: Diagnosis not present

## 2019-09-27 LAB — CBC WITH DIFFERENTIAL (CANCER CENTER ONLY)
Abs Immature Granulocytes: 0.01 10*3/uL (ref 0.00–0.07)
Basophils Absolute: 0 10*3/uL (ref 0.0–0.1)
Basophils Relative: 1 %
Eosinophils Absolute: 0.1 10*3/uL (ref 0.0–0.5)
Eosinophils Relative: 1 %
HCT: 34.8 % — ABNORMAL LOW (ref 36.0–46.0)
Hemoglobin: 11.6 g/dL — ABNORMAL LOW (ref 12.0–15.0)
Immature Granulocytes: 0 %
Lymphocytes Relative: 21 %
Lymphs Abs: 1.2 10*3/uL (ref 0.7–4.0)
MCH: 30.5 pg (ref 26.0–34.0)
MCHC: 33.3 g/dL (ref 30.0–36.0)
MCV: 91.6 fL (ref 80.0–100.0)
Monocytes Absolute: 0.4 10*3/uL (ref 0.1–1.0)
Monocytes Relative: 7 %
Neutro Abs: 4 10*3/uL (ref 1.7–7.7)
Neutrophils Relative %: 70 %
Platelet Count: 383 10*3/uL (ref 150–400)
RBC: 3.8 MIL/uL — ABNORMAL LOW (ref 3.87–5.11)
RDW: 15.6 % — ABNORMAL HIGH (ref 11.5–15.5)
WBC Count: 5.6 10*3/uL (ref 4.0–10.5)
nRBC: 0 % (ref 0.0–0.2)

## 2019-09-27 NOTE — Progress Notes (Signed)
INDICATION: Essential thrombocythemia  Brief examination was performed. ENT: adequate airway clearance Heart: regular rate and rhythm.No Murmurs Lungs: clear to auscultation, no wheezes, normal respiratory effort  Bone Marrow Biopsy and Aspiration Procedure Note   Informed consent was obtained and potential risks including bleeding, infection and pain were reviewed with the patient.  The patient's name, date of birth, identification, consent and allergies were verified prior to the start of procedure and time out was performed.  The left posterior iliac crest was chosen as the site of biopsy.  The skin was prepped with ChloraPrep.   14 cc of 2% lidocaine was used to provide local anaesthesia.   10 cc of bone marrow aspirate was obtained followed by 1cm biopsy.  Pressure was applied to the biopsy site and bandage was placed over the biopsy site. Patient was made to lie on the back for 30 mins prior to discharge.  The procedure was tolerated well. COMPLICATIONS: None BLOOD LOSS: none The patient was discharged home in stable condition with a 1 week follow up to review results.  Patient was provided with post bone marrow biopsy instructions and instructed to call if there was any bleeding or worsening pain.  Specimens sent for flow cytometry, cytogenetics and additional studies.  Signed Scot Dock, NP

## 2019-09-27 NOTE — Patient Instructions (Signed)
Bone Marrow Aspiration and Bone Marrow Biopsy, Adult, Care After This sheet gives you information about how to care for yourself after your procedure. Your health care provider may also give you more specific instructions. If you have problems or questions, contact your health care provider. What can I expect after the procedure? After the procedure, it is common to have:  Mild pain and tenderness.  Swelling.  Bruising. Follow these instructions at home: Puncture site care   Follow instructions from your health care provider about how to take care of the puncture site. Make sure you: ? Wash your hands with soap and water before and after you change your bandage (dressing). If soap and water are not available, use hand sanitizer. ? Change your dressing as told by your health care provider.  Check your puncture site every day for signs of infection. Check for: ? More redness, swelling, or pain. ? Fluid or blood. ? Warmth. ? Pus or a bad smell. Activity  Return to your normal activities as told by your health care provider. Ask your health care provider what activities are safe for you.  Do not lift anything that is heavier than 10 lb (4.5 kg), or the limit that you are told, until your health care provider says that it is safe.  Do not drive for 24 hours if you were given a sedative during your procedure. General instructions   Take over-the-counter and prescription medicines only as told by your health care provider.  Do not take baths, swim, or use a hot tub until your health care provider approves. Ask your health care provider if you may take showers. You may only be allowed to take sponge baths.  If directed, put ice on the affected area. To do this: ? Put ice in a plastic bag. ? Place a towel between your skin and the bag. ? Leave the ice on for 20 minutes, 2-3 times a day.  Keep all follow-up visits as told by your health care provider. This is important. Contact a  health care provider if:  Your pain is not controlled with medicine.  You have a fever.  You have more redness, swelling, or pain around the puncture site.  You have fluid or blood coming from the puncture site.  Your puncture site feels warm to the touch.  You have pus or a bad smell coming from the puncture site. Summary  After the procedure, it is common to have mild pain, tenderness, swelling, and bruising.  Follow instructions from your health care provider about how to take care of the puncture site and what activities are safe for you.  Take over-the-counter and prescription medicines only as told by your health care provider.  Contact a health care provider if you have any signs of infection, such as fluid or blood coming from the puncture site. This information is not intended to replace advice given to you by your health care provider. Make sure you discuss any questions you have with your health care provider. Document Revised: 01/04/2019 Document Reviewed: 01/04/2019 Elsevier Patient Education  2020 [employer]  

## 2019-09-28 ENCOUNTER — Encounter (INDEPENDENT_AMBULATORY_CARE_PROVIDER_SITE_OTHER): Payer: Medicare Other

## 2019-09-28 DIAGNOSIS — R002 Palpitations: Secondary | ICD-10-CM

## 2019-09-28 LAB — SURGICAL PATHOLOGY

## 2019-10-06 ENCOUNTER — Encounter (HOSPITAL_COMMUNITY): Payer: Self-pay | Admitting: Hematology

## 2019-10-07 ENCOUNTER — Ambulatory Visit (INDEPENDENT_AMBULATORY_CARE_PROVIDER_SITE_OTHER)
Admission: RE | Admit: 2019-10-07 | Discharge: 2019-10-07 | Disposition: A | Discharge status: Home / Self Care | Payer: Self-pay | Source: Ambulatory Visit | Attending: Cardiology | Admitting: Cardiology

## 2019-10-07 ENCOUNTER — Other Ambulatory Visit: Payer: Self-pay

## 2019-10-07 DIAGNOSIS — R002 Palpitations: Secondary | ICD-10-CM

## 2019-10-07 DIAGNOSIS — E7800 Pure hypercholesterolemia, unspecified: Secondary | ICD-10-CM

## 2019-10-10 ENCOUNTER — Telehealth: Payer: Self-pay

## 2019-10-10 DIAGNOSIS — R002 Palpitations: Secondary | ICD-10-CM

## 2019-10-10 DIAGNOSIS — E7800 Pure hypercholesterolemia, unspecified: Secondary | ICD-10-CM

## 2019-10-10 MED ORDER — ROSUVASTATIN CALCIUM 10 MG PO TABS: 10.0000 mg | ORAL_TABLET | Freq: Every day | ORAL | 3 refills | Status: DC

## 2019-10-10 NOTE — Telephone Encounter (Signed)
-----   Message from Sueanne Margarita, MD sent at 10/09/2019 12:08 AM EST ----- CHest CT showed aortic  atherosclerosis and coronary calcium score of 678 which is very high for her age.  Last LDL was 100 and LDL goal < 70.  Please increase Crestor to 10mg  daily and repeat FLP and ALT in 6 weeks.  Please get Lexiscan myoview to rule out ischemia

## 2019-10-10 NOTE — Telephone Encounter (Signed)
The patient has been notified of the result and verbalized understanding.  All questions (if any) were answered. Antonieta Iba, RN 10/10/2019 2:49 PM  Patient will increase Crestor to 10 mg daily. She will come in for lab work on 03/23. Orders placed for lexiscan myoview.

## 2019-10-17 ENCOUNTER — Other Ambulatory Visit: Payer: Self-pay

## 2019-10-17 ENCOUNTER — Encounter: Payer: Self-pay | Admitting: Hematology

## 2019-10-17 ENCOUNTER — Inpatient Hospital Stay: Payer: Medicare Other | Attending: Hematology

## 2019-10-17 ENCOUNTER — Inpatient Hospital Stay (HOSPITAL_BASED_OUTPATIENT_CLINIC_OR_DEPARTMENT_OTHER): Payer: Medicare Other | Admitting: Hematology

## 2019-10-17 VITALS — BP 170/53 | HR 73 | Temp 97.5°F | Resp 18 | Ht 63.0 in | Wt 116.1 lb

## 2019-10-17 DIAGNOSIS — Z79899 Other long term (current) drug therapy: Secondary | ICD-10-CM | POA: Diagnosis not present

## 2019-10-17 DIAGNOSIS — R918 Other nonspecific abnormal finding of lung field: Secondary | ICD-10-CM | POA: Diagnosis not present

## 2019-10-17 DIAGNOSIS — J449 Chronic obstructive pulmonary disease, unspecified: Secondary | ICD-10-CM | POA: Insufficient documentation

## 2019-10-17 DIAGNOSIS — Z87891 Personal history of nicotine dependence: Secondary | ICD-10-CM | POA: Diagnosis not present

## 2019-10-17 DIAGNOSIS — I10 Essential (primary) hypertension: Secondary | ICD-10-CM | POA: Insufficient documentation

## 2019-10-17 DIAGNOSIS — Z7982 Long term (current) use of aspirin: Secondary | ICD-10-CM | POA: Diagnosis not present

## 2019-10-17 DIAGNOSIS — D473 Essential (hemorrhagic) thrombocythemia: Secondary | ICD-10-CM | POA: Diagnosis present

## 2019-10-17 DIAGNOSIS — Z7951 Long term (current) use of inhaled steroids: Secondary | ICD-10-CM | POA: Diagnosis not present

## 2019-10-17 LAB — CBC WITH DIFFERENTIAL (CANCER CENTER ONLY)
Abs Immature Granulocytes: 0.03 10*3/uL (ref 0.00–0.07)
Basophils Absolute: 0.1 10*3/uL (ref 0.0–0.1)
Basophils Relative: 1 %
Eosinophils Absolute: 0.1 10*3/uL (ref 0.0–0.5)
Eosinophils Relative: 1 %
HCT: 36.9 % (ref 36.0–46.0)
Hemoglobin: 12 g/dL (ref 12.0–15.0)
Immature Granulocytes: 0 %
Lymphocytes Relative: 27 %
Lymphs Abs: 1.9 10*3/uL (ref 0.7–4.0)
MCH: 30.5 pg (ref 26.0–34.0)
MCHC: 32.5 g/dL (ref 30.0–36.0)
MCV: 93.9 fL (ref 80.0–100.0)
Monocytes Absolute: 0.5 10*3/uL (ref 0.1–1.0)
Monocytes Relative: 7 %
Neutro Abs: 4.4 10*3/uL (ref 1.7–7.7)
Neutrophils Relative %: 64 %
Platelet Count: 450 10*3/uL — ABNORMAL HIGH (ref 150–400)
RBC: 3.93 MIL/uL (ref 3.87–5.11)
RDW: 15.2 % (ref 11.5–15.5)
WBC Count: 7 10*3/uL (ref 4.0–10.5)
nRBC: 0 % (ref 0.0–0.2)

## 2019-10-17 LAB — CMP (CANCER CENTER ONLY)
ALT: 20 U/L (ref 0–44)
AST: 27 U/L (ref 15–41)
Albumin: 4.5 g/dL (ref 3.5–5.0)
Alkaline Phosphatase: 46 U/L (ref 38–126)
Anion gap: 6 (ref 5–15)
BUN: 19 mg/dL (ref 8–23)
CO2: 34 mmol/L — ABNORMAL HIGH (ref 22–32)
Calcium: 9.6 mg/dL (ref 8.9–10.3)
Chloride: 98 mmol/L (ref 98–111)
Creatinine: 0.92 mg/dL (ref 0.44–1.00)
GFR, Est AFR Am: >60 mL/min (ref >60)
GFR, Estimated: >60 mL/min (ref >60)
Glucose, Bld: 112 mg/dL — ABNORMAL HIGH (ref 70–99)
Potassium: 5.1 mmol/L (ref 3.5–5.1)
Sodium: 138 mmol/L (ref 135–145)
Total Bilirubin: 0.5 mg/dL (ref 0.3–1.2)
Total Protein: 6.8 g/dL (ref 6.5–8.1)

## 2019-10-17 LAB — SAVE SMEAR(SSMR), FOR PROVIDER SLIDE REVIEW

## 2019-10-17 LAB — LACTATE DEHYDROGENASE: LDH: 212 U/L — ABNORMAL HIGH (ref 98–192)

## 2019-10-17 MED ORDER — HYDROXYUREA 500 MG PO CAPS: 500.0000 mg | ORAL_CAPSULE | ORAL | 5 refills | Status: DC

## 2019-10-17 MED ORDER — PROCHLORPERAZINE MALEATE 10 MG PO TABS: 10.0000 mg | ORAL_TABLET | Freq: Three times a day (TID) | ORAL | 5 refills | Status: DC | PRN

## 2019-10-17 NOTE — Progress Notes (Signed)
[place] OFFICE PROGRESS NOTE  Patient Care Team: [name], MD as PCP - General (Family Medicine) Sueanne Margarita, MD as PCP - Cardiology (Cardiology)  HEME/ONC OVERVIEW: 1. Essential thrombocythemia, high risk by IPSET  -Previous patient of Dr. Walden Field  -Chronic, mild thrombocytosis (plts 400-500k) since 2018   JAK2 V617F mutation positive; MPL and CAL-R negative   Slightly hypercellular marrow for age with atypical megakaryocytic proliferation, consistent with DVT -10/2019 - present: Hydrea   2. Incidental pulmonary nodules  -07/2018: RUL (42m) and RLL (825m nodules on CT chest -06/2019: improving/stable bilateral pulmonary nodules, likely infection vs inflammation  -Followed by pulmonology   TREATMENT SUMMARY:  ASA 8117maily  10/2019 - present: Hydrea, currently 500m5mdays   PERTINENT NON-HEM/ONC PROBLEMS: 1. Advanced COPD on supplemental O2   ASSESSMENT & PLAN:   Essential thrombocythemia, high risk by IPSET  -I reviewed the bone marrow biopsy in detail with the patient -Given the slightly hypercellular marrow with atypical megakaryocytic proliferation in the presence of JAK2 mutation, this is consistent with ET -Based on the patient's age (> 60),4ardiovascular risk factors (HTN, history of smoking), and the presence of JAK2 V617F mutation, her annual risk of thrombosis is 3.56% based on IPSET-Thrombosis score, and would benefit from cytoreduction to reduce the risk of thrombosis -I discussed the rationale for Hydrea, as well as some of the potential toxicities.  After lengthy discussion, patient agreed to initiate treatment as recommended. -As her thrombocytosis is relatively mild, and in light of her overall frailty and extensive medical comorbidities, I will start Hydrea at 500mg44mays  -We will repeat her labs in 1 month to monitor response -Goal platelet count < 400k  -I have also prescribed PRN Compazine for nausea   Orders Placed This  Encounter  Procedures  . CBC with Differential (Cancer Center Only)    Standing Status:   Future    Standing Expiration Date:   11/20/2020  . CMP ([facility])    Standing Status:   Future    Standing Expiration Date:   11/20/2020  . Lactate dehydrogenase    Standing Status:   Future    Standing Expiration Date:   11/20/2020   The total time spent in the encounter was 32 minutes, including face-to-face time with the patient, review of various tests results, order additional studies/medications, documentation, and coordination of care plan.   All questions were answered. The patient knows to call the clinic with any problems, questions or concerns. No barriers to learning was detected.  Return in 1 month for labs and clinic follow-up.   Jaunice Mirza ZTish Men2/15/202110:26 AM  CHIEF COMPLAINT: "I am doing fine"  INTERVAL HISTORY: Ms. CheatLatkarns to clinic for follow-up of recent bone marrow biopsy results.  Patient reports that she had moderate soreness in the hip after the bone marrow biopsy, which lasted several days, but it resolved over time.  She also has had palpitations since 08/2019, but by the time she saw her cardiologist in 09/2019, her palpitations have improved significantly.  Therefore, cardiologist placed her on the heart monitor, and she has been wearing that for the past 3 weeks.  She is also scheduled for a stress test later this month.  She denies any chest pain, diaphoresis, or dizziness.  She denies any other complaint today.  REVIEW OF SYSTEMS:   Constitutional: ( - ) fevers, ( - )  chills , ( - ) night sweats Eyes: ( - ) blurriness of  vision, ( - ) double vision, ( - ) watery eyes Ears, nose, mouth, throat, and face: ( - ) mucositis, ( - ) sore throat Respiratory: ( - ) cough, ( - ) dyspnea, ( - ) wheezes Cardiovascular: ( + ) palpitation, ( - ) chest discomfort, ( - ) lower extremity swelling Gastrointestinal:  ( - ) nausea, ( - ) heartburn, ( - ) change in  bowel habits Skin: ( - ) abnormal skin rashes Lymphatics: ( - ) new lymphadenopathy, ( - ) easy bruising Neurological: ( - ) numbness, ( - ) tingling, ( - ) new weaknesses Behavioral/Psych: ( - ) mood change, ( - ) new changes  All other systems were reviewed with the patient and are negative.  SUMMARY OF ONCOLOGIC HISTORY: Oncology History   No history exists.    I have reviewed the past medical history, past surgical history, social history and family history with the patient and they are unchanged from previous note.  ALLERGIES:  has No Known Allergies.  MEDICATIONS:  Current Outpatient Medications  Medication Sig Dispense Refill  . aspirin EC 81 MG tablet Take 81 mg daily by mouth.     . diltiazem (CARDIZEM) 60 MG tablet Take 1 tablet (60 mg total) by mouth 2 (two) times daily. 60 tablet 0  . EUTHYROX 75 MCG tablet Take 75 mcg by mouth daily.    . Fluticasone-Umeclidin-Vilant (TRELEGY ELLIPTA) 100-62.5-25 MCG/INH AEPB Inhale 1 puff into the lungs daily. 1 each 0  . ipratropium-albuterol (DUONEB) 0.5-2.5 (3) MG/3ML SOLN Take 3 mLs by nebulization.    Marland Kitchen levothyroxine (SYNTHROID, LEVOTHROID) 100 MCG tablet Take 100 mcg daily before breakfast by mouth.    . losartan (COZAAR) 50 MG tablet Take 50 mg by mouth daily.    . metoprolol succinate (TOPROL-XL) 25 MG 24 hr tablet Take 25 mg by mouth daily.    . Multiple Vitamin (MULTIVITAMIN) tablet Take 1 tablet by mouth daily.    . rosuvastatin (CRESTOR) 10 MG tablet Take 1 tablet (10 mg total) by mouth daily. 90 tablet 3  . SPIRIVA HANDIHALER 18 MCG inhalation capsule INHALE 1 PUFF BY MOUTH ONCE DAILY. PATIENT NEEDS OFFICE VISIT. 30 capsule 3  . SYMBICORT 160-4.5 MCG/ACT inhaler Inhale 2 puffs into the lungs 2 (two) times daily. 1 Inhaler 11  . hydroxyurea (HYDREA) 500 MG capsule Take 1 capsule (500 mg total) by mouth every other day. May take with food to minimize GI side effects. 15 capsule 5  . prochlorperazine (COMPAZINE) 10 MG tablet  Take 1 tablet (10 mg total) by mouth every 8 (eight) hours as needed for nausea or vomiting. 30 tablet 5   No current facility-administered medications for this visit.    PHYSICAL EXAMINATION: ECOG PERFORMANCE STATUS: 2 - Symptomatic, <50% confined to bed  Today's Vitals   10/17/19 1007  BP: (!) 170/53  Pulse: 73  Resp: 18  Temp: (!) 97.5 F (36.4 C)  TempSrc: Temporal  SpO2: 96%  Weight: 116 lb 1.9 oz (52.7 kg)  Height: 5' 3"  (1.6 m)  PainSc: 0-No pain   Body mass index is 20.57 kg/m.  Filed Weights   10/17/19 1007  Weight: 116 lb 1.9 oz (52.7 kg)    GENERAL: alert, no distress and comfortable, slightly frail appearing SKIN: skin color, texture, turgor are normal, no rashes or significant lesions EYES: conjunctiva are pink and non-injected, sclera clear NECK: supple, non-tender LUNGS: clear to auscultation with normal breathing effort HEART: regular rate & rhythm and no  murmurs and no lower extremity edema ABDOMEN: soft, non-tender, non-distended, normal bowel sounds Musculoskeletal: no cyanosis of digits and no clubbing  PSYCH: alert & oriented x 3, fluent speech  LABORATORY DATA:  I have reviewed the data as listed    Component Value Date/Time   NA 138 10/17/2019 0939   K 5.1 10/17/2019 0939   CL 98 10/17/2019 0939   CO2 34 (H) 10/17/2019 0939   GLUCOSE 112 (H) 10/17/2019 0939   BUN 19 10/17/2019 0939   CREATININE 0.92 10/17/2019 0939   CALCIUM 9.6 10/17/2019 0939   PROT 6.8 10/17/2019 0939   ALBUMIN 4.5 10/17/2019 0939   AST 27 10/17/2019 0939   ALT 20 10/17/2019 0939   ALKPHOS 46 10/17/2019 0939   BILITOT 0.5 10/17/2019 0939   GFRNONAA >60 10/17/2019 0939   GFRAA >60 10/17/2019 0939    No results found for: SPEP, UPEP  Lab Results  Component Value Date   WBC 7.0 10/17/2019   NEUTROABS 4.4 10/17/2019   HGB 12.0 10/17/2019   HCT 36.9 10/17/2019   MCV 93.9 10/17/2019   PLT 450 (H) 10/17/2019      Chemistry      Component Value Date/Time    NA 138 10/17/2019 0939   K 5.1 10/17/2019 0939   CL 98 10/17/2019 0939   CO2 34 (H) 10/17/2019 0939   BUN 19 10/17/2019 0939   CREATININE 0.92 10/17/2019 0939      Component Value Date/Time   CALCIUM 9.6 10/17/2019 0939   ALKPHOS 46 10/17/2019 0939   AST 27 10/17/2019 0939   ALT 20 10/17/2019 0939   BILITOT 0.5 10/17/2019 0939       RADIOGRAPHIC STUDIES: I have personally reviewed the radiological images as listed below and agreed with the findings in the report. CT CARDIAC SCORING  Addendum Date: 10/07/2019   ADDENDUM REPORT: 10/07/2019 14:57 CLINICAL DATA:  Risk stratification EXAM: Coronary Calcium Score TECHNIQUE: The patient was scanned on a Siemens Somatom 64 slice scanner. Axial non-contrast 3 mm slices were carried out through the heart. The data set was analyzed on a dedicated work station and scored using the [place]. FINDINGS: Non-cardiac: See separate report from [facility]. Ascending aorta: Normal size; aortic atherosclerosis noted Pericardium: Normal Coronary arteries: Normal origin IMPRESSION: Coronary calcium score of 678. This was 58 percentile for age and sex matched control. Kirk Ruths Electronically Signed   By: Kirk Ruths M.D.   On: 10/07/2019 14:57   Result Date: 10/07/2019 EXAM: OVER-READ INTERPRETATION  CT CHEST The following report is an over-read performed by radiologist Dr. Aletta Edouard of [facility], [place] on 10/07/2019. This over-read does not include interpretation of cardiac or coronary anatomy or pathology. The coronary calcium score interpretation by the cardiologist is attached. COMPARISON:  CT of the chest without contrast on 06/28/2019 FINDINGS: Vascular: Calcified plaque in the visualized thoracic aorta without visible aneurysmal dilatation. Mediastinum/Nodes: No visualized lymphadenopathy or masses in the mediastinum or hilar regions. Lungs/Pleura: Stable chronic lung disease and scattered areas of scarring bilaterally. No  pneumothorax, pleural fluid or edema. Upper Abdomen: No acute abnormality. Musculoskeletal: No chest wall mass or suspicious bone lesions identified. IMPRESSION: Atherosclerosis of the thoracic aorta. Electronically Signed: By: Aletta Edouard M.D. On: 10/07/2019 13:39

## 2019-10-19 ENCOUNTER — Telehealth (HOSPITAL_COMMUNITY): Payer: Self-pay

## 2019-10-19 NOTE — Telephone Encounter (Signed)
Spoke with the patient, instructions given. She stated that she understood and would be here for her test. Asked her to call back with any questions. Cheryl Haley EMTP

## 2019-10-25 ENCOUNTER — Ambulatory Visit (HOSPITAL_COMMUNITY): Payer: Medicare Other | Attending: Cardiovascular Disease

## 2019-10-25 ENCOUNTER — Other Ambulatory Visit: Payer: Self-pay

## 2019-10-25 DIAGNOSIS — R0609 Other forms of dyspnea: Secondary | ICD-10-CM

## 2019-10-25 DIAGNOSIS — I491 Atrial premature depolarization: Secondary | ICD-10-CM | POA: Diagnosis not present

## 2019-10-25 DIAGNOSIS — F17200 Nicotine dependence, unspecified, uncomplicated: Secondary | ICD-10-CM | POA: Insufficient documentation

## 2019-10-25 DIAGNOSIS — I10 Essential (primary) hypertension: Secondary | ICD-10-CM | POA: Diagnosis not present

## 2019-10-25 DIAGNOSIS — R002 Palpitations: Secondary | ICD-10-CM | POA: Diagnosis not present

## 2019-10-25 DIAGNOSIS — E7800 Pure hypercholesterolemia, unspecified: Secondary | ICD-10-CM | POA: Diagnosis not present

## 2019-10-25 LAB — MYOCARDIAL PERFUSION IMAGING
LV dias vol: 74 mL (ref 46–106)
LV sys vol: 27 mL
Peak HR: 105 {beats}/min
Rest HR: 71 {beats}/min
SDS: 3
SRS: 3
SSS: 6
TID: 1.12

## 2019-10-25 MED ORDER — REGADENOSON 0.4 MG/5ML IV SOLN
0.4000 mg | Freq: Once | INTRAVENOUS | Status: AC
  Administered 2019-10-25: 0.4 mg via INTRAVENOUS

## 2019-10-25 MED ORDER — TECHNETIUM TC 99M TETROFOSMIN IV KIT
10.1000 | PACK | Freq: Once | INTRAVENOUS | Status: AC | PRN
  Administered 2019-10-25: 10.1 via INTRAVENOUS
  Filled 2019-10-25: qty 11

## 2019-10-25 MED ORDER — TECHNETIUM TC 99M TETROFOSMIN IV KIT
32.2000 | PACK | Freq: Once | INTRAVENOUS | Status: AC | PRN
  Administered 2019-10-25: 32.2 via INTRAVENOUS
  Filled 2019-10-25: qty 33

## 2019-10-28 ENCOUNTER — Other Ambulatory Visit: Payer: Self-pay | Admitting: Primary Care

## 2019-11-15 ENCOUNTER — Inpatient Hospital Stay: Payer: Medicare Other | Attending: Hematology

## 2019-11-15 ENCOUNTER — Other Ambulatory Visit: Payer: Self-pay

## 2019-11-15 ENCOUNTER — Inpatient Hospital Stay (HOSPITAL_BASED_OUTPATIENT_CLINIC_OR_DEPARTMENT_OTHER): Payer: Medicare Other | Admitting: Hematology

## 2019-11-15 ENCOUNTER — Encounter: Payer: Self-pay | Admitting: Hematology

## 2019-11-15 ENCOUNTER — Telehealth: Payer: Self-pay | Admitting: Cardiology

## 2019-11-15 VITALS — BP 175/61 | HR 82 | Temp 97.8°F | Resp 19 | Ht 63.0 in | Wt 114.8 lb

## 2019-11-15 DIAGNOSIS — D473 Essential (hemorrhagic) thrombocythemia: Secondary | ICD-10-CM

## 2019-11-15 DIAGNOSIS — Z79899 Other long term (current) drug therapy: Secondary | ICD-10-CM | POA: Insufficient documentation

## 2019-11-15 LAB — CBC WITH DIFFERENTIAL (CANCER CENTER ONLY)
Abs Immature Granulocytes: 0.01 10*3/uL (ref 0.00–0.07)
Basophils Absolute: 0.1 10*3/uL (ref 0.0–0.1)
Basophils Relative: 1 %
Eosinophils Absolute: 0.1 10*3/uL (ref 0.0–0.5)
Eosinophils Relative: 2 %
HCT: 37.8 % (ref 36.0–46.0)
Hemoglobin: 12.2 g/dL (ref 12.0–15.0)
Immature Granulocytes: 0 %
Lymphocytes Relative: 28 %
Lymphs Abs: 1.6 10*3/uL (ref 0.7–4.0)
MCH: 30.7 pg (ref 26.0–34.0)
MCHC: 32.3 g/dL (ref 30.0–36.0)
MCV: 95 fL (ref 80.0–100.0)
Monocytes Absolute: 0.5 10*3/uL (ref 0.1–1.0)
Monocytes Relative: 8 %
Neutro Abs: 3.5 10*3/uL (ref 1.7–7.7)
Neutrophils Relative %: 61 %
Platelet Count: 398 10*3/uL (ref 150–400)
RBC: 3.98 MIL/uL (ref 3.87–5.11)
RDW: 14.7 % (ref 11.5–15.5)
WBC Count: 5.7 10*3/uL (ref 4.0–10.5)
nRBC: 0 % (ref 0.0–0.2)

## 2019-11-15 LAB — CMP (CANCER CENTER ONLY)
ALT: 16 U/L (ref 0–44)
AST: 24 U/L (ref 15–41)
Albumin: 4.6 g/dL (ref 3.5–5.0)
Alkaline Phosphatase: 49 U/L (ref 38–126)
Anion gap: 4 — ABNORMAL LOW (ref 5–15)
BUN: 16 mg/dL (ref 8–23)
CO2: 36 mmol/L — ABNORMAL HIGH (ref 22–32)
Calcium: 9.8 mg/dL (ref 8.9–10.3)
Chloride: 97 mmol/L — ABNORMAL LOW (ref 98–111)
Creatinine: 0.78 mg/dL (ref 0.44–1.00)
GFR, Est AFR Am: >60 mL/min (ref >60)
GFR, Estimated: >60 mL/min (ref >60)
Glucose, Bld: 112 mg/dL — ABNORMAL HIGH (ref 70–99)
Potassium: 4.6 mmol/L (ref 3.5–5.1)
Sodium: 137 mmol/L (ref 135–145)
Total Bilirubin: 0.5 mg/dL (ref 0.3–1.2)
Total Protein: 7.3 g/dL (ref 6.5–8.1)

## 2019-11-15 LAB — LACTATE DEHYDROGENASE: LDH: 221 U/L — ABNORMAL HIGH (ref 98–192)

## 2019-11-15 NOTE — Telephone Encounter (Signed)
The patient has been notified of the result and verbalized understanding.  All questions (if any) were answered. Patient is not having any further palpitations.  Antonieta Iba, RN 11/15/2019 9:16 AM

## 2019-11-15 NOTE — Telephone Encounter (Signed)
Follow up:     Patient returning your call from yesterday concering some results. Please call patient back.

## 2019-11-15 NOTE — Progress Notes (Signed)
[place] OFFICE PROGRESS NOTE  Patient Care Team: [name], MD as PCP - General (Family Medicine) Sueanne Margarita, MD as PCP - Cardiology (Cardiology)  HEME/ONC OVERVIEW: 1. Essential thrombocythemia, JAK2 V617F+; high risk by IPSET  -Previous patient of Dr. Walden Field  -Chronic, mild thrombocytosis (plts 400-500k) since 2018   JAK2 V617F mutation positive; MPL and CAL-R negative   Slightly hypercellular marrow for age with atypical megakaryocytic proliferation, consistent with DVT -10/2019 - present: Hydrea   2. Incidental pulmonary nodules  -07/2018: RUL (38mm) and RLL (37mm) nodules on CT chest -06/2019: improving/stable bilateral pulmonary nodules, likely infection vs inflammation  -Followed by pulmonology   TREATMENT SUMMARY:  ASA 81mg  daily  10/2019 - present: Hydrea, currently 500mg  q2days   PERTINENT NON-HEM/ONC PROBLEMS: 1. Advanced COPD on supplemental O2   ASSESSMENT & PLAN:   Essential thrombocythemia, high risk by IPSET  -Currently on Hydrea 500mg  q2days; patient is tolerating it well without significant side effects  -Plts 398k today, improving -Given the patient's frailty and multiple other comorbidities, as well as her relatively mild thrombocytosis at baseline, we will continue Hydrea 500mg  q2days  -Goal plt count < 400k  -Continue daily ASA   -I counseled the patient on some of the concerning symptoms, including but not limited to, persistent chest pain, rapidly worsening dyspnea, hemoptysis, or unilateral lower extremity swelling/pain, for which she is instructed to seek further evaluation   Orders Placed This Encounter  Procedures  . CBC with Differential (Cancer Center Only)    Standing Status:   Standing    Number of Occurrences:   20    Standing Expiration Date:   11/14/2020  . CMP ([facility] only)    Standing Status:   Standing    Number of Occurrences:   20    Standing Expiration Date:   11/14/2020  . Lactate dehydrogenase     Standing Status:   Standing    Number of Occurrences:   20    Standing Expiration Date:   11/14/2020   The total time spent in the encounter was 31 minutes, including face-to-face time with the patient, review of various tests results, order additional studies/medications, documentation, and coordination of care plan.   All questions were answered. The patient knows to call the clinic with any problems, questions or concerns. No barriers to learning was detected.  Return in 2 months for labs and clinic follow-up.   Cheryl Men, MD 3/16/202111:49 AM  CHIEF COMPLAINT: "I am doing okay"  INTERVAL HISTORY: Cheryl Haley returns to clinic for follow-up of high-risk ET on Hydrea.  Patient reports that she has been tolerating Hydrea well without significant side effects, such as nausea, rash, or diarrhea.  She usually takes her Hydrea at night.  Due to her q2day schedule, she occasionally forgets if she has taken her dose the day prior, and therefore she has missed 1 or 2 doses, but overall she is compliant with her medication.  She reports that her recent Holter monitor was unremarkable, and she has not had any recurrent palpitations.  She denies any other new complaint today.  REVIEW OF SYSTEMS:   Constitutional: ( - ) fevers, ( - )  chills , ( - ) night sweats Eyes: ( - ) blurriness of vision, ( - ) double vision, ( - ) watery eyes Ears, nose, mouth, throat, and face: ( - ) mucositis, ( - ) sore throat Respiratory: ( - ) cough, ( + ) chronic stable dyspnea, ( - )  wheezes Cardiovascular: ( - ) palpitation, ( - ) chest discomfort, ( - ) lower extremity swelling Gastrointestinal:  ( - ) nausea, ( - ) heartburn, ( - ) change in bowel habits Skin: ( - ) abnormal skin rashes Lymphatics: ( - ) new lymphadenopathy, ( - ) easy bruising Neurological: ( - ) numbness, ( - ) tingling, ( - ) new weaknesses Behavioral/Psych: ( - ) mood change, ( - ) new changes  All other systems were reviewed with the  patient and are negative.  SUMMARY OF ONCOLOGIC HISTORY: Oncology History   No history exists.    I have reviewed the past medical history, past surgical history, social history and family history with the patient and they are unchanged from previous note.  ALLERGIES:  has No Known Allergies.  MEDICATIONS:  Current Outpatient Medications  Medication Sig Dispense Refill  . aspirin EC 81 MG tablet Take 81 mg daily by mouth.     . diltiazem (CARDIZEM) 60 MG tablet Take 1 tablet (60 mg total) by mouth 2 (two) times daily. 60 tablet 0  . EUTHYROX 75 MCG tablet Take 75 mcg by mouth daily.    Arna Medici 88 MCG tablet Take by mouth.    . Fluticasone-Umeclidin-Vilant (TRELEGY ELLIPTA) 100-62.5-25 MCG/INH AEPB Inhale 1 puff into the lungs daily. 1 each 0  . hydroxyurea (HYDREA) 500 MG capsule Take 1 capsule (500 mg total) by mouth every other day. May take with food to minimize GI side effects. 15 capsule 5  . ipratropium-albuterol (DUONEB) 0.5-2.5 (3) MG/3ML SOLN Take 3 mLs by nebulization.    Marland Kitchen levothyroxine (SYNTHROID, LEVOTHROID) 100 MCG tablet Take 100 mcg daily before breakfast by mouth.    . losartan (COZAAR) 50 MG tablet Take 50 mg by mouth daily.    . metoprolol succinate (TOPROL-XL) 25 MG 24 hr tablet Take 25 mg by mouth daily.    . Multiple Vitamin (MULTIVITAMIN) tablet Take 1 tablet by mouth daily.    . prochlorperazine (COMPAZINE) 10 MG tablet Take 1 tablet (10 mg total) by mouth every 8 (eight) hours as needed for nausea or vomiting. 30 tablet 5  . rosuvastatin (CRESTOR) 10 MG tablet Take 1 tablet (10 mg total) by mouth daily. 90 tablet 3  . SPIRIVA HANDIHALER 18 MCG inhalation capsule PLACE 1 CAPSULE INTO HANDINHALER AND INHALE THE CONTENTS ONCE DAILY 30 capsule 0  . SYMBICORT 160-4.5 MCG/ACT inhaler Inhale 2 puffs into the lungs 2 (two) times daily. 1 Inhaler 11   No current facility-administered medications for this visit.    PHYSICAL EXAMINATION: ECOG PERFORMANCE STATUS: 2 -  Symptomatic, <50% confined to bed  Today's Vitals   11/15/19 1110  BP: (!) 175/61  Pulse: 82  Resp: 19  Temp: 97.8 F (36.6 C)  TempSrc: Temporal  SpO2: 98%  Weight: 114 lb 12.8 oz (52.1 kg)  Height: 5\' 3"  (1.6 m)  PainSc: 0-No pain   Body mass index is 20.34 kg/m.  Filed Weights   11/15/19 1110  Weight: 114 lb 12.8 oz (52.1 kg)    GENERAL: alert, no distress and comfortable, frail appearing  SKIN: skin color, texture, turgor are normal, no rashes or significant lesions EYES: conjunctiva are pink and non-injected, sclera clear OROPHARYNX: no exudate, no erythema; lips, buccal mucosa, and tongue normal  NECK: supple, non-tender LUNGS: clear to auscultation with normal breathing effort HEART: regular rate & rhythm and no murmurs and no lower extremity edema ABDOMEN: soft, non-tender, non-distended, normal bowel sounds Musculoskeletal: no cyanosis  of digits and no clubbing  PSYCH: alert & oriented x 3, fluent speech  LABORATORY DATA:  I have reviewed the data as listed    Component Value Date/Time   NA 137 11/15/2019 1040   K 4.6 11/15/2019 1040   CL 97 (L) 11/15/2019 1040   CO2 36 (H) 11/15/2019 1040   GLUCOSE 112 (H) 11/15/2019 1040   BUN 16 11/15/2019 1040   CREATININE 0.78 11/15/2019 1040   CALCIUM 9.8 11/15/2019 1040   PROT 7.3 11/15/2019 1040   ALBUMIN 4.6 11/15/2019 1040   AST 24 11/15/2019 1040   ALT 16 11/15/2019 1040   ALKPHOS 49 11/15/2019 1040   BILITOT 0.5 11/15/2019 1040   GFRNONAA >60 11/15/2019 1040   GFRAA >60 11/15/2019 1040    No results found for: SPEP, UPEP  Lab Results  Component Value Date   WBC 5.7 11/15/2019   NEUTROABS 3.5 11/15/2019   HGB 12.2 11/15/2019   HCT 37.8 11/15/2019   MCV 95.0 11/15/2019   PLT 398 11/15/2019      Chemistry      Component Value Date/Time   NA 137 11/15/2019 1040   K 4.6 11/15/2019 1040   CL 97 (L) 11/15/2019 1040   CO2 36 (H) 11/15/2019 1040   BUN 16 11/15/2019 1040   CREATININE 0.78  11/15/2019 1040      Component Value Date/Time   CALCIUM 9.8 11/15/2019 1040   ALKPHOS 49 11/15/2019 1040   AST 24 11/15/2019 1040   ALT 16 11/15/2019 1040   BILITOT 0.5 11/15/2019 1040       RADIOGRAPHIC STUDIES: I have personally reviewed the radiological images as listed below and agreed with the findings in the report. Cardiac event monitor  Result Date: 11/10/2019  Sinus bradycardia, normal sinus rhythm and sinus tachycardia. The average heart rate was 67bpm and ranged from 51 to 105bpm.    MYOCARDIAL PERFUSION IMAGING  Result Date: 10/25/2019  Nuclear stress EF: 63%.  There was no ST segment deviation noted during stress.  The study is normal.  This is a low risk study.  The left ventricular ejection fraction is normal (55-65%).  No ischemia.

## 2019-11-22 ENCOUNTER — Other Ambulatory Visit: Payer: Medicare Other

## 2019-11-22 ENCOUNTER — Other Ambulatory Visit: Payer: Self-pay

## 2019-11-22 DIAGNOSIS — E7800 Pure hypercholesterolemia, unspecified: Secondary | ICD-10-CM

## 2019-11-22 DIAGNOSIS — R002 Palpitations: Secondary | ICD-10-CM

## 2019-11-22 LAB — LIPID PANEL
Chol/HDL Ratio: 2.4 ratio (ref 0.0–4.4)
Cholesterol, Total: 155 mg/dL (ref 100–199)
HDL: 64 mg/dL (ref >39)
LDL Chol Calc (NIH): 81 mg/dL (ref 0–99)
Triglycerides: 43 mg/dL (ref 0–149)
VLDL Cholesterol Cal: 10 mg/dL (ref 5–40)

## 2019-11-22 LAB — ALT: ALT: 18 IU/L (ref 0–32)

## 2019-11-23 ENCOUNTER — Other Ambulatory Visit: Payer: Self-pay | Admitting: Primary Care

## 2019-11-25 ENCOUNTER — Telehealth: Payer: Self-pay | Admitting: Cardiology

## 2019-11-25 ENCOUNTER — Telehealth: Payer: Self-pay

## 2019-11-25 DIAGNOSIS — E7800 Pure hypercholesterolemia, unspecified: Secondary | ICD-10-CM

## 2019-11-25 MED ORDER — ROSUVASTATIN CALCIUM 20 MG PO TABS: 20.0000 mg | ORAL_TABLET | Freq: Every day | ORAL | 3 refills | Status: DC

## 2019-11-25 NOTE — Telephone Encounter (Signed)
New message:     Patient calling to get the results for her lab work. Please call patient back.

## 2019-11-25 NOTE — Telephone Encounter (Signed)
-----   Message from Sueanne Margarita, MD sent at 11/22/2019  6:41 PM EDT ----- LDL still not at goal of < 70 - increase Crestor to 20mg  daily and repeat FLP and ALT in 6 weeks

## 2019-11-25 NOTE — Telephone Encounter (Signed)
Left message to review MyChart message and to call back with any questions.

## 2019-12-06 ENCOUNTER — Other Ambulatory Visit: Payer: Self-pay

## 2019-12-06 ENCOUNTER — Ambulatory Visit (INDEPENDENT_AMBULATORY_CARE_PROVIDER_SITE_OTHER): Payer: Medicare Other | Admitting: Critical Care Medicine

## 2019-12-06 ENCOUNTER — Encounter: Payer: Self-pay | Admitting: Critical Care Medicine

## 2019-12-06 VITALS — BP 130/70 | HR 75 | Temp 98.2°F | Ht 64.0 in | Wt 114.6 lb

## 2019-12-06 DIAGNOSIS — J9611 Chronic respiratory failure with hypoxia: Secondary | ICD-10-CM | POA: Diagnosis not present

## 2019-12-06 DIAGNOSIS — J9612 Chronic respiratory failure with hypercapnia: Secondary | ICD-10-CM

## 2019-12-06 DIAGNOSIS — J439 Emphysema, unspecified: Secondary | ICD-10-CM

## 2019-12-06 DIAGNOSIS — R918 Other nonspecific abnormal finding of lung field: Secondary | ICD-10-CM

## 2019-12-06 MED ORDER — MOMETASONE FURO-FORMOTEROL FUM 100-5 MCG/ACT IN AERO: 2.0000 | INHALATION_SPRAY | Freq: Two times a day (BID) | RESPIRATORY_TRACT | 11 refills | Status: DC

## 2019-12-06 NOTE — Patient Instructions (Addendum)
Thank you for visiting Dr. Carlis Abbott at [facility]. We recommend the following: Orders Placed This Encounter  Procedures  . CT Chest Wo Contrast   Orders Placed This Encounter  Procedures  . CT Chest Wo Contrast    October  2021    Standing Status:   Future    Standing Expiration Date:   02/04/2021    Order Specific Question:   ** REASON FOR EXAM (FREE TEXT)    Answer:   pulm nodule follow up    Order Specific Question:   Preferred imaging location?    Answer:   [place]    Order Specific Question:   Radiology Contrast Protocol - do NOT remove file path    Answer:   [email or web address]    Meds ordered this encounter  Medications  . mometasone-formoterol (DULERA) 100-5 MCG/ACT AERO    Sig: Inhale 2 puffs into the lungs 2 (two) times daily.    Dispense:  13 g    Refill:  11    Return in about 6 months (around 06/06/2020).    Please do your part to reduce the spread of COVID-19.

## 2019-12-06 NOTE — Progress Notes (Signed)
Synopsis: Referred in 2015 for COPD by [name], MD. Formerly a patient of [name].  Subjective:   PATIENT ID: Cheryl Haley GENDER: female DOB: 12/25/1955, MRN: [id number]  Chief Complaint  Patient presents with  . Follow-up    Patient states that she feels better since last visit in Jan. Patient is on 3 liters with exertion and on 2 liters at home with concentrator. Patient states that last night she coughed up some phelgm and noticed it had some blood in it. She has also noticed blood when blowing her nose so isnt sure if its from her lungs or sinusesl    Cheryl Haley is a 64 year old woman with a history of COPD and chronic respiratory failure on home oxygen who presents for follow-up.  She had an exacerbation requiring hospitalization in January 2020, but has not been hospitalized for COPD otherwise.  Since that exacerbation she has not required antibiotics or prednisone.  She continues on Spiriva once daily and Symbicort 2 puffs twice daily.  She reports that she runs out her Symbicort early.  She just felt an inhaler on 3/25 and reports that she already is less than 20 puffs.  She is spoken to her pharmacist about this.  She has no significant cough or sputum production, wheezing.  Her dyspnea on exertion is at baseline.  She walks slower than her peers and can only walk less than 1 block.  She does not use albuterol due to lack of perceived benefit.  She is able to complete her daily activities.  She is up-to-date on pneumonia and flu vaccines.  She uses 3 L pulsed oxygen with her Elberta Spaniel and is on 2 L continuous at home. Trelegy was previously prescribed in January, which her insurance did not cover.  Former tobacco abuse before quitting in 2018, 80-pack-year history.    Past Medical History:  Diagnosis Date  . Anxiety   . 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 COPD (chronic obstructive pulmonary disease) ([place])   . Emphysema lung ([place])   . Essential thrombocythemia ([place]) 09/15/2019   . History of hiatal hernia   . Hx of TIA (transient ischemic attack) and stroke   . Hyperlipidemia   . Hypertension   . Hypothyroidism   . Impaired fasting glucose   . Pleural effusion 05/15/2014   CXR 05/2014:  New small right effusion.    . Pneumonia   . Protein-calorie malnutrition, severe 09/17/2018  . Pulmonary nodules 07/14/2018   07/2017 CT chest >pulmonary nodules noted.  07/2017 PET scan -Hypermetabolic LUL nodule  [id number] CT chest >decreased nodule size, new pulmonary nodules noted.  FOB -atypical cells  CT chest 07/2018 >stable nodules to smaller on established nodules . 2 new nodules RUL , RLL >CT chest 3 months   . Thyroid disease   . Tobacco abuse 11/28/2015   Smoked 2 packs of cigarettes daily from teenage years until age 89, quit in 2016   . Unspecified hypothyroidism 09/20/2013  . Viral respiratory infection 09/16/2018     Family History  Problem Relation Age of Onset  . Emphysema Father   . Heart disease Father   . Emphysema Brother   . Emphysema Sister   . Emphysema Sister   . Heart disease Mother   . Heart disease Sister   . Heart disease Brother   . Heart disease Brother   . Congestive Heart Failure Sister   . Congestive Heart Failure Sister   . Cancer Sister  lung  . Cancer Sister        breast     Past Surgical History:  Procedure Laterality Date  . ESOPHAGOGASTRODUODENOSCOPY    . VIDEO BRONCHOSCOPY WITH ENDOBRONCHIAL NAVIGATION Left 07/29/2017   Procedure: VIDEO BRONCHOSCOPY WITH ENDOBRONCHIAL NAVIGATION;  Surgeon: Collene Gobble, MD;  Location: [facility] OR;  Service: Thoracic;  Laterality: Left;    Social History   Socioeconomic History  . Marital status: Divorced    Spouse name: Not on file  . Number of children: Not on file  . Years of education: Not on file  . Highest education level: Not on file  Occupational History  . Not on file  Tobacco Use  . Smoking status: Former Smoker    Packs/day: 2.00    Years: 40.00    Pack years: 80.00     Types: Cigarettes    Quit date: 03/16/2017    Years since quitting: 2.7  . Smokeless tobacco: Never Used  . Tobacco comment:    Substance and Sexual Activity  . Alcohol use: Yes    Alcohol/week: 14.0 standard drinks    Types: 14 Cans of beer per week  . Drug use: No  . Sexual activity: Not on file  Other Topics Concern  . Not on file  Social History Narrative  . Not on file   Social Determinants of Health   Financial Resource Strain:   . Difficulty of Paying Living Expenses:   Food Insecurity:   . Worried About Charity fundraiser in the Last Year:   . Arboriculturist in the Last Year:   Transportation Needs:   . Film/video editor (Medical):   Marland Kitchen Lack of Transportation (Non-Medical):   Physical Activity:   . Days of Exercise per Week:   . Minutes of Exercise per Session:   Stress:   . Feeling of Stress :   Social Connections:   . Frequency of Communication with Friends and Family:   . Frequency of Social Gatherings with Friends and Family:   . Attends Religious Services:   . Active Member of Clubs or Organizations:   . Attends Archivist Meetings:   Marland Kitchen Marital Status:   Intimate Partner Violence:   . Fear of Current or Ex-Partner:   . Emotionally Abused:   Marland Kitchen Physically Abused:   . Sexually Abused:      No Known Allergies   Immunization History  Administered Date(s) Administered  . Influenza Split 05/31/2015  . Influenza,inj,Quad PF,6+ Mos 09/22/2013, 06/10/2016, 06/25/2017, 04/30/2018, 06/01/2019  . Pneumococcal Conjugate-13 04/17/2015  . Pneumococcal Polysaccharide-23 12/08/2016    Outpatient Medications Prior to Visit  Medication Sig Dispense Refill  . aspirin EC 81 MG tablet Take 81 mg daily by mouth.     . diltiazem (CARDIZEM) 60 MG tablet Take 1 tablet (60 mg total) by mouth 2 (two) times daily. 60 tablet 0  . EUTHYROX 75 MCG tablet Take 75 mcg by mouth daily.    Arna Medici 88 MCG tablet Take by mouth.    . levothyroxine (SYNTHROID,  LEVOTHROID) 100 MCG tablet Take 100 mcg daily before breakfast by mouth.    . losartan (COZAAR) 50 MG tablet Take 50 mg by mouth daily.    . metoprolol succinate (TOPROL-XL) 25 MG 24 hr tablet Take 25 mg by mouth daily.    . Multiple Vitamin (MULTIVITAMIN) tablet Take 1 tablet by mouth daily.    . prochlorperazine (COMPAZINE) 10 MG tablet Take 1 tablet (10 mg  total) by mouth every 8 (eight) hours as needed for nausea or vomiting. 30 tablet 5  . rosuvastatin (CRESTOR) 20 MG tablet Take 1 tablet (20 mg total) by mouth daily. Dose increased. 90 tablet 3  . SPIRIVA HANDIHALER 18 MCG inhalation capsule PLACE 1 CAPSULE INTO HANDINHALER AND INHALE THE CONTENTS ONCE A DAY. MUST KEEP APPOINTMENT ON 12/06/19 FOR MORE REFILLS. 30 capsule 0  . SYMBICORT 160-4.5 MCG/ACT inhaler Inhale 2 puffs into the lungs 2 (two) times daily. 1 Inhaler 11  . ipratropium-albuterol (DUONEB) 0.5-2.5 (3) MG/3ML SOLN Take 3 mLs by nebulization.    . Fluticasone-Umeclidin-Vilant (TRELEGY ELLIPTA) 100-62.5-25 MCG/INH AEPB Inhale 1 puff into the lungs daily. 1 each 0   No facility-administered medications prior to visit.    Review of Systems  Constitutional: Negative.   HENT: Positive for congestion and nosebleeds.   Respiratory: Negative for cough, sputum production, shortness of breath and wheezing.   Cardiovascular: Negative.   Neurological: Negative for weakness.  Endo/Heme/Allergies: Negative for environmental allergies.     Objective:   Vitals:   12/06/19 1101  BP: 130/70  Pulse: 75  Temp: 98.2 F (36.8 C)  TempSrc: Temporal  SpO2: 92%  Weight: 114 lb 9.6 oz (52 kg)  Height: 5\' 4"  (1.626 m)   92% on 3 LPM pulsed  BMI Readings from Last 3 Encounters:  12/06/19 19.67 kg/m  10/25/19 20.55 kg/m  09/20/19 19.71 kg/m   Wt Readings from Last 3 Encounters:  12/06/19 114 lb 9.6 oz (52 kg)  10/25/19 116 lb (52.6 kg)  09/20/19 114 lb 12.8 oz (52.1 kg)    Physical Exam Vitals reviewed.  Constitutional:       Appearance: She is not ill-appearing.     Comments: Frail appearing elderly woman in no acute distress  HENT:     Head: Normocephalic and atraumatic.  Eyes:     General: No scleral icterus. Cardiovascular:     Rate and Rhythm: Normal rate and regular rhythm.     Heart sounds: No murmur.  Pulmonary:     Comments: Breathing comfortably on 3 L pulsed oxygen.  Distant breath sounds, prolonged exhalation.  Clear to auscultation bilaterally.  Speaking in full sentences. Abdominal:     General: There is no distension.     Palpations: Abdomen is soft.     Tenderness: There is no abdominal tenderness.  Musculoskeletal:        General: No swelling or deformity.     Cervical back: Neck supple.  Lymphadenopathy:     Cervical: No cervical adenopathy.  Skin:    General: Skin is warm and dry.     Findings: No rash.  Neurological:     General: No focal deficit present.     Mental Status: She is alert.     Coordination: Coordination normal.  Psychiatric:        Mood and Affect: Mood normal.        Behavior: Behavior normal.      CBC    Component Value Date/Time   WBC 5.7 11/15/2019 1040   WBC 12.7 (H) 09/23/2018 1119   RBC 3.98 11/15/2019 1040   HGB 12.2 11/15/2019 1040   HCT 37.8 11/15/2019 1040   PLT 398 11/15/2019 1040   MCV 95.0 11/15/2019 1040   MCH 30.7 11/15/2019 1040   MCHC 32.3 11/15/2019 1040   RDW 14.7 11/15/2019 1040   LYMPHSABS 1.6 11/15/2019 1040   MONOABS 0.5 11/15/2019 1040   EOSABS 0.1 11/15/2019 1040  BASOSABS 0.1 11/15/2019 1040    CHEMISTRY No results for input(s): NA, K, CL, CO2, GLUCOSE, BUN, CREATININE, CALCIUM, MG, PHOS in the last 168 hours. CrCl cannot be calculated (Patient's most recent lab result is older than the maximum 21 days allowed.).   Chest Imaging- films reviewed: CT chest 06/28/2019- severe centrilobular emphysema throughout both lungs.  Left upper lobe spiculated nodule associated with linear scar.  Right middle lobe spiculated  nodule.  Airway thickening.  Scarring in right greater than left lower lobes.  No significant mediastinal or hilar adenopathy.  Nodules stable or smaller than previous CT scans.  CT cardiac scoring 10/07/2019- redemonstrated right middle lobe nodule.  No new concerning nodules.  Pulmonary Functions Testing Results: No flowsheet data found.   Spirometry 2015: FVC  2.0 (63%) FEV1  0.8 (33%) Ratio 41  10/25/2019 SPECT scan: EF 63%, low risk study.  No EKG changes during stress.       Assessment & Plan:     ICD-10-CM   1. Pulmonary nodules  R91.8 CT Chest Wo Contrast  2. Pulmonary emphysema, unspecified emphysema type ([place])  J43.9 CT Chest Wo Contrast  3. Chronic respiratory failure with hypoxia and hypercapnia ([facility])  J96.11    J96.12     COPD; GOLD B -Given concern of running out of Symbicort quickly, will transition to [facility] 2 puffs twice daily.  She has been instructed to use it as prescribed and not more frequently than normal -Continue Spiriva once daily. -Albuterol as needed -Continue regular physical activity as tolerated. -Up-to-date on pneumococcal and seasonal flu vaccines.  Recommend Covid vaccine and recommended that she contact the health department in [facility] where she lives to make an appointment.  Chronic hypoxic and hypercapnic respiratory  failure due to COPD -Continue supplemental oxygen as required to maintain saturations greater than 88%.  3 L pulsed with activity, 2 L continuous at home.  She has a portable oxygen concentrator.  Pulmonary nodules -Follow-up CT chest ordered  RTC in 6 months.    Current Outpatient Medications:  .  aspirin EC 81 MG tablet, Take 81 mg daily by mouth. , Disp: , Rfl:  .  diltiazem (CARDIZEM) 60 MG tablet, Take 1 tablet (60 mg total) by mouth 2 (two) times daily., Disp: 60 tablet, Rfl: 0 .  EUTHYROX 75 MCG tablet, Take 75 mcg by mouth daily., Disp: , Rfl:  .  EUTHYROX 88 MCG tablet, Take by mouth., Disp: , Rfl:  .   levothyroxine (SYNTHROID, LEVOTHROID) 100 MCG tablet, Take 100 mcg daily before breakfast by mouth., Disp: , Rfl:  .  losartan (COZAAR) 50 MG tablet, Take 50 mg by mouth daily., Disp: , Rfl:  .  metoprolol succinate (TOPROL-XL) 25 MG 24 hr tablet, Take 25 mg by mouth daily., Disp: , Rfl:  .  Multiple Vitamin (MULTIVITAMIN) tablet, Take 1 tablet by mouth daily., Disp: , Rfl:  .  prochlorperazine (COMPAZINE) 10 MG tablet, Take 1 tablet (10 mg total) by mouth every 8 (eight) hours as needed for nausea or vomiting., Disp: 30 tablet, Rfl: 5 .  rosuvastatin (CRESTOR) 20 MG tablet, Take 1 tablet (20 mg total) by mouth daily. Dose increased., Disp: 90 tablet, Rfl: 3 .  SPIRIVA HANDIHALER 18 MCG inhalation capsule, PLACE 1 CAPSULE INTO HANDINHALER AND INHALE THE CONTENTS ONCE A DAY. MUST KEEP APPOINTMENT ON 12/06/19 FOR MORE REFILLS., Disp: 30 capsule, Rfl: 0 .  SYMBICORT 160-4.5 MCG/ACT inhaler, Inhale 2 puffs into the lungs 2 (two) times daily., Disp:  1 Inhaler, Rfl: 11 .  ipratropium-albuterol (DUONEB) 0.5-2.5 (3) MG/3ML SOLN, Take 3 mLs by nebulization., Disp: , Rfl:  .  mometasone-formoterol (DULERA) 100-5 MCG/ACT AERO, Inhale 2 puffs into the lungs 2 (two) times daily., Disp: 13 g, Rfl: [place] Mariabella Nilsen, DO [facility] 12/06/2019 1:30 PM

## 2019-12-08 ENCOUNTER — Telehealth: Payer: Self-pay | Admitting: Critical Care Medicine

## 2019-12-08 NOTE — Telephone Encounter (Signed)
Medication name and strength: Dulera 100 Provider: [place]: [facility] Patient insurance ID: [id number] Phone: [phone] Fax: [phone]  Was the PA started on CMM?  yes If yes, please enter the Key: [id number] Timeframe for approval/denial: Your information has been submitted to [facility]. [employer] will review the request and will issue a decision, typically within 3-7 days from your submission. You can check the updated outcome later by reopening this request.  If [employer] has not responded in 3-7 days or if you have any questions about your ePA request, please contact [employer] at [phone]. If you think there may be a problem with your PA request, use our live chat feature at the bottom right.   Called and spoke with pt letting her know that a PA was initiated for her [facility] inhaler and stated to her that it could take between 3-7 days before we will receive a response. Stated to her once we had received a response if it was approved or not, we would call her and let her know and pt verbalized understanding. Will keep encounter open and route to [facility] to follow up on.

## 2019-12-08 NOTE — Telephone Encounter (Signed)
Pt calling back stating that [employer] stated that she needs a pre authorization from doc for med. Can be reached at [phone]

## 2019-12-08 NOTE — Telephone Encounter (Signed)
Spoke with pt and she states that her pharmacy told her that Ruthe Mannan was not covered on her formulary. Spoke with pt's pharmacy and they gave me [phone] to call to check formulary. I spoke with [place] team and they told me pt has not been active with [facility] since 2019. I called pt back and she states that she should still be current with [employer]. Pt states she will call [employer] to get it figured out and will call back.

## 2019-12-13 NOTE — Telephone Encounter (Signed)
Checked Cover My Meds, PA has been approved as of 12/08/19.

## 2019-12-20 ENCOUNTER — Other Ambulatory Visit: Payer: Self-pay | Admitting: Primary Care

## 2020-01-06 ENCOUNTER — Other Ambulatory Visit: Payer: Medicare Other | Admitting: *Deleted

## 2020-01-06 ENCOUNTER — Other Ambulatory Visit: Payer: Self-pay

## 2020-01-06 DIAGNOSIS — E7800 Pure hypercholesterolemia, unspecified: Secondary | ICD-10-CM

## 2020-01-06 LAB — LIPID PANEL
Chol/HDL Ratio: 2.5 ratio (ref 0.0–4.4)
Cholesterol, Total: 137 mg/dL (ref 100–199)
HDL: 54 mg/dL (ref >39)
LDL Chol Calc (NIH): 73 mg/dL (ref 0–99)
Triglycerides: 44 mg/dL (ref 0–149)
VLDL Cholesterol Cal: 10 mg/dL (ref 5–40)

## 2020-01-06 LAB — ALT: ALT: 17 IU/L (ref 0–32)

## 2020-01-17 ENCOUNTER — Inpatient Hospital Stay: Payer: Medicare Other

## 2020-01-17 ENCOUNTER — Inpatient Hospital Stay: Payer: Medicare Other | Admitting: Family

## 2020-02-02 ENCOUNTER — Inpatient Hospital Stay (HOSPITAL_BASED_OUTPATIENT_CLINIC_OR_DEPARTMENT_OTHER): Payer: Medicare Other | Admitting: Family

## 2020-02-02 ENCOUNTER — Encounter: Payer: Self-pay | Admitting: Family

## 2020-02-02 ENCOUNTER — Inpatient Hospital Stay: Payer: Medicare Other | Attending: Family

## 2020-02-02 ENCOUNTER — Telehealth: Payer: Self-pay | Admitting: Hematology & Oncology

## 2020-02-02 ENCOUNTER — Other Ambulatory Visit: Payer: Self-pay

## 2020-02-02 VITALS — BP 156/56 | HR 69 | Temp 97.3°F | Resp 19 | Ht 63.0 in | Wt 116.0 lb

## 2020-02-02 DIAGNOSIS — D649 Anemia, unspecified: Secondary | ICD-10-CM | POA: Diagnosis not present

## 2020-02-02 DIAGNOSIS — D473 Essential (hemorrhagic) thrombocythemia: Secondary | ICD-10-CM

## 2020-02-02 DIAGNOSIS — Z7982 Long term (current) use of aspirin: Secondary | ICD-10-CM | POA: Diagnosis not present

## 2020-02-02 DIAGNOSIS — Z9981 Dependence on supplemental oxygen: Secondary | ICD-10-CM | POA: Insufficient documentation

## 2020-02-02 DIAGNOSIS — D75839 Thrombocytosis, unspecified: Secondary | ICD-10-CM

## 2020-02-02 DIAGNOSIS — J449 Chronic obstructive pulmonary disease, unspecified: Secondary | ICD-10-CM | POA: Diagnosis not present

## 2020-02-02 LAB — CMP (CANCER CENTER ONLY)
ALT: 16 U/L (ref 0–44)
AST: 23 U/L (ref 15–41)
Albumin: 4.4 g/dL (ref 3.5–5.0)
Alkaline Phosphatase: 52 U/L (ref 38–126)
Anion gap: 5 (ref 5–15)
BUN: 11 mg/dL (ref 8–23)
CO2: 32 mmol/L (ref 22–32)
Calcium: 9.5 mg/dL (ref 8.9–10.3)
Chloride: 98 mmol/L (ref 98–111)
Creatinine: 0.7 mg/dL (ref 0.44–1.00)
GFR, Est AFR Am: >60 mL/min (ref >60)
GFR, Estimated: >60 mL/min (ref >60)
Glucose, Bld: 111 mg/dL — ABNORMAL HIGH (ref 70–99)
Potassium: 4.6 mmol/L (ref 3.5–5.1)
Sodium: 135 mmol/L (ref 135–145)
Total Bilirubin: 0.5 mg/dL (ref 0.3–1.2)
Total Protein: 7.2 g/dL (ref 6.5–8.1)

## 2020-02-02 LAB — CBC WITH DIFFERENTIAL (CANCER CENTER ONLY)
Abs Immature Granulocytes: 0.03 10*3/uL (ref 0.00–0.07)
Basophils Absolute: 0 10*3/uL (ref 0.0–0.1)
Basophils Relative: 1 %
Eosinophils Absolute: 0 10*3/uL (ref 0.0–0.5)
Eosinophils Relative: 1 %
HCT: 36.2 % (ref 36.0–46.0)
Hemoglobin: 11.9 g/dL — ABNORMAL LOW (ref 12.0–15.0)
Immature Granulocytes: 1 %
Lymphocytes Relative: 32 %
Lymphs Abs: 1.7 10*3/uL (ref 0.7–4.0)
MCH: 31.6 pg (ref 26.0–34.0)
MCHC: 32.9 g/dL (ref 30.0–36.0)
MCV: 96 fL (ref 80.0–100.0)
Monocytes Absolute: 0.3 10*3/uL (ref 0.1–1.0)
Monocytes Relative: 6 %
Neutro Abs: 3.1 10*3/uL (ref 1.7–7.7)
Neutrophils Relative %: 59 %
Platelet Count: 306 10*3/uL (ref 150–400)
RBC: 3.77 MIL/uL — ABNORMAL LOW (ref 3.87–5.11)
RDW: 14.8 % (ref 11.5–15.5)
WBC Count: 5.2 10*3/uL (ref 4.0–10.5)
nRBC: 0 % (ref 0.0–0.2)

## 2020-02-02 LAB — LACTATE DEHYDROGENASE: LDH: 181 U/L (ref 98–192)

## 2020-02-02 NOTE — Telephone Encounter (Signed)
Appointments scheduled calendar printed & mailed per 6/3 los 

## 2020-02-02 NOTE — Progress Notes (Signed)
Hematology and Oncology Follow Up Visit  Zamyah Lello [id number] [birth year] 64 y.o. 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 Guyse is here today for follow-up. She is doing well on Hydrea and aspirin and verbalized that she is taking as prescribed.  Hgb is stable at 11.9, WBC count 5.2 and platelets 306.  She denies fatigue.  She is doing well on 2-3 L [place] supplemental O2 24 hours a day for COPD.   No episodes of bleeding. The skin on her arms is thin and she does bruise quite easily on aspirin.  No fever, chills, n/v, cough, rash, dizziness, chest pain, palpitations, abdominal pain or changes in bowel or bladder habits.  No swelling, tenderness, numbness or tingling in her extremities.  No falls or syncopal episodes to report.  She has maintained a good appetite and is staying well hydrated. Her weight is stable.   ECOG Performance Status: 1 - Symptomatic but completely ambulatory  Medications:  Allergies as of 02/02/2020   No Known Allergies     Medication List       Accurate as of February 02, 2020 10:52 AM. If you have any questions, ask your nurse or doctor.        STOP taking these medications   ipratropium-albuterol 0.5-2.5 (3) MG/3ML Soln Commonly known as: DUONEB Stopped by: Laverna Peace, NP   prochlorperazine 10 MG tablet Commonly known as: COMPAZINE Stopped by: Laverna Peace, NP     TAKE these medications   aspirin EC 81 MG tablet Take 81 mg daily by mouth.   diltiazem 60 MG tablet Commonly known as: CARDIZEM Take 1 tablet (60 mg total) by mouth 2 (two) times daily.   Euthyrox 88 MCG tablet Generic drug: levothyroxine Take by mouth. What changed: Another medication with the same name was removed. Continue taking this medication, and follow the directions you see here. Changed  by: Laverna Peace, NP   losartan 50 MG tablet Commonly known as: COZAAR Take 50 mg by mouth daily.   metoprolol succinate 25 MG 24 hr tablet Commonly known as: TOPROL-XL Take 25 mg by mouth daily.   mometasone-formoterol 100-5 MCG/ACT Aero Commonly known as: DULERA Inhale 2 puffs into the lungs 2 (two) times daily.   multivitamin tablet Take 1 tablet by mouth daily.   rosuvastatin 20 MG tablet Commonly known as: CRESTOR Take 1 tablet (20 mg total) by mouth daily. Dose increased.   Spiriva HandiHaler 18 MCG inhalation capsule Generic drug: tiotropium Place 1 capsule (18 mcg total) into inhaler and inhale daily.       Allergies: No Known Allergies  Past Medical History, Surgical history, Social history, and Family History were reviewed and updated.  Review of Systems: All other 10 point review of systems is negative.   Physical Exam:  height is 5\' 3"  (1.6 m) and weight is 116 lb 0.6 oz (52.6 kg). Her temporal temperature is 97.3 F (36.3 C) (abnormal). Her blood pressure is 156/56 (abnormal) and her pulse is 69. Her respiration is 19 and oxygen saturation is 99%.   Wt Readings from Last 3 Encounters:  02/02/20 116 lb 0.6 oz (52.6 kg)  12/06/19 114 lb 9.6 oz (52 kg)  11/15/19 114 lb 12.8 oz (52.1 kg)    Ocular: Sclerae unicteric, pupils equal, round and reactive to light Ear-nose-throat: Oropharynx clear, dentition fair  Lymphatic: No cervical or supraclavicular adenopathy Lungs no rales or rhonchi, good excursion bilaterally Heart regular rate and rhythm, no murmur appreciated Abd soft, nontender, positive bowel sounds, no liver or spleen tip palpated on exam, no fluid wave  MSK no focal spinal tenderness, no joint edema Neuro: non-focal, well-oriented, appropriate affect Breasts: Deferred   Lab Results  Component Value Date   WBC 5.2 02/02/2020   HGB 11.9 (L) 02/02/2020   HCT 36.2 02/02/2020   MCV 96.0 02/02/2020   PLT 306 02/02/2020   Lab Results    Component Value Date   FERRITIN 175 09/15/2019   IRON 135 09/15/2019   TIBC 266 09/15/2019   UIBC 131 09/15/2019   IRONPCTSAT 51 09/15/2019   Lab Results  Component Value Date   RBC 3.77 (L) 02/02/2020   No results found for: KPAFRELGTCHN, LAMBDASER, KAPLAMBRATIO No results found for: Kandis Cocking, IGMSERUM Lab Results  Component Value Date   TOTALPROTELP 6.8 09/23/2018   ALBUMINELP 3.9 09/23/2018   A1GS 0.1 09/23/2018   A2GS 0.9 09/23/2018   BETS 0.8 09/23/2018   GAMS 1.0 09/23/2018   MSPIKE Not Observed 09/23/2018   SPEI Comment 09/23/2018     Chemistry      Component Value Date/Time   NA 135 02/02/2020 1009   K 4.6 02/02/2020 1009   CL 98 02/02/2020 1009   CO2 32 02/02/2020 1009   BUN 11 02/02/2020 1009   CREATININE 0.70 02/02/2020 1009      Component Value Date/Time   CALCIUM 9.5 02/02/2020 1009   ALKPHOS 52 02/02/2020 1009   AST 23 02/02/2020 1009   ALT 16 02/02/2020 1009   BILITOT 0.5 02/02/2020 1009       Impression and Plan: Ms. Kroon is a very pleasant 64 yo female with essential thrombocythemia, JAK2 V617F+; high risk by IPSET.  She is doing well on Hydrea and aspirin and her counts have remained stable.  She will continue her same regimen and we will see her again ian other 3 months.  She will contact our office with any questions or concerns. We can certainly see her sooner if needed.   Laverna Peace, NP 6/3/202110:52 AM

## 2020-04-20 ENCOUNTER — Other Ambulatory Visit: Payer: Self-pay | Admitting: *Deleted

## 2020-04-20 DIAGNOSIS — D473 Essential (hemorrhagic) thrombocythemia: Secondary | ICD-10-CM

## 2020-04-20 MED ORDER — HYDROXYUREA 500 MG PO CAPS: 500.0000 mg | ORAL_CAPSULE | ORAL | 3 refills | Status: AC

## 2020-05-04 ENCOUNTER — Encounter: Payer: Self-pay | Admitting: Hematology & Oncology

## 2020-05-04 ENCOUNTER — Inpatient Hospital Stay: Payer: Medicare Other | Attending: Family

## 2020-05-04 ENCOUNTER — Inpatient Hospital Stay (HOSPITAL_BASED_OUTPATIENT_CLINIC_OR_DEPARTMENT_OTHER): Payer: Medicare Other | Admitting: Hematology & Oncology

## 2020-05-04 ENCOUNTER — Other Ambulatory Visit: Payer: Self-pay

## 2020-05-04 VITALS — BP 160/64 | HR 77 | Temp 97.6°F | Resp 32 | Wt 116.8 lb

## 2020-05-04 DIAGNOSIS — Z7982 Long term (current) use of aspirin: Secondary | ICD-10-CM | POA: Insufficient documentation

## 2020-05-04 DIAGNOSIS — D473 Essential (hemorrhagic) thrombocythemia: Secondary | ICD-10-CM

## 2020-05-04 DIAGNOSIS — J449 Chronic obstructive pulmonary disease, unspecified: Secondary | ICD-10-CM | POA: Diagnosis not present

## 2020-05-04 DIAGNOSIS — D75839 Thrombocytosis, unspecified: Secondary | ICD-10-CM

## 2020-05-04 DIAGNOSIS — D649 Anemia, unspecified: Secondary | ICD-10-CM

## 2020-05-04 LAB — CMP (CANCER CENTER ONLY)
ALT: 16 U/L (ref 0–44)
AST: 23 U/L (ref 15–41)
Albumin: 4.3 g/dL (ref 3.5–5.0)
Alkaline Phosphatase: 54 U/L (ref 38–126)
Anion gap: 6 (ref 5–15)
BUN: 14 mg/dL (ref 8–23)
CO2: 31 mmol/L (ref 22–32)
Calcium: 9.6 mg/dL (ref 8.9–10.3)
Chloride: 98 mmol/L (ref 98–111)
Creatinine: 0.65 mg/dL (ref 0.44–1.00)
GFR, Est AFR Am: >60 mL/min (ref >60)
GFR, Estimated: >60 mL/min (ref >60)
Glucose, Bld: 95 mg/dL (ref 70–99)
Potassium: 4.5 mmol/L (ref 3.5–5.1)
Sodium: 135 mmol/L (ref 135–145)
Total Bilirubin: 0.4 mg/dL (ref 0.3–1.2)
Total Protein: 7.4 g/dL (ref 6.5–8.1)

## 2020-05-04 LAB — CBC WITH DIFFERENTIAL (CANCER CENTER ONLY)
Abs Immature Granulocytes: 0.01 10*3/uL (ref 0.00–0.07)
Basophils Absolute: 0 10*3/uL (ref 0.0–0.1)
Basophils Relative: 1 %
Eosinophils Absolute: 0 10*3/uL (ref 0.0–0.5)
Eosinophils Relative: 1 %
HCT: 33.5 % — ABNORMAL LOW (ref 36.0–46.0)
Hemoglobin: 11.1 g/dL — ABNORMAL LOW (ref 12.0–15.0)
Immature Granulocytes: 0 %
Lymphocytes Relative: 35 %
Lymphs Abs: 1.7 10*3/uL (ref 0.7–4.0)
MCH: 32.6 pg (ref 26.0–34.0)
MCHC: 33.1 g/dL (ref 30.0–36.0)
MCV: 98.5 fL (ref 80.0–100.0)
Monocytes Absolute: 0.5 10*3/uL (ref 0.1–1.0)
Monocytes Relative: 10 %
Neutro Abs: 2.5 10*3/uL (ref 1.7–7.7)
Neutrophils Relative %: 53 %
Platelet Count: 269 10*3/uL (ref 150–400)
RBC: 3.4 MIL/uL — ABNORMAL LOW (ref 3.87–5.11)
RDW: 14.2 % (ref 11.5–15.5)
WBC Count: 4.8 10*3/uL (ref 4.0–10.5)
nRBC: 0 % (ref 0.0–0.2)

## 2020-05-04 NOTE — Progress Notes (Signed)
Hematology and Oncology Follow Up Visit  Cheryl Haley [id number] 1956-05-11 64 y.o. 05/04/2020   Principle Diagnosis:  Essential thrombocythemia, JAK2 V617F+; high risk by IPSET   Current Therapy: ASA 81mg  daily Hydrea, currently 500mg  every 2 days, 10/2019 - present   Interim History:  Cheryl Haley is here today for follow-up.  She was followed by Dr. Maylon Peppers.  She has essential thrombocythemia.  She is JAK2 positive.  She is done incredibly well just with Hydrea.  She is on 500 mg of Hydrea every other day.  Her platelet count has come down quite nicely.  She is on chronic oxygen.  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  She has had no problems with tingling or pain in the hands or feet.  She has had no problems with rashes.  There is been no issues with nausea or vomiting.  She is planning for a quiet Labor Day weekend.  There has been no issues with diarrhea.  She has had no headache.  Overall, her performance status is ECOG one.  Medications:  Allergies as of 05/04/2020   No Known Allergies     Medication List       Accurate as of May 04, 2020 12:15 PM. If you have any questions, ask your nurse or doctor.        aspirin EC 81 MG tablet Take 81 mg daily by mouth.   diltiazem 60 MG tablet Commonly known as: CARDIZEM Take 1 tablet (60 mg total) by mouth 2 (two) times daily.   Euthyrox 88 MCG tablet Generic drug: levothyroxine Take by mouth.   hydroxyurea 500 MG capsule Commonly known as: HYDREA Take 1 capsule (500 mg total) by mouth every other day. May take with food to minimize GI side effects.   losartan 50 MG tablet Commonly known as: COZAAR Take 50 mg by mouth daily.   metoprolol succinate 25 MG 24 hr tablet Commonly known as: TOPROL-XL Take 25 mg by mouth daily.   mometasone-formoterol 100-5 MCG/ACT Aero Commonly known as: DULERA Inhale 2 puffs into the lungs 2 (two) times daily.   multivitamin tablet Take 1 tablet by mouth daily.     rosuvastatin 20 MG tablet Commonly known as: CRESTOR Take 1 tablet (20 mg total) by mouth daily. Dose increased.   Spiriva HandiHaler 18 MCG inhalation capsule Generic drug: tiotropium Place 1 capsule (18 mcg total) into inhaler and inhale daily.       Allergies: No Known Allergies  Past Medical History, Surgical history, Social history, and Family History were reviewed and updated.  Review of Systems: Review of Systems  Constitutional: Negative.   HENT: Negative.   Eyes: Negative.   Respiratory: Positive for shortness of breath.   Cardiovascular: Negative.   Gastrointestinal: Negative.   Genitourinary: Negative.   Musculoskeletal: Negative.   Skin: Negative.   Neurological: Negative.   Endo/Heme/Allergies: Negative.   Psychiatric/Behavioral: Negative.      Physical Exam:  weight is 116 lb 12.8 oz (53 kg). Her oral temperature is 97.6 F (36.4 C). Her blood pressure is 160/64 (abnormal) and her pulse is 77. Her respiration is 32 (abnormal) and oxygen saturation is 98%.   Wt Readings from Last 3 Encounters:  05/04/20 116 lb 12.8 oz (53 kg)  02/02/20 116 lb 0.6 oz (52.6 kg)  12/06/19 114 lb 9.6 oz (52 kg)    Physical Exam Vitals reviewed.  HENT:     Head: Normocephalic and atraumatic.  Eyes:     Pupils: Pupils are equal,  round, and reactive to light.  Cardiovascular:     Rate and Rhythm: Normal rate and regular rhythm.     Heart sounds: Normal heart sounds.  Pulmonary:     Effort: Pulmonary effort is normal.     Breath sounds: Normal breath sounds.  Abdominal:     General: Bowel sounds are normal.     Palpations: Abdomen is soft.  Musculoskeletal:        General: No tenderness or deformity. Normal range of motion.     Cervical back: Normal range of motion.  Lymphadenopathy:     Cervical: No cervical adenopathy.  Skin:    General: Skin is warm and dry.     Findings: No erythema or rash.  Neurological:     Mental Status: She is alert and oriented to  person, place, and time.  Psychiatric:        Behavior: Behavior normal.        Thought Content: Thought content normal.        Judgment: Judgment normal.       Lab Results  Component Value Date   WBC 4.8 05/04/2020   HGB 11.1 (L) 05/04/2020   HCT 33.5 (L) 05/04/2020   MCV 98.5 05/04/2020   PLT 269 05/04/2020   Lab Results  Component Value Date   FERRITIN 175 09/15/2019   IRON 135 09/15/2019   TIBC 266 09/15/2019   UIBC 131 09/15/2019   IRONPCTSAT 51 09/15/2019   Lab Results  Component Value Date   RBC 3.40 (L) 05/04/2020   No results found for: KPAFRELGTCHN, LAMBDASER, KAPLAMBRATIO No results found for: Kandis Cocking, IGMSERUM Lab Results  Component Value Date   TOTALPROTELP 6.8 09/23/2018   ALBUMINELP 3.9 09/23/2018   A1GS 0.1 09/23/2018   A2GS 0.9 09/23/2018   BETS 0.8 09/23/2018   GAMS 1.0 09/23/2018   MSPIKE Not Observed 09/23/2018   SPEI Comment 09/23/2018     Chemistry      Component Value Date/Time   NA 135 05/04/2020 1132   K 4.5 05/04/2020 1132   CL 98 05/04/2020 1132   CO2 31 05/04/2020 1132   BUN 14 05/04/2020 1132   CREATININE 0.65 05/04/2020 1132      Component Value Date/Time   CALCIUM 9.6 05/04/2020 1132   ALKPHOS 54 05/04/2020 1132   AST 23 05/04/2020 1132   ALT 16 05/04/2020 1132   BILITOT 0.4 05/04/2020 1132       Impression and Plan: Cheryl Haley is a very pleasant 64 yo female with essential thrombocythemia, JAK2 V617F+; high risk by IPSET.   She is doing well on Hydrea and aspirin and her counts have continued to improve.  I looked at her blood smear under the microscope.  I do not see anything that looked suspicious.  We will plan to get her back in 3 months.  If all is good in 3 months, then we will plan to move her appointments out every 4 months.    Volanda Napoleon, MD 9/3/202112:15 PM

## 2020-05-08 LAB — LACTATE DEHYDROGENASE: LDH: 154 U/L (ref 98–192)

## 2020-06-05 ENCOUNTER — Other Ambulatory Visit: Payer: Self-pay | Admitting: Critical Care Medicine

## 2020-06-06 ENCOUNTER — Ambulatory Visit (INDEPENDENT_AMBULATORY_CARE_PROVIDER_SITE_OTHER): Payer: Medicare Other | Admitting: Adult Health

## 2020-06-06 ENCOUNTER — Other Ambulatory Visit: Payer: Self-pay

## 2020-06-06 ENCOUNTER — Encounter: Payer: Self-pay | Admitting: Adult Health

## 2020-06-06 VITALS — BP 136/78 | HR 76 | Temp 96.9°F | Ht 64.0 in | Wt 117.0 lb

## 2020-06-06 DIAGNOSIS — R918 Other nonspecific abnormal finding of lung field: Secondary | ICD-10-CM | POA: Diagnosis not present

## 2020-06-06 DIAGNOSIS — E43 Unspecified severe protein-calorie malnutrition: Secondary | ICD-10-CM | POA: Diagnosis not present

## 2020-06-06 DIAGNOSIS — Z23 Encounter for immunization: Secondary | ICD-10-CM

## 2020-06-06 DIAGNOSIS — J9611 Chronic respiratory failure with hypoxia: Secondary | ICD-10-CM | POA: Diagnosis not present

## 2020-06-06 DIAGNOSIS — J439 Emphysema, unspecified: Secondary | ICD-10-CM | POA: Diagnosis not present

## 2020-06-06 MED ORDER — SPIRIVA HANDIHALER 18 MCG IN CAPS: 18.0000 ug | ORAL_CAPSULE | Freq: Every day | RESPIRATORY_TRACT | 11 refills | Status: DC

## 2020-06-06 NOTE — Patient Instructions (Addendum)
Continue on Dulera 2 puffs Twice daily , rinse after use.  Continue on Spiriva 1 puff daily  Continue on Oxygen 2l/m . (3l/m Pulse device)  Activity as tolerated.  CT Chest later this month as planned.  Flu shot today  Follow up with [name] or Maycee Blasco NP in 6 months Please contact office for sooner follow up if symptoms do not improve or worsen or seek emergency care

## 2020-06-06 NOTE — Assessment & Plan Note (Signed)
Cont w/ high protein diet

## 2020-06-06 NOTE — Addendum Note (Signed)
Addended by: Claudette Head A on: 06/06/2020 11:53 AM   Modules accepted: Orders

## 2020-06-06 NOTE — Assessment & Plan Note (Signed)
Compensated on present regimen   Plan  Patient Instructions  Continue on Dulera 2 puffs Twice daily , rinse after use.  Continue on Spiriva 1 puff daily  Continue on Oxygen 2l/m . (3l/m Pulse device)  Activity as tolerated.  CT Chest later this month as planned.  Flu shot today  Follow up with [name] or Aanika Defoor NP in 6 months Please contact office for sooner follow up if symptoms do not improve or worsen or seek emergency care

## 2020-06-06 NOTE — Assessment & Plan Note (Signed)
Continue to follow serially on CT chest  CT planned later this month

## 2020-06-06 NOTE — Assessment & Plan Note (Signed)
Continue on Oxygen   

## 2020-06-06 NOTE — Progress Notes (Signed)
@Patient  ID: Cheryl Haley, female    DOB: 04-13-1956, 64 y.o.   MRN: [id number]  Chief Complaint  Patient presents with  . Follow-up    Dyspnea     Referring provider: [name], MD  HPI: 64 year old female followed for COPD and chronic respiratory failure on oxygen. Pulmonary nodules noted on CT chest scans.   TEST/EVENTS :  Chest Imaging- films reviewed: CT chest 06/28/2019- severe centrilobular emphysema throughout both lungs.  Left upper lobe spiculated nodule associated with linear scar.  Right middle lobe spiculated nodule.  Airway thickening.  Scarring in right greater than left lower lobes.  No significant mediastinal or hilar adenopathy.  Nodules stable or smaller than previous CT scans.  CT cardiac scoring 10/07/2019- redemonstrated right middle lobe nodule.  No new concerning nodules.  Pulmonary Functions Testing Results: No flowsheet data found.   Spirometry 2015: FVC  2.0 (63%) FEV1  0.8 (33%) Ratio 41  10/25/2019 SPECT scan: EF 63%, low risk study.  No EKG changes during stress.  06/06/2020 Follow up: COPD and O2 RF  Patient returns for 92-month follow-up.  Patient has underlying severe COPD and chronic hypoxic respiratory failure on home oxygen.  Patient says overall breathing has been doing about the same.  She denies any increased flare of cough or wheezing.  Shortness of breath with mild activities.  Activity level is at baseline. Remains on Spiriva and Dulera.  No increased albuterol use. Would like to have flu shot today Declines Covid vaccine patient education given. Previous CT scans have shown multiple bilateral pulmonary nodules, maximum 1.2 cm in the right upper lobe.  She has a follow-up serial CT end of this month.  Denies any hemoptysis or unintentional weight loss. Able to do household chores. Drives.  Remains on Oxygen 2l/m 24/7 . Has a POC uses 3l/m pulsing .    No Known Allergies  Immunization History  Administered Date(s)  Administered  . Influenza Split 05/31/2015  . Influenza,inj,Quad PF,6+ Mos 09/22/2013, 06/10/2016, 06/25/2017, 04/30/2018, 06/01/2019  . Pneumococcal Conjugate-13 04/17/2015  . Pneumococcal Polysaccharide-23 12/08/2016    Past Medical History:  Diagnosis Date  . Anxiety   . 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 COPD (chronic obstructive pulmonary disease) ([place])   . Emphysema lung ([place])   . Essential thrombocythemia ([place]) 09/15/2019  . History of hiatal hernia   . Hx of TIA (transient ischemic attack) and stroke   . Hyperlipidemia   . Hypertension   . Hypothyroidism   . Impaired fasting glucose   . Pleural effusion 05/15/2014   CXR 05/2014:  New small right effusion.    . Pneumonia   . Protein-calorie malnutrition, severe 09/17/2018  . Pulmonary nodules 07/14/2018   07/2017 CT chest >pulmonary nodules noted.  07/2017 PET scan -Hypermetabolic LUL nodule  04/6760 CT chest >decreased nodule size, new pulmonary nodules noted.  FOB -atypical cells  CT chest 07/2018 >stable nodules to smaller on established nodules . 2 new nodules RUL , RLL >CT chest 3 months   . Thyroid disease   . Tobacco abuse 11/28/2015   Smoked 2 packs of cigarettes daily from teenage years until age 45, quit in 2016   . Unspecified hypothyroidism 09/20/2013  . Viral respiratory infection 09/16/2018    Tobacco History: Social History   Tobacco Use  Smoking Status Former Smoker  . Packs/day: 2.00  . Years: 40.00  . Pack years: 80.00  . Types: Cigarettes  . Quit date: 03/16/2017  . Years since quitting: 3.2  Smokeless Tobacco Never Used  Tobacco Comment       Counseling given: Not Answered Comment:     Outpatient Medications Prior to Visit  Medication Sig Dispense Refill  . aspirin EC 81 MG tablet Take 81 mg daily by mouth.     . diltiazem (CARDIZEM) 60 MG tablet Take 1 tablet (60 mg total) by mouth 2 (two) times daily. 60 tablet 0  . EUTHYROX 88 MCG tablet Take by mouth.    . losartan (COZAAR) 50 MG tablet Take 50  mg by mouth daily.    . metoprolol succinate (TOPROL-XL) 25 MG 24 hr tablet Take 25 mg by mouth daily.    . mometasone-formoterol (DULERA) 100-5 MCG/ACT AERO Inhale 2 puffs into the lungs 2 (two) times daily. 13 g 11  . Multiple Vitamin (MULTIVITAMIN) tablet Take 1 tablet by mouth daily.    . rosuvastatin (CRESTOR) 20 MG tablet Take 1 tablet (20 mg total) by mouth daily. Dose increased. 90 tablet 3  . SPIRIVA HANDIHALER 18 MCG inhalation capsule PLACE 1 CAPSULE (18 MCG TOTAL) INTO INHALER AND INHALE DAILY 30 capsule 5   No facility-administered medications prior to visit.     Review of Systems:   Constitutional:   No  weight loss, night sweats,  Fevers, chills, + fatigue, or  lassitude.  HEENT:   No headaches,  Difficulty swallowing,  Tooth/dental problems, or  Sore throat,                No sneezing, itching, ear ache, nasal congestion, post nasal drip,   CV:  No chest pain,  Orthopnea, PND, swelling in lower extremities, anasarca, dizziness, palpitations, syncope.   GI  No heartburn, indigestion, abdominal pain, nausea, vomiting, diarrhea, change in bowel habits, loss of appetite, bloody stools.   Resp:    No chest wall deformity  Skin: no rash or lesions.  GU: no dysuria, change in color of urine, no urgency or frequency.  No flank pain, no hematuria   MS:  No joint pain or swelling.  No decreased range of motion.  No back pain.    Physical Exam  BP 136/78 (BP Location: Left Arm, Cuff Size: Normal)   Pulse 76   Temp (!) 96.9 F (36.1 C) (Temporal)   Ht 5\' 4"  (1.626 m)   Wt 117 lb (53.1 kg)   SpO2 96%   BMI 20.08 kg/m   GEN: A/Ox3; pleasant , NAD, well nourished    HEENT:  [place]/AT,  NOSE-clear, THROAT-clear, no lesions, no postnasal drip or exudate noted.   NECK:  Supple w/ fair ROM; no JVD; normal carotid impulses w/o bruits; no thyromegaly or nodules palpated; no lymphadenopathy.    RESP  Clear  P & A; w/o, wheezes/ rales/ or rhonchi. no accessory muscle use, no  dullness to percussion  CARD:  RRR, no m/r/g, no peripheral edema, pulses intact, no cyanosis or clubbing.  GI:   Soft & nt; nml bowel sounds; no organomegaly or masses detected.   Musco: Warm bil, no deformities or joint swelling noted.   Neuro: alert, no focal deficits noted.    Skin: Warm, no lesions or rashes    Lab Results:  CBC   BNP No results found for: BNP  ProBNP  Imaging: No results found.    No flowsheet data found.  No results found for: NITRICOXIDE      Assessment & Plan:   COPD (chronic obstructive pulmonary disease) with emphysema ([place]) Compensated on present regimen  Plan  Patient Instructions  Continue on Dulera 2 puffs Twice daily , rinse after use.  Continue on Spiriva 1 puff daily  Continue on Oxygen 2l/m . (3l/m Pulse device)  Activity as tolerated.  CT Chest later this month as planned.  Flu shot today  Follow up with [name] or Lillyonna Armstead NP in 6 months Please contact office for sooner follow up if symptoms do not improve or worsen or seek emergency care       Chronic respiratory failure with hypoxia ([place]) Continue on Oxygen.   Pulmonary nodules Continue to follow serially on CT chest  CT planned later this month    Protein-calorie malnutrition, severe Cont w/ high protein diet      Rexene Edison, NP 06/06/2020

## 2020-06-28 ENCOUNTER — Ambulatory Visit
Admission: RE | Admit: 2020-06-28 | Discharge: 2020-06-28 | Disposition: A | Discharge status: Home / Self Care | Payer: Medicare Other | Source: Ambulatory Visit | Attending: Critical Care Medicine | Admitting: Critical Care Medicine

## 2020-06-28 ENCOUNTER — Other Ambulatory Visit: Payer: Self-pay

## 2020-06-28 DIAGNOSIS — R918 Other nonspecific abnormal finding of lung field: Secondary | ICD-10-CM

## 2020-06-28 DIAGNOSIS — J439 Emphysema, unspecified: Secondary | ICD-10-CM

## 2020-07-02 NOTE — Progress Notes (Signed)
Please let Cheryl Haley know that her scan is stable and she needs a follow up CT scan next year given her smoking history. Thanks!  LPC

## 2020-07-02 NOTE — Progress Notes (Signed)
Please let patient know CT chest showed stable pulmonary nodules, severe emphysema. She also had moderate- severe atherosclerotic changes and calcifications of aorta and coronary artery. She can follow up with PCP or cardiology- non urgent  Please refer to lung cancer screening program

## 2020-07-06 ENCOUNTER — Other Ambulatory Visit: Payer: Self-pay | Admitting: *Deleted

## 2020-07-06 DIAGNOSIS — Z87891 Personal history of nicotine dependence: Secondary | ICD-10-CM

## 2020-07-06 DIAGNOSIS — J9611 Chronic respiratory failure with hypoxia: Secondary | ICD-10-CM

## 2020-07-06 DIAGNOSIS — J439 Emphysema, unspecified: Secondary | ICD-10-CM

## 2020-07-12 ENCOUNTER — Encounter: Payer: Self-pay | Admitting: Cardiology

## 2020-07-12 ENCOUNTER — Other Ambulatory Visit: Payer: Self-pay

## 2020-07-12 ENCOUNTER — Ambulatory Visit (INDEPENDENT_AMBULATORY_CARE_PROVIDER_SITE_OTHER): Payer: Medicare Other | Admitting: Cardiology

## 2020-07-12 VITALS — BP 146/68 | HR 78 | Ht 64.0 in | Wt 117.6 lb

## 2020-07-12 DIAGNOSIS — R002 Palpitations: Secondary | ICD-10-CM

## 2020-07-12 DIAGNOSIS — R931 Abnormal findings on diagnostic imaging of heart and coronary circulation: Secondary | ICD-10-CM

## 2020-07-12 DIAGNOSIS — E7800 Pure hypercholesterolemia, unspecified: Secondary | ICD-10-CM | POA: Diagnosis not present

## 2020-07-12 DIAGNOSIS — I700 Atherosclerosis of aorta: Secondary | ICD-10-CM

## 2020-07-12 DIAGNOSIS — I10 Essential (primary) hypertension: Secondary | ICD-10-CM | POA: Diagnosis not present

## 2020-07-12 LAB — LIPID PANEL
Chol/HDL Ratio: 2.6 ratio (ref 0.0–4.4)
Cholesterol, Total: 144 mg/dL (ref 100–199)
HDL: 56 mg/dL (ref >39)
LDL Chol Calc (NIH): 74 mg/dL (ref 0–99)
Triglycerides: 67 mg/dL (ref 0–149)
VLDL Cholesterol Cal: 14 mg/dL (ref 5–40)

## 2020-07-12 LAB — ALT: ALT: 25 IU/L (ref 0–32)

## 2020-07-12 NOTE — Addendum Note (Signed)
Addended by: Antonieta Iba on: 07/12/2020 09:09 AM   Modules accepted: Orders

## 2020-07-12 NOTE — Progress Notes (Signed)
Cardiology Office Consult Note    Date:  07/12/2020   ID:  Cheryl Haley, DOB 05/04/1956, MRN [id number]  PCP:  Cheryl Fess, MD  Cardiologist:  Cheryl Him, MD   Chief Complaint  Patient presents with  . Follow-up    Coronary artery calcifications    History of Present Illness:  Cheryl Haley is a 64 y.o. female with a hx of HTN, COPD, HLD and tobacco abuse who is referred last winter for evaluation of palpitations.  Event monitor was normal at that time.  She has chronic DOE related to COPD and is on O2.  She was noted to have coronary calcifications in all 3 coronaries by screening Chest CT for hx of tobacco last fall.  She underwent coronary Ca score in Feb 2021 which was high at 678 and placed her in the 97th% for age and sex matched controls and was also found to have aortic atherosclerosis.    She is now here today to discuss the results of her calcium score.  She is here today for followup and is doing well.  She has chronic SOB on home O2 which is very stable.  She denies any chest pain or pressure, PND, orthopnea, LE edema, dizziness, palpitations or syncope. She is compliant with her meds and is tolerating meds with no SE.     Past Medical History:  Diagnosis Date  . Anxiety   . 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 COPD (chronic obstructive pulmonary disease) ([place])   . Emphysema lung ([place])   . Essential thrombocythemia ([place]) 09/15/2019  . History of hiatal hernia   . Hx of TIA (transient ischemic attack) and stroke   . Hyperlipidemia   . Hypertension   . Hypothyroidism   . Impaired fasting glucose   . Pleural effusion 05/15/2014   CXR 05/2014:  New small right effusion.    . Pneumonia   . Protein-calorie malnutrition, severe 09/17/2018  . Pulmonary nodules 07/14/2018   07/2017 CT chest >pulmonary nodules noted.  07/2017 PET scan -Hypermetabolic LUL nodule  10/6710 CT chest >decreased nodule size, new pulmonary nodules noted.  FOB -atypical cells  CT chest 07/2018 >stable  nodules to smaller on established nodules . 2 new nodules RUL , RLL >CT chest 3 months   . Thyroid disease   . Tobacco abuse 11/28/2015   Smoked 2 packs of cigarettes daily from teenage years until age 6, quit in 2016   . Unspecified hypothyroidism 09/20/2013  . Viral respiratory infection 09/16/2018    Past Surgical History:  Procedure Laterality Date  . ESOPHAGOGASTRODUODENOSCOPY    . VIDEO BRONCHOSCOPY WITH ENDOBRONCHIAL NAVIGATION Left 07/29/2017   Procedure: VIDEO BRONCHOSCOPY WITH ENDOBRONCHIAL NAVIGATION;  Surgeon: Collene Gobble, MD;  Location: [facility] OR;  Service: Thoracic;  Laterality: Left;    Current Medications: Current Meds  Medication Sig  . aspirin EC 81 MG tablet Take 81 mg daily by mouth.   . diltiazem (CARDIZEM) 60 MG tablet Take 1 tablet (60 mg total) by mouth 2 (two) times daily.  Arna Medici 88 MCG tablet Take by mouth.  . losartan (COZAAR) 50 MG tablet Take 50 mg by mouth daily.  . metoprolol succinate (TOPROL-XL) 25 MG 24 hr tablet Take 25 mg by mouth daily.  . mometasone-formoterol (DULERA) 100-5 MCG/ACT AERO Inhale 2 puffs into the lungs 2 (two) times daily.  . Multiple Vitamin (MULTIVITAMIN) tablet Take 1 tablet by mouth daily.  . OXYGEN Inhale 2 L into the lungs continuous.  Marland Kitchen  rosuvastatin (CRESTOR) 20 MG tablet Take 1 tablet (20 mg total) by mouth daily. Dose increased.  Marland Kitchen tiotropium (SPIRIVA HANDIHALER) 18 MCG inhalation capsule Place 1 capsule (18 mcg total) into inhaler and inhale daily.    Allergies:   Patient has no known allergies.   Social History   Socioeconomic History  . Marital status: Divorced    Spouse name: Not on file  . Number of children: Not on file  . Years of education: Not on file  . Highest education level: Not on file  Occupational History  . Not on file  Tobacco Use  . Smoking status: Former Smoker    Packs/day: 2.00    Years: 40.00    Pack years: 80.00    Types: Cigarettes    Quit date: 03/16/2017    Years since quitting:  3.3  . Smokeless tobacco: Never Used  . Tobacco comment:    Vaping Use  . Vaping Use: Never used  Substance and Sexual Activity  . Alcohol use: Yes    Alcohol/week: 14.0 standard drinks    Types: 14 Cans of beer per week  . Drug use: No  . Sexual activity: Not on file  Other Topics Concern  . Not on file  Social History Narrative  . Not on file   Social Determinants of Health   Financial Resource Strain:   . Difficulty of Paying Living Expenses: Not on file  Food Insecurity:   . Worried About Charity fundraiser in the Last Year: Not on file  . Ran Out of Food in the Last Year: Not on file  Transportation Needs:   . Lack of Transportation (Medical): Not on file  . Lack of Transportation (Non-Medical): Not on file  Physical Activity:   . Days of Exercise per Week: Not on file  . Minutes of Exercise per Session: Not on file  Stress:   . Feeling of Stress : Not on file  Social Connections:   . Frequency of Communication with Friends and Family: Not on file  . Frequency of Social Gatherings with Friends and Family: Not on file  . Attends Religious Services: Not on file  . Active Member of Clubs or Organizations: Not on file  . Attends Archivist Meetings: Not on file  . Marital Status: Not on file     Family History:  The patient's family history includes Cancer in her sister and sister; Congestive Heart Failure in her sister and sister; Emphysema in her brother, father, sister, and sister; Heart disease in her brother, brother, father, mother, and sister.   ROS:   Please see the history of present illness.    ROS All other systems reviewed and are negative.  No flowsheet data found.   PHYSICAL EXAM:   VS:  BP (!) 146/68 (BP Location: Left Arm, Patient Position: Sitting, Cuff Size: Normal)   Pulse 78   Ht 5\' 4"  (1.626 m)   Wt 117 lb 9.6 oz (53.3 kg)   SpO2 99% Comment: with 3 L of O2  BMI 20.19 kg/m     GEN: Well nourished, well developed in no acute  distress HEENT: Normal NECK: No JVD; No carotid bruits LYMPHATICS: No lymphadenopathy CARDIAC:RRR, no murmurs, rubs, gallops RESPIRATORY:  Decreased SB throughout ABDOMEN: Soft, non-tender, non-distended MUSCULOSKELETAL:  No edema; No deformity  SKIN: Warm and dry NEUROLOGIC:  Alert and oriented x 3 PSYCHIATRIC:  Normal affect    Wt Readings from Last 3 Encounters:  07/12/20 117 lb  9.6 oz (53.3 kg)  06/06/20 117 lb (53.1 kg)  12/06/19 114 lb 9.6 oz (52 kg)      Studies/Labs Reviewed:   EKG:  EKG is not ordered today.    Recent Labs: 05/04/2020: ALT 16; BUN 14; Creatinine 0.65; Hemoglobin 11.1; Platelet Count 269; Potassium 4.5; Sodium 135   Lipid Panel    Component Value Date/Time   CHOL 137 01/06/2020 0809   TRIG 44 01/06/2020 0809   HDL 54 01/06/2020 0809   CHOLHDL 2.5 01/06/2020 0809   LDLCALC 73 01/06/2020 0809    Additional studies/ records that were reviewed today include:  Coronary Ca score, Lexiscan myoview, 2D echo, event monitor  ASSESSMENT:    1. Agatston coronary artery calcium score greater than 400   2. Primary hypertension   3. Pure hypercholesterolemia   4. Aortic atherosclerosis ([facility])   5. Palpitations      PLAN:  In order of problems listed above:  1.  Coronary Artery Calcifications -noted on chest CT with Ca score 678 -she denies any anginal chest pain -she has chronic DOE related to COPD that is stable -Lexiscan myoview in Feb 21 showed no ischemia -needs aggressive risk factor modification -continue statin and ASA 81mg  daily  2.  HTN -BP borderline controlled on exam today but at home runs 120/50mmHg at home -continue Cardizem 60mg  BID, Toprol XL 25mg  daily and Losartan 50mg  daily -check BMET  3.  HLD -continue Crestor 20mg  daily -her LDL goal is < 70 given her coronary calcifications and aortic atherosclerosis -repeat FLP and ALT  4.  Aortic Atherosclerosis -continue statin -needs aggressive BP control  5.   Palpitations -event monitor showed no arrhythmias -she has not had any further palpitations   Medication Adjustments/Labs and Tests Ordered: Current medicines are reviewed at length with the patient today.  Concerns regarding medicines are outlined above.  Medication changes, Labs and Tests ordered today are listed in the Patient Instructions below.  There are no Patient Instructions on file for this visit.   Signed, Cheryl Him, MD  07/12/2020 9:00 AM    [address], [place], [place]  [address] Phone: [phone]; Fax: [phone]

## 2020-07-12 NOTE — Patient Instructions (Addendum)
Medication Instructions:  Your physician recommends that you continue on your current medications as directed. Please refer to the Current Medication list given to you today.  *If you need a refill on your cardiac medications before your next appointment, please call your pharmacy*  Lab Work: TODAY: FLP and ALT If you have labs (blood work) drawn today and your tests are completely normal, you will receive your results only by: MyChart Message (if you have MyChart) OR A paper copy in the mail If you have any lab test that is abnormal or we need to change your treatment, we will call you to review the results.  Follow-Up: At [facility], you and your health needs are our priority.  As part of our continuing mission to provide you with exceptional heart care, we have created designated Provider Care Teams.  These Care Teams include your primary Cardiologist (physician) and Advanced Practice Providers (APPs -  Physician Assistants and Nurse Practitioners) who all work together to provide you with the care you need, when you need it.  Your next appointment:   1 year(s)  The format for your next appointment:   In Person  Provider:   You may see Traci Turner, MD or one of the following Advanced Practice Providers on your designated Care Team:   Dayna Dunn, PA-C Michele Lenze, PA-C   

## 2020-07-13 ENCOUNTER — Telehealth: Payer: Self-pay

## 2020-07-13 DIAGNOSIS — E7800 Pure hypercholesterolemia, unspecified: Secondary | ICD-10-CM

## 2020-07-13 MED ORDER — ROSUVASTATIN CALCIUM 40 MG PO TABS: 40.0000 mg | ORAL_TABLET | Freq: Every day | ORAL | 3 refills | Status: DC

## 2020-07-13 NOTE — Telephone Encounter (Signed)
The patient has been notified of the result and verbalized understanding.  All questions (if any) were answered. Antonieta Iba, RN 07/13/2020 9:07 AM  Rx has been sent in for Crestor 40 mg. Patient will repeat lab work 12/30.

## 2020-07-13 NOTE — Telephone Encounter (Signed)
-----   Message from Sueanne Margarita, MD sent at 07/12/2020  6:39 PM EST ----- LDL is not at goal.  Increase Crestor to 40mg  daily and repeat FLp and ALT in 6 weeks

## 2020-08-09 ENCOUNTER — Ambulatory Visit: Payer: Medicare Other | Admitting: Hematology & Oncology

## 2020-08-09 ENCOUNTER — Other Ambulatory Visit: Payer: Medicare Other

## 2020-08-21 ENCOUNTER — Other Ambulatory Visit: Payer: Self-pay

## 2020-08-21 ENCOUNTER — Emergency Department (HOSPITAL_COMMUNITY): Payer: Medicare Other

## 2020-08-21 ENCOUNTER — Encounter (HOSPITAL_COMMUNITY): Payer: Self-pay

## 2020-08-21 ENCOUNTER — Inpatient Hospital Stay (HOSPITAL_COMMUNITY)
Admission: EM | Admit: 2020-08-21 | Discharge: 2020-08-28 | DRG: 871 | Disposition: A | Discharge status: Home Health | Payer: Medicare Other | Attending: Internal Medicine | Admitting: Internal Medicine

## 2020-08-21 DIAGNOSIS — R64 Cachexia: Secondary | ICD-10-CM | POA: Diagnosis present

## 2020-08-21 DIAGNOSIS — J449 Chronic obstructive pulmonary disease, unspecified: Secondary | ICD-10-CM | POA: Diagnosis not present

## 2020-08-21 DIAGNOSIS — Z681 Body mass index (BMI) 19 or less, adult: Secondary | ICD-10-CM

## 2020-08-21 DIAGNOSIS — Z7952 Long term (current) use of systemic steroids: Secondary | ICD-10-CM

## 2020-08-21 DIAGNOSIS — Z7982 Long term (current) use of aspirin: Secondary | ICD-10-CM | POA: Diagnosis not present

## 2020-08-21 DIAGNOSIS — J9621 Acute and chronic respiratory failure with hypoxia: Secondary | ICD-10-CM

## 2020-08-21 DIAGNOSIS — E871 Hypo-osmolality and hyponatremia: Secondary | ICD-10-CM | POA: Diagnosis present

## 2020-08-21 DIAGNOSIS — Z20822 Contact with and (suspected) exposure to covid-19: Secondary | ICD-10-CM | POA: Diagnosis present

## 2020-08-21 DIAGNOSIS — R0603 Acute respiratory distress: Secondary | ICD-10-CM

## 2020-08-21 DIAGNOSIS — Z87891 Personal history of nicotine dependence: Secondary | ICD-10-CM | POA: Diagnosis not present

## 2020-08-21 DIAGNOSIS — Z720 Tobacco use: Secondary | ICD-10-CM | POA: Diagnosis present

## 2020-08-21 DIAGNOSIS — J439 Emphysema, unspecified: Secondary | ICD-10-CM | POA: Diagnosis present

## 2020-08-21 DIAGNOSIS — E785 Hyperlipidemia, unspecified: Secondary | ICD-10-CM | POA: Diagnosis present

## 2020-08-21 DIAGNOSIS — J9622 Acute and chronic respiratory failure with hypercapnia: Secondary | ICD-10-CM | POA: Diagnosis present

## 2020-08-21 DIAGNOSIS — E876 Hypokalemia: Secondary | ICD-10-CM | POA: Diagnosis present

## 2020-08-21 DIAGNOSIS — J431 Panlobular emphysema: Secondary | ICD-10-CM | POA: Diagnosis not present

## 2020-08-21 DIAGNOSIS — J9611 Chronic respiratory failure with hypoxia: Secondary | ICD-10-CM | POA: Diagnosis present

## 2020-08-21 DIAGNOSIS — A408 Other streptococcal sepsis: Secondary | ICD-10-CM | POA: Diagnosis not present

## 2020-08-21 DIAGNOSIS — R509 Fever, unspecified: Secondary | ICD-10-CM | POA: Diagnosis not present

## 2020-08-21 DIAGNOSIS — I10 Essential (primary) hypertension: Secondary | ICD-10-CM | POA: Diagnosis present

## 2020-08-21 DIAGNOSIS — Z9981 Dependence on supplemental oxygen: Secondary | ICD-10-CM | POA: Diagnosis not present

## 2020-08-21 DIAGNOSIS — R6510 Systemic inflammatory response syndrome (SIRS) of non-infectious origin without acute organ dysfunction: Secondary | ICD-10-CM | POA: Diagnosis not present

## 2020-08-21 DIAGNOSIS — Z79899 Other long term (current) drug therapy: Secondary | ICD-10-CM

## 2020-08-21 DIAGNOSIS — Z8673 Personal history of transient ischemic attack (TIA), and cerebral infarction without residual deficits: Secondary | ICD-10-CM

## 2020-08-21 DIAGNOSIS — Z79890 Hormone replacement therapy (postmenopausal): Secondary | ICD-10-CM

## 2020-08-21 DIAGNOSIS — I4892 Unspecified atrial flutter: Secondary | ICD-10-CM | POA: Diagnosis present

## 2020-08-21 DIAGNOSIS — D649 Anemia, unspecified: Secondary | ICD-10-CM | POA: Diagnosis present

## 2020-08-21 DIAGNOSIS — E039 Hypothyroidism, unspecified: Secondary | ICD-10-CM | POA: Diagnosis present

## 2020-08-21 DIAGNOSIS — A419 Sepsis, unspecified organism: Secondary | ICD-10-CM | POA: Diagnosis present

## 2020-08-21 DIAGNOSIS — D473 Essential (hemorrhagic) thrombocythemia: Secondary | ICD-10-CM | POA: Diagnosis present

## 2020-08-21 DIAGNOSIS — E861 Hypovolemia: Secondary | ICD-10-CM | POA: Diagnosis present

## 2020-08-21 DIAGNOSIS — J432 Centrilobular emphysema: Secondary | ICD-10-CM | POA: Diagnosis not present

## 2020-08-21 DIAGNOSIS — J441 Chronic obstructive pulmonary disease with (acute) exacerbation: Secondary | ICD-10-CM

## 2020-08-21 LAB — RESP PANEL BY RT-PCR (FLU A&B, COVID) ARPGX2
Influenza A by PCR: NEGATIVE
Influenza B by PCR: NEGATIVE
SARS Coronavirus 2 by RT PCR: NEGATIVE

## 2020-08-21 LAB — CBC WITH DIFFERENTIAL/PLATELET
Abs Immature Granulocytes: 0.03 10*3/uL (ref 0.00–0.07)
Basophils Absolute: 0 10*3/uL (ref 0.0–0.1)
Basophils Relative: 0 %
Eosinophils Absolute: 0 10*3/uL (ref 0.0–0.5)
Eosinophils Relative: 0 %
HCT: 35.6 % — ABNORMAL LOW (ref 36.0–46.0)
Hemoglobin: 12.2 g/dL (ref 12.0–15.0)
Immature Granulocytes: 1 %
Lymphocytes Relative: 11 %
Lymphs Abs: 0.6 10*3/uL — ABNORMAL LOW (ref 0.7–4.0)
MCH: 32.4 pg (ref 26.0–34.0)
MCHC: 34.3 g/dL (ref 30.0–36.0)
MCV: 94.7 fL (ref 80.0–100.0)
Monocytes Absolute: 0.3 10*3/uL (ref 0.1–1.0)
Monocytes Relative: 5 %
Neutro Abs: 4.7 10*3/uL (ref 1.7–7.7)
Neutrophils Relative %: 83 %
Platelets: 191 10*3/uL (ref 150–400)
RBC: 3.76 MIL/uL — ABNORMAL LOW (ref 3.87–5.11)
RDW: 13.9 % (ref 11.5–15.5)
WBC: 5.7 10*3/uL (ref 4.0–10.5)
nRBC: 0 % (ref 0.0–0.2)

## 2020-08-21 LAB — URINALYSIS, ROUTINE W REFLEX MICROSCOPIC
Bilirubin Urine: NEGATIVE
Glucose, UA: 150 mg/dL — AB
Ketones, ur: 5 mg/dL — AB
Leukocytes,Ua: NEGATIVE
Nitrite: NEGATIVE
Protein, ur: 100 mg/dL — AB
Specific Gravity, Urine: 1.013 (ref 1.005–1.030)
pH: 6 (ref 5.0–8.0)

## 2020-08-21 LAB — COMPREHENSIVE METABOLIC PANEL
ALT: 23 U/L (ref 0–44)
AST: 28 U/L (ref 15–41)
Albumin: 3 g/dL — ABNORMAL LOW (ref 3.5–5.0)
Alkaline Phosphatase: 66 U/L (ref 38–126)
Anion gap: 12 (ref 5–15)
BUN: 21 mg/dL (ref 8–23)
CO2: 29 mmol/L (ref 22–32)
Calcium: 8.3 mg/dL — ABNORMAL LOW (ref 8.9–10.3)
Chloride: 85 mmol/L — ABNORMAL LOW (ref 98–111)
Creatinine, Ser: 0.93 mg/dL (ref 0.44–1.00)
GFR, Estimated: >60 mL/min (ref >60)
Glucose, Bld: 157 mg/dL — ABNORMAL HIGH (ref 70–99)
Potassium: 3.1 mmol/L — ABNORMAL LOW (ref 3.5–5.1)
Sodium: 126 mmol/L — ABNORMAL LOW (ref 135–145)
Total Bilirubin: 0.7 mg/dL (ref 0.3–1.2)
Total Protein: 6.4 g/dL — ABNORMAL LOW (ref 6.5–8.1)

## 2020-08-21 LAB — TROPONIN I (HIGH SENSITIVITY): Troponin I (High Sensitivity): 14 ng/L (ref <18)

## 2020-08-21 LAB — LACTIC ACID, PLASMA: Lactic Acid, Venous: 1.1 mmol/L (ref 0.5–1.9)

## 2020-08-21 MED ORDER — ROSUVASTATIN CALCIUM 20 MG PO TABS
40.0000 mg | ORAL_TABLET | Freq: Every day | ORAL | Status: DC
  Administered 2020-08-22 – 2020-08-28 (×7): 40 mg via ORAL
  Filled 2020-08-21 (×7): qty 2

## 2020-08-21 MED ORDER — METRONIDAZOLE 500 MG PO TABS
500.0000 mg | ORAL_TABLET | Freq: Three times a day (TID) | ORAL | Status: DC
Start: 2020-08-21 — End: 2020-08-22
  Administered 2020-08-21 – 2020-08-22 (×3): 500 mg via ORAL
  Filled 2020-08-21 (×3): qty 1

## 2020-08-21 MED ORDER — SODIUM CHLORIDE 0.9 % IV SOLN: INTRAVENOUS | Status: DC

## 2020-08-21 MED ORDER — ASPIRIN EC 81 MG PO TBEC
81.0000 mg | DELAYED_RELEASE_TABLET | Freq: Every day | ORAL | Status: DC
  Administered 2020-08-22 – 2020-08-28 (×7): 81 mg via ORAL
  Filled 2020-08-21 (×7): qty 1

## 2020-08-21 MED ORDER — IPRATROPIUM-ALBUTEROL 0.5-2.5 (3) MG/3ML IN SOLN
3.0000 mL | Freq: Four times a day (QID) | RESPIRATORY_TRACT | Status: DC
  Administered 2020-08-22 (×3): 3 mL via RESPIRATORY_TRACT
  Filled 2020-08-21 (×3): qty 3

## 2020-08-21 MED ORDER — IPRATROPIUM-ALBUTEROL 0.5-2.5 (3) MG/3ML IN SOLN
3.0000 mL | Freq: Once | RESPIRATORY_TRACT | Status: AC
  Administered 2020-08-21: 3 mL via RESPIRATORY_TRACT
  Filled 2020-08-21: qty 3

## 2020-08-21 MED ORDER — ADULT MULTIVITAMIN W/MINERALS CH: 1.0000 | ORAL_TABLET | Freq: Every day | ORAL | Status: DC

## 2020-08-21 MED ORDER — ROSUVASTATIN CALCIUM 20 MG PO TABS: 40.0000 mg | ORAL_TABLET | Freq: Every day | ORAL | Status: DC

## 2020-08-21 MED ORDER — ASPIRIN EC 81 MG PO TBEC: 81.0000 mg | DELAYED_RELEASE_TABLET | Freq: Every day | ORAL | Status: DC

## 2020-08-21 MED ORDER — POTASSIUM CHLORIDE CRYS ER 20 MEQ PO TBCR
40.0000 meq | EXTENDED_RELEASE_TABLET | Freq: Once | ORAL | Status: AC
  Administered 2020-08-21: 40 meq via ORAL
  Filled 2020-08-21: qty 2

## 2020-08-21 MED ORDER — SODIUM CHLORIDE 0.9 % IV SOLN
2.0000 g | Freq: Once | INTRAVENOUS | Status: AC
  Administered 2020-08-21: 2 g via INTRAVENOUS
  Filled 2020-08-21: qty 2

## 2020-08-21 MED ORDER — ACETAMINOPHEN 650 MG RE SUPP: 650.0000 mg | Freq: Four times a day (QID) | RECTAL | Status: DC | PRN

## 2020-08-21 MED ORDER — LACTATED RINGERS IV SOLN: INTRAVENOUS | Status: DC

## 2020-08-21 MED ORDER — SODIUM CHLORIDE 0.9 % IV SOLN
2.0000 g | Freq: Three times a day (TID) | INTRAVENOUS | Status: DC
  Administered 2020-08-22 (×2): 2 g via INTRAVENOUS
  Filled 2020-08-21 (×2): qty 2

## 2020-08-21 MED ORDER — ALBUTEROL SULFATE (2.5 MG/3ML) 0.083% IN NEBU: 2.5000 mg | INHALATION_SOLUTION | Freq: Four times a day (QID) | RESPIRATORY_TRACT | Status: DC | PRN

## 2020-08-21 MED ORDER — POTASSIUM CHLORIDE CRYS ER 20 MEQ PO TBCR
30.0000 meq | EXTENDED_RELEASE_TABLET | Freq: Three times a day (TID) | ORAL | Status: AC
  Administered 2020-08-22 (×2): 30 meq via ORAL
  Filled 2020-08-21 (×2): qty 1

## 2020-08-21 MED ORDER — ONDANSETRON HCL 4 MG PO TABS: 4.0000 mg | ORAL_TABLET | Freq: Four times a day (QID) | ORAL | Status: DC | PRN

## 2020-08-21 MED ORDER — ACETAMINOPHEN 500 MG PO TABS
1000.0000 mg | ORAL_TABLET | Freq: Once | ORAL | Status: AC
  Administered 2020-08-21: 1000 mg via ORAL
  Filled 2020-08-21: qty 2

## 2020-08-21 MED ORDER — ACETAMINOPHEN 325 MG PO TABS
650.0000 mg | ORAL_TABLET | Freq: Four times a day (QID) | ORAL | Status: DC | PRN
  Administered 2020-08-25 – 2020-08-27 (×3): 650 mg via ORAL
  Filled 2020-08-21 (×4): qty 2

## 2020-08-21 MED ORDER — ENOXAPARIN SODIUM 40 MG/0.4ML ~~LOC~~ SOLN
40.0000 mg | SUBCUTANEOUS | Status: DC
  Administered 2020-08-22 – 2020-08-27 (×7): 40 mg via SUBCUTANEOUS
  Filled 2020-08-21 (×7): qty 0.4

## 2020-08-21 MED ORDER — LEVOTHYROXINE SODIUM 88 MCG PO TABS
88.0000 ug | ORAL_TABLET | Freq: Every day | ORAL | Status: DC
  Administered 2020-08-22 – 2020-08-28 (×7): 88 ug via ORAL
  Filled 2020-08-21 (×7): qty 1

## 2020-08-21 MED ORDER — HYDROXYUREA 500 MG PO CAPS
500.0000 mg | ORAL_CAPSULE | ORAL | Status: DC
  Administered 2020-08-23 – 2020-08-27 (×3): 500 mg via ORAL
  Filled 2020-08-21 (×4): qty 1

## 2020-08-21 MED ORDER — ONE-DAILY MULTI VITAMINS PO TABS: 1.0000 | ORAL_TABLET | Freq: Every day | ORAL | Status: DC

## 2020-08-21 MED ORDER — POLYETHYLENE GLYCOL 3350 17 G PO PACK: 17.0000 g | PACK | Freq: Every day | ORAL | Status: DC | PRN

## 2020-08-21 MED ORDER — ONDANSETRON HCL 4 MG/2ML IJ SOLN: 4.0000 mg | Freq: Four times a day (QID) | INTRAMUSCULAR | Status: DC | PRN

## 2020-08-21 MED ORDER — VANCOMYCIN HCL IN DEXTROSE 1-5 GM/200ML-% IV SOLN
1000.0000 mg | Freq: Once | INTRAVENOUS | Status: AC
  Administered 2020-08-21: 1000 mg via INTRAVENOUS
  Filled 2020-08-21: qty 200

## 2020-08-21 MED ORDER — VANCOMYCIN HCL 500 MG/100ML IV SOLN
500.0000 mg | Freq: Two times a day (BID) | INTRAVENOUS | Status: DC
  Administered 2020-08-22: 500 mg via INTRAVENOUS
  Filled 2020-08-21: qty 100

## 2020-08-21 MED ORDER — LEVOTHYROXINE SODIUM 88 MCG PO TABS: 88.0000 ug | ORAL_TABLET | Freq: Every day | ORAL | Status: DC

## 2020-08-21 MED ORDER — SODIUM CHLORIDE 0.9 % IV BOLUS
1000.0000 mL | Freq: Once | INTRAVENOUS | Status: AC
  Administered 2020-08-21: 1000 mL via INTRAVENOUS

## 2020-08-21 MED ORDER — ADULT MULTIVITAMIN W/MINERALS CH
1.0000 | ORAL_TABLET | Freq: Every day | ORAL | Status: DC
  Administered 2020-08-22 – 2020-08-28 (×7): 1 via ORAL
  Filled 2020-08-21 (×7): qty 1

## 2020-08-21 NOTE — ED Provider Notes (Addendum)
[facility] EMERGENCY DEPARTMENT Provider Note   CSN: [id number] Arrival date & time: 08/21/20  1355     History No chief complaint on file.   Cheryl Haley is a 64 y.o. female.  The history is provided by the patient and medical records. No language interpreter was used.     64 year old female significant history of COPD, emphysema, hypertension, thyroid disease, brought here via EMS from home for evaluation of weakness.  Patient report approximately 2 weeks ago she was having shortness of breath.  She was seen at [facility] and was told that she may have had a COPD exacerbation.  She was discharged home with steroid for 10 days.  She did take medication and did report some improvement.  Once her steroid ran out she developed worsening shortness of breath as well as generalized fatigue and overall not feeling well.  Shortness of breath is present both at rest and with exertion.  She does not endorse fever chills no runny nose sneezing or coughing no loss of taste or smell no nausea vomiting diarrhea and no significant pain.  She does notice that her heart rate is racing more than usual.  She has not been vaccinated for COVID-19 and denies any recent sick contact.  She does wear supplemental oxygen at home usually at 2 L.  She normally use nebulizer twice daily but states it has not provided adequate relief.  Past Medical History:  Diagnosis Date  . Anxiety   . 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 COPD (chronic obstructive pulmonary disease) ([place])   . Emphysema lung ([place])   . Essential thrombocythemia ([place]) 09/15/2019  . History of hiatal hernia   . Hx of TIA (transient ischemic attack) and stroke   . Hyperlipidemia   . Hypertension   . Hypothyroidism   . Impaired fasting glucose   . Pleural effusion 05/15/2014   CXR 05/2014:  New small right effusion.    . Pneumonia   . Protein-calorie malnutrition, severe 09/17/2018  . Pulmonary nodules 07/14/2018   07/2017 CT chest  >pulmonary nodules noted.  07/2017 PET scan -Hypermetabolic LUL nodule  09/6604 CT chest >decreased nodule size, new pulmonary nodules noted.  FOB -atypical cells  CT chest 07/2018 >stable nodules to smaller on established nodules . 2 new nodules RUL , RLL >CT chest 3 months   . Thyroid disease   . Tobacco abuse 11/28/2015   Smoked 2 packs of cigarettes daily from teenage years until age 29, quit in 2016   . Unspecified hypothyroidism 09/20/2013  . Viral respiratory infection 09/16/2018    Patient Active Problem List   Diagnosis Date Noted  . Essential thrombocythemia ([place]) 09/15/2019  . Protein-calorie malnutrition, severe 09/17/2018  . Viral respiratory infection 09/16/2018  . Pulmonary nodules 07/14/2018  . Pulmonary nodule, left   . Chronic respiratory failure with hypoxia ([place]) 06/10/2016  . Tobacco abuse 11/28/2015  . Pleural effusion 05/15/2014  . COPD (chronic obstructive pulmonary disease) with emphysema ([place]) 09/20/2013  . COPD with acute exacerbation ([place]) 09/20/2013  . HTN (hypertension) 09/20/2013  . Unspecified hypothyroidism 09/20/2013    Past Surgical History:  Procedure Laterality Date  . ESOPHAGOGASTRODUODENOSCOPY    . VIDEO BRONCHOSCOPY WITH ENDOBRONCHIAL NAVIGATION Left 07/29/2017   Procedure: VIDEO BRONCHOSCOPY WITH ENDOBRONCHIAL NAVIGATION;  Surgeon: Collene Gobble, MD;  Location: [facility] OR;  Service: Thoracic;  Laterality: Left;     OB History   No obstetric history on file.     Family History  Problem Relation Age of Onset  . Emphysema Father   . Heart disease Father   . Emphysema Brother   . Emphysema Sister   . Emphysema Sister   . Heart disease Mother   . Heart disease Sister   . Heart disease Brother   . Heart disease Brother   . Congestive Heart Failure Sister   . Congestive Heart Failure Sister   . Cancer Sister        lung  . Cancer Sister        breast    Social History   Tobacco Use  . Smoking status: Former Smoker    Packs/day:  2.00    Years: 40.00    Pack years: 80.00    Types: Cigarettes    Quit date: 03/16/2017    Years since quitting: 3.4  . Smokeless tobacco: Never Used  . Tobacco comment:    Vaping Use  . Vaping Use: Never used  Substance Use Topics  . Alcohol use: Yes    Alcohol/week: 14.0 standard drinks    Types: 14 Cans of beer per week  . Drug use: No    Home Medications Prior to Admission medications   Medication Sig Start Date End Date Taking? Authorizing Provider  aspirin EC 81 MG tablet Take 81 mg daily by mouth.     [provider]  diltiazem (CARDIZEM) 60 MG tablet Take 1 tablet (60 mg total) by mouth 2 (two) times daily. 09/19/18   Bonnell Public, MD  EUTHYROX 88 MCG tablet Take by mouth. 11/04/19   [provider]  losartan (COZAAR) 50 MG tablet Take 50 mg by mouth daily. 12/29/18   [provider]  metoprolol succinate (TOPROL-XL) 25 MG 24 hr tablet Take 25 mg by mouth daily. 11/10/18   [provider]  mometasone-formoterol (DULERA) 100-5 MCG/ACT AERO Inhale 2 puffs into the lungs 2 (two) times daily. 12/06/19   Julian Hy, DO  Multiple Vitamin (MULTIVITAMIN) tablet Take 1 tablet by mouth daily.    [provider]  OXYGEN Inhale 2 L into the lungs continuous.    [provider]  rosuvastatin (CRESTOR) 40 MG tablet Take 1 tablet (40 mg total) by mouth daily. 07/13/20   Sueanne Margarita, MD  tiotropium (SPIRIVA HANDIHALER) 18 MCG inhalation capsule Place 1 capsule (18 mcg total) into inhaler and inhale daily. 06/06/20   Parrett, Fonnie Mu, NP    Allergies    Patient has no known allergies.  Review of Systems   Review of Systems  All other systems reviewed and are negative.   Physical Exam Updated Vital Signs BP 115/63 (BP Location: Right Arm)   Pulse (!) 125   Temp 100 F (37.8 C) (Oral)   Resp 18   SpO2 98%   Physical Exam Vitals and nursing note reviewed.  Constitutional:      General: She is not in acute distress.     Appearance: She is well-developed and well-nourished.     Comments: Elderly female wearing supplemental oxygen appears to be in no acute discomfort  HENT:     Head: Atraumatic.  Eyes:     Conjunctiva/sclera: Conjunctivae normal.  Cardiovascular:     Rate and Rhythm: Tachycardia present.     Heart sounds: Normal heart sounds.  Pulmonary:     Comments: Decreased breath sounds without overt wheezes, rales, or rhonchi heard. Abdominal:     Palpations: Abdomen is soft.     Tenderness: There is no abdominal  tenderness.  Musculoskeletal:     Cervical back: Neck supple.     Right lower leg: No edema.     Left lower leg: No edema.  Skin:    Findings: No rash.  Neurological:     Mental Status: She is alert and oriented to person, place, and time.  Psychiatric:        Mood and Affect: Mood and affect and mood normal.     ED Results / Procedures / Treatments   Labs (all labs ordered are listed, but only abnormal results are displayed) Labs Reviewed  COMPREHENSIVE METABOLIC PANEL - Abnormal; Notable for the following components:      Result Value   Sodium 126 (*)    Potassium 3.1 (*)    Chloride 85 (*)    Glucose, Bld 157 (*)    Calcium 8.3 (*)    Total Protein 6.4 (*)    Albumin 3.0 (*)    All other components within normal limits  CBC WITH DIFFERENTIAL/PLATELET - Abnormal; Notable for the following components:   RBC 3.76 (*)    HCT 35.6 (*)    Lymphs Abs 0.6 (*)    All other components within normal limits  RESP PANEL BY RT-PCR (FLU A&B, COVID) ARPGX2  CULTURE, BLOOD (ROUTINE X 2)  CULTURE, BLOOD (ROUTINE X 2)  LACTIC ACID, PLASMA  LACTIC ACID, PLASMA  URINALYSIS, ROUTINE W REFLEX MICROSCOPIC  COMPREHENSIVE METABOLIC PANEL  TROPONIN I (HIGH SENSITIVITY)    EKG EKG Interpretation  Date/Time:  Tuesday August 21 2020 14:00:03 EST Ventricular Rate:  124 PR Interval:    QRS Duration: 96 QT Interval:  306 QTC Calculation: 439 R Axis:   81 Text  Interpretation: Atrial flutter with 3:1 A-V conduction Nonspecific ST abnormality Abnormal ECG Confirmed by Veryl Speak [id number]) on 08/21/2020 4:58:21 PM   ED ECG REPORT   Date: 08/21/2020  Rate: 124  Rhythm: atrial flutter  QRS Axis: normal  Intervals: normal  ST/T Wave abnormalities: nonspecific ST changes  Conduction Disutrbances:none  Narrative Interpretation:   Old EKG Reviewed: changes noted  I have personally reviewed the EKG tracing and agree with the computerized printout as noted.   Radiology DG Chest 2 View  Result Date: 08/21/2020 CLINICAL DATA:  Cough EXAM: CHEST - 2 VIEW COMPARISON:  CT 06/28/2020.  Radiography 04/18/2019. FINDINGS: Heart size is normal. Chronic aortic atherosclerosis. Chronic pulmonary emphysema with hyperinflation and interstitial scarring. No sign of focal infiltrate, mass, effusion or collapse. IMPRESSION: Emphysema. No active process evident. Aortic atherosclerosis. Electronically Signed   By: Nelson Chimes M.D.   On: 08/21/2020 14:31    Procedures .Critical Care Performed by: Domenic Moras, PA-C Authorized by: Domenic Moras, PA-C   Critical care provider statement:    Critical care time (minutes):  35   Critical care was time spent personally by me on the following activities:  Discussions with consultants, evaluation of patient's response to treatment, examination of patient, ordering and performing treatments and interventions, ordering and review of laboratory studies, ordering and review of radiographic studies, pulse oximetry, re-evaluation of patient's condition, obtaining history from patient or surrogate and review of old charts   (including critical care time)  Medications Ordered in ED Medications  lactated ringers infusion ( Intravenous New Bag/Given 08/21/20 1620)  vancomycin (VANCOCIN) IVPB 1000 mg/200 mL premix (has no administration in time range)  metroNIDAZOLE (FLAGYL) tablet 500 mg (500 mg Oral Given 08/21/20 1621)  sodium  chloride 0.9 % bolus 1,000 mL (1,000 mLs Intravenous New  Bag/Given 08/21/20 1548)  potassium chloride SA (KLOR-CON) CR tablet 40 mEq (40 mEq Oral Given 08/21/20 1549)  acetaminophen (TYLENOL) tablet 1,000 mg (1,000 mg Oral Given 08/21/20 1552)  ceFEPIme (MAXIPIME) 2 g in sodium chloride 0.9 % 100 mL IVPB (2 g Intravenous New Bag/Given 08/21/20 1618)  ipratropium-albuterol (DUONEB) 0.5-2.5 (3) MG/3ML nebulizer solution 3 mL (3 mLs Nebulization Given 08/21/20 1624)    ED Course  I have reviewed the triage vital signs and the nursing notes.  Pertinent labs & imaging results that were available during my care of the patient were reviewed by me and considered in my medical decision making (see chart for details).    MDM Rules/Calculators/A&P                          BP 111/64   Pulse (!) 103   Temp (!) 101.6 F (38.7 C) (Rectal)   Resp (!) 22   SpO2 100%   Final Clinical Impression(s) / ED Diagnoses Final diagnoses:  Fever, unknown origin  Acute respiratory distress  Hyponatremia  Hypokalemia  SIRS (systemic inflammatory response syndrome) ([place])    Rx / DC Orders ED Discharge Orders    None     3:18 PM Patient previously seen in the ED for COPD exacerbation was on steroid for about 10 days and finished 4 days ago.  Symptoms did improve but now she endorsed worsening shortness of breath and generalized fatigue.  She does have an oral temperature of 100, she is tachycardic with heart rate of 125, blood pressure is 113/63, and she is at 98% on 2 L of oxygen.  She has normal lactic acid, chest x-ray shows emphysema without focal infiltrate or effusion.  She has normal WBC, normal lactic acid.  Since patient has not been vaccinated for COVID-19, respiratory panel have been obtained.  I am concerned for Covid infection causing her symptoms  3:46 PM Rectal temp is 101.6 and patient remains tachycardic.  Will give Tylenol for fever, code sepsis initiated, will hold off on fluid  resuscitation as her lactic acid is normal.  3:58 PM Respiratory panel was negative.  Since patient has a sodium of 126, potassium 3.1, chloride of 85, as well as febrile tachycardia and tachypnea, code sepsis initiated, patient will receive broad-spectrum antibiotic, replacements of her potassium, IV fluid for sodium and will consult for admission.  4:55 PM Appreciate consultation from [facility] hospitalist, Dr. Burnett Sheng, who agrees to see and admit patient for fever of unknown origin, but likely respiratory source.  UA is currently pending, troponin is pending as well.  Patient is aware and agrees with plan. No headache to suggest meningitis  Cheryl Haley was evaluated in Emergency Department on 08/21/2020 for the symptoms described in the history of present illness. She was evaluated in the context of the global COVID-19 pandemic, which necessitated consideration that the patient might be at risk for infection with the SARS-CoV-2 virus that causes COVID-19. Institutional protocols and algorithms that pertain to the evaluation of patients at risk for COVID-19 are in a state of rapid change based on information released by regulatory bodies including the CDC and federal and state organizations. These policies and algorithms were followed during the patient's care in the ED.    Domenic Moras, PA-C 08/21/20 1658    Domenic Moras, PA-C 08/21/20 1705    Veryl Speak, MD 08/21/20 [phone]

## 2020-08-21 NOTE — ED Triage Notes (Signed)
Patient complains of increased weakness after being discharged from [facility] after being admitted for COPD exacerbation. Patient on home oxygen 2L all the time. Patient finished steroids this past week. Denies fever, alert and oriented, appears weak and fatigued

## 2020-08-21 NOTE — H&P (Signed)
predninsone 10d, now fatigue, no appetite, fevers, ^SOB, home O2 2L > temp 101.6, inc HR, aflutter, CXR okay, covid test, broad - spec, IVF , nebs , steroids   [facility] History & Physical  Rob Doctor, hospital MD  [name] 08/21/2020  Chief Complaint: SOB, gen weakness HPI: The patient is a 63 y.o. year-old w/ hx of tobacco abuse, PNA, HTN, HL, hx CVA/ TIA, COPD/ emphysema brought to ED by EMS for eval of gen'd weakness, SOB and cough.  Seen at outside hospital recently and told may have COPD exacerbation, she rec'd Rx for prednisone for 10 days. She took the medication and was feeling a little better , but then again felt worsening SOB and gen'd fatigue. No chills, congestion, no n/v/abd pain. Heart is racing, "as usual". COVID here is negative.  Nebs are helping despite 2x / day use. In ED HR was high 125 > 102, RR high 18- 24, temp 101.  Labs showed Na 12  K 3.1  Glu 157  Cr 0.93  Alb 3.0  WBC 5K  Hb 12. Pt was given duoneb, po flagyl, po KDur 40, NaCl 1L bolus, LR at 150/hr, IV vanc/ cefepime  1 gm/ 2 gm respectively. Asked to see for admission.   Pt says main issue is SOB and severe fatigue, and poor appetite, going on for "a few weeks".  She got better on 10d pred course, but the next day felt worse again. No fevers, chills , no cough / no prod cough, no chest pain, no myalgias.  Last hosp stay was about 2 yrs ago, COPD issue per the pt.   Says she "already feels better" and wants something to eat.   Pt is divorced, no kids, lives w/ a sister, another sister (at bedside) is down the road.    No hx heart disease, cancer, DVT.   Takes hydroxyurea most for essential thrombocytopenia since Feb 2021, f/b Dr Maylon Peppers of oncology.    ROS  denies CP  no joint pain   no HA  no blurry vision  no rash  no diarrhea  no nausea/ vomiting  no dysuria  no difficulty voiding  no change in urine color   Past Medical History  Past Medical History:  Diagnosis Date  . Anxiety   .  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 COPD (chronic obstructive pulmonary disease) ([place])   . Emphysema lung ([place])   . Essential thrombocythemia ([place]) 09/15/2019  . History of hiatal hernia   . Hx of TIA (transient ischemic attack) and stroke   . Hyperlipidemia   . Hypertension   . Hypothyroidism   . Impaired fasting glucose   . Pleural effusion 05/15/2014   CXR 05/2014:  New small right effusion.    . Pneumonia   . Protein-calorie malnutrition, severe 09/17/2018  . Pulmonary nodules 07/14/2018   07/2017 CT chest >pulmonary nodules noted.  07/2017 PET scan -Hypermetabolic LUL nodule  11/9673 CT chest >decreased nodule size, new pulmonary nodules noted.  FOB -atypical cells  CT chest 07/2018 >stable nodules to smaller on established nodules . 2 new nodules RUL , RLL >CT chest 3 months   . Thyroid disease   . Tobacco abuse 11/28/2015   Smoked 2 packs of cigarettes daily from teenage years until age 69, quit in 2016   . Unspecified hypothyroidism 09/20/2013  . Viral respiratory infection 09/16/2018   Past Surgical History  Past Surgical History:  Procedure Laterality Date  . ESOPHAGOGASTRODUODENOSCOPY    .  VIDEO BRONCHOSCOPY WITH ENDOBRONCHIAL NAVIGATION Left 07/29/2017   Procedure: VIDEO BRONCHOSCOPY WITH ENDOBRONCHIAL NAVIGATION;  Surgeon: Collene Gobble, MD;  Location: [facility] OR;  Service: Thoracic;  Laterality: Left;   Family History  Family History  Problem Relation Age of Onset  . Emphysema Father   . Heart disease Father   . Emphysema Brother   . Emphysema Sister   . Emphysema Sister   . Heart disease Mother   . Heart disease Sister   . Heart disease Brother   . Heart disease Brother   . Congestive Heart Failure Sister   . Congestive Heart Failure Sister   . Cancer Sister        lung  . Cancer Sister        breast   Social History  reports that she quit smoking about 3 years ago. Her smoking use included cigarettes. She has a 80.00 pack-year smoking history. She has never used smokeless  tobacco. She reports current alcohol use of about 14.0 standard drinks of alcohol per week. She reports that she does not use drugs. Allergies No Known Allergies Home medications Prior to Admission medications   Medication Sig Start Date End Date Taking? Authorizing Provider  albuterol (PROVENTIL) (2.5 MG/3ML) 0.083% nebulizer solution Take 2.5 mg by nebulization every 6 (six) hours as needed for wheezing or shortness of breath. 08/07/20  Yes [provider]  aspirin EC 81 MG tablet Take 81 mg daily by mouth.    Yes [provider]  diltiazem (CARDIZEM) 60 MG tablet Take 1 tablet (60 mg total) by mouth 2 (two) times daily. 09/19/18  Yes Bonnell Public, MD  EUTHYROX 88 MCG tablet Take 88 mcg by mouth daily before breakfast. 11/04/19  Yes [provider]  hydroxyurea (HYDREA) 500 MG capsule Take 500 mg by mouth every other day. 07/30/20  Yes [provider]  losartan (COZAAR) 50 MG tablet Take 50 mg by mouth daily. 12/29/18  Yes [provider]  metoprolol succinate (TOPROL-XL) 25 MG 24 hr tablet Take 25 mg by mouth daily. 11/10/18  Yes [provider]  mometasone-formoterol (DULERA) 100-5 MCG/ACT AERO Inhale 2 puffs into the lungs 2 (two) times daily. 12/06/19  Yes Julian Hy, DO  Multiple Vitamin (MULTIVITAMIN) tablet Take 1 tablet by mouth daily.   Yes [provider]  OXYGEN Inhale 2 L into the lungs continuous.   Yes [provider]  rosuvastatin (CRESTOR) 40 MG tablet Take 1 tablet (40 mg total) by mouth daily. 07/13/20  Yes Turner, Eber Hong, MD  tiotropium (SPIRIVA HANDIHALER) 18 MCG inhalation capsule Place 1 capsule (18 mcg total) into inhaler and inhale daily. 06/06/20  Yes Parrett, Tammy S, NP  predniSONE (DELTASONE) 20 MG tablet Take 20 mg by mouth 2 (two) times daily. Patient not taking: Reported on 08/21/2020 08/08/20   [provider]       Exam Gen pleasant, nasal O2, no distress No rash, cyanosis  or gangrene Sclera anicteric, throat clear  No jvd or bruits Chest moving very little air posteriorly, occ wheezes and crackles, no bronchial BS RRR no MRG Abd soft ntnd no mass or ascites +bs GU  defer MS no joint effusions or deformity Ext no leg or UE edema, no wounds or ulcers Neuro is alert, Ox 3 , nf    Home meds:  - asa 81/ crestor 40  - cardizem 60 bid/ losartan 50 qd/ metoprolol xl 25 qd  - dulera 2 puff bid/ O2 2L  [place]/ spiriva 18 ug qd  - prednisone 20 bid  - euthyrox 88 ug qd/ hydroxyurea 500 q other day  - prn's/ vitamins/ supplements     HR 102- 123   RR 18, 22, 24   Temp 101.6 F,  BP 115/63     2L 98%       EKG - Heart rate 124 beats/ minute. Atrial flutter with 3:1 A-V conduction. Nonspecific ST abnormality.  Abnormal ECG      CXR > IMPRESSION: Heart size is normal. Chronic aortic atherosclerosis. Chronic pulmonary emphysema with hyperinflation and interstitial scarring. No sign of focal infiltrate, mass, effusion or collapse.    Na 12  K 3.1  Glu 157  Cr 0.93  Alb 3.0  WBC 5K  Hb 12     Assessment/ Plan: 1. Fever r/o sepsis - code sepsis initiated in ED d/t fever, Rudean Hitt, ^RR. Flu/ covid are negative. CXR no infiltrates , no cough/ no prod cough. Blood cx's sent. May have some other viral resp infection. Will check UA, though no symptoms. Cont broad-spec IV abx for now w/ po flagyl/ IV cefepime/ IV vanc.  2. COPD / chron resp failure on home O2- no steroids given in ED as not hypoxic or in resp distress. Has sig COPD on exam w/ very poor air movement. Last hosp stay was 2 yrs ago. On home O2 at 2L [place]. Cont O2 here. Use duoneb here and O2 support as needed.  3. Hyponatremia - hypovolemic, has not been eating. Give isotonic IVF's. F/u in am.   4. Hypokalemia - replete w/ po KCl.   5. HTN - BP's low-normal, will hold BP meds and reintroduce if BP's come up 6. Tachycardia - f/b Dr Radford Pax for palpitations, event monitor 2021 did not pick up any arrhythmia. EKG here read as  "atrial flutter 3:1", not sure it looks kind of like sinus tach to me. Will repeat EKG.  No symptoms.  7. Essential thrombocythemia - cont hydrea 8. HL - statin 9. Hx TIA - cont asa       Kelly Splinter  MD 08/21/2020, 4:55 PM

## 2020-08-21 NOTE — Progress Notes (Signed)
Pharmacy Antibiotic Note  Cheryl Haley is a 64 y.o. female admitted on 08/21/2020 with an infection of unknown source.  Pharmacy has been consulted for Cefepime and vancomycin dosing.  WBC wnl. LA 1.1. SCr wnl   Plan: -Vancomycin 1 gm IV load followed by vancomycin 500 mg IV Q 12 hours per traditional dosing nomogram  -Cefepime 2 gm IV Q 8 hours -Monitor CBC, renal fx, cultures and clinical progress -VT at SS      Temp (24hrs), Avg:100.8 F (38.2 C), Min:100 F (37.8 C), Max:101.6 F (38.7 C)  Recent Labs  Lab 08/21/20 1406  WBC 5.7  CREATININE 0.93  LATICACIDVEN 1.1    CrCl cannot be calculated (Unknown ideal weight.).    No Known Allergies  Antimicrobials this admission: Cefepime 12/21 >>  Vanc 12/21 >>   Dose adjustments this admission:  Microbiology results: 12/21 BCx:    Thank you for allowing pharmacy to be a part of this patient's care.  Albertina Parr, PharmD., BCPS, BCCCP Clinical Pharmacist Please refer to [facility] for unit-specific pharmacist

## 2020-08-22 ENCOUNTER — Inpatient Hospital Stay (HOSPITAL_COMMUNITY): Payer: Medicare Other

## 2020-08-22 DIAGNOSIS — R509 Fever, unspecified: Secondary | ICD-10-CM

## 2020-08-22 DIAGNOSIS — J449 Chronic obstructive pulmonary disease, unspecified: Secondary | ICD-10-CM

## 2020-08-22 DIAGNOSIS — E861 Hypovolemia: Secondary | ICD-10-CM

## 2020-08-22 DIAGNOSIS — J432 Centrilobular emphysema: Secondary | ICD-10-CM

## 2020-08-22 DIAGNOSIS — E871 Hypo-osmolality and hyponatremia: Secondary | ICD-10-CM | POA: Diagnosis not present

## 2020-08-22 DIAGNOSIS — R64 Cachexia: Secondary | ICD-10-CM

## 2020-08-22 DIAGNOSIS — I4892 Unspecified atrial flutter: Secondary | ICD-10-CM

## 2020-08-22 DIAGNOSIS — J9611 Chronic respiratory failure with hypoxia: Secondary | ICD-10-CM | POA: Diagnosis not present

## 2020-08-22 DIAGNOSIS — R0603 Acute respiratory distress: Secondary | ICD-10-CM

## 2020-08-22 LAB — COMPREHENSIVE METABOLIC PANEL
ALT: 20 U/L (ref 0–44)
AST: 26 U/L (ref 15–41)
Albumin: 2.6 g/dL — ABNORMAL LOW (ref 3.5–5.0)
Alkaline Phosphatase: 53 U/L (ref 38–126)
Anion gap: 10 (ref 5–15)
BUN: 13 mg/dL (ref 8–23)
CO2: 27 mmol/L (ref 22–32)
Calcium: 7.9 mg/dL — ABNORMAL LOW (ref 8.9–10.3)
Chloride: 96 mmol/L — ABNORMAL LOW (ref 98–111)
Creatinine, Ser: 0.74 mg/dL (ref 0.44–1.00)
GFR, Estimated: >60 mL/min (ref >60)
Glucose, Bld: 112 mg/dL — ABNORMAL HIGH (ref 70–99)
Potassium: 3.3 mmol/L — ABNORMAL LOW (ref 3.5–5.1)
Sodium: 133 mmol/L — ABNORMAL LOW (ref 135–145)
Total Bilirubin: 0.7 mg/dL (ref 0.3–1.2)
Total Protein: 5.6 g/dL — ABNORMAL LOW (ref 6.5–8.1)

## 2020-08-22 LAB — CBC
HCT: 30.3 % — ABNORMAL LOW (ref 36.0–46.0)
Hemoglobin: 10.3 g/dL — ABNORMAL LOW (ref 12.0–15.0)
MCH: 32.2 pg (ref 26.0–34.0)
MCHC: 34 g/dL (ref 30.0–36.0)
MCV: 94.7 fL (ref 80.0–100.0)
Platelets: 136 10*3/uL — ABNORMAL LOW (ref 150–400)
RBC: 3.2 MIL/uL — ABNORMAL LOW (ref 3.87–5.11)
RDW: 13.9 % (ref 11.5–15.5)
WBC: 4.1 10*3/uL (ref 4.0–10.5)
nRBC: 0 % (ref 0.0–0.2)

## 2020-08-22 LAB — BLOOD CULTURE ID PANEL (REFLEXED) - BCID2

## 2020-08-22 LAB — RESPIRATORY PANEL BY PCR

## 2020-08-22 LAB — CORTISOL: Cortisol, Plasma: 13.1 ug/dL

## 2020-08-22 LAB — HIV ANTIBODY (ROUTINE TESTING W REFLEX): HIV Screen 4th Generation wRfx: NONREACTIVE

## 2020-08-22 LAB — TROPONIN I (HIGH SENSITIVITY): Troponin I (High Sensitivity): 14 ng/L (ref <18)

## 2020-08-22 LAB — LACTIC ACID, PLASMA: Lactic Acid, Venous: 1.1 mmol/L (ref 0.5–1.9)

## 2020-08-22 LAB — MAGNESIUM: Magnesium: 1.9 mg/dL (ref 1.7–2.4)

## 2020-08-22 LAB — TSH: TSH: 0.372 u[IU]/mL (ref 0.350–4.500)

## 2020-08-22 MED ORDER — SODIUM CHLORIDE 0.9 % IV SOLN
2.0000 g | INTRAVENOUS | Status: DC
  Administered 2020-08-22: 2 g via INTRAVENOUS
  Filled 2020-08-22: qty 20

## 2020-08-22 MED ORDER — ARFORMOTEROL TARTRATE 15 MCG/2ML IN NEBU
15.0000 ug | INHALATION_SOLUTION | Freq: Two times a day (BID) | RESPIRATORY_TRACT | Status: DC
  Administered 2020-08-22 – 2020-08-28 (×12): 15 ug via RESPIRATORY_TRACT
  Filled 2020-08-22 (×12): qty 2

## 2020-08-22 MED ORDER — BUDESONIDE 0.25 MG/2ML IN SUSP
0.2500 mg | Freq: Two times a day (BID) | RESPIRATORY_TRACT | Status: DC
  Administered 2020-08-22 – 2020-08-23 (×2): 0.25 mg via RESPIRATORY_TRACT
  Filled 2020-08-22 (×2): qty 2

## 2020-08-22 MED ORDER — IOHEXOL 350 MG/ML SOLN
100.0000 mL | Freq: Once | INTRAVENOUS | Status: AC | PRN
  Administered 2020-08-22: 51 mL via INTRAVENOUS

## 2020-08-22 MED ORDER — OSELTAMIVIR PHOSPHATE 30 MG PO CAPS
30.0000 mg | ORAL_CAPSULE | Freq: Two times a day (BID) | ORAL | Status: DC
  Filled 2020-08-22 (×2): qty 1

## 2020-08-22 MED ORDER — DILTIAZEM HCL 60 MG PO TABS
60.0000 mg | ORAL_TABLET | Freq: Two times a day (BID) | ORAL | Status: DC
  Administered 2020-08-22 – 2020-08-27 (×10): 60 mg via ORAL
  Filled 2020-08-22 (×10): qty 1

## 2020-08-22 MED ORDER — REVEFENACIN 175 MCG/3ML IN SOLN
175.0000 ug | Freq: Every day | RESPIRATORY_TRACT | Status: DC
  Administered 2020-08-22 – 2020-08-27 (×6): 175 ug via RESPIRATORY_TRACT
  Filled 2020-08-22 (×7): qty 3

## 2020-08-22 MED ORDER — POTASSIUM CHLORIDE CRYS ER 20 MEQ PO TBCR
40.0000 meq | EXTENDED_RELEASE_TABLET | ORAL | Status: AC
  Administered 2020-08-22 (×2): 40 meq via ORAL
  Filled 2020-08-22 (×2): qty 2

## 2020-08-22 MED ORDER — ALBUTEROL SULFATE (2.5 MG/3ML) 0.083% IN NEBU: 2.5000 mg | INHALATION_SOLUTION | RESPIRATORY_TRACT | Status: DC | PRN

## 2020-08-22 NOTE — Progress Notes (Signed)
PHARMACY - PHYSICIAN COMMUNICATION CRITICAL VALUE ALERT - BLOOD CULTURE IDENTIFICATION (BCID)  Cheryl Haley is an 64 y.o. female who presented to [facility] on 08/21/2020 with a chief complaint of generalized weakness, SOB and cough.  Patient was given vancomycin and cefepime in the ED, and were not continued due to possible viral infection exacerbating patient's underlying dyspnea.  Assessment:  1 of 4 BCx bottle is growing Strep spp.  Tmax 100.5, WBC WNL, LA 1.1  Name of physician (or Provider) Contacted: Dr. Clearence Ped  Current antibiotics: none  Changes to prescribed antibiotics recommended:  Recommendations accepted by provider - hold off on resuming abx.  If additional info warrants treatment, recommended CTX 2gm IV Q24H.  Results for orders placed or performed during the hospital encounter of 08/21/20  Blood Culture ID Panel (Reflexed) (Collected: 08/21/2020  4:23 PM)  Result Value Ref Range   Enterococcus faecalis NOT DETECTED NOT DETECTED   Enterococcus Faecium NOT DETECTED NOT DETECTED   Listeria monocytogenes NOT DETECTED NOT DETECTED   Staphylococcus species NOT DETECTED NOT DETECTED   Staphylococcus aureus (BCID) NOT DETECTED NOT DETECTED   Staphylococcus epidermidis NOT DETECTED NOT DETECTED   Staphylococcus lugdunensis NOT DETECTED NOT DETECTED   Streptococcus species DETECTED (A) NOT DETECTED   Streptococcus agalactiae NOT DETECTED NOT DETECTED   Streptococcus pneumoniae NOT DETECTED NOT DETECTED   Streptococcus pyogenes NOT DETECTED NOT DETECTED   A.calcoaceticus-baumannii NOT DETECTED NOT DETECTED   Bacteroides fragilis NOT DETECTED NOT DETECTED   Enterobacterales NOT DETECTED NOT DETECTED   Enterobacter cloacae complex NOT DETECTED NOT DETECTED   Escherichia coli NOT DETECTED NOT DETECTED   Klebsiella aerogenes NOT DETECTED NOT DETECTED   Klebsiella oxytoca NOT DETECTED NOT DETECTED   Klebsiella pneumoniae NOT DETECTED NOT DETECTED   Proteus species NOT  DETECTED NOT DETECTED   Salmonella species NOT DETECTED NOT DETECTED   Serratia marcescens NOT DETECTED NOT DETECTED   Haemophilus influenzae NOT DETECTED NOT DETECTED   Neisseria meningitidis NOT DETECTED NOT DETECTED   Pseudomonas aeruginosa NOT DETECTED NOT DETECTED   Stenotrophomonas maltophilia NOT DETECTED NOT DETECTED   Candida albicans NOT DETECTED NOT DETECTED   Candida auris NOT DETECTED NOT DETECTED   Candida glabrata NOT DETECTED NOT DETECTED   Candida krusei NOT DETECTED NOT DETECTED   Candida parapsilosis NOT DETECTED NOT DETECTED   Candida tropicalis NOT DETECTED NOT DETECTED   Cryptococcus neoformans/gattii NOT DETECTED NOT DETECTED    Donnarae Rae D. Mina Marble, PharmD, BCPS, [place] 08/22/2020, 10:47 PM

## 2020-08-22 NOTE — Progress Notes (Addendum)
PROGRESS NOTE    Cheryl Haley   [id number]  DOB: [birth year]  DOA: 08/21/2020 PCP: [name], MD   Brief Narrative:  Cheryl Haley is a 64 y.o. year-old w/ hx of tobacco abuse, PNA, HTN, HL, hx CVA/ TIA, COPD/ emphysema brought to ED by EMS for eval of gen'd weakness, SOB and cough.  Seen at outside hospital recently and told may have COPD exacerbation, she rec'd Rx for prednisone for 10 days. She took the medication and was feeling a little better , but then again felt worsening dyspnea. She presented to the ED and was found to have a temp of 101.6. Given Vanc and Cefepime and admitted to the hospital.    Subjective: Still very short of breath and feels heart is racing. Dyspnea is worse when she walks.     Assessment & Plan:   Principal Problem: Acute on chronic respiratory failure with fever /   COPD (chronic obstructive pulmonary disease) with emphysema   SIRS - CT of the chest ordered to look for pneumonia and is unrevealing for an infection- No PE found either - I have stopped antibiotics and will follow blood cultures - Temp 100.5 today - ? If viral infection exacerbating her underlying dyspnea- there is no wheezing and thus at this time I am not keen on resuming steroids - appreciate pulmonary eval- cont chronic meds for COPD- she appears to be end stage COPD to me  Active Problems:  Sinus tachycardia -home Cardizem and Toprol are on hold for low BPs - will resume Cardizem today and follow BP  Hypovolemia - given IVF- follow oral intake today  Cachexia (? Related to COPD) - add dietary supplements- nutrition consulted today  Hypokalemia - replaced - follow     Hypothyroidism -cont Synthroid    Tobacco abuse       Essential thrombocythemia - cont Hydroxyurea     Time spent in minutes: 35 DVT prophylaxis: enoxaparin (LOVENOX) injection 40 mg Start: 08/21/20 2200  Code Status: Full code Family Communication:  Disposition Plan:  Status is:  Inpatient  Remains inpatient appropriate because:Ongoing diagnostic testing needed not appropriate for outpatient work up   Dispo: The patient is from: Home              Anticipated d/c is to: Home              Anticipated d/c date is: 1 day              Patient currently is not medically stable to d/c.   Consultants:   pulmonary Procedures:    Antimicrobials:  Anti-infectives (From admission, onward)   Start     Dose/Rate Route Frequency Ordered Stop   08/22/20 1230  oseltamivir (TAMIFLU) capsule 30 mg  Status:  Discontinued        30 mg Oral 2 times daily 08/22/20 1142 08/22/20 1200   08/22/20 0500  vancomycin (VANCOREADY) IVPB 500 mg/100 mL  Status:  Discontinued        500 mg 100 mL/hr over 60 Minutes Intravenous Every 12 hours 08/21/20 1659 08/22/20 1142   08/22/20 0000  ceFEPIme (MAXIPIME) 2 g in sodium chloride 0.9 % 100 mL IVPB  Status:  Discontinued        2 g 200 mL/hr over 30 Minutes Intravenous Every 8 hours 08/21/20 1659 08/22/20 1142   08/21/20 1600  ceFEPIme (MAXIPIME) 2 g in sodium chloride 0.9 % 100 mL IVPB        2 g  200 mL/hr over 30 Minutes Intravenous  Once 08/21/20 1557 08/21/20 1659   08/21/20 1600  vancomycin (VANCOCIN) IVPB 1000 mg/200 mL premix        1,000 mg 200 mL/hr over 60 Minutes Intravenous  Once 08/21/20 1557 08/21/20 1811   08/21/20 1600  metroNIDAZOLE (FLAGYL) tablet 500 mg  Status:  Discontinued        500 mg Oral Every 8 hours 08/21/20 1557 08/22/20 1150       Objective: Vitals:   08/22/20 1306 08/22/20 1535 08/22/20 1535 08/22/20 1804  BP:  118/78 118/78 118/78  Pulse:  (!) 113 (!) 122 (!) 122  Resp:  20 18 (!) 22  Temp:  98.4 F (36.9 C) 98.3 F (36.8 C) 97.7 F (36.5 C)  TempSrc:   Oral   SpO2: 97%  95%   Weight:        Intake/Output Summary (Last 24 hours) at 08/22/2020 1831 Last data filed at 08/22/2020 1804 Gross per 24 hour  Intake 2404.94 ml  Output 825 ml  Net 1579.94 ml   Filed Weights   08/22/20 0228   Weight: 49.3 kg    Examination: General exam: Appears comfortable  HEENT: PERRLA, oral mucosa moist, no sclera icterus or thrush Respiratory system: Clear to auscultation. Respiratory effort normal. Cardiovascular system: S1 & S2 heard, RRR.  Tachycardia Gastrointestinal system: Abdomen soft, non-tender, nondistended. Normal bowel sounds. Central nervous system: Alert and oriented. No focal neurological deficits. Extremities: No cyanosis, clubbing or edema Skin: No rashes or ulcers Psychiatry:  Mood & affect appropriate.     Data Reviewed: I have personally reviewed following labs and imaging studies  CBC: Recent Labs  Lab 08/21/20 1406 08/22/20 0038  WBC 5.7 4.1  NEUTROABS 4.7  --   HGB 12.2 10.3*  HCT 35.6* 30.3*  MCV 94.7 94.7  PLT 191 136*   Basic Metabolic Panel: Recent Labs  Lab 08/21/20 1406 08/22/20 0038  NA 126* 133*  K 3.1* 3.3*  CL 85* 96*  CO2 29 27  GLUCOSE 157* 112*  BUN 21 13  CREATININE 0.93 0.74  CALCIUM 8.3* 7.9*  MG  --  1.9   GFR: Estimated Creatinine Clearance: 53.8 mL/min (by C-G formula based on SCr of 0.74 mg/dL). Liver Function Tests: Recent Labs  Lab 08/21/20 1406 08/22/20 0038  AST 28 26  ALT 23 20  ALKPHOS 66 53  BILITOT 0.7 0.7  PROT 6.4* 5.6*  ALBUMIN 3.0* 2.6*   No results for input(s): LIPASE, AMYLASE in the last 168 hours. No results for input(s): AMMONIA in the last 168 hours. Coagulation Profile: No results for input(s): INR, PROTIME in the last 168 hours. Cardiac Enzymes: No results for input(s): CKTOTAL, CKMB, CKMBINDEX, TROPONINI in the last 168 hours. BNP (last 3 results) No results for input(s): PROBNP in the last 8760 hours. HbA1C: No results for input(s): HGBA1C in the last 72 hours. CBG: No results for input(s): GLUCAP in the last 168 hours. Lipid Profile: No results for input(s): CHOL, HDL, LDLCALC, TRIG, CHOLHDL, LDLDIRECT in the last 72 hours. Thyroid Function Tests: Recent Labs     08/22/20 1606  TSH 0.372   Anemia Panel: No results for input(s): VITAMINB12, FOLATE, FERRITIN, TIBC, IRON, RETICCTPCT in the last 72 hours. Urine analysis:    Component Value Date/Time   COLORURINE YELLOW 08/21/2020 1822   APPEARANCEUR HAZY (A) 08/21/2020 1822   LABSPEC 1.013 08/21/2020 1822   PHURINE 6.0 08/21/2020 1822   GLUCOSEU 150 (A) 08/21/2020 1822  HGBUR SMALL (A) 08/21/2020 1822   BILIRUBINUR NEGATIVE 08/21/2020 1822   KETONESUR 5 (A) 08/21/2020 1822   PROTEINUR 100 (A) 08/21/2020 1822   UROBILINOGEN 1.0 09/20/2013 1621   NITRITE NEGATIVE 08/21/2020 1822   LEUKOCYTESUR NEGATIVE 08/21/2020 1822   Sepsis Labs: @LABRCNTIP (procalcitonin:4,lacticidven:4) ) Recent Results (from the past 240 hour(s))  Resp Panel by RT-PCR (Flu A&B, Covid) Nasopharyngeal Swab     Status: None   Collection Time: 08/21/20  2:07 PM   Specimen: Nasopharyngeal Swab; Nasopharyngeal(NP) swabs in vial transport medium  Result Value Ref Range Status   SARS Coronavirus 2 by RT PCR NEGATIVE NEGATIVE Final    Comment: (NOTE) SARS-CoV-2 target nucleic acids are NOT DETECTED.  The SARS-CoV-2 RNA is generally detectable in upper respiratory specimens during the acute phase of infection. The lowest concentration of SARS-CoV-2 viral copies this assay can detect is 138 copies/mL. A negative result does not preclude SARS-Cov-2 infection and should not be used as the sole basis for treatment or other patient management decisions. A negative result may occur with  improper specimen collection/handling, submission of specimen other than nasopharyngeal swab, presence of viral mutation(s) within the areas targeted by this assay, and inadequate number of viral copies(<138 copies/mL). A negative result must be combined with clinical observations, patient history, and epidemiological information. The expected result is Negative.  Fact Sheet for Patients:  EntrepreneurPulse.com.au  Fact  Sheet for Healthcare Providers:  IncredibleEmployment.be  This test is no t yet approved or cleared by the [place] FDA and  has been authorized for detection and/or diagnosis of SARS-CoV-2 by FDA under an Emergency Use Authorization (EUA). This EUA will remain  in effect (meaning this test can be used) for the duration of the COVID-19 declaration under Section 564(b)(1) of the Act, 21 U.S.C.section 360bbb-3(b)(1), unless the authorization is terminated  or revoked sooner.       Influenza A by PCR NEGATIVE NEGATIVE Final   Influenza B by PCR NEGATIVE NEGATIVE Final    Comment: (NOTE) The Xpert Xpress SARS-CoV-2/FLU/RSV plus assay is intended as an aid in the diagnosis of influenza from Nasopharyngeal swab specimens and should not be used as a sole basis for treatment. Nasal washings and aspirates are unacceptable for Xpert Xpress SARS-CoV-2/FLU/RSV testing.  Fact Sheet for Patients: EntrepreneurPulse.com.au  Fact Sheet for Healthcare Providers: IncredibleEmployment.be  This test is not yet approved or cleared by the [place] FDA and has been authorized for detection and/or diagnosis of SARS-CoV-2 by FDA under an Emergency Use Authorization (EUA). This EUA will remain in effect (meaning this test can be used) for the duration of the COVID-19 declaration under Section 564(b)(1) of the Act, 21 U.S.C. section 360bbb-3(b)(1), unless the authorization is terminated or revoked.  Performed at [facility], [place] [address], [place], [place] [address]   Blood Culture (routine x 2)     Status: None (Preliminary result)   Collection Time: 08/21/20  4:23 PM   Specimen: BLOOD  Result Value Ref Range Status   Specimen Description BLOOD SITE NOT SPECIFIED  Final   Special Requests   Final    BOTTLES DRAWN AEROBIC AND ANAEROBIC Blood Culture adequate volume   Culture   Final    NO GROWTH < 24 HOURS Performed at [facility], [place] [address] [place], [place] [address]    Report Status PENDING  Incomplete  Blood Culture (routine x 2)     Status: None (Preliminary result)   Collection Time: 08/22/20 12:38 AM  Specimen: BLOOD RIGHT HAND  Result Value Ref Range Status   Specimen Description BLOOD RIGHT HAND  Final   Special Requests   Final    BOTTLES DRAWN AEROBIC AND ANAEROBIC Blood Culture adequate volume   Culture   Final    NO GROWTH < 12 HOURS Performed at [facility], [address] [place], [place] [address]    Report Status PENDING  Incomplete  Respiratory Panel by PCR     Status: None   Collection Time: 08/22/20  1:02 PM   Specimen: Nasopharyngeal Swab; Respiratory  Result Value Ref Range Status   Adenovirus NOT DETECTED NOT DETECTED Final   Coronavirus 229E NOT DETECTED NOT DETECTED Final    Comment: (NOTE) The Coronavirus on the Respiratory Panel, DOES NOT test for the novel  Coronavirus (2019 nCoV)    Coronavirus HKU1 NOT DETECTED NOT DETECTED Final   Coronavirus NL63 NOT DETECTED NOT DETECTED Final   Coronavirus OC43 NOT DETECTED NOT DETECTED Final   Metapneumovirus NOT DETECTED NOT DETECTED Final   Rhinovirus / Enterovirus NOT DETECTED NOT DETECTED Final   Influenza A NOT DETECTED NOT DETECTED Final   Influenza B NOT DETECTED NOT DETECTED Final   Parainfluenza Virus 1 NOT DETECTED NOT DETECTED Final   Parainfluenza Virus 2 NOT DETECTED NOT DETECTED Final   Parainfluenza Virus 3 NOT DETECTED NOT DETECTED Final   Parainfluenza Virus 4 NOT DETECTED NOT DETECTED Final   Respiratory Syncytial Virus NOT DETECTED NOT DETECTED Final   Bordetella pertussis NOT DETECTED NOT DETECTED Final   Bordetella Parapertussis NOT DETECTED NOT DETECTED Final   Chlamydophila pneumoniae NOT DETECTED NOT DETECTED Final   Mycoplasma pneumoniae NOT DETECTED NOT DETECTED Final    Comment: Performed at [facility], [place]. [address] [place], [place] [address]         Radiology  Studies: DG Chest 2 View  Result Date: 08/21/2020 CLINICAL DATA:  Cough EXAM: CHEST - 2 VIEW COMPARISON:  CT 06/28/2020.  Radiography 04/18/2019. FINDINGS: Heart size is normal. Chronic aortic atherosclerosis. Chronic pulmonary emphysema with hyperinflation and interstitial scarring. No sign of focal infiltrate, mass, effusion or collapse. IMPRESSION: Emphysema. No active process evident. Aortic atherosclerosis. Electronically Signed   By: Nelson Chimes M.D.   On: 08/21/2020 14:31   CT ANGIO CHEST PE W OR WO CONTRAST  Result Date: 08/22/2020 CLINICAL DATA:  64 year old female with concern for pulmonary embolism. EXAM: CT ANGIOGRAPHY CHEST WITH CONTRAST TECHNIQUE: Multidetector CT imaging of the chest was performed using the standard protocol during bolus administration of intravenous contrast. Multiplanar CT image reconstructions and MIPs were obtained to evaluate the vascular anatomy. CONTRAST:  67mL OMNIPAQUE IOHEXOL 350 MG/ML SOLN COMPARISON:  Chest CT dated 06/28/2020. FINDINGS: Cardiovascular: There is no cardiomegaly or pericardial effusion. There is coronary vascular calcification. Moderate atherosclerotic calcification of the thoracic aorta. No aneurysmal dilatation or dissection. The origins of the great vessels of the aortic arch appear patent as visualized. Evaluation of the pulmonary arteries is somewhat limited due to respiratory motion artifact. No pulmonary artery embolus identified. Mediastinum/Nodes: There is no hilar or mediastinal adenopathy. The esophagus is grossly unremarkable. No mediastinal fluid collection. Lungs/Pleura: Severe emphysema. There are bibasilar linear atelectasis/scarring. There is a 9 mm nodule in the left upper lobe and a 12 mm nodule along the minor fissure similar to prior CT. No new consolidation. There is no pleural effusion pneumothorax. The central airways are patent. Upper Abdomen: No acute abnormality. Musculoskeletal: Osteopenia with degenerative changes of  the spine. No acute osseous pathology. Review of the MIP images confirms the above findings. IMPRESSION: 1. No acute intrathoracic pathology. No CT evidence of pulmonary embolism. 2. Bilateral pulmonary nodules as seen on the prior CT. Given background of severe emphysema consider annual low dose CT cancer screening follow-up as per recommendation of prior CT. 3. Aortic Atherosclerosis (ICD10-I70.0) and Emphysema (ICD10-J43.9). Electronically Signed   By: Anner Crete M.D.   On: 08/22/2020 15:30      Scheduled Meds: . arformoterol  15 mcg Nebulization BID  . aspirin EC  81 mg Oral Daily  . budesonide (PULMICORT) nebulizer solution  0.25 mg Nebulization BID  . enoxaparin (LOVENOX) injection  40 mg Subcutaneous Q24H  . hydroxyurea  500 mg Oral QODAY  . levothyroxine  88 mcg Oral QAC breakfast  . multivitamin with minerals  1 tablet Oral Daily  . revefenacin  175 mcg Nebulization Daily  . rosuvastatin  40 mg Oral Daily   Continuous Infusions: . sodium chloride 100 mL/hr at 08/22/20 1330     LOS: 1 day      Debbe Odea, MD [facility] Pager: [email or web address] 08/22/2020, 6:31 PM

## 2020-08-22 NOTE — Plan of Care (Signed)
°  Problem: Coping: °Goal: Level of anxiety will decrease °Outcome: Progressing °  °

## 2020-08-22 NOTE — Plan of Care (Signed)
  Problem: Education: ?Goal: Knowledge of General Education information will improve ?Description: Including pain rating scale, medication(s)/side effects and non-pharmacologic comfort measures ?Outcome: Progressing ?  ?Problem: Clinical Measurements: ?Goal: Ability to maintain clinical measurements within normal limits will improve ?Outcome: Progressing ?Goal: Respiratory complications will improve ?Outcome: Progressing ?  ?Problem: Nutrition: ?Goal: Adequate nutrition will be maintained ?Outcome: Progressing ?  ?

## 2020-08-22 NOTE — Progress Notes (Addendum)
Pharmacy Antibiotic Note  Cheryl Haley is a 64 y.o. female admitted on 08/21/2020 with an infection of unknown source.    Flu result was from 6 yrs ago. Will d/w Dr. Wynelle Cleveland about selection of abx again.   Plan: F/u with abx plan  Weight: 49.3 kg (108 lb 9.6 oz)  Temp (24hrs), Avg:99.4 F (37.4 C), Min:97.6 F (36.4 C), Max:101.6 F (38.7 C)  Recent Labs  Lab 08/21/20 1406 08/22/20 0038  WBC 5.7 4.1  CREATININE 0.93 0.74  LATICACIDVEN 1.1 1.1    Estimated Creatinine Clearance: 53.8 mL/min (by C-G formula based on SCr of 0.74 mg/dL).    No Known Allergies  Antimicrobials this admission: Cefepime 12/21 >> 12/22 Vanc 12/21 >> 12/22  Dose adjustments this admission:  Microbiology results: 12/21 BCx: ngtd  Onnie Boer, PharmD, BCIDP, AAHIVP, CPP Infectious Disease Pharmacist 08/22/2020 11:46 AM

## 2020-08-22 NOTE — Consult Note (Addendum)
NAME:  Cheryl Haley, MRN:  [id number], DOB:  July 26, 1956, LOS: 1 ADMISSION DATE:  08/21/2020, CONSULTATION DATE:  08/22/2020 REFERRING MD:  Dr. Wynelle Cleveland, [facility], CHIEF COMPLAINT:  Short of breath   Brief History:  64 yo female former smoker presented with progressive dyspnea and weakness.  She had recent hospitalization at [facility] for COPD exacerbation.  She had persistent dyspnea and fever (temp 101).  She is followed in pulmonary office by [name] and to establish with [name].  History of Present Illness:  She is followed in pulmonary office for advanced COPD with emphysema and chronic hypoxic respiratory failure on 2 liters oxygen. She developed fever, decreased appetite, fatigue and weakness earlier this month.  She was admitted to [facility] and treated for COPD exacerbation.  Several days after discharge she developed recurrence of her symptoms.  She also thinks she has lost about 10 lbs.  She has felt her heart rate going fast.   She denies sinus congestion, sore throat, wheeze, sputum production, hemoptysis, skin rash, joint swelling, GI symptoms, or leg swelling.  She quit smoking years ago.  No recent travel history or sick exposures.  No animal/bird exposures.  Past Medical History:  HTN, PNA, CVA, HLD, COPD with emphysema, Chronic hypoxic respiratory failure, Hypothyroidism, Hiatal hernia, Essential thrombocythemia, OA, Anxiety, Influenza A January 2015  Significant Hospital Events:  12/21 Admit  Consults:    Procedures:    Significant Diagnostic Tests:   Spirometry 12/26/13 >> FEV1 0.8 (33%), FEV1% 41  CT angio chest 08/22/20 >>   Micro Data:  COVID/flu 12/21 >> negative Blood 12/21 >> Blood 12/22 >> RVP 12/22 >>   Antimicrobials:  Vancomycin 12/21 Flagyl 12/21  Cefepime 12/21 >>   Interim History / Subjective:    Objective   BP 130/61 (BP Location: Right Arm)   Pulse 98   Temp (!) 100.5 F (38.1 C) (Oral)   Resp 20   Wt 49.3 kg   SpO2  97%   BMI 18.64 kg/m         Intake/Output Summary (Last 24 hours) at 08/22/2020 1303 Last data filed at 08/22/2020 1000 Gross per 24 hour  Intake 1824.94 ml  Output 825 ml  Net 999.94 ml   Filed Weights   08/22/20 0228  Weight: 49.3 kg    Examination:  General - alert, thin Eyes - pupils reactive ENT - no sinus tenderness, no stridor Cardiac - regular, tachycardic, no murmur Chest - barrel chest, decreased BS b/l, faint b/l expiratory wheeze Abdomen - soft, non tender, + bowel sounds Extremities - no cyanosis, clubbing, or edema Skin - no rashes Neuro - normal strength, moves extremities, follows commands Psych - normal mood and behavior Lymphatics - no lymphadenopathy  Discussion:  64 yo female with recurrent fever, dyspnea, fatigue, and myalgia.  She was also found to have hypovolemia, hyponatremia, and tachycardia.  Reports recent weight loss.    Assessment & Plan:   Fever. - continue ABx - check respiratory viral panel - f/u culture results  Severe COPD with emphysema. - change to yupelri, brovana, pulmicort while in hospital - prn albuterol - defer additional steroids for now - will need to have A1AT level check at some point >> can be done as outpt  Acute on chronic hypoxic respiratory failure. - adjust oxygen to keep SpO2 > 90%  Essential thrombocythemia. - JAK2 [id number] - followed at [facility] - continue hydoxyurea and aspirin  Hypovolemic hyponatremia. - improved with IV fluid -  f/u BMET - check TSH, cortisol  Pulmonary cachexia with BMI 18.64. - would likely benefit from nutrition consult at some point during this hospitalization  Best practice:  Diet: heart healthy DVT prophylaxis: lovenox GI prophylaxis: not indicated Mobility: as tolerated  Labs    CMP Latest Ref Rng & Units 08/22/2020 08/21/2020 07/12/2020  Glucose 70 - 99 mg/dL 112(H) 157(H) -  BUN 8 - 23 mg/dL 13 21 -  Creatinine 0.44 - 1.00 mg/dL 0.74 0.93 -   Sodium 135 - 145 mmol/L 133(L) 126(L) -  Potassium 3.5 - 5.1 mmol/L 3.3(L) 3.1(L) -  Chloride 98 - 111 mmol/L 96(L) 85(L) -  CO2 22 - 32 mmol/L 27 29 -  Calcium 8.9 - 10.3 mg/dL 7.9(L) 8.3(L) -  Total Protein 6.5 - 8.1 g/dL 5.6(L) 6.4(L) -  Total Bilirubin 0.3 - 1.2 mg/dL 0.7 0.7 -  Alkaline Phos 38 - 126 U/L 53 66 -  AST 15 - 41 U/L 26 28 -  ALT 0 - 44 U/L 20 23 25     CBC Latest Ref Rng & Units 08/22/2020 08/21/2020 05/04/2020  WBC 4.0 - 10.5 K/uL 4.1 5.7 4.8  Hemoglobin 12.0 - 15.0 g/dL 10.3(L) 12.2 11.1(L)  Hematocrit 36.0 - 46.0 % 30.3(L) 35.6(L) 33.5(L)  Platelets 150 - 400 K/uL 136(L) 191 269    ABG    Component Value Date/Time   HCO3 32.2 (H) 09/15/2018 1950   O2SAT 80.2 09/15/2018 1950    Lab Results  Component Value Date   TSH 1.547 09/16/2018    Review of Systems:   Reviewed and negative  Past Medical History:  She,  has a past medical history of Anxiety, Arthritis, COPD (chronic obstructive pulmonary disease) ([place]), Emphysema lung ([place]), Essential thrombocythemia ([place]) (09/15/2019), History of hiatal hernia, TIA (transient ischemic attack) and stroke, Hyperlipidemia, Hypertension, Hypothyroidism, Impaired fasting glucose, Pleural effusion (05/15/2014), Pneumonia, Protein-calorie malnutrition, severe (09/17/2018), Pulmonary nodules (07/14/2018), Thyroid disease, Tobacco abuse (11/28/2015), Unspecified hypothyroidism (09/20/2013), and Viral respiratory infection (09/16/2018).   Surgical History:   Past Surgical History:  Procedure Laterality Date  . ESOPHAGOGASTRODUODENOSCOPY    . VIDEO BRONCHOSCOPY WITH ENDOBRONCHIAL NAVIGATION Left 07/29/2017   Procedure: VIDEO BRONCHOSCOPY WITH ENDOBRONCHIAL NAVIGATION;  Surgeon: Collene Gobble, MD;  Location: [place];  Service: Thoracic;  Laterality: Left;     Social History:   reports that she quit smoking about 3 years ago. Her smoking use included cigarettes. She has a 80.00 pack-year smoking history. She has never used  smokeless tobacco. She reports current alcohol use of about 14.0 standard drinks of alcohol per week. She reports that she does not use drugs.   Family History:  Her family history includes Cancer in her sister and sister; Congestive Heart Failure in her sister and sister; Emphysema in her brother, father, sister, and sister; Heart disease in her brother, brother, father, mother, and sister.   Allergies No Known Allergies   Home Medications  Prior to Admission medications   Medication Sig Start Date End Date Taking? Authorizing Provider  albuterol (PROVENTIL) (2.5 MG/3ML) 0.083% nebulizer solution Take 2.5 mg by nebulization every 6 (six) hours as needed for wheezing or shortness of breath. 08/07/20  Yes [provider]  aspirin EC 81 MG tablet Take 81 mg daily by mouth.    Yes [provider]  diltiazem (CARDIZEM) 60 MG tablet Take 1 tablet (60 mg total) by mouth 2 (two) times daily. 09/19/18  Yes Bonnell Public, MD  EUTHYROX 88 MCG tablet Take 88 mcg  by mouth daily before breakfast. 11/04/19  Yes [provider]  hydroxyurea (HYDREA) 500 MG capsule Take 500 mg by mouth every other day. 07/30/20  Yes [provider]  losartan (COZAAR) 50 MG tablet Take 50 mg by mouth daily. 12/29/18  Yes [provider]  metoprolol succinate (TOPROL-XL) 25 MG 24 hr tablet Take 25 mg by mouth daily. 11/10/18  Yes [provider]  mometasone-formoterol (DULERA) 100-5 MCG/ACT AERO Inhale 2 puffs into the lungs 2 (two) times daily. 12/06/19  Yes Julian Hy, DO  Multiple Vitamin (MULTIVITAMIN) tablet Take 1 tablet by mouth daily.   Yes [provider]  OXYGEN Inhale 2 L into the lungs continuous.   Yes [provider]  rosuvastatin (CRESTOR) 40 MG tablet Take 1 tablet (40 mg total) by mouth daily. 07/13/20  Yes Turner, Eber Hong, MD  tiotropium (SPIRIVA HANDIHALER) 18 MCG inhalation capsule Place 1 capsule (18 mcg total) into inhaler and inhale  daily. 06/06/20  Yes Parrett, Fonnie Mu, NP     Signature:  Chesley Mires, MD [facility] Pager - [phone] 08/22/2020, 1:59 PM

## 2020-08-23 ENCOUNTER — Ambulatory Visit: Payer: Medicare Other | Admitting: Hematology & Oncology

## 2020-08-23 ENCOUNTER — Telehealth: Payer: Self-pay | Admitting: Pulmonary Disease

## 2020-08-23 ENCOUNTER — Other Ambulatory Visit: Payer: Medicare Other

## 2020-08-23 DIAGNOSIS — J9621 Acute and chronic respiratory failure with hypoxia: Secondary | ICD-10-CM | POA: Diagnosis not present

## 2020-08-23 DIAGNOSIS — A419 Sepsis, unspecified organism: Secondary | ICD-10-CM

## 2020-08-23 DIAGNOSIS — J9622 Acute and chronic respiratory failure with hypercapnia: Secondary | ICD-10-CM | POA: Diagnosis not present

## 2020-08-23 DIAGNOSIS — J441 Chronic obstructive pulmonary disease with (acute) exacerbation: Secondary | ICD-10-CM | POA: Diagnosis not present

## 2020-08-23 LAB — STREP PNEUMONIAE URINARY ANTIGEN: Strep Pneumo Urinary Antigen: NEGATIVE

## 2020-08-23 MED ORDER — MAGNESIUM SULFATE 4 GM/100ML IV SOLN
4.0000 g | Freq: Once | INTRAVENOUS | Status: AC
  Administered 2020-08-23: 4 g via INTRAVENOUS
  Filled 2020-08-23: qty 100

## 2020-08-23 MED ORDER — ENSURE ENLIVE PO LIQD
237.0000 mL | Freq: Two times a day (BID) | ORAL | Status: DC
  Administered 2020-08-23 – 2020-08-27 (×9): 237 mL via ORAL

## 2020-08-23 MED ORDER — METHYLPREDNISOLONE SODIUM SUCC 125 MG IJ SOLR
60.0000 mg | Freq: Four times a day (QID) | INTRAMUSCULAR | Status: DC
  Administered 2020-08-23 – 2020-08-24 (×4): 60 mg via INTRAVENOUS
  Filled 2020-08-23 (×4): qty 2

## 2020-08-23 MED ORDER — BUDESONIDE 0.5 MG/2ML IN SUSP
0.5000 mg | Freq: Two times a day (BID) | RESPIRATORY_TRACT | Status: DC
  Administered 2020-08-23 – 2020-08-28 (×10): 0.5 mg via RESPIRATORY_TRACT
  Filled 2020-08-23 (×10): qty 2

## 2020-08-23 MED ORDER — POTASSIUM CHLORIDE CRYS ER 20 MEQ PO TBCR
40.0000 meq | EXTENDED_RELEASE_TABLET | Freq: Once | ORAL | Status: AC
  Administered 2020-08-23: 40 meq via ORAL
  Filled 2020-08-23: qty 2

## 2020-08-23 NOTE — Progress Notes (Signed)
PROGRESS NOTE    Cheryl Haley   PQA:[id number]  DOB: 10/15/1955  DOA: 08/21/2020 PCP: Catha Gosselin, MD   Brief Narrative:  Cheryl Haley is a 64 y.o. year-old w/ hx of tobacco abuse, PNA, HTN, HL, hx CVA/ TIA, COPD/ emphysema brought to ED by EMS for eval of gen'd weakness, SOB and cough.  Seen at outside hospital recently and told may have COPD exacerbation, she rec'd Rx for prednisone for 10 days. She took the medication and was feeling a little better , but then again felt worsening dyspnea. She presented to the ED and was found to have a temp of 101.6. Given Vanc and Cefepime and admitted to the hospital.    Subjective: She states she feels the same as yesterday but does admit that her heart is no longer racing. Appetite is poor and dyspnea is the same. No cough.     Assessment & Plan:   Principal Problem: Acute on chronic respiratory failure with fever /   COPD (chronic obstructive pulmonary disease) with emphysema   Sepsis due to bacteremia? - CT of the chest ordered to look for pneumonia and is unrevealing for an infection- No PE found either > blood culture 1/4 reveals Strep sanguinous started on Ceftriaxone on 12/22 - Temp 100.5 on 12/22 and has not recurred since - appreciate pulmonary eval- cont chronic meds for COPD   Active Problems:  Sinus tachycardia -home Cardizem and Toprol were on hold for low BPs- Cardizem resumed yesterday  Hypovolemia - given IVF- follow oral intake today  Cachexia (? Related to COPD) - add dietary supplements- nutrition consulted today  Hypokalemia - replaced - follow     Hypothyroidism -cont Synthroid    Tobacco abuse       Essential thrombocythemia - cont Hydroxyurea     Time spent in minutes: 35 DVT prophylaxis: enoxaparin (LOVENOX) injection 40 mg Start: 08/21/20 2200  Code Status: Full code Family Communication:  Disposition Plan:  Status is: Inpatient  Remains inpatient appropriate because:Ongoing  diagnostic testing needed not appropriate for outpatient work up   Dispo: The patient is from: Home              Anticipated d/c is to: Home              Anticipated d/c date is: 1 day              Patient currently is not medically stable to d/c.   Consultants:   pulmonary Procedures:    Antimicrobials:  Anti-infectives (From admission, onward)   Start     Dose/Rate Route Frequency Ordered Stop   08/22/20 2300  cefTRIAXone (ROCEPHIN) 2 g in sodium chloride 0.9 % 100 mL IVPB        2 g 200 mL/hr over 30 Minutes Intravenous Every 24 hours 08/22/20 2248     08/22/20 1230  oseltamivir (TAMIFLU) capsule 30 mg  Status:  Discontinued        30 mg Oral 2 times daily 08/22/20 1142 08/22/20 1200   08/22/20 0500  vancomycin (VANCOREADY) IVPB 500 mg/100 mL  Status:  Discontinued        500 mg 100 mL/hr over 60 Minutes Intravenous Every 12 hours 08/21/20 1659 08/22/20 1142   08/22/20 0000  ceFEPIme (MAXIPIME) 2 g in sodium chloride 0.9 % 100 mL IVPB  Status:  Discontinued        2 g 200 mL/hr over 30 Minutes Intravenous Every 8 hours 08/21/20 1659 08/22/20 1142  08/21/20 1600  ceFEPIme (MAXIPIME) 2 g in sodium chloride 0.9 % 100 mL IVPB        2 g 200 mL/hr over 30 Minutes Intravenous  Once 08/21/20 1557 08/21/20 1659   08/21/20 1600  vancomycin (VANCOCIN) IVPB 1000 mg/200 mL premix        1,000 mg 200 mL/hr over 60 Minutes Intravenous  Once 08/21/20 1557 08/21/20 1811   08/21/20 1600  metroNIDAZOLE (FLAGYL) tablet 500 mg  Status:  Discontinued        500 mg Oral Every 8 hours 08/21/20 1557 08/22/20 1150       Objective: Vitals:   08/23/20 0542 08/23/20 0757 08/23/20 0820 08/23/20 1153  BP: (!) 142/64  (!) 154/66 139/62  Pulse: 92  98 (!) 107  Resp:   19 18  Temp:   98.3 F (36.8 C) 98.2 F (36.8 C)  TempSrc:   Oral Oral  SpO2: 98% 98% 99% 96%  Weight:        Intake/Output Summary (Last 24 hours) at 08/23/2020 1324 Last data filed at 08/23/2020 1159 Gross per 24 hour   Intake 580 ml  Output 2200 ml  Net -1620 ml   Filed Weights   08/22/20 0228 08/23/20 0259  Weight: 49.3 kg 50 kg    Examination: General exam: Appears comfortable  HEENT: PERRLA, oral mucosa moist, no sclera icterus or thrush Respiratory system: Clear to auscultation. Respiratory effort normal. Cardiovascular system: S1 & S2 heard,  No murmurs  Gastrointestinal system: Abdomen soft, non-tender, nondistended. Normal bowel sounds   Central nervous system: Alert and oriented. No focal neurological deficits. Extremities: No cyanosis, clubbing or edema Skin: No rashes or ulcers Psychiatry:  Mood & affect appropriate.    Data Reviewed: I have personally reviewed following labs and imaging studies  CBC: Recent Labs  Lab 08/21/20 1406 08/22/20 0038  WBC 5.7 4.1  NEUTROABS 4.7  --   HGB 12.2 10.3*  HCT 35.6* 30.3*  MCV 94.7 94.7  PLT 191 [id number]*   Basic Metabolic Panel: Recent Labs  Lab 08/21/20 1406 08/22/20 0038  NA 126* 133*  K 3.1* 3.3*  CL 85* 96*  CO2 29 27  GLUCOSE 157* 112*  BUN 21 13  CREATININE 0.93 0.74  CALCIUM 8.3* 7.9*  MG  --  1.9   GFR: Estimated Creatinine Clearance: 54.6 mL/min (by C-G formula based on SCr of 0.74 mg/dL). Liver Function Tests: Recent Labs  Lab 08/21/20 1406 08/22/20 0038  AST 28 26  ALT 23 20  ALKPHOS 66 53  BILITOT 0.7 0.7  PROT 6.4* 5.6*  ALBUMIN 3.0* 2.6*   No results for input(s): LIPASE, AMYLASE in the last 168 hours. No results for input(s): AMMONIA in the last 168 hours. Coagulation Profile: No results for input(s): INR, PROTIME in the last 168 hours. Cardiac Enzymes: No results for input(s): CKTOTAL, CKMB, CKMBINDEX, TROPONINI in the last 168 hours. BNP (last 3 results) No results for input(s): PROBNP in the last 8760 hours. HbA1C: No results for input(s): HGBA1C in the last 72 hours. CBG: No results for input(s): GLUCAP in the last 168 hours. Lipid Profile: No results for input(s): CHOL, HDL, LDLCALC, TRIG,  CHOLHDL, LDLDIRECT in the last 72 hours. Thyroid Function Tests: Recent Labs    08/22/20 1606  TSH 0.372   Anemia Panel: No results for input(s): VITAMINB12, FOLATE, FERRITIN, TIBC, IRON, RETICCTPCT in the last 72 hours. Urine analysis:    Component Value Date/Time   COLORURINE YELLOW 08/21/2020 1822  APPEARANCEUR HAZY (A) 08/21/2020 1822   LABSPEC 1.013 08/21/2020 1822   PHURINE 6.0 08/21/2020 1822   GLUCOSEU 150 (A) 08/21/2020 1822   HGBUR SMALL (A) 08/21/2020 1822   BILIRUBINUR NEGATIVE 08/21/2020 1822   KETONESUR 5 (A) 08/21/2020 1822   PROTEINUR 100 (A) 08/21/2020 1822   UROBILINOGEN 1.0 09/20/2013 1621   NITRITE NEGATIVE 08/21/2020 1822   LEUKOCYTESUR NEGATIVE 08/21/2020 1822   Sepsis Labs: @LABRCNTIP (procalcitonin:4,lacticidven:4) ) Recent Results (from the past 240 hour(s))  Resp Panel by RT-PCR (Flu A&B, Covid) Nasopharyngeal Swab     Status: None   Collection Time: 08/21/20  2:07 PM   Specimen: Nasopharyngeal Swab; Nasopharyngeal(NP) swabs in vial transport medium  Result Value Ref Range Status   SARS Coronavirus 2 by RT PCR NEGATIVE NEGATIVE Final    Comment: (NOTE) SARS-CoV-2 target nucleic acids are NOT DETECTED.  The SARS-CoV-2 RNA is generally detectable in upper respiratory specimens during the acute phase of infection. The lowest concentration of SARS-CoV-2 viral copies this assay can detect is 138 copies/mL. A negative result does not preclude SARS-Cov-2 infection and should not be used as the sole basis for treatment or other patient management decisions. A negative result may occur with  improper specimen collection/handling, submission of specimen other than nasopharyngeal swab, presence of viral mutation(s) within the areas targeted by this assay, and inadequate number of viral copies(<138 copies/mL). A negative result must be combined with clinical observations, patient history, and epidemiological information. The expected result is  Negative.  Fact Sheet for Patients:  EntrepreneurPulse.com.au  Fact Sheet for Healthcare Providers:  IncredibleEmployment.be  This test is no t yet approved or cleared by the [place] FDA and  has been authorized for detection and/or diagnosis of SARS-CoV-2 by FDA under an Emergency Use Authorization (EUA). This EUA will remain  in effect (meaning this test can be used) for the duration of the COVID-19 declaration under Section 564(b)(1) of the Act, 21 U.S.C.section 360bbb-3(b)(1), unless the authorization is terminated  or revoked sooner.       Influenza A by PCR NEGATIVE NEGATIVE Final   Influenza B by PCR NEGATIVE NEGATIVE Final    Comment: (NOTE) The Xpert Xpress SARS-CoV-2/FLU/RSV plus assay is intended as an aid in the diagnosis of influenza from Nasopharyngeal swab specimens and should not be used as a sole basis for treatment. Nasal washings and aspirates are unacceptable for Xpert Xpress SARS-CoV-2/FLU/RSV testing.  Fact Sheet for Patients: EntrepreneurPulse.com.au  Fact Sheet for Healthcare Providers: IncredibleEmployment.be  This test is not yet approved or cleared by the [place] FDA and has been authorized for detection and/or diagnosis of SARS-CoV-2 by FDA under an Emergency Use Authorization (EUA). This EUA will remain in effect (meaning this test can be used) for the duration of the COVID-19 declaration under Section 564(b)(1) of the Act, 21 U.S.C. section 360bbb-3(b)(1), unless the authorization is terminated or revoked.  Performed at [facility], [place] [address], [place], [place] [address]   Blood Culture (routine x 2)     Status: Abnormal   Collection Time: 08/21/20  4:23 PM   Specimen: BLOOD  Result Value Ref Range Status   Specimen Description BLOOD SITE NOT SPECIFIED  Final   Special Requests   Final    BOTTLES DRAWN AEROBIC AND ANAEROBIC Blood Culture adequate  volume   Culture  Setup Time   Final    AEROBIC BOTTLE ONLY GRAM POSITIVE COCCI Organism ID to follow CRITICAL RESULT CALLED TO, READ BACK BY AND VERIFIED WITH:  T DANG PHARMD 08/22/20 2236 JDW    Culture (A)  Final    STREPTOCOCCUS SANGUINIS THE SIGNIFICANCE OF ISOLATING THIS ORGANISM FROM A SINGLE SET OF BLOOD CULTURES WHEN MULTIPLE SETS ARE DRAWN IS UNCERTAIN. PLEASE NOTIFY THE MICROBIOLOGY DEPARTMENT WITHIN ONE WEEK IF SPECIATION AND SENSITIVITIES ARE REQUIRED. Performed at [facility], [place] [address] [place], [place] [address]    Report Status 08/23/2020 FINAL  Final  Blood Culture ID Panel (Reflexed)     Status: Abnormal   Collection Time: 08/21/20  4:23 PM  Result Value Ref Range Status   Enterococcus faecalis NOT DETECTED NOT DETECTED Final   Enterococcus Faecium NOT DETECTED NOT DETECTED Final   Listeria monocytogenes NOT DETECTED NOT DETECTED Final   Staphylococcus species NOT DETECTED NOT DETECTED Final   Staphylococcus aureus (BCID) NOT DETECTED NOT DETECTED Final   Staphylococcus epidermidis NOT DETECTED NOT DETECTED Final   Staphylococcus lugdunensis NOT DETECTED NOT DETECTED Final   Streptococcus species DETECTED (A) NOT DETECTED Final    Comment: Not Enterococcus species, Streptococcus agalactiae, Streptococcus pyogenes, or Streptococcus pneumoniae. CRITICAL RESULT CALLED TO, READ BACK BY AND VERIFIED WITH: T DANG PHARMD 08/22/20 2236 JDW    Streptococcus agalactiae NOT DETECTED NOT DETECTED Final   Streptococcus pneumoniae NOT DETECTED NOT DETECTED Final   Streptococcus pyogenes NOT DETECTED NOT DETECTED Final   A.calcoaceticus-baumannii NOT DETECTED NOT DETECTED Final   Bacteroides fragilis NOT DETECTED NOT DETECTED Final   Enterobacterales NOT DETECTED NOT DETECTED Final   Enterobacter cloacae complex NOT DETECTED NOT DETECTED Final   Escherichia coli NOT DETECTED NOT DETECTED Final   Klebsiella aerogenes NOT DETECTED NOT DETECTED Final   Klebsiella  oxytoca NOT DETECTED NOT DETECTED Final   Klebsiella pneumoniae NOT DETECTED NOT DETECTED Final   Proteus species NOT DETECTED NOT DETECTED Final   Salmonella species NOT DETECTED NOT DETECTED Final   Serratia marcescens NOT DETECTED NOT DETECTED Final   Haemophilus influenzae NOT DETECTED NOT DETECTED Final   Neisseria meningitidis NOT DETECTED NOT DETECTED Final   Pseudomonas aeruginosa NOT DETECTED NOT DETECTED Final   Stenotrophomonas maltophilia NOT DETECTED NOT DETECTED Final   Candida albicans NOT DETECTED NOT DETECTED Final   Candida auris NOT DETECTED NOT DETECTED Final   Candida glabrata NOT DETECTED NOT DETECTED Final   Candida krusei NOT DETECTED NOT DETECTED Final   Candida parapsilosis NOT DETECTED NOT DETECTED Final   Candida tropicalis NOT DETECTED NOT DETECTED Final   Cryptococcus neoformans/gattii NOT DETECTED NOT DETECTED Final    Comment: Performed at [facility], [address], [place], [place] [address]  Blood Culture (routine x 2)     Status: None (Preliminary result)   Collection Time: 08/22/20 12:38 AM   Specimen: BLOOD RIGHT HAND  Result Value Ref Range Status   Specimen Description BLOOD RIGHT HAND  Final   Special Requests   Final    BOTTLES DRAWN AEROBIC AND ANAEROBIC Blood Culture adequate volume   Culture   Final    NO GROWTH 1 DAY Performed at [facility], [place] [address], [place], [place] [address]    Report Status PENDING  Incomplete  Respiratory Panel by PCR     Status: None   Collection Time: 08/22/20  1:02 PM   Specimen: Nasopharyngeal Swab; Respiratory  Result Value Ref Range Status   Adenovirus NOT DETECTED NOT DETECTED Final   Coronavirus 229E NOT DETECTED NOT DETECTED Final    Comment: (NOTE) The Coronavirus on the Respiratory Panel,  DOES NOT test for the novel  Coronavirus (2019 nCoV)    Coronavirus HKU1 NOT DETECTED NOT DETECTED Final   Coronavirus NL63 NOT DETECTED NOT DETECTED Final   Coronavirus OC43 NOT DETECTED NOT  DETECTED Final   Metapneumovirus NOT DETECTED NOT DETECTED Final   Rhinovirus / Enterovirus NOT DETECTED NOT DETECTED Final   Influenza A NOT DETECTED NOT DETECTED Final   Influenza B NOT DETECTED NOT DETECTED Final   Parainfluenza Virus 1 NOT DETECTED NOT DETECTED Final   Parainfluenza Virus 2 NOT DETECTED NOT DETECTED Final   Parainfluenza Virus 3 NOT DETECTED NOT DETECTED Final   Parainfluenza Virus 4 NOT DETECTED NOT DETECTED Final   Respiratory Syncytial Virus NOT DETECTED NOT DETECTED Final   Bordetella pertussis NOT DETECTED NOT DETECTED Final   Bordetella Parapertussis NOT DETECTED NOT DETECTED Final   Chlamydophila pneumoniae NOT DETECTED NOT DETECTED Final   Mycoplasma pneumoniae NOT DETECTED NOT DETECTED Final    Comment: Performed at [facility], [place] [address], [place], [place] [address]         Radiology Studies: DG Chest 2 View  Result Date: 08/21/2020 CLINICAL DATA:  Cough EXAM: CHEST - 2 VIEW COMPARISON:  CT 06/28/2020.  Radiography 04/18/2019. FINDINGS: Heart size is normal. Chronic aortic atherosclerosis. Chronic pulmonary emphysema with hyperinflation and interstitial scarring. No sign of focal infiltrate, mass, effusion or collapse. IMPRESSION: Emphysema. No active process evident. Aortic atherosclerosis. Electronically Signed   By: Nelson Chimes M.D.   On: 08/21/2020 14:31   CT ANGIO CHEST PE W OR WO CONTRAST  Result Date: 08/22/2020 CLINICAL DATA:  64 year old female with concern for pulmonary embolism. EXAM: CT ANGIOGRAPHY CHEST WITH CONTRAST TECHNIQUE: Multidetector CT imaging of the chest was performed using the standard protocol during bolus administration of intravenous contrast. Multiplanar CT image reconstructions and MIPs were obtained to evaluate the vascular anatomy. CONTRAST:  57mL OMNIPAQUE IOHEXOL 350 MG/ML SOLN COMPARISON:  Chest CT dated 06/28/2020. FINDINGS: Cardiovascular: There is no cardiomegaly or pericardial effusion. There is coronary  vascular calcification. Moderate atherosclerotic calcification of the thoracic aorta. No aneurysmal dilatation or dissection. The origins of the great vessels of the aortic arch appear patent as visualized. Evaluation of the pulmonary arteries is somewhat limited due to respiratory motion artifact. No pulmonary artery embolus identified. Mediastinum/Nodes: There is no hilar or mediastinal adenopathy. The esophagus is grossly unremarkable. No mediastinal fluid collection. Lungs/Pleura: Severe emphysema. There are bibasilar linear atelectasis/scarring. There is a 9 mm nodule in the left upper lobe and a 12 mm nodule along the minor fissure similar to prior CT. No new consolidation. There is no pleural effusion pneumothorax. The central airways are patent. Upper Abdomen: No acute abnormality. Musculoskeletal: Osteopenia with degenerative changes of the spine. No acute osseous pathology. Review of the MIP images confirms the above findings. IMPRESSION: 1. No acute intrathoracic pathology. No CT evidence of pulmonary embolism. 2. Bilateral pulmonary nodules as seen on the prior CT. Given background of severe emphysema consider annual low dose CT cancer screening follow-up as per recommendation of prior CT. 3. Aortic Atherosclerosis (ICD10-I70.0) and Emphysema (ICD10-J43.9). Electronically Signed   By: Anner Crete M.D.   On: 08/22/2020 15:30      Scheduled Meds: . arformoterol  15 mcg Nebulization BID  . aspirin EC  81 mg Oral Daily  . budesonide (PULMICORT) nebulizer solution  0.5 mg Nebulization BID  . diltiazem  60 mg Oral BID  . enoxaparin (LOVENOX) injection  40 mg Subcutaneous Q24H  . feeding  supplement  237 mL Oral BID BM  . hydroxyurea  500 mg Oral QODAY  . levothyroxine  88 mcg Oral QAC breakfast  . multivitamin with minerals  1 tablet Oral Daily  . revefenacin  175 mcg Nebulization Daily  . rosuvastatin  40 mg Oral Daily   Continuous Infusions: . sodium chloride 100 mL/hr at 08/23/20 1014   . cefTRIAXone (ROCEPHIN)  IV 2 g (08/22/20 2305)     LOS: 2 days      Debbe Odea, MD [facility] Pager: [email or web address] 08/23/2020, 1:24 PM

## 2020-08-23 NOTE — Progress Notes (Addendum)
NAME:  Cheryl Haley, MRN:  [id number], DOB:  [birth year], LOS: 2 ADMISSION DATE:  08/21/2020, CONSULTATION DATE:  08/22/2020 REFERRING MD:  Dr. Wynelle [place], [facility], CHIEF COMPLAINT:  Short of breath   Brief History:  64 yo female, former smoker, with chronic hypoxic (2L) resp failure who presented with progressive dyspnea and weakness.  She had recent hospitalization at [facility] for COPD exacerbation (her only one in several years).  She had persistent dyspnea and fever (temp 101).  She is followed in pulmonary office by [name] and to establish with [name].  She developed fever, decreased appetite, fatigue and weakness earlier this month.  She was admitted to [facility] and treated for COPD exacerbation.  Several days after discharge she developed recurrence of her symptoms.  She also thinks she has lost about 10 lbs.  She has felt her heart rate going fast.  She quit smoking 3-4 years ago.  No recent travel history or sick exposures.  No animal/bird exposures.  Past Medical History:  HTN, PNA, CVA, HLD, COPD with emphysema, Chronic hypoxic respiratory failure, Hypothyroidism, Hiatal hernia, Essential thrombocythemia, OA, Anxiety, Influenza A January 2015  Significant Hospital Events:  12/21 Admit 12/22 PCCM consulted   Consults:    Procedures:    Significant Diagnostic Tests:   Spirometry 12/26/13 >> FEV1 0.8 (33%), FEV1% 41  CT angio chest 08/22/20 >> no acute process, negative for PE, bilateral pulmonary nodules, severe background emphysema  Micro Data:  COVID/flu 12/21 >> negative Blood 12/21 >> GPC 1/2 >>  BCID 12/21 >> strep species detected Blood 12/22 >> RVP 12/22 >> negative   Antimicrobials:  Vancomycin 12/21 Flagyl 12/21  Cefepime 12/21 >>   Interim History / Subjective:  Pt reports feeling better than on admission but not back to baseline  On 2L  Objective   BP (!) 154/66 (BP Location: Right Arm)   Pulse 98   Temp 98.3 F (36.8 C) (Oral)   Resp  19   Wt 50 kg   SpO2 99%   BMI 18.93 kg/m         Intake/Output Summary (Last 24 hours) at 08/23/2020 4696 Last data filed at 08/23/2020 0200 Gross per 24 hour  Intake 820 ml  Output 1050 ml  Net -230 ml   Filed Weights   08/22/20 0228 08/23/20 0259  Weight: 49.3 kg 50 kg    Examination: General: thin adult female sitting up in bed in NAD  HEENT: MM pink/moist, [place] O2, anicteric  Neuro: AAOx4, speech clear, MAE  CV: s1s2 RRR, no m/r/g PULM: non-labored on , pursed lip breathing, able to speak full sentences GI: soft, bsx4 active  Extremities: warm/dry, no edema  Skin: no rashes or lesions, thin skin with multiple areas of ecchymosis   Discussion:  64 yo female with recurrent fever, dyspnea, fatigue, and myalgia.  She was also found to have hypovolemia, hyponatremia, and tachycardia.  Reports recent weight loss.    Assessment & Plan:   Fever -follow up blood cultures, 1/2 with strep. Note she was recently on prednisone for AECOPD -RVP negative  -continue empiric rocephin  Severe COPD with Emphysema. CT review shows severe background emphysema. She does not have frequent flares or hospitalizations. She has been admitted 2x in the last 3 years (both in recent month).  Baseline functional / able to perform ADLs.  -continue brovana, pulmicort (adjust dosing), yupelri while inpatient  -PRN albuterol  -pulmonary hygiene - IS, mobilize -she does not need chronic steroids at this  point and based on symptom burden / flare hx would not warrant risk of chronic prednisone use  -does not need daliresp / azitro at this point  -outpatient follow up with [name].  -assess A1AT as outpatient  -consider changing home spiriva to the respimat form -she needs pulmonary rehab / PT efforts (PT ordered)  Acute on chronic hypoxic respiratory failure. -wean O2 for sats 90-94%  Essential thrombocythemia. JAK2 [id number]+ -follow up as outpatient with [place]  -continue hydroxyurea  + ASA   Hypovolemic hyponatremia. -trend BMP  -encourage PO intake -TSH low normal, cortisol 13 >> will discuss stress dose steroids with MD. Consider relative adrenal insufficiency given recent steroid use and subjective symptoms of weakness / fatigue.   Pulmonary cachexia with BMI 18.64. -nutrition consult  -ensure     Best practice:  Diet: heart healthy DVT prophylaxis: lovenox GI prophylaxis: not indicated Mobility: as tolerated  Labs    CMP Latest Ref Rng & Units 08/22/2020 08/21/2020 07/12/2020  Glucose 70 - 99 mg/dL 112(H) 157(H) -  BUN 8 - 23 mg/dL 13 21 -  Creatinine 0.44 - 1.00 mg/dL 0.74 0.93 -  Sodium 135 - 145 mmol/L 133(L) 126(L) -  Potassium 3.5 - 5.1 mmol/L 3.3(L) 3.1(L) -  Chloride 98 - 111 mmol/L 96(L) 85(L) -  CO2 22 - 32 mmol/L 27 29 -  Calcium 8.9 - 10.3 mg/dL 7.9(L) 8.3(L) -  Total Protein 6.5 - 8.1 g/dL 5.6(L) 6.4(L) -  Total Bilirubin 0.3 - 1.2 mg/dL 0.7 0.7 -  Alkaline Phos 38 - 126 U/L 53 66 -  AST 15 - 41 U/L 26 28 -  ALT 0 - 44 U/L 20 23 25     CBC Latest Ref Rng & Units 08/22/2020 08/21/2020 05/04/2020  WBC 4.0 - 10.5 K/uL 4.1 5.7 4.8  Hemoglobin 12.0 - 15.0 g/dL 10.3(L) 12.2 11.1(L)  Hematocrit 36.0 - 46.0 % 30.3(L) 35.6(L) 33.5(L)  Platelets 150 - 400 K/uL 136(L) 191 269    ABG    Component Value Date/Time   HCO3 32.2 (H) 09/15/2018 1950   O2SAT 80.2 09/15/2018 1950    Lab Results  Component Value Date   TSH 0.372 08/22/2020    Signature:   Noe Gens, MSN, NP-C, AGACNP-BC [facility] 08/23/2020, 9:26 AM   Please see [email or web address] for pager details.

## 2020-08-23 NOTE — Evaluation (Signed)
Physical Therapy Evaluation Patient Details Name: Cheryl Haley MRN: [id number] DOB: 1956/03/05 Today's Date: 08/23/2020   History of Present Illness  64 y.o. year-old w/ hx of tobacco abuse, PNA, HTN, HL, hx CVA/ TIA, COPD/ emphysema brought to ED by EMS for eval of gen'd weakness, SOB and cough.  Clinical Impression  Pt presents to PT session with significant deficits in cardiopulmonary function as well as deficits in strength and mobility. Pt fatigues quickly, requiring breaks from talking due to SOB at rest. Pt is able to perform bed mobility and stand, but fatigues rapidly and requires a return to semi-fowlers with slowed recovery of sats. Pt will benefit from aggressive mobilization and PT POC in an effort to improve activity tolerance and to provide education on energy conservation techniques. PT recommends HHPT and a 4 wheeled walker at the time of discharge, pending improvement in activity tolerance.    Follow Up Recommendations Home health PT;Supervision - Intermittent    Equipment Recommendations   (4 wheeled walker)    Recommendations for Other Services       Precautions / Restrictions Precautions Precautions: Fall Precaution Comments: monitor SpO2 Restrictions Weight Bearing Restrictions: No      Mobility  Bed Mobility Overal bed mobility: Modified Independent             General bed mobility comments: HOB elevated, increased time    Transfers Overall transfer level: Needs assistance Equipment used: None Transfers: Sit to/from Stand Sit to Stand: Supervision            Ambulation/Gait Ambulation/Gait assistance:  (pt declines due to fatigue)              Stairs            Wheelchair Mobility    Modified Rankin (Stroke Patients Only)       Balance Overall balance assessment: Needs assistance Sitting-balance support: No upper extremity supported;Feet supported Sitting balance-Leahy Scale: Good     Standing balance support:  No upper extremity supported Standing balance-Leahy Scale: Fair                               Pertinent Vitals/Pain Pain Assessment: No/denies pain    Home Living Family/patient expects to be discharged to:: Private residence Living Arrangements: Alone Available Help at Discharge: Family;Available 24 hours/day Type of Home: Other(Comment) (barn) Home Access: Level entry     Home Layout: One level Home Equipment: Other (comment) (home O2)      Prior Function Level of Independence: Independent         Comments: pt mobilizes on 2L [place] at baseline     Hand Dominance        Extremity/Trunk Assessment   Upper Extremity Assessment Upper Extremity Assessment: Overall WFL for tasks assessed    Lower Extremity Assessment Lower Extremity Assessment: Generalized weakness    Cervical / Trunk Assessment Cervical / Trunk Assessment: Kyphotic  Communication   Communication: Other (comment) (pt requires frequent breaks to catch her breath, otherwise WFL)  Cognition Arousal/Alertness: Awake/alert Behavior During Therapy: WFL for tasks assessed/performed Overall Cognitive Status: Within Functional Limits for tasks assessed                                        General Comments General comments (skin integrity, edema, etc.): pt on 3L [place] during session, saturating at  91% at rest with HR in 110s. With mobility pt desats to 88% in standing with HR in 120s. Pt continues to desat once sitting to 85% with slow gradual recovery to low 90s over multiple minutes.    Exercises     Assessment/Plan    PT Assessment Patient needs continued PT services  PT Problem List Decreased strength;Decreased activity tolerance;Decreased balance;Decreased mobility;Cardiopulmonary status limiting activity       PT Treatment Interventions DME instruction;Gait training;Functional mobility training;Therapeutic activities;Therapeutic exercise;Balance training;Patient/family  education    PT Goals (Current goals can be found in the Care Plan section)  Acute Rehab PT Goals Patient Stated Goal: to improve breathing and activity tolerance PT Goal Formulation: With patient Time For Goal Achievement: 09/06/20 Potential to Achieve Goals: Good Additional Goals Additional Goal #1: Pt will reports a BORG dyspnea rating of 4/10 or less when ambulating for >50'    Frequency Min 3X/week   Barriers to discharge        Co-evaluation               AM-PAC PT "6 Clicks" Mobility  Outcome Measure Help needed turning from your back to your side while in a flat bed without using bedrails?: None Help needed moving from lying on your back to sitting on the side of a flat bed without using bedrails?: None Help needed moving to and from a bed to a chair (including a wheelchair)?: A Little Help needed standing up from a chair using your arms (e.g., wheelchair or bedside chair)?: A Little Help needed to walk in hospital room?: A Little Help needed climbing 3-5 steps with a railing? : A Lot 6 Click Score: 19    End of Session Equipment Utilized During Treatment: Oxygen Activity Tolerance: Patient limited by fatigue Patient left: in bed;with call bell/phone within reach Nurse Communication: Mobility status PT Visit Diagnosis: Other abnormalities of gait and mobility (R26.89);Muscle weakness (generalized) (M62.81);Other (comment) (cardiopulmonary endurance)    Time: [id number] PT Time Calculation (min) (ACUTE ONLY): 10 min   Charges:   PT Evaluation $PT Eval Moderate Complexity: 1 Mod          Zenaida Niece, PT, DPT Acute Rehabilitation Pager: [phone]   Zenaida Niece 08/23/2020, 5:23 PM

## 2020-08-23 NOTE — Plan of Care (Signed)
  Problem: Activity: Goal: Risk for activity intolerance will decrease Outcome: Progressing   Problem: Safety: Goal: Ability to remain free from injury will improve Outcome: Progressing   

## 2020-08-23 NOTE — Consult Note (Signed)
[facility] for Infectious Disease       Reason for Consult: fever    Referring Physician: Dr. Wynelle Cleveland  Principal Problem:   Sepsis without septic shock [facility]) Active Problems:   COPD (chronic obstructive pulmonary disease) with emphysema (HCC)   HTN (hypertension)   Hypothyroidism   Tobacco abuse   Chronic respiratory failure with hypoxia (HCC)   Essential thrombocythemia ([place])   Hyponatremia   Hypokalemia   . arformoterol  15 mcg Nebulization BID  . aspirin EC  81 mg Oral Daily  . budesonide (PULMICORT) nebulizer solution  0.5 mg Nebulization BID  . diltiazem  60 mg Oral BID  . enoxaparin (LOVENOX) injection  40 mg Subcutaneous Q24H  . feeding supplement  237 mL Oral BID BM  . hydroxyurea  500 mg Oral QODAY  . levothyroxine  88 mcg Oral QAC breakfast  . methylPREDNISolone (SOLU-MEDROL) injection  60 mg Intravenous Q6H  . multivitamin with minerals  1 tablet Oral Daily  . potassium chloride  40 mEq Oral Once  . revefenacin  175 mcg Nebulization Daily  . rosuvastatin  40 mg Oral Daily    Recommendations: Stop antibiotics  Monitor blood cultures TTE (ordered)  Assessment: She has a mild fever trending down with one of 4 blood culture bottles with Strep sanguinous of unclear significance.  Typically found in the mouth and though she has poor dentition, no abscess, no gum swelling, no pain.  No signs of pneumonia with no opacity on CTA.  I do not think the Strep is part of an active bacterial infection so I will stop antibiotics.  May be viral etiology even with the negative RVP.   She has poor air entry on exam and CCM following as well.     Antibiotics: Vancomycin, cefepime, metronidazole, ceftriaxone  HPI: Cheryl Haley is a 64 y.o. female with advanced COPD/emphysema with a recent hospitalization for an exacerbation at [facility] was doing well until she stopped her steroids.  She became more sob after stopping and noted significant tachycardia with any  activity along with worsening DOE.  She is on 2L [place] due to chronic hypoxic respiratory failure and had not had any other exacerbations during the year until 3 weeks ago.  She had not noted any fever before admission.     Review of Systems:  Constitutional: negative for chills Gastrointestinal: negative for nausea and diarrhea Integument/breast: negative for rash All other systems reviewed and are negative    Past Medical History:  Diagnosis Date  . Anxiety   . 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 COPD (chronic obstructive pulmonary disease) ([place])   . Emphysema lung ([place])   . Essential thrombocythemia ([place]) 09/15/2019  . History of hiatal hernia   . Hx of TIA (transient ischemic attack) and stroke   . Hyperlipidemia   . Hypertension   . Hypothyroidism   . Impaired fasting glucose   . Pleural effusion 05/15/2014   CXR 05/2014:  New small right effusion.    . Pneumonia   . Protein-calorie malnutrition, severe 09/17/2018  . Pulmonary nodules 07/14/2018   07/2017 CT chest >pulmonary nodules noted.  07/2017 PET scan -Hypermetabolic LUL nodule  [id number] CT chest >decreased nodule size, new pulmonary nodules noted.  FOB -atypical cells  CT chest 07/2018 >stable nodules to smaller on established nodules . 2 new nodules RUL , RLL >CT chest 3 months   . Thyroid disease   . Tobacco abuse 11/28/2015   Smoked 2 packs of  cigarettes daily from teenage years until age 22, quit in 2016   . Unspecified hypothyroidism 09/20/2013  . Viral respiratory infection 09/16/2018    Social History   Tobacco Use  . Smoking status: Former Smoker    Packs/day: 2.00    Years: 40.00    Pack years: 80.00    Types: Cigarettes    Quit date: 03/16/2017    Years since quitting: 3.4  . Smokeless tobacco: Never Used  . Tobacco comment:    Vaping Use  . Vaping Use: Never used  Substance Use Topics  . Alcohol use: Yes    Alcohol/week: 14.0 standard drinks    Types: 14 Cans of beer per week  . Drug use: No    Family History   Problem Relation Age of Onset  . Emphysema Father   . Heart disease Father   . Emphysema Brother   . Emphysema Sister   . Emphysema Sister   . Heart disease Mother   . Heart disease Sister   . Heart disease Brother   . Heart disease Brother   . Congestive Heart Failure Sister   . Congestive Heart Failure Sister   . Cancer Sister        lung  . Cancer Sister        breast    No Known Allergies  Physical Exam: Constitutional: in no apparent distress  Vitals:   08/23/20 0820 08/23/20 1153  BP: (!) 154/66 139/62  Pulse: 98 (!) 107  Resp: 19 18  Temp: 98.3 F (36.8 C) 98.2 F (36.8 C)  SpO2: 99% 96%   EYES: anicteric ENT: poor dentition, no gum swelling Cardiovascular: Cor Tachy; no murmur Respiratory: decreased air entry, no wheezes Musculoskeletal: no pedal edema noted Skin: negatives: no rash Neuro: non-focal  Lab Results  Component Value Date   WBC 4.1 08/22/2020   HGB 10.3 (L) 08/22/2020   HCT 30.3 (L) 08/22/2020   MCV 94.7 08/22/2020   PLT 136 (L) 08/22/2020    Lab Results  Component Value Date   CREATININE 0.74 08/22/2020   BUN 13 08/22/2020   NA 133 (L) 08/22/2020   K 3.3 (L) 08/22/2020   CL 96 (L) 08/22/2020   CO2 27 08/22/2020    Lab Results  Component Value Date   ALT 20 08/22/2020   AST 26 08/22/2020   ALKPHOS 53 08/22/2020     Microbiology: Recent Results (from the past 240 hour(s))  Resp Panel by RT-PCR (Flu A&B, Covid) Nasopharyngeal Swab     Status: None   Collection Time: 08/21/20  2:07 PM   Specimen: Nasopharyngeal Swab; Nasopharyngeal(NP) swabs in vial transport medium  Result Value Ref Range Status   SARS Coronavirus 2 by RT PCR NEGATIVE NEGATIVE Final    Comment: (NOTE) SARS-CoV-2 target nucleic acids are NOT DETECTED.  The SARS-CoV-2 RNA is generally detectable in upper respiratory specimens during the acute phase of infection. The lowest concentration of SARS-CoV-2 viral copies this assay can detect is 138 copies/mL. A  negative result does not preclude SARS-Cov-2 infection and should not be used as the sole basis for treatment or other patient management decisions. A negative result may occur with  improper specimen collection/handling, submission of specimen other than nasopharyngeal swab, presence of viral mutation(s) within the areas targeted by this assay, and inadequate number of viral copies(<138 copies/mL). A negative result must be combined with clinical observations, patient history, and epidemiological information. The expected result is Negative.  Fact Sheet for Patients:  EntrepreneurPulse.com.au  Fact Sheet for Healthcare Providers:  IncredibleEmployment.be  This test is no t yet approved or cleared by the [place] FDA and  has been authorized for detection and/or diagnosis of SARS-CoV-2 by FDA under an Emergency Use Authorization (EUA). This EUA will remain  in effect (meaning this test can be used) for the duration of the COVID-19 declaration under Section 564(b)(1) of the Act, 21 U.S.C.section 360bbb-3(b)(1), unless the authorization is terminated  or revoked sooner.       Influenza A by PCR NEGATIVE NEGATIVE Final   Influenza B by PCR NEGATIVE NEGATIVE Final    Comment: (NOTE) The Xpert Xpress SARS-CoV-2/FLU/RSV plus assay is intended as an aid in the diagnosis of influenza from Nasopharyngeal swab specimens and should not be used as a sole basis for treatment. Nasal washings and aspirates are unacceptable for Xpert Xpress SARS-CoV-2/FLU/RSV testing.  Fact Sheet for Patients: EntrepreneurPulse.com.au  Fact Sheet for Healthcare Providers: IncredibleEmployment.be  This test is not yet approved or cleared by the [place] FDA and has been authorized for detection and/or diagnosis of SARS-CoV-2 by FDA under an Emergency Use Authorization (EUA). This EUA will remain in effect (meaning this test can  be used) for the duration of the COVID-19 declaration under Section 564(b)(1) of the Act, 21 U.S.C. section 360bbb-3(b)(1), unless the authorization is terminated or revoked.  Performed at [facility], [place] [address] [place], [place] [address]   Blood Culture (routine x 2)     Status: Abnormal   Collection Time: 08/21/20  4:23 PM   Specimen: BLOOD  Result Value Ref Range Status   Specimen Description BLOOD SITE NOT SPECIFIED  Final   Special Requests   Final    BOTTLES DRAWN AEROBIC AND ANAEROBIC Blood Culture adequate volume   Culture  Setup Time   Final    AEROBIC BOTTLE ONLY GRAM POSITIVE COCCI Organism ID to follow CRITICAL RESULT CALLED TO, READ BACK BY AND VERIFIED WITH: T DANG PHARMD 08/22/20 2236 JDW    Culture (A)  Final    STREPTOCOCCUS SANGUINIS THE SIGNIFICANCE OF ISOLATING THIS ORGANISM FROM A SINGLE SET OF BLOOD CULTURES WHEN MULTIPLE SETS ARE DRAWN IS UNCERTAIN. PLEASE NOTIFY THE MICROBIOLOGY DEPARTMENT WITHIN ONE WEEK IF SPECIATION AND SENSITIVITIES ARE REQUIRED. Performed at [facility], [place] [address] [place], [place] [address]    Report Status 08/23/2020 FINAL  Final  Blood Culture ID Panel (Reflexed)     Status: Abnormal   Collection Time: 08/21/20  4:23 PM  Result Value Ref Range Status   Enterococcus faecalis NOT DETECTED NOT DETECTED Final   Enterococcus Faecium NOT DETECTED NOT DETECTED Final   Listeria monocytogenes NOT DETECTED NOT DETECTED Final   Staphylococcus species NOT DETECTED NOT DETECTED Final   Staphylococcus aureus (BCID) NOT DETECTED NOT DETECTED Final   Staphylococcus epidermidis NOT DETECTED NOT DETECTED Final   Staphylococcus lugdunensis NOT DETECTED NOT DETECTED Final   Streptococcus species DETECTED (A) NOT DETECTED Final    Comment: Not Enterococcus species, Streptococcus agalactiae, Streptococcus pyogenes, or Streptococcus pneumoniae. CRITICAL RESULT CALLED TO, READ BACK BY AND VERIFIED WITH: T DANG PHARMD 08/22/20  2236 JDW    Streptococcus agalactiae NOT DETECTED NOT DETECTED Final   Streptococcus pneumoniae NOT DETECTED NOT DETECTED Final   Streptococcus pyogenes NOT DETECTED NOT DETECTED Final   A.calcoaceticus-baumannii NOT DETECTED NOT DETECTED Final   Bacteroides fragilis NOT DETECTED NOT DETECTED Final   Enterobacterales NOT DETECTED NOT DETECTED Final   Enterobacter cloacae complex NOT DETECTED  NOT DETECTED Final   Escherichia coli NOT DETECTED NOT DETECTED Final   Klebsiella aerogenes NOT DETECTED NOT DETECTED Final   Klebsiella oxytoca NOT DETECTED NOT DETECTED Final   Klebsiella pneumoniae NOT DETECTED NOT DETECTED Final   Proteus species NOT DETECTED NOT DETECTED Final   Salmonella species NOT DETECTED NOT DETECTED Final   Serratia marcescens NOT DETECTED NOT DETECTED Final   Haemophilus influenzae NOT DETECTED NOT DETECTED Final   Neisseria meningitidis NOT DETECTED NOT DETECTED Final   Pseudomonas aeruginosa NOT DETECTED NOT DETECTED Final   Stenotrophomonas maltophilia NOT DETECTED NOT DETECTED Final   Candida albicans NOT DETECTED NOT DETECTED Final   Candida auris NOT DETECTED NOT DETECTED Final   Candida glabrata NOT DETECTED NOT DETECTED Final   Candida krusei NOT DETECTED NOT DETECTED Final   Candida parapsilosis NOT DETECTED NOT DETECTED Final   Candida tropicalis NOT DETECTED NOT DETECTED Final   Cryptococcus neoformans/gattii NOT DETECTED NOT DETECTED Final    Comment: Performed at [facility], [place] [address] [place], [place] [address]  Blood Culture (routine x 2)     Status: None (Preliminary result)   Collection Time: 08/22/20 12:38 AM   Specimen: BLOOD RIGHT HAND  Result Value Ref Range Status   Specimen Description BLOOD RIGHT HAND  Final   Special Requests   Final    BOTTLES DRAWN AEROBIC AND ANAEROBIC Blood Culture adequate volume   Culture   Final    NO GROWTH 1 DAY Performed at [facility], [place] [address], [place], [place] [address]    Report  Status PENDING  Incomplete  Respiratory Panel by PCR     Status: None   Collection Time: 08/22/20  1:02 PM   Specimen: Nasopharyngeal Swab; Respiratory  Result Value Ref Range Status   Adenovirus NOT DETECTED NOT DETECTED Final   Coronavirus 229E NOT DETECTED NOT DETECTED Final    Comment: (NOTE) The Coronavirus on the Respiratory Panel, DOES NOT test for the novel  Coronavirus (2019 nCoV)    Coronavirus HKU1 NOT DETECTED NOT DETECTED Final   Coronavirus NL63 NOT DETECTED NOT DETECTED Final   Coronavirus OC43 NOT DETECTED NOT DETECTED Final   Metapneumovirus NOT DETECTED NOT DETECTED Final   Rhinovirus / Enterovirus NOT DETECTED NOT DETECTED Final   Influenza A NOT DETECTED NOT DETECTED Final   Influenza B NOT DETECTED NOT DETECTED Final   Parainfluenza Virus 1 NOT DETECTED NOT DETECTED Final   Parainfluenza Virus 2 NOT DETECTED NOT DETECTED Final   Parainfluenza Virus 3 NOT DETECTED NOT DETECTED Final   Parainfluenza Virus 4 NOT DETECTED NOT DETECTED Final   Respiratory Syncytial Virus NOT DETECTED NOT DETECTED Final   Bordetella pertussis NOT DETECTED NOT DETECTED Final   Bordetella Parapertussis NOT DETECTED NOT DETECTED Final   Chlamydophila pneumoniae NOT DETECTED NOT DETECTED Final   Mycoplasma pneumoniae NOT DETECTED NOT DETECTED Final    Comment: Performed at [facility], [place]. [address], [place], [place] [address]    Damyn Weitzel W Teneil Shiller, [place] for Infectious Disease [facility] [email or web address] 08/23/2020, 4:19 PM

## 2020-08-23 NOTE — Progress Notes (Signed)
Initial Nutrition Assessment  DOCUMENTATION CODES:   Severe malnutrition in context of chronic illness  INTERVENTION:   Ensure Enlive po BID, each supplement provides 350 kcal and 20 grams of protein  Magic cup BID with meals, each supplement provides 290 kcal and 9 grams of protein  MVI with minerals  NUTRITION DIAGNOSIS:   Severe Malnutrition related to chronic illness (COPD) as evidenced by severe muscle depletion,moderate muscle depletion,severe fat depletion,energy intake < or equal to 75% for > or equal to 1 month.  GOAL:   Patient will meet greater than or equal to 90% of their needs  MONITOR:   PO intake,Supplement acceptance,Labs,Weight trends  REASON FOR ASSESSMENT:   Consult Assessment of nutrition requirement/status  ASSESSMENT:    64 y.o. female with PMH of PNA, HTN, CVA, COPD/emphysema is on 2 L nasal cannula at baseline, and tobacco abuse. Recently hospitalized for COPD exacerbation. Admitted for evaluation of generalized weakness, worsening dyspnea, and cough.   Spoke with pt at bedside. Pt sister was also at bedside. Pt states she has had a decreased appetite 2 weeks PTA. Pt states she has been so weak that she doesn't feel up to eating. Pt reports that foods taste different and the only thing she has found that tastes good is green grapes. Pt could not provide a dietary recall as she reports eating primarily green grapes and oranges over the past week. Pt reports drinking only water at home and denies drink protein drinks. Per chart review, pt is consuming 25-90% of documented meals during this admission. RN just provided pt with Ensure prior to RD visit. Pt stated she was going to drink the Ensure. Educated pt on the importance of protein intake with COPD and in preserving lean body mass. Pt sister stated she would get pt protein drink to have a home.   Pt reports a recent 8 lb weight loss but could not provide time frame. Pt stated her UBW is around 115 lbs.  Per chart review, pt weight has remained stable over the past year with a recent decrease from 53.3 kg on 07/12/20 to 50 kg during this admission. This indicates a 6.2% weight loss within a month, which is significant for time frame.   Labs reviewed: Sodium 133, Potassium 3.3, Chloride 96, Corrected Calcium: 9   Medications reviewed and include: MVI with minerals, NS at 100 mL/hr, IV Rocephin   NUTRITION - FOCUSED PHYSICAL EXAM:  Flowsheet Row Most Recent Value  Orbital Region Moderate depletion  Upper Arm Region Moderate depletion  Thoracic and Lumbar Region Unable to assess  Buccal Region Moderate depletion  Temple Region Moderate depletion  Clavicle Bone Region Severe depletion  Clavicle and Acromion Bone Region Severe depletion  Scapular Bone Region Severe depletion  Dorsal Hand Severe depletion  Patellar Region Severe depletion  Anterior Thigh Region Severe depletion  Posterior Calf Region Severe depletion  Edema (RD Assessment) None  Hair Reviewed  Eyes Reviewed  Mouth Reviewed  Skin Reviewed  Nails Reviewed       Diet Order:   Diet Order            Diet Heart Room service appropriate? Yes; Fluid consistency: Thin  Diet effective now                 EDUCATION NEEDS:   Education needs have been addressed  Skin:  Skin Assessment: Reviewed RN Assessment  Last BM:  12/20  Height:   Ht Readings from Last 1 Encounters:  07/12/20 5'  4" (1.626 m)    Weight:   Wt Readings from Last 1 Encounters:  08/23/20 50 kg    Ideal Body Weight:  54.5 kg  BMI:  Body mass index is 18.93 kg/m.  Estimated Nutritional Needs:   Kcal:  1500-1700 kcal  Protein:  75-85 grams  Fluid:  >1.5 L/day    Ronnald Nian, Dietetic Intern Pager: [phone] If unavailable: [phone]

## 2020-08-23 NOTE — Telephone Encounter (Signed)
Please arrange for patient to have hospital follow up with [name]- first available is fine.  This is a former [name] patient and she will need to establish with [name].     Noe Gens, MSN, NP-C, AGACNP-BC [facility] 08/23/2020, 1:12 PM   Please see [email or web address] for pager details.

## 2020-08-24 ENCOUNTER — Inpatient Hospital Stay (HOSPITAL_COMMUNITY): Payer: Medicare Other

## 2020-08-24 DIAGNOSIS — J9622 Acute and chronic respiratory failure with hypercapnia: Secondary | ICD-10-CM

## 2020-08-24 DIAGNOSIS — J9621 Acute and chronic respiratory failure with hypoxia: Secondary | ICD-10-CM

## 2020-08-24 DIAGNOSIS — J441 Chronic obstructive pulmonary disease with (acute) exacerbation: Secondary | ICD-10-CM | POA: Diagnosis not present

## 2020-08-24 LAB — BLOOD GAS, ARTERIAL
Acid-Base Excess: 1 mmol/L (ref 0.0–2.0)
Bicarbonate: 25 mmol/L (ref 20.0–28.0)
FIO2: 32
O2 Saturation: 95.9 %
Patient temperature: 36.9
pCO2 arterial: 38.6 mmHg (ref 32.0–48.0)
pH, Arterial: 7.425 (ref 7.350–7.450)
pO2, Arterial: 74 mmHg — ABNORMAL LOW (ref 83.0–108.0)

## 2020-08-24 LAB — ECHOCARDIOGRAM COMPLETE
AR max vel: 2.6 cm2
AV Area VTI: 3.45 cm2
AV Area mean vel: 2.96 cm2
AV Mean grad: 4.6 mmHg
AV Peak grad: 8.9 mmHg
Ao pk vel: 1.49 m/s
S' Lateral: 2.7 cm
Weight: 1748.8 oz

## 2020-08-24 LAB — CULTURE, BLOOD (ROUTINE X 2): Special Requests: ADEQUATE

## 2020-08-24 MED ORDER — METHYLPREDNISOLONE SODIUM SUCC 125 MG IJ SOLR
60.0000 mg | Freq: Three times a day (TID) | INTRAMUSCULAR | Status: DC
  Administered 2020-08-24 – 2020-08-26 (×6): 60 mg via INTRAVENOUS
  Filled 2020-08-24 (×6): qty 2

## 2020-08-24 NOTE — Progress Notes (Signed)
[facility]   Reason for visit: Follow up on positive blood culture  Interval History: she feels better today with less sob.  Slept well.  WBC wnl.  Remains afebrile.     Physical Exam: Constitutional:  Vitals:   08/24/20 0921 08/24/20 1004  BP:  (!) 158/72  Pulse: (!) 107   Resp: 18   Temp:    SpO2: 95% 95%   patient appears in NAD Respiratory: normal respiratory effort at rest; distant, no wheezes Cardiovascular: RRR  Review of Systems: Constitutional: negative for fevers and chills Gastrointestinal: negative for diarrhea  Lab Results  Component Value Date   WBC 4.1 08/22/2020   HGB 10.3 (L) 08/22/2020   HCT 30.3 (L) 08/22/2020   MCV 94.7 08/22/2020   PLT 136 (L) 08/22/2020    Lab Results  Component Value Date   CREATININE 0.74 08/22/2020   BUN 13 08/22/2020   NA 133 (L) 08/22/2020   K 3.3 (L) 08/22/2020   CL 96 (L) 08/22/2020   CO2 27 08/22/2020    Lab Results  Component Value Date   ALT 20 08/22/2020   AST 26 08/22/2020   ALKPHOS 53 08/22/2020     Microbiology: Recent Results (from the past 240 hour(s))  Resp Panel by RT-PCR (Flu A&B, Covid) Nasopharyngeal Swab     Status: None   Collection Time: 08/21/20  2:07 PM   Specimen: Nasopharyngeal Swab; Nasopharyngeal(NP) swabs in vial transport medium  Result Value Ref Range Status   SARS Coronavirus 2 by RT PCR NEGATIVE NEGATIVE Final    Comment: (NOTE) SARS-CoV-2 target nucleic acids are NOT DETECTED.  The SARS-CoV-2 RNA is generally detectable in upper respiratory specimens during the acute phase of infection. The lowest concentration of SARS-CoV-2 viral copies this assay can detect is 138 copies/mL. A negative result does not preclude SARS-Cov-2 infection and should not be used as the sole basis for treatment or other patient management decisions. A negative result may occur with  improper specimen collection/handling, submission of specimen other than nasopharyngeal  swab, presence of viral mutation(s) within the areas targeted by this assay, and inadequate number of viral copies(<138 copies/mL). A negative result must be combined with clinical observations, patient history, and epidemiological information. The expected result is Negative.  Fact Sheet for Patients:  BloggerCourse.comhttps://www.fda.gov/media/152166/download  Fact Sheet for Healthcare Providers:  SeriousBroker.ithttps://www.fda.gov/media/152162/download  This test is no t yet approved or cleared by the [place] FDA and  has been authorized for detection and/or diagnosis of SARS-CoV-2 by FDA under an Emergency Use Authorization (EUA). This EUA will remain  in effect (meaning this test can be used) for the duration of the COVID-19 declaration under Section 564(b)(1) of the Act, 21 U.S.C.section 360bbb-3(b)(1), unless the authorization is terminated  or revoked sooner.       Influenza A by PCR NEGATIVE NEGATIVE Final   Influenza B by PCR NEGATIVE NEGATIVE Final    Comment: (NOTE) The Xpert Xpress SARS-CoV-2/FLU/RSV plus assay is intended as an aid in the diagnosis of influenza from Nasopharyngeal swab specimens and should not be used as a sole basis for treatment. Nasal washings and aspirates are unacceptable for Xpert Xpress SARS-CoV-2/FLU/RSV testing.  Fact Sheet for Patients: BloggerCourse.comhttps://www.fda.gov/media/152166/download  Fact Sheet for Healthcare Providers: SeriousBroker.ithttps://www.fda.gov/media/152162/download  This test is not yet approved or cleared by the [place] FDA and has been authorized for detection and/or diagnosis of SARS-CoV-2 by FDA under an Emergency Use Authorization (EUA). This EUA will remain in effect (meaning  this test can be used) for the duration of the COVID-19 declaration under Section 564(b)(1) of the Act, 21 U.S.C. section 360bbb-3(b)(1), unless the authorization is terminated or revoked.  Performed at [facility], [place] [address], [place], [place] [address]   Blood  Culture (routine x 2)     Status: Abnormal   Collection Time: 08/21/20  4:23 PM   Specimen: BLOOD  Result Value Ref Range Status   Specimen Description BLOOD SITE NOT SPECIFIED  Final   Special Requests   Final    BOTTLES DRAWN AEROBIC AND ANAEROBIC Blood Culture adequate volume   Culture  Setup Time   Final    AEROBIC BOTTLE ONLY GRAM POSITIVE COCCI Organism ID to follow CRITICAL RESULT CALLED TO, READ BACK BY AND VERIFIED WITH: T DANG PHARMD 08/22/20 2236 JDW    Culture (A)  Final    STREPTOCOCCUS SANGUINIS THE SIGNIFICANCE OF ISOLATING THIS ORGANISM FROM A SINGLE SET OF BLOOD CULTURES WHEN MULTIPLE SETS ARE DRAWN IS UNCERTAIN. PLEASE NOTIFY THE MICROBIOLOGY DEPARTMENT WITHIN ONE WEEK IF SPECIATION AND SENSITIVITIES ARE REQUIRED. Performed at [facility], [place] [address] [place], [place] [address]    Report Status 08/24/2020 FINAL  Final  Blood Culture ID Panel (Reflexed)     Status: Abnormal   Collection Time: 08/21/20  4:23 PM  Result Value Ref Range Status   Enterococcus faecalis NOT DETECTED NOT DETECTED Final   Enterococcus Faecium NOT DETECTED NOT DETECTED Final   Listeria monocytogenes NOT DETECTED NOT DETECTED Final   Staphylococcus species NOT DETECTED NOT DETECTED Final   Staphylococcus aureus (BCID) NOT DETECTED NOT DETECTED Final   Staphylococcus epidermidis NOT DETECTED NOT DETECTED Final   Staphylococcus lugdunensis NOT DETECTED NOT DETECTED Final   Streptococcus species DETECTED (A) NOT DETECTED Final    Comment: Not Enterococcus species, Streptococcus agalactiae, Streptococcus pyogenes, or Streptococcus pneumoniae. CRITICAL RESULT CALLED TO, READ BACK BY AND VERIFIED WITH: T DANG PHARMD 08/22/20 2236 JDW    Streptococcus agalactiae NOT DETECTED NOT DETECTED Final   Streptococcus pneumoniae NOT DETECTED NOT DETECTED Final   Streptococcus pyogenes NOT DETECTED NOT DETECTED Final   A.calcoaceticus-baumannii NOT DETECTED NOT DETECTED Final   Bacteroides  fragilis NOT DETECTED NOT DETECTED Final   Enterobacterales NOT DETECTED NOT DETECTED Final   Enterobacter cloacae complex NOT DETECTED NOT DETECTED Final   Escherichia coli NOT DETECTED NOT DETECTED Final   Klebsiella aerogenes NOT DETECTED NOT DETECTED Final   Klebsiella oxytoca NOT DETECTED NOT DETECTED Final   Klebsiella pneumoniae NOT DETECTED NOT DETECTED Final   Proteus species NOT DETECTED NOT DETECTED Final   Salmonella species NOT DETECTED NOT DETECTED Final   Serratia marcescens NOT DETECTED NOT DETECTED Final   Haemophilus influenzae NOT DETECTED NOT DETECTED Final   Neisseria meningitidis NOT DETECTED NOT DETECTED Final   Pseudomonas aeruginosa NOT DETECTED NOT DETECTED Final   Stenotrophomonas maltophilia NOT DETECTED NOT DETECTED Final   Candida albicans NOT DETECTED NOT DETECTED Final   Candida auris NOT DETECTED NOT DETECTED Final   Candida glabrata NOT DETECTED NOT DETECTED Final   Candida krusei NOT DETECTED NOT DETECTED Final   Candida parapsilosis NOT DETECTED NOT DETECTED Final   Candida tropicalis NOT DETECTED NOT DETECTED Final   Cryptococcus neoformans/gattii NOT DETECTED NOT DETECTED Final    Comment: Performed at [facility], [address], [place], [place] [address]  Blood Culture (routine x 2)     Status: None (Preliminary result)   Collection Time:  08/22/20 12:38 AM   Specimen: BLOOD RIGHT HAND  Result Value Ref Range Status   Specimen Description BLOOD RIGHT HAND  Final   Special Requests   Final    BOTTLES DRAWN AEROBIC AND ANAEROBIC Blood Culture adequate volume   Culture   Final    NO GROWTH 1 DAY Performed at [facility], [address] [place], [place] [address]    Report Status PENDING  Incomplete  Respiratory Panel by PCR     Status: None   Collection Time: 08/22/20  1:02 PM   Specimen: Nasopharyngeal Swab; Respiratory  Result Value Ref Range Status   Adenovirus NOT DETECTED NOT DETECTED Final   Coronavirus 229E NOT DETECTED  NOT DETECTED Final    Comment: (NOTE) The Coronavirus on the Respiratory Panel, DOES NOT test for the novel  Coronavirus (2019 nCoV)    Coronavirus HKU1 NOT DETECTED NOT DETECTED Final   Coronavirus NL63 NOT DETECTED NOT DETECTED Final   Coronavirus OC43 NOT DETECTED NOT DETECTED Final   Metapneumovirus NOT DETECTED NOT DETECTED Final   Rhinovirus / Enterovirus NOT DETECTED NOT DETECTED Final   Influenza A NOT DETECTED NOT DETECTED Final   Influenza B NOT DETECTED NOT DETECTED Final   Parainfluenza Virus 1 NOT DETECTED NOT DETECTED Final   Parainfluenza Virus 2 NOT DETECTED NOT DETECTED Final   Parainfluenza Virus 3 NOT DETECTED NOT DETECTED Final   Parainfluenza Virus 4 NOT DETECTED NOT DETECTED Final   Respiratory Syncytial Virus NOT DETECTED NOT DETECTED Final   Bordetella pertussis NOT DETECTED NOT DETECTED Final   Bordetella Parapertussis NOT DETECTED NOT DETECTED Final   Chlamydophila pneumoniae NOT DETECTED NOT DETECTED Final   Mycoplasma pneumoniae NOT DETECTED NOT DETECTED Final    Comment: Performed at [facility], [place]. [address], [place], [place] [address]    Impression/Plan:  1. Strep sanguinous positive blood culture - remains in 1 of 4 bottles.  No new growth.  Clinically no current signs or symptoms of infection.   Continue off of antibiotics  2.  Acute on chronic respiratory failure - she feels improved on steroids.    I will sign off, call with questions.

## 2020-08-24 NOTE — Progress Notes (Signed)
PROGRESS NOTE    Cheryl Haley   [id number]  DOB: 1956-06-21  DOA: 08/21/2020 PCP: [name], MD   Brief Narrative:  Cheryl Haley is a 64 y.o. year-old w/ hx of tobacco abuse, PNA, HTN, HL, hx CVA/ TIA, COPD/ emphysema brought to ED by EMS for eval of gen'd weakness, SOB and cough.  Seen at outside hospital recently and told may have COPD exacerbation, she rec'd Rx for prednisone for 10 days. She took the medication and was feeling a little better , but then again felt worsening dyspnea. She presented to the ED and was found to have a temp of 101.6. Given Vanc and Cefepime and admitted to the hospital.    Subjective: She is feels slightly better today in regards to dyspnea. Still very weak with poor appetite.    Assessment & Plan:   Principal Problem: Acute on chronic respiratory failure with fever /   COPD (chronic obstructive pulmonary disease) with emphysema   - CT of the chest ordered to look for pneumonia and is unrevealing for an infection- No PE found either - appreciate pulmonary eval- She was started on Solumedrol on 12/23 and admits to feeling a little better  Active Problems:  Sepsis-  due to bacteremia? Vs viral lung infection > blood culture 1/4 reveals Strep sanguinous started on Ceftriaxone on 12/22 - Temp 100.5 on 12/22 and has not recurred since - ID feels it may be a contaminant and Ceftriaxone was stopped on 12/23- following for fevers- none so far   Sinus tachycardia  -home Cardizem and Toprol were on hold for low BPs- Cardizem resumed   Hypovolemia - Improved with IVF which have been stopped - follow oral intake today  Cachexia (? Related to COPD) - add dietary supplements- nutrition consulted   Hypokalemia - replaced - follow     Hypothyroidism -cont Synthroid    Tobacco abuse       Essential thrombocythemia - cont Hydroxyurea     Time spent in minutes: 35 DVT prophylaxis: enoxaparin (LOVENOX) injection 40 mg Start: 08/21/20  2200  Code Status: Full code Family Communication:  Disposition Plan:  Status is: Inpatient  Remains inpatient appropriate because:Ongoing diagnostic testing needed not appropriate for outpatient work up   Dispo: The patient is from: Home              Anticipated d/c is to: Home              Anticipated d/c date is: 1 day              Patient currently is not medically stable to d/c.   Consultants:   pulmonary Procedures:    Antimicrobials:  Anti-infectives (From admission, onward)   Start     Dose/Rate Route Frequency Ordered Stop   08/22/20 2300  cefTRIAXone (ROCEPHIN) 2 g in sodium chloride 0.9 % 100 mL IVPB  Status:  Discontinued        2 g 200 mL/hr over 30 Minutes Intravenous Every 24 hours 08/22/20 2248 08/23/20 1631   08/22/20 1230  oseltamivir (TAMIFLU) capsule 30 mg  Status:  Discontinued        30 mg Oral 2 times daily 08/22/20 1142 08/22/20 1200   08/22/20 0500  vancomycin (VANCOREADY) IVPB 500 mg/100 mL  Status:  Discontinued        500 mg 100 mL/hr over 60 Minutes Intravenous Every 12 hours 08/21/20 1659 08/22/20 1142   08/22/20 0000  ceFEPIme (MAXIPIME) 2 g in sodium chloride  0.9 % 100 mL IVPB  Status:  Discontinued        2 g 200 mL/hr over 30 Minutes Intravenous Every 8 hours 08/21/20 1659 08/22/20 1142   08/21/20 1600  ceFEPIme (MAXIPIME) 2 g in sodium chloride 0.9 % 100 mL IVPB        2 g 200 mL/hr over 30 Minutes Intravenous  Once 08/21/20 1557 08/21/20 1659   08/21/20 1600  vancomycin (VANCOCIN) IVPB 1000 mg/200 mL premix        1,000 mg 200 mL/hr over 60 Minutes Intravenous  Once 08/21/20 1557 08/21/20 1811   08/21/20 1600  metroNIDAZOLE (FLAGYL) tablet 500 mg  Status:  Discontinued        500 mg Oral Every 8 hours 08/21/20 1557 08/22/20 1150       Objective: Vitals:   08/24/20 0155 08/24/20 0339 08/24/20 0921 08/24/20 1004  BP:  123/65  (!) 158/72  Pulse:  88 (!) 107   Resp:  18 18   Temp:  98.4 F (36.9 C)    TempSrc:  Oral    SpO2:   98% 95% 95%  Weight: 49.6 kg       Intake/Output Summary (Last 24 hours) at 08/24/2020 1412 Last data filed at 08/24/2020 1240 Gross per 24 hour  Intake 560 ml  Output 1600 ml  Net -1040 ml   Filed Weights   08/22/20 0228 08/23/20 0259 08/24/20 0155  Weight: 49.3 kg 50 kg 49.6 kg    Examination: General exam: Appears comfortable  HEENT: PERRLA, oral mucosa moist, no sclera icterus or thrush Respiratory system: Clear to auscultation. Respiratory effort normal. Cardiovascular system: S1 & S2 heard,  No murmurs  Gastrointestinal system: Abdomen soft, non-tender, nondistended. Normal bowel sounds   Central nervous system: Alert and oriented. No focal neurological deficits. Extremities: No cyanosis, clubbing or edema Skin: No rashes or ulcers Psychiatry:  Mood & affect appropriate.    Data Reviewed: I have personally reviewed following labs and imaging studies  CBC: Recent Labs  Lab 08/21/20 1406 08/22/20 0038  WBC 5.7 4.1  NEUTROABS 4.7  --   HGB 12.2 10.3*  HCT 35.6* 30.3*  MCV 94.7 94.7  PLT 191 [id number]*   Basic Metabolic Panel: Recent Labs  Lab 08/21/20 1406 08/22/20 0038  NA 126* 133*  K 3.1* 3.3*  CL 85* 96*  CO2 29 27  GLUCOSE 157* 112*  BUN 21 13  CREATININE 0.93 0.74  CALCIUM 8.3* 7.9*  MG  --  1.9   GFR: Estimated Creatinine Clearance: 54.2 mL/min (by C-G formula based on SCr of 0.74 mg/dL). Liver Function Tests: Recent Labs  Lab 08/21/20 1406 08/22/20 0038  AST 28 26  ALT 23 20  ALKPHOS 66 53  BILITOT 0.7 0.7  PROT 6.4* 5.6*  ALBUMIN 3.0* 2.6*   No results for input(s): LIPASE, AMYLASE in the last 168 hours. No results for input(s): AMMONIA in the last 168 hours. Coagulation Profile: No results for input(s): INR, PROTIME in the last 168 hours. Cardiac Enzymes: No results for input(s): CKTOTAL, CKMB, CKMBINDEX, TROPONINI in the last 168 hours. BNP (last 3 results) No results for input(s): PROBNP in the last 8760 hours. HbA1C: No results  for input(s): HGBA1C in the last 72 hours. CBG: No results for input(s): GLUCAP in the last 168 hours. Lipid Profile: No results for input(s): CHOL, HDL, LDLCALC, TRIG, CHOLHDL, LDLDIRECT in the last 72 hours. Thyroid Function Tests: Recent Labs    08/22/20 1606  TSH 0.372  Anemia Panel: No results for input(s): VITAMINB12, FOLATE, FERRITIN, TIBC, IRON, RETICCTPCT in the last 72 hours. Urine analysis:    Component Value Date/Time   COLORURINE YELLOW 08/21/2020 1822   APPEARANCEUR HAZY (A) 08/21/2020 1822   LABSPEC 1.013 08/21/2020 1822   PHURINE 6.0 08/21/2020 1822   GLUCOSEU 150 (A) 08/21/2020 1822   HGBUR SMALL (A) 08/21/2020 1822   BILIRUBINUR NEGATIVE 08/21/2020 1822   KETONESUR 5 (A) 08/21/2020 1822   PROTEINUR 100 (A) 08/21/2020 1822   UROBILINOGEN 1.0 09/20/2013 1621   NITRITE NEGATIVE 08/21/2020 1822   LEUKOCYTESUR NEGATIVE 08/21/2020 1822   Sepsis Labs: @LABRCNTIP (procalcitonin:4,lacticidven:4) ) Recent Results (from the past 240 hour(s))  Resp Panel by RT-PCR (Flu A&B, Covid) Nasopharyngeal Swab     Status: None   Collection Time: 08/21/20  2:07 PM   Specimen: Nasopharyngeal Swab; Nasopharyngeal(NP) swabs in vial transport medium  Result Value Ref Range Status   SARS Coronavirus 2 by RT PCR NEGATIVE NEGATIVE Final    Comment: (NOTE) SARS-CoV-2 target nucleic acids are NOT DETECTED.  The SARS-CoV-2 RNA is generally detectable in upper respiratory specimens during the acute phase of infection. The lowest concentration of SARS-CoV-2 viral copies this assay can detect is 138 copies/mL. A negative result does not preclude SARS-Cov-2 infection and should not be used as the sole basis for treatment or other patient management decisions. A negative result may occur with  improper specimen collection/handling, submission of specimen other than nasopharyngeal swab, presence of viral mutation(s) within the areas targeted by this assay, and inadequate number of  viral copies(<138 copies/mL). A negative result must be combined with clinical observations, patient history, and epidemiological information. The expected result is Negative.  Fact Sheet for Patients:  EntrepreneurPulse.com.au  Fact Sheet for Healthcare Providers:  IncredibleEmployment.be  This test is no t yet approved or cleared by the [place] FDA and  has been authorized for detection and/or diagnosis of SARS-CoV-2 by FDA under an Emergency Use Authorization (EUA). This EUA will remain  in effect (meaning this test can be used) for the duration of the COVID-19 declaration under Section 564(b)(1) of the Act, 21 U.S.C.section 360bbb-3(b)(1), unless the authorization is terminated  or revoked sooner.       Influenza A by PCR NEGATIVE NEGATIVE Final   Influenza B by PCR NEGATIVE NEGATIVE Final    Comment: (NOTE) The Xpert Xpress SARS-CoV-2/FLU/RSV plus assay is intended as an aid in the diagnosis of influenza from Nasopharyngeal swab specimens and should not be used as a sole basis for treatment. Nasal washings and aspirates are unacceptable for Xpert Xpress SARS-CoV-2/FLU/RSV testing.  Fact Sheet for Patients: EntrepreneurPulse.com.au  Fact Sheet for Healthcare Providers: IncredibleEmployment.be  This test is not yet approved or cleared by the [place] FDA and has been authorized for detection and/or diagnosis of SARS-CoV-2 by FDA under an Emergency Use Authorization (EUA). This EUA will remain in effect (meaning this test can be used) for the duration of the COVID-19 declaration under Section 564(b)(1) of the Act, 21 U.S.C. section 360bbb-3(b)(1), unless the authorization is terminated or revoked.  Performed at [facility], [place] [address], [place], [place] [address]   Blood Culture (routine x 2)     Status: Abnormal   Collection Time: 08/21/20  4:23 PM   Specimen: BLOOD  Result  Value Ref Range Status   Specimen Description BLOOD SITE NOT SPECIFIED  Final   Special Requests   Final    BOTTLES DRAWN AEROBIC AND ANAEROBIC Blood Culture adequate  volume   Culture  Setup Time   Final    AEROBIC BOTTLE ONLY GRAM POSITIVE COCCI Organism ID to follow CRITICAL RESULT CALLED TO, READ BACK BY AND VERIFIED WITH: T DANG PHARMD 08/22/20 2236 JDW    Culture (A)  Final    STREPTOCOCCUS SANGUINIS THE SIGNIFICANCE OF ISOLATING THIS ORGANISM FROM A SINGLE SET OF BLOOD CULTURES WHEN MULTIPLE SETS ARE DRAWN IS UNCERTAIN. PLEASE NOTIFY THE MICROBIOLOGY DEPARTMENT WITHIN ONE WEEK IF SPECIATION AND SENSITIVITIES ARE REQUIRED. Performed at [facility], [place] [address], [place], [place] [address]    Report Status 08/24/2020 FINAL  Final  Blood Culture ID Panel (Reflexed)     Status: Abnormal   Collection Time: 08/21/20  4:23 PM  Result Value Ref Range Status   Enterococcus faecalis NOT DETECTED NOT DETECTED Final   Enterococcus Faecium NOT DETECTED NOT DETECTED Final   Listeria monocytogenes NOT DETECTED NOT DETECTED Final   Staphylococcus species NOT DETECTED NOT DETECTED Final   Staphylococcus aureus (BCID) NOT DETECTED NOT DETECTED Final   Staphylococcus epidermidis NOT DETECTED NOT DETECTED Final   Staphylococcus lugdunensis NOT DETECTED NOT DETECTED Final   Streptococcus species DETECTED (A) NOT DETECTED Final    Comment: Not Enterococcus species, Streptococcus agalactiae, Streptococcus pyogenes, or Streptococcus pneumoniae. CRITICAL RESULT CALLED TO, READ BACK BY AND VERIFIED WITH: T DANG PHARMD 08/22/20 2236 JDW    Streptococcus agalactiae NOT DETECTED NOT DETECTED Final   Streptococcus pneumoniae NOT DETECTED NOT DETECTED Final   Streptococcus pyogenes NOT DETECTED NOT DETECTED Final   A.calcoaceticus-baumannii NOT DETECTED NOT DETECTED Final   Bacteroides fragilis NOT DETECTED NOT DETECTED Final   Enterobacterales NOT DETECTED NOT DETECTED Final   Enterobacter  cloacae complex NOT DETECTED NOT DETECTED Final   Escherichia coli NOT DETECTED NOT DETECTED Final   Klebsiella aerogenes NOT DETECTED NOT DETECTED Final   Klebsiella oxytoca NOT DETECTED NOT DETECTED Final   Klebsiella pneumoniae NOT DETECTED NOT DETECTED Final   Proteus species NOT DETECTED NOT DETECTED Final   Salmonella species NOT DETECTED NOT DETECTED Final   Serratia marcescens NOT DETECTED NOT DETECTED Final   Haemophilus influenzae NOT DETECTED NOT DETECTED Final   Neisseria meningitidis NOT DETECTED NOT DETECTED Final   Pseudomonas aeruginosa NOT DETECTED NOT DETECTED Final   Stenotrophomonas maltophilia NOT DETECTED NOT DETECTED Final   Candida albicans NOT DETECTED NOT DETECTED Final   Candida auris NOT DETECTED NOT DETECTED Final   Candida glabrata NOT DETECTED NOT DETECTED Final   Candida krusei NOT DETECTED NOT DETECTED Final   Candida parapsilosis NOT DETECTED NOT DETECTED Final   Candida tropicalis NOT DETECTED NOT DETECTED Final   Cryptococcus neoformans/gattii NOT DETECTED NOT DETECTED Final    Comment: Performed at [facility], [address], [place], [place] [address]  Blood Culture (routine x 2)     Status: None (Preliminary result)   Collection Time: 08/22/20 12:38 AM   Specimen: BLOOD RIGHT HAND  Result Value Ref Range Status   Specimen Description BLOOD RIGHT HAND  Final   Special Requests   Final    BOTTLES DRAWN AEROBIC AND ANAEROBIC Blood Culture adequate volume   Culture   Final    NO GROWTH 1 DAY Performed at [facility], [place] [address] [place], [place] [address]    Report Status PENDING  Incomplete  Respiratory Panel by PCR     Status: None   Collection Time: 08/22/20  1:02 PM   Specimen: Nasopharyngeal Swab; Respiratory  Result Value Ref Range Status   Adenovirus NOT DETECTED NOT DETECTED Final   Coronavirus 229E NOT DETECTED NOT DETECTED Final    Comment: (NOTE) The Coronavirus on the Respiratory Panel, DOES NOT test for the  novel  Coronavirus (2019 nCoV)    Coronavirus HKU1 NOT DETECTED NOT DETECTED Final   Coronavirus NL63 NOT DETECTED NOT DETECTED Final   Coronavirus OC43 NOT DETECTED NOT DETECTED Final   Metapneumovirus NOT DETECTED NOT DETECTED Final   Rhinovirus / Enterovirus NOT DETECTED NOT DETECTED Final   Influenza A NOT DETECTED NOT DETECTED Final   Influenza B NOT DETECTED NOT DETECTED Final   Parainfluenza Virus 1 NOT DETECTED NOT DETECTED Final   Parainfluenza Virus 2 NOT DETECTED NOT DETECTED Final   Parainfluenza Virus 3 NOT DETECTED NOT DETECTED Final   Parainfluenza Virus 4 NOT DETECTED NOT DETECTED Final   Respiratory Syncytial Virus NOT DETECTED NOT DETECTED Final   Bordetella pertussis NOT DETECTED NOT DETECTED Final   Bordetella Parapertussis NOT DETECTED NOT DETECTED Final   Chlamydophila pneumoniae NOT DETECTED NOT DETECTED Final   Mycoplasma pneumoniae NOT DETECTED NOT DETECTED Final    Comment: Performed at [facility], [place] [address], [place], [place] [address]         Radiology Studies: CT ANGIO CHEST PE W OR WO CONTRAST  Result Date: 08/22/2020 CLINICAL DATA:  64 year old female with concern for pulmonary embolism. EXAM: CT ANGIOGRAPHY CHEST WITH CONTRAST TECHNIQUE: Multidetector CT imaging of the chest was performed using the standard protocol during bolus administration of intravenous contrast. Multiplanar CT image reconstructions and MIPs were obtained to evaluate the vascular anatomy. CONTRAST:  12mL OMNIPAQUE IOHEXOL 350 MG/ML SOLN COMPARISON:  Chest CT dated 06/28/2020. FINDINGS: Cardiovascular: There is no cardiomegaly or pericardial effusion. There is coronary vascular calcification. Moderate atherosclerotic calcification of the thoracic aorta. No aneurysmal dilatation or dissection. The origins of the great vessels of the aortic arch appear patent as visualized. Evaluation of the pulmonary arteries is somewhat limited due to respiratory motion artifact. No  pulmonary artery embolus identified. Mediastinum/Nodes: There is no hilar or mediastinal adenopathy. The esophagus is grossly unremarkable. No mediastinal fluid collection. Lungs/Pleura: Severe emphysema. There are bibasilar linear atelectasis/scarring. There is a 9 mm nodule in the left upper lobe and a 12 mm nodule along the minor fissure similar to prior CT. No new consolidation. There is no pleural effusion pneumothorax. The central airways are patent. Upper Abdomen: No acute abnormality. Musculoskeletal: Osteopenia with degenerative changes of the spine. No acute osseous pathology. Review of the MIP images confirms the above findings. IMPRESSION: 1. No acute intrathoracic pathology. No CT evidence of pulmonary embolism. 2. Bilateral pulmonary nodules as seen on the prior CT. Given background of severe emphysema consider annual low dose CT cancer screening follow-up as per recommendation of prior CT. 3. Aortic Atherosclerosis (ICD10-I70.0) and Emphysema (ICD10-J43.9). Electronically Signed   By: Anner Crete M.D.   On: 08/22/2020 15:30   ECHOCARDIOGRAM COMPLETE  Result Date: 08/24/2020    ECHOCARDIOGRAM REPORT   Patient Name:   Cheryl Haley Date of Exam: 08/24/2020 Medical Rec #:  [id number]       Height:       64.0 in Accession #:    [id number]      Weight:       109.3 lb Date of Birth:  09/25/1955      BSA:          1.513 m Patient Age:    25 years  BP:           123/65 mmHg Patient Gender: F               HR:           117 bpm. Exam Location:  Inpatient Procedure: 2D Echo, Color Doppler and Cardiac Doppler Indications:    Acute Respiratory Distress R06.03  History:        Patient has no prior history of Echocardiogram examinations.                 COPD; Risk Factors:Hypertension, Dyslipidemia and Former Smoker.  Sonographer:    Raquel Sarna Senior RDCS Referring Phys: [place]  Sonographer Comments: Scanned upright due to dyspnea. IMPRESSIONS  1. Left ventricular ejection fraction, by  estimation, is 55 to 60%. The left ventricle has normal function. The left ventricle has no regional wall motion abnormalities. Left ventricular diastolic parameters are indeterminate.  2. Right ventricular systolic function is normal. The right ventricular size is normal.  3. The mitral valve is normal in structure. No evidence of mitral valve regurgitation. No evidence of mitral stenosis. Moderate mitral annular calcification.  4. The aortic valve was not well visualized. There is mild calcification of the aortic valve. There is mild thickening of the aortic valve. Aortic valve regurgitation is not visualized. No aortic stenosis is present.  5. The inferior vena cava is normal in size with greater than 50% respiratory variability, suggesting right atrial pressure of 3 mmHg. FINDINGS  Left Ventricle: Left ventricular ejection fraction, by estimation, is 55 to 60%. The left ventricle has normal function. The left ventricle has no regional wall motion abnormalities. The left ventricular internal cavity size was normal in size. There is  no left ventricular hypertrophy. Left ventricular diastolic parameters are indeterminate. Right Ventricle: The right ventricular size is normal. No increase in right ventricular wall thickness. Right ventricular systolic function is normal. Left Atrium: Left atrial size was normal in size. Right Atrium: Right atrial size was normal in size. Pericardium: There is no evidence of pericardial effusion. Mitral Valve: The mitral valve is normal in structure. There is mild thickening of the mitral valve leaflet(s). There is mild calcification of the mitral valve leaflet(s). Moderate mitral annular calcification. No evidence of mitral valve regurgitation. No evidence of mitral valve stenosis. Tricuspid Valve: The tricuspid valve is normal in structure. Tricuspid valve regurgitation is not demonstrated. No evidence of tricuspid stenosis. Aortic Valve: The aortic valve was not well visualized.  There is mild calcification of the aortic valve. There is mild thickening of the aortic valve. There is mild aortic valve annular calcification. Aortic valve regurgitation is not visualized. No aortic stenosis is present. Aortic valve mean gradient measures 4.6 mmHg. Aortic valve peak gradient measures 8.9 mmHg. Aortic valve area, by VTI measures 3.45 cm. Pulmonic Valve: The pulmonic valve was not well visualized. Pulmonic valve regurgitation is not visualized. No evidence of pulmonic stenosis. Aorta: The aortic root is normal in size and structure. Pulmonary Artery: Indeterminant PASP, inadequate TR jet. Venous: The inferior vena cava is normal in size with greater than 50% respiratory variability, suggesting right atrial pressure of 3 mmHg. IAS/Shunts: No atrial level shunt detected by color flow Doppler.  LEFT VENTRICLE PLAX 2D LVIDd:         3.80 cm LVIDs:         2.70 cm LV PW:         1.00 cm LV IVS:  0.90 cm LVOT diam:     1.90 cm LV SV:         85 LV SV Index:   56 LVOT Area:     2.84 cm  RIGHT VENTRICLE RV S prime:     14.00 cm/s TAPSE (M-mode): 2.0 cm LEFT ATRIUM             Index       RIGHT ATRIUM           Index LA diam:        3.20 cm 2.11 cm/m  RA Area:     10.50 cm LA Vol (A2C):   43.7 ml 28.88 ml/m RA Volume:   22.60 ml  14.93 ml/m LA Vol (A4C):   18.7 ml 12.36 ml/m LA Biplane Vol: 30.8 ml 20.35 ml/m  AORTIC VALVE AV Area (Vmax):    2.60 cm AV Area (Vmean):   2.96 cm AV Area (VTI):     3.45 cm AV Vmax:           149.31 cm/s AV Vmean:          100.407 cm/s AV VTI:            0.247 m AV Peak Grad:      8.9 mmHg AV Mean Grad:      4.6 mmHg LVOT Vmax:         137.00 cm/s LVOT Vmean:        105.000 cm/s LVOT VTI:          0.301 m LVOT/AV VTI ratio: 1.22  AORTA Ao Root diam: 2.70 cm Ao Asc diam:  3.10 cm  SHUNTS Systemic VTI:  0.30 m Systemic Diam: 1.90 cm Carlyle Dolly MD Electronically signed by Carlyle Dolly MD Signature Date/Time: 08/24/2020/10:51:51 AM    Final        Scheduled Meds: . arformoterol  15 mcg Nebulization BID  . aspirin EC  81 mg Oral Daily  . budesonide (PULMICORT) nebulizer solution  0.5 mg Nebulization BID  . diltiazem  60 mg Oral BID  . enoxaparin (LOVENOX) injection  40 mg Subcutaneous Q24H  . feeding supplement  237 mL Oral BID BM  . hydroxyurea  500 mg Oral QODAY  . levothyroxine  88 mcg Oral QAC breakfast  . methylPREDNISolone (SOLU-MEDROL) injection  60 mg Intravenous Q6H  . multivitamin with minerals  1 tablet Oral Daily  . revefenacin  175 mcg Nebulization Daily  . rosuvastatin  40 mg Oral Daily   Continuous Infusions: . sodium chloride 100 mL/hr at 08/24/20 0959     LOS: 3 days      Debbe Odea, MD [facility] Pager: [email or web address] 08/24/2020, 2:12 PM

## 2020-08-24 NOTE — Progress Notes (Signed)
NAME:  Cheryl Haley, MRN:  [id number], DOB:  1956-08-18, LOS: 3 ADMISSION DATE:  08/21/2020, CONSULTATION DATE:  08/22/2020 REFERRING MD:  Dr. Butler Denmark, [facility], CHIEF COMPLAINT:  Short of breath   Brief History:  64 yo female, former smoker, with chronic hypoxic (2L) resp failure who presented with progressive dyspnea and weakness.  She had recent hospitalization at [facility] for COPD exacerbation (her only one in several years).  She had persistent dyspnea and fever (temp 101).  She is followed in pulmonary office by Dr. Kendrick Fries and to establish with Dr. Francine Graven.  She developed fever, decreased appetite, fatigue and weakness earlier this month.  She was admitted to [facility] and treated for COPD exacerbation.  Several days after discharge she developed recurrence of her symptoms.  She also thinks she has lost about 10 lbs.  She has felt her heart rate going fast.  She quit smoking 3-4 years ago.  No recent travel history or sick exposures.  No animal/bird exposures.  Past Medical History:  HTN, PNA, CVA, HLD, COPD with emphysema, Chronic hypoxic respiratory failure, Hypothyroidism, Hiatal hernia, Essential thrombocythemia, OA, Anxiety, Influenza A January 2015  Significant Hospital Events:  12/21 Admit 12/22 PCCM consulted   Consults:    Procedures:    Significant Diagnostic Tests:   Spirometry 12/26/13 >> FEV1 0.8 (33%), FEV1% 41  CT angio chest 08/22/20 >> no acute process, negative for PE, bilateral pulmonary nodules, severe background emphysema  Micro Data:  COVID/flu 12/21 >> negative Blood 12/21 >> GPC 1/2 >>  BCID 12/21 >> strep species detected Blood 12/22 >> RVP 12/22 >> negative   Antimicrobials:  Vancomycin 12/21 Flagyl 12/21  Cefepime 12/21 >>   Interim History / Subjective:  Pt reports feeling better than on admission but not back to baseline  On 2L  Objective   BP 123/65 (BP Location: Right Arm)   Pulse (!) 107   Temp 98.4 F (36.9 C) (Oral)   Resp  18   Wt 49.6 kg   SpO2 95%   BMI 18.76 kg/m         Intake/Output Summary (Last 24 hours) at 08/24/2020 7680 Last data filed at 08/24/2020 0859 Gross per 24 hour  Intake 600 ml  Output 3150 ml  Net -2550 ml   Filed Weights   08/22/20 0228 08/23/20 0259 08/24/20 0155  Weight: 49.3 kg 50 kg 49.6 kg    Examination: General: Frail cachectic female no acute distress HEENT: MM pink/moist, no JVD no lymphadenopathy Neuro: No focal defect CV: Heart sounds are distant PULM: Decreased air movement throughout GI: soft, bsx4 active  Extremities: warm/dry, negative edema  Skin: no rashes or lesions    Discussion:  64 yo female with recurrent fever, dyspnea, fatigue, and myalgia.  She was also found to have hypovolemia, hyponatremia, and tachycardia.  Reports recent weight loss. 08/24/2020 much improved with steroids oxygen therapy and diuresis.  Assessment & Plan:   Fever Not currently on antimicrobial therapy Continue to monitor  Severe COPD with Emphysema. CT review shows severe background emphysema. She does not have frequent flares or hospitalizations. She has been admitted 2x in the last 3 years (both in recent month).  Baseline functional / able to perform ADLs.  08/24/2020 much improved  Continue Brovana Pulmicort. As needed albuterol Pulmonary toilet Outpatient follow-up with Dr. Francine Graven Consider pulmonary rehab Pulmonary critical care see again on Monday if needed  Acute on chronic hypoxic respiratory failure. Oxygen to keep sats greater than 90%  Essential thrombocythemia.  JAK2 [id number] Follow-up with outpatient with [facility]  Hypovolemic hyponatremia. Recent Labs  Lab 08/21/20 1406 08/22/20 0038  NA 126* 133*    Monitor sodium Normalizing Per primary  Pulmonary cachexia with BMI 18.64. Nutrition  Best practice:  Diet: heart healthy DVT prophylaxis: lovenox GI prophylaxis: not indicated Mobility: as tolerated  Labs    CMP Latest  Ref Rng & Units 08/22/2020 08/21/2020 07/12/2020  Glucose 70 - 99 mg/dL 112(H) 157(H) -  BUN 8 - 23 mg/dL 13 21 -  Creatinine 0.44 - 1.00 mg/dL 0.74 0.93 -  Sodium 135 - 145 mmol/L 133(L) 126(L) -  Potassium 3.5 - 5.1 mmol/L 3.3(L) 3.1(L) -  Chloride 98 - 111 mmol/L 96(L) 85(L) -  CO2 22 - 32 mmol/L 27 29 -  Calcium 8.9 - 10.3 mg/dL 7.9(L) 8.3(L) -  Total Protein 6.5 - 8.1 g/dL 5.6(L) 6.4(L) -  Total Bilirubin 0.3 - 1.2 mg/dL 0.7 0.7 -  Alkaline Phos 38 - 126 U/L 53 66 -  AST 15 - 41 U/L 26 28 -  ALT 0 - 44 U/L 20 23 25     CBC Latest Ref Rng & Units 08/22/2020 08/21/2020 05/04/2020  WBC 4.0 - 10.5 K/uL 4.1 5.7 4.8  Hemoglobin 12.0 - 15.0 g/dL 10.3(L) 12.2 11.1(L)  Hematocrit 36.0 - 46.0 % 30.3(L) 35.6(L) 33.5(L)  Platelets 150 - 400 K/uL 136(L) 191 269    ABG    Component Value Date/Time   HCO3 32.2 (H) 09/15/2018 1950   O2SAT 80.2 09/15/2018 1950    Lab Results  Component Value Date   TSH 0.372 08/22/2020    Signature:   Richardson Landry Jeniel Slauson ACNP Acute Care Nurse Practitioner [place] Please consult Amion 08/24/2020, 9:29 AM

## 2020-08-24 NOTE — Progress Notes (Signed)
Physical Therapy Treatment Patient Details Name: Cheryl Haley MRN: [id number] DOB: 11-23-1955 Today's Date: 08/24/2020    History of Present Illness 64 y.o. year-old w/ hx of tobacco abuse, PNA, HTN, HL, hx CVA/ TIA, COPD/ emphysema brought to ED by EMS for eval of gen'd weakness, SOB and cough.    PT Comments    Pt received in bed, anxious regarding elevated HR. HR 109 on arrival at rest in bed. Pt agreeable to in room mobility. Supervision transfers and min guard assist ambulation 30' with rollator. Pt on 3L continuous O2. SpO2 93% at rest and desat to 90% during mobility. 3/4 DOE with minimal activity. Max HR 131. Pt assist to/from [facility] prior to return to supine in bed. Pt grateful for therapy intervention.    Follow Up Recommendations  Home health PT;Supervision - Intermittent     Equipment Recommendations   (rollator)    Recommendations for Other Services       Precautions / Restrictions Precautions Precautions: Fall;Other (comment) Precaution Comments: monitor SpO2    Mobility  Bed Mobility Overal bed mobility: Modified Independent             General bed mobility comments: HOB elevated, +rail  Transfers Overall transfer level: Needs assistance Equipment used: Ambulation equipment used Transfers: Sit to/from [employer] Transfers   Stand pivot transfers: Supervision       General transfer comment: supervision for safety/lines  Ambulation/Gait Ambulation/Gait assistance: Min guard Gait Distance (Feet): 30 Feet Assistive device: 4-wheeled walker Gait Pattern/deviations: Step-through pattern;Decreased stride length Gait velocity: decreased Gait velocity interpretation: <1.31 ft/sec, indicative of household ambulator General Gait Details: min guard assist for safety, cues for rollator management   Stairs             Wheelchair Mobility    Modified Rankin (Stroke Patients Only)       Balance Overall balance assessment: Needs  assistance Sitting-balance support: No upper extremity supported;Feet supported Sitting balance-Leahy Scale: Good     Standing balance support: Bilateral upper extremity supported;No upper extremity supported;During functional activity Standing balance-Leahy Scale: Fair Standing balance comment: static stand without support, rollator for amb                            Cognition Arousal/Alertness: Awake/alert Behavior During Therapy: WFL for tasks assessed/performed Overall Cognitive Status: Within Functional Limits for tasks assessed                                 General Comments: mildly anxious      Exercises      General Comments General comments (skin integrity, edema, etc.): SpO2 93% at rest on 3L. Desat to 90% during mobility. 3/4 DOE with minimal activity. Resting HR 109. Max HR 131.      Pertinent Vitals/Pain Pain Assessment: No/denies pain    Home Living                      Prior Function            PT Goals (current goals can now be found in the care plan section) Acute Rehab PT Goals Patient Stated Goal: breathe better    Frequency    Min 3X/week      PT Plan Current plan remains appropriate    Co-evaluation              AM-PAC PT "  6 Clicks" Mobility   Outcome Measure  Help needed turning from your back to your side while in a flat bed without using bedrails?: None Help needed moving from lying on your back to sitting on the side of a flat bed without using bedrails?: None Help needed moving to and from a bed to a chair (including a wheelchair)?: A Little Help needed standing up from a chair using your arms (e.g., wheelchair or bedside chair)?: A Little Help needed to walk in hospital room?: A Little Help needed climbing 3-5 steps with a railing? : A Lot 6 Click Score: 19    End of Session Equipment Utilized During Treatment: Oxygen Activity Tolerance: Patient tolerated treatment well Patient left:  with call bell/phone within reach Nurse Communication: Mobility status PT Visit Diagnosis: Other abnormalities of gait and mobility (R26.89);Muscle weakness (generalized) (M62.81);Other (comment)     Time: [id number] PT Time Calculation (min) (ACUTE ONLY): 30 min  Charges:  $Gait Training: 23-37 mins                     Lorrin Goodell, [place]  Office # [phone] Pager [phone]    Lorriane Shire 08/24/2020, 12:22 PM

## 2020-08-24 NOTE — Progress Notes (Signed)
Echocardiogram 2D Echocardiogram has been performed.  Cheryl Haley 08/24/2020, 9:56 AM

## 2020-08-25 DIAGNOSIS — J9622 Acute and chronic respiratory failure with hypercapnia: Secondary | ICD-10-CM | POA: Diagnosis not present

## 2020-08-25 DIAGNOSIS — J9621 Acute and chronic respiratory failure with hypoxia: Secondary | ICD-10-CM | POA: Diagnosis not present

## 2020-08-25 LAB — MAGNESIUM: Magnesium: 2.1 mg/dL (ref 1.7–2.4)

## 2020-08-25 MED ORDER — DIPHENHYDRAMINE HCL 25 MG PO CAPS
25.0000 mg | ORAL_CAPSULE | Freq: Every evening | ORAL | Status: DC | PRN
  Administered 2020-08-25 – 2020-08-27 (×3): 25 mg via ORAL
  Filled 2020-08-25 (×3): qty 1

## 2020-08-25 NOTE — Progress Notes (Signed)
Patient ambulate from the room to the nursing station, pt. has SOB, denies pain oxygen saturation drop to 87%/3L. Will continue to monitor

## 2020-08-25 NOTE — Progress Notes (Signed)
NAME:  Cheryl Haley, MRN:  [id number], DOB:  Mar 03, 1956, LOS: 4 ADMISSION DATE:  08/21/2020, CONSULTATION DATE:  08/22/2020 REFERRING MD:  Dr. Wynelle Cleveland, [facility], CHIEF COMPLAINT:  Short of breath   Brief History:  64 yo female, former smoker, with chronic hypoxic (2L) resp failure who presented with progressive dyspnea and weakness.  She had recent hospitalization at [facility] for COPD exacerbation (her only one in several years).  She had persistent dyspnea and fever (temp 101).  She is followed in pulmonary office by [name] and to establish with [name].  She developed fever, decreased appetite, fatigue and weakness earlier this month.  She was admitted to [facility] and treated for COPD exacerbation.  Several days after discharge she developed recurrence of her symptoms.  She also thinks she has lost about 10 lbs.  She has felt her heart rate going fast.  She quit smoking 3-4 years ago.  No recent travel history or sick exposures.  No animal/bird exposures.  Past Medical History:  HTN, PNA, CVA, HLD, COPD with emphysema, Chronic hypoxic respiratory failure, Hypothyroidism, Hiatal hernia, Essential thrombocythemia, OA, Anxiety, Influenza A January 2015  Significant Hospital Events:  12/21 Admit 12/22 PCCM consulted  12/23  - start solumedrol 12/24 - Pt reports feeling better than on admission but not back to baseline .On 2L   Consults:    Procedures:    Significant Diagnostic Tests:   Spirometry 12/26/13 >> FEV1 0.8 (33%), FEV1% 41  CT angio chest 08/22/20 >> no acute process, negative for PE, bilateral pulmonary nodules, severe background emphysema  Micro Data:  COVID/flu 12/21 >> negative Blood 12/21 >> GPC 1/2 >>  BCID 12/21 >> strep species detected Blood 12/22 >> RVP 12/22 >> negative   Antimicrobials:   Anti-infectives (From admission, onward)   Start     Dose/Rate Route Frequency Ordered Stop   08/22/20 2300  cefTRIAXone (ROCEPHIN) 2 g in sodium chloride  0.9 % 100 mL IVPB  Status:  Discontinued        2 g 200 mL/hr over 30 Minutes Intravenous Every 24 hours 08/22/20 2248 08/23/20 1631   08/22/20 1230  oseltamivir (TAMIFLU) capsule 30 mg  Status:  Discontinued        30 mg Oral 2 times daily 08/22/20 1142 08/22/20 1200   08/22/20 0500  vancomycin (VANCOREADY) IVPB 500 mg/100 mL  Status:  Discontinued        500 mg 100 mL/hr over 60 Minutes Intravenous Every 12 hours 08/21/20 1659 08/22/20 1142   08/22/20 0000  ceFEPIme (MAXIPIME) 2 g in sodium chloride 0.9 % 100 mL IVPB  Status:  Discontinued        2 g 200 mL/hr over 30 Minutes Intravenous Every 8 hours 08/21/20 1659 08/22/20 1142   08/21/20 1600  ceFEPIme (MAXIPIME) 2 g in sodium chloride 0.9 % 100 mL IVPB        2 g 200 mL/hr over 30 Minutes Intravenous  Once 08/21/20 1557 08/21/20 1659   08/21/20 1600  vancomycin (VANCOCIN) IVPB 1000 mg/200 mL premix        1,000 mg 200 mL/hr over 60 Minutes Intravenous  Once 08/21/20 1557 08/21/20 1811   08/21/20 1600  metroNIDAZOLE (FLAGYL) tablet 500 mg  Status:  Discontinued        500 mg Oral Every 8 hours 08/21/20 1557 08/22/20 1150       Interim History / Subjective:   08/25/2020 -ABG without hypercapnia.  Apprehensive about using BiPAP.  I think  she use CPAP for few hours last night.  Subjective improvement not clear.  This morning she tells me that even though she is better since the time we started Solu-Medrol 2 days ago she does not think she is as good as yesterday.  She complains of some dyspnea on exertion.  She is willing to try noninvasive positive pressure ventilation again tonight.  She tells me that hospitalist has agreed to give her some Tylenol PM to help her sleep better and then she would use the mask.   She is afebrile since 08/22/2020 Objective   BP 114/66 (BP Location: Right Arm)   Pulse 100   Temp (!) 97.5 F (36.4 C) (Oral)   Resp 18   Wt 51.4 kg   SpO2 97%   BMI 19.47 kg/m         Intake/Output Summary (Last  24 hours) at 08/25/2020 1337 Last data filed at 08/25/2020 0949 Gross per 24 hour  Intake 720 ml  Output 1550 ml  Net -830 ml   Filed Weights   08/23/20 0259 08/24/20 0155 08/25/20 0122  Weight: 50 kg 49.6 kg 51.4 kg   Exam Alert and oriented x3.  Speech is normal.  Barrel chested cachectic female.  Sitting in the chair.  She has a sister sitting in her bed.  She is using oxygen.  She is able to lean back.  She looks better than 2 days ago when I first met her.  She looks similar to yesterday.  No wheezing which is an improvement from 2 days ago and similar to yesterday.  Normal heart sounds no sinus no clubbing no edema.  Abdomen soft normal heart sounds  Discussion:  64 yo female with recurrent fever, dyspnea, fatigue, and myalgia.  She was also found to have hypovolemia, hyponatremia, and tachycardia.  Reports recent weight loss. 08/24/2020 much improved with steroids oxygen therapy and diuresis.  Assessment & Plan:   #Baseline -Advanced COPD with chronic hypoxemic respiratory failure associated with severe cachexia = Check x-ray is very much due to COPD  #Current -Nonviral acute exacerbation COPD recurrence  08/25/2020: She is better but might have plateaued in the last 24 hours.    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  Anecdotally have several patients like this with COPD who have been doing well without awareness of COPD and then have an exacerbation and then there had deterioration of their functional status in the dependent on oxygen and the cachexia is revealed/more apparent.   Plan -Prognosis: I have discussed with her and her sister that she is unlikely to return to full functional baseline status as of 1 or 2 months ago.  However progressive improvement over time slowly and steadily is possible but never returning to full functional baseline state.  -To achieve improvement in  functional status recommend  -Inpatient rehabilitation versus long-term acute care [she is somewhat reluctant to this idea but is willing to hear her options.  She might be more open to home physical therapy]  --Scheduled nebulization with oxygen to continue  -Given normal CO2 she does not qualify for home BiPAP therapy but okay to do noninvasive positive pressure ventilation at night for a few days to aid her recovery  -I will check magnesium and phosphorus 08/26/2020 to to see if electrolytes can be contributing to her fatigue  -Continue Solu-Medrol through 08/26/2020 and then start prednisone and with 3-week  taper [I did tell her that she might ultimately end up being classified as prednisone dependent and might require low-dose prednisone but at this stage I do want to see if she can be without prednisone after treatment of the current exacerbation]  -Outpatient she will need Roflumilast to prevent COPD exacerbations  -= We will check alpha-1 antitrypsin phenotype    #Anemia  Plan  - PRBC for hgb </= 6.9gm%    - exceptions are   -  if ACS susepcted/confirmed then transfuse for hgb </= 8.0gm%,  or    -  active bleeding with hemodynamic instability, then transfuse regardless of hemoglobin value   At at all times try to transfuse 1 unit prbc as possible with exception of active hemorrhage    Best practice:  According to the hospitalist       SIGNATURE    Dr. Brand Males, M.D., F.C.C.P,  Pulmonary and Critical Care Medicine Staff Physician, [place] Director - Interstitial Lung Disease  Program  Pulmonary [place] at [place], [place], [address]  Pager: [phone], If no answer  OR between  19:00-7:00h: page [phone] Telephone (clinical office): [phone] Telephone (research): [phone]  1:37 PM 08/25/2020    LABS    PULMONARY Recent Labs  Lab 08/24/20 1638  PHART 7.425  PCO2ART  38.6  PO2ART 74.0*  HCO3 25.0  O2SAT 95.9    CBC Recent Labs  Lab 08/21/20 1406 08/22/20 0038  HGB 12.2 10.3*  HCT 35.6* 30.3*  WBC 5.7 4.1  PLT 191 136*    COAGULATION No results for input(s): INR in the last 168 hours.  CARDIAC  No results for input(s): TROPONINI in the last 168 hours. No results for input(s): PROBNP in the last 168 hours.   CHEMISTRY Recent Labs  Lab 08/21/20 1406 08/22/20 0038 08/25/20 0236  NA 126* 133*  --   K 3.1* 3.3*  --   CL 85* 96*  --   CO2 29 27  --   GLUCOSE 157* 112*  --   BUN 21 13  --   CREATININE 0.93 0.74  --   CALCIUM 8.3* 7.9*  --   MG  --  1.9 2.1   Estimated Creatinine Clearance: 56.1 mL/min (by C-G formula based on SCr of 0.74 mg/dL).   LIVER Recent Labs  Lab 08/21/20 1406 08/22/20 0038  AST 28 26  ALT 23 20  ALKPHOS 66 53  BILITOT 0.7 0.7  PROT 6.4* 5.6*  ALBUMIN 3.0* 2.6*     INFECTIOUS Recent Labs  Lab 08/21/20 1406 08/22/20 0038  LATICACIDVEN 1.1 1.1     ENDOCRINE CBG (last 3)  No results for input(s): GLUCAP in the last 72 hours.       IMAGING x48h  - image(s) personally visualized  -   highlighted in bold ECHOCARDIOGRAM COMPLETE  Result Date: 08/24/2020    ECHOCARDIOGRAM REPORT   Patient Name:   TERESITA FANTON Date of Exam: 08/24/2020 Medical Rec #:  [id number]       Height:       64.0 in Accession #:    [id number]      Weight:       109.3 lb Date of Birth:  [birth year]      BSA:          1.513 m Patient Age:    45 years        BP:  123/65 mmHg Patient Gender: F               HR:           117 bpm. Exam Location:  Inpatient Procedure: 2D Echo, Color Doppler and Cardiac Doppler Indications:    Acute Respiratory Distress R06.03  History:        Patient has no prior history of Echocardiogram examinations.                 COPD; Risk Factors:Hypertension, Dyslipidemia and Former Smoker.  Sonographer:    Raquel Sarna Senior RDCS Referring Phys: [place]  Sonographer Comments:  Scanned upright due to dyspnea. IMPRESSIONS  1. Left ventricular ejection fraction, by estimation, is 55 to 60%. The left ventricle has normal function. The left ventricle has no regional wall motion abnormalities. Left ventricular diastolic parameters are indeterminate.  2. Right ventricular systolic function is normal. The right ventricular size is normal.  3. The mitral valve is normal in structure. No evidence of mitral valve regurgitation. No evidence of mitral stenosis. Moderate mitral annular calcification.  4. The aortic valve was not well visualized. There is mild calcification of the aortic valve. There is mild thickening of the aortic valve. Aortic valve regurgitation is not visualized. No aortic stenosis is present.  5. The inferior vena cava is normal in size with greater than 50% respiratory variability, suggesting right atrial pressure of 3 mmHg. FINDINGS  Left Ventricle: Left ventricular ejection fraction, by estimation, is 55 to 60%. The left ventricle has normal function. The left ventricle has no regional wall motion abnormalities. The left ventricular internal cavity size was normal in size. There is  no left ventricular hypertrophy. Left ventricular diastolic parameters are indeterminate. Right Ventricle: The right ventricular size is normal. No increase in right ventricular wall thickness. Right ventricular systolic function is normal. Left Atrium: Left atrial size was normal in size. Right Atrium: Right atrial size was normal in size. Pericardium: There is no evidence of pericardial effusion. Mitral Valve: The mitral valve is normal in structure. There is mild thickening of the mitral valve leaflet(s). There is mild calcification of the mitral valve leaflet(s). Moderate mitral annular calcification. No evidence of mitral valve regurgitation. No evidence of mitral valve stenosis. Tricuspid Valve: The tricuspid valve is normal in structure. Tricuspid valve regurgitation is not demonstrated. No  evidence of tricuspid stenosis. Aortic Valve: The aortic valve was not well visualized. There is mild calcification of the aortic valve. There is mild thickening of the aortic valve. There is mild aortic valve annular calcification. Aortic valve regurgitation is not visualized. No aortic stenosis is present. Aortic valve mean gradient measures 4.6 mmHg. Aortic valve peak gradient measures 8.9 mmHg. Aortic valve area, by VTI measures 3.45 cm. Pulmonic Valve: The pulmonic valve was not well visualized. Pulmonic valve regurgitation is not visualized. No evidence of pulmonic stenosis. Aorta: The aortic root is normal in size and structure. Pulmonary Artery: Indeterminant PASP, inadequate TR jet. Venous: The inferior vena cava is normal in size with greater than 50% respiratory variability, suggesting right atrial pressure of 3 mmHg. IAS/Shunts: No atrial level shunt detected by color flow Doppler.  LEFT VENTRICLE PLAX 2D LVIDd:         3.80 cm LVIDs:         2.70 cm LV PW:         1.00 cm LV IVS:        0.90 cm LVOT diam:     1.90 cm  LV SV:         85 LV SV Index:   56 LVOT Area:     2.84 cm  RIGHT VENTRICLE RV S prime:     14.00 cm/s TAPSE (M-mode): 2.0 cm LEFT ATRIUM             Index       RIGHT ATRIUM           Index LA diam:        3.20 cm 2.11 cm/m  RA Area:     10.50 cm LA Vol (A2C):   43.7 ml 28.88 ml/m RA Volume:   22.60 ml  14.93 ml/m LA Vol (A4C):   18.7 ml 12.36 ml/m LA Biplane Vol: 30.8 ml 20.35 ml/m  AORTIC VALVE AV Area (Vmax):    2.60 cm AV Area (Vmean):   2.96 cm AV Area (VTI):     3.45 cm AV Vmax:           149.31 cm/s AV Vmean:          100.407 cm/s AV VTI:            0.247 m AV Peak Grad:      8.9 mmHg AV Mean Grad:      4.6 mmHg LVOT Vmax:         137.00 cm/s LVOT Vmean:        105.000 cm/s LVOT VTI:          0.301 m LVOT/AV VTI ratio: 1.22  AORTA Ao Root diam: 2.70 cm Ao Asc diam:  3.10 cm  SHUNTS Systemic VTI:  0.30 m Systemic Diam: 1.90 cm Carlyle Dolly MD Electronically signed by  Carlyle Dolly MD Signature Date/Time: 08/24/2020/10:51:51 AM    Final

## 2020-08-25 NOTE — Progress Notes (Signed)
PROGRESS NOTE    Cheryl Haley   [id number]  DOB: Mar 08, 1956  DOA: 08/21/2020 PCP: [name], MD   Brief Narrative:  Cheryl Haley is a 64 y.o. year-old w/ hx of tobacco abuse, PNA, HTN, HL, hx CVA/ TIA, COPD/ emphysema brought to ED by EMS for eval of gen'd weakness, SOB and cough.  Seen at outside hospital recently and told may have COPD exacerbation, she rec'd Rx for prednisone for 10 days. She took the medication and was feeling a little better , but then again felt worsening dyspnea. She presented to the ED and was found to have a temp of 101.6. Given Vanc and Cefepime and admitted to the hospital.    Subjective: She states she remains short of breath today. She has not been able to sleep. She is upset because she feels she is not getting better.    Assessment & Plan:   Principal Problem: Acute on chronic respiratory failure with fever /   COPD (chronic obstructive pulmonary disease) with emphysema   - CT of the chest ordered to look for pneumonia and is unrevealing for an infection- No PE found either - appreciate pulmonary eval- She was started on Solumedrol on 12/23 - She tried to use the CPAP last night but could only tolerate it for 2 hrs.   Active Problems:  Sepsis-  due to bacteremia? Vs viral lung infection > blood culture 1/4 reveals Strep sanguinous started on Ceftriaxone on 12/22 - Temp 100.5 on 12/22 and has not recurred since - ID feels it may be a contaminant and Ceftriaxone was stopped on 12/23- following for fevers- none so far   Sinus tachycardia  -home Cardizem and Toprol were on hold for low BPs- Cardizem resumed   Hypovolemia - Improved with IVF which have been stopped   Cachexia (? Related to COPD) - add dietary supplements- nutrition consulted   Hypokalemia - replaced - follow     Hypothyroidism -cont Synthroid    Tobacco abuse       Essential thrombocythemia - cont Hydroxyurea     Time spent in minutes: 35 DVT  prophylaxis: enoxaparin (LOVENOX) injection 40 mg Start: 08/21/20 2200  Code Status: Full code Family Communication:  Disposition Plan:  Status is: Inpatient  Remains inpatient appropriate because:Ongoing diagnostic testing needed not appropriate for outpatient work up   Dispo: The patient is from: Home              Anticipated d/c is to: Home              Anticipated d/c date is: 1 day              Patient currently is not medically stable to d/c.   Consultants:   pulmonary Procedures:    Antimicrobials:  Anti-infectives (From admission, onward)   Start     Dose/Rate Route Frequency Ordered Stop   08/22/20 2300  cefTRIAXone (ROCEPHIN) 2 g in sodium chloride 0.9 % 100 mL IVPB  Status:  Discontinued        2 g 200 mL/hr over 30 Minutes Intravenous Every 24 hours 08/22/20 2248 08/23/20 1631   08/22/20 1230  oseltamivir (TAMIFLU) capsule 30 mg  Status:  Discontinued        30 mg Oral 2 times daily 08/22/20 1142 08/22/20 1200   08/22/20 0500  vancomycin (VANCOREADY) IVPB 500 mg/100 mL  Status:  Discontinued        500 mg 100 mL/hr over 60 Minutes Intravenous Every 12  hours 08/21/20 1659 08/22/20 1142   08/22/20 0000  ceFEPIme (MAXIPIME) 2 g in sodium chloride 0.9 % 100 mL IVPB  Status:  Discontinued        2 g 200 mL/hr over 30 Minutes Intravenous Every 8 hours 08/21/20 1659 08/22/20 1142   08/21/20 1600  ceFEPIme (MAXIPIME) 2 g in sodium chloride 0.9 % 100 mL IVPB        2 g 200 mL/hr over 30 Minutes Intravenous  Once 08/21/20 1557 08/21/20 1659   08/21/20 1600  vancomycin (VANCOCIN) IVPB 1000 mg/200 mL premix        1,000 mg 200 mL/hr over 60 Minutes Intravenous  Once 08/21/20 1557 08/21/20 1811   08/21/20 1600  metroNIDAZOLE (FLAGYL) tablet 500 mg  Status:  Discontinued        500 mg Oral Every 8 hours 08/21/20 1557 08/22/20 1150       Objective: Vitals:   08/25/20 0500 08/25/20 0750 08/25/20 1006 08/25/20 1226  BP: 122/67  (!) 154/77 114/66  Pulse: 92  (!) 105 100   Resp: 20 18 20 18   Temp: 98.2 F (36.8 C)  98.7 F (37.1 C) (!) 97.5 F (36.4 C)  TempSrc: Oral  Oral Oral  SpO2: 97%  96% 97%  Weight:        Intake/Output Summary (Last 24 hours) at 08/25/2020 1248 Last data filed at 08/25/2020 0949 Gross per 24 hour  Intake 720 ml  Output 1550 ml  Net -830 ml   Filed Weights   08/23/20 0259 08/24/20 0155 08/25/20 0122  Weight: 50 kg 49.6 kg 51.4 kg    Examination: General exam: Appears comfortable  HEENT: PERRLA, oral mucosa moist, no sclera icterus or thrush Respiratory system: Clear to auscultation. Respiratory effort normal. Cardiovascular system: S1 & S2 heard,  No murmurs  Gastrointestinal system: Abdomen soft, non-tender, nondistended. Normal bowel sounds   Central nervous system: Alert and oriented. No focal neurological deficits. Extremities: No cyanosis, clubbing or edema Skin: No rashes or ulcers Psychiatry:  Mood & affect appropriate.    Data Reviewed: I have personally reviewed following labs and imaging studies  CBC: Recent Labs  Lab 08/21/20 1406 08/22/20 0038  WBC 5.7 4.1  NEUTROABS 4.7  --   HGB 12.2 10.3*  HCT 35.6* 30.3*  MCV 94.7 94.7  PLT 191 [id number]*   Basic Metabolic Panel: Recent Labs  Lab 08/21/20 1406 08/22/20 0038 08/25/20 0236  NA 126* 133*  --   K 3.1* 3.3*  --   CL 85* 96*  --   CO2 29 27  --   GLUCOSE 157* 112*  --   BUN 21 13  --   CREATININE 0.93 0.74  --   CALCIUM 8.3* 7.9*  --   MG  --  1.9 2.1   GFR: Estimated Creatinine Clearance: 56.1 mL/min (by C-G formula based on SCr of 0.74 mg/dL). Liver Function Tests: Recent Labs  Lab 08/21/20 1406 08/22/20 0038  AST 28 26  ALT 23 20  ALKPHOS 66 53  BILITOT 0.7 0.7  PROT 6.4* 5.6*  ALBUMIN 3.0* 2.6*   No results for input(s): LIPASE, AMYLASE in the last 168 hours. No results for input(s): AMMONIA in the last 168 hours. Coagulation Profile: No results for input(s): INR, PROTIME in the last 168 hours. Cardiac Enzymes: No  results for input(s): CKTOTAL, CKMB, CKMBINDEX, TROPONINI in the last 168 hours. BNP (last 3 results) No results for input(s): PROBNP in the last 8760 hours. HbA1C: No results  for input(s): HGBA1C in the last 72 hours. CBG: No results for input(s): GLUCAP in the last 168 hours. Lipid Profile: No results for input(s): CHOL, HDL, LDLCALC, TRIG, CHOLHDL, LDLDIRECT in the last 72 hours. Thyroid Function Tests: Recent Labs    08/22/20 1606  TSH 0.372   Anemia Panel: No results for input(s): VITAMINB12, FOLATE, FERRITIN, TIBC, IRON, RETICCTPCT in the last 72 hours. Urine analysis:    Component Value Date/Time   COLORURINE YELLOW 08/21/2020 1822   APPEARANCEUR HAZY (A) 08/21/2020 1822   LABSPEC 1.013 08/21/2020 1822   PHURINE 6.0 08/21/2020 1822   GLUCOSEU 150 (A) 08/21/2020 1822   HGBUR SMALL (A) 08/21/2020 1822   BILIRUBINUR NEGATIVE 08/21/2020 1822   KETONESUR 5 (A) 08/21/2020 1822   PROTEINUR 100 (A) 08/21/2020 1822   UROBILINOGEN 1.0 09/20/2013 1621   NITRITE NEGATIVE 08/21/2020 1822   LEUKOCYTESUR NEGATIVE 08/21/2020 1822   Sepsis Labs: @LABRCNTIP (procalcitonin:4,lacticidven:4) ) Recent Results (from the past 240 hour(s))  Resp Panel by RT-PCR (Flu A&B, Covid) Nasopharyngeal Swab     Status: None   Collection Time: 08/21/20  2:07 PM   Specimen: Nasopharyngeal Swab; Nasopharyngeal(NP) swabs in vial transport medium  Result Value Ref Range Status   SARS Coronavirus 2 by RT PCR NEGATIVE NEGATIVE Final    Comment: (NOTE) SARS-CoV-2 target nucleic acids are NOT DETECTED.  The SARS-CoV-2 RNA is generally detectable in upper respiratory specimens during the acute phase of infection. The lowest concentration of SARS-CoV-2 viral copies this assay can detect is 138 copies/mL. A negative result does not preclude SARS-Cov-2 infection and should not be used as the sole basis for treatment or other patient management decisions. A negative result may occur with  improper specimen  collection/handling, submission of specimen other than nasopharyngeal swab, presence of viral mutation(s) within the areas targeted by this assay, and inadequate number of viral copies(<138 copies/mL). A negative result must be combined with clinical observations, patient history, and epidemiological information. The expected result is Negative.  Fact Sheet for Patients:  EntrepreneurPulse.com.au  Fact Sheet for Healthcare Providers:  IncredibleEmployment.be  This test is no t yet approved or cleared by the [place] FDA and  has been authorized for detection and/or diagnosis of SARS-CoV-2 by FDA under an Emergency Use Authorization (EUA). This EUA will remain  in effect (meaning this test can be used) for the duration of the COVID-19 declaration under Section 564(b)(1) of the Act, 21 U.S.C.section 360bbb-3(b)(1), unless the authorization is terminated  or revoked sooner.       Influenza A by PCR NEGATIVE NEGATIVE Final   Influenza B by PCR NEGATIVE NEGATIVE Final    Comment: (NOTE) The Xpert Xpress SARS-CoV-2/FLU/RSV plus assay is intended as an aid in the diagnosis of influenza from Nasopharyngeal swab specimens and should not be used as a sole basis for treatment. Nasal washings and aspirates are unacceptable for Xpert Xpress SARS-CoV-2/FLU/RSV testing.  Fact Sheet for Patients: EntrepreneurPulse.com.au  Fact Sheet for Healthcare Providers: IncredibleEmployment.be  This test is not yet approved or cleared by the [place] FDA and has been authorized for detection and/or diagnosis of SARS-CoV-2 by FDA under an Emergency Use Authorization (EUA). This EUA will remain in effect (meaning this test can be used) for the duration of the COVID-19 declaration under Section 564(b)(1) of the Act, 21 U.S.C. section 360bbb-3(b)(1), unless the authorization is terminated or revoked.  Performed at [facility], [place] [address], [place], [place] [address]   Blood Culture (routine x 2)  Status: Abnormal   Collection Time: 08/21/20  4:23 PM   Specimen: BLOOD  Result Value Ref Range Status   Specimen Description BLOOD SITE NOT SPECIFIED  Final   Special Requests   Final    BOTTLES DRAWN AEROBIC AND ANAEROBIC Blood Culture adequate volume   Culture  Setup Time   Final    AEROBIC BOTTLE ONLY GRAM POSITIVE COCCI Organism ID to follow CRITICAL RESULT CALLED TO, READ BACK BY AND VERIFIED WITH: T DANG PHARMD 08/22/20 2236 JDW    Culture (A)  Final    STREPTOCOCCUS SANGUINIS THE SIGNIFICANCE OF ISOLATING THIS ORGANISM FROM A SINGLE SET OF BLOOD CULTURES WHEN MULTIPLE SETS ARE DRAWN IS UNCERTAIN. PLEASE NOTIFY THE MICROBIOLOGY DEPARTMENT WITHIN ONE WEEK IF SPECIATION AND SENSITIVITIES ARE REQUIRED. Performed at [facility], [place] [address], [place], [place] [address]    Report Status 08/24/2020 FINAL  Final  Blood Culture ID Panel (Reflexed)     Status: Abnormal   Collection Time: 08/21/20  4:23 PM  Result Value Ref Range Status   Enterococcus faecalis NOT DETECTED NOT DETECTED Final   Enterococcus Faecium NOT DETECTED NOT DETECTED Final   Listeria monocytogenes NOT DETECTED NOT DETECTED Final   Staphylococcus species NOT DETECTED NOT DETECTED Final   Staphylococcus aureus (BCID) NOT DETECTED NOT DETECTED Final   Staphylococcus epidermidis NOT DETECTED NOT DETECTED Final   Staphylococcus lugdunensis NOT DETECTED NOT DETECTED Final   Streptococcus species DETECTED (A) NOT DETECTED Final    Comment: Not Enterococcus species, Streptococcus agalactiae, Streptococcus pyogenes, or Streptococcus pneumoniae. CRITICAL RESULT CALLED TO, READ BACK BY AND VERIFIED WITH: T DANG PHARMD 08/22/20 2236 JDW    Streptococcus agalactiae NOT DETECTED NOT DETECTED Final   Streptococcus pneumoniae NOT DETECTED NOT DETECTED Final   Streptococcus pyogenes NOT DETECTED NOT DETECTED Final    A.calcoaceticus-baumannii NOT DETECTED NOT DETECTED Final   Bacteroides fragilis NOT DETECTED NOT DETECTED Final   Enterobacterales NOT DETECTED NOT DETECTED Final   Enterobacter cloacae complex NOT DETECTED NOT DETECTED Final   Escherichia coli NOT DETECTED NOT DETECTED Final   Klebsiella aerogenes NOT DETECTED NOT DETECTED Final   Klebsiella oxytoca NOT DETECTED NOT DETECTED Final   Klebsiella pneumoniae NOT DETECTED NOT DETECTED Final   Proteus species NOT DETECTED NOT DETECTED Final   Salmonella species NOT DETECTED NOT DETECTED Final   Serratia marcescens NOT DETECTED NOT DETECTED Final   Haemophilus influenzae NOT DETECTED NOT DETECTED Final   Neisseria meningitidis NOT DETECTED NOT DETECTED Final   Pseudomonas aeruginosa NOT DETECTED NOT DETECTED Final   Stenotrophomonas maltophilia NOT DETECTED NOT DETECTED Final   Candida albicans NOT DETECTED NOT DETECTED Final   Candida auris NOT DETECTED NOT DETECTED Final   Candida glabrata NOT DETECTED NOT DETECTED Final   Candida krusei NOT DETECTED NOT DETECTED Final   Candida parapsilosis NOT DETECTED NOT DETECTED Final   Candida tropicalis NOT DETECTED NOT DETECTED Final   Cryptococcus neoformans/gattii NOT DETECTED NOT DETECTED Final    Comment: Performed at [facility], [address], [place], [place] [address]  Blood Culture (routine x 2)     Status: None (Preliminary result)   Collection Time: 08/22/20 12:38 AM   Specimen: BLOOD RIGHT HAND  Result Value Ref Range Status   Specimen Description BLOOD RIGHT HAND  Final   Special Requests   Final    BOTTLES DRAWN AEROBIC AND ANAEROBIC Blood Culture adequate volume   Culture   Final    NO GROWTH 3  DAYS Performed at [facility], [address], [place], [place] [address]    Report Status PENDING  Incomplete  Respiratory Panel by PCR     Status: None   Collection Time: 08/22/20  1:02 PM   Specimen: Nasopharyngeal Swab; Respiratory  Result Value Ref Range Status    Adenovirus NOT DETECTED NOT DETECTED Final   Coronavirus 229E NOT DETECTED NOT DETECTED Final    Comment: (NOTE) The Coronavirus on the Respiratory Panel, DOES NOT test for the novel  Coronavirus (2019 nCoV)    Coronavirus HKU1 NOT DETECTED NOT DETECTED Final   Coronavirus NL63 NOT DETECTED NOT DETECTED Final   Coronavirus OC43 NOT DETECTED NOT DETECTED Final   Metapneumovirus NOT DETECTED NOT DETECTED Final   Rhinovirus / Enterovirus NOT DETECTED NOT DETECTED Final   Influenza A NOT DETECTED NOT DETECTED Final   Influenza B NOT DETECTED NOT DETECTED Final   Parainfluenza Virus 1 NOT DETECTED NOT DETECTED Final   Parainfluenza Virus 2 NOT DETECTED NOT DETECTED Final   Parainfluenza Virus 3 NOT DETECTED NOT DETECTED Final   Parainfluenza Virus 4 NOT DETECTED NOT DETECTED Final   Respiratory Syncytial Virus NOT DETECTED NOT DETECTED Final   Bordetella pertussis NOT DETECTED NOT DETECTED Final   Bordetella Parapertussis NOT DETECTED NOT DETECTED Final   Chlamydophila pneumoniae NOT DETECTED NOT DETECTED Final   Mycoplasma pneumoniae NOT DETECTED NOT DETECTED Final    Comment: Performed at [facility], [address] [place], [place] [address]         Radiology Studies: ECHOCARDIOGRAM COMPLETE  Result Date: 08/24/2020    ECHOCARDIOGRAM REPORT   Patient Name:   Cheryl Haley Date of Exam: 08/24/2020 Medical Rec #:  [id number]       Height:       64.0 in Accession #:    [id number]      Weight:       109.3 lb Date of Birth:  [birth year]      BSA:          1.513 m Patient Age:    64 years        BP:           123/65 mmHg Patient Gender: F               HR:           117 bpm. Exam Location:  Inpatient Procedure: 2D Echo, Color Doppler and Cardiac Doppler Indications:    Acute Respiratory Distress R06.03  History:        Patient has no prior history of Echocardiogram examinations.                 COPD; Risk Factors:Hypertension, Dyslipidemia and Former Smoker.  Sonographer:    Irving Burton  Senior RDCS Referring Phys: [phone] [facility]  Sonographer Comments: Scanned upright due to dyspnea. IMPRESSIONS  1. Left ventricular ejection fraction, by estimation, is 55 to 60%. The left ventricle has normal function. The left ventricle has no regional wall motion abnormalities. Left ventricular diastolic parameters are indeterminate.  2. Right ventricular systolic function is normal. The right ventricular size is normal.  3. The mitral valve is normal in structure. No evidence of mitral valve regurgitation. No evidence of mitral stenosis. Moderate mitral annular calcification.  4. The aortic valve was not well visualized. There is mild calcification of the aortic valve. There is mild thickening of the aortic valve. Aortic valve regurgitation is not visualized. No aortic stenosis is  present.  5. The inferior vena cava is normal in size with greater than 50% respiratory variability, suggesting right atrial pressure of 3 mmHg. FINDINGS  Left Ventricle: Left ventricular ejection fraction, by estimation, is 55 to 60%. The left ventricle has normal function. The left ventricle has no regional wall motion abnormalities. The left ventricular internal cavity size was normal in size. There is  no left ventricular hypertrophy. Left ventricular diastolic parameters are indeterminate. Right Ventricle: The right ventricular size is normal. No increase in right ventricular wall thickness. Right ventricular systolic function is normal. Left Atrium: Left atrial size was normal in size. Right Atrium: Right atrial size was normal in size. Pericardium: There is no evidence of pericardial effusion. Mitral Valve: The mitral valve is normal in structure. There is mild thickening of the mitral valve leaflet(s). There is mild calcification of the mitral valve leaflet(s). Moderate mitral annular calcification. No evidence of mitral valve regurgitation. No evidence of mitral valve stenosis. Tricuspid Valve: The tricuspid valve is  normal in structure. Tricuspid valve regurgitation is not demonstrated. No evidence of tricuspid stenosis. Aortic Valve: The aortic valve was not well visualized. There is mild calcification of the aortic valve. There is mild thickening of the aortic valve. There is mild aortic valve annular calcification. Aortic valve regurgitation is not visualized. No aortic stenosis is present. Aortic valve mean gradient measures 4.6 mmHg. Aortic valve peak gradient measures 8.9 mmHg. Aortic valve area, by VTI measures 3.45 cm. Pulmonic Valve: The pulmonic valve was not well visualized. Pulmonic valve regurgitation is not visualized. No evidence of pulmonic stenosis. Aorta: The aortic root is normal in size and structure. Pulmonary Artery: Indeterminant PASP, inadequate TR jet. Venous: The inferior vena cava is normal in size with greater than 50% respiratory variability, suggesting right atrial pressure of 3 mmHg. IAS/Shunts: No atrial level shunt detected by color flow Doppler.  LEFT VENTRICLE PLAX 2D LVIDd:         3.80 cm LVIDs:         2.70 cm LV PW:         1.00 cm LV IVS:        0.90 cm LVOT diam:     1.90 cm LV SV:         85 LV SV Index:   56 LVOT Area:     2.84 cm  RIGHT VENTRICLE RV S prime:     14.00 cm/s TAPSE (M-mode): 2.0 cm LEFT ATRIUM             Index       RIGHT ATRIUM           Index LA diam:        3.20 cm 2.11 cm/m  RA Area:     10.50 cm LA Vol (A2C):   43.7 ml 28.88 ml/m RA Volume:   22.60 ml  14.93 ml/m LA Vol (A4C):   18.7 ml 12.36 ml/m LA Biplane Vol: 30.8 ml 20.35 ml/m  AORTIC VALVE AV Area (Vmax):    2.60 cm AV Area (Vmean):   2.96 cm AV Area (VTI):     3.45 cm AV Vmax:           149.31 cm/s AV Vmean:          100.407 cm/s AV VTI:            0.247 m AV Peak Grad:      8.9 mmHg AV Mean Grad:      4.6 mmHg LVOT  Vmax:         137.00 cm/s LVOT Vmean:        105.000 cm/s LVOT VTI:          0.301 m LVOT/AV VTI ratio: 1.22  AORTA Ao Root diam: 2.70 cm Ao Asc diam:  3.10 cm  SHUNTS Systemic VTI:   0.30 m Systemic Diam: 1.90 cm Carlyle Dolly MD Electronically signed by Carlyle Dolly MD Signature Date/Time: 08/24/2020/10:51:51 AM    Final       Scheduled Meds: . arformoterol  15 mcg Nebulization BID  . aspirin EC  81 mg Oral Daily  . budesonide (PULMICORT) nebulizer solution  0.5 mg Nebulization BID  . diltiazem  60 mg Oral BID  . enoxaparin (LOVENOX) injection  40 mg Subcutaneous Q24H  . feeding supplement  237 mL Oral BID BM  . hydroxyurea  500 mg Oral QODAY  . levothyroxine  88 mcg Oral QAC breakfast  . methylPREDNISolone (SOLU-MEDROL) injection  60 mg Intravenous Q8H  . multivitamin with minerals  1 tablet Oral Daily  . revefenacin  175 mcg Nebulization Daily  . rosuvastatin  40 mg Oral Daily   Continuous Infusions:    LOS: 4 days      Debbe Odea, MD [facility] Pager: [email or web address] 08/25/2020, 12:48 PM

## 2020-08-25 NOTE — Progress Notes (Signed)
Rounds made for possible transition to BiPAP settings. Per RN, patient wore CPAP only a couple of hours before taking it off with complaints of claustrophobia and was placed back on supplemental oxygen via nasal canula.

## 2020-08-25 NOTE — Progress Notes (Signed)
Patient reluctant to try BiPAP. Reassured that everything will be okay and she agreed to placement. Did not tolerate initial BiPAP settings, despite being set low and variable. However, she tolerated a continuous, constant pressure with CPAP. Left on CPAP for tonight on auto titration settings. Hopefully, RT will find success in setting adjustments once the patient is comfortable and less anxious about pressure changes during the inspiratory/expiratory phases.

## 2020-08-26 DIAGNOSIS — J441 Chronic obstructive pulmonary disease with (acute) exacerbation: Secondary | ICD-10-CM | POA: Diagnosis not present

## 2020-08-26 DIAGNOSIS — J9621 Acute and chronic respiratory failure with hypoxia: Secondary | ICD-10-CM | POA: Diagnosis not present

## 2020-08-26 DIAGNOSIS — J9622 Acute and chronic respiratory failure with hypercapnia: Secondary | ICD-10-CM | POA: Diagnosis not present

## 2020-08-26 LAB — BASIC METABOLIC PANEL
Anion gap: 9 (ref 5–15)
BUN: 9 mg/dL (ref 8–23)
CO2: 27 mmol/L (ref 22–32)
Calcium: 8.1 mg/dL — ABNORMAL LOW (ref 8.9–10.3)
Chloride: 101 mmol/L (ref 98–111)
Creatinine, Ser: 0.48 mg/dL (ref 0.44–1.00)
GFR, Estimated: >60 mL/min (ref >60)
Glucose, Bld: 135 mg/dL — ABNORMAL HIGH (ref 70–99)
Potassium: 3.1 mmol/L — ABNORMAL LOW (ref 3.5–5.1)
Sodium: 137 mmol/L (ref 135–145)

## 2020-08-26 LAB — PHOSPHORUS: Phosphorus: 1.3 mg/dL — ABNORMAL LOW (ref 2.5–4.6)

## 2020-08-26 LAB — MAGNESIUM: Magnesium: 2.1 mg/dL (ref 1.7–2.4)

## 2020-08-26 MED ORDER — PREDNISONE 50 MG PO TABS
50.0000 mg | ORAL_TABLET | Freq: Every day | ORAL | Status: DC
Start: 2020-08-27 — End: 2020-08-28
  Administered 2020-08-27 – 2020-08-28 (×2): 50 mg via ORAL
  Filled 2020-08-26 (×2): qty 1

## 2020-08-26 MED ORDER — MAGNESIUM SULFATE 4 GM/100ML IV SOLN
4.0000 g | Freq: Once | INTRAVENOUS | Status: DC
  Filled 2020-08-26: qty 100

## 2020-08-26 MED ORDER — POTASSIUM PHOSPHATES 15 MMOLE/5ML IV SOLN
24.0000 mmol | Freq: Once | INTRAVENOUS | Status: AC
  Administered 2020-08-26: 24 mmol via INTRAVENOUS
  Filled 2020-08-26: qty 8

## 2020-08-26 MED ORDER — POTASSIUM CHLORIDE CRYS ER 20 MEQ PO TBCR
40.0000 meq | EXTENDED_RELEASE_TABLET | ORAL | Status: DC
  Administered 2020-08-26: 40 meq via ORAL
  Filled 2020-08-26: qty 2

## 2020-08-26 MED ORDER — POTASSIUM CHLORIDE CRYS ER 20 MEQ PO TBCR
40.0000 meq | EXTENDED_RELEASE_TABLET | Freq: Once | ORAL | Status: AC
  Administered 2020-08-26: 40 meq via ORAL
  Filled 2020-08-26: qty 2

## 2020-08-26 MED ORDER — K PHOS MONO-SOD PHOS DI & MONO 155-852-130 MG PO TABS
500.0000 mg | ORAL_TABLET | Freq: Two times a day (BID) | ORAL | Status: AC
  Administered 2020-08-26 – 2020-08-27 (×3): 500 mg via ORAL
  Filled 2020-08-26 (×3): qty 2

## 2020-08-26 NOTE — Progress Notes (Addendum)
Patient is now on cpap. Will document time cpap is removed.  Cpap removed at 0656. Patient back on nasal cannula.

## 2020-08-26 NOTE — Progress Notes (Signed)
PROGRESS NOTE    Cheryl Haley   [id number]  DOB: Aug 25, 1956  DOA: 08/21/2020 PCP: [name], MD   Brief Narrative:  Cheryl Haley is a 64 y.o. year-old w/ hx of tobacco abuse, PNA, HTN, HL, hx CVA/ TIA, COPD/ emphysema brought to ED by EMS for eval of gen'd weakness, SOB and cough.  Seen at outside hospital recently and told may have COPD exacerbation, she rec'd Rx for prednisone for 10 days. She took the medication and was feeling a little better , but then again felt worsening dyspnea. She presented to the ED and was found to have a temp of 101.6. Given Vanc and Cefepime and admitted to the hospital.    Subjective: Less short of breath today.    Assessment & Plan:   Principal Problem: Acute on chronic respiratory failure with fever /   COPD (chronic obstructive pulmonary disease) with emphysema   - CT of the chest ordered to look for pneumonia and is unrevealing for an infection- No PE found either - appreciate pulmonary eval- She was started on Solumedrol on 12/23 - She tried to use the CPAP last night but could only tolerate it for 2 hrs.   Active Problems:  Sepsis-  due to bacteremia? Vs viral lung infection > blood culture 1/4 reveals Strep sanguinous started on Ceftriaxone on 12/22 - Temp 100.5 on 12/22 and has not recurred since - ID feels it may be a contaminant and Ceftriaxone was stopped on 12/23- following for fevers- none so far   Sinus tachycardia  -home Cardizem and Toprol were on hold for low BPs- Cardizem resumed   Hypovolemia - Improved with IVF which have been stopped   Cachexia (? Related to COPD) - add dietary supplements- nutrition consulted   Hypokalemia - replaced - follow     Hypothyroidism -cont Synthroid    Tobacco abuse       Essential thrombocythemia - cont Hydroxyurea     Time spent in minutes: 35 DVT prophylaxis: enoxaparin (LOVENOX) injection 40 mg Start: 08/21/20 2200  Code Status: Full code Family Communication:   Disposition Plan:  Status is: Inpatient  Remains inpatient appropriate because:Ongoing diagnostic testing needed not appropriate for outpatient work up   Dispo: The patient is from: Home              Anticipated d/c is to: Home              Anticipated d/c date is: 1 day              Patient currently is not medically stable to d/c.   Consultants:   pulmonary Procedures:    Antimicrobials:  Anti-infectives (From admission, onward)   Start     Dose/Rate Route Frequency Ordered Stop   08/22/20 2300  cefTRIAXone (ROCEPHIN) 2 g in sodium chloride 0.9 % 100 mL IVPB  Status:  Discontinued        2 g 200 mL/hr over 30 Minutes Intravenous Every 24 hours 08/22/20 2248 08/23/20 1631   08/22/20 1230  oseltamivir (TAMIFLU) capsule 30 mg  Status:  Discontinued        30 mg Oral 2 times daily 08/22/20 1142 08/22/20 1200   08/22/20 0500  vancomycin (VANCOREADY) IVPB 500 mg/100 mL  Status:  Discontinued        500 mg 100 mL/hr over 60 Minutes Intravenous Every 12 hours 08/21/20 1659 08/22/20 1142   08/22/20 0000  ceFEPIme (MAXIPIME) 2 g in sodium chloride 0.9 % 100 mL  IVPB  Status:  Discontinued        2 g 200 mL/hr over 30 Minutes Intravenous Every 8 hours 08/21/20 1659 08/22/20 1142   08/21/20 1600  ceFEPIme (MAXIPIME) 2 g in sodium chloride 0.9 % 100 mL IVPB        2 g 200 mL/hr over 30 Minutes Intravenous  Once 08/21/20 1557 08/21/20 1659   08/21/20 1600  vancomycin (VANCOCIN) IVPB 1000 mg/200 mL premix        1,000 mg 200 mL/hr over 60 Minutes Intravenous  Once 08/21/20 1557 08/21/20 1811   08/21/20 1600  metroNIDAZOLE (FLAGYL) tablet 500 mg  Status:  Discontinued        500 mg Oral Every 8 hours 08/21/20 1557 08/22/20 1150       Objective: Vitals:   08/25/20 1226 08/25/20 1947 08/25/20 2115 08/26/20 0503  BP: 114/66 (!) 122/57  (!) 150/77  Pulse: 100 90  85  Resp: 18   17  Temp: (!) 97.5 F (36.4 C) 97.8 F (36.6 C)  97.7 F (36.5 C)  TempSrc: Oral Oral  Oral  SpO2: 97%  100% 97% 98%  Weight:    52 kg    Intake/Output Summary (Last 24 hours) at 08/26/2020 1241 Last data filed at 08/26/2020 0800 Gross per 24 hour  Intake 240 ml  Output 2400 ml  Net -2160 ml   Filed Weights   08/24/20 0155 08/25/20 0122 08/26/20 0503  Weight: 49.6 kg 51.4 kg 52 kg    Examination: General exam: Appears comfortable  HEENT: PERRLA, oral mucosa moist, no sclera icterus or thrush Respiratory system: Clear to auscultation. Respiratory effort normal. Cardiovascular system: S1 & S2 heard,  No murmurs  Gastrointestinal system: Abdomen soft, non-tender, nondistended. Normal bowel sounds   Central nervous system: Alert and oriented. No focal neurological deficits. Extremities: No cyanosis, clubbing or edema Skin: No rashes or ulcers Psychiatry:  Mood & affect appropriate.    Data Reviewed: I have personally reviewed following labs and imaging studies  CBC: Recent Labs  Lab 08/21/20 1406 08/22/20 0038  WBC 5.7 4.1  NEUTROABS 4.7  --   HGB 12.2 10.3*  HCT 35.6* 30.3*  MCV 94.7 94.7  PLT 191 [id number]*   Basic Metabolic Panel: Recent Labs  Lab 08/21/20 1406 08/22/20 0038 08/25/20 0236 08/26/20 0424  NA 126* 133*  --  137  K 3.1* 3.3*  --  3.1*  CL 85* 96*  --  101  CO2 29 27  --  27  GLUCOSE 157* 112*  --  135*  BUN 21 13  --  9  CREATININE 0.93 0.74  --  0.48  CALCIUM 8.3* 7.9*  --  8.1*  MG  --  1.9 2.1 2.1  PHOS  --   --   --  1.3*   GFR: Estimated Creatinine Clearance: 56.8 mL/min (by C-G formula based on SCr of 0.48 mg/dL). Liver Function Tests: Recent Labs  Lab 08/21/20 1406 08/22/20 0038  AST 28 26  ALT 23 20  ALKPHOS 66 53  BILITOT 0.7 0.7  PROT 6.4* 5.6*  ALBUMIN 3.0* 2.6*   No results for input(s): LIPASE, AMYLASE in the last 168 hours. No results for input(s): AMMONIA in the last 168 hours. Coagulation Profile: No results for input(s): INR, PROTIME in the last 168 hours. Cardiac Enzymes: No results for input(s): CKTOTAL, CKMB,  CKMBINDEX, TROPONINI in the last 168 hours. BNP (last 3 results) No results for input(s): PROBNP in the last 8760 hours.  HbA1C: No results for input(s): HGBA1C in the last 72 hours. CBG: No results for input(s): GLUCAP in the last 168 hours. Lipid Profile: No results for input(s): CHOL, HDL, LDLCALC, TRIG, CHOLHDL, LDLDIRECT in the last 72 hours. Thyroid Function Tests: No results for input(s): TSH, T4TOTAL, FREET4, T3FREE, THYROIDAB in the last 72 hours. Anemia Panel: No results for input(s): VITAMINB12, FOLATE, FERRITIN, TIBC, IRON, RETICCTPCT in the last 72 hours. Urine analysis:    Component Value Date/Time   COLORURINE YELLOW 08/21/2020 1822   APPEARANCEUR HAZY (A) 08/21/2020 1822   LABSPEC 1.013 08/21/2020 1822   PHURINE 6.0 08/21/2020 1822   GLUCOSEU 150 (A) 08/21/2020 1822   HGBUR SMALL (A) 08/21/2020 1822   BILIRUBINUR NEGATIVE 08/21/2020 1822   KETONESUR 5 (A) 08/21/2020 1822   PROTEINUR 100 (A) 08/21/2020 1822   UROBILINOGEN 1.0 09/20/2013 1621   NITRITE NEGATIVE 08/21/2020 1822   LEUKOCYTESUR NEGATIVE 08/21/2020 1822   Sepsis Labs: @LABRCNTIP (procalcitonin:4,lacticidven:4) ) Recent Results (from the past 240 hour(s))  Resp Panel by RT-PCR (Flu A&B, Covid) Nasopharyngeal Swab     Status: None   Collection Time: 08/21/20  2:07 PM   Specimen: Nasopharyngeal Swab; Nasopharyngeal(NP) swabs in vial transport medium  Result Value Ref Range Status   SARS Coronavirus 2 by RT PCR NEGATIVE NEGATIVE Final    Comment: (NOTE) SARS-CoV-2 target nucleic acids are NOT DETECTED.  The SARS-CoV-2 RNA is generally detectable in upper respiratory specimens during the acute phase of infection. The lowest concentration of SARS-CoV-2 viral copies this assay can detect is 138 copies/mL. A negative result does not preclude SARS-Cov-2 infection and should not be used as the sole basis for treatment or other patient management decisions. A negative result may occur with  improper  specimen collection/handling, submission of specimen other than nasopharyngeal swab, presence of viral mutation(s) within the areas targeted by this assay, and inadequate number of viral copies(<138 copies/mL). A negative result must be combined with clinical observations, patient history, and epidemiological information. The expected result is Negative.  Fact Sheet for Patients:  EntrepreneurPulse.com.au  Fact Sheet for Healthcare Providers:  IncredibleEmployment.be  This test is no t yet approved or cleared by the [place] FDA and  has been authorized for detection and/or diagnosis of SARS-CoV-2 by FDA under an Emergency Use Authorization (EUA). This EUA will remain  in effect (meaning this test can be used) for the duration of the COVID-19 declaration under Section 564(b)(1) of the Act, 21 U.S.C.section 360bbb-3(b)(1), unless the authorization is terminated  or revoked sooner.       Influenza A by PCR NEGATIVE NEGATIVE Final   Influenza B by PCR NEGATIVE NEGATIVE Final    Comment: (NOTE) The Xpert Xpress SARS-CoV-2/FLU/RSV plus assay is intended as an aid in the diagnosis of influenza from Nasopharyngeal swab specimens and should not be used as a sole basis for treatment. Nasal washings and aspirates are unacceptable for Xpert Xpress SARS-CoV-2/FLU/RSV testing.  Fact Sheet for Patients: EntrepreneurPulse.com.au  Fact Sheet for Healthcare Providers: IncredibleEmployment.be  This test is not yet approved or cleared by the [place] FDA and has been authorized for detection and/or diagnosis of SARS-CoV-2 by FDA under an Emergency Use Authorization (EUA). This EUA will remain in effect (meaning this test can be used) for the duration of the COVID-19 declaration under Section 564(b)(1) of the Act, 21 U.S.C. section 360bbb-3(b)(1), unless the authorization is terminated or revoked.  Performed at  [facility], [place] [address] [place], [place] [address]   Blood  Culture (routine x 2)     Status: Abnormal   Collection Time: 08/21/20  4:23 PM   Specimen: BLOOD  Result Value Ref Range Status   Specimen Description BLOOD SITE NOT SPECIFIED  Final   Special Requests   Final    BOTTLES DRAWN AEROBIC AND ANAEROBIC Blood Culture adequate volume   Culture  Setup Time   Final    AEROBIC BOTTLE ONLY GRAM POSITIVE COCCI Organism ID to follow CRITICAL RESULT CALLED TO, READ BACK BY AND VERIFIED WITH: T DANG PHARMD 08/22/20 2236 JDW    Culture (A)  Final    STREPTOCOCCUS SANGUINIS THE SIGNIFICANCE OF ISOLATING THIS ORGANISM FROM A SINGLE SET OF BLOOD CULTURES WHEN MULTIPLE SETS ARE DRAWN IS UNCERTAIN. PLEASE NOTIFY THE MICROBIOLOGY DEPARTMENT WITHIN ONE WEEK IF SPECIATION AND SENSITIVITIES ARE REQUIRED. Performed at [facility], [place] [address] [place], [place] [address]    Report Status 08/24/2020 FINAL  Final  Blood Culture ID Panel (Reflexed)     Status: Abnormal   Collection Time: 08/21/20  4:23 PM  Result Value Ref Range Status   Enterococcus faecalis NOT DETECTED NOT DETECTED Final   Enterococcus Faecium NOT DETECTED NOT DETECTED Final   Listeria monocytogenes NOT DETECTED NOT DETECTED Final   Staphylococcus species NOT DETECTED NOT DETECTED Final   Staphylococcus aureus (BCID) NOT DETECTED NOT DETECTED Final   Staphylococcus epidermidis NOT DETECTED NOT DETECTED Final   Staphylococcus lugdunensis NOT DETECTED NOT DETECTED Final   Streptococcus species DETECTED (A) NOT DETECTED Final    Comment: Not Enterococcus species, Streptococcus agalactiae, Streptococcus pyogenes, or Streptococcus pneumoniae. CRITICAL RESULT CALLED TO, READ BACK BY AND VERIFIED WITH: T DANG PHARMD 08/22/20 2236 JDW    Streptococcus agalactiae NOT DETECTED NOT DETECTED Final   Streptococcus pneumoniae NOT DETECTED NOT DETECTED Final   Streptococcus pyogenes NOT DETECTED NOT DETECTED Final    A.calcoaceticus-baumannii NOT DETECTED NOT DETECTED Final   Bacteroides fragilis NOT DETECTED NOT DETECTED Final   Enterobacterales NOT DETECTED NOT DETECTED Final   Enterobacter cloacae complex NOT DETECTED NOT DETECTED Final   Escherichia coli NOT DETECTED NOT DETECTED Final   Klebsiella aerogenes NOT DETECTED NOT DETECTED Final   Klebsiella oxytoca NOT DETECTED NOT DETECTED Final   Klebsiella pneumoniae NOT DETECTED NOT DETECTED Final   Proteus species NOT DETECTED NOT DETECTED Final   Salmonella species NOT DETECTED NOT DETECTED Final   Serratia marcescens NOT DETECTED NOT DETECTED Final   Haemophilus influenzae NOT DETECTED NOT DETECTED Final   Neisseria meningitidis NOT DETECTED NOT DETECTED Final   Pseudomonas aeruginosa NOT DETECTED NOT DETECTED Final   Stenotrophomonas maltophilia NOT DETECTED NOT DETECTED Final   Candida albicans NOT DETECTED NOT DETECTED Final   Candida auris NOT DETECTED NOT DETECTED Final   Candida glabrata NOT DETECTED NOT DETECTED Final   Candida krusei NOT DETECTED NOT DETECTED Final   Candida parapsilosis NOT DETECTED NOT DETECTED Final   Candida tropicalis NOT DETECTED NOT DETECTED Final   Cryptococcus neoformans/gattii NOT DETECTED NOT DETECTED Final    Comment: Performed at [facility], [address], [place], [place] [address]  Blood Culture (routine x 2)     Status: None (Preliminary result)   Collection Time: 08/22/20 12:38 AM   Specimen: BLOOD RIGHT HAND  Result Value Ref Range Status   Specimen Description BLOOD RIGHT HAND  Final   Special Requests   Final    BOTTLES DRAWN AEROBIC AND ANAEROBIC Blood Culture adequate volume   Culture  Final    NO GROWTH 3 DAYS Performed at [facility], [place] [address], [place], [place] [address]    Report Status PENDING  Incomplete  Respiratory Panel by PCR     Status: None   Collection Time: 08/22/20  1:02 PM   Specimen: Nasopharyngeal Swab; Respiratory  Result Value Ref Range Status    Adenovirus NOT DETECTED NOT DETECTED Final   Coronavirus 229E NOT DETECTED NOT DETECTED Final    Comment: (NOTE) The Coronavirus on the Respiratory Panel, DOES NOT test for the novel  Coronavirus (2019 nCoV)    Coronavirus HKU1 NOT DETECTED NOT DETECTED Final   Coronavirus NL63 NOT DETECTED NOT DETECTED Final   Coronavirus OC43 NOT DETECTED NOT DETECTED Final   Metapneumovirus NOT DETECTED NOT DETECTED Final   Rhinovirus / Enterovirus NOT DETECTED NOT DETECTED Final   Influenza A NOT DETECTED NOT DETECTED Final   Influenza B NOT DETECTED NOT DETECTED Final   Parainfluenza Virus 1 NOT DETECTED NOT DETECTED Final   Parainfluenza Virus 2 NOT DETECTED NOT DETECTED Final   Parainfluenza Virus 3 NOT DETECTED NOT DETECTED Final   Parainfluenza Virus 4 NOT DETECTED NOT DETECTED Final   Respiratory Syncytial Virus NOT DETECTED NOT DETECTED Final   Bordetella pertussis NOT DETECTED NOT DETECTED Final   Bordetella Parapertussis NOT DETECTED NOT DETECTED Final   Chlamydophila pneumoniae NOT DETECTED NOT DETECTED Final   Mycoplasma pneumoniae NOT DETECTED NOT DETECTED Final    Comment: Performed at [facility], [place], [place], [place] [address]         Radiology Studies: No results found.    Scheduled Meds:  arformoterol  15 mcg Nebulization BID   aspirin EC  81 mg Oral Daily   budesonide (PULMICORT) nebulizer solution  0.5 mg Nebulization BID   diltiazem  60 mg Oral BID   enoxaparin (LOVENOX) injection  40 mg Subcutaneous Q24H   feeding supplement  237 mL Oral BID BM   hydroxyurea  500 mg Oral QODAY   levothyroxine  88 mcg Oral QAC breakfast   methylPREDNISolone (SOLU-MEDROL) injection  60 mg Intravenous Q8H   multivitamin with minerals  1 tablet Oral Daily   phosphorus  500 mg Oral BID   revefenacin  175 mcg Nebulization Daily   rosuvastatin  40 mg Oral Daily   Continuous Infusions:    LOS: 5 days      Debbe Odea, MD [facility] Pager: [email or web address] 08/26/2020, 12:41 PM

## 2020-08-26 NOTE — Telephone Encounter (Signed)
  Triage  Been rounding on patient x 3 days. Prior patient of Drs Sherrin Daisy and Carlis Abbott. She wants to establish with me. Not seen [name] so far at all -> give app followup in 7-10 days and then first avail with me in feb 2022/march 2022  Thanks  MR

## 2020-08-26 NOTE — Progress Notes (Addendum)
NAME:  Cheryl Haley, MRN:  [id number], DOB:  [birth year], LOS: 5 ADMISSION DATE:  08/21/2020, CONSULTATION DATE:  08/22/2020 REFERRING MD:  Dr. Wynelle Cleveland, [facility], CHIEF COMPLAINT:  Short of breath   Brief History:  64 yo female, former smoker, with chronic hypoxic (2L) resp failure who presented with progressive dyspnea and weakness.  She had recent hospitalization at [facility] for COPD exacerbation (her only one in several years).  She had persistent dyspnea and fever (temp 101).  She is followed in pulmonary office by [name] and to establish with Dr. Dewald.(changed to DR Chase Caller 08/26/2020)  She developed fever, decreased appetite, fatigue and weakness earlier this month.  She was admitted to [facility] and treated for COPD exacerbation.  Several days after discharge she developed recurrence of her symptoms.  She also thinks she has lost about 10 lbs.  She has felt her heart rate going fast.  She quit smoking 3-4 years ago.  No recent travel history or sick exposures.  No animal/bird exposures.  Past Medical History:  HTN, PNA, CVA, HLD, COPD with emphysema, Chronic hypoxic respiratory failure, Hypothyroidism, Hiatal hernia, Essential thrombocythemia, OA, Anxiety, Influenza A January 2015  Significant Hospital Events:  12/21 Admit 12/22 PCCM consulted  12/23  - start solumedrol 12/24 - Pt reports feeling better than on admission but not back to baseline .On 2L  1225 = ABG without hypercapnia.  Apprehensive about using BiPAP.  I think she use CPAP for few hours last night.  Subjective improvement not clear.  This morning she tells me that even though she is better since the time we started Solu-Medrol 2 days ago she does not think she is as good as yesterday.  She complains of some dyspnea on exertion.  She is willing to try noninvasive positive pressure ventilation again tonight.  She tells me that hospitalist has agreed to give her some Tylenol PM to help her sleep better and then  she would use the mask. She is afebrile since 08/22/2020 Consults:    Procedures:    Significant Diagnostic Tests:   Spirometry 12/26/13 >> FEV1 0.8 (33%), FEV1% 41  CT angio chest 08/22/20 >> no acute process, negative for PE, bilateral pulmonary nodules, severe background emphysema  Micro Data:  COVID/flu 12/21 >> negative Blood 12/21 >> GPC 1/2 >>  BCID 12/21 >> strep species detected Blood 12/22 >> RVP 12/22 >> negative   Antimicrobials:   Anti-infectives (From admission, onward)   Start     Dose/Rate Route Frequency Ordered Stop   08/22/20 2300  cefTRIAXone (ROCEPHIN) 2 g in sodium chloride 0.9 % 100 mL IVPB  Status:  Discontinued        2 g 200 mL/hr over 30 Minutes Intravenous Every 24 hours 08/22/20 2248 08/23/20 1631   08/22/20 1230  oseltamivir (TAMIFLU) capsule 30 mg  Status:  Discontinued        30 mg Oral 2 times daily 08/22/20 1142 08/22/20 1200   08/22/20 0500  vancomycin (VANCOREADY) IVPB 500 mg/100 mL  Status:  Discontinued        500 mg 100 mL/hr over 60 Minutes Intravenous Every 12 hours 08/21/20 1659 08/22/20 1142   08/22/20 0000  ceFEPIme (MAXIPIME) 2 g in sodium chloride 0.9 % 100 mL IVPB  Status:  Discontinued        2 g 200 mL/hr over 30 Minutes Intravenous Every 8 hours 08/21/20 1659 08/22/20 1142   08/21/20 1600  ceFEPIme (MAXIPIME) 2 g in sodium chloride 0.9 % 100  mL IVPB        2 g 200 mL/hr over 30 Minutes Intravenous  Once 08/21/20 1557 08/21/20 1659   08/21/20 1600  vancomycin (VANCOCIN) IVPB 1000 mg/200 mL premix        1,000 mg 200 mL/hr over 60 Minutes Intravenous  Once 08/21/20 1557 08/21/20 1811   08/21/20 1600  metroNIDAZOLE (FLAGYL) tablet 500 mg  Status:  Discontinued        500 mg Oral Every 8 hours 08/21/20 1557 08/22/20 1150       Interim History / Subjective:   08/26/2020 -says she used some cpap last night.  Does not want to go to [facility] or SNF or inpatient rehab. Says is afraid of covid. Remains unvaccinated but reports she  will have covid. Feels better. Wants to go home. Wants to establish with myself Dr Chase Caller as opd  Her phos is very low  Objective   BP (!) 146/60 (BP Location: Right Arm)   Pulse 100   Temp 98.2 F (36.8 C) (Oral)   Resp 20   Wt 52 kg   SpO2 94%   BMI 19.67 kg/m         Intake/Output Summary (Last 24 hours) at 08/26/2020 1514 Last data filed at 08/26/2020 0800 Gross per 24 hour  Intake 240 ml  Output 2200 ml  Net -1960 ml   Filed Weights   08/24/20 0155 08/25/20 0122 08/26/20 0503  Weight: 49.6 kg 51.4 kg 52 kg   Exam Looks better sittingin bed and on cell phone barrell chest without wheeze No resp distress ABd soft Normal heart sounds Cachexia ABd soft  Discussion:  64 yo female with recurrent fever, dyspnea, fatigue, and myalgia.  She was also found to have hypovolemia, hyponatremia, and tachycardia.  Reports recent weight loss. 08/24/2020 much improved with steroids oxygen therapy and diuresis.  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  Anecdotally have several patients like this with COPD who have been doing well without awareness of COPD and then have an exacerbation and then there had deterioration of their functional status in the dependent on oxygen and the cachexia is revealed/more apparent.    12/26 = 08/25/2020 amnd 12/26 - steadily better. On [place] o2    Plan -Prognosis: I have discussed with her and her sister that she is unlikely to return to full functional baseline status as of 1 or 2 months ago.  However progressive improvement over time slowly and steadily is possible but never returning to full functional baseline state.  -To achieve  improvement in functional status recommend  -Inpatient rehabilitation versus long-term acute care [she is somewhat reluctant to this idea but is willing to hear her options.  She might be more open to home physical therapy]  --Scheduled nebulization with oxygen to continue   - yupelri + brovana + pulmicort  (OR) can do TRELEGY MDI (OR) BREZTRI MDI (OR) SPIRIVA + BREO   + alb prn  -Given normal CO2 she does not qualify for home BiPAP therapy     - okay to do noninvasive positive pressure ventilation at night for a few days to aid her recovery  -Continue Solu-Medrol through 08/26/2020 and then start  prednisone and with 3-4 week taper    - start prednisone 50mg  per day 12/26 and [facility] MD to write out a 3-4 weeks taper  [I did tell her that she might ultimately end up being classified as prednisone dependent and might require low-dose prednisone but at this stage I do want to see if she can be without prednisone after treatment of the current exacerbation]  -Outpatient she will need Roflumilast to prevent COPD exacerbations  -= await  alpha-1 antitrypsin phenotype results 08/26/20   #hypophostaemia  - very low and can explain fatigue hypokalemia  Plan  - repelte   #Anemia  08/26/2020 - no bleeding  Plan  - PRBC for hgb </= 6.9gm%    - exceptions are   -  if ACS susepcted/confirmed then transfuse for hgb </= 8.0gm%,  or    -  active bleeding with hemodynamic instability, then transfuse regardless of hemoglobin value   At at all times try to transfuse 1 unit prbc as possible with exception of active hemorrhage   #physical deconditioning  08/26/2020\ - refused snf rehab  Plan  - needs home PT  #vaccine counseling   - not vaccinated against covid but counseled  Plan  - she has agreed to get covid vaccine   Best practice:  According to the hospitalist    Ccm will sign off. MEssage sent to office to set up opd appt   SIGNATURE    Dr. Brand Males, M.D., F.C.C.P,   Pulmonary and Critical Care Medicine Staff Physician, [place] Director - Interstitial Lung Disease  Program  [facility] [place], [place], [address]  Pager: [phone], If no answer  OR between  19:00-7:00h: page [phone] Telephone (clinical office): [phone] Telephone (research): [phone]  3:14 PM 08/26/2020    LABS    PULMONARY Recent Labs  Lab 08/24/20 1638  PHART 7.425  PCO2ART 38.6  PO2ART 74.0*  HCO3 25.0  O2SAT 95.9    CBC Recent Labs  Lab 08/21/20 1406 08/22/20 0038  HGB 12.2 10.3*  HCT 35.6* 30.3*  WBC 5.7 4.1  PLT 191 136*    COAGULATION No results for input(s): INR in the last 168 hours.  CARDIAC  No results for input(s): TROPONINI in the last 168 hours. No results for input(s): PROBNP in the last 168 hours.   CHEMISTRY Recent Labs  Lab 08/21/20 1406 08/22/20 0038 08/25/20 0236 08/26/20 0424  NA 126* 133*  --  137  K 3.1* 3.3*  --  3.1*  CL 85* 96*  --  101  CO2 29 27  --  27  GLUCOSE 157* 112*  --  135*  BUN 21 13  --  9  CREATININE 0.93 0.74  --  0.48  CALCIUM 8.3* 7.9*  --  8.1*  MG  --  1.9 2.1 2.1  PHOS  --   --   --  1.3*   Estimated Creatinine Clearance: 56.8 mL/min (by C-G formula based on SCr of 0.48 mg/dL).   LIVER Recent Labs  Lab 08/21/20 1406 08/22/20 0038  AST 28 26  ALT 23 20  ALKPHOS 66 53  BILITOT 0.7 0.7  PROT 6.4* 5.6*  ALBUMIN 3.0* 2.6*     INFECTIOUS Recent Labs  Lab 08/21/20 1406 08/22/20 0038  LATICACIDVEN 1.1 1.1     ENDOCRINE CBG (last 3)  No results for input(s): GLUCAP in the last 72  hours.       IMAGING x48h  - image(s) personally visualized  -   highlighted in bold No results found.

## 2020-08-27 DIAGNOSIS — J441 Chronic obstructive pulmonary disease with (acute) exacerbation: Secondary | ICD-10-CM

## 2020-08-27 DIAGNOSIS — J9621 Acute and chronic respiratory failure with hypoxia: Secondary | ICD-10-CM

## 2020-08-27 DIAGNOSIS — J9622 Acute and chronic respiratory failure with hypercapnia: Secondary | ICD-10-CM

## 2020-08-27 LAB — BASIC METABOLIC PANEL
Anion gap: 9 (ref 5–15)
BUN: 8 mg/dL (ref 8–23)
CO2: 25 mmol/L (ref 22–32)
Calcium: 7.9 mg/dL — ABNORMAL LOW (ref 8.9–10.3)
Chloride: 101 mmol/L (ref 98–111)
Creatinine, Ser: 0.45 mg/dL (ref 0.44–1.00)
GFR, Estimated: >60 mL/min (ref >60)
Glucose, Bld: 136 mg/dL — ABNORMAL HIGH (ref 70–99)
Potassium: 3.5 mmol/L (ref 3.5–5.1)
Sodium: 135 mmol/L (ref 135–145)

## 2020-08-27 LAB — LEGIONELLA PNEUMOPHILA SEROGP 1 UR AG: L. pneumophila Serogp 1 Ur Ag: NEGATIVE

## 2020-08-27 LAB — PHOSPHORUS: Phosphorus: 2.2 mg/dL — ABNORMAL LOW (ref 2.5–4.6)

## 2020-08-27 LAB — CULTURE, BLOOD (ROUTINE X 2)
Culture: NO GROWTH
Special Requests: ADEQUATE

## 2020-08-27 MED ORDER — METOPROLOL SUCCINATE ER 25 MG PO TB24
12.5000 mg | ORAL_TABLET | Freq: Every day | ORAL | Status: DC
  Administered 2020-08-27: 12.5 mg via ORAL
  Filled 2020-08-27: qty 1

## 2020-08-27 MED ORDER — K PHOS MONO-SOD PHOS DI & MONO 155-852-130 MG PO TABS
500.0000 mg | ORAL_TABLET | Freq: Two times a day (BID) | ORAL | Status: AC
  Administered 2020-08-27 – 2020-08-28 (×3): 500 mg via ORAL
  Filled 2020-08-27 (×3): qty 2

## 2020-08-27 MED ORDER — GUAIFENESIN ER 600 MG PO TB12
600.0000 mg | ORAL_TABLET | Freq: Two times a day (BID) | ORAL | Status: DC
  Administered 2020-08-27 – 2020-08-28 (×3): 600 mg via ORAL
  Filled 2020-08-27 (×3): qty 1

## 2020-08-27 MED ORDER — DILTIAZEM HCL 60 MG PO TABS
90.0000 mg | ORAL_TABLET | Freq: Two times a day (BID) | ORAL | Status: DC
  Administered 2020-08-27 – 2020-08-28 (×2): 90 mg via ORAL
  Filled 2020-08-27 (×2): qty 1

## 2020-08-27 NOTE — Telephone Encounter (Signed)
Appt with Beth 09/10/20 and appt with MR for 10/23/20 at 11:00 scheduled.

## 2020-08-27 NOTE — Plan of Care (Signed)

## 2020-08-27 NOTE — Care Management Important Message (Signed)
Important Message  Patient Details  Name: Cheryl Haley MRN: [id number] Date of Birth: 03-Dec-1955   Medicare Important Message Given:  Yes     Renie Ora 08/27/2020, 9:59 AM

## 2020-08-27 NOTE — Progress Notes (Signed)
Physical Therapy Treatment Patient Details Name: Cheryl Haley MRN: [id number] DOB: 12-29-1955 Today's Date: 08/27/2020    History of Present Illness 64 y.o. year-old w/ hx of tobacco abuse, PNA, HTN, HL, hx CVA/ TIA, COPD/ emphysema brought to ED by EMS for eval of gen'd weakness, SOB and cough.    PT Comments    Pt progressing steadily towards her physical therapy goals. Ambulating 100 ft, then another 100 ft with a Rollator and seated rest break in between. SpO2 94-95% on 2L O2, HR peak 124 bpm. Education provided regarding appropriate DME, activity recommendation, and warm up exercises. D/c plan remains appropriate.    Follow Up Recommendations  Home health PT;Supervision - Intermittent     Equipment Recommendations  None recommended by PT    Recommendations for Other Services       Precautions / Restrictions Precautions Precautions: Fall Restrictions Weight Bearing Restrictions: No    Mobility  Bed Mobility               General bed mobility comments: OOB in chair  Transfers Overall transfer level: Needs assistance Equipment used: 4-wheeled walker Transfers: Sit to/from Stand Sit to Stand: Supervision            Ambulation/Gait Ambulation/Gait assistance: Min guard Gait Distance (Feet): 200 Feet Assistive device: 4-wheeled walker Gait Pattern/deviations: Step-through pattern;Decreased stride length Gait velocity: decreased   General Gait Details: Requiring min guard assist for safety, one seated rest break, cues for Rollator management   Stairs             Wheelchair Mobility    Modified Rankin (Stroke Patients Only)       Balance Overall balance assessment: Needs assistance Sitting-balance support: No upper extremity supported;Feet supported Sitting balance-Leahy Scale: Good     Standing balance support: No upper extremity supported;During functional activity Standing balance-Leahy Scale: Fair                               Cognition Arousal/Alertness: Awake/alert Behavior During Therapy: WFL for tasks assessed/performed Overall Cognitive Status: Within Functional Limits for tasks assessed                                        Exercises General Exercises - Lower Extremity Long Arc Quad: Both;10 reps;Seated Hip Flexion/Marching: Both;10 reps;Seated    General Comments        Pertinent Vitals/Pain Pain Assessment: Faces Faces Pain Scale: No hurt    Home Living                      Prior Function            PT Goals (current goals can now be found in the care plan section) Acute Rehab PT Goals Patient Stated Goal: breathe better Potential to Achieve Goals: Good Progress towards PT goals: Progressing toward goals    Frequency    Min 3X/week      PT Plan Current plan remains appropriate    Co-evaluation              AM-PAC PT "6 Clicks" Mobility   Outcome Measure  Help needed turning from your back to your side while in a flat bed without using bedrails?: None Help needed moving from lying on your back to sitting on the side of a flat bed without  using bedrails?: None Help needed moving to and from a bed to a chair (including a wheelchair)?: None Help needed standing up from a chair using your arms (e.g., wheelchair or bedside chair)?: None Help needed to walk in hospital room?: A Little Help needed climbing 3-5 steps with a railing? : A Lot 6 Click Score: 21    End of Session Equipment Utilized During Treatment: Oxygen Activity Tolerance: Patient tolerated treatment well Patient left: in chair;with call bell/phone within reach;with family/visitor present Nurse Communication: Mobility status PT Visit Diagnosis: Other abnormalities of gait and mobility (R26.89);Muscle weakness (generalized) (M62.81);Other (comment)     Time: 1109-1130 PT Time Calculation (min) (ACUTE ONLY): 21 min  Charges:  $Therapeutic Activity: 8-22 mins                      Lillia Pauls, PT, DPT Acute Rehabilitation Services Pager [phone] Office [phone]    Norval Morton 08/27/2020, 1:01 PM

## 2020-08-27 NOTE — Progress Notes (Addendum)
PROGRESS NOTE    Cheryl Haley   MVE:[id number]  DOB: Nov 26, 1955  DOA: 08/21/2020 PCP: Catha Gosselin, MD   Brief Narrative:  Cheryl Haley is a 64 y.o. year-old w/ hx of tobacco abuse, PNA, HTN, HL, hx CVA/ TIA, COPD/ emphysema brought to ED by EMS for eval of gen'd weakness, SOB and cough.  Seen at outside hospital recently and told may have COPD exacerbation, she rec'd Rx for prednisone for 10 days. She took the medication and was feeling a little better , but then again felt worsening dyspnea. She presented to the ED and was found to have a temp of 101.6. Given Vanc and Cefepime and admitted to the hospital.    Subjective: Breathing is a littler better but not by much. Still short of breath when ambulating. She is coughing more now and having trouble bringing up the mucous.   Assessment & Plan:   Principal Problem: Acute on chronic respiratory failure with fever /   COPD (chronic obstructive pulmonary disease) with emphysema   - CT of the chest ordered to look for pneumonia and is unrevealing for an infection- No PE found either - appreciate pulmonary eval- She was started on Solumedrol on 12/23 - She tried to use the CPAP but could only tolerate it for 2 hrs.  - add Mucinex and flutter valve for increasing sputum - cont steroids- increase ambulation today  Active Problems:  Sepsis-  due to bacteremia? Vs viral lung infection > blood culture 1/4 reveals Strep sanguinous started on Ceftriaxone on 12/22 - Temp 100.5 on 12/22 and has not recurred since - ID feels it may be a contaminant and Ceftriaxone was stopped on 12/23- following for fevers- none so far   Sinus tachycardia  -home Cardizem and Toprol were on hold for low BPs- Cardizem resumed   HTN - increase Cardizem to 90 BID - resume Toprol and follow BP  Hypovolemia - Improved with IVF which have been stopped   Cachexia (? Related to COPD) - added dietary supplements- nutrition consulted - following appetite  and oral intake  Hypokalemia- hypophosphatemia -K has been replaced - follow -cont replacing Phos and recheck tomorrow     Hypothyroidism -cont Synthroid    Tobacco abuse       Essential thrombocythemia - cont Hydroxyurea     Time spent in minutes: 35 DVT prophylaxis: Place TED hose Start: 08/27/20 0951 enoxaparin (LOVENOX) injection 40 mg Start: 08/21/20 2200  Code Status: Full code Family Communication:  Disposition Plan:  Status is: Inpatient  Remains inpatient appropriate because:Ongoing diagnostic testing needed not appropriate for outpatient work up   Dispo: The patient is from: Home              Anticipated d/c is to: Home              Anticipated d/c date is: 1 day              Patient currently is not medically stable to d/c.   Consultants:   pulmonary Procedures:    Antimicrobials:  Anti-infectives (From admission, onward)   Start     Dose/Rate Route Frequency Ordered Stop   08/22/20 2300  cefTRIAXone (ROCEPHIN) 2 g in sodium chloride 0.9 % 100 mL IVPB  Status:  Discontinued        2 g 200 mL/hr over 30 Minutes Intravenous Every 24 hours 08/22/20 2248 08/23/20 1631   08/22/20 1230  oseltamivir (TAMIFLU) capsule 30 mg  Status:  Discontinued  30 mg Oral 2 times daily 08/22/20 1142 08/22/20 1200   08/22/20 0500  vancomycin (VANCOREADY) IVPB 500 mg/100 mL  Status:  Discontinued        500 mg 100 mL/hr over 60 Minutes Intravenous Every 12 hours 08/21/20 1659 08/22/20 1142   08/22/20 0000  ceFEPIme (MAXIPIME) 2 g in sodium chloride 0.9 % 100 mL IVPB  Status:  Discontinued        2 g 200 mL/hr over 30 Minutes Intravenous Every 8 hours 08/21/20 1659 08/22/20 1142   08/21/20 1600  ceFEPIme (MAXIPIME) 2 g in sodium chloride 0.9 % 100 mL IVPB        2 g 200 mL/hr over 30 Minutes Intravenous  Once 08/21/20 1557 08/21/20 1659   08/21/20 1600  vancomycin (VANCOCIN) IVPB 1000 mg/200 mL premix        1,000 mg 200 mL/hr over 60 Minutes Intravenous  Once  08/21/20 1557 08/21/20 1811   08/21/20 1600  metroNIDAZOLE (FLAGYL) tablet 500 mg  Status:  Discontinued        500 mg Oral Every 8 hours 08/21/20 1557 08/22/20 1150       Objective: Vitals:   08/27/20 0433 08/27/20 0848 08/27/20 0852 08/27/20 0854  BP: (!) 150/73     Pulse: 92 (!) 106    Resp: 18 18    Temp: 98.2 F (36.8 C)     TempSrc: Oral     SpO2: 99% 96% 96% 96%  Weight: 51.4 kg       Intake/Output Summary (Last 24 hours) at 08/27/2020 1349 Last data filed at 08/27/2020 0850 Gross per 24 hour  Intake 1080 ml  Output 700 ml  Net 380 ml   Filed Weights   08/25/20 0122 08/26/20 0503 08/27/20 0433  Weight: 51.4 kg 52 kg 51.4 kg    Examination: General exam: Appears comfortable  HEENT: PERRLA, oral mucosa moist, no sclera icterus or thrush Respiratory system: very poor air movement-mild wheezing- Respiratory effort normal. Cardiovascular system: S1 & S2 heard,  No murmurs  Gastrointestinal system: Abdomen soft, non-tender, nondistended. Normal bowel sounds   Central nervous system: Alert and oriented. No focal neurological deficits. Extremities: No cyanosis, clubbing or edema Skin: No rashes or ulcers Psychiatry:  Mood & affect appropriate.    Data Reviewed: I have personally reviewed following labs and imaging studies  CBC: Recent Labs  Lab 08/21/20 1406 08/22/20 0038  WBC 5.7 4.1  NEUTROABS 4.7  --   HGB 12.2 10.3*  HCT 35.6* 30.3*  MCV 94.7 94.7  PLT 191 [id number]*   Basic Metabolic Panel: Recent Labs  Lab 08/21/20 1406 08/22/20 0038 08/25/20 0236 08/26/20 0424 08/27/20 0320  NA 126* 133*  --  137 135  K 3.1* 3.3*  --  3.1* 3.5  CL 85* 96*  --  101 101  CO2 29 27  --  27 25  GLUCOSE 157* 112*  --  135* 136*  BUN 21 13  --  9 8  CREATININE 0.93 0.74  --  0.48 0.45  CALCIUM 8.3* 7.9*  --  8.1* 7.9*  MG  --  1.9 2.1 2.1  --   PHOS  --   --   --  1.3* 2.2*   GFR: Estimated Creatinine Clearance: 56.1 mL/min (by C-G formula based on SCr of 0.45  mg/dL). Liver Function Tests: Recent Labs  Lab 08/21/20 1406 08/22/20 0038  AST 28 26  ALT 23 20  ALKPHOS 66 53  BILITOT 0.7 0.7  PROT 6.4* 5.6*  ALBUMIN 3.0* 2.6*   No results for input(s): LIPASE, AMYLASE in the last 168 hours. No results for input(s): AMMONIA in the last 168 hours. Coagulation Profile: No results for input(s): INR, PROTIME in the last 168 hours. Cardiac Enzymes: No results for input(s): CKTOTAL, CKMB, CKMBINDEX, TROPONINI in the last 168 hours. BNP (last 3 results) No results for input(s): PROBNP in the last 8760 hours. HbA1C: No results for input(s): HGBA1C in the last 72 hours. CBG: No results for input(s): GLUCAP in the last 168 hours. Lipid Profile: No results for input(s): CHOL, HDL, LDLCALC, TRIG, CHOLHDL, LDLDIRECT in the last 72 hours. Thyroid Function Tests: No results for input(s): TSH, T4TOTAL, FREET4, T3FREE, THYROIDAB in the last 72 hours. Anemia Panel: No results for input(s): VITAMINB12, FOLATE, FERRITIN, TIBC, IRON, RETICCTPCT in the last 72 hours. Urine analysis:    Component Value Date/Time   COLORURINE YELLOW 08/21/2020 1822   APPEARANCEUR HAZY (A) 08/21/2020 1822   LABSPEC 1.013 08/21/2020 1822   PHURINE 6.0 08/21/2020 1822   GLUCOSEU 150 (A) 08/21/2020 1822   HGBUR SMALL (A) 08/21/2020 1822   BILIRUBINUR NEGATIVE 08/21/2020 1822   KETONESUR 5 (A) 08/21/2020 1822   PROTEINUR 100 (A) 08/21/2020 1822   UROBILINOGEN 1.0 09/20/2013 1621   NITRITE NEGATIVE 08/21/2020 1822   LEUKOCYTESUR NEGATIVE 08/21/2020 1822   Sepsis Labs: @LABRCNTIP (procalcitonin:4,lacticidven:4) ) Recent Results (from the past 240 hour(s))  Resp Panel by RT-PCR (Flu A&B, Covid) Nasopharyngeal Swab     Status: None   Collection Time: 08/21/20  2:07 PM   Specimen: Nasopharyngeal Swab; Nasopharyngeal(NP) swabs in vial transport medium  Result Value Ref Range Status   SARS Coronavirus 2 by RT PCR NEGATIVE NEGATIVE Final    Comment: (NOTE) SARS-CoV-2 target  nucleic acids are NOT DETECTED.  The SARS-CoV-2 RNA is generally detectable in upper respiratory specimens during the acute phase of infection. The lowest concentration of SARS-CoV-2 viral copies this assay can detect is 138 copies/mL. A negative result does not preclude SARS-Cov-2 infection and should not be used as the sole basis for treatment or other patient management decisions. A negative result may occur with  improper specimen collection/handling, submission of specimen other than nasopharyngeal swab, presence of viral mutation(s) within the areas targeted by this assay, and inadequate number of viral copies(<138 copies/mL). A negative result must be combined with clinical observations, patient history, and epidemiological information. The expected result is Negative.  Fact Sheet for Patients:  EntrepreneurPulse.com.au  Fact Sheet for Healthcare Providers:  IncredibleEmployment.be  This test is no t yet approved or cleared by the [place] FDA and  has been authorized for detection and/or diagnosis of SARS-CoV-2 by FDA under an Emergency Use Authorization (EUA). This EUA will remain  in effect (meaning this test can be used) for the duration of the COVID-19 declaration under Section 564(b)(1) of the Act, 21 U.S.C.section 360bbb-3(b)(1), unless the authorization is terminated  or revoked sooner.       Influenza A by PCR NEGATIVE NEGATIVE Final   Influenza B by PCR NEGATIVE NEGATIVE Final    Comment: (NOTE) The Xpert Xpress SARS-CoV-2/FLU/RSV plus assay is intended as an aid in the diagnosis of influenza from Nasopharyngeal swab specimens and should not be used as a sole basis for treatment. Nasal washings and aspirates are unacceptable for Xpert Xpress SARS-CoV-2/FLU/RSV testing.  Fact Sheet for Patients: EntrepreneurPulse.com.au  Fact Sheet for Healthcare  Providers: IncredibleEmployment.be  This test is not yet approved or cleared by the [place]  FDA and has been authorized for detection and/or diagnosis of SARS-CoV-2 by FDA under an Emergency Use Authorization (EUA). This EUA will remain in effect (meaning this test can be used) for the duration of the COVID-19 declaration under Section 564(b)(1) of the Act, 21 U.S.C. section 360bbb-3(b)(1), unless the authorization is terminated or revoked.  Performed at [facility], [address] [place], [place] [address]   Blood Culture (routine x 2)     Status: Abnormal   Collection Time: 08/21/20  4:23 PM   Specimen: BLOOD  Result Value Ref Range Status   Specimen Description BLOOD SITE NOT SPECIFIED  Final   Special Requests   Final    BOTTLES DRAWN AEROBIC AND ANAEROBIC Blood Culture adequate volume   Culture  Setup Time   Final    AEROBIC BOTTLE ONLY GRAM POSITIVE COCCI Organism ID to follow CRITICAL RESULT CALLED TO, READ BACK BY AND VERIFIED WITH: T DANG PHARMD 08/22/20 2236 JDW    Culture (A)  Final    STREPTOCOCCUS SANGUINIS THE SIGNIFICANCE OF ISOLATING THIS ORGANISM FROM A SINGLE SET OF BLOOD CULTURES WHEN MULTIPLE SETS ARE DRAWN IS UNCERTAIN. PLEASE NOTIFY THE MICROBIOLOGY DEPARTMENT WITHIN ONE WEEK IF SPECIATION AND SENSITIVITIES ARE REQUIRED. Performed at [facility], [address] [place], [place] [address]    Report Status 08/24/2020 FINAL  Final  Blood Culture ID Panel (Reflexed)     Status: Abnormal   Collection Time: 08/21/20  4:23 PM  Result Value Ref Range Status   Enterococcus faecalis NOT DETECTED NOT DETECTED Final   Enterococcus Faecium NOT DETECTED NOT DETECTED Final   Listeria monocytogenes NOT DETECTED NOT DETECTED Final   Staphylococcus species NOT DETECTED NOT DETECTED Final   Staphylococcus aureus (BCID) NOT DETECTED NOT DETECTED Final   Staphylococcus epidermidis NOT DETECTED NOT DETECTED Final   Staphylococcus  lugdunensis NOT DETECTED NOT DETECTED Final   Streptococcus species DETECTED (A) NOT DETECTED Final    Comment: Not Enterococcus species, Streptococcus agalactiae, Streptococcus pyogenes, or Streptococcus pneumoniae. CRITICAL RESULT CALLED TO, READ BACK BY AND VERIFIED WITH: T DANG PHARMD 08/22/20 2236 JDW    Streptococcus agalactiae NOT DETECTED NOT DETECTED Final   Streptococcus pneumoniae NOT DETECTED NOT DETECTED Final   Streptococcus pyogenes NOT DETECTED NOT DETECTED Final   A.calcoaceticus-baumannii NOT DETECTED NOT DETECTED Final   Bacteroides fragilis NOT DETECTED NOT DETECTED Final   Enterobacterales NOT DETECTED NOT DETECTED Final   Enterobacter cloacae complex NOT DETECTED NOT DETECTED Final   Escherichia coli NOT DETECTED NOT DETECTED Final   Klebsiella aerogenes NOT DETECTED NOT DETECTED Final   Klebsiella oxytoca NOT DETECTED NOT DETECTED Final   Klebsiella pneumoniae NOT DETECTED NOT DETECTED Final   Proteus species NOT DETECTED NOT DETECTED Final   Salmonella species NOT DETECTED NOT DETECTED Final   Serratia marcescens NOT DETECTED NOT DETECTED Final   Haemophilus influenzae NOT DETECTED NOT DETECTED Final   Neisseria meningitidis NOT DETECTED NOT DETECTED Final   Pseudomonas aeruginosa NOT DETECTED NOT DETECTED Final   Stenotrophomonas maltophilia NOT DETECTED NOT DETECTED Final   Candida albicans NOT DETECTED NOT DETECTED Final   Candida auris NOT DETECTED NOT DETECTED Final   Candida glabrata NOT DETECTED NOT DETECTED Final   Candida krusei NOT DETECTED NOT DETECTED Final   Candida parapsilosis NOT DETECTED NOT DETECTED Final   Candida tropicalis NOT DETECTED NOT DETECTED Final   Cryptococcus neoformans/gattii NOT DETECTED NOT DETECTED Final    Comment: Performed at [facility],  [address] [place], [place] [address]  Blood Culture (routine x 2)     Status: None   Collection Time: 08/22/20 12:38 AM   Specimen: BLOOD RIGHT HAND  Result Value Ref Range  Status   Specimen Description BLOOD RIGHT HAND  Final   Special Requests   Final    BOTTLES DRAWN AEROBIC AND ANAEROBIC Blood Culture adequate volume   Culture   Final    NO GROWTH 5 DAYS Performed at [facility], [place] [address] [place], [place] [address]    Report Status 08/27/2020 FINAL  Final  Respiratory Panel by PCR     Status: None   Collection Time: 08/22/20  1:02 PM   Specimen: Nasopharyngeal Swab; Respiratory  Result Value Ref Range Status   Adenovirus NOT DETECTED NOT DETECTED Final   Coronavirus 229E NOT DETECTED NOT DETECTED Final    Comment: (NOTE) The Coronavirus on the Respiratory Panel, DOES NOT test for the novel  Coronavirus (2019 nCoV)    Coronavirus HKU1 NOT DETECTED NOT DETECTED Final   Coronavirus NL63 NOT DETECTED NOT DETECTED Final   Coronavirus OC43 NOT DETECTED NOT DETECTED Final   Metapneumovirus NOT DETECTED NOT DETECTED Final   Rhinovirus / Enterovirus NOT DETECTED NOT DETECTED Final   Influenza A NOT DETECTED NOT DETECTED Final   Influenza B NOT DETECTED NOT DETECTED Final   Parainfluenza Virus 1 NOT DETECTED NOT DETECTED Final   Parainfluenza Virus 2 NOT DETECTED NOT DETECTED Final   Parainfluenza Virus 3 NOT DETECTED NOT DETECTED Final   Parainfluenza Virus 4 NOT DETECTED NOT DETECTED Final   Respiratory Syncytial Virus NOT DETECTED NOT DETECTED Final   Bordetella pertussis NOT DETECTED NOT DETECTED Final   Bordetella Parapertussis NOT DETECTED NOT DETECTED Final   Chlamydophila pneumoniae NOT DETECTED NOT DETECTED Final   Mycoplasma pneumoniae NOT DETECTED NOT DETECTED Final    Comment: Performed at [facility], [place] [address], [place], [place] [address]         Radiology Studies: No results found.    Scheduled Meds: . arformoterol  15 mcg Nebulization BID  . aspirin EC  81 mg Oral Daily  . budesonide (PULMICORT) nebulizer solution  0.5 mg Nebulization BID  . diltiazem  90 mg Oral BID  . enoxaparin (LOVENOX) injection   40 mg Subcutaneous Q24H  . feeding supplement  237 mL Oral BID BM  . guaiFENesin  600 mg Oral BID  . hydroxyurea  500 mg Oral QODAY  . levothyroxine  88 mcg Oral QAC breakfast  . metoprolol succinate  12.5 mg Oral Daily  . multivitamin with minerals  1 tablet Oral Daily  . phosphorus  500 mg Oral BID  . predniSONE  50 mg Oral Q breakfast  . revefenacin  175 mcg Nebulization Daily  . rosuvastatin  40 mg Oral Daily   Continuous Infusions:    LOS: 6 days      Debbe Odea, MD [facility] Pager: [email or web address] 08/27/2020, 1:49 PM

## 2020-08-28 DIAGNOSIS — D473 Essential (hemorrhagic) thrombocythemia: Secondary | ICD-10-CM

## 2020-08-28 DIAGNOSIS — Z720 Tobacco use: Secondary | ICD-10-CM

## 2020-08-28 LAB — CREATININE, SERUM
Creatinine, Ser: 0.57 mg/dL (ref 0.44–1.00)
GFR, Estimated: >60 mL/min (ref >60)

## 2020-08-28 LAB — ALPHA-1 ANTITRYPSIN PHENOTYPE: A-1 Antitrypsin, Ser: 155 mg/dL (ref 101–187)

## 2020-08-28 LAB — PHOSPHORUS: Phosphorus: 2.4 mg/dL — ABNORMAL LOW (ref 2.5–4.6)

## 2020-08-28 MED ORDER — ENSURE ENLIVE PO LIQD: 237.0000 mL | Freq: Two times a day (BID) | ORAL | 12 refills | Status: DC

## 2020-08-28 MED ORDER — ALBUTEROL SULFATE (2.5 MG/3ML) 0.083% IN NEBU: 2.5000 mg | INHALATION_SOLUTION | Freq: Four times a day (QID) | RESPIRATORY_TRACT | 12 refills | Status: DC | PRN

## 2020-08-28 MED ORDER — BUDESONIDE 0.5 MG/2ML IN SUSP: 0.5000 mg | Freq: Two times a day (BID) | RESPIRATORY_TRACT | 0 refills | Status: DC

## 2020-08-28 MED ORDER — METOPROLOL SUCCINATE ER 25 MG PO TB24
25.0000 mg | ORAL_TABLET | Freq: Every day | ORAL | Status: DC
Start: 2020-08-28 — End: 2020-08-28
  Administered 2020-08-28: 25 mg via ORAL
  Filled 2020-08-28: qty 1

## 2020-08-28 MED ORDER — PREDNISONE 50 MG PO TABS: ORAL_TABLET | ORAL | 0 refills | Status: DC

## 2020-08-28 MED ORDER — ACETAMINOPHEN 325 MG PO TABS: 650.0000 mg | ORAL_TABLET | Freq: Four times a day (QID) | ORAL | Status: DC | PRN

## 2020-08-28 MED ORDER — GUAIFENESIN ER 600 MG PO TB12: 600.0000 mg | ORAL_TABLET | Freq: Two times a day (BID) | ORAL | 0 refills | Status: DC

## 2020-08-28 MED ORDER — POLYETHYLENE GLYCOL 3350 17 G PO PACK: 17.0000 g | PACK | Freq: Every day | ORAL | 0 refills | Status: DC | PRN

## 2020-08-28 MED ORDER — REVEFENACIN 175 MCG/3ML IN SOLN: 175.0000 ug | Freq: Every day | RESPIRATORY_TRACT | 0 refills | Status: DC

## 2020-08-28 MED ORDER — ARFORMOTEROL TARTRATE 15 MCG/2ML IN NEBU: 15.0000 ug | INHALATION_SOLUTION | Freq: Two times a day (BID) | RESPIRATORY_TRACT | 0 refills | Status: DC

## 2020-08-28 NOTE — Progress Notes (Signed)
Nutrition Follow-up  DOCUMENTATION CODES:   Severe malnutrition in context of chronic illness  INTERVENTION:   -Continue Ensure Enlive po BID, each supplement provides 350 kcal and 20 grams of protein -Continue Magic cup BID with meals, each supplement provides 290 kcal and 9 grams of protein -Continue MVI with minerals daily  NUTRITION DIAGNOSIS:   Severe Malnutrition related to chronic illness (COPD) as evidenced by severe muscle depletion,moderate muscle depletion,severe fat depletion,energy intake < or equal to 75% for > or equal to 1 month.  Ongoing  GOAL:   Patient will meet greater than or equal to 90% of their needs  Progressing   MONITOR:   PO intake,Supplement acceptance,Labs,Weight trends  REASON FOR ASSESSMENT:   Consult Assessment of nutrition requirement/status  ASSESSMENT:   64 y.o. female with PMH of PNA, HTN, CVA, COPD/emphysema is on 2 L nasal cannula at baseline, and tobacco abuse. Recently hospitalized for COPD exacerbation. Admitted for evaluation of generalized weakness, worsening dyspnea, and cough  Reviewed I/O's: -2.3 L x 24 hours and -6.3 L since admission  UOP: 3.9 L x 24 hours  Attempted to speak with pt via call to hospital room, however, unable to reach.   Pt's intake has improved since last visit. Noted meal completion 50-100%. Pt has refused the last 3 Ensure Enlive supplements.   Medications reviewed and include prednisone.  Per TOC notes, plan to discharge home today once DME is arranged.   Labs reviewed: Phos: 2.4, Mg and K WDL.   Diet Order:   Diet Order            Diet general           Diet Heart Room service appropriate? Yes; Fluid consistency: Thin  Diet effective now                 EDUCATION NEEDS:   Education needs have been addressed  Skin:  Skin Assessment: Reviewed RN Assessment  Last BM:  08/27/20  Height:   Ht Readings from Last 1 Encounters:  07/12/20 5\' 4"  (1.626 m)    Weight:   Wt  Readings from Last 1 Encounters:  08/28/20 50.3 kg    Ideal Body Weight:  54.5 kg  BMI:  Body mass index is 19.05 kg/m.  Estimated Nutritional Needs:   Kcal:  1500-1700 kcal  Protein:  75-85 grams  Fluid:  >1.5 L/day    08/30/20, RD, LDN, CDCES Registered Dietitian II Certified Diabetes Care and Education Specialist Please refer to [facility] for RD and/or RD on-call/weekend/after hours pager

## 2020-08-28 NOTE — Discharge Summary (Addendum)
Physician Discharge Summary  Cheryl Haley ZJI:[id number] DOB: 08/08/1956 DOA: 08/21/2020  PCP: Catha Gosselin, MD  Admit date: 08/21/2020 Discharge date: 08/28/2020  Admitted From: home  Disposition:  home   Recommendations for Outpatient Follow-up:  1. F/u to determine if she needs chronic Prednisone 2. Please f/u on K, Mg and Phos as outpatient  Home Health:  ordered  Discharge Condition:  stable   CODE STATUS:  Full code   Diet recommendation:  Heart healthy Consultations:  Pulmonary  Procedures/Studies: . none   Discharge Diagnoses:  Principal Problem:   Acute on chronic respiratory failure with hypoxia and hypercapnia (HCC) Active Problems:   COPD (chronic obstructive pulmonary disease) with emphysema (HCC)   HTN (hypertension)   Hypothyroidism   Tobacco abuse   Chronic respiratory failure with hypoxia (HCC)   COPD exacerbation (HCC)   Essential thrombocythemia (HCC)   Hyponatremia   Hypokalemia    Hypophosphatemia     Brief Summary: Cheryl Haley is a64 y.o.year-old w/ hx of tobacco abuse, PNA, HTN, HL, hx CVA/ TIA, COPD/ emphysema brought to ED by EMS for eval of gen'd weakness, SOB and cough. Seen at outside hospital recently and told may have COPD exacerbation, she rec'd Rx for prednisone for 10 days. She took the medication and was feeling a little better , but then again felt worsening dyspnea.  She presented to the ED and was found to have a temp of 101.6 and dyspnea. Given Vanc and Cefepime and admitted to the hospital.   Hospital Course:  Principal Problem: Acute on chronic respiratory failure with fever /   COPD (chronic obstructive pulmonary disease) with emphysema  - suspect exacerbation due to viral infection - CT of the chest ordered to look for pneumonia and is unrevealing for an infection- No PE found either - appreciate pulmonary eval- She was started on Solumedrol on 12/23 - She tried to use the CPAP but could only tolerate it for 2  hrs.  - added Mucinex and flutter valve for increasing sputum - cont steroids and wean over the next 3-4 weeks per pulmonary recommendation - I have ordered a Nebulizer machine for her as she does not have one at home - she is feeling significantly better now and is stable for dc home- cont 2 L o2  Active Problems:  Sepsis-  likely viral lung infection > blood culture 1/4 reveals Strep sanguinous started on Ceftriaxone on 12/22 - Temp 100.5 on 12/22 and has not recurred since - ID feels it may be a contaminant and Ceftriaxone was stopped on 12/23- we have been following for fevers- none so far   Sinus tachycardia  -home Cardizem and Toprol were on hold for low BPs- HR improved once resumed and once dyspnea improved  HTN - cont home meds  Hypovolemia - Improved with IVF which have been stopped   Cachexia (? Related to COPD) - added dietary supplements- nutrition consulted - following appetite and oral intake  Hypokalemia- hypophosphatemia -have been replaced aggressively     Hypothyroidism -cont Synthroid    Tobacco abuse       Essential thrombocythemia - cont Hydroxyurea       Discharge Exam: Vitals:   08/28/20 0419 08/28/20 0754  BP: 134/74   Pulse: 75 76  Resp: 20 14  Temp: 97.9 F (36.6 C)   SpO2: 100% 98%   Vitals:   08/27/20 1928 08/27/20 2011 08/28/20 0419 08/28/20 0754  BP: 138/70  134/74   Pulse: 81  75 76  Resp: 20  20 14   Temp: 98.7 F (37.1 C)  97.9 F (36.6 C)   TempSrc: Oral  Oral   SpO2: 98% 98% 100% 98%  Weight:   50.3 kg     General: Pt is alert, awake, not in acute distress Cardiovascular: RRR, S1/S2 +, no rubs, no gallops Respiratory: CTA bilaterally, no wheezing, no rhonchi Abdominal: Soft, NT, ND, bowel sounds + Extremities: no edema, no cyanosis   Discharge Instructions  Discharge Instructions    Diet general   Complete by: As directed    Regular diet   For home use only DME Nebulizer machine   Complete by: As  directed    Patient needs a nebulizer to treat with the following condition: COPD exacerbation (HCC)   Length of Need: Lifetime   Increase activity slowly   Complete by: As directed      Allergies as of 08/28/2020   No Known Allergies     Medication List    TAKE these medications   acetaminophen 325 MG tablet Commonly known as: TYLENOL Take 2 tablets (650 mg total) by mouth every 6 (six) hours as needed for mild pain (or Fever >/= 101).   albuterol (2.5 MG/3ML) 0.083% nebulizer solution Commonly known as: PROVENTIL Take 3 mLs (2.5 mg total) by nebulization every 6 (six) hours as needed for wheezing or shortness of breath.   arformoterol 15 MCG/2ML Nebu Commonly known as: BROVANA Take 2 mLs (15 mcg total) by nebulization 2 (two) times daily.   aspirin EC 81 MG tablet Take 81 mg daily by mouth.   budesonide 0.5 MG/2ML nebulizer solution Commonly known as: PULMICORT Take 2 mLs (0.5 mg total) by nebulization 2 (two) times daily.   diltiazem 60 MG tablet Commonly known as: CARDIZEM Take 1 tablet (60 mg total) by mouth 2 (two) times daily.   Euthyrox 88 MCG tablet Generic drug: levothyroxine Take 88 mcg by mouth daily before breakfast.   feeding supplement Liqd Take 237 mLs by mouth 2 (two) times daily between meals.   guaiFENesin 600 MG 12 hr tablet Commonly known as: MUCINEX Take 1 tablet (600 mg total) by mouth 2 (two) times daily.   hydroxyurea 500 MG capsule Commonly known as: HYDREA Take 500 mg by mouth every other day.   losartan 50 MG tablet Commonly known as: COZAAR Take 50 mg by mouth daily.   metoprolol succinate 25 MG 24 hr tablet Commonly known as: TOPROL-XL Take 25 mg by mouth daily.   mometasone-formoterol 100-5 MCG/ACT Aero Commonly known as: DULERA Inhale 2 puffs into the lungs 2 (two) times daily.   multivitamin tablet Take 1 tablet by mouth daily.   OXYGEN Inhale 2 L into the lungs continuous.   polyethylene glycol 17 g  packet Commonly known as: MIRALAX / GLYCOLAX Take 17 g by mouth daily as needed for mild constipation.   predniSONE 50 MG tablet Commonly known as: DELTASONE Taper as follows 50 mg x 3 more days 40 mg x 5 days 30 mg x 5 days 20 mg x 5 days 10 mg x 5 days 5 mg x 5 days   revefenacin 175 MCG/3ML nebulizer solution Commonly known as: YUPELRI Take 3 mLs (175 mcg total) by nebulization daily. Start taking on: August 29, 2020   rosuvastatin 40 MG tablet Commonly known as: CRESTOR Take 1 tablet (40 mg total) by mouth daily.   Spiriva HandiHaler 18 MCG inhalation capsule Generic drug: tiotropium Place 1 capsule (18 mcg total) into inhaler and  inhale daily.            Durable Medical Equipment  (From admission, onward)         Start     Ordered   08/28/20 0000  For home use only DME Nebulizer machine       Question Answer Comment  Patient needs a nebulizer to treat with the following condition COPD exacerbation (HCC)   Length of Need Lifetime      08/28/20 0949          No Known Allergies    DG Chest 2 View  Result Date: 08/21/2020 CLINICAL DATA:  Cough EXAM: CHEST - 2 VIEW COMPARISON:  CT 06/28/2020.  Radiography 04/18/2019. FINDINGS: Heart size is normal. Chronic aortic atherosclerosis. Chronic pulmonary emphysema with hyperinflation and interstitial scarring. No sign of focal infiltrate, mass, effusion or collapse. IMPRESSION: Emphysema. No active process evident. Aortic atherosclerosis. Electronically Signed   By: Paulina Fusi M.D.   On: 08/21/2020 14:31   CT ANGIO CHEST PE W OR WO CONTRAST  Result Date: 08/22/2020 CLINICAL DATA:  64 year old female with concern for pulmonary embolism. EXAM: CT ANGIOGRAPHY CHEST WITH CONTRAST TECHNIQUE: Multidetector CT imaging of the chest was performed using the standard protocol during bolus administration of intravenous contrast. Multiplanar CT image reconstructions and MIPs were obtained to evaluate the vascular anatomy.  CONTRAST:  51mL OMNIPAQUE IOHEXOL 350 MG/ML SOLN COMPARISON:  Chest CT dated 06/28/2020. FINDINGS: Cardiovascular: There is no cardiomegaly or pericardial effusion. There is coronary vascular calcification. Moderate atherosclerotic calcification of the thoracic aorta. No aneurysmal dilatation or dissection. The origins of the great vessels of the aortic arch appear patent as visualized. Evaluation of the pulmonary arteries is somewhat limited due to respiratory motion artifact. No pulmonary artery embolus identified. Mediastinum/Nodes: There is no hilar or mediastinal adenopathy. The esophagus is grossly unremarkable. No mediastinal fluid collection. Lungs/Pleura: Severe emphysema. There are bibasilar linear atelectasis/scarring. There is a 9 mm nodule in the left upper lobe and a 12 mm nodule along the minor fissure similar to prior CT. No new consolidation. There is no pleural effusion pneumothorax. The central airways are patent. Upper Abdomen: No acute abnormality. Musculoskeletal: Osteopenia with degenerative changes of the spine. No acute osseous pathology. Review of the MIP images confirms the above findings. IMPRESSION: 1. No acute intrathoracic pathology. No CT evidence of pulmonary embolism. 2. Bilateral pulmonary nodules as seen on the prior CT. Given background of severe emphysema consider annual low dose CT cancer screening follow-up as per recommendation of prior CT. 3. Aortic Atherosclerosis (ICD10-I70.0) and Emphysema (ICD10-J43.9). Electronically Signed   By: Elgie Collard M.D.   On: 08/22/2020 15:30   ECHOCARDIOGRAM COMPLETE  Result Date: 08/24/2020    ECHOCARDIOGRAM REPORT   Patient Name:   Cheryl Haley Date of Exam: 08/24/2020 Medical Rec #:  [id number]       Height:       64.0 in Accession #:    [id number]      Weight:       109.3 lb Date of Birth:  01-Nov-1955      BSA:          1.513 m Patient Age:    64 years        BP:           123/65 mmHg Patient Gender: F               HR:            117 bpm. Exam  Location:  Inpatient Procedure: 2D Echo, Color Doppler and Cardiac Doppler Indications:    Acute Respiratory Distress R06.03  History:        Patient has no prior history of Echocardiogram examinations.                 COPD; Risk Factors:Hypertension, Dyslipidemia and Former Smoker.  Sonographer:    Irving BurtonEmily Senior RDCS Referring Phys: [phone] [facility] RAM[facility]WAMY  Sonographer Comments: Scanned upright due to dyspnea. IMPRESSIONS  1. Left ventricular ejection fraction, by estimation, is 55 to 60%. The left ventricle has normal function. The left ventricle has no regional wall motion abnormalities. Left ventricular diastolic parameters are indeterminate.  2. Right ventricular systolic function is normal. The right ventricular size is normal.  3. The mitral valve is normal in structure. No evidence of mitral valve regurgitation. No evidence of mitral stenosis. Moderate mitral annular calcification.  4. The aortic valve was not well visualized. There is mild calcification of the aortic valve. There is mild thickening of the aortic valve. Aortic valve regurgitation is not visualized. No aortic stenosis is present.  5. The inferior vena cava is normal in size with greater than 50% respiratory variability, suggesting right atrial pressure of 3 mmHg. FINDINGS  Left Ventricle: Left ventricular ejection fraction, by estimation, is 55 to 60%. The left ventricle has normal function. The left ventricle has no regional wall motion abnormalities. The left ventricular internal cavity size was normal in size. There is  no left ventricular hypertrophy. Left ventricular diastolic parameters are indeterminate. Right Ventricle: The right ventricular size is normal. No increase in right ventricular wall thickness. Right ventricular systolic function is normal. Left Atrium: Left atrial size was normal in size. Right Atrium: Right atrial size was normal in size. Pericardium: There is no evidence of pericardial effusion. Mitral  Valve: The mitral valve is normal in structure. There is mild thickening of the mitral valve leaflet(s). There is mild calcification of the mitral valve leaflet(s). Moderate mitral annular calcification. No evidence of mitral valve regurgitation. No evidence of mitral valve stenosis. Tricuspid Valve: The tricuspid valve is normal in structure. Tricuspid valve regurgitation is not demonstrated. No evidence of tricuspid stenosis. Aortic Valve: The aortic valve was not well visualized. There is mild calcification of the aortic valve. There is mild thickening of the aortic valve. There is mild aortic valve annular calcification. Aortic valve regurgitation is not visualized. No aortic stenosis is present. Aortic valve mean gradient measures 4.6 mmHg. Aortic valve peak gradient measures 8.9 mmHg. Aortic valve area, by VTI measures 3.45 cm. Pulmonic Valve: The pulmonic valve was not well visualized. Pulmonic valve regurgitation is not visualized. No evidence of pulmonic stenosis. Aorta: The aortic root is normal in size and structure. Pulmonary Artery: Indeterminant PASP, inadequate TR jet. Venous: The inferior vena cava is normal in size with greater than 50% respiratory variability, suggesting right atrial pressure of 3 mmHg. IAS/Shunts: No atrial level shunt detected by color flow Doppler.  LEFT VENTRICLE PLAX 2D LVIDd:         3.80 cm LVIDs:         2.70 cm LV PW:         1.00 cm LV IVS:        0.90 cm LVOT diam:     1.90 cm LV SV:         85 LV SV Index:   56 LVOT Area:     2.84 cm  RIGHT VENTRICLE RV S prime:  14.00 cm/s TAPSE (M-mode): 2.0 cm LEFT ATRIUM             Index       RIGHT ATRIUM           Index LA diam:        3.20 cm 2.11 cm/m  RA Area:     10.50 cm LA Vol (A2C):   43.7 ml 28.88 ml/m RA Volume:   22.60 ml  14.93 ml/m LA Vol (A4C):   18.7 ml 12.36 ml/m LA Biplane Vol: 30.8 ml 20.35 ml/m  AORTIC VALVE AV Area (Vmax):    2.60 cm AV Area (Vmean):   2.96 cm AV Area (VTI):     3.45 cm AV Vmax:            149.31 cm/s AV Vmean:          100.407 cm/s AV VTI:            0.247 m AV Peak Grad:      8.9 mmHg AV Mean Grad:      4.6 mmHg LVOT Vmax:         137.00 cm/s LVOT Vmean:        105.000 cm/s LVOT VTI:          0.301 m LVOT/AV VTI ratio: 1.22  AORTA Ao Root diam: 2.70 cm Ao Asc diam:  3.10 cm  SHUNTS Systemic VTI:  0.30 m Systemic Diam: 1.90 cm Carlyle Dolly MD Electronically signed by Carlyle Dolly MD Signature Date/Time: 08/24/2020/10:51:51 AM    Final       The results of significant diagnostics from this hospitalization (including imaging, microbiology, ancillary and laboratory) are listed below for reference.     Microbiology: Recent Results (from the past 240 hour(s))  Resp Panel by RT-PCR (Flu A&B, Covid) Nasopharyngeal Swab     Status: None   Collection Time: 08/21/20  2:07 PM   Specimen: Nasopharyngeal Swab; Nasopharyngeal(NP) swabs in vial transport medium  Result Value Ref Range Status   SARS Coronavirus 2 by RT PCR NEGATIVE NEGATIVE Final    Comment: (NOTE) SARS-CoV-2 target nucleic acids are NOT DETECTED.  The SARS-CoV-2 RNA is generally detectable in upper respiratory specimens during the acute phase of infection. The lowest concentration of SARS-CoV-2 viral copies this assay can detect is 138 copies/mL. A negative result does not preclude SARS-Cov-2 infection and should not be used as the sole basis for treatment or other patient management decisions. A negative result may occur with  improper specimen collection/handling, submission of specimen other than nasopharyngeal swab, presence of viral mutation(s) within the areas targeted by this assay, and inadequate number of viral copies(<138 copies/mL). A negative result must be combined with clinical observations, patient history, and epidemiological information. The expected result is Negative.  Fact Sheet for Patients:  EntrepreneurPulse.com.au  Fact Sheet for Healthcare Providers:   IncredibleEmployment.be  This test is no t yet approved or cleared by the [place] FDA and  has been authorized for detection and/or diagnosis of SARS-CoV-2 by FDA under an Emergency Use Authorization (EUA). This EUA will remain  in effect (meaning this test can be used) for the duration of the COVID-19 declaration under Section 564(b)(1) of the Act, 21 U.S.C.section 360bbb-3(b)(1), unless the authorization is terminated  or revoked sooner.       Influenza A by PCR NEGATIVE NEGATIVE Final   Influenza B by PCR NEGATIVE NEGATIVE Final    Comment: (NOTE) The Xpert Xpress SARS-CoV-2/FLU/RSV plus assay is intended  as an aid in the diagnosis of influenza from Nasopharyngeal swab specimens and should not be used as a sole basis for treatment. Nasal washings and aspirates are unacceptable for Xpert Xpress SARS-CoV-2/FLU/RSV testing.  Fact Sheet for Patients: BloggerCourse.com  Fact Sheet for Healthcare Providers: SeriousBroker.it  This test is not yet approved or cleared by the [place] FDA and has been authorized for detection and/or diagnosis of SARS-CoV-2 by FDA under an Emergency Use Authorization (EUA). This EUA will remain in effect (meaning this test can be used) for the duration of the COVID-19 declaration under Section 564(b)(1) of the Act, 21 U.S.C. section 360bbb-3(b)(1), unless the authorization is terminated or revoked.  Performed at [facility], [address] [place], [place] [address]   Blood Culture (routine x 2)     Status: Abnormal   Collection Time: 08/21/20  4:23 PM   Specimen: BLOOD  Result Value Ref Range Status   Specimen Description BLOOD SITE NOT SPECIFIED  Final   Special Requests   Final    BOTTLES DRAWN AEROBIC AND ANAEROBIC Blood Culture adequate volume   Culture  Setup Time   Final    AEROBIC BOTTLE ONLY GRAM POSITIVE COCCI Organism ID to follow CRITICAL RESULT  CALLED TO, READ BACK BY AND VERIFIED WITH: T DANG PHARMD 08/22/20 2236 JDW    Culture (A)  Final    STREPTOCOCCUS SANGUINIS THE SIGNIFICANCE OF ISOLATING THIS ORGANISM FROM A SINGLE SET OF BLOOD CULTURES WHEN MULTIPLE SETS ARE DRAWN IS UNCERTAIN. PLEASE NOTIFY THE MICROBIOLOGY DEPARTMENT WITHIN ONE WEEK IF SPECIATION AND SENSITIVITIES ARE REQUIRED. Performed at [facility], [address] [place], [place] [address]    Report Status 08/24/2020 FINAL  Final  Blood Culture ID Panel (Reflexed)     Status: Abnormal   Collection Time: 08/21/20  4:23 PM  Result Value Ref Range Status   Enterococcus faecalis NOT DETECTED NOT DETECTED Final   Enterococcus Faecium NOT DETECTED NOT DETECTED Final   Listeria monocytogenes NOT DETECTED NOT DETECTED Final   Staphylococcus species NOT DETECTED NOT DETECTED Final   Staphylococcus aureus (BCID) NOT DETECTED NOT DETECTED Final   Staphylococcus epidermidis NOT DETECTED NOT DETECTED Final   Staphylococcus lugdunensis NOT DETECTED NOT DETECTED Final   Streptococcus species DETECTED (A) NOT DETECTED Final    Comment: Not Enterococcus species, Streptococcus agalactiae, Streptococcus pyogenes, or Streptococcus pneumoniae. CRITICAL RESULT CALLED TO, READ BACK BY AND VERIFIED WITH: T DANG PHARMD 08/22/20 2236 JDW    Streptococcus agalactiae NOT DETECTED NOT DETECTED Final   Streptococcus pneumoniae NOT DETECTED NOT DETECTED Final   Streptococcus pyogenes NOT DETECTED NOT DETECTED Final   A.calcoaceticus-baumannii NOT DETECTED NOT DETECTED Final   Bacteroides fragilis NOT DETECTED NOT DETECTED Final   Enterobacterales NOT DETECTED NOT DETECTED Final   Enterobacter cloacae complex NOT DETECTED NOT DETECTED Final   Escherichia coli NOT DETECTED NOT DETECTED Final   Klebsiella aerogenes NOT DETECTED NOT DETECTED Final   Klebsiella oxytoca NOT DETECTED NOT DETECTED Final   Klebsiella pneumoniae NOT DETECTED NOT DETECTED Final   Proteus species NOT  DETECTED NOT DETECTED Final   Salmonella species NOT DETECTED NOT DETECTED Final   Serratia marcescens NOT DETECTED NOT DETECTED Final   Haemophilus influenzae NOT DETECTED NOT DETECTED Final   Neisseria meningitidis NOT DETECTED NOT DETECTED Final   Pseudomonas aeruginosa NOT DETECTED NOT DETECTED Final   Stenotrophomonas maltophilia NOT DETECTED NOT DETECTED Final   Candida albicans NOT DETECTED NOT DETECTED Final   Candida  auris NOT DETECTED NOT DETECTED Final   Candida glabrata NOT DETECTED NOT DETECTED Final   Candida krusei NOT DETECTED NOT DETECTED Final   Candida parapsilosis NOT DETECTED NOT DETECTED Final   Candida tropicalis NOT DETECTED NOT DETECTED Final   Cryptococcus neoformans/gattii NOT DETECTED NOT DETECTED Final    Comment: Performed at [facility], [address], [place], [place] [address]  Blood Culture (routine x 2)     Status: None   Collection Time: 08/22/20 12:38 AM   Specimen: BLOOD RIGHT HAND  Result Value Ref Range Status   Specimen Description BLOOD RIGHT HAND  Final   Special Requests   Final    BOTTLES DRAWN AEROBIC AND ANAEROBIC Blood Culture adequate volume   Culture   Final    NO GROWTH 5 DAYS Performed at [facility], [address] [place], [place] [address]    Report Status 08/27/2020 FINAL  Final  Respiratory Panel by PCR     Status: None   Collection Time: 08/22/20  1:02 PM   Specimen: Nasopharyngeal Swab; Respiratory  Result Value Ref Range Status   Adenovirus NOT DETECTED NOT DETECTED Final   Coronavirus 229E NOT DETECTED NOT DETECTED Final    Comment: (NOTE) The Coronavirus on the Respiratory Panel, DOES NOT test for the novel  Coronavirus (2019 nCoV)    Coronavirus HKU1 NOT DETECTED NOT DETECTED Final   Coronavirus NL63 NOT DETECTED NOT DETECTED Final   Coronavirus OC43 NOT DETECTED NOT DETECTED Final   Metapneumovirus NOT DETECTED NOT DETECTED Final   Rhinovirus / Enterovirus NOT DETECTED NOT DETECTED Final    Influenza A NOT DETECTED NOT DETECTED Final   Influenza B NOT DETECTED NOT DETECTED Final   Parainfluenza Virus 1 NOT DETECTED NOT DETECTED Final   Parainfluenza Virus 2 NOT DETECTED NOT DETECTED Final   Parainfluenza Virus 3 NOT DETECTED NOT DETECTED Final   Parainfluenza Virus 4 NOT DETECTED NOT DETECTED Final   Respiratory Syncytial Virus NOT DETECTED NOT DETECTED Final   Bordetella pertussis NOT DETECTED NOT DETECTED Final   Bordetella Parapertussis NOT DETECTED NOT DETECTED Final   Chlamydophila pneumoniae NOT DETECTED NOT DETECTED Final   Mycoplasma pneumoniae NOT DETECTED NOT DETECTED Final    Comment: Performed at [facility], [address], [place], [place] [address]     Labs: BNP (last 3 results) No results for input(s): BNP in the last 8760 hours. Basic Metabolic Panel: Recent Labs  Lab 08/21/20 1406 08/22/20 0038 08/25/20 0236 08/26/20 0424 08/27/20 0320 08/28/20 0440  NA 126* 133*  --  137 135  --   K 3.1* 3.3*  --  3.1* 3.5  --   CL 85* 96*  --  101 101  --   CO2 29 27  --  27 25  --   GLUCOSE 157* 112*  --  135* 136*  --   BUN 21 13  --  9 8  --   CREATININE 0.93 0.74  --  0.48 0.45 0.57  CALCIUM 8.3* 7.9*  --  8.1* 7.9*  --   MG  --  1.9 2.1 2.1  --   --   PHOS  --   --   --  1.3* 2.2* 2.4*   Liver Function Tests: Recent Labs  Lab 08/21/20 1406 08/22/20 0038  AST 28 26  ALT 23 20  ALKPHOS 66 53  BILITOT 0.7 0.7  PROT 6.4* 5.6*  ALBUMIN 3.0* 2.6*   No results  for input(s): LIPASE, AMYLASE in the last 168 hours. No results for input(s): AMMONIA in the last 168 hours. CBC: Recent Labs  Lab 08/21/20 1406 08/22/20 0038  WBC 5.7 4.1  NEUTROABS 4.7  --   HGB 12.2 10.3*  HCT 35.6* 30.3*  MCV 94.7 94.7  PLT 191 136*   Cardiac Enzymes: No results for input(s): CKTOTAL, CKMB, CKMBINDEX, TROPONINI in the last 168 hours. BNP: Invalid input(s): POCBNP CBG: No results for input(s): GLUCAP in the last 168 hours. D-Dimer No results for  input(s): DDIMER in the last 72 hours. Hgb A1c No results for input(s): HGBA1C in the last 72 hours. Lipid Profile No results for input(s): CHOL, HDL, LDLCALC, TRIG, CHOLHDL, LDLDIRECT in the last 72 hours. Thyroid function studies No results for input(s): TSH, T4TOTAL, T3FREE, THYROIDAB in the last 72 hours.  Invalid input(s): FREET3 Anemia work up No results for input(s): VITAMINB12, FOLATE, FERRITIN, TIBC, IRON, RETICCTPCT in the last 72 hours. Urinalysis    Component Value Date/Time   COLORURINE YELLOW 08/21/2020 1822   APPEARANCEUR HAZY (A) 08/21/2020 1822   LABSPEC 1.013 08/21/2020 1822   PHURINE 6.0 08/21/2020 1822   GLUCOSEU 150 (A) 08/21/2020 1822   HGBUR SMALL (A) 08/21/2020 1822   BILIRUBINUR NEGATIVE 08/21/2020 1822   KETONESUR 5 (A) 08/21/2020 1822   PROTEINUR 100 (A) 08/21/2020 1822   UROBILINOGEN 1.0 09/20/2013 1621   NITRITE NEGATIVE 08/21/2020 1822   LEUKOCYTESUR NEGATIVE 08/21/2020 1822   Sepsis Labs Invalid input(s): PROCALCITONIN,  WBC,  LACTICIDVEN Microbiology Recent Results (from the past 240 hour(s))  Resp Panel by RT-PCR (Flu A&B, Covid) Nasopharyngeal Swab     Status: None   Collection Time: 08/21/20  2:07 PM   Specimen: Nasopharyngeal Swab; Nasopharyngeal(NP) swabs in vial transport medium  Result Value Ref Range Status   SARS Coronavirus 2 by RT PCR NEGATIVE NEGATIVE Final    Comment: (NOTE) SARS-CoV-2 target nucleic acids are NOT DETECTED.  The SARS-CoV-2 RNA is generally detectable in upper respiratory specimens during the acute phase of infection. The lowest concentration of SARS-CoV-2 viral copies this assay can detect is 138 copies/mL. A negative result does not preclude SARS-Cov-2 infection and should not be used as the sole basis for treatment or other patient management decisions. A negative result may occur with  improper specimen collection/handling, submission of specimen other than nasopharyngeal swab, presence of viral mutation(s)  within the areas targeted by this assay, and inadequate number of viral copies(<138 copies/mL). A negative result must be combined with clinical observations, patient history, and epidemiological information. The expected result is Negative.  Fact Sheet for Patients:  BloggerCourse.com  Fact Sheet for Healthcare Providers:  SeriousBroker.it  This test is no t yet approved or cleared by the [place] FDA and  has been authorized for detection and/or diagnosis of SARS-CoV-2 by FDA under an Emergency Use Authorization (EUA). This EUA will remain  in effect (meaning this test can be used) for the duration of the COVID-19 declaration under Section 564(b)(1) of the Act, 21 U.S.C.section 360bbb-3(b)(1), unless the authorization is terminated  or revoked sooner.       Influenza A by PCR NEGATIVE NEGATIVE Final   Influenza B by PCR NEGATIVE NEGATIVE Final    Comment: (NOTE) The Xpert Xpress SARS-CoV-2/FLU/RSV plus assay is intended as an aid in the diagnosis of influenza from Nasopharyngeal swab specimens and should not be used as a sole basis for treatment. Nasal washings and aspirates are unacceptable for Xpert Xpress SARS-CoV-2/FLU/RSV testing.  Fact  Sheet for Patients: BloggerCourse.com  Fact Sheet for Healthcare Providers: SeriousBroker.it  This test is not yet approved or cleared by the [place] FDA and has been authorized for detection and/or diagnosis of SARS-CoV-2 by FDA under an Emergency Use Authorization (EUA). This EUA will remain in effect (meaning this test can be used) for the duration of the COVID-19 declaration under Section 564(b)(1) of the Act, 21 U.S.C. section 360bbb-3(b)(1), unless the authorization is terminated or revoked.  Performed at [facility], [address] [place], [place] [address]   Blood Culture (routine x 2)     Status: Abnormal    Collection Time: 08/21/20  4:23 PM   Specimen: BLOOD  Result Value Ref Range Status   Specimen Description BLOOD SITE NOT SPECIFIED  Final   Special Requests   Final    BOTTLES DRAWN AEROBIC AND ANAEROBIC Blood Culture adequate volume   Culture  Setup Time   Final    AEROBIC BOTTLE ONLY GRAM POSITIVE COCCI Organism ID to follow CRITICAL RESULT CALLED TO, READ BACK BY AND VERIFIED WITH: T DANG PHARMD 08/22/20 2236 JDW    Culture (A)  Final    STREPTOCOCCUS SANGUINIS THE SIGNIFICANCE OF ISOLATING THIS ORGANISM FROM A SINGLE SET OF BLOOD CULTURES WHEN MULTIPLE SETS ARE DRAWN IS UNCERTAIN. PLEASE NOTIFY THE MICROBIOLOGY DEPARTMENT WITHIN ONE WEEK IF SPECIATION AND SENSITIVITIES ARE REQUIRED. Performed at [facility], [address] [place], [place] [address]    Report Status 08/24/2020 FINAL  Final  Blood Culture ID Panel (Reflexed)     Status: Abnormal   Collection Time: 08/21/20  4:23 PM  Result Value Ref Range Status   Enterococcus faecalis NOT DETECTED NOT DETECTED Final   Enterococcus Faecium NOT DETECTED NOT DETECTED Final   Listeria monocytogenes NOT DETECTED NOT DETECTED Final   Staphylococcus species NOT DETECTED NOT DETECTED Final   Staphylococcus aureus (BCID) NOT DETECTED NOT DETECTED Final   Staphylococcus epidermidis NOT DETECTED NOT DETECTED Final   Staphylococcus lugdunensis NOT DETECTED NOT DETECTED Final   Streptococcus species DETECTED (A) NOT DETECTED Final    Comment: Not Enterococcus species, Streptococcus agalactiae, Streptococcus pyogenes, or Streptococcus pneumoniae. CRITICAL RESULT CALLED TO, READ BACK BY AND VERIFIED WITH: T DANG PHARMD 08/22/20 2236 JDW    Streptococcus agalactiae NOT DETECTED NOT DETECTED Final   Streptococcus pneumoniae NOT DETECTED NOT DETECTED Final   Streptococcus pyogenes NOT DETECTED NOT DETECTED Final   A.calcoaceticus-baumannii NOT DETECTED NOT DETECTED Final   Bacteroides fragilis NOT DETECTED NOT DETECTED Final    Enterobacterales NOT DETECTED NOT DETECTED Final   Enterobacter cloacae complex NOT DETECTED NOT DETECTED Final   Escherichia coli NOT DETECTED NOT DETECTED Final   Klebsiella aerogenes NOT DETECTED NOT DETECTED Final   Klebsiella oxytoca NOT DETECTED NOT DETECTED Final   Klebsiella pneumoniae NOT DETECTED NOT DETECTED Final   Proteus species NOT DETECTED NOT DETECTED Final   Salmonella species NOT DETECTED NOT DETECTED Final   Serratia marcescens NOT DETECTED NOT DETECTED Final   Haemophilus influenzae NOT DETECTED NOT DETECTED Final   Neisseria meningitidis NOT DETECTED NOT DETECTED Final   Pseudomonas aeruginosa NOT DETECTED NOT DETECTED Final   Stenotrophomonas maltophilia NOT DETECTED NOT DETECTED Final   Candida albicans NOT DETECTED NOT DETECTED Final   Candida auris NOT DETECTED NOT DETECTED Final   Candida glabrata NOT DETECTED NOT DETECTED Final   Candida krusei NOT DETECTED NOT DETECTED Final   Candida parapsilosis NOT DETECTED NOT DETECTED Final   Candida  tropicalis NOT DETECTED NOT DETECTED Final   Cryptococcus neoformans/gattii NOT DETECTED NOT DETECTED Final    Comment: Performed at [facility], [address], [place], [place] [address]  Blood Culture (routine x 2)     Status: None   Collection Time: 08/22/20 12:38 AM   Specimen: BLOOD RIGHT HAND  Result Value Ref Range Status   Specimen Description BLOOD RIGHT HAND  Final   Special Requests   Final    BOTTLES DRAWN AEROBIC AND ANAEROBIC Blood Culture adequate volume   Culture   Final    NO GROWTH 5 DAYS Performed at [facility], [address], [place], [place] [address]    Report Status 08/27/2020 FINAL  Final  Respiratory Panel by PCR     Status: None   Collection Time: 08/22/20  1:02 PM   Specimen: Nasopharyngeal Swab; Respiratory  Result Value Ref Range Status   Adenovirus NOT DETECTED NOT DETECTED Final   Coronavirus 229E NOT DETECTED NOT DETECTED Final    Comment: (NOTE) The Coronavirus on  the Respiratory Panel, DOES NOT test for the novel  Coronavirus (2019 nCoV)    Coronavirus HKU1 NOT DETECTED NOT DETECTED Final   Coronavirus NL63 NOT DETECTED NOT DETECTED Final   Coronavirus OC43 NOT DETECTED NOT DETECTED Final   Metapneumovirus NOT DETECTED NOT DETECTED Final   Rhinovirus / Enterovirus NOT DETECTED NOT DETECTED Final   Influenza A NOT DETECTED NOT DETECTED Final   Influenza B NOT DETECTED NOT DETECTED Final   Parainfluenza Virus 1 NOT DETECTED NOT DETECTED Final   Parainfluenza Virus 2 NOT DETECTED NOT DETECTED Final   Parainfluenza Virus 3 NOT DETECTED NOT DETECTED Final   Parainfluenza Virus 4 NOT DETECTED NOT DETECTED Final   Respiratory Syncytial Virus NOT DETECTED NOT DETECTED Final   Bordetella pertussis NOT DETECTED NOT DETECTED Final   Bordetella Parapertussis NOT DETECTED NOT DETECTED Final   Chlamydophila pneumoniae NOT DETECTED NOT DETECTED Final   Mycoplasma pneumoniae NOT DETECTED NOT DETECTED Final    Comment: Performed at [facility], [address] [place], [place] [address]     Time coordinating discharge in minutes: 65  SIGNED:   Calvert Cantor, MD  [facility] 08/28/2020, 9:50 AM

## 2020-08-28 NOTE — TOC Transition Note (Addendum)
Transition of Care [facility]) - CM/SW Discharge Note   Patient Details  Name: Cheryl Haley MRN: [id number] Date of Birth: Apr 15, 1956  [facility] CM/SW Contact:  Beckie Busing, RN Phone Number: [phone]  08/28/2020, 11:58 AM   Clinical Narrative:    CM consulted for patient discharging home with [facility] & DME needs. Patient offered choice. HH services have been set up through [facility]. Patient states that she is active with [facility] for DME. CM has attempted to call [facility] to set up home nebulizer. Message left for [employer]. 2nd attempt to call [facility] the office is closed for lunch. CM will continue to follow to ensure nebulizer is delivered to the patient.   1330 Attempted to call [facility] to set up nebulizer delivery. Message has been left for Tamala Julian who is the person responsible for handling setup of DME.   1440 received return call from [place] at [employer]. Orders and progress notes have been faxed to [phone] with Attention  To Tamala Julian. Company will notify patient to set up delivery of DME. No further needs noted at this time. CM will sign off.  Final next level of care: Home w Home Health Services Barriers to Discharge: No Barriers Identified   Patient Goals and CMS Choice Patient states their goals for this hospitalization and ongoing recovery are:: Patient states that she is ready to go home CMS Medicare.gov Compare Post Acute Care list provided to:: Patient Choice offered to / list presented to : Patient  Discharge Placement                       Discharge Plan and Services                DME Arranged: Nebulizer/meds DME Agency: Other - Comment Avanell Shackleton) Date DME Agency Contacted: 08/28/20 Time DME Agency Contacted: 1154 Representative spoke with at DME Agency: Message has been left for Tamala Julian HH Arranged: RN,PT,OT,Nurse's Aide HH Agency: [facility] Date HH Agency Contacted:  08/28/20 Time HH Agency Contacted: 1156 Representative spoke with at [facility] Agency: [facility]  Social Determinants of Health (SDOH) Interventions     Readmission Risk Interventions No flowsheet data found.

## 2020-08-28 NOTE — Progress Notes (Signed)
Physical Therapy Treatment Patient Details Name: Cheryl Haley MRN: [id number] DOB: 1956/05/01 Today's Date: 08/28/2020    History of Present Illness 64 y.o. year-old w/ hx of tobacco abuse, PNA, HTN, HL, hx CVA/ TIA, COPD/ emphysema brought to ED by EMS for eval of gen'd weakness, SOB and cough.    PT Comments    Pt making daily progress towards her physical therapy goals, demonstrating improved activity tolerance this session. Ambulating 160 feet with a Rollator at a supervision level; did not require a rest break today. SpO2 96% on 2L O2, HR 85-114 bpm. Pt able to complete x 5 sit to stands towards end of session for functional strengthening. Will continue to progress as tolerated.     Follow Up Recommendations  Home health PT;Supervision - Intermittent     Equipment Recommendations  None recommended by PT    Recommendations for Other Services       Precautions / Restrictions Precautions Precautions: Fall Restrictions Weight Bearing Restrictions: No    Mobility  Bed Mobility               General bed mobility comments: OOB in chair  Transfers Overall transfer level: Modified independent Equipment used: 4-wheeled walker                Ambulation/Gait Ambulation/Gait assistance: Supervision Gait Distance (Feet): 160 Feet Assistive device: 4-wheeled walker Gait Pattern/deviations: Step-through pattern;Decreased stride length Gait velocity: decreased   General Gait Details: Supervision for safety, no gross instability noted   Stairs             Wheelchair Mobility    Modified Rankin (Stroke Patients Only)       Balance Overall balance assessment: Needs assistance Sitting-balance support: No upper extremity supported;Feet supported Sitting balance-Leahy Scale: Good     Standing balance support: No upper extremity supported;During functional activity Standing balance-Leahy Scale: Fair                               Cognition Arousal/Alertness: Awake/alert Behavior During Therapy: WFL for tasks assessed/performed Overall Cognitive Status: Within Functional Limits for tasks assessed                                        Exercises Other Exercises Other Exercises: x5 Sit to Stands    General Comments        Pertinent Vitals/Pain Pain Assessment: Faces Faces Pain Scale: No hurt    Home Living                      Prior Function            PT Goals (current goals can now be found in the care plan section) Acute Rehab PT Goals Patient Stated Goal: breathe better Potential to Achieve Goals: Good Progress towards PT goals: Progressing toward goals    Frequency    Min 3X/week      PT Plan Current plan remains appropriate    Co-evaluation              AM-PAC PT "6 Clicks" Mobility   Outcome Measure  Help needed turning from your back to your side while in a flat bed without using bedrails?: None Help needed moving from lying on your back to sitting on the side of a flat bed without using bedrails?: None  Help needed moving to and from a bed to a chair (including a wheelchair)?: None Help needed standing up from a chair using your arms (e.g., wheelchair or bedside chair)?: None Help needed to walk in hospital room?: A Little Help needed climbing 3-5 steps with a railing? : A Little 6 Click Score: 22    End of Session Equipment Utilized During Treatment: Oxygen Activity Tolerance: Patient tolerated treatment well Patient left: in chair;with call bell/phone within reach;with family/visitor present Nurse Communication: Mobility status PT Visit Diagnosis: Other abnormalities of gait and mobility (R26.89);Muscle weakness (generalized) (M62.81);Other (comment)     Time: [id number] PT Time Calculation (min) (ACUTE ONLY): 12 min  Charges:  $Therapeutic Activity: 8-22 mins                     Cheryl Haley, PT, DPT Acute Rehabilitation  Services Pager [phone] Office [phone]    Cheryl Haley 08/28/2020, 9:21 AM

## 2020-08-28 NOTE — Progress Notes (Signed)
Went over discharge instructions with the patient at the bedside. Patient verbalizes understanding that patient's medication is located at the [facility] pharmacy and the prednisone is a printed prescription. Patient also understands that Home Health and DME will call for follow up and will call for DME delivery. PIV and tele monitor has been removed and CCMD has been notified.

## 2020-08-30 ENCOUNTER — Telehealth: Payer: Self-pay | Admitting: Pulmonary Disease

## 2020-08-30 ENCOUNTER — Other Ambulatory Visit: Payer: Medicare Other

## 2020-08-30 DIAGNOSIS — J432 Centrilobular emphysema: Secondary | ICD-10-CM

## 2020-08-30 MED ORDER — REVEFENACIN 175 MCG/3ML IN SOLN: 175.0000 ug | Freq: Every day | RESPIRATORY_TRACT | 0 refills | Status: DC

## 2020-08-30 MED ORDER — ARFORMOTEROL TARTRATE 15 MCG/2ML IN NEBU: 15.0000 ug | INHALATION_SOLUTION | Freq: Two times a day (BID) | RESPIRATORY_TRACT | 0 refills | Status: DC

## 2020-08-30 MED ORDER — BUDESONIDE 0.5 MG/2ML IN SUSP: 0.5000 mg | Freq: Two times a day (BID) | RESPIRATORY_TRACT | 0 refills | Status: DC

## 2020-08-30 NOTE — Telephone Encounter (Signed)
08/30/20  Received after hours call.  Patient was recently discharged.  Patient was prescribed Brovana, Pulmicort, Yupelri.  Patient attempted to pick this up from the pharmacy and the pharmacy reported that these were prescribed without a diagnosis code linked to them.  Patient is also unsure why she is taking triple therapy nebulized meds.  We will discuss this today.  Plan: Sent in additional prescriptions of Brovana, Pulmicort, Yupelri with a diagnosis code of emphysema/COPD Reviewed with patient that the triple therapy nebs are maintenance meds to replace the Dulera/Spiriva HandiHaler Emphasized to patient that she should remain on Dulera/Spiriva HandiHaler until she is able to obtain all of the triple therapy nebulized meds Encourage patient to keep hospital follow-up with our office on 09/11/2019.  Switched hospital follow-up to myself, canceled EW NP hospital follow-up Confirmed February/2022 office visit to establish care with Dr. Marchelle Gearing

## 2020-09-04 ENCOUNTER — Other Ambulatory Visit: Payer: Self-pay | Admitting: Hematology & Oncology

## 2020-09-10 ENCOUNTER — Inpatient Hospital Stay: Payer: Medicare Other | Admitting: Primary Care

## 2020-09-10 ENCOUNTER — Ambulatory Visit (INDEPENDENT_AMBULATORY_CARE_PROVIDER_SITE_OTHER): Payer: Medicare Other

## 2020-09-10 ENCOUNTER — Ambulatory Visit (INDEPENDENT_AMBULATORY_CARE_PROVIDER_SITE_OTHER): Payer: Medicare Other | Admitting: Pulmonary Disease

## 2020-09-10 ENCOUNTER — Encounter: Payer: Self-pay | Admitting: Pulmonary Disease

## 2020-09-10 ENCOUNTER — Other Ambulatory Visit: Payer: Self-pay

## 2020-09-10 VITALS — BP 106/60 | HR 84 | Temp 98.0°F | Ht 64.0 in | Wt 101.2 lb

## 2020-09-10 DIAGNOSIS — B370 Candidal stomatitis: Secondary | ICD-10-CM | POA: Insufficient documentation

## 2020-09-10 DIAGNOSIS — J9611 Chronic respiratory failure with hypoxia: Secondary | ICD-10-CM

## 2020-09-10 DIAGNOSIS — J432 Centrilobular emphysema: Secondary | ICD-10-CM | POA: Diagnosis not present

## 2020-09-10 DIAGNOSIS — E43 Unspecified severe protein-calorie malnutrition: Secondary | ICD-10-CM

## 2020-09-10 DIAGNOSIS — R918 Other nonspecific abnormal finding of lung field: Secondary | ICD-10-CM | POA: Diagnosis not present

## 2020-09-10 DIAGNOSIS — Z79899 Other long term (current) drug therapy: Secondary | ICD-10-CM | POA: Insufficient documentation

## 2020-09-10 DIAGNOSIS — Z0000 Encounter for general adult medical examination without abnormal findings: Secondary | ICD-10-CM | POA: Insufficient documentation

## 2020-09-10 MED ORDER — REVEFENACIN 175 MCG/3ML IN SOLN: 175.0000 ug | Freq: Every day | RESPIRATORY_TRACT | 3 refills | Status: DC

## 2020-09-10 MED ORDER — ARFORMOTEROL TARTRATE 15 MCG/2ML IN NEBU: 15.0000 ug | INHALATION_SOLUTION | Freq: Two times a day (BID) | RESPIRATORY_TRACT | 3 refills | Status: DC

## 2020-09-10 MED ORDER — BUDESONIDE 0.5 MG/2ML IN SUSP: 0.5000 mg | Freq: Two times a day (BID) | RESPIRATORY_TRACT | 3 refills | Status: DC

## 2020-09-10 NOTE — Assessment & Plan Note (Signed)
We will send maintenance nebulized meds order to [facility]

## 2020-09-10 NOTE — Assessment & Plan Note (Signed)
Plan: Obtain COVID-19 vaccinations

## 2020-09-10 NOTE — Assessment & Plan Note (Addendum)
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 Chase Caller  Referral to pallative care today

## 2020-09-10 NOTE — Progress Notes (Addendum)
@Patient  ID: Cheryl Haley, female    DOB: Sep 08, 1955, 65 y.o.   MRN: [id number]  Chief Complaint  Patient presents with   Follow-up    Feeling better, sob, cough-green x 2 days,wheezing esp. In am    Referring provider: [name], MD  HPI:  65 year old female former smoker followed in our office for COPD and chronic respiratory failure  PMH: Protein calorie malnutrition, hypertension, hypothyroidism Smoker/ Smoking History: Former smoker.  Quit July/2018.  80-pack-year smoking history Maintenance: Dulera 200, Spiriva HandiHaler Pt of: Former Dr. Carlis Abbott patient, will establish with Dr. Chase Caller  09/11/2020  - Visit   Female former smoker found in office for pulmonary emphysema.  Former patient of Dr. Carlis Abbott.  Will establish with Dr. Tressie Stalker.  Presenting today as a hospital follow-up.  Patient was discharged on 08/28/2020.  An excerpt of that discharge summary as listed below:   Recommendations for Outpatient Follow-up:  1. F/u to determine if she needs chronic Prednisone 2. Please f/u on K, Mg and Phos as outpatient  Home Health:  ordered  Discharge Condition:  stable   CODE STATUS:  Full code   Diet recommendation:  Heart healthy Consultations:  Pulmonary  Procedures/Studies:  none   Brief Summary: Cheryl Haley a64 y.o.year-old w/ hx of tobacco abuse, PNA, HTN, HL, hx CVA/ TIA, COPD/ emphysema brought to ED by EMS for eval of gen'd weakness, SOB and cough. Seen at outside hospital recently and told may have COPD exacerbation, she rec'd Rx for prednisone for 10 days. She took the medication and was feeling a little better , but then again felt worsening dyspnea.  She presented to the ED and was found to have a temp of 101.6 and dyspnea. Given Vanc and Cefepime and admitted to the hospital.   Hospital Course:  Principal Problem: Acute on chronic respiratory failure with fever / COPD (chronic obstructive pulmonary disease) with emphysema - suspect  exacerbation due to viral infection - CT of the chest ordered to look for pneumonia and is unrevealing for an infection- No PE found either - appreciate pulmonary eval- She was started on Solumedrol on 12/23 - She tried to use the CPAP but could only tolerate it for 2 hrs. - added Mucinex and flutter valve for increasing sputum - cont steroids and wean over the next 3-4 weeks per pulmonary recommendation - I have ordered a Nebulizer machine for her as she does not have one at home - she is feeling significantly better now and is stable for dc home- cont 2 L o2  Active Problems:  Sepsis- likely viral lung infection > blood culture 1/4 reveals Strep sanguinous started on Ceftriaxone on 12/22 - Temp 100.5 on 12/22 and has not recurred since - ID feels it may be a contaminant and Ceftriaxone was stopped on 12/23- we have been following for fevers- none so far  Sinus tachycardia  -home Cardizem and Toprol were on hold for low BPs- HR improved once resumed and once dyspnea improved  HTN - cont home meds  Hypovolemia - Improved with IVF which have been stopped  Cachexia (? Related to COPD) - addeddietary supplements- nutrition consulted - following appetite and oral intake  Hypokalemia- hypophosphatemia -have been replaced aggressively  Hypothyroidism -cont Synthroid  Tobacco abuse  Essential thrombocythemia - cont Hydroxyurea   Patient was discharged home on triple therapy maintenance nebulized medications.  Patient has had difficulty with receiving these.  Patient called the on-call office team on 08/30/2020.  Reporting that she is unable  to receive these maintenance nebulized meds due to not having a diagnosis code at discharge.  Patient was encouraged to keep hospital follow-up today.  We will discuss this.  Patient still has not been able to obtain maintenance nebulized LABA and maintenance nebulized LAMA.  Patient remains unvaccinated  COVID-19  Patient does not feel that Spiriva HandiHaler is a appropriate inhaler for her.  She struggles with the inspiratory pull.  We will discuss this today.  Patient currently having worsening cough and congestion.  Coughing up green mucus.  She also has been dealing with thrush.  She is status post 5 days of doxycycline as well as 14 doses of Diflucan managed by primary care for oral thrush.  Questionaires / Pulmonary Flowsheets:   ACT:  No flowsheet data found.  MMRC: No flowsheet data found.  Epworth:  No flowsheet data found.  Tests:   CT chest 06/28/2019- severe centrilobular emphysema throughout both lungs.  Left upper lobe spiculated nodule associated with linear scar.  Right middle lobe spiculated nodule.  Airway thickening.  Scarring in right greater than left lower lobes.  No significant mediastinal or hilar adenopathy.  Nodules stable or smaller than previous CT scans.  CT cardiac scoring 10/07/2019- redemonstrated right middle lobe nodule.  No new concerning nodules.  12/26/2013-spirometry- FVC 2.0 (63% predicted), ratio 41, FEV1 0.8 (33% of addicted)  FENO:  No results found for: NITRICOXIDE  PFT: No flowsheet data found.  WALK:  SIX MIN WALK 06/10/2016 12/26/2013  Supplimental Oxygen during Test? (L/min) No No  Tech Comments: pt walked a moderate pace, tolerated walk well.  Patient tolerated walk well.    Imaging: DG Chest 2 View  Result Date: 09/10/2020 CLINICAL DATA:  Cough EXAM: CHEST - 2 VIEW COMPARISON:  August 21, 2020 chest radiograph; chest CT August 23, 2019 FINDINGS: There is underlying emphysematous change, similar in appearance compared to prior recent studies. There is chronic blunting of the right costophrenic angle. There is no edema or airspace opacity. Interstitial thickening is felt to be due to underlying scarring and probable chronic bronchitis. Nodular opacity seen on recent CT are not appreciable by radiography. Heart size is normal.  Pulmonary vascularity is stable, reflecting underlying emphysematous change. No adenopathy. There is aortic atherosclerosis. Stable appearing bony structures. IMPRESSION: Underlying emphysematous change with areas of interstitial thickening and mild scarring, stable. No edema or airspace opacity. Stable cardiac silhouette. There is aortic atherosclerosis. Aortic Atherosclerosis (ICD10-I70.0) and Emphysema (ICD10-J43.9). Electronically Signed   By: Lowella Grip III M.D.   On: 09/10/2020 10:40   DG Chest 2 View  Result Date: 08/21/2020 CLINICAL DATA:  Cough EXAM: CHEST - 2 VIEW COMPARISON:  CT 06/28/2020.  Radiography 04/18/2019. FINDINGS: Heart size is normal. Chronic aortic atherosclerosis. Chronic pulmonary emphysema with hyperinflation and interstitial scarring. No sign of focal infiltrate, mass, effusion or collapse. IMPRESSION: Emphysema. No active process evident. Aortic atherosclerosis. Electronically Signed   By: Nelson Chimes M.D.   On: 08/21/2020 14:31   CT ANGIO CHEST PE W OR WO CONTRAST  Result Date: 08/22/2020 CLINICAL DATA:  65 year old female with concern for pulmonary embolism. EXAM: CT ANGIOGRAPHY CHEST WITH CONTRAST TECHNIQUE: Multidetector CT imaging of the chest was performed using the standard protocol during bolus administration of intravenous contrast. Multiplanar CT image reconstructions and MIPs were obtained to evaluate the vascular anatomy. CONTRAST:  28mL OMNIPAQUE IOHEXOL 350 MG/ML SOLN COMPARISON:  Chest CT dated 06/28/2020. FINDINGS: Cardiovascular: There is no cardiomegaly or pericardial effusion. There is coronary vascular calcification. Moderate  atherosclerotic calcification of the thoracic aorta. No aneurysmal dilatation or dissection. The origins of the great vessels of the aortic arch appear patent as visualized. Evaluation of the pulmonary arteries is somewhat limited due to respiratory motion artifact. No pulmonary artery embolus identified. Mediastinum/Nodes:  There is no hilar or mediastinal adenopathy. The esophagus is grossly unremarkable. No mediastinal fluid collection. Lungs/Pleura: Severe emphysema. There are bibasilar linear atelectasis/scarring. There is a 9 mm nodule in the left upper lobe and a 12 mm nodule along the minor fissure similar to prior CT. No new consolidation. There is no pleural effusion pneumothorax. The central airways are patent. Upper Abdomen: No acute abnormality. Musculoskeletal: Osteopenia with degenerative changes of the spine. No acute osseous pathology. Review of the MIP images confirms the above findings. IMPRESSION: 1. No acute intrathoracic pathology. No CT evidence of pulmonary embolism. 2. Bilateral pulmonary nodules as seen on the prior CT. Given background of severe emphysema consider annual low dose CT cancer screening follow-up as per recommendation of prior CT. 3. Aortic Atherosclerosis (ICD10-I70.0) and Emphysema (ICD10-J43.9). Electronically Signed   By: Anner Crete M.D.   On: 08/22/2020 15:30   ECHOCARDIOGRAM COMPLETE  Result Date: 08/24/2020    ECHOCARDIOGRAM REPORT   Patient Name:   ZURIAH EHRGOTT Date of Exam: 08/24/2020 Medical Rec #:  [id number]       Height:       64.0 in Accession #:    [id number]      Weight:       109.3 lb Date of Birth:  11-27-1955      BSA:          1.513 m Patient Age:    42 years        BP:           123/65 mmHg Patient Gender: F               HR:           117 bpm. Exam Location:  Inpatient Procedure: 2D Echo, Color Doppler and Cardiac Doppler Indications:    Acute Respiratory Distress R06.03  History:        Patient has no prior history of Echocardiogram examinations.                 COPD; Risk Factors:Hypertension, Dyslipidemia and Former Smoker.  Sonographer:    Raquel Sarna Senior RDCS Referring Phys: [place]  Sonographer Comments: Scanned upright due to dyspnea. IMPRESSIONS  1. Left ventricular ejection fraction, by estimation, is 55 to 60%. The left ventricle has normal  function. The left ventricle has no regional wall motion abnormalities. Left ventricular diastolic parameters are indeterminate.  2. Right ventricular systolic function is normal. The right ventricular size is normal.  3. The mitral valve is normal in structure. No evidence of mitral valve regurgitation. No evidence of mitral stenosis. Moderate mitral annular calcification.  4. The aortic valve was not well visualized. There is mild calcification of the aortic valve. There is mild thickening of the aortic valve. Aortic valve regurgitation is not visualized. No aortic stenosis is present.  5. The inferior vena cava is normal in size with greater than 50% respiratory variability, suggesting right atrial pressure of 3 mmHg. FINDINGS  Left Ventricle: Left ventricular ejection fraction, by estimation, is 55 to 60%. The left ventricle has normal function. The left ventricle has no regional wall motion abnormalities. The left ventricular internal cavity size was normal in size. There is  no left ventricular hypertrophy. Left ventricular  diastolic parameters are indeterminate. Right Ventricle: The right ventricular size is normal. No increase in right ventricular wall thickness. Right ventricular systolic function is normal. Left Atrium: Left atrial size was normal in size. Right Atrium: Right atrial size was normal in size. Pericardium: There is no evidence of pericardial effusion. Mitral Valve: The mitral valve is normal in structure. There is mild thickening of the mitral valve leaflet(s). There is mild calcification of the mitral valve leaflet(s). Moderate mitral annular calcification. No evidence of mitral valve regurgitation. No evidence of mitral valve stenosis. Tricuspid Valve: The tricuspid valve is normal in structure. Tricuspid valve regurgitation is not demonstrated. No evidence of tricuspid stenosis. Aortic Valve: The aortic valve was not well visualized. There is mild calcification of the aortic valve. There  is mild thickening of the aortic valve. There is mild aortic valve annular calcification. Aortic valve regurgitation is not visualized. No aortic stenosis is present. Aortic valve mean gradient measures 4.6 mmHg. Aortic valve peak gradient measures 8.9 mmHg. Aortic valve area, by VTI measures 3.45 cm. Pulmonic Valve: The pulmonic valve was not well visualized. Pulmonic valve regurgitation is not visualized. No evidence of pulmonic stenosis. Aorta: The aortic root is normal in size and structure. Pulmonary Artery: Indeterminant PASP, inadequate TR jet. Venous: The inferior vena cava is normal in size with greater than 50% respiratory variability, suggesting right atrial pressure of 3 mmHg. IAS/Shunts: No atrial level shunt detected by color flow Doppler.  LEFT VENTRICLE PLAX 2D LVIDd:         3.80 cm LVIDs:         2.70 cm LV PW:         1.00 cm LV IVS:        0.90 cm LVOT diam:     1.90 cm LV SV:         85 LV SV Index:   56 LVOT Area:     2.84 cm  RIGHT VENTRICLE RV S prime:     14.00 cm/s TAPSE (M-mode): 2.0 cm LEFT ATRIUM             Index       RIGHT ATRIUM           Index LA diam:        3.20 cm 2.11 cm/m  RA Area:     10.50 cm LA Vol (A2C):   43.7 ml 28.88 ml/m RA Volume:   22.60 ml  14.93 ml/m LA Vol (A4C):   18.7 ml 12.36 ml/m LA Biplane Vol: 30.8 ml 20.35 ml/m  AORTIC VALVE AV Area (Vmax):    2.60 cm AV Area (Vmean):   2.96 cm AV Area (VTI):     3.45 cm AV Vmax:           149.31 cm/s AV Vmean:          100.407 cm/s AV VTI:            0.247 m AV Peak Grad:      8.9 mmHg AV Mean Grad:      4.6 mmHg LVOT Vmax:         137.00 cm/s LVOT Vmean:        105.000 cm/s LVOT VTI:          0.301 m LVOT/AV VTI ratio: 1.22  AORTA Ao Root diam: 2.70 cm Ao Asc diam:  3.10 cm  SHUNTS Systemic VTI:  0.30 m Systemic Diam: 1.90 cm Carlyle Dolly MD Electronically signed by Carlyle Dolly MD  Signature Date/Time: 08/24/2020/10:51:51 AM    Final     Lab Results:  CBC    Component Value Date/Time   WBC 4.1  08/22/2020 0038   RBC 3.20 (L) 08/22/2020 0038   HGB 10.3 (L) 08/22/2020 0038   HGB 11.1 (L) 05/04/2020 1132   HCT 30.3 (L) 08/22/2020 0038   PLT 136 (L) 08/22/2020 0038   PLT 269 05/04/2020 1132   MCV 94.7 08/22/2020 0038   MCH 32.2 08/22/2020 0038   MCHC 34.0 08/22/2020 0038   RDW 13.9 08/22/2020 0038   LYMPHSABS 0.6 (L) 08/21/2020 1406   MONOABS 0.3 08/21/2020 1406   EOSABS 0.0 08/21/2020 1406   BASOSABS 0.0 08/21/2020 1406    BMET    Component Value Date/Time   NA 135 08/27/2020 0320   K 3.5 08/27/2020 0320   CL 101 08/27/2020 0320   CO2 25 08/27/2020 0320   GLUCOSE 136 (H) 08/27/2020 0320   BUN 8 08/27/2020 0320   CREATININE 0.57 08/28/2020 0440   CREATININE 0.65 05/04/2020 1132   CALCIUM 7.9 (L) 08/27/2020 0320   GFRNONAA >60 08/28/2020 0440   GFRNONAA >60 05/04/2020 1132   GFRAA >60 05/04/2020 1132    BNP No results found for: BNP  ProBNP    Component Value Date/Time   PROBNP 256.1 (H) 09/20/2013 1640    Specialty Problems      Pulmonary Problems   COPD (chronic obstructive pulmonary disease) with emphysema ([place])    [name] 12/26/13:  FEV1 0.81 (33%), Ratio 41. No change with spiriva or incruse.       COPD with acute exacerbation ([place])   Pleural effusion    CXR 05/2014:  New small right effusion.       Chronic respiratory failure with hypoxia (HCC)    2L qHS and with exertion      Pulmonary nodule, left   Pulmonary nodules    07/2017 CT chest >pulmonary nodules noted.  07/2017 PET scan -Hypermetabolic LUL nodule  [id number] CT chest >decreased nodule size, new pulmonary nodules noted.  FOB -atypical cells  CT chest 07/2018 >stable nodules to smaller on established nodules . 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     Pneumonia    Protein-calorie malnutrition, severe 09/17/2018   Pulmonary nodules 07/14/2018   07/2017 CT chest >pulmonary nodules noted.  07/2017 PET scan -Hypermetabolic LUL nodule  [id number] CT chest >decreased nodule size, new pulmonary nodules noted.  FOB -atypical cells  CT chest 07/2018 >stable nodules to smaller on established nodules .  2 new nodules RUL , RLL >CT chest 3 months    Thyroid disease    Tobacco abuse 11/28/2015   Smoked 2 packs of cigarettes daily from teenage years until age 65, quit in 2016    Unspecified hypothyroidism 09/20/2013   Viral respiratory infection 09/16/2018    Tobacco History: Social History   Tobacco Use  Smoking Status Former Smoker   Packs/day: 2.00   Years: 40.00   Pack years: 80.00   Types: Cigarettes   Quit date: 03/16/2017   Years since quitting: 3.4  Smokeless Tobacco Never Used  Tobacco Comment       Counseling given: Not Answered Comment:     Continue to not smoke  Outpatient Encounter Medications as of 09/10/2020  Medication Sig   albuterol (PROVENTIL) (2.5 MG/3ML) 0.083% nebulizer solution Take 3 mLs (2.5 mg total) by nebulization every 6 (six) hours as needed for wheezing or shortness of breath.   aspirin EC 81 MG tablet Take 81 mg  daily by mouth.    diltiazem (CARDIZEM) 60 MG tablet Take 1 tablet (60 mg total) by mouth 2 (two) times daily.   doxycycline (VIBRA-TABS) 100 MG tablet Take 100 mg by mouth 2 (two) times daily.   EUTHYROX 88 MCG tablet Take 88 mcg by mouth daily before breakfast.   feeding supplement (ENSURE ENLIVE / ENSURE PLUS) LIQD Take 237 mLs by mouth 2 (two) times daily between meals.   fluconazole (DIFLUCAN) 200 MG tablet Take 200 mg by mouth daily.   guaiFENesin (MUCINEX) 600 MG 12 hr tablet Take 1 tablet (600 mg total) by mouth 2 (two) times daily.   hydroxyurea (HYDREA) 500 MG capsule TAKE 1 CAPSULE BY MOUTH EVERY OTHER DAY. MAY TAKE WITH FOOD TO MINIMIZE GI SIDE EFFECTS.   metoprolol succinate (TOPROL-XL) 25 MG 24 hr tablet Take 25 mg by mouth daily.   nystatin (MYCOSTATIN) 100000 UNIT/ML suspension Take 5 mLs by mouth 4 (four) times daily.   OXYGEN Inhale 2 L into the lungs continuous.   predniSONE (DELTASONE) 50 MG tablet Taper as follows 50 mg x 3 more days 40 mg x 5 days 30 mg x 5 days 20 mg x 5 days 10 mg x 5 days 5 mg x 5 days   tiotropium (SPIRIVA HANDIHALER) 18 MCG inhalation capsule Place 1 capsule (18 mcg total) into inhaler and inhale daily.   [DISCONTINUED] budesonide (PULMICORT) 0.5 MG/2ML nebulizer solution Take 2 mLs (0.5 mg total) by nebulization 2 (two) times daily.   acetaminophen (TYLENOL) 325 MG tablet Take 2 tablets (650 mg total) by mouth every 6 (six) hours as needed for mild pain (or Fever >/= 101). (Patient not taking: Reported on 09/10/2020)   arformoterol (BROVANA) 15 MCG/2ML NEBU Take 2 mLs (15 mcg total) by nebulization 2 (two) times daily.   budesonide (PULMICORT) 0.5 MG/2ML nebulizer solution Take 2 mLs (0.5 mg total) by nebulization 2 (two) times daily.   losartan (COZAAR) 50 MG tablet Take 50 mg by mouth daily. (Patient not taking: Reported on 09/10/2020)   mometasone-formoterol (DULERA) 100-5 MCG/ACT AERO Inhale 2 puffs into the lungs 2 (two) times  daily. (Patient not taking: Reported on 09/10/2020)   Multiple Vitamin (MULTIVITAMIN) tablet Take 1 tablet by mouth daily. (Patient not taking: Reported on 09/10/2020)   polyethylene glycol (MIRALAX / GLYCOLAX) 17 g packet Take 17 g by mouth daily as needed for mild constipation. (Patient not taking: Reported on 09/10/2020)   revefenacin (YUPELRI) 175 MCG/3ML nebulizer solution Take 3 mLs (175  mcg total) by nebulization daily.   rosuvastatin (CRESTOR) 40 MG tablet Take 1 tablet (40 mg total) by mouth daily. (Patient not taking: Reported on 09/10/2020)   [DISCONTINUED] arformoterol (BROVANA) 15 MCG/2ML NEBU Take 2 mLs (15 mcg total) by nebulization 2 (two) times daily. (Patient not taking: Reported on 09/10/2020)   [DISCONTINUED] revefenacin (YUPELRI) 175 MCG/3ML nebulizer solution Take 3 mLs (175 mcg total) by nebulization daily. (Patient not taking: Reported on 09/10/2020)   No facility-administered encounter medications on file as of 09/10/2020.     Review of Systems  Review of Systems  Constitutional: Positive for appetite change (not eating well ) and fatigue. Negative for activity change and fever.  HENT: Positive for congestion (green ) and mouth sores. Negative for sinus pressure, sinus pain and sore throat.   Respiratory: Positive for cough and shortness of breath. Negative for wheezing.   Cardiovascular: Negative for chest pain and palpitations.  Gastrointestinal: Negative for diarrhea, nausea and vomiting.  Musculoskeletal: Negative for arthralgias.  Neurological: Negative for dizziness.  Psychiatric/Behavioral: Negative for sleep disturbance. The patient is not nervous/anxious.      Physical Exam  BP 106/60 (BP Location: Left Arm, Cuff Size: Normal)    Pulse 84    Temp 98 F (36.7 C) (Temporal)    Ht 5\' 4"  (1.626 m)    Wt 101 lb 3.2 oz (45.9 kg)    SpO2 98%    BMI 17.37 kg/m   Wt Readings from Last 5 Encounters:  09/10/20 101 lb 3.2 oz (45.9 kg)  08/28/20 111 lb (50.3 kg)   07/12/20 117 lb 9.6 oz (53.3 kg)  06/06/20 117 lb (53.1 kg)  12/06/19 114 lb 9.6 oz (52 kg)    BMI Readings from Last 5 Encounters:  09/10/20 17.37 kg/m  08/28/20 19.05 kg/m  07/12/20 20.19 kg/m  06/06/20 20.08 kg/m  12/06/19 19.67 kg/m     Physical Exam Vitals and nursing note reviewed.  Constitutional:      General: She is not in acute distress.    Appearance: Normal appearance.     Comments: Thin cachectic female  HENT:     Head: Normocephalic and atraumatic.     Right Ear: Tympanic membrane, ear canal and external ear normal. There is no impacted cerumen.     Left Ear: Tympanic membrane, ear canal and external ear normal. There is no impacted cerumen.     Nose: Nose normal. No congestion.     Mouth/Throat:     Mouth: Mucous membranes are moist.     Pharynx: Oropharynx is clear.     Comments: Erythema in back of throat and on tongue, no white patchy areas Eyes:     Pupils: Pupils are equal, round, and reactive to light.  Cardiovascular:     Rate and Rhythm: Normal rate and regular rhythm.     Pulses: Normal pulses.     Heart sounds: Normal heart sounds. No murmur heard.   Pulmonary:     Breath sounds: No decreased air movement. No decreased breath sounds, wheezing or rales.     Comments: Severely diminished breath sounds Musculoskeletal:     Cervical back: Normal range of motion.     Right lower leg: No edema.     Left lower leg: No edema.  Skin:    General: Skin is warm and dry.     Capillary Refill: Capillary refill takes less than 2 seconds.  Neurological:     General: No focal deficit present.  Mental Status: She is alert and oriented to person, place, and time. Mental status is at baseline.     Gait: Gait normal.  Psychiatric:        Mood and Affect: Mood normal.        Behavior: Behavior normal.        Thought Content: Thought content normal.        Judgment: Judgment normal.       Assessment & Plan:   Chronic respiratory failure with  hypoxia (HCC) Plan: Continue oxygen therapy at this time Maintain oxygen saturations above 88%   COPD (chronic obstructive pulmonary disease) with emphysema (HCC) Spirometry in 2015 showed COPD stage III or severe obstruction Likely that patient has clinically worsened COPD stage IV Unsure if PFTs will provide much clinical benefit or change in current work-up  Plan: Will send maintenance nebulized meds to [facility] in hopes of getting medications approved  Start Pulmicort nebs twice daily Start Brovana nebulized meds twice daily Start Yupelri nebulized meds daily  Until maintenance nebs are obtained continue Dulera 200 and Spiriva HandiHaler If unable to afford maintenance nebulized meds please contact our office and we will consider switching to a triple therapy inhaler  Continue oxygen therapy Chest x-ray today Follow-up in February/2022 to establish care with Dr. Chase Caller  Referral to pallative care today   Oral thrush Plan: Recommend patient have appropriate oral care Rinse mouth out after utilizing maintenance inhalers/nebulizers Do not believe patient needs additional doses of Diflucan Can use nystatin rinses as needed  Abnormal findings on diagnostic imaging of lung  Chest x-ray today  Healthcare maintenance Plan: Obtain COVID-19 vaccinations  Medication management We will send maintenance nebulized meds order to direct [facility]  Protein-calorie malnutrition, severe Continue to utilize Ensure high-protein boost in between meals    Return in about 6 weeks (around 10/23/2020), or if symptoms worsen or fail to improve, for Follow up with Dr. Purnell Shoemaker, [place].   Lauraine Rinne, NP 09/11/2020   This appointment required 55 minutes of patient care (this includes precharting, chart review, review of results, face-to-face care, etc.).

## 2020-09-10 NOTE — Assessment & Plan Note (Signed)
Chest xray today.

## 2020-09-10 NOTE — Assessment & Plan Note (Signed)
Continue to utilize Ensure high-protein boost in between meals

## 2020-09-10 NOTE — Patient Instructions (Addendum)
You were seen today by Lauraine Rinne, NP  for:   1. Centrilobular emphysema ([place])  - DG Chest 2 View; Future - budesonide (PULMICORT) 0.5 MG/2ML nebulizer solution; Take 2 mLs (0.5 mg total) by nebulization 2 (two) times daily.  Dispense: 125 mL; Refill: 3 - arformoterol (BROVANA) 15 MCG/2ML NEBU; Take 2 mLs (15 mcg total) by nebulization 2 (two) times daily.  Dispense: 120 mL; Refill: 3 - revefenacin (YUPELRI) 175 MCG/3ML nebulizer solution; Take 3 mLs (175 mcg total) by nebulization daily.  Dispense: 90 mL; Refill: 3  We will send your triple therapy nebulized meds prescription to [facility]  Please review the [facility] information provided below:  You should expect an initial same-day call to process your prescription Explanation of prior authorization if needed Full benefit and co-pay assistance reviewed Free delivery Pharmacist counseling with 24-hour call service  If you have not heard from them within the next 24 hours or you have any questions please feel free to contact them directly at the information listed below:  Toll-free:  [phone] (then press 0) [phone] (then press 0)  Their hours of operation are Monday through Friday 9 AM to 8 PM Saturday 9 AM to 3 PM    Until you are able to receive all of your nebulized medication please proceed forward with:  [facility]  >>> 2 puffs in the morning right when you wake up, rinse out your mouth after use, 12 hours later 2 puffs, rinse after use >>> Take this daily, no matter what >>> This is not a rescue inhaler   Spiriva Handihaler 48mcg capsule >>> take 2 puffs using the inhaler and 1 capsule daily  >>> rinse mouth out after use  >>> take daily no matter what  >>> this is not a rescue inhaler   Note your daily symptoms > remember "red flags" for COPD:   >>>Increase in cough >>>increase in sputum production >>>increase in shortness of breath or activity  intolerance.   If you notice these symptoms, please call the  office to be seen.   If you are unable to afford the nebulized medications over prescribing please contact our office immediately as we will likely switch her inhalers   2. Abnormal findings on diagnostic imaging of lung  - DG Chest 2 View; Future  3. Chronic respiratory failure with hypoxia (HCC)  Continue oxygen therapy as prescribed  >>>maintain oxygen saturations greater than 88 percent  >>>if unable to maintain oxygen saturations please contact the office  >>>do not smoke with oxygen  >>>can use nasal saline gel or nasal saline rinses to moisturize nose if oxygen causes dryness  4. Medication management  We will send your triple therapy nebulized meds prescription to [facility]  Please review the direct Rx information provided below:  You should expect an initial same-day call to process your prescription Explanation of prior authorization if needed Full benefit and co-pay assistance reviewed Free delivery Pharmacist counseling with 24-hour call service  If you have not heard from them within the next 24 hours or you have any questions please feel free to contact them directly at the information listed below:  Toll-free:  [phone] (then press 0) [phone] (then press 0)  Their hours of operation are Monday through Friday 9 AM to 8 PM Saturday 9 AM to 3 PM  5. Protein-calorie malnutrition, severe  Continue to try to utilize high-protein boost or Ensure in between meals, not as meal replacement  6. Oral thrush  I do  not believe that you need a refill of the Diflucan which was provided by primary care  Okay to continue to use nystatin  As discussed today would highly recommend that you aggressively work on brushing her teeth and doing oral rinses after utilizing nebulized medications/inhalers  7. Healthcare maintenance  Would recommend COVID-19 vaccination  As discussed today I would wait until chest x-ray is performed to ensure that you do not have  pneumonia   We recommend today:  Orders Placed This Encounter  Procedures  . DG Chest 2 View    Standing Status:   Future    Number of Occurrences:   1    Standing Expiration Date:   03/10/2021    Order Specific Question:   Reason for Exam (SYMPTOM  OR DIAGNOSIS REQUIRED)    Answer:   cough    Order Specific Question:   Preferred imaging location?    Answer:   Internal    Order Specific Question:   Radiology Contrast Protocol - do NOT remove file path    Answer:   [email or web address]   Orders Placed This Encounter  Procedures  . DG Chest 2 View   Meds ordered this encounter  Medications  . budesonide (PULMICORT) 0.5 MG/2ML nebulizer solution    Sig: Take 2 mLs (0.5 mg total) by nebulization 2 (two) times daily.    Dispense:  125 mL    Refill:  3  . arformoterol (BROVANA) 15 MCG/2ML NEBU    Sig: Take 2 mLs (15 mcg total) by nebulization 2 (two) times daily.    Dispense:  120 mL    Refill:  3  . revefenacin (YUPELRI) 175 MCG/3ML nebulizer solution    Sig: Take 3 mLs (175 mcg total) by nebulization daily.    Dispense:  90 mL    Refill:  3    Follow Up:    Return in about 6 weeks (around 10/23/2020), or if symptoms worsen or fail to improve, for Follow up with Dr. Purnell Shoemaker, [place].   Notification of test results are managed in the following manner: If there are  any recommendations or changes to the  plan of care discussed in office today,  we will contact you and let you know what they are. If you do not hear from [place], then your results are normal and you can view them through your  MyChart account , or a letter will be sent to you. Thank you again for trusting [place] with your care  - Thank you, [facility]    It is flu season:   >>> Best ways to protect herself from the flu: Receive the yearly flu vaccine, practice good hand hygiene washing with soap and also using hand sanitizer when available, eat a  nutritious meals, get adequate rest, hydrate appropriately       Please contact the office if your symptoms worsen or you have concerns that you are not improving.   Thank you for choosing [facility] for your healthcare, and for allowing [place] to partner with you on your healthcare journey. I am thankful to be able to provide care to you today.   Wyn Quaker FNP-C

## 2020-09-10 NOTE — Assessment & Plan Note (Signed)
Plan: Continue oxygen therapy at this time Maintain oxygen saturations above 88%

## 2020-09-10 NOTE — Assessment & Plan Note (Signed)
Plan: Recommend patient have appropriate oral care Rinse mouth out after utilizing maintenance inhalers/nebulizers Do not believe patient needs additional doses of Diflucan Can use nystatin rinses as needed

## 2020-09-11 NOTE — Addendum Note (Signed)
Addended by: Lauraine Rinne on: 09/11/2020 12:45 PM   Modules accepted: Orders

## 2020-09-13 ENCOUNTER — Telehealth: Payer: Self-pay

## 2020-09-13 NOTE — Telephone Encounter (Signed)
Pt aware of possible delay and or closure due to weather on 09/17/20   aom

## 2020-09-13 NOTE — Telephone Encounter (Signed)
Patient was scheduled for a Palliative care consult for 10/02/20. She sent a my chart message requesting to cancel appointment, due to many other things going on. Will reach back out to patient in a couple weeks to see about rescheduling.

## 2020-09-14 ENCOUNTER — Telehealth: Payer: Self-pay

## 2020-09-14 ENCOUNTER — Telehealth: Payer: Self-pay | Admitting: Pulmonary Disease

## 2020-09-14 MED ORDER — BREZTRI AEROSPHERE 160-9-4.8 MCG/ACT IN AERO: 2.0000 | INHALATION_SPRAY | Freq: Two times a day (BID) | RESPIRATORY_TRACT | 3 refills | Status: DC

## 2020-09-14 MED ORDER — ALBUTEROL SULFATE HFA 108 (90 BASE) MCG/ACT IN AERS: 2.0000 | INHALATION_SPRAY | Freq: Four times a day (QID) | RESPIRATORY_TRACT | 6 refills | Status: DC | PRN

## 2020-09-14 MED ORDER — BREZTRI AEROSPHERE 160-9-4.8 MCG/ACT IN AERO: 2.0000 | INHALATION_SPRAY | Freq: Two times a day (BID) | RESPIRATORY_TRACT | 0 refills | Status: DC

## 2020-09-14 NOTE — Telephone Encounter (Signed)
Spoke with pt, aware of rx changes.  rx of breztri sent to preferred pharmacy.  Pt also requested refill of albuterol inhaler.  This has been sent as well.    Samples of Breztri placed up front, as well as [place] patient [place] forms.  Pt aware of this.  Nothing further needed at this time- will close encounter.

## 2020-09-14 NOTE — Telephone Encounter (Signed)
Sorry to hear this information.  Would recommend:  Trial of Breztri >>> 2 puffs in the morning right when you wake up, rinse out your mouth after use, 12 hours later 2 puffs, rinse after use >>> Take this daily, no matter what >>> This is not a rescue inhaler   This would replace patient's Dulera as well as Spiriva HandiHaler  This would also replace triple therapy nebs  Okay to provide sample and send in prescription.  Can send in 3 refills  Wyn Quaker, FNP

## 2020-09-14 NOTE — Telephone Encounter (Signed)
Copied from 09/10/20 AVS with Aaron Edelman:  ---------------------- Plan: Will send maintenance nebulized meds to [facility] in hopes of getting medications approved   Start Pulmicort nebs twice daily Start Brovana nebulized meds twice daily Start Yupelri nebulized meds daily   Until maintenance nebs are obtained continue Dulera 200 and Spiriva HandiHaler If unable to afford maintenance nebulized meds please contact our office and we will consider switching to a triple therapy inhaler  ---------------------  Patient's sister in law Juliann Pulse is not on DPR.  Cannot discuss this information with her until we receive approval from patient.  [facility] does not work with Medicare patients, so they cannot help get these medications cheaper for patient.  Aaron Edelman please advise on what triple therapy inhaler you would like to try patient on.  Thanks!

## 2020-09-14 NOTE — Telephone Encounter (Signed)
09/17/20 office not to open until 11 due to weather, appt has been r/s   aom

## 2020-09-17 ENCOUNTER — Inpatient Hospital Stay: Payer: Medicare Other | Admitting: Hematology & Oncology

## 2020-09-17 ENCOUNTER — Inpatient Hospital Stay: Payer: Medicare Other

## 2020-09-24 ENCOUNTER — Telehealth: Payer: Self-pay

## 2020-09-24 NOTE — Telephone Encounter (Signed)
Patient canceled initial consult appointment that was scheduled for 10/02/20. Attempted to contact patient to reschedule appointment. No answer or voicemail.

## 2020-09-25 ENCOUNTER — Encounter: Payer: Self-pay | Admitting: Family

## 2020-09-25 ENCOUNTER — Inpatient Hospital Stay (HOSPITAL_BASED_OUTPATIENT_CLINIC_OR_DEPARTMENT_OTHER): Payer: Medicare Other | Admitting: Family

## 2020-09-25 ENCOUNTER — Inpatient Hospital Stay: Payer: Medicare Other | Attending: Hematology & Oncology

## 2020-09-25 ENCOUNTER — Other Ambulatory Visit: Payer: Self-pay

## 2020-09-25 ENCOUNTER — Telehealth: Payer: Self-pay

## 2020-09-25 VITALS — BP 124/46 | HR 89 | Temp 97.9°F | Resp 18 | Ht 64.0 in | Wt 105.0 lb

## 2020-09-25 DIAGNOSIS — D473 Essential (hemorrhagic) thrombocythemia: Secondary | ICD-10-CM

## 2020-09-25 DIAGNOSIS — J449 Chronic obstructive pulmonary disease, unspecified: Secondary | ICD-10-CM | POA: Diagnosis not present

## 2020-09-25 DIAGNOSIS — Z7982 Long term (current) use of aspirin: Secondary | ICD-10-CM | POA: Diagnosis not present

## 2020-09-25 DIAGNOSIS — D75839 Thrombocytosis, unspecified: Secondary | ICD-10-CM | POA: Insufficient documentation

## 2020-09-25 DIAGNOSIS — D649 Anemia, unspecified: Secondary | ICD-10-CM | POA: Diagnosis not present

## 2020-09-25 DIAGNOSIS — D509 Iron deficiency anemia, unspecified: Secondary | ICD-10-CM

## 2020-09-25 LAB — CBC WITH DIFFERENTIAL (CANCER CENTER ONLY)
Abs Immature Granulocytes: 0.15 10*3/uL — ABNORMAL HIGH (ref 0.00–0.07)
Basophils Absolute: 0 10*3/uL (ref 0.0–0.1)
Basophils Relative: 0 %
Eosinophils Absolute: 0 10*3/uL (ref 0.0–0.5)
Eosinophils Relative: 0 %
HCT: 32.9 % — ABNORMAL LOW (ref 36.0–46.0)
Hemoglobin: 10.6 g/dL — ABNORMAL LOW (ref 12.0–15.0)
Immature Granulocytes: 2 %
Lymphocytes Relative: 15 %
Lymphs Abs: 1.1 10*3/uL (ref 0.7–4.0)
MCH: 31.7 pg (ref 26.0–34.0)
MCHC: 32.2 g/dL (ref 30.0–36.0)
MCV: 98.5 fL (ref 80.0–100.0)
Monocytes Absolute: 0.3 10*3/uL (ref 0.1–1.0)
Monocytes Relative: 5 %
Neutro Abs: 5.9 10*3/uL (ref 1.7–7.7)
Neutrophils Relative %: 78 %
Platelet Count: 288 10*3/uL (ref 150–400)
RBC: 3.34 MIL/uL — ABNORMAL LOW (ref 3.87–5.11)
RDW: 17.1 % — ABNORMAL HIGH (ref 11.5–15.5)
WBC Count: 7.5 10*3/uL (ref 4.0–10.5)
nRBC: 0 % (ref 0.0–0.2)

## 2020-09-25 LAB — CMP (CANCER CENTER ONLY)
ALT: 45 U/L — ABNORMAL HIGH (ref 0–44)
AST: 31 U/L (ref 15–41)
Albumin: 3.5 g/dL (ref 3.5–5.0)
Alkaline Phosphatase: 54 U/L (ref 38–126)
Anion gap: 10 (ref 5–15)
BUN: 21 mg/dL (ref 8–23)
CO2: 30 mmol/L (ref 22–32)
Calcium: 8.9 mg/dL (ref 8.9–10.3)
Chloride: 96 mmol/L — ABNORMAL LOW (ref 98–111)
Creatinine: 0.79 mg/dL (ref 0.44–1.00)
GFR, Estimated: >60 mL/min (ref >60)
Glucose, Bld: 108 mg/dL — ABNORMAL HIGH (ref 70–99)
Potassium: 3.7 mmol/L (ref 3.5–5.1)
Sodium: 136 mmol/L (ref 135–145)
Total Bilirubin: 0.4 mg/dL (ref 0.3–1.2)
Total Protein: 6.4 g/dL — ABNORMAL LOW (ref 6.5–8.1)

## 2020-09-25 LAB — SAVE SMEAR(SSMR), FOR PROVIDER SLIDE REVIEW

## 2020-09-25 LAB — PLATELET BY CITRATE

## 2020-09-25 LAB — LACTATE DEHYDROGENASE: LDH: 235 U/L — ABNORMAL HIGH (ref 98–192)

## 2020-09-25 NOTE — Progress Notes (Signed)
Hematology and Oncology Follow Up Visit  Cheryl Haley [id number] [birth year] 65 y.o. 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 She is a little teary during our visit. Sadly, her sweet sister passed away this morning.  Cheryl Haley was hospitalized with acute respiratory failure in December. She is now home and doing PT 1-2 days a week. She also exercises by herself on the days they do not come.  She states that her SOB is stable on 2L (3L if up and moving around) of supplemental O2 24 hours a day.  Platelets today are stable at 288, Hgb 10.6, MCV 98 and WBC count 7.5.  She denies fever, chills, n/v, rash, dizziness, chest pain, palpitations, abdominal pain or changes in bowel or bladder habits.  She has had some mild puffiness in the ankles (mostly the left) while weaning off prednisone.  No numbness or tingling.  No falls or syncope to report.  No blood loss. No abnormal bruising or petechiae. She does bruise easily on her arms due to aspirin.  She notes that her appetite is improving and she is hydrating well. She states that her weight is up 2-3 lbs since she left the hospital.   ECOG Performance Status: 1 - Symptomatic but completely ambulatory  Medications:  Allergies as of 09/25/2020   No Known Allergies     Medication List       Accurate as of September 25, 2020 10:41 AM. If you have any questions, ask your nurse or doctor.        acetaminophen 325 MG tablet Commonly known as: TYLENOL Take 2 tablets (650 mg total) by mouth every 6 (six) hours as needed for mild pain (or Fever >/= 101).   albuterol (2.5 MG/3ML) 0.083% nebulizer solution Commonly known as: PROVENTIL Take 3 mLs (2.5 mg total) by nebulization every 6 (six) hours as needed for wheezing or shortness of  breath.   albuterol 108 (90 Base) MCG/ACT inhaler Commonly known as: VENTOLIN HFA Inhale 2 puffs into the lungs every 6 (six) hours as needed for wheezing or shortness of breath.   aspirin EC 81 MG tablet Take 81 mg daily by mouth.   Breztri Aerosphere 160-9-4.8 MCG/ACT Aero Generic drug: Budeson-Glycopyrrol-Formoterol Inhale 2 puffs into the lungs in the morning and at bedtime.   Breztri Aerosphere 160-9-4.8 MCG/ACT Aero Generic drug: Budeson-Glycopyrrol-Formoterol Inhale 2 puffs into the lungs in the morning and at bedtime.   diltiazem 60 MG tablet Commonly known as: CARDIZEM Take 1 tablet (60 mg total) by mouth 2 (two) times daily.   doxycycline 100 MG tablet Commonly known as: VIBRA-TABS Take 100 mg by mouth 2 (two) times daily.   Euthyrox 88 MCG tablet Generic drug: levothyroxine Take 88 mcg by mouth daily before breakfast.   feeding supplement Liqd Take 237 mLs by mouth 2 (two) times daily between meals.   fluconazole 200 MG tablet Commonly known as: DIFLUCAN Take 200 mg by mouth daily.   guaiFENesin 600 MG 12 hr tablet Commonly known as: MUCINEX Take 1 tablet (600 mg total) by mouth 2 (two) times daily.   hydroxyurea 500 MG capsule Commonly known as: HYDREA TAKE 1 CAPSULE BY MOUTH EVERY OTHER DAY. MAY TAKE WITH FOOD TO MINIMIZE GI SIDE EFFECTS.   losartan 50 MG tablet Commonly known as: COZAAR Take 50 mg  by mouth daily.   metoprolol succinate 25 MG 24 hr tablet Commonly known as: TOPROL-XL Take 25 mg by mouth daily.   multivitamin tablet Take 1 tablet by mouth daily.   nystatin 100000 UNIT/ML suspension Commonly known as: MYCOSTATIN Take 5 mLs by mouth 4 (four) times daily.   OXYGEN Inhale 2 L into the lungs continuous.   polyethylene glycol 17 g packet Commonly known as: MIRALAX / GLYCOLAX Take 17 g by mouth daily as needed for mild constipation.   predniSONE 50 MG tablet Commonly known as: DELTASONE Taper as follows 50 mg x 3 more days 40  mg x 5 days 30 mg x 5 days 20 mg x 5 days 10 mg x 5 days 5 mg x 5 days   rosuvastatin 40 MG tablet Commonly known as: CRESTOR Take 1 tablet (40 mg total) by mouth daily.       Allergies: No Known Allergies  Past Medical History, Surgical history, Social history, and Family History were reviewed and updated.  Review of Systems: All other 10 point review of systems is negative.   Physical Exam:  vitals were not taken for this visit.   Wt Readings from Last 3 Encounters:  09/10/20 101 lb 3.2 oz (45.9 kg)  08/28/20 111 lb (50.3 kg)  07/12/20 117 lb 9.6 oz (53.3 kg)    Ocular: Sclerae unicteric, pupils equal, round and reactive to light Ear-nose-throat: Oropharynx clear, dentition fair Lymphatic: No cervical or supraclavicular adenopathy Lungs no rales or rhonchi, good excursion bilaterally Heart regular rate and rhythm, no murmur appreciated Abd soft, nontender, positive bowel sounds MSK no focal spinal tenderness, no joint edema Neuro: non-focal, well-oriented, appropriate affect Breasts: Deferred   Lab Results  Component Value Date   WBC 7.5 09/25/2020   HGB 10.6 (L) 09/25/2020   HCT 32.9 (L) 09/25/2020   MCV 98.5 09/25/2020   PLT 288 09/25/2020   Lab Results  Component Value Date   FERRITIN 175 09/15/2019   IRON 135 09/15/2019   TIBC 266 09/15/2019   UIBC 131 09/15/2019   IRONPCTSAT 51 09/15/2019   Lab Results  Component Value Date   RBC 3.34 (L) 09/25/2020   No results found for: KPAFRELGTCHN, LAMBDASER, KAPLAMBRATIO No results found for: Kandis Cocking, IGMSERUM Lab Results  Component Value Date   TOTALPROTELP 6.8 09/23/2018   ALBUMINELP 3.9 09/23/2018   A1GS 0.1 09/23/2018   A2GS 0.9 09/23/2018   BETS 0.8 09/23/2018   GAMS 1.0 09/23/2018   MSPIKE Not Observed 09/23/2018   SPEI Comment 09/23/2018     Chemistry      Component Value Date/Time   NA 135 08/27/2020 0320   K 3.5 08/27/2020 0320   CL 101 08/27/2020 0320   CO2 25 08/27/2020 0320    BUN 8 08/27/2020 0320   CREATININE 0.57 08/28/2020 0440   CREATININE 0.65 05/04/2020 1132      Component Value Date/Time   CALCIUM 7.9 (L) 08/27/2020 0320   ALKPHOS 53 08/22/2020 0038   AST 26 08/22/2020 0038   AST 23 05/04/2020 1132   ALT 20 08/22/2020 0038   ALT 16 05/04/2020 1132   BILITOT 0.7 08/22/2020 0038   BILITOT 0.4 05/04/2020 1132       Impression and Plan: Cheryl Haley is a very pleasant 65 yo female with essential thrombocythemia,JAK2 V617F+;high risk by IPSET.  She is tolerating Hydrea every other day and aspirin daily nicely. Her counts are holding steady.  Iron studies are pending. We will replace if needed.  She would like to try going to every 6 months for follow-up.  She was encouraged to contact our office with any questions or concerns. We can certainly see her sooner if needed.   Laverna Peace, NP 1/25/202210:41 AM

## 2020-09-25 NOTE — Telephone Encounter (Signed)
appts made and printed for pt/also mychart view per 09/25/20 los

## 2020-09-26 LAB — FERRITIN: Ferritin: 400 ng/mL — ABNORMAL HIGH (ref 11–307)

## 2020-09-26 LAB — IRON AND TIBC
Iron: 95 ug/dL (ref 41–142)
Saturation Ratios: 37 % (ref 21–57)
TIBC: 260 ug/dL (ref 236–444)
UIBC: 165 ug/dL (ref 120–384)

## 2020-09-27 ENCOUNTER — Telehealth: Payer: Self-pay

## 2020-09-27 NOTE — Telephone Encounter (Signed)
Attempted to contact patient to schedule a Palliative Care consult appointment. No answer left a message to return call.  

## 2020-10-01 ENCOUNTER — Telehealth: Payer: Self-pay

## 2020-10-01 NOTE — Telephone Encounter (Signed)
Attempted to contact patient to schedule a Palliative Care consult appointment. No answer left a message to return call.  

## 2020-10-02 ENCOUNTER — Other Ambulatory Visit: Payer: Self-pay | Admitting: Nurse Practitioner

## 2020-10-03 ENCOUNTER — Other Ambulatory Visit: Payer: Self-pay | Admitting: Hematology & Oncology

## 2020-10-08 ENCOUNTER — Telehealth: Payer: Self-pay

## 2020-10-08 NOTE — Telephone Encounter (Signed)
Attempted to contact patient to schedule a Palliative Care consult appointment. No answer left a message to return call. After several attempts to schedule consult with no call back will cancel referral and notify PCP.

## 2020-10-15 NOTE — Telephone Encounter (Signed)
Noted.  Will route to Dr. Chase Caller and Raquel Sarna as they will see the patient next as FYI.  Wyn Quaker, FNP

## 2020-10-23 ENCOUNTER — Encounter: Payer: Self-pay | Admitting: Internal Medicine

## 2020-10-23 ENCOUNTER — Other Ambulatory Visit: Payer: Self-pay

## 2020-10-23 ENCOUNTER — Ambulatory Visit (INDEPENDENT_AMBULATORY_CARE_PROVIDER_SITE_OTHER): Payer: Medicare Other | Admitting: Internal Medicine

## 2020-10-23 VITALS — BP 112/60 | HR 89 | Temp 97.6°F | Ht 64.0 in | Wt 106.0 lb

## 2020-10-23 DIAGNOSIS — J449 Chronic obstructive pulmonary disease, unspecified: Secondary | ICD-10-CM | POA: Diagnosis not present

## 2020-10-23 DIAGNOSIS — J432 Centrilobular emphysema: Secondary | ICD-10-CM

## 2020-10-23 DIAGNOSIS — J9611 Chronic respiratory failure with hypoxia: Secondary | ICD-10-CM

## 2020-10-23 MED ORDER — ROFLUMILAST 250 MCG PO TABS: 1.0000 | ORAL_TABLET | Freq: Every day | ORAL | 0 refills | Status: DC

## 2020-10-23 NOTE — Progress Notes (Signed)
Synopsis: Referred in 2015 for COPD by [name], MD. Formerly a patient of [name].  Subjective:   PATIENT ID: Cheryl Haley GENDER: female DOB: [birth year], MRN: [id number]  Chief Complaint  Patient presents with  . Follow-up    Patient states that she feels better since last visit in Jan. Patient is on 3 liters with exertion and on 2 liters at home with concentrator. Patient states that last night she coughed up some phelgm and noticed it had some blood in it. She has also noticed blood when blowing her nose so isnt sure if its from her lungs or sinusesl    Mrs. Caridi is a 65 year old woman with a history of COPD and chronic respiratory failure on home oxygen who presents for follow-up.  She had an exacerbation requiring hospitalization in January 2020, but has not been hospitalized for COPD otherwise.  Since that exacerbation she has not required antibiotics or prednisone.  She continues on Spiriva once daily and Symbicort 2 puffs twice daily.  She reports that she runs out her Symbicort early.  She just felt an inhaler on 3/25 and reports that she already is less than 20 puffs.  She is spoken to her pharmacist about this.  She has no significant cough or sputum production, wheezing.  Her dyspnea on exertion is at baseline.  She walks slower than her peers and can only walk less than 1 block.  She does not use albuterol due to lack of perceived benefit.  She is able to complete her daily activities.  She is up-to-date on pneumonia and flu vaccines.  She uses 3 L pulsed oxygen with her Elberta Spaniel and is on 2 L continuous at home. Trelegy was previously prescribed in January, which her insurance did not cover.  Former tobacco abuse before quitting in 2018, 80-pack-year history.  Significant Hospital Events:   65 yo female, former smoker, with chronic hypoxic (2L) resp failure who presented with progressive dyspnea and weakness.  She had recent hospitalization at [facility] for COPD  exacerbation (her only one in several years).  She had persistent dyspnea and fever (temp 101).  She is followed in pulmonary office by [name] and to establish with Dr. Dewald.(changed to DR Chase Caller 08/26/2020)  She developed fever, decreased appetite, fatigue and weakness earlier this month.  She was admitted to [facility] and treated for COPD exacerbation.  Several days after discharge she developed recurrence of her symptoms.  She also thinks she has lost about 10 lbs.  She has felt her heart rate going fast.  She quit smoking 3-4 years ago.  No recent travel history or sick exposures.  No animal/bird exposures.  TN, PNA, CVA, HLD, COPD with emphysema, Chronic hypoxic respiratory failure, Hypothyroidism, Hiatal hernia, Essential thrombocythemia, OA, Anxiety, Influenza A January 2015  Spirometry 12/26/13 >> FEV1 0.8 (33%), FEV1% 41  CT angio chest 08/22/20 >> no acute process, negative for PE, bilateral pulmonary nodules, severe background emphysema    12/21 Admit 12/22 PCCM consulted  12/23  - start solumedrol 12/24 - Pt reports feeling better than on admission but not back to baseline .On 2L  1225 = ABG without hypercapnia.  Apprehensive about using BiPAP.  I think she use CPAP for few hours last night.  Subjective improvement not clear.  This morning she tells me that even though she is better since the time we started Solu-Medrol 2 days ago she does not think she is as good as yesterday.  She complains of  some dyspnea on exertion.  She is willing to try noninvasive positive pressure ventilation again tonight.  She tells me that hospitalist has agreed to give her some Tylenol PM to help her sleep better and then she would use the mask. She is afebrile since 08/22/2020   xxxxxxxxxxxxxxxxxxxxxxxxxxxxxx   OV 10/23/2020  Subjective:  Patient ID: Cheryl Haley, female , DOB: 1956/02/12 , age 65 y.o. , MRN: [id number] , ADDRESS: [address] [place] [address] PCP [name], MD Patient Care Team: [name], MD as PCP - General (Family Medicine) Sueanne Margarita, MD as PCP - Cardiology (Cardiology)  This Provider for this visit: Treatment Team:  Attending Provider: Brand Males, MD    10/23/2020 -   Chief Complaint  Patient presents with  . New Patient (Initial Visit)    COPD   Gold stage III COPD with chronic hypoxemic respiratory failure without hypercapnia -last PFT 2015 Alpha-1 MS  HPI Lavella Myren 65 y.o. -returns for post hospital follow-up.  I saw him Christmas 2021 for COPD exacerbation.  She has advanced COPD alpha-1 MS.  After that she is followed up with nurse practitioner.  She continues to do well.  She is at home.  She is doing physical therapy 1 time a week.  Her sister joined with he today; Joann.  Patient lives in [place].  The sister lives in [place].  She prefers [place] for healthcare destination.  She is getting home physical therapy 1 time a week.  She says this is not enough.  I have asked her to talk to the therapist and get a prescription for the second day in a week or have the therapist come 2 times a week.  She uses triple inhaler therapy and oxygen 2 L nasal cannula.  She says even the simplest of exertions make her short of breath but she is slowly better.  She is resigned to the fact she might never go back to her previous baseline but also encouraged by the fact she is slowly improving.  CAT score today 17 documented below.  She is interested in Roflumilast after we discussed this with her.  She is interested in pulmonary rehabilitation after we discussed this with her.  Last PFT was in 2015   CAT Score 10/23/2020  Total CAT Score 17        CT Chest data  No results found.    PFT  No flowsheet data found.     has a past medical history of Anxiety, Arthritis, COPD (chronic obstructive pulmonary disease) ([place]), Emphysema lung ([place]), Essential thrombocythemia ([place]) (09/15/2019), History of  hiatal hernia, TIA (transient ischemic attack) and stroke, Hyperlipidemia, Hypertension, Hypothyroidism, Impaired fasting glucose, Pleural effusion (05/15/2014), Pneumonia, Protein-calorie malnutrition, severe (09/17/2018), Pulmonary nodules (07/14/2018), Thyroid disease, Tobacco abuse (11/28/2015), Unspecified hypothyroidism (09/20/2013), and Viral respiratory infection (09/16/2018).   reports that she quit smoking about 3 years ago. Her smoking use included cigarettes. She has a 80.00 pack-year smoking history. She has never used smokeless tobacco.  Past Surgical History:  Procedure Laterality Date  . ESOPHAGOGASTRODUODENOSCOPY    . VIDEO BRONCHOSCOPY WITH ENDOBRONCHIAL NAVIGATION Left 07/29/2017   Procedure: VIDEO BRONCHOSCOPY WITH ENDOBRONCHIAL NAVIGATION;  Surgeon: Collene Gobble, MD;  Location: [place];  Service: Thoracic;  Laterality: Left;    No Known Allergies  Immunization History  Administered Date(s) Administered  . Fluad Quad(high Dose 65+) 06/06/2020  . Influenza Split 05/31/2015, 06/07/2018, 05/09/2019  . Influenza,inj,Quad PF,6+ Mos 09/22/2013, 06/10/2016, 06/25/2017, 04/30/2018, 06/01/2019  .  Influenza,inj,quad, With Preservative 09/07/2015  . Pneumococcal Conjugate-13 04/17/2015  . Pneumococcal Polysaccharide-23 12/08/2016  . Tdap 04/10/2017    Family History  Problem Relation Age of Onset  . Emphysema Father   . Heart disease Father   . Emphysema Brother   . Emphysema Sister   . Emphysema Sister   . Heart disease Mother   . Heart disease Sister   . Heart disease Brother   . Heart disease Brother   . Congestive Heart Failure Sister   . Congestive Heart Failure Sister   . Cancer Sister        lung  . Cancer Sister        breast     Current Outpatient Medications:  .  albuterol (VENTOLIN HFA) 108 (90 Base) MCG/ACT inhaler, Inhale 2 puffs into the lungs every 6 (six) hours as needed for wheezing or shortness of breath., Disp: 8 g, Rfl: 6 .  aspirin EC 81 MG  tablet, Take 81 mg daily by mouth. , Disp: , Rfl:  .  Budeson-Glycopyrrol-Formoterol (BREZTRI AEROSPHERE) 160-9-4.8 MCG/ACT AERO, Inhale 2 puffs into the lungs in the morning and at bedtime., Disp: 2 g, Rfl: 0 .  diltiazem (CARDIZEM) 60 MG tablet, Take 1 tablet (60 mg total) by mouth 2 (two) times daily., Disp: 60 tablet, Rfl: 0 .  EUTHYROX 88 MCG tablet, Take 88 mcg by mouth daily before breakfast., Disp: , Rfl:  .  feeding supplement (ENSURE ENLIVE / ENSURE PLUS) LIQD, Take 237 mLs by mouth 2 (two) times daily between meals., Disp: 237 mL, Rfl: 12 .  hydroxyurea (HYDREA) 500 MG capsule, TAKE 1 CAPSULE BY MOUTH EVERY OTHER DAY *MAY  TAKE  WITH  FOOD  TO  MINIMIZE  GI  SIDE  EFFECTS*, Disp: 15 capsule, Rfl: 0 .  metoprolol succinate (TOPROL-XL) 25 MG 24 hr tablet, Take 25 mg by mouth daily., Disp: , Rfl:  .  OXYGEN, Inhale 2 L into the lungs continuous., Disp: , Rfl:  .  rosuvastatin (CRESTOR) 40 MG tablet, Take 1 tablet (40 mg total) by mouth daily., Disp: 90 tablet, Rfl: 3      Objective:   Vitals:   10/23/20 1108  BP: 112/60  Pulse: 89  Temp: 97.6 F (36.4 C)  TempSrc: Oral  SpO2: 98%  Weight: 106 lb (48.1 kg)  Height: 5\' 4"  (1.626 m)    Estimated body mass index is 18.19 kg/m as calculated from the following:   Height as of this encounter: 5\' 4"  (1.626 m).   Weight as of this encounter: 106 lb (48.1 kg).  @WEIGHTCHANGE @  [employer]   10/23/20 1108  Weight: 106 lb (48.1 kg)     Physical Exam   General: No distress.  Frail elderly lady with oxygen on Neuro: Alert and Oriented x 3. GCS 15. Speech normal Psych: Pleasant Resp:  Barrel Chest -yes.  Wheeze -no, Crackles -no, No overt respiratory distress CVS: Normal heart sounds. Murmurs -no Ext: Stigmata of Connective Tissue Disease -no HEENT: Normal upper airway. PEERL +. No post nasal drip        Assessment:       ICD-10-CM   1. Chronic respiratory failure with hypoxia (HCC)  J96.11   2. Centrilobular  emphysema ([place])  J43.2   3. Stage 3 severe COPD by GOLD classification ([place])  J44.9        Plan:     Patient Instructions     ICD-10-CM   1. Chronic respiratory failure with hypoxia (HCC)  J96.11   2. Centrilobular emphysema ([place])  J43.2   3. Stage 3 severe COPD by GOLD classification ([place])  J44.9     Clearly much improved and are more stable and stronger after recent exacerbation Christmas 2021  Plan -Continue oxygen 2 L nasal cannula continuous -Continue BREZTRI 2 puff 2 times daily -Take new tablet called roflumilast 1 tablet   -This is to prevent COPD exacerbations  - starter pack 273mcg per day after food x 30 days or 1 month  - then escalate to 561mcg daily after food x continue at this dose  - any side effects especially GI call us  -Continue home physical therapy with a therapist 1 time a week but try to add a second set of exercises on you home with a prescription for them a second day in the week -Referral pulmonary rehabilitation -either our health system in [place] or [facility] in [place] is closer and whoever can see you sooner]  Follow-up -Do spirometry and DLCO pre and postbronchodilator in 3 months -Return to see Dr. Chase Caller in 3 months or sooner if needed; 15-minute slot.     SIGNATURE    Dr. Brand Males, M.D., F.C.C.P,  Pulmonary and Critical Care Medicine Staff Physician, [place] Director - Interstitial Lung Disease  Program  Pulmonary [place] at [place], [place], [address]  Pager: [phone], If no answer or between  15:00h - 7:00h: call [phone] Telephone: [phone]  11:47 AM 10/23/2020

## 2020-10-23 NOTE — Patient Instructions (Signed)
ICD-10-CM   1. Chronic respiratory failure with hypoxia (HCC)  J96.11   2. Centrilobular emphysema ([place])  J43.2   3. Stage 3 severe COPD by GOLD classification ([place])  J44.9     Clearly much improved and are more stable and stronger after recent exacerbation Christmas 2021  Plan -Continue oxygen 2 L nasal cannula continuous -Continue BREZTRI 2 puff 2 times daily -Take new tablet called roflumilast 1 tablet   -This is to prevent COPD exacerbations  - starter pack 249mcg per day after food x 30 days or 1 month  - then escalate to 511mcg daily after food x continue at this dose  - any side effects especially GI call us  -Continue home physical therapy with a therapist 1 time a week but try to add a second set of exercises on you home with a prescription for them a second day in the week -Referral pulmonary rehabilitation -either our health system in [place] or [place] in [place] is closer and whoever can see you sooner]  Follow-up -Do spirometry and DLCO pre and postbronchodilator in 3 months -Return to see Dr. Chase Caller in 3 months or sooner if needed; 15-minute slot.

## 2020-11-02 ENCOUNTER — Telehealth (HOSPITAL_COMMUNITY): Payer: Self-pay | Admitting: *Deleted

## 2020-11-02 NOTE — Telephone Encounter (Signed)
Received pulmonary rehab referral from Dr. Chase Caller.  Pt lives in [place].  Called pt for desired location for pulmonary rehab.  There are 2 programs in [place] and [place] RN, BSN Cardiac and Pulmonary Rehab Nurse Navigator

## 2020-11-06 ENCOUNTER — Telehealth (HOSPITAL_COMMUNITY): Payer: Self-pay

## 2020-11-06 NOTE — Telephone Encounter (Signed)
Pt called returning a phone call I advised pt of other pulmonary rehab programs in her area pt stated that she will be staying in the [place] area while she is in rehab, I advised pt where we are with scheduling pt understood.

## 2020-11-08 ENCOUNTER — Other Ambulatory Visit: Payer: Self-pay | Admitting: Hematology & Oncology

## 2020-11-19 ENCOUNTER — Telehealth: Payer: Self-pay | Admitting: Internal Medicine

## 2020-11-19 MED ORDER — ROFLUMILAST 500 MCG PO TABS: 500.0000 ug | ORAL_TABLET | Freq: Every day | ORAL | 5 refills | Status: DC

## 2020-11-19 NOTE — Telephone Encounter (Signed)
Plan -Continue oxygen 2 L nasal cannula continuous -Continue BREZTRI 2 puff 2 times daily -Take new tablet called roflumilast 1 tablet              -This is to prevent COPD exacerbations             - starter pack 255mcg per day after food x 30 days or 1 month             - then escalate to 530mcg daily after food x continue at this dose             - any side effects especially GI call us  Called and spoke with patient. She stated that she has completed the Daliresp 252mcg and is ready for the Daliresp 595mcg. She wishes to have this sent to [facility] on [address]. Advised her that I would go ahead and send this in for her. She verbalized understanding.   Nothing further needed at time of call.

## 2020-11-28 ENCOUNTER — Telehealth: Payer: Self-pay | Admitting: Internal Medicine

## 2020-11-28 NOTE — Telephone Encounter (Signed)
Received a PA request for Daliresp 565mcg from [facility]. When I entered the information into [email or web address], the website stated that the PA had been archived by the pharmacy. I called [facility] and spoke with a pharmacy tech. She stated that the patient had requested a refill too soon after the 237mcg. Her insurance will pay for the Daliresp 583mcg prescription after April 14th. Pharmacy had cancelled the PA request.   Will shred the PA request we have. Nothing further needed at time of call.

## 2020-11-29 ENCOUNTER — Other Ambulatory Visit: Payer: Self-pay | Admitting: *Deleted

## 2020-11-29 MED ORDER — HYDROXYUREA 500 MG PO CAPS: ORAL_CAPSULE | ORAL | 4 refills | Status: DC

## 2020-12-06 ENCOUNTER — Ambulatory Visit: Payer: Medicare Other | Admitting: Adult Health

## 2021-01-07 ENCOUNTER — Other Ambulatory Visit: Payer: Self-pay | Admitting: Pulmonary Disease

## 2021-03-05 ENCOUNTER — Encounter: Payer: Self-pay | Admitting: Internal Medicine

## 2021-03-05 ENCOUNTER — Other Ambulatory Visit: Payer: Self-pay

## 2021-03-05 ENCOUNTER — Ambulatory Visit (INDEPENDENT_AMBULATORY_CARE_PROVIDER_SITE_OTHER): Payer: Medicare Other | Admitting: Internal Medicine

## 2021-03-05 VITALS — BP 130/68 | HR 92 | Temp 97.8°F | Ht 64.0 in | Wt 96.4 lb

## 2021-03-05 DIAGNOSIS — R918 Other nonspecific abnormal finding of lung field: Secondary | ICD-10-CM | POA: Diagnosis not present

## 2021-03-05 DIAGNOSIS — J9611 Chronic respiratory failure with hypoxia: Secondary | ICD-10-CM | POA: Diagnosis not present

## 2021-03-05 DIAGNOSIS — J432 Centrilobular emphysema: Secondary | ICD-10-CM

## 2021-03-05 DIAGNOSIS — J449 Chronic obstructive pulmonary disease, unspecified: Secondary | ICD-10-CM | POA: Diagnosis not present

## 2021-03-05 MED ORDER — PREDNISONE 5 MG PO TABS: 5.0000 mg | ORAL_TABLET | Freq: Every day | ORAL | 5 refills | Status: DC

## 2021-03-05 NOTE — Addendum Note (Signed)
Addended by: Lorretta Harp on: 03/05/2021 10:22 AM   Modules accepted: Orders

## 2021-03-05 NOTE — Progress Notes (Signed)
Synopsis: Referred in 2015 for COPD by [name], MD. Formerly a patient of [name].  Subjective:   PATIENT ID: Cheryl Haley GENDER: female DOB: 25-Nov-1955, MRN: [id number]  Chief Complaint  Patient presents with   Follow-up    Patient states that she feels better since last visit in Jan. Patient is on 3 liters with exertion and on 2 liters at home with concentrator. Patient states that last night she coughed up some phelgm and noticed it had some blood in it. She has also noticed blood when blowing her nose so isnt sure if its from her lungs or sinusesl    Cheryl Haley is a 65 year old woman with a history of COPD and chronic respiratory failure on home oxygen who presents for follow-up.  She had an exacerbation requiring hospitalization in January 2020, but has not been hospitalized for COPD otherwise.  Since that exacerbation she has not required antibiotics or prednisone.  She continues on Spiriva once daily and Symbicort 2 puffs twice daily.  She reports that she runs out her Symbicort early.  She just felt an inhaler on 3/25 and reports that she already is less than 20 puffs.  She is spoken to her pharmacist about this.  She has no significant cough or sputum production, wheezing.  Her dyspnea on exertion is at baseline.  She walks slower than her peers and can only walk less than 1 block.  She does not use albuterol due to lack of perceived benefit.  She is able to complete her daily activities.  She is up-to-date on pneumonia and flu vaccines.  She uses 3 L pulsed oxygen with her Elberta Spaniel and is on 2 L continuous at home. Trelegy was previously prescribed in January, which her insurance did not cover.  Former tobacco abuse before quitting in 2018, 80-pack-year history.  Significant Hospital Events:   65 yo female, former smoker, with chronic hypoxic (2L) resp failure who presented with progressive dyspnea and weakness.  She had recent hospitalization at [facility] for COPD  exacerbation (her only one in several years).  She had persistent dyspnea and fever (temp 101).  She is followed in pulmonary office by [name] and to establish with Dr. Dewald.(changed to DR Chase Caller 08/26/2020)   She developed fever, decreased appetite, fatigue and weakness earlier this month.  She was admitted to [facility] and treated for COPD exacerbation.  Several days after discharge she developed recurrence of her symptoms.  She also thinks she has lost about 10 lbs.  She has felt her heart rate going fast.  She quit smoking 3-4 years ago.  No recent travel history or sick exposures.  No animal/bird exposures.   TN, PNA, CVA, HLD, COPD with emphysema, Chronic hypoxic respiratory failure, Hypothyroidism, Hiatal hernia, Essential thrombocythemia, OA, Anxiety, Influenza A January 2015  Spirometry 12/26/13 >> FEV1 0.8 (33%), FEV1% 41 CT angio chest 08/22/20 >> no acute process, negative for PE, bilateral pulmonary nodules, severe background emphysema     12/21 Admit 12/22 PCCM consulted  12/23  - start solumedrol 12/24 - Pt reports feeling better than on admission but not back to baseline .On 2L   1225 = ABG without hypercapnia.  Apprehensive about using BiPAP.  I think she use CPAP for few hours last night.  Subjective improvement not clear.  This morning she tells me that even though she is better since the time we started Solu-Medrol 2 days ago she does not think she is as good as yesterday.  She complains of some dyspnea on exertion.  She is willing to try noninvasive positive pressure ventilation again tonight.  She tells me that hospitalist has agreed to give her some Tylenol PM to help her sleep better and then she would use the mask. She is afebrile since 08/22/2020   xxxxxxxxxxxxxxxxxxxxxxxxxxxxxx   OV 10/23/2020  Subjective:  Patient ID: Cheryl Haley, female , DOB: 1956-05-09 , age 65 y.o. , MRN: [id number] , ADDRESS: [address] [place] [address] PCP [name], MD Patient Care Team: [name], MD as PCP - General (Family Medicine) Sueanne Margarita, MD as PCP - Cardiology (Cardiology)  This Provider for this visit: Treatment Team:  Attending Provider: Brand Males, MD    10/23/2020 -   Chief Complaint  Patient presents with   New Patient (Initial Visit)    COPD   Gold stage III COPD with chronic hypoxemic respiratory failure without hypercapnia -last PFT 2015 Alpha-1 MS  HPI Cheryl Haley 65 y.o. -returns for post hospital follow-up.  I saw him Christmas 2021 for COPD exacerbation.  She has advanced COPD alpha-1 MS.  After that she is followed up with nurse practitioner.  She continues to do well.  She is at home.  She is doing physical therapy 1 time a week.  Her sister joined with he today; Cheryl Haley.  Patient lives in [place].  The sister lives in [place].  She prefers [place] for healthcare destination.  She is getting home physical therapy 1 time a week.  She says this is not enough.  I have asked her to talk to the therapist and get a prescription for the second day in a week or have the therapist come 2 times a week.  She uses triple inhaler therapy and oxygen 2 L nasal cannula.  She says even the simplest of exertions make her short of breath but she is slowly better.  She is resigned to the fact she might never go back to her previous baseline but also encouraged by the fact she is slowly improving.  CAT score today 17 documented below.  She is interested in Roflumilast after we discussed this with her.  She is interested in pulmonary rehabilitation after we discussed this with her.  Last PFT was in 2015   CAT Score 10/23/2020  Total CAT Score 17        OV 03/05/2021  Subjective:  Patient ID: Cheryl Haley, female , DOB: Jun 17, 1956 , age 65 y.o. , MRN: [id number] , ADDRESS: [address] [id number] PCP [name], MD Patient Care Team: [name], MD as PCP - General (Family  Medicine) Sueanne Margarita, MD as PCP - Cardiology (Cardiology)  This Provider for this visit: Treatment Team:  Attending Provider: Brand Males, MD   Gold stage III COPD with chronic hypoxemic respiratory failure without hypercapnia  -last PFT 2015 Multiple lung nodules  - 65mm LUL and 53mm Lingula (sice 2018 and decreaed to current size in feb 2020)  Alpha-1 MS  03/05/2021 -   Chief Complaint  Patient presents with   Follow-up    Pt states she has been doing better since last visit. States she still becomes SOB but states that she does not become SOB as quick as before after getting walking cart.     HPI Cheryl Haley 65 y.o. -returns for follow-up.  She presents with? Siser .  She at this point is continue oxygen 2 L at rest and uses 3 L portable when she  does exercise.  She continues on [place].  She started Roflumilast last visit tolerating it well.  She says albuterol inhaler does not help her but nebulizer does.  She is wondering about using it.  I told her it is just a rescue medication.  She feels COPD is well controlled at this point.  However she did try some prednisone she found at home and she used 10 mg a day for 2 weeks and she found some modest improvement in her symptoms she wants to use prednisone daily.  She agreed to adjust to 5 mg/day.  We discussed the long-term side effects and indications for prednisone and well COPD.  She definitely wants to give it a try.  She has a new primary care nurse practitioner.  Dr. Rex Kras has retired.  She is at the fully register with the nurse practitioner.  She says that she has not had any osteoporosis evaluation.  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  She try to enroll in pulm rehabilitation but has not heard back from [facility].  She thought she was to be doing pulm rehabilitation by now.  Her last CT scan of the chest was in December 2021.  She knows she needs an annual CT chest in December 2022.   CAT Score 10/23/2020   Total CAT Score 17         has a past medical history of Anxiety, Arthritis, COPD (chronic obstructive pulmonary disease) ([place]), Emphysema lung ([place]), Essential thrombocythemia ([place]) (09/15/2019), History of hiatal hernia, TIA (transient ischemic attack) and stroke, Hyperlipidemia, Hypertension, Hypothyroidism, Impaired fasting glucose, Pleural effusion (05/15/2014), Pneumonia, Protein-calorie malnutrition, severe (09/17/2018), Pulmonary nodules (07/14/2018), Thyroid disease, Tobacco abuse (11/28/2015), Unspecified hypothyroidism (09/20/2013), and Viral respiratory infection (09/16/2018).   reports that she quit smoking about 3 years ago. Her smoking use included cigarettes. She has a 80.00 pack-year smoking history. She has never used smokeless tobacco.  Past Surgical History:  Procedure Laterality Date   ESOPHAGOGASTRODUODENOSCOPY     VIDEO BRONCHOSCOPY WITH ENDOBRONCHIAL NAVIGATION Left 07/29/2017   Procedure: VIDEO BRONCHOSCOPY WITH ENDOBRONCHIAL NAVIGATION;  Surgeon: Collene Gobble, MD;  Location: [facility];  Service: Thoracic;  Laterality: Left;    No Known Allergies  Immunization History  Administered Date(s) Administered   Fluad Quad(high Dose 65+) 06/06/2020   Influenza Split 05/31/2015, 06/07/2018, 05/09/2019   Influenza,inj,Quad PF,6+ Mos 09/22/2013, 06/10/2016, 06/25/2017, 04/30/2018, 06/01/2019   Influenza,inj,quad, With Preservative 09/07/2015   Pneumococcal Conjugate-13 04/17/2015   Pneumococcal Polysaccharide-23 12/08/2016   Tdap 04/10/2017    Family History  Problem Relation Age of Onset   Emphysema Father    Heart disease Father    Emphysema Brother    Emphysema Sister    Emphysema Sister    Heart disease Mother    Heart disease Sister    Heart disease Brother    Heart disease Brother    Congestive Heart Failure Sister    Congestive Heart Failure Sister    Cancer Sister        lung   Cancer Sister        breast     Current Outpatient Medications:     aspirin EC 81 MG tablet, Take 81 mg daily by mouth. , Disp: , Rfl:    BREZTRI AEROSPHERE 160-9-4.8 MCG/ACT AERO, INHALE 2 PUFFS IN THE MORNING AND AT BEDTIME, Disp: 11 g, Rfl: 3   feeding supplement (ENSURE ENLIVE / ENSURE PLUS) LIQD, Take 237 mLs by mouth 2 (two) times daily between meals., Disp: 237  mL, Rfl: 12   hydroxyurea (HYDREA) 500 MG capsule, TAKE 1 CAPSULE BY MOUTH EVERY OTHER DAY *MAY  TAKE  WITH  FOOD  TO  MINIMZE  GI  SIDE  EFFECTS*, Disp: 15 capsule, Rfl: 4   levothyroxine (SYNTHROID) 75 MCG tablet, Take 75 mcg by mouth daily before breakfast., Disp: , Rfl:    metoprolol succinate (TOPROL-XL) 25 MG 24 hr tablet, Take 25 mg by mouth daily., Disp: , Rfl:    OXYGEN, Inhale 2 L into the lungs continuous., Disp: , Rfl:    roflumilast (DALIRESP) 500 MCG TABS tablet, Take 1 tablet (500 mcg total) by mouth daily., Disp: 30 tablet, Rfl: 5   rosuvastatin (CRESTOR) 40 MG tablet, Take 1 tablet (40 mg total) by mouth daily., Disp: 90 tablet, Rfl: 3   albuterol (VENTOLIN HFA) 108 (90 Base) MCG/ACT inhaler, Inhale 2 puffs into the lungs every 6 (six) hours as needed for wheezing or shortness of breath. (Patient not taking: Reported on 03/05/2021), Disp: 8 g, Rfl: 6      Objective:   Vitals:   03/05/21 0943  BP: 130/68  Pulse: 92  Temp: 97.8 F (36.6 C)  TempSrc: Oral  SpO2: 99%  Weight: 96 lb 6.4 oz (43.7 kg)  Height: 5\' 4"  (1.626 m)    Estimated body mass index is 16.55 kg/m as calculated from the following:   Height as of this encounter: 5\' 4"  (1.626 m).   Weight as of this encounter: 96 lb 6.4 oz (43.7 kg).  @WEIGHTCHANGE @  [employer]   03/05/21 0943  Weight: 96 lb 6.4 oz (43.7 kg)     Physical ExamGeneral: No distress.  Cachexia Neuro: Alert and Oriented x 3. GCS 15. Speech normal Psych: Pleasant Resp:  Barrel Chest - yes.  Wheeze - no, Crackles - no, No overt respiratory distress CVS: Normal heart sounds. Murmurs - no Ext: Stigmata of Connective Tissue Disease -  no HEENT: Normal upper airway. PEERL +. No post nasal drip        Assessment:       ICD-10-CM   1. Chronic respiratory failure with hypoxia (HCC)  J96.11     2. Centrilobular emphysema ([place])  J43.2     3. Stage 3 severe COPD by GOLD classification ([place])  J44.9     4. Pulmonary nodules  R91.8          Plan:     Patient Instructions     ICD-10-CM   1. Chronic respiratory failure with hypoxia (HCC)  J96.11     2. Centrilobular emphysema ([place])  J43.2     3. Stage 3 severe COPD by GOLD classification ([place])  J44.9     4. Pulmonary nodules  R91.8        Clearly much improved and are more stable and stronger after recent exacerbation Christmas 2021 Currently stable Too bad for unclear reasons that has been delayed with pulmonary rehabilitation at [facility] We discussed long-term prednisone -and appreciate and respect your interest Glad you are tolerating Roflumilast  Plan -Continue oxygen 2 L nasal cannula continuous -Continue BREZTRI 2 puff 2 times daily  -Use albuterol as needed for emergency rescue [understand that inhaler is not helping you but nebulizer can] -Continue Roflumilast daily -Rerefer pulmonary rehabilitation at [place] chronic prednisone daily 5 mg/day  -Register with primary care physician and discuss potential osteoporosis management especially with chronic prednisone -Do CT chest without contrast in December 2022 [1 year follow-up for multiple lung nodules] - -  Do spirometry and DLCO pre and postbronchodilator in December 2022  Follow-up -Return to see Dr. Chase Caller in December 2022 or sooner if needed  -Any flareups or any problems call us sooner    SIGNATURE    Dr. Brand Males, M.D., F.C.C.P,  Pulmonary and Critical Care Medicine Staff Physician, [place] Director - Interstitial Lung Disease  Program  Pulmonary [place] at [place], [place],  [address]  Pager: [phone], If no answer or between  15:00h - 7:00h: call [phone] Telephone: [phone]  10:18 AM 03/05/2021

## 2021-03-05 NOTE — Patient Instructions (Addendum)
ICD-10-CM   1. Chronic respiratory failure with hypoxia (HCC)  J96.11     2. Centrilobular emphysema ([place])  J43.2     3. Stage 3 severe COPD by GOLD classification ([place])  J44.9     4. Pulmonary nodules  R91.8        Clearly much improved and are more stable and stronger after recent exacerbation Christmas 2021 Currently stable Too bad for unclear reasons that has been delayed with pulmonary rehabilitation at [facility] We discussed long-term prednisone -and appreciate and respect your interest Glad you are tolerating Roflumilast  Plan -Continue oxygen 2 L nasal cannula continuous -Continue BREZTRI 2 puff 2 times daily  -Use albuterol as needed for emergency rescue [understand that inhaler is not helping you but nebulizer can] -Continue Roflumilast daily -Rerefer pulmonary rehabilitation at [place] chronic prednisone daily 5 mg/day  -Register with primary care physician and discuss potential osteoporosis management especially with chronic prednisone -Do CT chest without contrast in December 2022 [1 year follow-up for multiple lung nodules] - -Do spirometry and DLCO pre and postbronchodilator in December 2022  Follow-up -Return to see Dr. Chase Caller in December 2022 or sooner if needed  -Any flareups or any problems call us sooner

## 2021-03-07 ENCOUNTER — Telehealth (HOSPITAL_COMMUNITY): Payer: Self-pay

## 2021-03-07 ENCOUNTER — Encounter (HOSPITAL_COMMUNITY): Payer: Self-pay

## 2021-03-07 NOTE — Telephone Encounter (Signed)
Recv'ed 2nd PR referral for this pt, attempted to contact pt in regards to PR. LMTCB   Mailed letter

## 2021-03-12 ENCOUNTER — Telehealth (HOSPITAL_COMMUNITY): Payer: Self-pay

## 2021-03-12 NOTE — Telephone Encounter (Signed)
Pt returned PR phone call and stated she would like to attend PR here at [facility].

## 2021-03-15 ENCOUNTER — Telehealth (HOSPITAL_COMMUNITY): Payer: Self-pay

## 2021-03-15 NOTE — Telephone Encounter (Signed)
Pt called and stated she is interested in PR here at [facility]. Adv pt of her insurance benefits and of backlog of 1-3 months.

## 2021-03-15 NOTE — Telephone Encounter (Signed)
Pt insurance is active and benefits verified through Medicare A/B. Co-pay $0.00, DED $233.00/$233.00 met, out of pocket $0.00/$0.00 met, co-insurance 20%. No pre-authorization required. 03/15/21 @ 223PM   Will contact patient to see if she is interested in the Pulmonary Rehab Program.

## 2021-03-19 ENCOUNTER — Encounter (HOSPITAL_COMMUNITY): Payer: Self-pay | Admitting: *Deleted

## 2021-03-19 IMAGING — CT CT CHEST W/O CM
1 of 2 series · 14 of 30 positions shown, 18 images · non-contrast
Comparison: June 28, 2019

CLINICAL DATA: Follow-up pulmonary nodules.

EXAM:
CT CHEST WITHOUT CONTRAST
TECHNIQUE: Multidetector CT imaging of the chest was performed following the
standard protocol without IV contrast.

[Series 2: chest w/(date) · axial · 0.60mm/px · z∈[-266,-4]mm · 14 of 153 slices shown, 18 images]
[im 11/153  mediastinal]
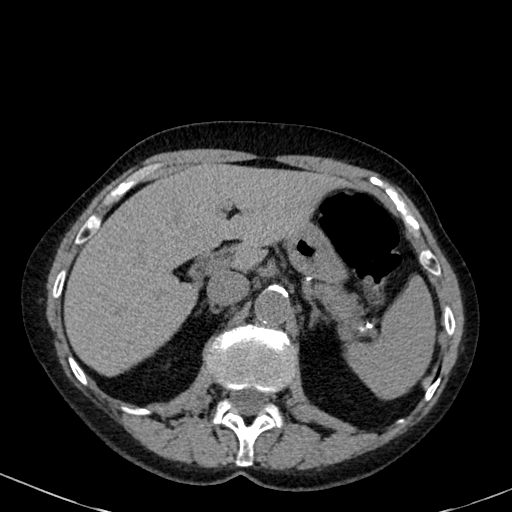
[im 11/153  lung]
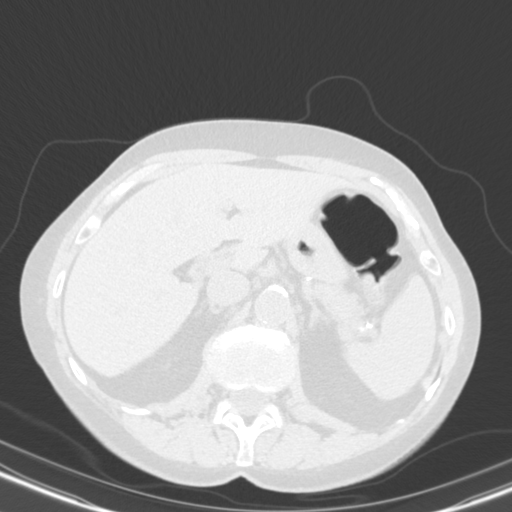
[im 22/153  lung]
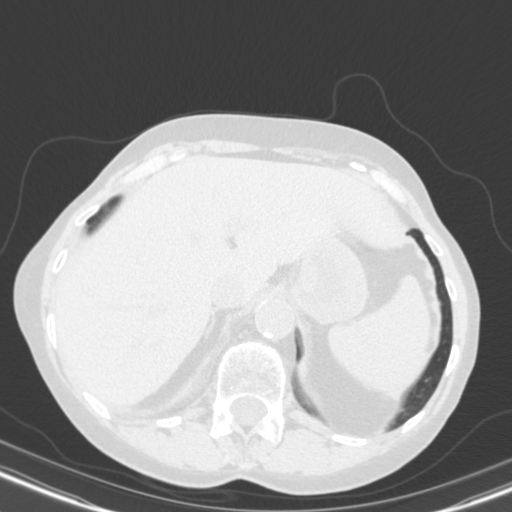
[im 33/153  lung]
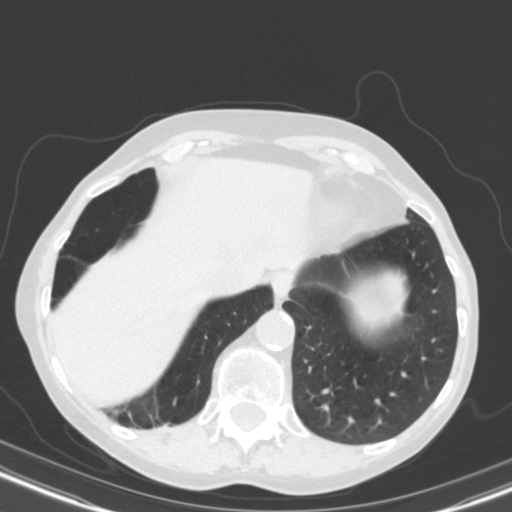
[im 44/153  lung]
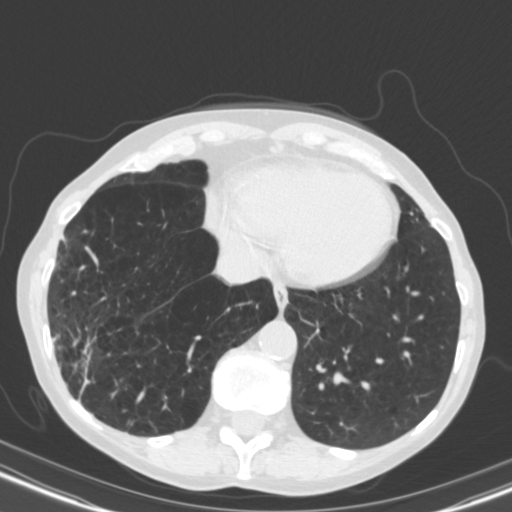
[im 55/153  mediastinal]
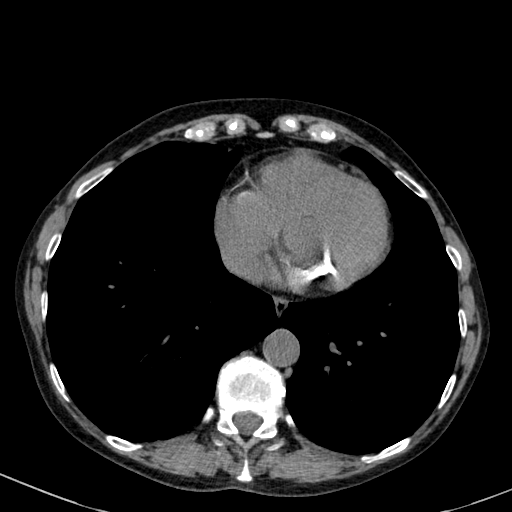
[im 55/153  lung]
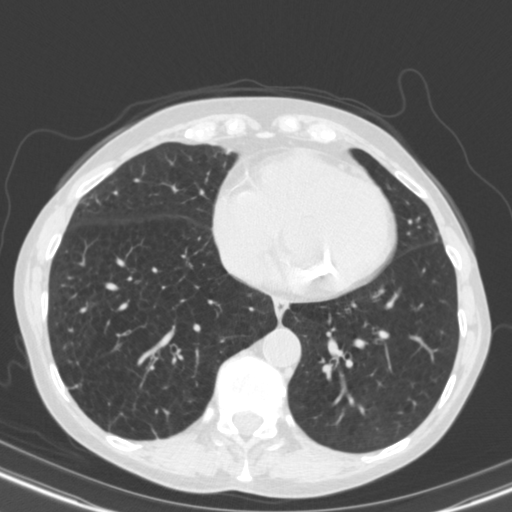
[im 66/153  lung]
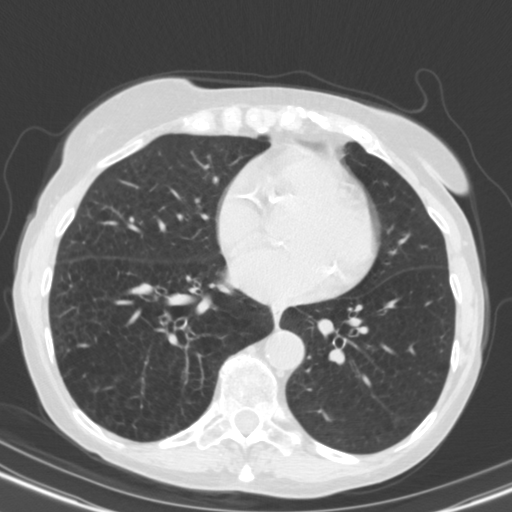
[im 72/153  lung]
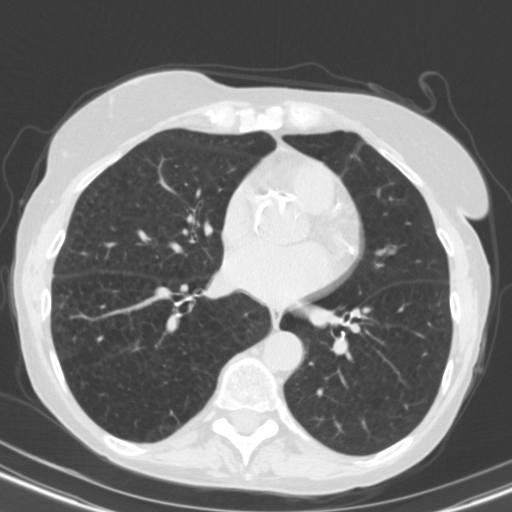
[im 77/153  lung]
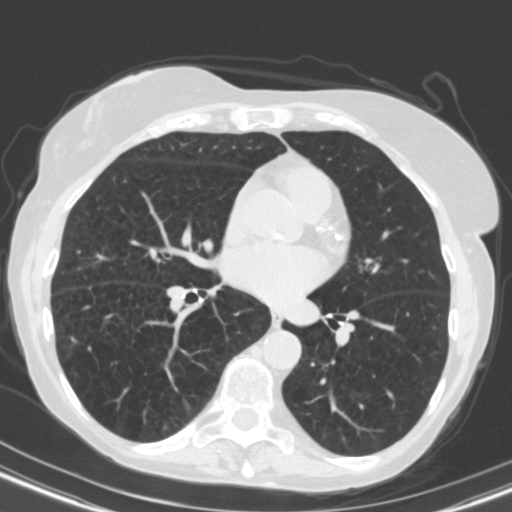
[im 87/153  mediastinal]
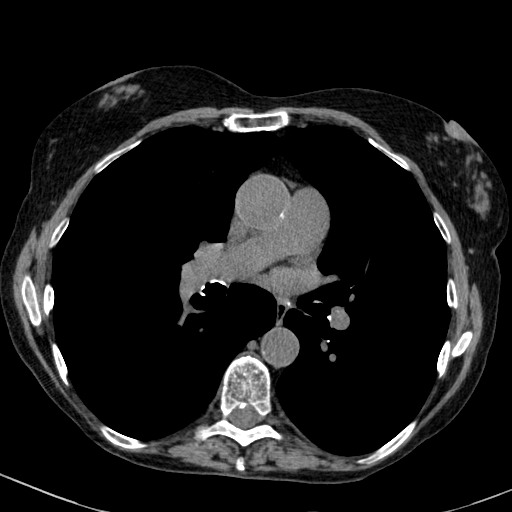
[im 87/153  lung]
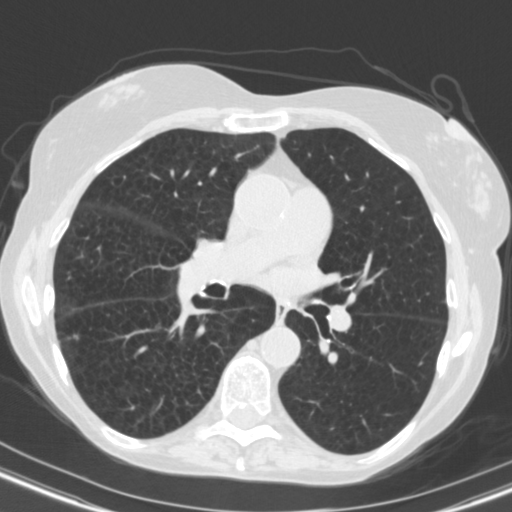
[im 98/153  lung]
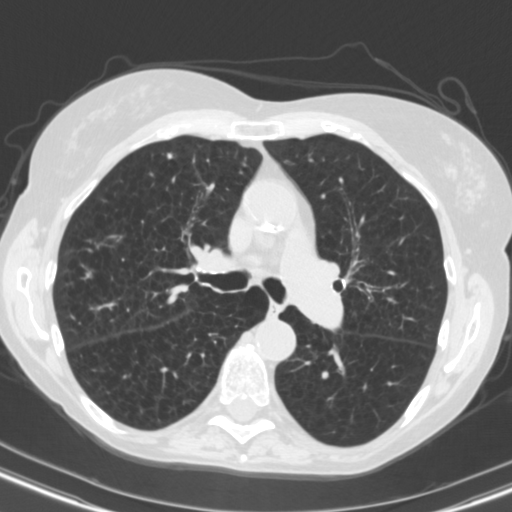
[im 109/153  lung]
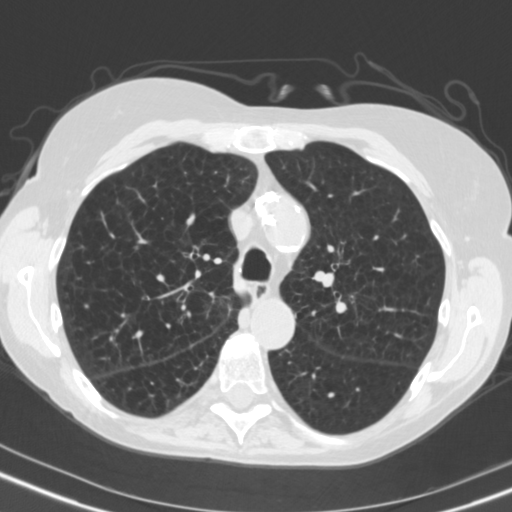
[im 120/153  lung]
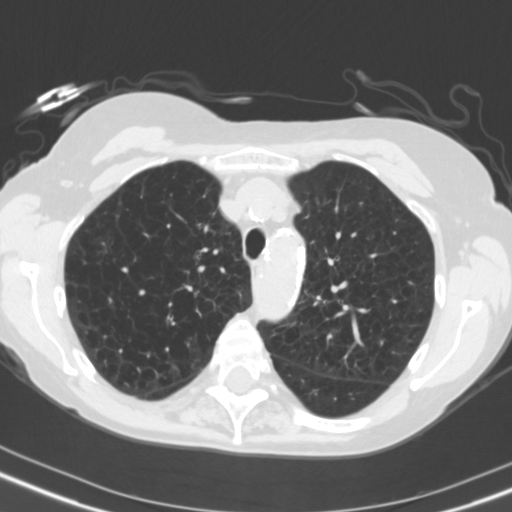
[im 131/153  mediastinal]
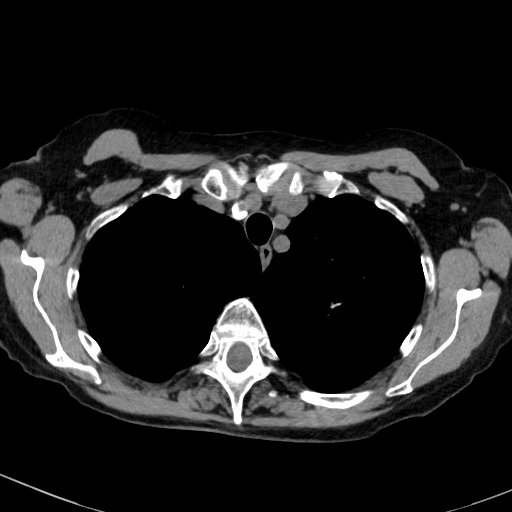
[im 131/153  lung]
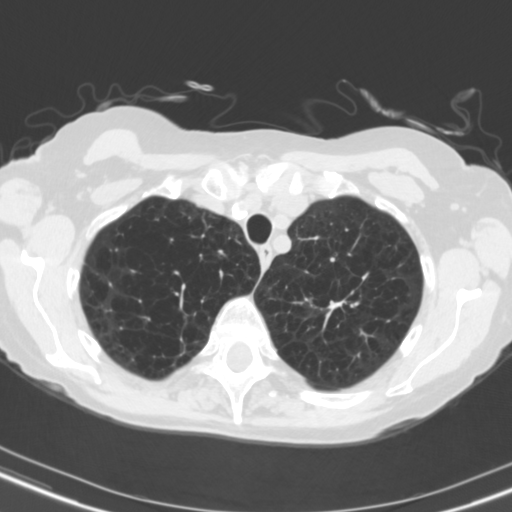
[im 142/153  lung]
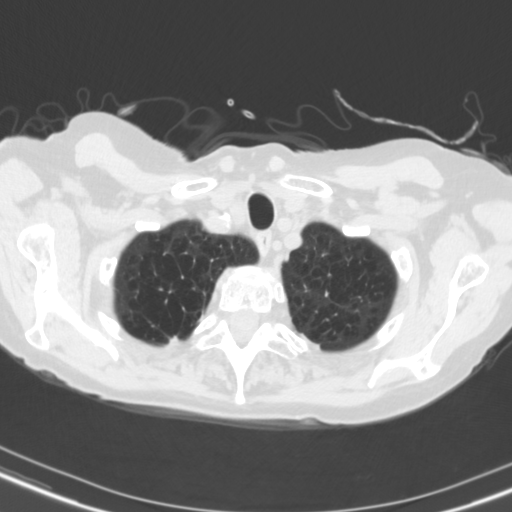

[14 of 30 positions shown; findings below may reference images not displayed]

FINDINGS: Cardiovascular: Moderate to severe atherosclerotic changes in the
nonaneurysmal aorta. Central pulmonary arteries are normal. Coronary
artery calcifications remain. The heart is normal.

Mediastinum/Nodes: No enlarged mediastinal or axillary lymph nodes.
Thyroid gland, trachea, and esophagus demonstrate no significant
findings.

Lungs/Pleura: Central airways are normal. No pneumothorax. Severe
centrilobular emphysema.

No significant change in the irregular pulmonary nodule in the
peripheral right upper lobe continuing to measure approximately 12 x
5 mm on series 5, image 59.

Unchanged bandlike and partially calcified nodule in the left apex
measuring approximately 9 x 4 mm on series 5, image 22 of today's
study.

Stable 3 mm pulmonary nodule in the anterior right upper lobe on
series 5, image 56.

Scarring remains in the periphery of the right lower lobe, stable.
No new nodules or masses. No focal infiltrates.

Upper Abdomen: No acute abnormality.

Musculoskeletal: No chest wall mass or suspicious bone lesions
identified.
IMPRESSION: 1. Stable pulmonary nodules as above. Given severe emphysema,
consider continued annual low-dose CT lung cancer screening if
indicated by age, smoking history, and other risk factors for lung
cancer.
2. Moderate to severe atherosclerosis in the nonaneurysmal aorta.
3. Severe emphysema.
4. Coronary artery calcifications.

Aortic Atherosclerosis ([id number]-GJA.A).

## 2021-03-19 NOTE — Progress Notes (Signed)
Received referral from Dr. Chase Caller for this pt to participate in pulmonary rehab with the the diagnosis of  Chronic Respiratory Failure with Hypoxia. Clinical review of pt follow up appt on 7/5 Pulmonary office note.  Pt with Covid Risk Score - 4. Pt appropriate for scheduling for Pulmonary rehab.  Will forward to support staff for scheduling and verification of insurance eligibility/benefits with pt consent. Cherre Huger, BSN Cardiac and Training and development officer

## 2021-03-20 ENCOUNTER — Telehealth: Payer: Self-pay | Admitting: Internal Medicine

## 2021-03-20 MED ORDER — MOLNUPIRAVIR EUA 200MG CAPSULE: 4.0000 | ORAL_CAPSULE | Freq: Two times a day (BID) | ORAL | 0 refills | Status: AC

## 2021-03-20 NOTE — Telephone Encounter (Signed)
Called and spoke with patient. She stated that she tested positive for covid today at 1pm. She began to have symptoms 2 days ago (03/18/21) of increased fatigue and sore throat. She only has the extreme fatigue now. Her entire family has tested positive as well. She is still using 2L of O2. Does not have any increased SOB, fevers or body aches.   Pharmacy is [facility] on [address].   RA, can you please advise? Thanks!

## 2021-03-20 NOTE — Telephone Encounter (Signed)
Called and spoke with patient. She verbalized understanding and wishes to have the antiviral sent to [facility]. RX has been sent. Nothing further needed at time of call.

## 2021-03-20 NOTE — Telephone Encounter (Signed)
Tried calling the pt and there was no answer so LMTCB

## 2021-03-26 ENCOUNTER — Telehealth: Payer: Self-pay

## 2021-03-26 ENCOUNTER — Inpatient Hospital Stay: Payer: Medicare Other | Attending: Hematology & Oncology

## 2021-03-26 ENCOUNTER — Inpatient Hospital Stay (HOSPITAL_BASED_OUTPATIENT_CLINIC_OR_DEPARTMENT_OTHER): Payer: Medicare Other | Admitting: Hematology & Oncology

## 2021-03-26 ENCOUNTER — Encounter: Payer: Self-pay | Admitting: Hematology & Oncology

## 2021-03-26 ENCOUNTER — Other Ambulatory Visit: Payer: Self-pay

## 2021-03-26 VITALS — BP 159/54 | HR 77 | Temp 97.8°F | Resp 18 | Wt 96.0 lb

## 2021-03-26 DIAGNOSIS — J439 Emphysema, unspecified: Secondary | ICD-10-CM | POA: Diagnosis not present

## 2021-03-26 DIAGNOSIS — D473 Essential (hemorrhagic) thrombocythemia: Secondary | ICD-10-CM

## 2021-03-26 DIAGNOSIS — D75839 Thrombocytosis, unspecified: Secondary | ICD-10-CM

## 2021-03-26 DIAGNOSIS — D649 Anemia, unspecified: Secondary | ICD-10-CM

## 2021-03-26 DIAGNOSIS — D509 Iron deficiency anemia, unspecified: Secondary | ICD-10-CM

## 2021-03-26 LAB — CBC WITH DIFFERENTIAL (CANCER CENTER ONLY)
Abs Immature Granulocytes: 0.07 10*3/uL (ref 0.00–0.07)
Basophils Absolute: 0 10*3/uL (ref 0.0–0.1)
Basophils Relative: 1 %
Eosinophils Absolute: 0 10*3/uL (ref 0.0–0.5)
Eosinophils Relative: 0 %
HCT: 38 % (ref 36.0–46.0)
Hemoglobin: 12.3 g/dL (ref 12.0–15.0)
Immature Granulocytes: 2 %
Lymphocytes Relative: 35 %
Lymphs Abs: 1.7 10*3/uL (ref 0.7–4.0)
MCH: 29.7 pg (ref 26.0–34.0)
MCHC: 32.4 g/dL (ref 30.0–36.0)
MCV: 91.8 fL (ref 80.0–100.0)
Monocytes Absolute: 0.3 10*3/uL (ref 0.1–1.0)
Monocytes Relative: 7 %
Neutro Abs: 2.7 10*3/uL (ref 1.7–7.7)
Neutrophils Relative %: 55 %
Platelet Count: 378 10*3/uL (ref 150–400)
RBC: 4.14 MIL/uL (ref 3.87–5.11)
RDW: 18.1 % — ABNORMAL HIGH (ref 11.5–15.5)
WBC Count: 4.8 10*3/uL (ref 4.0–10.5)
nRBC: 0 % (ref 0.0–0.2)

## 2021-03-26 LAB — CMP (CANCER CENTER ONLY)
ALT: 34 U/L (ref 0–44)
AST: 30 U/L (ref 15–41)
Albumin: 4.5 g/dL (ref 3.5–5.0)
Alkaline Phosphatase: 48 U/L (ref 38–126)
Anion gap: 8 (ref 5–15)
BUN: 12 mg/dL (ref 8–23)
CO2: 32 mmol/L (ref 22–32)
Calcium: 10 mg/dL (ref 8.9–10.3)
Chloride: 99 mmol/L (ref 98–111)
Creatinine: 0.78 mg/dL (ref 0.44–1.00)
GFR, Estimated: >60 mL/min (ref >60)
Glucose, Bld: 102 mg/dL — ABNORMAL HIGH (ref 70–99)
Potassium: 4.5 mmol/L (ref 3.5–5.1)
Sodium: 139 mmol/L (ref 135–145)
Total Bilirubin: 0.7 mg/dL (ref 0.3–1.2)
Total Protein: 7.2 g/dL (ref 6.5–8.1)

## 2021-03-26 LAB — IRON AND TIBC
Iron: 105 ug/dL (ref 41–142)
Saturation Ratios: 32 % (ref 21–57)
TIBC: 330 ug/dL (ref 236–444)
UIBC: 226 ug/dL (ref 120–384)

## 2021-03-26 LAB — SAVE SMEAR(SSMR), FOR PROVIDER SLIDE REVIEW

## 2021-03-26 LAB — LACTATE DEHYDROGENASE: LDH: 165 U/L (ref 98–192)

## 2021-03-26 LAB — FERRITIN: Ferritin: 120 ng/mL (ref 11–307)

## 2021-03-26 NOTE — Telephone Encounter (Signed)
Appts made per 03/26/21 los and a calendar has been mailed to her  Ladislav Caselli

## 2021-03-26 NOTE — Progress Notes (Signed)
Hematology and Oncology Follow Up Visit  Cheryl Haley [id number] 08-12-1956 65 y.o. 03/26/2021   Principle Diagnosis:  Essential thrombocythemia, JAK2 V617F+; high risk by IPSET    Current Therapy: ASA '81mg'$  daily Hydrea, currently '500mg'$  every 2 days, 10/2019 - present   Interim History:  Cheryl Haley is here today for follow-up.  She apparently had COVID a week ago.  She was on oral therapy for this.  She was not hospitalized.  I am surprised by this given that she has significant underlying COPD.  She is doing well on the Hydrea.  She takes Hydrea every other day.  She has had no problems with nausea or vomiting.  There has been no diarrhea.  She has had no issues with bleeding or bruising.  She has had no cough..  There is been no leg swelling.  She does wear supplemental oxygen.  She is on 2 L a day.  Overall, I would say performance status is probably ECOG 2.     Medications:  Allergies as of 03/26/2021   No Known Allergies      Medication List        Accurate as of March 26, 2021 10:40 AM. If you have any questions, ask your nurse or doctor.          albuterol 108 (90 Base) MCG/ACT inhaler Commonly known as: VENTOLIN HFA Inhale 2 puffs into the lungs every 6 (six) hours as needed for wheezing or shortness of breath.   aspirin EC 81 MG tablet Take 81 mg daily by mouth.   Breztri Aerosphere 160-9-4.8 MCG/ACT Aero Generic drug: Budeson-Glycopyrrol-Formoterol INHALE 2 PUFFS IN THE MORNING AND AT BEDTIME   feeding supplement Liqd Take 237 mLs by mouth 2 (two) times daily between meals.   hydroxyurea 500 MG capsule Commonly known as: HYDREA TAKE 1 CAPSULE BY MOUTH EVERY OTHER DAY *MAY  TAKE  WITH  FOOD  TO  MINIMZE  GI  SIDE  EFFECTS*   levothyroxine 75 MCG tablet Commonly known as: SYNTHROID Take 75 mcg by mouth daily before breakfast.   metoprolol succinate 25 MG 24 hr tablet Commonly known as: TOPROL-XL Take 25 mg by mouth daily.   OXYGEN Inhale 2  L into the lungs continuous.   predniSONE 5 MG tablet Commonly known as: DELTASONE Take 1 tablet (5 mg total) by mouth daily with breakfast.   roflumilast 500 MCG Tabs tablet Commonly known as: DALIRESP Take 1 tablet (500 mcg total) by mouth daily.   rosuvastatin 40 MG tablet Commonly known as: CRESTOR Take 1 tablet (40 mg total) by mouth daily.        Allergies: No Known Allergies  Past Medical History, Surgical history, Social history, and Family History were reviewed and updated.  Review of Systems: All other 10 point review of systems is negative.   Physical Exam:  weight is 96 lb (43.5 kg). Her oral temperature is 97.8 F (36.6 C). Her blood pressure is 159/54 (abnormal) and her pulse is 77. Her respiration is 18 and oxygen saturation is 99%.   Wt Readings from Last 3 Encounters:  03/26/21 96 lb (43.5 kg)  03/05/21 96 lb 6.4 oz (43.7 kg)  10/23/20 106 lb (48.1 kg)    Ocular: Sclerae unicteric, pupils equal, round and reactive to light Ear-nose-throat: Oropharynx clear, dentition fair Lymphatic: No cervical or supraclavicular adenopathy Lungs no rales or rhonchi, good excursion bilaterally Heart regular rate and rhythm, no murmur appreciated Abd soft, nontender, positive bowel sounds MSK no focal  spinal tenderness, no joint edema Neuro: non-focal, well-oriented, appropriate affect Breasts: Deferred   Lab Results  Component Value Date   WBC 4.8 03/26/2021   HGB 12.3 03/26/2021   HCT 38.0 03/26/2021   MCV 91.8 03/26/2021   PLT 378 03/26/2021   Lab Results  Component Value Date   FERRITIN 400 (H) 09/25/2020   IRON 95 09/25/2020   TIBC 260 09/25/2020   UIBC 165 09/25/2020   IRONPCTSAT 37 09/25/2020   Lab Results  Component Value Date   RBC 4.14 03/26/2021   No results found for: KPAFRELGTCHN, LAMBDASER, KAPLAMBRATIO No results found for: Kandis Cocking, IGMSERUM Lab Results  Component Value Date   TOTALPROTELP 6.8 09/23/2018   ALBUMINELP 3.9  09/23/2018   A1GS 0.1 09/23/2018   A2GS 0.9 09/23/2018   BETS 0.8 09/23/2018   GAMS 1.0 09/23/2018   MSPIKE Not Observed 09/23/2018   SPEI Comment 09/23/2018     Chemistry      Component Value Date/Time   NA 139 03/26/2021 1005   K 4.5 03/26/2021 1005   CL 99 03/26/2021 1005   CO2 32 03/26/2021 1005   BUN 12 03/26/2021 1005   CREATININE 0.78 03/26/2021 1005      Component Value Date/Time   CALCIUM 10.0 03/26/2021 1005   ALKPHOS 48 03/26/2021 1005   AST 30 03/26/2021 1005   ALT 34 03/26/2021 1005   BILITOT 0.7 03/26/2021 1005       Impression and Plan: Cheryl Haley is a very pleasant 65 yo female with essential thrombocythemia, JAK2 V617F+; high risk by IPSET.   She is tolerating Hydrea every other day and aspirin daily nicely.   Her platelet count is up a little bit more.  We will have to watch this closely.  Have a still think that we can get her back in 6 months.  If she has any problems between now and then, we will be more than happy to get her back.    Volanda Napoleon, MD 7/26/202210:40 AM

## 2021-03-28 ENCOUNTER — Emergency Department (HOSPITAL_COMMUNITY): Payer: Medicare Other

## 2021-03-28 ENCOUNTER — Encounter (HOSPITAL_COMMUNITY): Payer: Self-pay | Admitting: Emergency Medicine

## 2021-03-28 ENCOUNTER — Inpatient Hospital Stay (HOSPITAL_COMMUNITY)
Admission: EM | Admit: 2021-03-28 | Discharge: 2021-04-01 | DRG: 190 | Disposition: A | Discharge status: Home / Self Care | Payer: Medicare Other | Attending: Family Medicine | Admitting: Family Medicine

## 2021-03-28 DIAGNOSIS — Z8249 Family history of ischemic heart disease and other diseases of the circulatory system: Secondary | ICD-10-CM | POA: Diagnosis not present

## 2021-03-28 DIAGNOSIS — Z7952 Long term (current) use of systemic steroids: Secondary | ICD-10-CM

## 2021-03-28 DIAGNOSIS — Z79899 Other long term (current) drug therapy: Secondary | ICD-10-CM | POA: Diagnosis not present

## 2021-03-28 DIAGNOSIS — J9611 Chronic respiratory failure with hypoxia: Secondary | ICD-10-CM | POA: Diagnosis not present

## 2021-03-28 DIAGNOSIS — D75839 Thrombocytosis, unspecified: Secondary | ICD-10-CM | POA: Diagnosis present

## 2021-03-28 DIAGNOSIS — I10 Essential (primary) hypertension: Secondary | ICD-10-CM | POA: Diagnosis present

## 2021-03-28 DIAGNOSIS — Z7982 Long term (current) use of aspirin: Secondary | ICD-10-CM | POA: Diagnosis not present

## 2021-03-28 DIAGNOSIS — D473 Essential (hemorrhagic) thrombocythemia: Secondary | ICD-10-CM | POA: Diagnosis not present

## 2021-03-28 DIAGNOSIS — F419 Anxiety disorder, unspecified: Secondary | ICD-10-CM | POA: Diagnosis present

## 2021-03-28 DIAGNOSIS — D72829 Elevated white blood cell count, unspecified: Secondary | ICD-10-CM | POA: Diagnosis present

## 2021-03-28 DIAGNOSIS — I493 Ventricular premature depolarization: Secondary | ICD-10-CM | POA: Diagnosis present

## 2021-03-28 DIAGNOSIS — J441 Chronic obstructive pulmonary disease with (acute) exacerbation: Secondary | ICD-10-CM | POA: Diagnosis present

## 2021-03-28 DIAGNOSIS — J439 Emphysema, unspecified: Principal | ICD-10-CM | POA: Diagnosis present

## 2021-03-28 DIAGNOSIS — Z8673 Personal history of transient ischemic attack (TIA), and cerebral infarction without residual deficits: Secondary | ICD-10-CM | POA: Diagnosis not present

## 2021-03-28 DIAGNOSIS — E785 Hyperlipidemia, unspecified: Secondary | ICD-10-CM | POA: Diagnosis present

## 2021-03-28 DIAGNOSIS — E039 Hypothyroidism, unspecified: Secondary | ICD-10-CM | POA: Diagnosis not present

## 2021-03-28 DIAGNOSIS — Z9981 Dependence on supplemental oxygen: Secondary | ICD-10-CM

## 2021-03-28 DIAGNOSIS — U071 COVID-19: Secondary | ICD-10-CM | POA: Diagnosis present

## 2021-03-28 DIAGNOSIS — Z87891 Personal history of nicotine dependence: Secondary | ICD-10-CM

## 2021-03-28 DIAGNOSIS — Z825 Family history of asthma and other chronic lower respiratory diseases: Secondary | ICD-10-CM

## 2021-03-28 LAB — CBC WITH DIFFERENTIAL/PLATELET
Abs Immature Granulocytes: 0.04 10*3/uL (ref 0.00–0.07)
Basophils Absolute: 0 10*3/uL (ref 0.0–0.1)
Basophils Relative: 0 %
Eosinophils Absolute: 0 10*3/uL (ref 0.0–0.5)
Eosinophils Relative: 0 %
HCT: 39.5 % (ref 36.0–46.0)
Hemoglobin: 12.8 g/dL (ref 12.0–15.0)
Immature Granulocytes: 0 %
Lymphocytes Relative: 7 %
Lymphs Abs: 0.8 10*3/uL (ref 0.7–4.0)
MCH: 30.1 pg (ref 26.0–34.0)
MCHC: 32.4 g/dL (ref 30.0–36.0)
MCV: 92.9 fL (ref 80.0–100.0)
Monocytes Absolute: 0.5 10*3/uL (ref 0.1–1.0)
Monocytes Relative: 5 %
Neutro Abs: 9.4 10*3/uL — ABNORMAL HIGH (ref 1.7–7.7)
Neutrophils Relative %: 88 %
Platelets: 465 10*3/uL — ABNORMAL HIGH (ref 150–400)
RBC: 4.25 MIL/uL (ref 3.87–5.11)
RDW: 18.6 % — ABNORMAL HIGH (ref 11.5–15.5)
WBC: 10.8 10*3/uL — ABNORMAL HIGH (ref 4.0–10.5)
nRBC: 0 % (ref 0.0–0.2)

## 2021-03-28 LAB — COMPREHENSIVE METABOLIC PANEL
ALT: 32 U/L (ref 0–44)
AST: 31 U/L (ref 15–41)
Albumin: 4.6 g/dL (ref 3.5–5.0)
Alkaline Phosphatase: 48 U/L (ref 38–126)
Anion gap: 9 (ref 5–15)
BUN: 12 mg/dL (ref 8–23)
CO2: 30 mmol/L (ref 22–32)
Calcium: 9.7 mg/dL (ref 8.9–10.3)
Chloride: 99 mmol/L (ref 98–111)
Creatinine, Ser: 0.6 mg/dL (ref 0.44–1.00)
GFR, Estimated: >60 mL/min (ref >60)
Glucose, Bld: 129 mg/dL — ABNORMAL HIGH (ref 70–99)
Potassium: 4.2 mmol/L (ref 3.5–5.1)
Sodium: 138 mmol/L (ref 135–145)
Total Bilirubin: 0.7 mg/dL (ref 0.3–1.2)
Total Protein: 7.7 g/dL (ref 6.5–8.1)

## 2021-03-28 LAB — RESP PANEL BY RT-PCR (FLU A&B, COVID) ARPGX2
Influenza A by PCR: NEGATIVE
Influenza B by PCR: NEGATIVE
SARS Coronavirus 2 by RT PCR: POSITIVE — AB

## 2021-03-28 LAB — BLOOD GAS, VENOUS
Acid-Base Excess: 2 mmol/L (ref 0.0–2.0)
Bicarbonate: 27.9 mmol/L (ref 20.0–28.0)
O2 Saturation: 69.8 %
Patient temperature: 98.6
pCO2, Ven: 52.6 mmHg (ref 44.0–60.0)
pH, Ven: 7.345 (ref 7.250–7.430)
pO2, Ven: 43.3 mmHg (ref 32.0–45.0)

## 2021-03-28 LAB — D-DIMER, QUANTITATIVE: D-Dimer, Quant: 0.29 ug/mL-FEU (ref 0.00–0.50)

## 2021-03-28 LAB — TROPONIN I (HIGH SENSITIVITY)
Troponin I (High Sensitivity): 3 ng/L (ref <18)
Troponin I (High Sensitivity): 4 ng/L (ref <18)

## 2021-03-28 MED ORDER — IPRATROPIUM BROMIDE 0.02 % IN SOLN
0.5000 mg | Freq: Once | RESPIRATORY_TRACT | Status: AC
  Administered 2021-03-28: 0.5 mg via RESPIRATORY_TRACT
  Filled 2021-03-28: qty 2.5

## 2021-03-28 MED ORDER — LORAZEPAM 2 MG/ML IJ SOLN
0.5000 mg | Freq: Once | INTRAMUSCULAR | Status: AC
  Administered 2021-03-28: 0.5 mg via INTRAVENOUS
  Filled 2021-03-28: qty 1

## 2021-03-28 MED ORDER — IPRATROPIUM-ALBUTEROL 0.5-2.5 (3) MG/3ML IN SOLN
3.0000 mL | Freq: Four times a day (QID) | RESPIRATORY_TRACT | Status: DC
  Filled 2021-03-28: qty 3

## 2021-03-28 MED ORDER — METOPROLOL SUCCINATE ER 25 MG PO TB24
25.0000 mg | ORAL_TABLET | Freq: Every day | ORAL | Status: DC
  Administered 2021-03-29 – 2021-04-01 (×4): 25 mg via ORAL
  Filled 2021-03-28 (×4): qty 1

## 2021-03-28 MED ORDER — ROFLUMILAST 500 MCG PO TABS
500.0000 ug | ORAL_TABLET | Freq: Every day | ORAL | Status: DC
  Administered 2021-03-29 – 2021-04-01 (×4): 500 ug via ORAL
  Filled 2021-03-28 (×4): qty 1

## 2021-03-28 MED ORDER — MAGNESIUM SULFATE 2 GM/50ML IV SOLN
2.0000 g | Freq: Once | INTRAVENOUS | Status: AC
  Administered 2021-03-28: 2 g via INTRAVENOUS
  Filled 2021-03-28: qty 50

## 2021-03-28 MED ORDER — METHYLPREDNISOLONE SODIUM SUCC 125 MG IJ SOLR
125.0000 mg | Freq: Once | INTRAMUSCULAR | Status: AC
  Administered 2021-03-28: 125 mg via INTRAVENOUS
  Filled 2021-03-28: qty 2

## 2021-03-28 MED ORDER — ENOXAPARIN SODIUM 30 MG/0.3ML IJ SOSY
30.0000 mg | PREFILLED_SYRINGE | INTRAMUSCULAR | Status: DC
  Administered 2021-03-28 – 2021-03-31 (×4): 30 mg via SUBCUTANEOUS
  Filled 2021-03-28 (×4): qty 0.3

## 2021-03-28 MED ORDER — LACTATED RINGERS IV SOLN: INTRAVENOUS | Status: DC

## 2021-03-28 MED ORDER — HYDROXYUREA 500 MG PO CAPS
500.0000 mg | ORAL_CAPSULE | ORAL | Status: DC
  Administered 2021-03-29 – 2021-03-31 (×2): 500 mg via ORAL
  Filled 2021-03-28 (×4): qty 1

## 2021-03-28 MED ORDER — CEFTRIAXONE SODIUM 1 G IJ SOLR
1.0000 g | INTRAMUSCULAR | Status: DC
  Administered 2021-03-28 – 2021-03-31 (×4): 1 g via INTRAVENOUS
  Filled 2021-03-28: qty 10
  Filled 2021-03-28 (×3): qty 1

## 2021-03-28 MED ORDER — ROSUVASTATIN CALCIUM 20 MG PO TABS
40.0000 mg | ORAL_TABLET | Freq: Every day | ORAL | Status: DC
  Administered 2021-03-29 – 2021-04-01 (×4): 40 mg via ORAL
  Filled 2021-03-28 (×4): qty 2

## 2021-03-28 MED ORDER — ALBUTEROL (5 MG/ML) CONTINUOUS INHALATION SOLN
10.0000 mg/h | INHALATION_SOLUTION | Freq: Once | RESPIRATORY_TRACT | Status: AC
  Administered 2021-03-28: 10 mg/h via RESPIRATORY_TRACT
  Filled 2021-03-28: qty 20

## 2021-03-28 MED ORDER — ACETAMINOPHEN 325 MG PO TABS: 650.0000 mg | ORAL_TABLET | Freq: Four times a day (QID) | ORAL | Status: DC | PRN

## 2021-03-28 MED ORDER — METHYLPREDNISOLONE SODIUM SUCC 125 MG IJ SOLR
60.0000 mg | Freq: Four times a day (QID) | INTRAMUSCULAR | Status: AC
Start: 2021-03-29 — End: 2021-03-29
  Administered 2021-03-29 (×4): 60 mg via INTRAVENOUS
  Filled 2021-03-28 (×4): qty 2

## 2021-03-28 MED ORDER — ASPIRIN EC 81 MG PO TBEC
81.0000 mg | DELAYED_RELEASE_TABLET | Freq: Every day | ORAL | Status: DC
  Administered 2021-03-29 – 2021-04-01 (×4): 81 mg via ORAL
  Filled 2021-03-28 (×4): qty 1

## 2021-03-28 MED ORDER — ACETAMINOPHEN 650 MG RE SUPP: 650.0000 mg | Freq: Four times a day (QID) | RECTAL | Status: DC | PRN

## 2021-03-28 MED ORDER — OXYCODONE HCL 5 MG PO TABS: 5.0000 mg | ORAL_TABLET | ORAL | Status: DC | PRN

## 2021-03-28 MED ORDER — ALBUTEROL SULFATE (2.5 MG/3ML) 0.083% IN NEBU: 2.5000 mg | INHALATION_SOLUTION | RESPIRATORY_TRACT | Status: DC | PRN

## 2021-03-28 MED ORDER — ALBUTEROL SULFATE (2.5 MG/3ML) 0.083% IN NEBU
5.0000 mg | INHALATION_SOLUTION | Freq: Once | RESPIRATORY_TRACT | Status: AC
  Administered 2021-03-28: 5 mg via RESPIRATORY_TRACT
  Filled 2021-03-28: qty 6

## 2021-03-28 MED ORDER — LEVOTHYROXINE SODIUM 88 MCG PO TABS
88.0000 ug | ORAL_TABLET | Freq: Every day | ORAL | Status: DC
  Administered 2021-03-29 – 2021-04-01 (×4): 88 ug via ORAL
  Filled 2021-03-28 (×4): qty 1

## 2021-03-28 MED ORDER — PREDNISONE 20 MG PO TABS
40.0000 mg | ORAL_TABLET | Freq: Every day | ORAL | Status: DC
  Administered 2021-03-30 – 2021-04-01 (×3): 40 mg via ORAL
  Filled 2021-03-28 (×3): qty 2

## 2021-03-28 NOTE — ED Notes (Signed)
RN entered room and pt had removed cont. Neb and nasal canula. Pts o2 77% on RA. RN reapplied [place], increased 02 to 6L and pts o2 improved. Pt refusing to wear/finish cont. Neb at this time.

## 2021-03-28 NOTE — H&P (Signed)
We wil History and Physical    Cheryl Haley [id number] DOB: [birth year] DOA: 03/28/2021  PCP: [name], MD   Patient coming from: Home   Chief Complaint: SOB, cough   HPI: Cheryl Haley is a 65 y.o. female with medical history significant for COPD on chronic 2 L/min supplemental oxygen requirement, recently started on 5 mg prednisone daily, essential thrombocytosis, hypertension, hypothyroidism, and history of TIA and CVA, now presenting to the emergency department for evaluation of cough and shortness of breath.  Patient reports that she developed severe fatigue on 03/19/2021, tested positive for COVID-19 the following day, seem to have recovered from that, but then developed cute onset of increased cough and shortness of breath today that has progressed throughout the course of the day and become severe.  Patient denies any chest pain, fevers, or chills.  She has fleeting improvement with her bronchodilators.  Denies leg swelling or tenderness.  ED Course: Upon arrival to the ED, patient is found to be afebrile, saturating low 90s on 2 L/min of supplemental oxygen, tachypneic, tachycardic, and with stable blood pressure.  EKG features sinus tachycardia and chest x-ray is negative for acute cardiopulmonary disease.  CMP is unremarkable.  CBC with mild leukocytosis and thrombocytosis.  D-dimer is normal and troponin is normal x2.  Patient was treated with continuous albuterol x2, Atrovent, Ativan, magnesium, and 125 mg IV Solu-Medrol in the ED.  Review of Systems:  All other systems reviewed and apart from HPI, are negative.  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     Pneumonia    Protein-calorie malnutrition, severe 09/17/2018   Pulmonary nodules 07/14/2018   07/2017 CT chest >pulmonary nodules noted.  07/2017 PET scan -Hypermetabolic LUL nodule  [id number] CT chest >decreased nodule size, new pulmonary nodules noted.  FOB -atypical cells  CT chest 07/2018 >stable nodules to smaller on established nodules . 2 new nodules RUL , RLL >CT chest 3 months    Thyroid disease    Tobacco abuse 11/28/2015   Smoked 2 packs of cigarettes daily from teenage years until age 34, quit in 2016    Unspecified hypothyroidism 09/20/2013   Viral respiratory infection 09/16/2018    Past Surgical History:  Procedure Laterality Date   ESOPHAGOGASTRODUODENOSCOPY     VIDEO BRONCHOSCOPY WITH ENDOBRONCHIAL NAVIGATION Left 07/29/2017   Procedure: VIDEO BRONCHOSCOPY WITH ENDOBRONCHIAL NAVIGATION;  Surgeon: Collene Gobble, MD;  Location: [facility];  Service: Thoracic;  Laterality: Left;    Social History:   reports that she quit smoking about 4 years ago. Her smoking use included cigarettes. She has a 80.00 pack-year smoking history. She has never used smokeless tobacco. She reports current alcohol use of about 14.0 standard drinks of alcohol per week. She reports that she does not use drugs.  No Known Allergies  Family History  Problem Relation Age of Onset   Emphysema Father    Heart disease Father    Emphysema Brother    Emphysema Sister    Emphysema Sister  Heart disease Mother    Heart disease Sister    Heart disease Brother    Heart disease Brother    Congestive Heart Failure Sister    Congestive Heart Failure Sister    Cancer Sister        lung   Cancer Sister        breast     Prior to Admission medications   Medication Sig Start Date End Date Taking? Authorizing Provider  albuterol (VENTOLIN HFA) 108 (90 Base) MCG/ACT inhaler Inhale 2 puffs into the lungs every 6 (six) hours as needed for  wheezing or shortness of breath. 09/14/20  Yes Lauraine Rinne, NP  aspirin EC 81 MG tablet Take 81 mg daily by mouth.    Yes [provider]  BREZTRI AEROSPHERE 160-9-4.8 MCG/ACT AERO INHALE 2 PUFFS IN THE MORNING AND AT BEDTIME 01/07/21  Yes Brand Males, MD  feeding supplement (ENSURE ENLIVE / ENSURE PLUS) LIQD Take 237 mLs by mouth 2 (two) times daily between meals. 08/28/20  Yes Rizwan, Eunice Blase, MD  hydroxyurea (HYDREA) 500 MG capsule TAKE 1 CAPSULE BY MOUTH EVERY OTHER DAY *MAY  TAKE  WITH  FOOD  TO  MINIMZE  GI  SIDE  EFFECTS* 11/29/20  Yes Volanda Napoleon, MD  levothyroxine (SYNTHROID) 88 MCG tablet Take 88 mcg by mouth daily before breakfast. 11/04/19  Yes [provider]  metoprolol succinate (TOPROL-XL) 25 MG 24 hr tablet Take 25 mg by mouth daily. 11/10/18  Yes [provider]  OXYGEN Inhale 2 L into the lungs continuous.   Yes [provider]  predniSONE (DELTASONE) 5 MG tablet Take 1 tablet (5 mg total) by mouth daily with breakfast. 03/05/21  Yes Brand Males, MD  roflumilast (DALIRESP) 500 MCG TABS tablet Take 1 tablet (500 mcg total) by mouth daily. 11/19/20  Yes Brand Males, MD  rosuvastatin (CRESTOR) 40 MG tablet Take 1 tablet (40 mg total) by mouth daily. 07/13/20  Yes Sueanne Margarita, MD    Physical Exam: Vitals:   03/28/21 2015 03/28/21 2030 03/28/21 2100 03/28/21 2130  BP:  124/67 (!) 120/56 108/63  Pulse: (!) 120 (!) 118 (!) 117 (!) 104  Resp: (!) 27 (!) 29 (!) 31 (!) 39  Temp:      TempSrc:      SpO2: 98% 96% 98% 99%  Weight:      Height:         Constitutional: No pallor, no diaphoresis  Eyes: PERTLA, lids and conjunctivae normal ENMT: Mucous membranes are moist. Posterior pharynx clear of any exudate or lesions.   Neck: supple, no masses  Respiratory: markedly diminished bilaterally with prolonged expiratory phase. Tachypneic. Dyspneic with speech.  Cardiovascular: Rate ~110 and regular. No extremity edema.   Abdomen:  No distension, no tenderness, soft. Bowel sounds active.  Musculoskeletal: no clubbing / cyanosis. No joint deformity upper and lower extremities.   Skin: no significant rashes, lesions, ulcers. Warm, dry, well-perfused. Neurologic: CN 2-12 grossly intact. Sensation intact. Moving all extremities.  Psychiatric: Alert and oriented to person, place, and situation. Pleasant and cooperative.    Labs and Imaging on Admission: I have personally reviewed following labs and imaging studies  CBC: Recent Labs  Lab 03/26/21 1005 03/28/21 1750  WBC 4.8 10.8*  NEUTROABS 2.7 9.4*  HGB 12.3 12.8  HCT 38.0 39.5  MCV 91.8 92.9  PLT 378 [id number]*   Basic Metabolic Panel: Recent Labs  Lab 03/26/21 1005 03/28/21 1750  NA 139 138  K  4.5 4.2  CL 99 99  CO2 32 30  GLUCOSE 102* 129*  BUN 12 12  CREATININE 0.78 0.60  CALCIUM 10.0 9.7   GFR: Estimated Creatinine Clearance: 47.5 mL/min (by C-G formula based on SCr of 0.6 mg/dL). Liver Function Tests: Recent Labs  Lab 03/26/21 1005 03/28/21 1750  AST 30 31  ALT 34 32  ALKPHOS 48 48  BILITOT 0.7 0.7  PROT 7.2 7.7  ALBUMIN 4.5 4.6   No results for input(s): LIPASE, AMYLASE in the last 168 hours. No results for input(s): AMMONIA in the last 168 hours. Coagulation Profile: No results for input(s): INR, PROTIME in the last 168 hours. Cardiac Enzymes: No results for input(s): CKTOTAL, CKMB, CKMBINDEX, TROPONINI in the last 168 hours. BNP (last 3 results) No results for input(s): PROBNP in the last 8760 hours. HbA1C: No results for input(s): HGBA1C in the last 72 hours. CBG: No results for input(s): GLUCAP in the last 168 hours. Lipid Profile: No results for input(s): CHOL, HDL, LDLCALC, TRIG, CHOLHDL, LDLDIRECT in the last 72 hours. Thyroid Function Tests: No results for input(s): TSH, T4TOTAL, FREET4, T3FREE, THYROIDAB in the last 72 hours. Anemia Panel: Recent Labs    03/26/21 1005  FERRITIN 120  TIBC 330  IRON 105   Urine  analysis:    Component Value Date/Time   COLORURINE YELLOW 08/21/2020 1822   APPEARANCEUR HAZY (A) 08/21/2020 1822   LABSPEC 1.013 08/21/2020 1822   PHURINE 6.0 08/21/2020 1822   GLUCOSEU 150 (A) 08/21/2020 1822   HGBUR SMALL (A) 08/21/2020 1822   BILIRUBINUR NEGATIVE 08/21/2020 1822   KETONESUR 5 (A) 08/21/2020 1822   PROTEINUR 100 (A) 08/21/2020 1822   UROBILINOGEN 1.0 09/20/2013 1621   NITRITE NEGATIVE 08/21/2020 1822   LEUKOCYTESUR NEGATIVE 08/21/2020 1822   Sepsis Labs: '@LABRCNTIP'$ (procalcitonin:4,lacticidven:4) ) Recent Results (from the past 240 hour(s))  Resp Panel by RT-PCR (Flu A&B, Covid) Nasopharyngeal Swab     Status: Abnormal   Collection Time: 03/28/21  7:41 PM   Specimen: Nasopharyngeal Swab; Nasopharyngeal(NP) swabs in vial transport medium  Result Value Ref Range Status   SARS Coronavirus 2 by RT PCR POSITIVE (A) NEGATIVE Final    Comment: RESULT CALLED TO, READ BACK BY AND VERIFIED WITH: J.LOWDERMILK, RN AT 2200 ON 07.28.22 BY N.THOMPSON (NOTE) SARS-CoV-2 target nucleic acids are DETECTED.  The SARS-CoV-2 RNA is generally detectable in upper respiratory specimens during the acute phase of infection. Positive results are indicative of the presence of the identified virus, but do not rule out bacterial infection or co-infection with other pathogens not detected by the test. Clinical correlation with patient history and other diagnostic information is necessary to determine patient infection status. The expected result is Negative.  Fact Sheet for Patients: EntrepreneurPulse.com.au  Fact Sheet for Healthcare Providers: IncredibleEmployment.be  This test is not yet approved or cleared by the [place] FDA and  has been authorized for detection and/or diagnosis of SARS-CoV-2 by FDA under an Emergency Use Authorization (EUA).  This EUA will remain in effect (meaning  this test can be used) for the duration of  the  COVID-19 declaration under Section 564(b)(1) of the Act, 21 U.S.C. section 360bbb-3(b)(1), unless the authorization is terminated or revoked sooner.     Influenza A by PCR NEGATIVE NEGATIVE Final   Influenza B by PCR NEGATIVE NEGATIVE Final    Comment: (NOTE) The Xpert Xpress SARS-CoV-2/FLU/RSV plus assay is intended as an aid in the diagnosis of influenza from Nasopharyngeal swab specimens and should not  be used as a sole basis for treatment. Nasal washings and aspirates are unacceptable for Xpert Xpress SARS-CoV-2/FLU/RSV testing.  Fact Sheet for Patients: EntrepreneurPulse.com.au  Fact Sheet for Healthcare Providers: IncredibleEmployment.be  This test is not yet approved or cleared by the [place] FDA and has been authorized for detection and/or diagnosis of SARS-CoV-2 by FDA under an Emergency Use Authorization (EUA). This EUA will remain in effect (meaning this test can be used) for the duration of the COVID-19 declaration under Section 564(b)(1) of the Act, 21 U.S.C. section 360bbb-3(b)(1), unless the authorization is terminated or revoked.  Performed at [facility], [place] [address], [place], [place] [address]      Radiological Exams on Admission: DG Chest Port 1 View  Result Date: 03/28/2021 CLINICAL DATA:  sob EXAM: PORTABLE CHEST 1 VIEW COMPARISON:  September 10, 2020 FINDINGS: The cardiomediastinal silhouette is unchanged in contour.Atherosclerotic calcifications of the aorta. Emphysematous changes. No pleural effusion. No pneumothorax. No acute pleuroparenchymal abnormality. Visualized abdomen is unremarkable. No acute osseous abnormality. IMPRESSION: No acute cardiopulmonary abnormality. Electronically Signed   By: Valentino Saxon MD   On: 03/28/2021 17:55    EKG: Independently reviewed. Sinus tachycardia, rate 118, PVCs, RAD.   Assessment/Plan   1. COPD with acute exacerbation; chronic hypoxic  respiratory failure  - Patient with COPD, chronic 2 Lpm O2 requirement, and on daily prednisone '5mg'$  presents with one day of increased cough and SOB  - CXR is clear, d-dimer normal, reports improvement after IV steroids and multiple neb treatments in ED but remains dyspneic and tachypneic at rest  - Check sputum culture, start Rocephin, continue systemic steroids, schedule SAMA/SABA, continue as-needed SABA    2. Hypertension  - Continue metoprolol   3. Hypothyroidism  - Continue Synthroid    4. Essential thrombocythemia  - Continue Hydrea    5. Hx of TIA and CVA  - Continue ASA and statin    6. Recent COVID-19  - She reports testing positive on 7/20 after one day of fatigue which has since resolved  - Plan to continue 10-day isolation    DVT prophylaxis: Lovenox  Code Status: Full  Level of Care: Level of care: Telemetry Family Communication: None present  Disposition Plan:  Patient is from: Home  Anticipated d/c is to: home  Anticipated d/c date is: 03/31/21 Patient currently: Pending improvement in respiratory status  Consults called: none  Admission status: Inpatient     Vianne Bulls, MD [facility]  03/28/2021, 10:13 PM

## 2021-03-28 NOTE — ED Triage Notes (Signed)
Pt BIB EMS from home c/o shob that began around noon today. Pt tested COVID + on 7/20. Also c/o weakness, fatigue and cough x 1 week. No relief with home inhaler. Hx of COPD and HTN.  2L [place] is baseline. Denies chest pain and dizziness.

## 2021-03-28 NOTE — ED Notes (Signed)
Per EDP, nebulizer is okay to be given with Pt's positive Covid status based on the pt being fever free and 8 days out from Covid dx.

## 2021-03-28 NOTE — ED Provider Notes (Signed)
[facility] DEPT Provider Note   CSN: [id number] Arrival date & time: 03/28/21  1624     History Chief Complaint  Patient presents with   Shortness of Breath    Cheryl Haley is a 65 y.o. female.  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  Patient tested positive for COVID 8 days ago and states initially she had cough and congestion but did not have significant shortness of breath.  It had improved and she was feeling better until yesterday the cough came back and sounded a little more wet.  When she was walking with a walker today in her home when she suddenly just felt very short of breath.  She tried to use her albuterol inhaler but it did not help.  She has not had any chest pain today, abdominal pain nausea or vomiting.  She did eat breakfast but has not had anything else the rest of the day.  She has not noticed any lower extremity swelling.  She does take 5 mg of prednisone per day and did have that medication today.  The history is provided by the patient.  Shortness of Breath     Past Medical History:  Diagnosis Date   Anxiety    Arthritis    "in neck"   COPD (chronic obstructive pulmonary disease) ([place])    Emphysema lung ([place])    Essential thrombocythemia ([place]) 09/15/2019   History of hiatal hernia    Hx of TIA (transient ischemic attack) and stroke    Hyperlipidemia    Hypertension    Hypothyroidism    Impaired fasting glucose    Pleural effusion 05/15/2014   CXR 05/2014:  New small right effusion.     Pneumonia    Protein-calorie malnutrition, severe 09/17/2018   Pulmonary nodules 07/14/2018   07/2017 CT chest >pulmonary nodules noted.  07/2017 PET scan -Hypermetabolic LUL nodule   [id number] CT chest >decreased nodule size, new pulmonary nodules noted.  FOB -atypical cells  CT chest 07/2018 >stable nodules to smaller on established nodules . 2 new nodules RUL , RLL >CT chest 3 months    Thyroid disease    Tobacco abuse 11/28/2015   Smoked 2 packs of cigarettes daily from teenage years until age 22, quit in 2016    Unspecified hypothyroidism 09/20/2013   Viral respiratory infection 09/16/2018    Patient Active Problem List   Diagnosis Date Noted   Oral thrush 09/10/2020   Medication management 09/10/2020   Abnormal findings on diagnostic imaging of lung 09/10/2020   Healthcare maintenance 09/10/2020   Acute on chronic respiratory failure with hypoxia and hypercapnia (HCC)    Hyponatremia 08/21/2020   Hypokalemia 08/21/2020   Sepsis without septic shock ([place]) 08/21/2020   SIRS (systemic inflammatory response syndrome) ([place])    Essential thrombocythemia ([place]) 09/15/2019   Protein-calorie malnutrition, severe 09/17/2018   Viral respiratory infection 09/16/2018   COPD exacerbation ([place]) 09/15/2018   Pulmonary nodules 07/14/2018   Pulmonary nodule, left    Chronic respiratory failure with hypoxia ([place]) 06/10/2016   Tobacco abuse 11/28/2015   Pleural effusion 05/15/2014   COPD (chronic obstructive pulmonary disease) with emphysema ([place]) 09/20/2013   COPD with acute exacerbation ([place]) 09/20/2013   HTN (hypertension) 09/20/2013   Hypothyroidism 09/20/2013  Past Surgical History:  Procedure Laterality Date   ESOPHAGOGASTRODUODENOSCOPY     VIDEO BRONCHOSCOPY WITH ENDOBRONCHIAL NAVIGATION Left 07/29/2017   Procedure: VIDEO BRONCHOSCOPY WITH ENDOBRONCHIAL NAVIGATION;  Surgeon: Collene Gobble, MD;  Location: [facility];  Service: Thoracic;  Laterality: Left;     OB History   No obstetric history on file.     Family History  Problem Relation Age of Onset   Emphysema Father    Heart disease Father    Emphysema Brother    Emphysema Sister    Emphysema Sister     Heart disease Mother    Heart disease Sister    Heart disease Brother    Heart disease Brother    Congestive Heart Failure Sister    Congestive Heart Failure Sister    Cancer Sister        lung   Cancer Sister        breast    Social History   Tobacco Use   Smoking status: Former    Packs/day: 2.00    Years: 40.00    Pack years: 80.00    Types: Cigarettes    Quit date: 03/16/2017    Years since quitting: 4.0   Smokeless tobacco: Never   Tobacco comments:       Vaping Use   Vaping Use: Never used  Substance Use Topics   Alcohol use: Yes    Alcohol/week: 14.0 standard drinks    Types: 14 Cans of beer per week   Drug use: No    Home Medications Prior to Admission medications   Medication Sig Start Date End Date Taking? Authorizing Provider  albuterol (VENTOLIN HFA) 108 (90 Base) MCG/ACT inhaler Inhale 2 puffs into the lungs every 6 (six) hours as needed for wheezing or shortness of breath. Patient not taking: Reported on 03/05/2021 09/14/20   Lauraine Rinne, NP  aspirin EC 81 MG tablet Take 81 mg daily by mouth.     [provider]  BREZTRI AEROSPHERE 160-9-4.8 MCG/ACT AERO INHALE 2 PUFFS IN THE MORNING AND AT BEDTIME 01/07/21   Brand Males, MD  feeding supplement (ENSURE ENLIVE / ENSURE PLUS) LIQD Take 237 mLs by mouth 2 (two) times daily between meals. 08/28/20   Debbe Odea, MD  hydroxyurea (HYDREA) 500 MG capsule TAKE 1 CAPSULE BY MOUTH EVERY OTHER DAY *MAY  TAKE  WITH  FOOD  TO  MINIMZE  GI  SIDE  EFFECTS* 11/29/20   Volanda Napoleon, MD  levothyroxine (SYNTHROID) 75 MCG tablet Take 75 mcg by mouth daily before breakfast. 11/04/19   [provider]  metoprolol succinate (TOPROL-XL) 25 MG 24 hr tablet Take 25 mg by mouth daily. 11/10/18   [provider]  OXYGEN Inhale 2 L into the lungs continuous.    [provider]  predniSONE (DELTASONE) 5 MG tablet Take 1 tablet (5 mg total) by mouth daily with breakfast. 03/05/21   Brand Males,  MD  roflumilast (DALIRESP) 500 MCG TABS tablet Take 1 tablet (500 mcg total) by mouth daily. 11/19/20   Brand Males, MD  rosuvastatin (CRESTOR) 40 MG tablet Take 1 tablet (40 mg total) by mouth daily. 07/13/20   Sueanne Margarita, MD    Allergies    Patient has no known allergies.  Review of Systems   Review of Systems  Respiratory:  Positive for shortness of breath.   All other systems reviewed and are negative.  Physical Exam Updated Vital Signs BP (!) 140/100 (BP Location: Right Arm)  Pulse (!) 126   Temp (!) 97.3 F (36.3 C) (Oral)   Resp (!) 22   Ht '5\' 4"'$  (1.626 m)   Wt 43.5 kg   SpO2 95%   BMI 16.48 kg/m   Physical Exam Vitals and nursing note reviewed.  Constitutional:      General: She is in acute distress.     Appearance: She is well-developed.     Comments: Patient appears anxious  HENT:     Head: Normocephalic and atraumatic.     Mouth/Throat:     Mouth: Mucous membranes are dry.  Eyes:     Pupils: Pupils are equal, round, and reactive to light.  Cardiovascular:     Rate and Rhythm: Regular rhythm. Tachycardia present.     Pulses: Normal pulses.     Heart sounds: Normal heart sounds. No murmur heard.   No friction rub.  Pulmonary:     Effort: Tachypnea and accessory muscle usage present.     Breath sounds: Decreased breath sounds present. No wheezing or rales.  Abdominal:     General: Bowel sounds are normal. There is no distension.     Palpations: Abdomen is soft.     Tenderness: There is no abdominal tenderness. There is no guarding or rebound.  Musculoskeletal:        General: No tenderness. Normal range of motion.     Cervical back: Normal range of motion and neck supple.     Right lower leg: No edema.     Left lower leg: No edema.     Comments: No edema  Skin:    General: Skin is warm and dry.     Findings: No rash.  Neurological:     Mental Status: She is alert and oriented to person, place, and time. Mental status is at baseline.      Cranial Nerves: No cranial nerve deficit.  Psychiatric:        Mood and Affect: Mood normal.        Behavior: Behavior normal.    ED Results / Procedures / Treatments   Labs (all labs ordered are listed, but only abnormal results are displayed) Labs Reviewed  CBC WITH DIFFERENTIAL/PLATELET - Abnormal; Notable for the following components:      Result Value   WBC 10.8 (*)    RDW 18.6 (*)    Platelets 465 (*)    Neutro Abs 9.4 (*)    All other components within normal limits  COMPREHENSIVE METABOLIC PANEL - Abnormal; Notable for the following components:   Glucose, Bld 129 (*)    All other components within normal limits  D-DIMER, QUANTITATIVE  I-STAT VENOUS BLOOD GAS, ED  TROPONIN I (HIGH SENSITIVITY)  TROPONIN I (HIGH SENSITIVITY)    EKG EKG Interpretation  Date/Time:  Thursday March 28 2021 17:30:30 EDT Ventricular Rate:  118 PR Interval:  154 QRS Duration: 95 QT Interval:  335 QTC Calculation: 470 R Axis:   93 Text Interpretation: Sinus tachycardia Multiple ventricular premature complexes Right atrial enlargement Right axis deviation Nonspecific repol abnormality, lateral leads Artifact in lead(s) I II III aVR aVL aVF V6 Confirmed by Blanchie Dessert [id number]) on 03/28/2021 5:36:22 PM  Radiology DG Chest Port 1 View  Result Date: 03/28/2021 CLINICAL DATA:  sob EXAM: PORTABLE CHEST 1 VIEW COMPARISON:  September 10, 2020 FINDINGS: The cardiomediastinal silhouette is unchanged in contour.Atherosclerotic calcifications of the aorta. Emphysematous changes. No pleural effusion. No pneumothorax. No acute pleuroparenchymal abnormality. Visualized abdomen is unremarkable. No acute osseous  abnormality. IMPRESSION: No acute cardiopulmonary abnormality. Electronically Signed   By: Valentino Saxon MD   On: 03/28/2021 17:55    Procedures Procedures   Medications Ordered in ED Medications  albuterol (PROVENTIL) (2.5 MG/3ML) 0.083% nebulizer solution 5 mg (has no administration in  time range)  ipratropium (ATROVENT) nebulizer solution 0.5 mg (has no administration in time range)  methylPREDNISolone sodium succinate (SOLU-MEDROL) 125 mg/2 mL injection 125 mg (has no administration in time range)  magnesium sulfate IVPB 2 g 50 mL (has no administration in time range)  lactated ringers infusion (has no administration in time range)  LORazepam (ATIVAN) injection 0.5 mg (has no administration in time range)    ED Course  I have reviewed the triage vital signs and the nursing notes.  Pertinent labs & imaging results that were available during my care of the patient were reviewed by me and considered in my medical decision making (see chart for details).    MDM Rules/Calculators/A&P                           Patient is a 65 year old female with significant COPD on 2 L of oxygen chronically presenting with significantly worsening shortness of breath today.  Patient is in distress on exam with tripoding and accessory muscle use.  Oxygen saturation is 94 to 95% on 4 L.  She denies any chest pain or abdominal pain.  Recent diagnosis of COVID approximately 8 days ago.  Patient has severely decreased breath sounds and concern for COPD exacerbation, PE, pneumonia with COVID.  Patient also appears to have some dehydration.  She is tachycardic here.  Patient given IV fluids.  Solu-Medrol, magnesium, albuterol and Atrovent are pending.  D-dimer pending.  Chest x-ray.  7:12 PM D-dimer is within normal limits, chest x-ray without acute findings.  On repeat evaluation patient is still nervous but reports she does feel a little bit less short of breath however she is still tachycardic and tachypneic.  We will do a second Atrovent and an hour-long neb.  We will plan on admitting for further care.  MDM   Amount and/or Complexity of Data Reviewed Clinical lab tests: ordered and reviewed Tests in the radiology section of CPT: ordered and reviewed Tests in the medicine section of CPT:  ordered and reviewed Decide to obtain previous medical records or to obtain history from someone other than the patient: yes Review and summarize past medical records: yes Discuss the patient with other providers: yes Independent visualization of images, tracings, or specimens: yes  Risk of Complications, Morbidity, and/or Mortality Presenting problems: high Diagnostic procedures: high Management options: high   CRITICAL CARE Performed by: Lakeshia Dohner Total critical care time: 30 minutes Critical care time was exclusive of separately billable procedures and treating other patients. Critical care was necessary to treat or prevent imminent or life-threatening deterioration. Critical care was time spent personally by me on the following activities: development of treatment plan with patient and/or surrogate as well as nursing, discussions with consultants, evaluation of patient's response to treatment, examination of patient, obtaining history from patient or surrogate, ordering and performing treatments and interventions, ordering and review of laboratory studies, ordering and review of radiographic studies, pulse oximetry and re-evaluation of patient's condition.   Final Clinical Impression(s) / ED Diagnoses Final diagnoses:  COPD exacerbation ([place])    Rx / DC Orders ED Discharge Orders     None        Aalyssa Elderkin, [place],  MD 03/28/21 [id number]

## 2021-03-29 ENCOUNTER — Encounter (HOSPITAL_COMMUNITY): Payer: Self-pay | Admitting: Family Medicine

## 2021-03-29 ENCOUNTER — Other Ambulatory Visit: Payer: Self-pay

## 2021-03-29 DIAGNOSIS — J9611 Chronic respiratory failure with hypoxia: Secondary | ICD-10-CM | POA: Diagnosis not present

## 2021-03-29 DIAGNOSIS — J441 Chronic obstructive pulmonary disease with (acute) exacerbation: Secondary | ICD-10-CM | POA: Diagnosis not present

## 2021-03-29 DIAGNOSIS — E039 Hypothyroidism, unspecified: Secondary | ICD-10-CM | POA: Diagnosis not present

## 2021-03-29 DIAGNOSIS — D473 Essential (hemorrhagic) thrombocythemia: Secondary | ICD-10-CM | POA: Diagnosis not present

## 2021-03-29 LAB — BASIC METABOLIC PANEL
Anion gap: 9 (ref 5–15)
BUN: 12 mg/dL (ref 8–23)
CO2: 27 mmol/L (ref 22–32)
Calcium: 8.6 mg/dL — ABNORMAL LOW (ref 8.9–10.3)
Chloride: 102 mmol/L (ref 98–111)
Creatinine, Ser: 0.55 mg/dL (ref 0.44–1.00)
GFR, Estimated: >60 mL/min (ref >60)
Glucose, Bld: 171 mg/dL — ABNORMAL HIGH (ref 70–99)
Potassium: 4.5 mmol/L (ref 3.5–5.1)
Sodium: 138 mmol/L (ref 135–145)

## 2021-03-29 LAB — CBC
HCT: 34.7 % — ABNORMAL LOW (ref 36.0–46.0)
Hemoglobin: 11.2 g/dL — ABNORMAL LOW (ref 12.0–15.0)
MCH: 30.3 pg (ref 26.0–34.0)
MCHC: 32.3 g/dL (ref 30.0–36.0)
MCV: 93.8 fL (ref 80.0–100.0)
Platelets: 378 10*3/uL (ref 150–400)
RBC: 3.7 MIL/uL — ABNORMAL LOW (ref 3.87–5.11)
RDW: 18.6 % — ABNORMAL HIGH (ref 11.5–15.5)
WBC: 12.3 10*3/uL — ABNORMAL HIGH (ref 4.0–10.5)
nRBC: 0 % (ref 0.0–0.2)

## 2021-03-29 MED ORDER — ALBUTEROL SULFATE HFA 108 (90 BASE) MCG/ACT IN AERS: 2.0000 | INHALATION_SPRAY | RESPIRATORY_TRACT | Status: DC | PRN

## 2021-03-29 MED ORDER — GUAIFENESIN ER 600 MG PO TB12
600.0000 mg | ORAL_TABLET | Freq: Two times a day (BID) | ORAL | Status: DC
  Administered 2021-03-29 – 2021-04-01 (×7): 600 mg via ORAL
  Filled 2021-03-29 (×7): qty 1

## 2021-03-29 MED ORDER — IPRATROPIUM-ALBUTEROL 20-100 MCG/ACT IN AERS
1.0000 | INHALATION_SPRAY | Freq: Four times a day (QID) | RESPIRATORY_TRACT | Status: DC
  Administered 2021-03-29 – 2021-04-01 (×14): 1 via RESPIRATORY_TRACT
  Filled 2021-03-29: qty 4

## 2021-03-29 NOTE — Plan of Care (Signed)
  Problem: Education: Goal: Knowledge of risk factors and measures for prevention of condition will improve Outcome: Progressing   Problem: Coping: Goal: Psychosocial and spiritual needs will be supported Outcome: Progressing   Problem: Respiratory: Goal: Will maintain a patent airway Outcome: Progressing Goal: Complications related to the disease process, condition or treatment will be avoided or minimized Outcome: Progressing   

## 2021-03-29 NOTE — Progress Notes (Signed)
PT states she knows all about the Flutter device and agrees to utilize (PT seems frustrated with overall stay but does not claim to need assistance with anything at this time- encouraged PT to let staff know if she does needs something).

## 2021-03-29 NOTE — Progress Notes (Signed)
[facility]  PROGRESS NOTE  Cheryl Haley [id number] DOB: 06-29-1956 DOA: 03/28/2021 PCP: [name], MD   Brief HPI:   65 year old female with medical history of COPD on chronic 2 L/min of oxygen, essential thrombocytosis, hypertension, hypothyroidism, history of CVA and TIA presented to ED for evaluation of cough and shortness of breath.  Patient was diagnosed with COVID-19 on 03/19/2021, and recovered from that.  Patient without increased cough and shortness of breath yesterday.  No chest pain or fever.  D-dimer was normal chest x-ray showed no acute cardiopulmonary disease.  Patient admitted with COPD exacerbation.    Subjective   Patient seen and examined, still complains of shortness of breath   Assessment/Plan:    COPD exacerbation/chronic hypoxemic respiratory failure -Patient is chronically on oxygen at home 2 L/min -At home takes prednisone 5 mg daily -Chest x-ray is clear, D-dimer normal -Continue IV Solu-Medrol 60 mg every 6 hours, Combivent inhaler 1 puff every 6 hours -We will add Mucinex 600 mg p.o. twice daily -Started on ceftriaxone  Hypertension -Continue metoprolol  Hypothyroidism -Continue Synthroid  Essential thrombocythemia -Continue hydroxyurea  History of TIA/CVA -Continue aspirin, statin  Recent COVID-19 infection -She reported positive on 03/20/2021, initially resolved -Does not appear to have COVID-19 pneumonia, chest x-ray is clear -Continue isolation for 10 days    Scheduled medications:    aspirin EC  81 mg Oral Daily   enoxaparin (LOVENOX) injection  30 mg Subcutaneous Q24H   guaiFENesin  600 mg Oral BID   hydroxyurea  500 mg Oral QODAY   Ipratropium-Albuterol  1 puff Inhalation Q6H   levothyroxine  88 mcg Oral Q0600   methylPREDNISolone (SOLU-MEDROL) injection  60 mg Intravenous Q6H   Followed by   Derrill Memo ON 03/30/2021] predniSONE  40 mg Oral Q breakfast   metoprolol succinate  25 mg Oral Daily   roflumilast  500 mcg  Oral Daily   rosuvastatin  40 mg Oral Daily         Data Reviewed:   CBG:  No results for input(s): GLUCAP in the last 168 hours.  SpO2: 99 % O2 Flow Rate (L/min): 4 L/min    Vitals:   03/29/21 1200 03/29/21 1300 03/29/21 1400 03/29/21 1500  BP: (!) 101/52 100/71 127/62 124/60  Pulse: 83 87 86 96  Resp: '20 20 20 '$ (!) 22  Temp:      TempSrc:      SpO2: 100% 98% 99% 99%  Weight:      Height:         Intake/Output Summary (Last 24 hours) at 03/29/2021 1605 Last data filed at 03/29/2021 0006 Gross per 24 hour  Intake 565.4 ml  Output --  Net 565.4 ml    07/27 1901 - 07/29 0700 In: 565.4 [I.V.:415.4] Out: -   Filed Weights   03/28/21 1640  Weight: 43.5 kg    CBC:  Recent Labs  Lab 03/26/21 1005 03/28/21 1750 03/29/21 0559  WBC 4.8 10.8* 12.3*  HGB 12.3 12.8 11.2*  HCT 38.0 39.5 34.7*  PLT 378 465* 378  MCV 91.8 92.9 93.8  MCH 29.7 30.1 30.3  MCHC 32.4 32.4 32.3  RDW 18.1* 18.6* 18.6*  LYMPHSABS 1.7 0.8  --   MONOABS 0.3 0.5  --   EOSABS 0.0 0.0  --   BASOSABS 0.0 0.0  --     Complete metabolic panel:  Recent Labs  Lab 03/26/21 1005 03/28/21 1750 03/29/21 0559  NA 139 138 138  K 4.5 4.2 4.5  CL 99 99 102  CO2 32 30 27  GLUCOSE 102* 129* 171*  BUN '12 12 12  '$ CREATININE 0.78 0.60 0.55  CALCIUM 10.0 9.7 8.6*  AST 30 31  --   ALT 34 32  --   ALKPHOS 48 48  --   BILITOT 0.7 0.7  --   ALBUMIN 4.5 4.6  --   DDIMER  --  0.29  --     No results for input(s): LIPASE, AMYLASE in the last 168 hours.  Recent Labs  Lab 03/28/21 1750 03/28/21 1941  DDIMER 0.29  --   SARSCOV2NAA  --  POSITIVE*    ------------------------------------------------------------------------------------------------------------------ No results for input(s): CHOL, HDL, LDLCALC, TRIG, CHOLHDL, LDLDIRECT in the last 72 hours.  No results found for:  HGBA1C ------------------------------------------------------------------------------------------------------------------ No results for input(s): TSH, T4TOTAL, T3FREE, THYROIDAB in the last 72 hours.  Invalid input(s): FREET3 ------------------------------------------------------------------------------------------------------------------ No results for input(s): VITAMINB12, FOLATE, FERRITIN, TIBC, IRON, RETICCTPCT in the last 72 hours.  Coagulation profile No results for input(s): INR, PROTIME in the last 168 hours. Recent Labs    03/28/21 1750  DDIMER 0.29    Cardiac Enzymes No results for input(s): CKTOTAL, CKMB, CKMBINDEX, TROPONINI in the last 168 hours.  ------------------------------------------------------------------------------------------------------------------ No results found for: BNP   Antibiotics: Anti-infectives (From admission, onward)    Start     Dose/Rate Route Frequency Ordered Stop   03/28/21 2200  cefTRIAXone (ROCEPHIN) 1 g in sodium chloride 0.9 % 100 mL IVPB        1 g 200 mL/hr over 30 Minutes Intravenous Every 24 hours 03/28/21 2156 04/02/21 2159        Radiology Reports  DG Chest Port 1 View  Result Date: 03/28/2021 CLINICAL DATA:  sob EXAM: PORTABLE CHEST 1 VIEW COMPARISON:  September 10, 2020 FINDINGS: The cardiomediastinal silhouette is unchanged in contour.Atherosclerotic calcifications of the aorta. Emphysematous changes. No pleural effusion. No pneumothorax. No acute pleuroparenchymal abnormality. Visualized abdomen is unremarkable. No acute osseous abnormality. IMPRESSION: No acute cardiopulmonary abnormality. Electronically Signed   By: Valentino Saxon MD   On: 03/28/2021 17:55      DVT prophylaxis: Lovenox  Code Status: Full code  Family Communication: No family at bedside   Consultants:   Procedures:     Objective    Physical Examination:  General-appears in no acute distress Heart-S1-S2, regular, no murmur  auscultated Lungs-bilateral  rhonchi auscultated Abdomen-soft, nontender, no organomegaly Extremities-no edema in the lower extremities Neuro-alert, oriented x3, no focal deficit noted  Status is: Inpatient  Dispo: The patient is from: Home              Anticipated d/c is to: Home              Anticipated d/c date is: 04/03/2021              Patient currently not stable for discharge  Barrier to discharge-ongoing treatment for COPD exacerbation  COVID-19 Labs  Recent Labs    03/28/21 1750  DDIMER 0.29    Lab Results  Component Value Date   SARSCOV2NAA POSITIVE (A) 03/28/2021   [place] NEGATIVE 08/21/2020    Microbiology  Recent Results (from the past 240 hour(s))  Resp Panel by RT-PCR (Flu A&B, Covid) Nasopharyngeal Swab     Status: Abnormal   Collection Time: 03/28/21  7:41 PM   Specimen: Nasopharyngeal Swab; Nasopharyngeal(NP) swabs in vial transport medium  Result Value Ref Range Status   SARS Coronavirus 2 by RT PCR POSITIVE (A) NEGATIVE  Final    Comment: RESULT CALLED TO, READ BACK BY AND VERIFIED WITH: J.LOWDERMILK, RN AT 2200 ON 07.28.22 BY N.THOMPSON (NOTE) SARS-CoV-2 target nucleic acids are DETECTED.  The SARS-CoV-2 RNA is generally detectable in upper respiratory specimens during the acute phase of infection. Positive results are indicative of the presence of the identified virus, but do not rule out bacterial infection or co-infection with other pathogens not detected by the test. Clinical correlation with patient history and other diagnostic information is necessary to determine patient infection status. The expected result is Negative.  Fact Sheet for Patients: EntrepreneurPulse.com.au  Fact Sheet for Healthcare Providers: IncredibleEmployment.be  This test is not yet approved or cleared by the [place] FDA and  has been authorized for detection and/or diagnosis of SARS-CoV-2 by FDA under an Emergency  Use Authorization (EUA).  This EUA will remain in effect (meaning  this test can be used) for the duration of  the COVID-19 declaration under Section 564(b)(1) of the Act, 21 U.S.C. section 360bbb-3(b)(1), unless the authorization is terminated or revoked sooner.     Influenza A by PCR NEGATIVE NEGATIVE Final   Influenza B by PCR NEGATIVE NEGATIVE Final    Comment: (NOTE) The Xpert Xpress SARS-CoV-2/FLU/RSV plus assay is intended as an aid in the diagnosis of influenza from Nasopharyngeal swab specimens and should not be used as a sole basis for treatment. Nasal washings and aspirates are unacceptable for Xpert Xpress SARS-CoV-2/FLU/RSV testing.  Fact Sheet for Patients: EntrepreneurPulse.com.au  Fact Sheet for Healthcare Providers: IncredibleEmployment.be  This test is not yet approved or cleared by the [place] FDA and has been authorized for detection and/or diagnosis of SARS-CoV-2 by FDA under an Emergency Use Authorization (EUA). This EUA will remain in effect (meaning this test can be used) for the duration of the COVID-19 declaration under Section 564(b)(1) of the Act, 21 U.S.C. section 360bbb-3(b)(1), unless the authorization is terminated or revoked.  Performed at [facility], [place] [address], [place], [place] [address]              Oswald Hillock   [facility] If 7PM-7AM, please contact night-coverage at [email or web address], Office  [phone]   03/29/2021, 4:05 PM  LOS: 1 day

## 2021-03-29 NOTE — ED Notes (Signed)
Patient was given a pink bag that was dropped off by her sister.

## 2021-03-29 NOTE — ED Notes (Signed)
A secure message sent to Theora Gianotti, RN and Freada Bergeron, RN was sent a message in regards to admission bed assignment.  Currently the room is dirty

## 2021-03-29 NOTE — ED Notes (Signed)
Patient is ready for admission 

## 2021-03-29 NOTE — ED Notes (Signed)
Patient was given her lunch tray. 

## 2021-03-30 DIAGNOSIS — J9611 Chronic respiratory failure with hypoxia: Secondary | ICD-10-CM | POA: Diagnosis not present

## 2021-03-30 DIAGNOSIS — D473 Essential (hemorrhagic) thrombocythemia: Secondary | ICD-10-CM | POA: Diagnosis not present

## 2021-03-30 DIAGNOSIS — I10 Essential (primary) hypertension: Secondary | ICD-10-CM | POA: Diagnosis not present

## 2021-03-30 DIAGNOSIS — J441 Chronic obstructive pulmonary disease with (acute) exacerbation: Secondary | ICD-10-CM | POA: Diagnosis not present

## 2021-03-30 LAB — BASIC METABOLIC PANEL
Anion gap: 7 (ref 5–15)
BUN: 18 mg/dL (ref 8–23)
CO2: 31 mmol/L (ref 22–32)
Calcium: 9.4 mg/dL (ref 8.9–10.3)
Chloride: 101 mmol/L (ref 98–111)
Creatinine, Ser: 0.54 mg/dL (ref 0.44–1.00)
GFR, Estimated: >60 mL/min (ref >60)
Glucose, Bld: 118 mg/dL — ABNORMAL HIGH (ref 70–99)
Potassium: 4.3 mmol/L (ref 3.5–5.1)
Sodium: 139 mmol/L (ref 135–145)

## 2021-03-30 LAB — CBC
HCT: 34 % — ABNORMAL LOW (ref 36.0–46.0)
Hemoglobin: 10.9 g/dL — ABNORMAL LOW (ref 12.0–15.0)
MCH: 30.1 pg (ref 26.0–34.0)
MCHC: 32.1 g/dL (ref 30.0–36.0)
MCV: 93.9 fL (ref 80.0–100.0)
Platelets: 397 10*3/uL (ref 150–400)
RBC: 3.62 MIL/uL — ABNORMAL LOW (ref 3.87–5.11)
RDW: 18.6 % — ABNORMAL HIGH (ref 11.5–15.5)
WBC: 12 10*3/uL — ABNORMAL HIGH (ref 4.0–10.5)
nRBC: 0 % (ref 0.0–0.2)

## 2021-03-30 NOTE — Plan of Care (Signed)
  Problem: Education: Goal: Knowledge of risk factors and measures for prevention of condition will improve Outcome: Progressing   Problem: Coping: Goal: Psychosocial and spiritual needs will be supported Outcome: Progressing   Problem: Respiratory: Goal: Will maintain a patent airway Outcome: Progressing Goal: Complications related to the disease process, condition or treatment will be avoided or minimized Outcome: Progressing   Problem: Education: Goal: Knowledge of risk factors and measures for prevention of condition will improve Outcome: Progressing   Problem: Coping: Goal: Psychosocial and spiritual needs will be supported Outcome: Progressing   Problem: Respiratory: Goal: Will maintain a patent airway Outcome: Progressing Goal: Complications related to the disease process, condition or treatment will be avoided or minimized Outcome: Progressing   

## 2021-03-30 NOTE — Plan of Care (Signed)
  Problem: Education: Goal: Knowledge of risk factors and measures for prevention of condition will improve Outcome: Progressing   Problem: Coping: Goal: Psychosocial and spiritual needs will be supported Outcome: Progressing   

## 2021-03-30 NOTE — Progress Notes (Addendum)
[facility]  PROGRESS NOTE  Cheryl Haley [id number] DOB: Apr 23, 1956 DOA: 03/28/2021 PCP: [name], MD   Brief HPI:   65 year old female with medical history of COPD on chronic 2 L/min of oxygen, essential thrombocytosis, hypertension, hypothyroidism, history of CVA and TIA presented to ED for evaluation of cough and shortness of breath.  Patient was diagnosed with COVID-19 on 03/19/2021, and recovered from that.  Patient without increased cough and shortness of breath yesterday.  No chest pain or fever.  D-dimer was normal chest x-ray showed no acute cardiopulmonary disease.  Patient admitted with COPD exacerbation.    Subjective   Patient seen and examined, breathing is significantly improved.  Still requiring 4 L/min of oxygen   Assessment/Plan:    COPD exacerbation/chronic hypoxemic respiratory failure -Patient is chronically on oxygen at home 2 L/min -At home takes prednisone 5 mg daily -Chest x-ray is clear, D-dimer normal -Continue p.o. prednisone 40 mg daily;  Combivent inhaler 1 puff every 6 hours -Continue Mucinex 600 mg p.o. twice daily -Started on ceftriaxone  Hypertension -Continue metoprolol  Hypothyroidism -Continue Synthroid  Essential thrombocythemia -Continue hydroxyurea  History of TIA/CVA -Continue aspirin, statin  Recent COVID-19 infection -She reported positive on 03/20/2021, initially resolved -Does not appear to have COVID-19 pneumonia, chest x-ray is clear -Continue isolation for 10 days    Scheduled medications:    aspirin EC  81 mg Oral Daily   enoxaparin (LOVENOX) injection  30 mg Subcutaneous Q24H   guaiFENesin  600 mg Oral BID   hydroxyurea  500 mg Oral QODAY   Ipratropium-Albuterol  1 puff Inhalation Q6H   levothyroxine  88 mcg Oral Q0600   metoprolol succinate  25 mg Oral Daily   predniSONE  40 mg Oral Q breakfast   roflumilast  500 mcg Oral Daily   rosuvastatin  40 mg Oral Daily         Data Reviewed:    CBG:  No results for input(s): GLUCAP in the last 168 hours.  SpO2: 99 % O2 Flow Rate (L/min): 4 L/min    Vitals:   03/29/21 1611 03/29/21 2155 03/30/21 0443 03/30/21 1206  BP: 131/75 113/64 104/60 99/65  Pulse: 92 79 72 76  Resp:    20  Temp: 98.5 F (36.9 C) 97.8 F (36.6 C) 97.7 F (36.5 C) 97.7 F (36.5 C)  TempSrc: Oral Oral Oral Oral  SpO2: 98% 100% 100% 99%  Weight:      Height:         Intake/Output Summary (Last 24 hours) at 03/30/2021 1528 Last data filed at 03/30/2021 1100 Gross per 24 hour  Intake 120 ml  Output --  Net 120 ml    07/28 1901 - 07/30 0700 In: 515.4 [I.V.:415.4] Out: -   Filed Weights   03/28/21 1640 03/29/21 1600  Weight: 43.5 kg 42.1 kg    CBC:  Recent Labs  Lab 03/26/21 1005 03/28/21 1750 03/29/21 0559 03/30/21 0450  WBC 4.8 10.8* 12.3* 12.0*  HGB 12.3 12.8 11.2* 10.9*  HCT 38.0 39.5 34.7* 34.0*  PLT 378 465* 378 397  MCV 91.8 92.9 93.8 93.9  MCH 29.7 30.1 30.3 30.1  MCHC 32.4 32.4 32.3 32.1  RDW 18.1* 18.6* 18.6* 18.6*  LYMPHSABS 1.7 0.8  --   --   MONOABS 0.3 0.5  --   --   EOSABS 0.0 0.0  --   --   BASOSABS 0.0 0.0  --   --     Complete metabolic panel:  Recent Labs  Lab 03/26/21 1005 03/28/21 1750 03/29/21 0559 03/30/21 0450  NA 139 138 138 139  K 4.5 4.2 4.5 4.3  CL 99 99 102 101  CO2 32 '30 27 31  '$ GLUCOSE 102* 129* 171* 118*  BUN '12 12 12 18  '$ CREATININE 0.78 0.60 0.55 0.54  CALCIUM 10.0 9.7 8.6* 9.4  AST 30 31  --   --   ALT 34 32  --   --   ALKPHOS 48 48  --   --   BILITOT 0.7 0.7  --   --   ALBUMIN 4.5 4.6  --   --   DDIMER  --  0.29  --   --     No results for input(s): LIPASE, AMYLASE in the last 168 hours.  Recent Labs  Lab 03/28/21 1750 03/28/21 1941  DDIMER 0.29  --   SARSCOV2NAA  --  POSITIVE*    ------------------------------------------------------------------------------------------------------------------ No results for input(s): CHOL, HDL, LDLCALC, TRIG, CHOLHDL,  LDLDIRECT in the last 72 hours.  No results found for: HGBA1C ------------------------------------------------------------------------------------------------------------------ No results for input(s): TSH, T4TOTAL, T3FREE, THYROIDAB in the last 72 hours.  Invalid input(s): FREET3 ------------------------------------------------------------------------------------------------------------------ No results for input(s): VITAMINB12, FOLATE, FERRITIN, TIBC, IRON, RETICCTPCT in the last 72 hours.  Coagulation profile No results for input(s): INR, PROTIME in the last 168 hours. Recent Labs    03/28/21 1750  DDIMER 0.29    Cardiac Enzymes No results for input(s): CKTOTAL, CKMB, CKMBINDEX, TROPONINI in the last 168 hours.  ------------------------------------------------------------------------------------------------------------------ No results found for: BNP   Antibiotics: Anti-infectives (From admission, onward)    Start     Dose/Rate Route Frequency Ordered Stop   03/28/21 2200  cefTRIAXone (ROCEPHIN) 1 g in sodium chloride 0.9 % 100 mL IVPB        1 g 200 mL/hr over 30 Minutes Intravenous Every 24 hours 03/28/21 2156 04/02/21 2159        Radiology Reports  DG Chest Port 1 View  Result Date: 03/28/2021 CLINICAL DATA:  sob EXAM: PORTABLE CHEST 1 VIEW COMPARISON:  September 10, 2020 FINDINGS: The cardiomediastinal silhouette is unchanged in contour.Atherosclerotic calcifications of the aorta. Emphysematous changes. No pleural effusion. No pneumothorax. No acute pleuroparenchymal abnormality. Visualized abdomen is unremarkable. No acute osseous abnormality. IMPRESSION: No acute cardiopulmonary abnormality. Electronically Signed   By: Valentino Saxon MD   On: 03/28/2021 17:55      DVT prophylaxis: Lovenox  Code Status: Full code  Family Communication: No family at bedside   Consultants:   Procedures:     Objective    Physical Examination:  General-appears in  no acute distress Heart-S1-S2, regular, no murmur auscultated Lungs-clear to auscultation bilaterally, no wheezing or crackles auscultated Abdomen-soft, nontender, no organomegaly Extremities-no edema in the lower extremities Neuro-alert, oriented x3, no focal deficit noted  Status is: Inpatient  Dispo: The patient is from: Home              Anticipated d/c is to: Home              Anticipated d/c date is: 04/03/2021              Patient currently not stable for discharge  Barrier to discharge-ongoing treatment for COPD exacerbation  COVID-19 Labs  Recent Labs    03/28/21 1750  DDIMER 0.29    Lab Results  Component Value Date   [place] (A) 03/28/2021   [place] NEGATIVE 08/21/2020    Microbiology  Recent Results (from the past  240 hour(s))  Resp Panel by RT-PCR (Flu A&B, Covid) Nasopharyngeal Swab     Status: Abnormal   Collection Time: 03/28/21  7:41 PM   Specimen: Nasopharyngeal Swab; Nasopharyngeal(NP) swabs in vial transport medium  Result Value Ref Range Status   SARS Coronavirus 2 by RT PCR POSITIVE (A) NEGATIVE Final    Comment: RESULT CALLED TO, READ BACK BY AND VERIFIED WITH: J.LOWDERMILK, RN AT 2200 ON 07.28.22 BY N.THOMPSON (NOTE) SARS-CoV-2 target nucleic acids are DETECTED.  The SARS-CoV-2 RNA is generally detectable in upper respiratory specimens during the acute phase of infection. Positive results are indicative of the presence of the identified virus, but do not rule out bacterial infection or co-infection with other pathogens not detected by the test. Clinical correlation with patient history and other diagnostic information is necessary to determine patient infection status. The expected result is Negative.  Fact Sheet for Patients: EntrepreneurPulse.com.au  Fact Sheet for Healthcare Providers: IncredibleEmployment.be  This test is not yet approved or cleared by the [place] FDA and  has  been authorized for detection and/or diagnosis of SARS-CoV-2 by FDA under an Emergency Use Authorization (EUA).  This EUA will remain in effect (meaning  this test can be used) for the duration of  the COVID-19 declaration under Section 564(b)(1) of the Act, 21 U.S.C. section 360bbb-3(b)(1), unless the authorization is terminated or revoked sooner.     Influenza A by PCR NEGATIVE NEGATIVE Final   Influenza B by PCR NEGATIVE NEGATIVE Final    Comment: (NOTE) The Xpert Xpress SARS-CoV-2/FLU/RSV plus assay is intended as an aid in the diagnosis of influenza from Nasopharyngeal swab specimens and should not be used as a sole basis for treatment. Nasal washings and aspirates are unacceptable for Xpert Xpress SARS-CoV-2/FLU/RSV testing.  Fact Sheet for Patients: EntrepreneurPulse.com.au  Fact Sheet for Healthcare Providers: IncredibleEmployment.be  This test is not yet approved or cleared by the [place] FDA and has been authorized for detection and/or diagnosis of SARS-CoV-2 by FDA under an Emergency Use Authorization (EUA). This EUA will remain in effect (meaning this test can be used) for the duration of the COVID-19 declaration under Section 564(b)(1) of the Act, 21 U.S.C. section 360bbb-3(b)(1), unless the authorization is terminated or revoked.  Performed at [facility], [place] [address] [place], [place] [address]              Oswald Hillock   [facility] If 7PM-7AM, please contact night-coverage at [email or web address], Office  [phone]   03/30/2021, 3:28 PM  LOS: 2 days

## 2021-03-31 DIAGNOSIS — J441 Chronic obstructive pulmonary disease with (acute) exacerbation: Secondary | ICD-10-CM | POA: Diagnosis not present

## 2021-03-31 LAB — BASIC METABOLIC PANEL
Anion gap: 8 (ref 5–15)
BUN: 19 mg/dL (ref 8–23)
CO2: 31 mmol/L (ref 22–32)
Calcium: 9 mg/dL (ref 8.9–10.3)
Chloride: 100 mmol/L (ref 98–111)
Creatinine, Ser: 0.72 mg/dL (ref 0.44–1.00)
GFR, Estimated: >60 mL/min (ref >60)
Glucose, Bld: 88 mg/dL (ref 70–99)
Potassium: 3.7 mmol/L (ref 3.5–5.1)
Sodium: 139 mmol/L (ref 135–145)

## 2021-03-31 LAB — CBC
HCT: 32.3 % — ABNORMAL LOW (ref 36.0–46.0)
Hemoglobin: 10.3 g/dL — ABNORMAL LOW (ref 12.0–15.0)
MCH: 30.2 pg (ref 26.0–34.0)
MCHC: 31.9 g/dL (ref 30.0–36.0)
MCV: 94.7 fL (ref 80.0–100.0)
Platelets: 345 10*3/uL (ref 150–400)
RBC: 3.41 MIL/uL — ABNORMAL LOW (ref 3.87–5.11)
RDW: 18.7 % — ABNORMAL HIGH (ref 11.5–15.5)
WBC: 5.7 10*3/uL (ref 4.0–10.5)
nRBC: 0 % (ref 0.0–0.2)

## 2021-03-31 NOTE — Progress Notes (Signed)
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  Patient was diagnosed with COVID-19 on 03/19/2021, and recovered from that.  Patient without increased cough and shortness of breath yesterday.  No chest pain or fever.  D-dimer was normal chest x-ray showed no acute cardiopulmonary disease.  Patient admitted with COPD exacerbation.  Assessment & Plan:   Principal Problem:   COPD with acute exacerbation ([place]) Active Problems:   HTN (hypertension)   Hypothyroidism   Chronic respiratory failure with hypoxia (H[facility])   Essential thrombocythemia ([place])   COVID   Hx of TIA (transient ischemic attack) and stroke  COPD exacerbation/chronic hypoxemic respiratory failure  Possible Rebound Covid after molnupiravir treatment -currently on 4 L (on 2 L at home) -notes improvement after molnupiravir, then worsened after  -At home takes prednisone 5 mg daily -Chest x-ray 7/28 without acute abnormality -Continue p.o. prednisone 40 mg daily;  Combivent inhaler 1 puff every 6 hours -Continue Mucinex 600 mg p.o. twice daily -Started on ceftriaxone -strict I/O, daily weights  COVID-19 Labs  Recent Labs    03/28/21 1750  DDIMER 0.29    Lab Results  Component Value Date   SARSCOV2NAA POSITIVE (Jovita Persing) 03/28/2021   [place] NEGATIVE 08/21/2020   Hypertension -Continue metoprolol   Hypothyroidism -Continue Synthroid   Essential thrombocythemia -Continue hydroxyurea   History of TIA/CVA -Continue aspirin, statin   Recent COVID-19 infection -She reported positive on 03/20/2021, initially resolved after molnupiravir  -Does not appear to  have COVID-19 pneumonia, chest x-ray is clear -Continue isolation for 10 days   DVT prophylaxis: lovenox Code Status: full  Family Communication: none at bedside Disposition:   Status is: Inpatient  Remains inpatient appropriate because:Inpatient level of care appropriate due to severity of illness  Dispo: The patient is from: Home              Anticipated d/c is to: Home              Patient currently is not medically stable to d/c.   Difficult to place patient No       Consultants:  none  Procedures:  none  Antimicrobials:  Anti-infectives (From admission, onward)    Start     Dose/Rate Route Frequency Ordered Stop   03/28/21 2200  cefTRIAXone (ROCEPHIN) 1 g in sodium chloride 0.9 % 100 mL IVPB        1 g 200 mL/hr over 30 Minutes Intravenous Every 24 hours 03/28/21 2156 04/02/21 2159          Subjective: No new complaints, feeling closer to normal   Objective: Vitals:   03/30/21 1206 03/30/21 2156 03/31/21 0643 03/31/21 1515  BP: 99/65 121/62 (!) 111/59 (!) 119/55  Pulse: 76 62 65 85  Resp: '20 20 20 '$ (!) 22  Temp: 97.7 F (36.5 C) 98.2 F (36.8 C) 98.9 F (37.2 C) 98.4 F (36.9 C)  TempSrc: Oral Oral Oral Oral  SpO2: 99% 100% 100% 98%  Weight:      Height:       No intake or output data in the 24 hours ending 03/31/21 1630 Filed Weights   03/28/21 1640 03/29/21 1600  Weight:  43.5 kg 42.1 kg    Examination:  General exam: Appears calm and comfortable  Respiratory system: Clear to auscultation. Respiratory effort normal. Cardiovascular system: S1 & S2 heard, RRR. Gastrointestinal system: Abdomen is nondistended, soft and nontender. N Central nervous system: Alert and oriented. No focal neurological deficits. Extremities: no LEE Skin: No rashes, lesions or ulcers Psychiatry: Judgement and insight appear normal. Mood & affect appropriate.     Data Reviewed: I have personally reviewed following labs and imaging studies  CBC: Recent Labs   Lab 03/26/21 1005 03/28/21 1750 03/29/21 0559 03/30/21 0450 03/31/21 0428  WBC 4.8 10.8* 12.3* 12.0* 5.7  NEUTROABS 2.7 9.4*  --   --   --   HGB 12.3 12.8 11.2* 10.9* 10.3*  HCT 38.0 39.5 34.7* 34.0* 32.3*  MCV 91.8 92.9 93.8 93.9 94.7  PLT 378 465* 378 397 [id number]    Basic Metabolic Panel: Recent Labs  Lab 03/26/21 1005 03/28/21 1750 03/29/21 0559 03/30/21 0450 03/31/21 0428  NA 139 138 138 139 139  K 4.5 4.2 4.5 4.3 3.7  CL 99 99 102 101 100  CO2 32 '30 27 31 31  '$ GLUCOSE 102* 129* 171* 118* 88  BUN '12 12 12 18 19  '$ CREATININE 0.78 0.60 0.55 0.54 0.72  CALCIUM 10.0 9.7 8.6* 9.4 9.0    GFR: Estimated Creatinine Clearance: 46 mL/min (by C-G formula based on SCr of 0.72 mg/dL).  Liver Function Tests: Recent Labs  Lab 03/26/21 1005 03/28/21 1750  AST 30 31  ALT 34 32  ALKPHOS 48 48  BILITOT 0.7 0.7  PROT 7.2 7.7  ALBUMIN 4.5 4.6    CBG: No results for input(s): GLUCAP in the last 168 hours.   Recent Results (from the past 240 hour(s))  Resp Panel by RT-PCR (Flu Jaxen Samples&B, Covid) Nasopharyngeal Swab     Status: Abnormal   Collection Time: 03/28/21  7:41 PM   Specimen: Nasopharyngeal Swab; Nasopharyngeal(NP) swabs in vial transport medium  Result Value Ref Range Status   SARS Coronavirus 2 by RT PCR POSITIVE (Mauro Arps) NEGATIVE Final    Comment: RESULT CALLED TO, READ BACK BY AND VERIFIED WITH: J.LOWDERMILK, RN AT 2200 ON 07.28.22 BY N.THOMPSON (NOTE) SARS-CoV-2 target nucleic acids are DETECTED.  The SARS-CoV-2 RNA is generally detectable in upper respiratory specimens during the acute phase of infection. Positive results are indicative of the presence of the identified virus, but do not rule out bacterial infection or co-infection with other pathogens not detected by the test. Clinical correlation with patient history and other diagnostic information is necessary to determine patient infection status. The expected result is Negative.  Fact Sheet for  Patients: EntrepreneurPulse.com.au  Fact Sheet for Healthcare Providers: IncredibleEmployment.be  This test is not yet approved or cleared by the [place] FDA and  has been authorized for detection and/or diagnosis of SARS-CoV-2 by FDA under an Emergency Use Authorization (EUA).  This EUA will remain in effect (meaning  this test can be used) for the duration of  the COVID-19 declaration under Section 564(b)(1) of the Act, 21 U.S.C. section 360bbb-3(b)(1), unless the authorization is terminated or revoked sooner.     Influenza Kourtney Montesinos by PCR NEGATIVE NEGATIVE Final   Influenza B by PCR NEGATIVE NEGATIVE Final    Comment: (NOTE) The Xpert Xpress SARS-CoV-2/FLU/RSV plus assay is intended as an aid in the diagnosis of influenza from Nasopharyngeal swab specimens and should not be used as Denard Tuminello sole basis for treatment. Nasal washings and aspirates are unacceptable for Xpert  Xpress SARS-CoV-2/FLU/RSV testing.  Fact Sheet for Patients: EntrepreneurPulse.com.au  Fact Sheet for Healthcare Providers: IncredibleEmployment.be  This test is not yet approved or cleared by the [place] FDA and has been authorized for detection and/or diagnosis of SARS-CoV-2 by FDA under an Emergency Use Authorization (EUA). This EUA will remain in effect (meaning this test can be used) for the duration of the COVID-19 declaration under Section 564(b)(1) of the Act, 21 U.S.C. section 360bbb-3(b)(1), unless the authorization is terminated or revoked.  Performed at [facility], [place] [address], [place], [place] [address]          Radiology Studies: No results found.      Scheduled Meds:  aspirin EC  81 mg Oral Daily   enoxaparin (LOVENOX) injection  30 mg Subcutaneous Q24H   guaiFENesin  600 mg Oral BID   hydroxyurea  500 mg Oral QODAY   Ipratropium-Albuterol  1 puff Inhalation Q6H   levothyroxine  88  mcg Oral Q0600   metoprolol succinate  25 mg Oral Daily   predniSONE  40 mg Oral Q breakfast   roflumilast  500 mcg Oral Daily   rosuvastatin  40 mg Oral Daily   Continuous Infusions:  cefTRIAXone (ROCEPHIN)  IV 1 g (03/30/21 2215)     LOS: 3 days    Time spent: over 30 min    Fayrene Helper, MD [facility]   To contact the attending provider between 7A-7P or the covering provider during after hours 7P-7A, please log into the web site [email or web address] and access using universal [place] password for that web site. If you do not have the password, please call the hospital operator.  03/31/2021, 4:30 PM

## 2021-03-31 NOTE — Plan of Care (Signed)
  Problem: Education: Goal: Knowledge of risk factors and measures for prevention of condition will improve Outcome: Progressing   Problem: Coping: Goal: Psychosocial and spiritual needs will be supported Outcome: Progressing   Problem: Respiratory: Goal: Will maintain a patent airway Outcome: Progressing Goal: Complications related to the disease process, condition or treatment will be avoided or minimized Outcome: Progressing   Problem: Education: Goal: Knowledge of General Education information will improve Description: Including pain rating scale, medication(s)/side effects and non-pharmacologic comfort measures Outcome: Progressing   Problem: Clinical Measurements: Goal: Will remain free from infection Outcome: Progressing Goal: Respiratory complications will improve Outcome: Progressing

## 2021-04-01 DIAGNOSIS — J441 Chronic obstructive pulmonary disease with (acute) exacerbation: Secondary | ICD-10-CM | POA: Diagnosis not present

## 2021-04-01 LAB — COMPREHENSIVE METABOLIC PANEL
ALT: 33 U/L (ref 0–44)
AST: 31 U/L (ref 15–41)
Albumin: 3.5 g/dL (ref 3.5–5.0)
Alkaline Phosphatase: 35 U/L — ABNORMAL LOW (ref 38–126)
Anion gap: 8 (ref 5–15)
BUN: 16 mg/dL (ref 8–23)
CO2: 32 mmol/L (ref 22–32)
Calcium: 9.2 mg/dL (ref 8.9–10.3)
Chloride: 98 mmol/L (ref 98–111)
Creatinine, Ser: 0.52 mg/dL (ref 0.44–1.00)
GFR, Estimated: >60 mL/min (ref >60)
Glucose, Bld: 85 mg/dL (ref 70–99)
Potassium: 3.7 mmol/L (ref 3.5–5.1)
Sodium: 138 mmol/L (ref 135–145)
Total Bilirubin: 0.6 mg/dL (ref 0.3–1.2)
Total Protein: 6.3 g/dL — ABNORMAL LOW (ref 6.5–8.1)

## 2021-04-01 LAB — CBC WITH DIFFERENTIAL/PLATELET
Abs Immature Granulocytes: 0.02 10*3/uL (ref 0.00–0.07)
Basophils Absolute: 0 10*3/uL (ref 0.0–0.1)
Basophils Relative: 0 %
Eosinophils Absolute: 0 10*3/uL (ref 0.0–0.5)
Eosinophils Relative: 0 %
HCT: 34 % — ABNORMAL LOW (ref 36.0–46.0)
Hemoglobin: 10.9 g/dL — ABNORMAL LOW (ref 12.0–15.0)
Immature Granulocytes: 1 %
Lymphocytes Relative: 24 %
Lymphs Abs: 1.1 10*3/uL (ref 0.7–4.0)
MCH: 30 pg (ref 26.0–34.0)
MCHC: 32.1 g/dL (ref 30.0–36.0)
MCV: 93.7 fL (ref 80.0–100.0)
Monocytes Absolute: 0.4 10*3/uL (ref 0.1–1.0)
Monocytes Relative: 9 %
Neutro Abs: 2.9 10*3/uL (ref 1.7–7.7)
Neutrophils Relative %: 66 %
Platelets: 343 10*3/uL (ref 150–400)
RBC: 3.63 MIL/uL — ABNORMAL LOW (ref 3.87–5.11)
RDW: 17.9 % — ABNORMAL HIGH (ref 11.5–15.5)
WBC: 4.4 10*3/uL (ref 4.0–10.5)
nRBC: 0 % (ref 0.0–0.2)

## 2021-04-01 LAB — C-REACTIVE PROTEIN: CRP: 2.2 mg/dL — ABNORMAL HIGH (ref <1.0)

## 2021-04-01 LAB — PHOSPHORUS: Phosphorus: 2.6 mg/dL (ref 2.5–4.6)

## 2021-04-01 LAB — D-DIMER, QUANTITATIVE: D-Dimer, Quant: 0.36 ug/mL-FEU (ref 0.00–0.50)

## 2021-04-01 LAB — MAGNESIUM: Magnesium: 2 mg/dL (ref 1.7–2.4)

## 2021-04-01 MED ORDER — PREDNISONE 20 MG PO TABS: ORAL_TABLET | ORAL | 0 refills | Status: AC

## 2021-04-01 MED ORDER — AMOXICILLIN 500 MG PO CAPS: 1000.0000 mg | ORAL_CAPSULE | Freq: Three times a day (TID) | ORAL | 0 refills | Status: AC

## 2021-04-01 NOTE — Discharge Summary (Signed)
Physician Discharge Summary  Cheryl Haley [id number] DOB: 10/20/1955 DOA: 03/28/2021  PCP: [name], MD  Admit date: 03/28/2021 Discharge date: 04/01/2021  Time spent: 40 minutes  Recommendations for Outpatient Follow-up:  Follow outpatient CBC/CMP Follow isolation guidelines per CDC Follow with pulm outpatient     Discharge Diagnoses:  Principal Problem:   COPD with acute exacerbation ([place]) Active Problems:   HTN (hypertension)   Hypothyroidism   Chronic respiratory failure with hypoxia ([place])   Essential thrombocythemia ([place])   COVID   Hx of TIA (transient ischemic attack) and stroke   Discharge Condition: stable  Diet recommendation: heart healthy  Filed Weights   03/28/21 1640 03/29/21 1600  Weight: 43.5 kg 42.1 kg    History of present illness:  65 year old female with medical history of COPD on chronic 2 L/min of oxygen, essential thrombocytosis, hypertension, hypothyroidism, history of CVA and TIA presented to ED for evaluation of cough and shortness of breath.  Patient was diagnosed with COVID-19 on 03/19/2021, and recovered from that.  Patient without increased cough and shortness of breath yesterday.  No chest pain or fever.  D-dimer was normal chest x-ray showed no acute cardiopulmonary disease.  Patient admitted with COPD exacerbation.  She was admitted for COPD exacerbation.  She'd also recently been treated with molnupiravir and noted her symptoms got worse 2-3 days after completing this, so rebound possible as well.  Treated with steroids and antibiotics.  Improved on day of discharge 8/1.  See below for additional details  Hospital Course:  COPD exacerbation/chronic hypoxemic respiratory failure  Possible Rebound Covid after molnupiravir treatment -back on home 2 L today, plan for d/c home -notes improvement after molnupiravir, then worsened after  -At home takes prednisone 5 mg daily - taper steroids to home dose -Chest x-ray 7/28 without acute  abnormality -Continue p.o. prednisone 40 mg daily;  Combivent inhaler 1 puff every 6 hours -Continue Mucinex 600 mg p.o. twice daily -ceftriaxone, will d/c with 1 day amox -strict I/O, daily weights  COVID-19 Labs  Recent Labs    04/01/21 0331  DDIMER 0.36  CRP 2.2*    Lab Results  Component Value Date   SARSCOV2NAA POSITIVE (Cheryl Haley) 03/28/2021   [place] NEGATIVE 08/21/2020   Hypertension -Continue metoprolol   Hypothyroidism -Continue Synthroid   Essential thrombocythemia -Continue hydroxyurea   History of TIA/CVA -Continue aspirin, statin   Recent COVID-19 infection  Suspected Rebound -She reported positive on 03/20/2021, initially resolved after molnupiravir  -symptoms worsened after she completed antiviral -will recommend she isolate again per guidelines (5 days isolation from positive test, then mask wearing additional 5 days)  Procedures: none (i.e. Studies not automatically included, echos, thoracentesis, etc; not x-rays)  Consultations: none  Discharge Exam: Vitals:   04/01/21 1200 04/01/21 1256  BP:  130/66  Pulse:  73  Resp:  18  Temp:  98.4 F (36.9 C)  SpO2: 98% 98%   Feeling better, eager to discharge home  General: No acute distress. Cardiovascular: Heart sounds show Donterrius Santucci regular rate, and rhythm. Lungs: Clear to auscultation bilaterally Abdomen: Soft, nontender, nondistended  Neurological: Alert and oriented 3. Moves all extremities 4 . Cranial nerves II through XII grossly intact. Skin: Warm and dry. No rashes or lesions. Extremities: No clubbing or cyanosis. No edema.  Discharge Instructions   Discharge Instructions     Call MD for:  difficulty breathing, headache or visual disturbances   Complete by: As directed    Call MD for:  extreme fatigue   Complete  by: As directed    Call MD for:  hives   Complete by: As directed    Call MD for:  persistant dizziness or light-headedness   Complete by: As directed    Call MD for:   persistant nausea and vomiting   Complete by: As directed    Call MD for:  redness, tenderness, or signs of infection (pain, swelling, redness, odor or green/yellow discharge around incision site)   Complete by: As directed    Call MD for:  severe uncontrolled pain   Complete by: As directed    Call MD for:  temperature >100.4   Complete by: As directed    Diet - low sodium heart healthy   Complete by: As directed    Discharge instructions   Complete by: As directed    You were seen for Cheryl Haley COPD exacerbation and possible COVID rebound after molnupiravir.    You've been treated with antibiotics and steroids.  We'll send you home with Cheryl Haley short steroid taper and 1 more day of antibiotics.  Continue to isolate for an additional day (until after 8/2), then you'll need to ensure you wear Cheryl Haley mask for the next 5 days (until after 8/7).  Return for new, recurrent, or worsening symptoms.  Please ask your PCP to request records from this hospitalization so they know what was done and what the next steps will be.   Increase activity slowly   Complete by: As directed       Allergies as of 04/01/2021   No Known Allergies      Medication List     TAKE these medications    albuterol 108 (90 Base) MCG/ACT inhaler Commonly known as: VENTOLIN HFA Inhale 2 puffs into the lungs every 6 (six) hours as needed for wheezing or shortness of breath.   amoxicillin 500 MG capsule Commonly known as: AMOXIL Take 2 capsules (1,000 mg total) by mouth 3 (three) times daily for 1 day.   aspirin EC 81 MG tablet Take 81 mg daily by mouth.   Breztri Aerosphere 160-9-4.8 MCG/ACT Aero Generic drug: Budeson-Glycopyrrol-Formoterol INHALE 2 PUFFS IN THE MORNING AND AT BEDTIME   feeding supplement Liqd Take 237 mLs by mouth 2 (two) times daily between meals.   hydroxyurea 500 MG capsule Commonly known as: HYDREA TAKE 1 CAPSULE BY MOUTH EVERY OTHER DAY *MAY  TAKE  WITH  FOOD  TO  MINIMZE  GI  SIDE  EFFECTS*    levothyroxine 88 MCG tablet Commonly known as: SYNTHROID Take 88 mcg by mouth daily before breakfast.   metoprolol succinate 25 MG 24 hr tablet Commonly known as: TOPROL-XL Take 25 mg by mouth daily.   OXYGEN Inhale 2 L into the lungs continuous.   predniSONE 5 MG tablet Commonly known as: DELTASONE Take 1 tablet (5 mg total) by mouth daily with breakfast. What changed: Another medication with the same name was added. Make sure you understand how and when to take each.   predniSONE 20 MG tablet Commonly known as: DELTASONE Take 1.5 tablets (30 mg total) by mouth daily with breakfast for 1 day, THEN 1 tablet (20 mg total) daily with breakfast for 1 day, THEN 0.5 tablets (10 mg total) daily with breakfast for 1 day. Then resume home dose of 5 mg daily.. Start taking on: April 02, 2021 What changed: You were already taking Cheryl Haley medication with the same name, and this prescription was added. Make sure you understand how and when to take each.  roflumilast 500 MCG Tabs tablet Commonly known as: DALIRESP Take 1 tablet (500 mcg total) by mouth daily.   rosuvastatin 40 MG tablet Commonly known as: CRESTOR Take 1 tablet (40 mg total) by mouth daily.       No Known Allergies    The results of significant diagnostics from this hospitalization (including imaging, microbiology, ancillary and laboratory) are listed below for reference.    Significant Diagnostic Studies: DG Chest Port 1 View  Result Date: 03/28/2021 CLINICAL DATA:  sob EXAM: PORTABLE CHEST 1 VIEW COMPARISON:  September 10, 2020 FINDINGS: The cardiomediastinal silhouette is unchanged in contour.Atherosclerotic calcifications of the aorta. Emphysematous changes. No pleural effusion. No pneumothorax. No acute pleuroparenchymal abnormality. Visualized abdomen is unremarkable. No acute osseous abnormality. IMPRESSION: No acute cardiopulmonary abnormality. Electronically Signed   By: Valentino Saxon MD   On: 03/28/2021 17:55     Microbiology: Recent Results (from the past 240 hour(s))  Resp Panel by RT-PCR (Flu Climmie Buelow&B, Covid) Nasopharyngeal Swab     Status: Abnormal   Collection Time: 03/28/21  7:41 PM   Specimen: Nasopharyngeal Swab; Nasopharyngeal(NP) swabs in vial transport medium  Result Value Ref Range Status   SARS Coronavirus 2 by RT PCR POSITIVE (Cheryl Haley) NEGATIVE Final    Comment: RESULT CALLED TO, READ BACK BY AND VERIFIED WITH: J.LOWDERMILK, RN AT 2200 ON 07.28.22 BY N.THOMPSON (NOTE) SARS-CoV-2 target nucleic acids are DETECTED.  The SARS-CoV-2 RNA is generally detectable in upper respiratory specimens during the acute phase of infection. Positive results are indicative of the presence of the identified virus, but do not rule out bacterial infection or co-infection with other pathogens not detected by the test. Clinical correlation with patient history and other diagnostic information is necessary to determine patient infection status. The expected result is Negative.  Fact Sheet for Patients: EntrepreneurPulse.com.au  Fact Sheet for Healthcare Providers: IncredibleEmployment.be  This test is not yet approved or cleared by the [place] FDA and  has been authorized for detection and/or diagnosis of SARS-CoV-2 by FDA under an Emergency Use Authorization (EUA).  This EUA will remain in effect (meaning  this test can be used) for the duration of  the COVID-19 declaration under Section 564(b)(1) of the Act, 21 U.S.C. section 360bbb-3(b)(1), unless the authorization is terminated or revoked sooner.     Influenza Cheryl Haley by PCR NEGATIVE NEGATIVE Final   Influenza B by PCR NEGATIVE NEGATIVE Final    Comment: (NOTE) The Xpert Xpress SARS-CoV-2/FLU/RSV plus assay is intended as an aid in the diagnosis of influenza from Nasopharyngeal swab specimens and should not be used as Teyah Rossy sole basis for treatment. Nasal washings and aspirates are unacceptable for Xpert Xpress  SARS-CoV-2/FLU/RSV testing.  Fact Sheet for Patients: EntrepreneurPulse.com.au  Fact Sheet for Healthcare Providers: IncredibleEmployment.be  This test is not yet approved or cleared by the [place] FDA and has been authorized for detection and/or diagnosis of SARS-CoV-2 by FDA under an Emergency Use Authorization (EUA). This EUA will remain in effect (meaning this test can be used) for the duration of the COVID-19 declaration under Section 564(b)(1) of the Act, 21 U.S.C. section 360bbb-3(b)(1), unless the authorization is terminated or revoked.  Performed at [facility], [place] [address] [place], [place] [address]      Labs: Basic Metabolic Panel: Recent Labs  Lab 03/28/21 1750 03/29/21 0559 03/30/21 0450 03/31/21 0428 04/01/21 0331  NA 138 138 139 139 138  K 4.2 4.5 4.3 3.7 3.7  CL 99 102 101 100 98  CO2 '30 27 31 31 '$ 32  GLUCOSE 129* 171* 118* 88 85  BUN '12 12 18 19 16  '$ CREATININE 0.60 0.55 0.54 0.72 0.52  CALCIUM 9.7 8.6* 9.4 9.0 9.2  MG  --   --   --   --  2.0  PHOS  --   --   --   --  2.6   Liver Function Tests: Recent Labs  Lab 03/26/21 1005 03/28/21 1750 04/01/21 0331  AST '30 31 31  '$ ALT 34 32 33  ALKPHOS 48 48 35*  BILITOT 0.7 0.7 0.6  PROT 7.2 7.7 6.3*  ALBUMIN 4.5 4.6 3.5   No results for input(s): LIPASE, AMYLASE in the last 168 hours. No results for input(s): AMMONIA in the last 168 hours. CBC: Recent Labs  Lab 03/26/21 1005 03/28/21 1750 03/29/21 0559 03/30/21 0450 03/31/21 0428 04/01/21 0331  WBC 4.8 10.8* 12.3* 12.0* 5.7 4.4  NEUTROABS 2.7 9.4*  --   --   --  2.9  HGB 12.3 12.8 11.2* 10.9* 10.3* 10.9*  HCT 38.0 39.5 34.7* 34.0* 32.3* 34.0*  MCV 91.8 92.9 93.8 93.9 94.7 93.7  PLT 378 465* 378 397 345 343   Cardiac Enzymes: No results for input(s): CKTOTAL, CKMB, CKMBINDEX, TROPONINI in the last 168 hours. BNP: BNP (last 3 results) No results for input(s): BNP in the last  8760 hours.  ProBNP (last 3 results) No results for input(s): PROBNP in the last 8760 hours.  CBG: No results for input(s): GLUCAP in the last 168 hours.     Signed:  Fayrene Helper MD.  [facility] 04/01/2021, 1:51 PM

## 2021-04-22 ENCOUNTER — Other Ambulatory Visit: Payer: Self-pay | Admitting: Hematology & Oncology

## 2021-04-23 ENCOUNTER — Other Ambulatory Visit: Payer: Self-pay | Admitting: Family Medicine

## 2021-04-23 DIAGNOSIS — Z13820 Encounter for screening for osteoporosis: Secondary | ICD-10-CM

## 2021-04-30 ENCOUNTER — Emergency Department (HOSPITAL_BASED_OUTPATIENT_CLINIC_OR_DEPARTMENT_OTHER): Payer: Medicare Other

## 2021-04-30 ENCOUNTER — Encounter (HOSPITAL_BASED_OUTPATIENT_CLINIC_OR_DEPARTMENT_OTHER): Payer: Self-pay

## 2021-04-30 ENCOUNTER — Other Ambulatory Visit: Payer: Self-pay

## 2021-04-30 ENCOUNTER — Emergency Department (HOSPITAL_BASED_OUTPATIENT_CLINIC_OR_DEPARTMENT_OTHER)
Admission: EM | Admit: 2021-04-30 | Discharge: 2021-04-30 | Disposition: A | Discharge status: Home / Self Care | Payer: Medicare Other | Attending: Emergency Medicine | Admitting: Emergency Medicine

## 2021-04-30 DIAGNOSIS — I10 Essential (primary) hypertension: Secondary | ICD-10-CM | POA: Diagnosis not present

## 2021-04-30 DIAGNOSIS — J181 Lobar pneumonia, unspecified organism: Secondary | ICD-10-CM | POA: Diagnosis not present

## 2021-04-30 DIAGNOSIS — J441 Chronic obstructive pulmonary disease with (acute) exacerbation: Secondary | ICD-10-CM | POA: Diagnosis not present

## 2021-04-30 DIAGNOSIS — J189 Pneumonia, unspecified organism: Secondary | ICD-10-CM

## 2021-04-30 DIAGNOSIS — Z79899 Other long term (current) drug therapy: Secondary | ICD-10-CM | POA: Diagnosis not present

## 2021-04-30 DIAGNOSIS — Z7951 Long term (current) use of inhaled steroids: Secondary | ICD-10-CM | POA: Insufficient documentation

## 2021-04-30 DIAGNOSIS — Z87891 Personal history of nicotine dependence: Secondary | ICD-10-CM | POA: Insufficient documentation

## 2021-04-30 DIAGNOSIS — E039 Hypothyroidism, unspecified: Secondary | ICD-10-CM | POA: Diagnosis not present

## 2021-04-30 DIAGNOSIS — Z7982 Long term (current) use of aspirin: Secondary | ICD-10-CM | POA: Diagnosis not present

## 2021-04-30 DIAGNOSIS — Z8616 Personal history of COVID-19: Secondary | ICD-10-CM | POA: Insufficient documentation

## 2021-04-30 DIAGNOSIS — R911 Solitary pulmonary nodule: Secondary | ICD-10-CM | POA: Diagnosis not present

## 2021-04-30 DIAGNOSIS — R0789 Other chest pain: Secondary | ICD-10-CM | POA: Diagnosis present

## 2021-04-30 LAB — COMPREHENSIVE METABOLIC PANEL
ALT: 31 U/L (ref 0–44)
AST: 38 U/L (ref 15–41)
Albumin: 3.6 g/dL (ref 3.5–5.0)
Alkaline Phosphatase: 63 U/L (ref 38–126)
Anion gap: 11 (ref 5–15)
BUN: 16 mg/dL (ref 8–23)
CO2: 26 mmol/L (ref 22–32)
Calcium: 9.1 mg/dL (ref 8.9–10.3)
Chloride: 98 mmol/L (ref 98–111)
Creatinine, Ser: 0.61 mg/dL (ref 0.44–1.00)
GFR, Estimated: >60 mL/min (ref >60)
Glucose, Bld: 146 mg/dL — ABNORMAL HIGH (ref 70–99)
Potassium: 4.1 mmol/L (ref 3.5–5.1)
Sodium: 135 mmol/L (ref 135–145)
Total Bilirubin: 0.4 mg/dL (ref 0.3–1.2)
Total Protein: 7.3 g/dL (ref 6.5–8.1)

## 2021-04-30 LAB — CBC WITH DIFFERENTIAL/PLATELET
Abs Immature Granulocytes: 0.04 10*3/uL (ref 0.00–0.07)
Basophils Absolute: 0 10*3/uL (ref 0.0–0.1)
Basophils Relative: 0 %
Eosinophils Absolute: 0 10*3/uL (ref 0.0–0.5)
Eosinophils Relative: 0 %
HCT: 36.4 % (ref 36.0–46.0)
Hemoglobin: 12 g/dL (ref 12.0–15.0)
Immature Granulocytes: 1 %
Lymphocytes Relative: 7 %
Lymphs Abs: 0.5 10*3/uL — ABNORMAL LOW (ref 0.7–4.0)
MCH: 31.3 pg (ref 26.0–34.0)
MCHC: 33 g/dL (ref 30.0–36.0)
MCV: 95 fL (ref 80.0–100.0)
Monocytes Absolute: 0.5 10*3/uL (ref 0.1–1.0)
Monocytes Relative: 6 %
Neutro Abs: 6.5 10*3/uL (ref 1.7–7.7)
Neutrophils Relative %: 86 %
Platelets: 408 10*3/uL — ABNORMAL HIGH (ref 150–400)
RBC: 3.83 MIL/uL — ABNORMAL LOW (ref 3.87–5.11)
RDW: 17.3 % — ABNORMAL HIGH (ref 11.5–15.5)
WBC: 7.6 10*3/uL (ref 4.0–10.5)
nRBC: 0 % (ref 0.0–0.2)

## 2021-04-30 LAB — D-DIMER, QUANTITATIVE: D-Dimer, Quant: 2.12 ug/mL-FEU — ABNORMAL HIGH (ref 0.00–0.50)

## 2021-04-30 MED ORDER — IPRATROPIUM-ALBUTEROL 0.5-2.5 (3) MG/3ML IN SOLN
3.0000 mL | Freq: Once | RESPIRATORY_TRACT | Status: AC
  Administered 2021-04-30: 3 mL via RESPIRATORY_TRACT
  Filled 2021-04-30: qty 3

## 2021-04-30 MED ORDER — AMOXICILLIN-POT CLAVULANATE 875-125 MG PO TABS: 1.0000 | ORAL_TABLET | Freq: Two times a day (BID) | ORAL | 0 refills | Status: DC

## 2021-04-30 MED ORDER — DOXYCYCLINE HYCLATE 100 MG PO CAPS: 100.0000 mg | ORAL_CAPSULE | Freq: Two times a day (BID) | ORAL | 0 refills | Status: AC

## 2021-04-30 MED ORDER — IOHEXOL 350 MG/ML SOLN
85.0000 mL | Freq: Once | INTRAVENOUS | Status: AC | PRN
  Administered 2021-04-30: 85 mL via INTRAVENOUS

## 2021-04-30 NOTE — Discharge Instructions (Addendum)
Take antibiotics as prescribed.  Take entire course, even if symptoms improve. Continue taking home medications as prescribed. Your CT today showed new/changed lung nodules.  It is important that you follow-up with either your oncologist or your lung doctor for further evaluation of this. Return to the emergency room with any new, worsening, concerning symptoms

## 2021-04-30 NOTE — ED Notes (Signed)
Patient transported to CT 

## 2021-04-30 NOTE — ED Notes (Signed)
Spoke with Shay in lab to add on CBC, CMP

## 2021-04-30 NOTE — ED Provider Notes (Signed)
[facility] Provider Note   CSN: [id number] Arrival date & time: 04/30/21  1251     History Chief Complaint  Patient presents with   Chest Pain    Cheryl Haley is a 65 y.o. female with history of COPD normally on 2 L who presents the emergency department today for chest wall pain that began 4 days ago.  She is complaining of left chest wall pain that is worse with movement and deep respirations.  It is intermittent in nature.  She rates it moderate in severity.  She was seen and evaluated by her primary care provider yesterday who empirically treated her for pneumonia with amoxicillin.  Her pain was worse today which prompted her arrival to the emergency department.  She normally has shortness of breath at baseline but has not required any additional O2 requirements and has any worsening shortness of breath.  She denies fever, chills, abdominal pain, nausea, vomiting, diarrhea, urinary complaints, sore throat, rash, and history of shingles.   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     Pneumonia    Protein-calorie malnutrition, severe 09/17/2018   Pulmonary nodules 07/14/2018   07/2017 CT chest >pulmonary nodules noted.  07/2017 PET scan -Hypermetabolic LUL nodule  [id number] CT chest >decreased nodule size, new pulmonary nodules noted.  FOB -atypical cells  CT chest 07/2018 >stable nodules to smaller on established nodules . 2 new nodules RUL , RLL >CT chest 3 months    Thyroid disease    Tobacco abuse  11/28/2015   Smoked 2 packs of cigarettes daily from teenage years until age 36, quit in 2016    Unspecified hypothyroidism 09/20/2013   Viral respiratory infection 09/16/2018    Patient Active Problem List   Diagnosis Date Noted   COVID 03/28/2021   Hx of TIA (transient ischemic attack) and stroke    Oral thrush 09/10/2020   Medication management 09/10/2020   Abnormal findings on diagnostic imaging of lung 09/10/2020   Healthcare maintenance 09/10/2020   Acute on chronic respiratory failure with hypoxia and hypercapnia (HCC)    Hyponatremia 08/21/2020   Hypokalemia 08/21/2020   Sepsis without septic shock ([place]) 08/21/2020   SIRS (systemic inflammatory response syndrome) ([place])    Essential thrombocythemia ([place]) 09/15/2019   Protein-calorie malnutrition, severe 09/17/2018   Viral respiratory infection 09/16/2018   COPD exacerbation ([place]) 09/15/2018   Pulmonary nodules 07/14/2018   Pulmonary nodule, left    Chronic respiratory failure with hypoxia ([place]) 06/10/2016   Tobacco abuse 11/28/2015   Pleural effusion 05/15/2014   COPD (chronic obstructive pulmonary disease) with emphysema ([place]) 09/20/2013   COPD with acute exacerbation ([place]) 09/20/2013   HTN (hypertension) 09/20/2013   Hypothyroidism 09/20/2013    Past Surgical History:  Procedure Laterality Date   ESOPHAGOGASTRODUODENOSCOPY     VIDEO BRONCHOSCOPY WITH ENDOBRONCHIAL NAVIGATION Left 07/29/2017   Procedure: VIDEO BRONCHOSCOPY WITH ENDOBRONCHIAL NAVIGATION;  Surgeon: Collene Gobble, MD;  Location: [facility] OR;  Service: Thoracic;  Laterality: Left;     OB History   No obstetric history on file.     Family History  Problem Relation Age of Onset   Emphysema Father    Heart disease Father    Emphysema Brother    Emphysema Sister    Emphysema Sister    Heart disease Mother    Heart disease Sister    Heart disease Brother    Heart disease Brother    Congestive Heart Failure Sister    Congestive Heart Failure Sister     Cancer Sister        lung   Cancer Sister        breast    Social History   Tobacco Use   Smoking status: Former    Packs/day: 2.00    Years: 40.00    Pack years: 80.00    Types: Cigarettes    Quit date: 03/16/2017    Years since quitting: 4.1   Smokeless tobacco: Never   Tobacco comments:       Vaping Use   Vaping Use: Never used  Substance Use Topics   Alcohol use: Not Currently   Drug use: No    Home Medications Prior to Admission medications   Medication Sig Start Date End Date Taking? Authorizing Provider  albuterol (VENTOLIN HFA) 108 (90 Base) MCG/ACT inhaler Inhale 2 puffs into the lungs every 6 (six) hours as needed for wheezing or shortness of breath. 09/14/20   Lauraine Rinne, NP  aspirin EC 81 MG tablet Take 81 mg daily by mouth.     [provider]  BREZTRI AEROSPHERE 160-9-4.8 MCG/ACT AERO INHALE 2 PUFFS IN THE MORNING AND AT BEDTIME 01/07/21   Brand Males, MD  feeding supplement (ENSURE ENLIVE / ENSURE PLUS) LIQD Take 237 mLs by mouth 2 (two) times daily between meals. 08/28/20   Debbe Odea, MD  hydroxyurea (HYDREA) 500 MG capsule TAKE 1 CAPSULE BY MOUTH EVERY OTHER DAY (MAY  TAKE  WITH  FOOD  TO  MINIMIZE  GI  SIDE  EFFECTS) 04/22/21   Volanda Napoleon, MD  levothyroxine (SYNTHROID) 88 MCG tablet Take 88 mcg by mouth daily before breakfast. 11/04/19   [provider]  metoprolol succinate (TOPROL-XL) 25 MG 24 hr tablet Take 25 mg by mouth daily. 11/10/18   [provider]  OXYGEN Inhale 2 L into the lungs continuous.    [provider]  predniSONE (DELTASONE) 5 MG tablet Take 1 tablet (5 mg total) by mouth daily with breakfast. 03/05/21   Brand Males, MD  roflumilast (DALIRESP) 500 MCG TABS tablet Take 1 tablet (500 mcg total) by mouth daily. 11/19/20   Brand Males, MD  rosuvastatin (CRESTOR) 40 MG tablet Take 1 tablet (40 mg total) by mouth daily. 07/13/20   Sueanne Margarita, MD    Allergies    Patient has no  known allergies.  Review of Systems   Review of Systems  Cardiovascular:  Positive for chest pain.  Gastrointestinal:  Negative for diarrhea, nausea and vomiting.  Genitourinary:  Positive for hematuria. Negative for dysuria.  All other systems reviewed and are negative.  Physical Exam Updated Vital Signs BP (!) 144/80 (BP Location: Right Arm)   Pulse 87   Temp 98 F (36.7 C) (Oral)   Resp 20   Ht '5\' 4"'$  (1.626 m)   Wt 41.7 kg   SpO2 97%   BMI 15.79 kg/m   Physical Exam Constitutional:  General: She is not in acute distress.    Appearance: Normal appearance.  HENT:     Head: Normocephalic and atraumatic.  Eyes:     General:        Right eye: No discharge.        Left eye: No discharge.  Cardiovascular:     Comments: Distant heart sounds. Normal rhythm.  S1/S2 are distinct without any evidence of murmur, rubs, or gallops.  Radial pulses are 2+ bilaterally.  Dorsalis pedis pulses are 2+ bilaterally.  No evidence of pedal edema. Pulmonary:     Comments: Clear to auscultation bilaterally.  Normal effort.  No respiratory distress.  No evidence of wheezes, rales, or rhonchi heard throughout. Chest:     Comments: There does not appear to be any obvious rash over the left lateral chest wall.  There is mild tenderness to palpation of the left lateral chest wall.  Abdominal:     General: Abdomen is flat. Bowel sounds are normal. There is no distension.     Tenderness: There is no abdominal tenderness. There is no guarding or rebound.  Musculoskeletal:        General: Normal range of motion.     Cervical back: Neck supple.  Skin:    General: Skin is warm and dry.     Findings: No rash.  Neurological:     General: No focal deficit present.     Mental Status: She is alert.  Psychiatric:        Mood and Affect: Mood normal.        Behavior: Behavior normal.    ED Results / Procedures / Treatments   Labs (all labs ordered are listed, but only abnormal results are  displayed) Labs Reviewed - No data to display  EKG EKG Interpretation  Date/Time:  Tuesday April 30 2021 13:15:43 EDT Ventricular Rate:  86 PR Interval:  148 QRS Duration: 92 QT Interval:  358 QTC Calculation: 428 R Axis:   96 Text Interpretation: Normal sinus rhythm Right atrial enlargement Rightward axis Pulmonary disease pattern Abnormal ECG since last tracing no significant change Confirmed by Noemi Chapel [phone]) on 04/30/2021 1:21:37 PM  Radiology No results found.  Procedures Procedures   Medications Ordered in ED Medications - No data to display  ED Course  I have reviewed the triage vital signs and the nursing notes.  Pertinent labs & imaging results that were available during my care of the patient were reviewed by me and considered in my medical decision making (see chart for details).    MDM Rules/Calculators/A&P                         Cheryl Haley is a 65 year old female with history of COPD normally on 2 L of oxygen who presents to the emergency department today for further evaluation of left sided chest wall pain.  Physical exam is reassuring for any signs of shingles.  There was no evidence of rash.  Her lungs were clear to auscultation with signs of air trapping given her chronic underlying lung conditions.  I am concerned for possible PE versus pneumonia.  She has been afebrile and not tachycardic.  CMP is normal.  CBC is normal.  D-dimer is elevated with CTA pending.  Chest x-ray revealed possible left-sided pneumonia.  Given the clinical picture, I am concerned for possible PE or pneumonia.  She was placed on amoxicillin yesterday by her primary care provider.  If CTA is  negative we will plan to add azithromycin or doxycycline for additional atypical coverage given her history of COPD for pneumonia.  We will have her follow-up with her primary care provider.   Her care was transitioned to [facility], PA-C at shift change.    Final Clinical  Impression(s) / ED Diagnoses Final diagnoses:  None    Rx / DC Orders ED Discharge Orders     None        Cherrie Gauze 04/30/21 1536    Noemi Chapel, MD 05/12/21 [phone]

## 2021-04-30 NOTE — ED Provider Notes (Signed)
Medical screening examination/treatment/procedure(s) were conducted as a shared visit with non-physician practitioner(s) and myself.  I personally evaluated the patient during the encounter.  Clinical Impression:   Final diagnoses:  None      Pt has had cough worse in the last week Pain in the L chest and back with breathinga but constant Saw PCP and started amox - no w/'u done yesteray On exam on home O2 - sat's 97% - has neg swelling, normal skin Dec lung sounds - check d dimer - albuterol neb - r/o PTX with CXR Pt in agreement   EKG Interpretation  Date/Time:  Tuesday April 30 2021 13:15:43 EDT Ventricular Rate:  86 PR Interval:  148 QRS Duration: 92 QT Interval:  358 QTC Calculation: 428 R Axis:   96 Text Interpretation: Normal sinus rhythm Right atrial enlargement Rightward axis Pulmonary disease pattern Abnormal ECG since last tracing no significant change Confirmed by Noemi Chapel [phone]) on 04/30/2021 1:21:37 PM          Noemi Chapel, MD 05/12/21 [phone]

## 2021-04-30 NOTE — ED Triage Notes (Signed)
C/o left side chest/breast pain x 4 days-pain worse with movement "sometimes"-states she has had a cough since +covid in July-pt to triage in w/c with O2 3L [place]-NAD

## 2021-04-30 NOTE — ED Provider Notes (Signed)
  Physical Exam  BP 113/70 (BP Location: Right Arm)   Pulse 82   Temp 98 F (36.7 C) (Oral)   Resp 19   Ht '5\' 4"'$  (1.626 m)   Wt 41.7 kg   SpO2 93%   BMI 15.79 kg/m   Physical Exam Vitals and nursing note reviewed.  Constitutional:      General: She is not in acute distress.    Appearance: She is well-developed.  HENT:     Head: Normocephalic and atraumatic.  Eyes:     Extraocular Movements: Extraocular movements intact.  Cardiovascular:     Rate and Rhythm: Normal rate.  Pulmonary:     Effort: Pulmonary effort is normal.  Abdominal:     General: There is no distension.  Musculoskeletal:        General: Normal range of motion.     Cervical back: Normal range of motion.  Skin:    General: Skin is warm.     Findings: No rash.  Neurological:     Mental Status: She is alert and oriented to person, place, and time.     ED Course/Procedures     Procedures  MDM  Patient signed out to me by C. Raul Del, PA-C.  Please see previous notes for further history.  In brief, patient presented for evaluation of 4-day history of left-sided chest wall pain.  Pain is intermittent.  Saw PCP yesterday, presented with started on amoxicillin for pneumonia.  Chest pain is worse today.  Patient with a history of COPD, at baseline on 2 L and has not needed to increase this.  Pain is worse with movement and inspiration.  Labs overall reassuring.  Chest x-ray does show a pneumonia, however patient's dimer is elevated at 2.12.  As such, CT pending.  If negative, plan for discharge with pneumonia treatment.  CTA negative for PE.  Does show new/enlarging lung nodules.  I discussed this finding with patient and sister.  I encourage close follow-up with her oncologist/pulmonologist.  Discussed changing antibiotic regimen for more appropriate coverage for CAP pneumonia.  At this time, pt appears safe for d/c. Return precautions given. Pt states she understands and agrees to plan.       Franchot Heidelberg, PA-C 04/30/21 1830    Hayden Rasmussen, MD 05/01/21 1143

## 2021-05-06 ENCOUNTER — Other Ambulatory Visit: Payer: Self-pay | Admitting: Internal Medicine

## 2021-05-10 ENCOUNTER — Encounter: Payer: Self-pay | Admitting: Adult Health

## 2021-05-10 ENCOUNTER — Ambulatory Visit (INDEPENDENT_AMBULATORY_CARE_PROVIDER_SITE_OTHER): Payer: Medicare Other | Admitting: Adult Health

## 2021-05-10 ENCOUNTER — Other Ambulatory Visit: Payer: Self-pay

## 2021-05-10 VITALS — BP 140/64 | HR 91 | Temp 98.2°F | Ht 64.0 in | Wt 94.8 lb

## 2021-05-10 DIAGNOSIS — Z23 Encounter for immunization: Secondary | ICD-10-CM

## 2021-05-10 DIAGNOSIS — R918 Other nonspecific abnormal finding of lung field: Secondary | ICD-10-CM

## 2021-05-10 DIAGNOSIS — E43 Unspecified severe protein-calorie malnutrition: Secondary | ICD-10-CM | POA: Diagnosis not present

## 2021-05-10 DIAGNOSIS — U071 COVID-19: Secondary | ICD-10-CM | POA: Diagnosis not present

## 2021-05-10 DIAGNOSIS — J441 Chronic obstructive pulmonary disease with (acute) exacerbation: Secondary | ICD-10-CM

## 2021-05-10 NOTE — Patient Instructions (Addendum)
Continue on BREZTRI 2 puffs Twice daily  , rinse after use.  Albuterol inhaler or neb As needed   Continue on Prednisone '5mg'$  daily  Continue on Daliresp daily  Continue on Oxygen 2l/m .  Activity as tolerated.  Flu shot today .  High protein diet .  CT chest without contrast in 4-6 weeks .  Follow up with Dr. Chase Caller  or Meckenzie Balsley NP in 6 weeks and As needed   Please contact office for sooner follow up if symptoms do not improve or worsen or seek emergency care

## 2021-05-10 NOTE — Addendum Note (Signed)
Addended by: Vanessa Barbara on: 05/10/2021 06:02 PM   Modules accepted: Orders

## 2021-05-10 NOTE — Progress Notes (Signed)
$'@Patient'N$  ID: Cheryl Haley, female    DOB: 1956/05/16, 65 y.o.   MRN: [id number]  Chief Complaint  Patient presents with   Follow-up    Referring provider: [facility] Jerilynn Mages*  HPI: 65 year old female former smoker  followed for COPD and chronic respiratory failure on oxygen, pulmonary nodules On chronic steroids (prednisone '5mg'$  daily started 03/2021 )    TEST/EVENTS :  Chest Imaging- films reviewed: CT chest 06/28/2019- severe centrilobular emphysema throughout both lungs.  Left upper lobe spiculated nodule associated with linear scar.  Right middle lobe spiculated nodule.  Airway thickening.  Scarring in right greater than left lower lobes.  No significant mediastinal or hilar adenopathy.  Nodules stable or smaller than previous CT scans.   CT cardiac scoring 10/07/2019- redemonstrated right middle lobe nodule.  No new concerning nodules.   Pulmonary Functions Testing Results: No flowsheet data found.     Spirometry 2015: FVC  2.0 (63%) FEV1  0.8 (33%) Ratio 41   10/25/2019 SPECT scan: EF 63%, low risk study.  No EKG changes during stress.  05/10/2021 Follow up : COPD, O2 RF and Pneumonia  Patient returns for a follow-up visit.  Patient has underlying severe COPD and chronic hypoxic respiratory failure on oxygen.  Patient was recently hospitalized last month for a COPD exacerbation.  Prior to admission patient was diagnosed with COVID-19 March 19, 2021.  She was treated with IV steroids, and antibiotics nebulized bronchodilators.  Since discharge patient continued to have some ongoing symptoms of cough and shortness of breath.  2 weeks ago patient started having increased pleuritic pain shortness of breath.  He went to the emergency room April 30, 2021, CT chest showed no pulmonary embolism.  Positive for a lingular consolidation consistent with pneumonia.  Advanced emphysema.  Previous left apical nodularity was improved.  Right lower lobe nodule enlarged, and a new nodule in  the right upper lobe.  And a new right lower lobe nodule noted.  Patient was started on Augmentin and doxycycline.  Finished yesterday . Since emergency room visit patient is starting to feel better, with decreased cough and congestion . Regaining some strength , trying to walk for short distances .  Weight is down 23lbs over last year . Has gained 5 lbs since getting home and is eating better.  No hemoptysis , edema or n//v/d.    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     Pneumonia    Protein-calorie malnutrition, severe 09/17/2018   Pulmonary nodules 07/14/2018   07/2017 CT chest >pulmonary nodules noted.  07/2017 PET scan -Hypermetabolic LUL nodule  [id number] CT chest >decreased nodule size, new pulmonary nodules noted.  FOB -atypical cells  CT chest 07/2018 >stable nodules to smaller on established nodules . 2 new nodules RUL , RLL >CT chest 3 months    Thyroid disease    Tobacco abuse 11/28/2015   Smoked 2 packs of cigarettes daily from teenage years until age 45, quit in 2016    Unspecified hypothyroidism 09/20/2013   Viral respiratory infection 09/16/2018    Tobacco History: Social  History   Tobacco Use  Smoking Status Former   Packs/day: 2.00   Years: 40.00   Pack years: 80.00   Types: Cigarettes   Quit date: 03/16/2017   Years since quitting: 4.1  Smokeless Tobacco Never  Tobacco Comments       Counseling given: Not Answered Tobacco comments:     Outpatient Medications Prior to Visit  Medication Sig Dispense Refill   albuterol (VENTOLIN HFA) 108 (90 Base) MCG/ACT inhaler Inhale 2 puffs into the lungs every 6 (six) hours as needed for wheezing or shortness of breath. 8 g 6   aspirin EC 81 MG tablet Take 81 mg daily by mouth.      BREZTRI AEROSPHERE 160-9-4.8 MCG/ACT AERO INHALE 2 PUFFS IN THE MORNING AND AT BEDTIME 11 g 0   feeding supplement (ENSURE ENLIVE / ENSURE PLUS) LIQD Take 237 mLs by mouth 2 (two) times daily between meals. 237 mL 12   hydroxyurea (HYDREA) 500 MG capsule TAKE 1 CAPSULE BY MOUTH EVERY OTHER DAY (MAY  TAKE  WITH  FOOD  TO  MINIMIZE  GI  SIDE  EFFECTS) 15 capsule 0   levothyroxine (SYNTHROID) 88 MCG tablet Take 88 mcg by mouth daily before breakfast.     metoprolol succinate (TOPROL-XL) 25 MG 24 hr tablet Take 25 mg by mouth daily.     OXYGEN Inhale 2 L into the lungs continuous.     predniSONE (DELTASONE) 5 MG tablet Take 1 tablet (5 mg total) by mouth daily with breakfast. 30 tablet 5   roflumilast (DALIRESP) 500 MCG TABS tablet Take 1 tablet (500 mcg total) by mouth daily. 30 tablet 5   rosuvastatin (CRESTOR) 40 MG tablet Take 1 tablet (40 mg total) by mouth daily. 90 tablet 3   amoxicillin-clavulanate (AUGMENTIN) 875-125 MG tablet Take 1 tablet by mouth every 12 (twelve) hours. (Patient not taking: Reported on 05/10/2021) 14 tablet 0   No facility-administered medications prior to visit.     Review of Systems:   Constitutional:   No  weight loss, night sweats,  Fevers, chills,  +fatigue, or  lassitude.  HEENT:   No headaches,  Difficulty swallowing,  Tooth/dental problems, or  Sore throat,                No sneezing, itching,  ear ache,  +nasal congestion, post nasal drip,   CV:  No chest pain,  Orthopnea, PND, swelling in lower extremities, anasarca, dizziness, palpitations, syncope.   GI  No heartburn, indigestion, abdominal pain, nausea, vomiting, diarrhea, change in bowel habits, loss of appetite, bloody stools.   Resp:   No chest wall deformity  Skin: no rash or lesions.  GU: no dysuria, change in color of urine, no urgency or frequency.  No flank pain, no hematuria   MS:  No joint pain or swelling.  No decreased range of motion.  No back pain.    Physical Exam  BP 140/64 (BP Location: Left Arm, Patient Position: Sitting, Cuff Size: Normal)   Pulse 91   Temp 98.2 F (36.8 C) (Oral)   Ht '5\' 4"'$  (1.626  m)   Wt 94 lb 12.8 oz (43 kg)   SpO2 93%   BMI 16.27 kg/m   GEN: A/Ox3; pleasant , NAD, elderly , on o2 , rolling walker    HEENT:  [place]/AT,  NOSE-clear, THROAT-clear, no lesions, no postnasal drip or exudate noted.   NECK:  Supple w/ fair ROM; no JVD; normal carotid impulses w/o bruits; no thyromegaly or nodules palpated; no lymphadenopathy.    RESP  Decreased BS in bases , w/o, wheezes/ rales/ or rhonchi. no accessory muscle use, no dullness to percussion  CARD:  RRR, no m/r/g, no peripheral edema, pulses intact, no cyanosis or clubbing.  GI:   Soft & nt; nml bowel sounds; no organomegaly or masses detected.   Musco: Warm bil, no deformities or joint swelling noted.   Neuro: alert, no focal deficits noted.    Skin: Warm, no lesions or rashes    Lab Results:  CBC    Component Value Date/Time   WBC 7.6 04/30/2021 1423   RBC 3.83 (L) 04/30/2021 1423   HGB 12.0 04/30/2021 1423   HGB 12.3 03/26/2021 1005   HCT 36.4 04/30/2021 1423   PLT 408 (H) 04/30/2021 1423   PLT 378 03/26/2021 1005   MCV 95.0 04/30/2021 1423   MCH 31.3 04/30/2021 1423   MCHC 33.0 04/30/2021 1423   RDW 17.3 (H) 04/30/2021 1423   LYMPHSABS 0.5 (L) 04/30/2021 1423   MONOABS 0.5 04/30/2021 1423   EOSABS 0.0  04/30/2021 1423   BASOSABS 0.0 04/30/2021 1423    BMET    Component Value Date/Time   NA 135 04/30/2021 1423   K 4.1 04/30/2021 1423   CL 98 04/30/2021 1423   CO2 26 04/30/2021 1423   GLUCOSE 146 (H) 04/30/2021 1423   BUN 16 04/30/2021 1423   CREATININE 0.61 04/30/2021 1423   CREATININE 0.78 03/26/2021 1005   CALCIUM 9.1 04/30/2021 1423   GFRNONAA >60 04/30/2021 1423   GFRNONAA >60 03/26/2021 1005   GFRAA >60 05/04/2020 1132    BNP No results found for: BNP  ProBNP    Component Value Date/Time   PROBNP 256.1 (H) 09/20/2013 1640    Imaging: CT Angio Chest PE W and/or Wo Contrast  Result Date: 04/30/2021 CLINICAL DATA:  Pulmonary embolism suspected. Low/intermediate probability. Elevated D-dimer levels. EXAM: CT ANGIOGRAPHY CHEST WITH CONTRAST TECHNIQUE: Multidetector CT imaging of the chest was performed using the standard protocol during bolus administration of intravenous contrast. Multiplanar CT image reconstructions and MIPs were obtained to evaluate the vascular anatomy. CONTRAST:  36m OMNIPAQUE IOHEXOL 350 MG/ML SOLN COMPARISON:  Chest CTA 08/22/2020 and 06/28/2020. Radiographs earlier today and 03/28/2021. FINDINGS: Cardiovascular: The pulmonary arteries are well opacified with contrast to the level of the subsegmental branches. There is no evidence of acute pulmonary embolism. Diffuse atherosclerosis of the aorta, great vessels and coronary arteries. There are calcifications of the mitral annulus. A small pericardial effusion is unchanged. The heart size is normal. Mediastinum/Nodes: There are no enlarged mediastinal, hilar or axillary lymph nodes. The thyroid gland, trachea and esophagus demonstrate no significant findings. Lungs/Pleura: New small dependent left pleural effusion. No right pleural effusion or pneumothorax. Again demonstrated are advanced changes of centrilobular and paraseptal emphysema. As seen on the earlier radiographs, there is new consolidation within  the inferior segment of the lingula, most consistent with pneumonia. Previously noted left apical nodularity appears improved (image 23/6), likely postinflammatory scarring. However, an inferior nodule inferiorly in the right upper lobe has enlarged, measuring up to 1.6  x 0.6 cm on image 45/6. This appears lobulated on the reformatted images, suspicious for neoplasm. In addition, there is a new nodule more posteriorly in the right upper lobe, measuring 1.5 x 0.9 cm on image 37/6 and a new small right lower lobe nodule measuring 5 mm on image 63/6. Vague nodular density more inferiorly in the right lower lobe is unchanged, best seen on image 24/9. No suspicious left lung nodules. Upper abdomen: The visualized upper abdomen appears stable, without suspicious findings. Musculoskeletal/Chest wall: There is no chest wall mass or suspicious osseous finding. Review of the MIP images confirms the above findings. IMPRESSION: 1. No evidence of acute pulmonary embolism or other acute vascular findings. 2. Lingular consolidation consistent with pneumonia. 3. There are 3 new/enlarging right lung nodules, at least one of which is suspicious for malignancy. Referral to multi disciplinary thoracic oncology clinic recommended. Patient may benefit from PET-CT after the lingular pneumonia has resolved. Alternatively, short-term CT follow-up in 3 months could be performed. This recommendation follows the consensus statement: Guidelines for Management of Small Pulmonary Nodules Detected on CT Images: From the Fleischner Society 2017; Radiology 2017; 284:228-243. 4. Aortic Atherosclerosis (ICD10-I70.0) and Emphysema (ICD10-J43.9). Electronically Signed   By: Richardean Sale M.D.   On: 04/30/2021 16:55   DG Chest Portable 1 View  Result Date: 04/30/2021 CLINICAL DATA:  Left chest pain. EXAM: PORTABLE CHEST 1 VIEW COMPARISON:  March 28, 2021. FINDINGS: The heart size and mediastinal contours are within normal limits. Emphysematous  disease is noted in both lungs. Interval development of left lingular opacity consistent with pneumonia. No pneumothorax or pleural effusion is noted. The visualized skeletal structures are unremarkable. IMPRESSION: Interval development of left lingular opacity concerning for pneumonia. Aortic Atherosclerosis (ICD10-I70.0) and Emphysema (ICD10-J43.9). Electronically Signed   By: Marijo Conception M.D.   On: 04/30/2021 14:39      No flowsheet data found.  No results found for: NITRICOXIDE      Assessment & Plan:   COPD exacerbation ([place]) Recent prolonged COPD exacerbation following COVID-19 infection-with hospitalization.  Patient has required multiple antibiotics and steroid burst.  She is now finally starting to turn the corner and feel better.  She is continue on her triple therapy.  Continue on Daliresp.  She is on low-dose prednisone.  Patient education on chronic steroids. Will continue to monitor very closely.  Plan  Patient Instructions  Continue on BREZTRI 2 puffs Twice daily  , rinse after use.  Albuterol inhaler or neb As needed   Continue on Prednisone '5mg'$  daily  Continue on Daliresp daily  Continue on Oxygen 2l/m .  Activity as tolerated.  Flu shot today .  High protein diet .  CT chest without contrast in 4-6 weeks .  Follow up with Dr. Chase Caller  or Oluwaseyi Raffel NP in 6 weeks and As needed   Please contact office for sooner follow up if symptoms do not improve or worsen or seek emergency care       Protein-calorie malnutrition, severe Patient has ongoing weight loss questionable etiology.  Patient is on [place].  Says that she has a good appetite with no nausea vomiting or diarrhea.  However if her weight continues to trend down.  May need to consider holding Daliresp as it could contribute to this. CT chest in 4 to 6 weeks.  Pulmonary nodules LindaRecent acute illness with COVID-19, COPD exacerbation and rare pneumonia.  CT chest also showed enlarging right lower lobe  nodule, new right upper lobe and right  lower lobe nodules.  This will need close follow-up as patient is high risk for underlying malignancy. She has now completed antibiotics completely yesterday.  We will repeat a CT chest in 4 weeks.  If nodules are not changed or increased in size will need a pet scan  Plan  Patient Instructions  Continue on BREZTRI 2 puffs Twice daily  , rinse after use.  Albuterol inhaler or neb As needed   Continue on Prednisone '5mg'$  daily  Continue on Daliresp daily  Continue on Oxygen 2l/m .  Activity as tolerated.  Flu shot today .  High protein diet .  CT chest without contrast in 4-6 weeks .  Follow up with Dr. Chase Caller  or Karlita Lichtman NP in 6 weeks and As needed   Please contact office for sooner follow up if symptoms do not improve or worsen or seek emergency care       COVID Recent [id number] infection complicated by prolonged symptoms with COPD exacerbation and pneumonia.  Patient is clinically starting to improve.  Plan  . Patient Instructions  Continue on BREZTRI 2 puffs Twice daily  , rinse after use.  Albuterol inhaler or neb As needed   Continue on Prednisone '5mg'$  daily  Continue on Daliresp daily  Continue on Oxygen 2l/m .  Activity as tolerated.  Flu shot today .  High protein diet .  CT chest without contrast in 4-6 weeks .  Follow up with Dr. Chase Caller  or Cyana Shook NP in 6 weeks and As needed   Please contact office for sooner follow up if symptoms do not improve or worsen or seek emergency care         Rexene Edison, NP 05/10/2021

## 2021-05-10 NOTE — Assessment & Plan Note (Signed)
LindaRecent acute illness with COVID-19, COPD exacerbation and rare pneumonia.  CT chest also showed enlarging right lower lobe nodule, new right upper lobe and right lower lobe nodules.  This will need close follow-up as patient is high risk for underlying malignancy. She has now completed antibiotics completely yesterday.  We will repeat a CT chest in 4 weeks.  If nodules are not changed or increased in size will need a pet scan  Plan  Patient Instructions  Continue on BREZTRI 2 puffs Twice daily  , rinse after use.  Albuterol inhaler or neb As needed   Continue on Prednisone '5mg'$  daily  Continue on Daliresp daily  Continue on Oxygen 2l/m .  Activity as tolerated.  Flu shot today .  High protein diet .  CT chest without contrast in 4-6 weeks .  Follow up with Dr. Chase Caller  or Alik Mawson NP in 6 weeks and As needed   Please contact office for sooner follow up if symptoms do not improve or worsen or seek emergency care

## 2021-05-10 NOTE — Assessment & Plan Note (Signed)
Patient has ongoing weight loss questionable etiology.  Patient is on [place].  Says that she has a good appetite with no nausea vomiting or diarrhea.  However if her weight continues to trend down.  May need to consider holding Daliresp as it could contribute to this. CT chest in 4 to 6 weeks.

## 2021-05-10 NOTE — Assessment & Plan Note (Signed)
Recent [id number] infection complicated by prolonged symptoms with COPD exacerbation and pneumonia.  Patient is clinically starting to improve.  Plan  . Patient Instructions  Continue on BREZTRI 2 puffs Twice daily  , rinse after use.  Albuterol inhaler or neb As needed   Continue on Prednisone '5mg'$  daily  Continue on Daliresp daily  Continue on Oxygen 2l/m .  Activity as tolerated.  Flu shot today .  High protein diet .  CT chest without contrast in 4-6 weeks .  Follow up with Dr. Chase Caller  or Brylon Brenning NP in 6 weeks and As needed   Please contact office for sooner follow up if symptoms do not improve or worsen or seek emergency care

## 2021-05-10 NOTE — Assessment & Plan Note (Signed)
Recent prolonged COPD exacerbation following COVID-19 infection-with hospitalization.  Patient has required multiple antibiotics and steroid burst.  She is now finally starting to turn the corner and feel better.  She is continue on her triple therapy.  Continue on Daliresp.  She is on low-dose prednisone.  Patient education on chronic steroids. Will continue to monitor very closely.  Plan  Patient Instructions  Continue on BREZTRI 2 puffs Twice daily  , rinse after use.  Albuterol inhaler or neb As needed   Continue on Prednisone '5mg'$  daily  Continue on Daliresp daily  Continue on Oxygen 2l/m .  Activity as tolerated.  Flu shot today .  High protein diet .  CT chest without contrast in 4-6 weeks .  Follow up with Dr. Chase Caller  or Conley Pawling NP in 6 weeks and As needed   Please contact office for sooner follow up if symptoms do not improve or worsen or seek emergency care

## 2021-05-12 ENCOUNTER — Other Ambulatory Visit: Payer: Self-pay | Admitting: Internal Medicine

## 2021-06-01 IMAGING — DX DG CHEST 2V
2 series · 2 of 2 positions shown · non-contrast
Comparison: August 21, 2020 chest radiograph; chest CT August 23, 2019

CLINICAL DATA: Cough

EXAM:
CHEST - 2 VIEW

[chest pa]
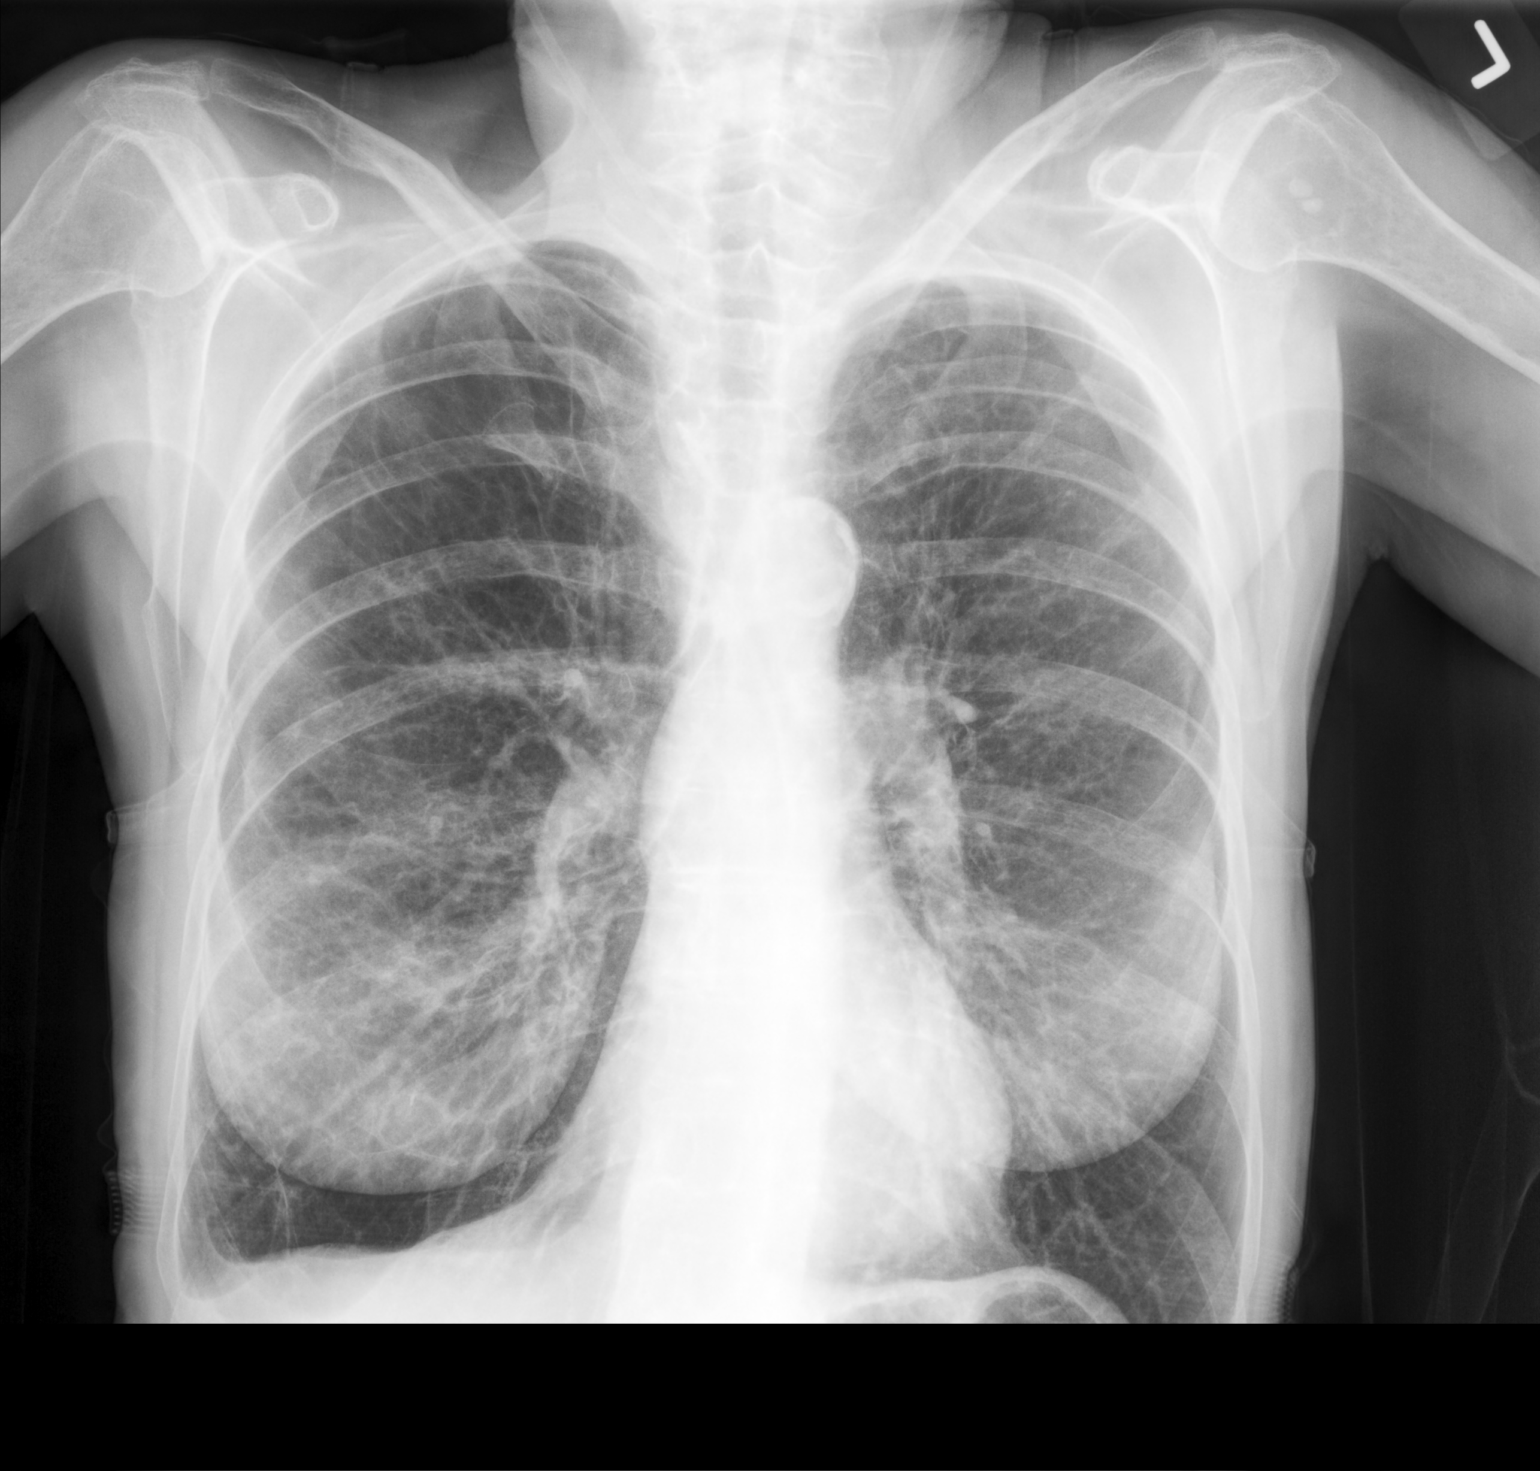

[chest lat]
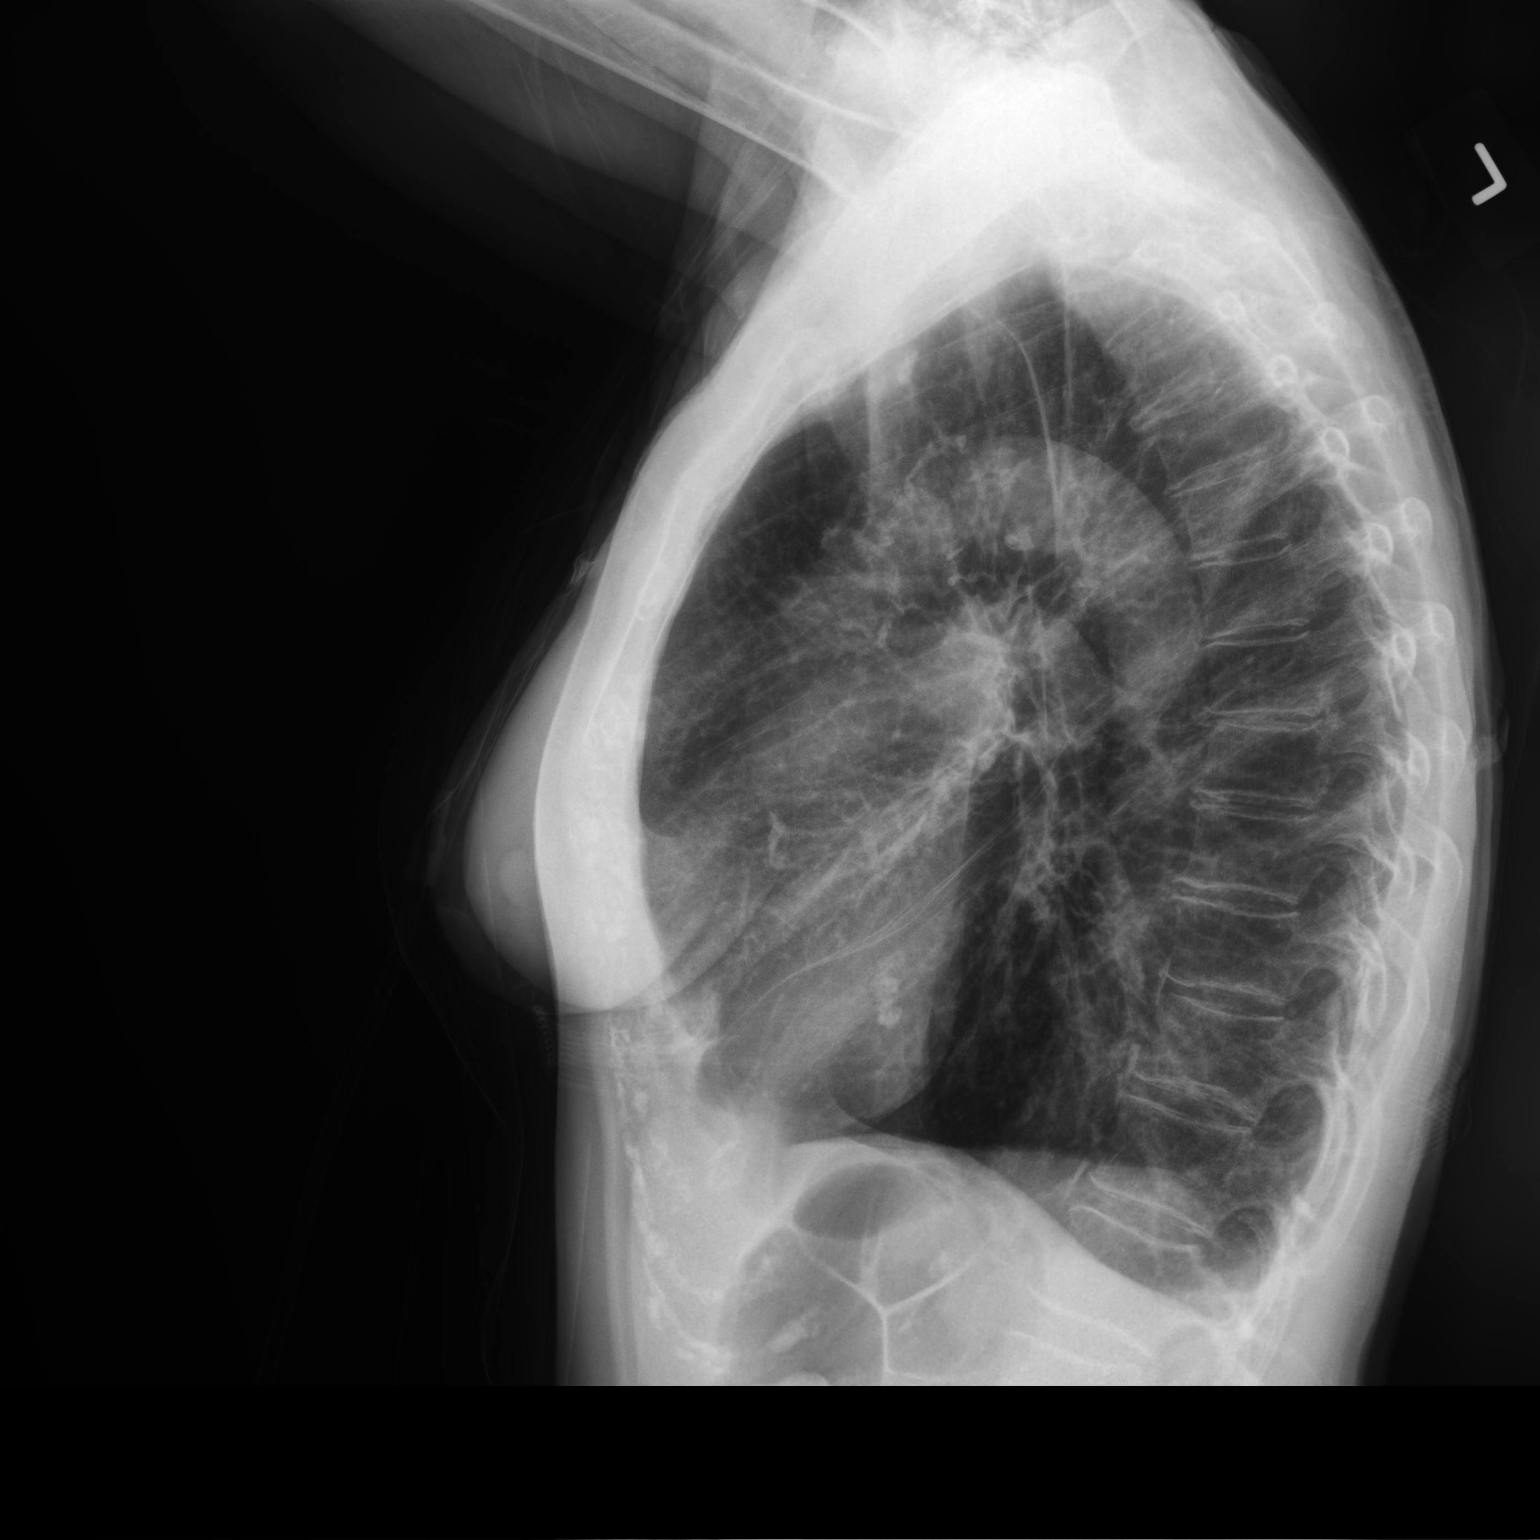

[2 of 2 positions shown; findings below may reference images not displayed]

FINDINGS: There is underlying emphysematous change, similar in appearance
compared to prior recent studies. There is chronic blunting of the
right costophrenic angle. There is no edema or airspace opacity.
Interstitial thickening is felt to be due to underlying scarring and
probable chronic bronchitis. Nodular opacity seen on recent CT are
not appreciable by radiography. Heart size is normal. Pulmonary
vascularity is stable, reflecting underlying emphysematous change.
No adenopathy. There is aortic atherosclerosis. Stable appearing
bony structures.
IMPRESSION: Underlying emphysematous change with areas of interstitial
thickening and mild scarring, stable. No edema or airspace opacity.
Stable cardiac silhouette. There is aortic atherosclerosis.

Aortic Atherosclerosis (NFEFX-NSJ.J) and Emphysema (NFEFX-CTE.T).

## 2021-06-03 ENCOUNTER — Other Ambulatory Visit: Payer: Self-pay | Admitting: Hematology & Oncology

## 2021-06-03 ENCOUNTER — Other Ambulatory Visit: Payer: Self-pay | Admitting: Internal Medicine

## 2021-06-11 ENCOUNTER — Other Ambulatory Visit: Payer: Self-pay | Admitting: Internal Medicine

## 2021-06-21 ENCOUNTER — Telehealth (HOSPITAL_COMMUNITY): Payer: Self-pay

## 2021-06-21 ENCOUNTER — Encounter (HOSPITAL_COMMUNITY): Payer: Self-pay

## 2021-06-21 NOTE — Telephone Encounter (Signed)
Pt is unable to wear a mask in pulmonary rehab and mask are required. Pt no longer wants to participate in pulmonary rehab. Closed referral.

## 2021-06-24 ENCOUNTER — Ambulatory Visit
Admission: RE | Admit: 2021-06-24 | Discharge: 2021-06-24 | Disposition: A | Discharge status: Home / Self Care | Payer: Medicare Other | Source: Ambulatory Visit | Attending: Adult Health | Admitting: Adult Health

## 2021-06-24 DIAGNOSIS — R918 Other nonspecific abnormal finding of lung field: Secondary | ICD-10-CM

## 2021-06-26 ENCOUNTER — Other Ambulatory Visit: Payer: Self-pay

## 2021-06-26 ENCOUNTER — Encounter: Payer: Self-pay | Admitting: Internal Medicine

## 2021-06-26 ENCOUNTER — Ambulatory Visit (INDEPENDENT_AMBULATORY_CARE_PROVIDER_SITE_OTHER): Payer: Medicare Other | Admitting: Internal Medicine

## 2021-06-26 VITALS — BP 126/78 | HR 85 | Temp 97.7°F | Ht 64.0 in | Wt 98.6 lb

## 2021-06-26 DIAGNOSIS — J449 Chronic obstructive pulmonary disease, unspecified: Secondary | ICD-10-CM

## 2021-06-26 DIAGNOSIS — R918 Other nonspecific abnormal finding of lung field: Secondary | ICD-10-CM | POA: Diagnosis not present

## 2021-06-26 NOTE — Patient Instructions (Addendum)
Pulmonary nodules  - worsening or RML/RUL nodule since Dec 2021 -> Aug 2022 -> Oct 2022. Concerning for early stage lung cancer. Other nodules from Aug 2022 still perisst OCt 2022 and are probably not cancer  Plan  - PET scan next few weeks - see Icard/Byrum after that   Stage 3 severe COPD by GOLD classification ([place]) Chronic resp failure  - stable disease but prior ABG including July 2022 with high CO2  Plan - check abg -> if CO2 high might need night bipap - do spiro/dlco asap -Continue oxygen 2 L nasal cannula continuous -Continue BREZTRI 2 puff 2 times daily  -Use albuterol as needed for emergency rescue [understand that inhaler is not helping you but nebulizer can] -Continue Roflumilast daily -Cotninue chronic prednisone daily 5 mg/day  Follow-up -Icard/Byrum after PET scan  next few weeks (ideally get PFT/ABG before seeing them but not required) - Camarion Weier I 3 months

## 2021-06-26 NOTE — Progress Notes (Signed)
Synopsis: Referred in 2015 for COPD by [name], MD. Formerly a patient of [name].  Subjective:   PATIENT ID: Cheryl Haley GENDER: female DOB: 25-Nov-1955, MRN: [id number]  Chief Complaint  Patient presents with   Follow-up    Patient states that she feels better since last visit in Jan. Patient is on 3 liters with exertion and on 2 liters at home with concentrator. Patient states that last night she coughed up some phelgm and noticed it had some blood in it. She has also noticed blood when blowing her nose so isnt sure if its from her lungs or sinusesl    Cheryl Haley is a 65 year old woman with a history of COPD and chronic respiratory failure on home oxygen who presents for follow-up.  She had an exacerbation requiring hospitalization in January 2020, but has not been hospitalized for COPD otherwise.  Since that exacerbation she has not required antibiotics or prednisone.  She continues on Spiriva once daily and Symbicort 2 puffs twice daily.  She reports that she runs out her Symbicort early.  She just felt an inhaler on 3/25 and reports that she already is less than 20 puffs.  She is spoken to her pharmacist about this.  She has no significant cough or sputum production, wheezing.  Her dyspnea on exertion is at baseline.  She walks slower than her peers and can only walk less than 1 block.  She does not use albuterol due to lack of perceived benefit.  She is able to complete her daily activities.  She is up-to-date on pneumonia and flu vaccines.  She uses 3 L pulsed oxygen with her Elberta Spaniel and is on 2 L continuous at home. Trelegy was previously prescribed in January, which her insurance did not cover.  Former tobacco abuse before quitting in 2018, 80-pack-year history.  Significant Hospital Events:   65 yo female, former smoker, with chronic hypoxic (2L) resp failure who presented with progressive dyspnea and weakness.  She had recent hospitalization at [facility] for COPD  exacerbation (her only one in several years).  She had persistent dyspnea and fever (temp 101).  She is followed in pulmonary office by [name] and to establish with Dr. Dewald.(changed to DR Chase Caller 08/26/2020)   She developed fever, decreased appetite, fatigue and weakness earlier this month.  She was admitted to [facility] and treated for COPD exacerbation.  Several days after discharge she developed recurrence of her symptoms.  She also thinks she has lost about 10 lbs.  She has felt her heart rate going fast.  She quit smoking 3-4 years ago.  No recent travel history or sick exposures.  No animal/bird exposures.   TN, PNA, CVA, HLD, COPD with emphysema, Chronic hypoxic respiratory failure, Hypothyroidism, Hiatal hernia, Essential thrombocythemia, OA, Anxiety, Influenza A January 2015  Spirometry 12/26/13 >> FEV1 0.8 (33%), FEV1% 41 CT angio chest 08/22/20 >> no acute process, negative for PE, bilateral pulmonary nodules, severe background emphysema     12/21 Admit 12/22 PCCM consulted  12/23  - start solumedrol 12/24 - Pt reports feeling better than on admission but not back to baseline .On 2L   1225 = ABG without hypercapnia.  Apprehensive about using BiPAP.  I think she use CPAP for few hours last night.  Subjective improvement not clear.  This morning she tells me that even though she is better since the time we started Solu-Medrol 2 days ago she does not think she is as good as yesterday.  She complains of some dyspnea on exertion.  She is willing to try noninvasive positive pressure ventilation again tonight.  She tells me that hospitalist has agreed to give her some Tylenol PM to help her sleep better and then she would use the mask. She is afebrile since 08/22/2020   xxxxxxxxxxxxxxxxxxxxxxxxxxxxxx   OV 10/23/2020  Subjective:  Patient ID: Cheryl Haley, female , DOB: 1956-05-09 , age 65 y.o. , MRN: [id number] , ADDRESS: [address] [place] [address] PCP [name], MD Patient Care Team: [name], MD as PCP - General (Family Medicine) Sueanne Margarita, MD as PCP - Cardiology (Cardiology)  This Provider for this visit: Treatment Team:  Attending Provider: Brand Males, MD    10/23/2020 -   Chief Complaint  Patient presents with   New Patient (Initial Visit)    COPD   Gold stage III COPD with chronic hypoxemic respiratory failure without hypercapnia -last PFT 2015 Alpha-1 MS  HPI Cheryl Haley 65 y.o. -returns for post hospital follow-up.  I saw him Christmas 2021 for COPD exacerbation.  She has advanced COPD alpha-1 MS.  After that she is followed up with nurse practitioner.  She continues to do well.  She is at home.  She is doing physical therapy 1 time a week.  Her sister joined with he today; Cheryl Haley.  Patient lives in [place].  The sister lives in [place].  She prefers Cheryl Haley for healthcare destination.  She is getting home physical therapy 1 time a week.  She says this is not enough.  I have asked her to talk to the therapist and get a prescription for the second day in a week or have the therapist come 2 times a week.  She uses triple inhaler therapy and oxygen 2 L nasal cannula.  She says even the simplest of exertions make her short of breath but she is slowly better.  She is resigned to the fact she might never go back to her previous baseline but also encouraged by the fact she is slowly improving.  CAT score today 17 documented below.  She is interested in Roflumilast after we discussed this with her.  She is interested in pulmonary rehabilitation after we discussed this with her.  Last PFT was in 2015   CAT Score 10/23/2020  Total CAT Score 17        OV 03/05/2021  Subjective:  Patient ID: Cheryl Haley, female , DOB: Jun 17, 1956 , age 65 y.o. , MRN: [id number] , ADDRESS: [address] [id number] PCP [name], MD Patient Care Team: [name], MD as PCP - General (Family  Medicine) Sueanne Margarita, MD as PCP - Cardiology (Cardiology)  This Provider for this visit: Treatment Team:  Attending Provider: Brand Males, MD   Gold stage III COPD with chronic hypoxemic respiratory failure without hypercapnia  -last PFT 2015 Multiple lung nodules  - 65mm LUL and 53mm Lingula (sice 2018 and decreaed to current size in feb 2020)  Alpha-1 MS  03/05/2021 -   Chief Complaint  Patient presents with   Follow-up    Pt states she has been doing better since last visit. States she still becomes SOB but states that she does not become SOB as quick as before after getting walking cart.     HPI Ranita Stjulien 65 y.o. -returns for follow-up.  She presents with? Siser .  She at this point is continue oxygen 2 L at rest and uses 3 L portable when she  does exercise.  She continues on [place].  She started Roflumilast last visit tolerating it well.  She says albuterol inhaler does not help her but nebulizer does.  She is wondering about using it.  I told her it is just a rescue medication.  She feels COPD is well controlled at this point.  However she did try some prednisone she found at home and she used 10 mg a day for 2 weeks and she found some modest improvement in her symptoms she wants to use prednisone daily.  She agreed to adjust to 5 mg/day.  We discussed the long-term side effects and indications for prednisone and well COPD.  She definitely wants to give it a try.  She has a new primary care nurse practitioner.  Dr. Rex Kras has retired.  She is at the fully register with the nurse practitioner.  She says that she has not had any osteoporosis evaluation.  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  She try to enroll in pulm rehabilitation but has not heard back from [facility].  She thought she was to be doing pulm rehabilitation by now.  Her last CT scan of the chest was in December 2021.  She knows she needs an annual CT chest in December 2022.   CAT Score 10/23/2020   Total CAT Score 17    05/10/2021 Follow up : COPD, O2 RF and Pneumonia  HPI: 65 year old female former smoker  followed for COPD and chronic respiratory failure on oxygen, pulmonary nodules On chronic steroids (prednisone 5mg  daily started 03/2021 )    TEST/EVENTS :  Chest Imaging- films reviewed: CT chest 06/28/2019- severe centrilobular emphysema throughout both lungs.  Left upper lobe spiculated nodule associated with linear scar.  Right middle lobe spiculated nodule.  Airway thickening.  Scarring in right greater than left lower lobes.  No significant mediastinal or hilar adenopathy.  Nodules stable or smaller than previous CT scans.   CT cardiac scoring 10/07/2019- redemonstrated right middle lobe nodule.  No new concerning nodules.   Pulmonary Functions Testing Results: No flowsheet data found.     Spirometry 2015: FVC  2.0 (63%) FEV1  0.8 (33%) Ratio 41   10/25/2019 SPECT scan: EF 63%, low risk study.  No EKG changes during stress.  Patient returns for a follow-up visit.  Patient has underlying severe COPD and chronic hypoxic respiratory failure on oxygen.  Patient was recently hospitalized last month for a COPD exacerbation.  Prior to admission patient was diagnosed with COVID-19 March 19, 2021.  She was treated with IV steroids, and antibiotics nebulized bronchodilators.  Since discharge patient continued to have some ongoing symptoms of cough and shortness of breath.  2 weeks ago patient started having increased pleuritic pain shortness of breath.  He went to the emergency room April 30, 2021, CT chest showed no pulmonary embolism.  Positive for a lingular consolidation consistent with pneumonia.  Advanced emphysema.  Previous left apical nodularity was improved.  Right lower lobe nodule enlarged, and a new nodule in the right upper lobe.  And a new right lower lobe nodule noted.  Patient was started on Augmentin and doxycycline.  Finished yesterday . Since emergency room visit  patient is starting to feel better, with decreased cough and congestion . Regaining some strength , trying to walk for short distances .  Weight is down 23lbs over last year . Has gained 5 lbs since getting home and is eating better.  No hemoptysis , edema or n//v/d.  OV 06/26/2021  Subjective:  Patient ID: Cheryl Haley, female , DOB: 07-26-1956 , age 13 y.o. , MRN: [id number] , ADDRESS: [place] [id number] PCP [employer], [place] [facility] [place] Patient Care Team: [place], [place] [facility] [place] as PCP - General (Family Medicine) Sueanne Margarita, MD as PCP - Cardiology (Cardiology)  This Provider for this visit: Treatment Team:  Attending Provider: Melvenia Needles, NP    06/26/2021 -   Chief Complaint  Patient presents with   Follow-up    Pt states she has been doing okay since last visit. Pt still becomes SOB with exertion.    Gold stage III COPD with chronic hypoxemic respiratory failure without hypercapnia in dec 2021 but positive hypercapnia July 2022 -last PFT 2015  Multiple lung nodules   Alpha-1 MS  HPI Shanti Newcomer 64 y.o. - dping well from symptoms standpoint. Continues o2. Had CT in ER - has cluster of right sided nodule. One of them (personally visualized) on right side is lobulated . Looking back was small in Dec 2021 and now grown Aug2022/Oct 2022. Agreee with radiology is concering for early stage lung cancer. Review of labs shows high co2 recently. Continues berztri, o2 and rofluminalst and daily prednisone   Results for JESUS, POPLIN (MRN [id number]) as of 06/26/2021 22:41  Ref. Range 03/28/2021 19:41  pH, Ven Latest Ref Range: 7.250 - 7.430  7.345  pCO2, Ven Latest Ref Range: 44.0 - 60.0 mmHg 52.6  pO2, Ven Latest Ref Range: 32.0 - 45.0 mmHg 43.3   CT Chest data  CT Chest Wo Contrast  Result Date: 06/25/2021 CLINICAL DATA:  Follow up pulmonary nodule. History of COPD/emphysema. No history of  malignancy or prior relevant surgery. EXAM: CT CHEST WITHOUT CONTRAST TECHNIQUE: Multidetector CT imaging of the chest was performed following the standard protocol without IV contrast. COMPARISON:  Chest CT 04/30/2021 and 08/22/2020. FINDINGS: Cardiovascular: Extensive atherosclerosis of the aorta, great vessels and coronary arteries again noted. No acute vascular findings on noncontrast imaging. There are prominent mitral annular calcifications. A small amount of pericardial fluid appears stable. The heart size is normal. Mediastinum/Nodes: There are no enlarged mediastinal, hilar or axillary lymph nodes.Hilar assessment is limited by the lack of intravenous contrast, although the hilar contours appear unchanged. The thyroid gland, trachea and esophagus demonstrate no significant findings. Lungs/Pleura: No pleural effusion or pneumothorax. Severe centrilobular and paraseptal emphysema again noted. Small focus of probable left upper lobe scarring on image 28/5 is stable. The airspace disease in the lingula seen on the most recent study has nearly completely resolved, consistent with resolving pneumonia. The new right lung nodule seen on the most recent study persist. The largest is in the right upper lobe, measuring 1.7 x 0.6 cm on image 61/5 and appears lobulated on the reformatted images, remaining suspicious for bronchogenic carcinoma. There are 2 adjacent nodules more superiorly in the right upper lobe which also persists, measuring 1.3 x 0.7 cm on image 45/5 and 1.4 x 0.6 cm on image 49/5. 4 mm right lower lobe nodule on image 86/5 is unchanged from the most recent study, although new from 2021. No new nodules are demonstrated over this 2 month interval. Upper abdomen: The visualized upper abdomen appears stable without suspicious findings. Musculoskeletal/Chest wall: There is no chest wall mass or suspicious osseous finding. IMPRESSION: 1. Interval near complete resolution of previously demonstrated lingular  infiltrate, consistent with resolving pneumonia. 2. The 3 suspicious right lung nodules seen on the most  recent study persist, and based on multiplicity could be postinflammatory. However, at least one of these inferiorly in the right upper lobe remains suspicious for malignancy based on morphology. Recommend further evaluation with PET-CT or tissue sampling. 3. No new nodules have developed in the interval. 4. Aortic Atherosclerosis (ICD10-I70.0) and Emphysema (ICD10-J43.9). Electronically Signed   By: Richardean Sale M.D.   On: 06/25/2021 14:15      PFT  No flowsheet data found.     has a past medical history of Anxiety, Arthritis, COPD (chronic obstructive pulmonary disease) ([place]), Emphysema lung ([place]), Essential thrombocythemia ([place]) (09/15/2019), History of hiatal hernia, TIA (transient ischemic attack) and stroke, Hyperlipidemia, Hypertension, Hypothyroidism, Impaired fasting glucose, Pleural effusion (05/15/2014), Pneumonia, Protein-calorie malnutrition, severe (09/17/2018), Pulmonary nodules (07/14/2018), Thyroid disease, Tobacco abuse (11/28/2015), Unspecified hypothyroidism (09/20/2013), and Viral respiratory infection (09/16/2018).   reports that she quit smoking about 4 years ago. Her smoking use included cigarettes. She has a 80.00 pack-year smoking history. She has never used smokeless tobacco.  Past Surgical History:  Procedure Laterality Date   ESOPHAGOGASTRODUODENOSCOPY     VIDEO BRONCHOSCOPY WITH ENDOBRONCHIAL NAVIGATION Left 07/29/2017   Procedure: VIDEO BRONCHOSCOPY WITH ENDOBRONCHIAL NAVIGATION;  Surgeon: Collene Gobble, MD;  Location: [facility];  Service: Thoracic;  Laterality: Left;    No Known Allergies  Immunization History  Administered Date(s) Administered   Fluad Quad(high Dose 65+) 06/06/2020, 05/10/2021   Influenza Split 05/31/2015, 06/07/2018, 05/09/2019   Influenza,inj,Quad PF,6+ Mos 09/22/2013, 06/10/2016, 06/25/2017, 04/30/2018, 06/01/2019   Influenza,inj,quad,  With Preservative 09/07/2015   Pneumococcal Conjugate-13 04/17/2015   Pneumococcal Polysaccharide-23 12/08/2016   Tdap 04/10/2017    Family History  Problem Relation Age of Onset   Emphysema Father    Heart disease Father    Emphysema Brother    Emphysema Sister    Emphysema Sister    Heart disease Mother    Heart disease Sister    Heart disease Brother    Heart disease Brother    Congestive Heart Failure Sister    Congestive Heart Failure Sister    Cancer Sister        lung   Cancer Sister        breast     Current Outpatient Medications:    albuterol (VENTOLIN HFA) 108 (90 Base) MCG/ACT inhaler, Inhale 2 puffs into the lungs every 6 (six) hours as needed for wheezing or shortness of breath., Disp: 8 g, Rfl: 6   alendronate (FOSAMAX) 70 MG tablet, Take 70 mg by mouth once a week., Disp: , Rfl:    aspirin EC 81 MG tablet, Take 81 mg daily by mouth. , Disp: , Rfl:    BREZTRI AEROSPHERE 160-9-4.8 MCG/ACT AERO, INHALE 2 PUFFS IN THE MORNING AND AT BEDTIME, Disp: 11 g, Rfl: 0   feeding supplement (ENSURE ENLIVE / ENSURE PLUS) LIQD, Take 237 mLs by mouth 2 (two) times daily between meals., Disp: 237 mL, Rfl: 12   hydroxyurea (HYDREA) 500 MG capsule, TAKE 1 CAPSULE BY MOUTH EVERY OTHER DAY (MAY  TAKE  WITH  FOOD  TO  MINIMIZE  GI  SIDE  EFFECTS), Disp: 15 capsule, Rfl: 0   levothyroxine (SYNTHROID) 75 MCG tablet, Take 75 mcg by mouth every morning., Disp: , Rfl:    metoprolol succinate (TOPROL-XL) 25 MG 24 hr tablet, Take 25 mg by mouth daily., Disp: , Rfl:    OXYGEN, Inhale 2 L into the lungs continuous., Disp: , Rfl:    predniSONE (DELTASONE) 5 MG tablet, Take 1 tablet (  5 mg total) by mouth daily with breakfast., Disp: 30 tablet, Rfl: 5   roflumilast (DALIRESP) 500 MCG TABS tablet, Take 1 tablet by mouth once daily, Disp: 30 tablet, Rfl: 11   rosuvastatin (CRESTOR) 40 MG tablet, Take 1 tablet (40 mg total) by mouth daily., Disp: 90 tablet, Rfl: 3      Objective:   Vitals:    06/26/21 1323  BP: 126/78  Pulse: 85  Temp: 97.7 F (36.5 C)  TempSrc: Oral  SpO2: 97%  Weight: 98 lb 9.6 oz (44.7 kg)  Height: 5\' 4"  (1.626 m)    Estimated body mass index is 16.92 kg/m as calculated from the following:   Height as of this encounter: 5\' 4"  (1.626 m).   Weight as of this encounter: 98 lb 9.6 oz (44.7 kg).  @WEIGHTCHANGE @  [employer]   06/26/21 1323  Weight: 98 lb 9.6 oz (44.7 kg)     Physical Exam  General: No distress. On o2. Looks well Neuro: Alert and Oriented x 3. GCS 15. Speech normal Psych: Pleasant Resp:  Barrel Chest - barrell.  Wheeze - no, Crackles - no, No overt respiratory distress CVS: Normal heart sounds. Murmurs - no Ext: Stigmata of Connective Tissue Disease - no HEENT: Normal upper airway. PEERL +. No post nasal drip        Assessment:       ICD-10-CM   1. Pulmonary nodules  R91.8 NM PET Image Restage (PS) Skull Base to Thigh (F-18 FDG)    CANCELED: NM PET Image Initial (PI) Skull Base To Thigh    2. Stage 3 severe COPD by GOLD classification [facility])  J44.9 Pulmonary function test    Pulmonary function test         Plan:     Patient Instructions  Pulmonary nodules  - worsening or RML/RUL nodule since Dec 2021 -> Aug 2022 -> Oct 2022. Concerning for early stage lung cancer. Other nodules from Aug 2022 still perisst OCt 2022 and are probably not cancer  Plan  - PET scan next few weeks - see Icard/Byrum after that   Stage 3 severe COPD by GOLD classification ([place]) Chronic resp failure  - stable disease but prior ABG including July 2022 with high CO2  Plan - check abg -> if CO2 high might need night bipap - do spiro/dlco asap -Continue oxygen 2 L nasal cannula continuous -Continue BREZTRI 2 puff 2 times daily  -Use albuterol as needed for emergency rescue [understand that inhaler is not helping you but nebulizer can] -Continue Roflumilast daily -Cotninue chronic prednisone daily 5 mg/day  Follow-up -Icard/Byrum  after PET scan  next few weeks (ideally get PFT/ABG before seeing them but not required) - Ajeenah Heiny I 3 months    SIGNATURE    Dr. Brand Males, M.D., F.C.C.P,  Pulmonary and Critical Care Medicine Staff Physician, [facility] Director - Interstitial Lung Disease  Program  Pulmonary [place] at [place], [place], [address]  Pager: [phone], If no answer or between  15:00h - 7:00h: call [phone] Telephone: [phone]  10:55 PM 06/26/2021

## 2021-07-01 ENCOUNTER — Ambulatory Visit (HOSPITAL_COMMUNITY)
Admission: RE | Admit: 2021-07-01 | Discharge: 2021-07-01 | Disposition: A | Discharge status: Home / Self Care | Payer: Medicare Other | Source: Ambulatory Visit | Attending: Internal Medicine | Admitting: Internal Medicine

## 2021-07-01 ENCOUNTER — Other Ambulatory Visit: Payer: Self-pay

## 2021-07-01 DIAGNOSIS — J449 Chronic obstructive pulmonary disease, unspecified: Secondary | ICD-10-CM | POA: Diagnosis present

## 2021-07-01 LAB — BLOOD GAS, ARTERIAL
Acid-Base Excess: 2.8 mmol/L — ABNORMAL HIGH (ref 0.0–2.0)
Bicarbonate: 26.8 mmol/L (ref 20.0–28.0)
FIO2: 28
O2 Saturation: 98.1 %
Patient temperature: 37
pCO2 arterial: 41.3 mmHg (ref 32.0–48.0)
pH, Arterial: 7.429 (ref 7.350–7.450)
pO2, Arterial: 98.2 mmHg (ref 83.0–108.0)

## 2021-07-02 ENCOUNTER — Other Ambulatory Visit: Payer: Self-pay | Admitting: Internal Medicine

## 2021-07-02 ENCOUNTER — Other Ambulatory Visit: Payer: Self-pay | Admitting: Hematology & Oncology

## 2021-07-02 DIAGNOSIS — D649 Anemia, unspecified: Secondary | ICD-10-CM

## 2021-07-02 NOTE — Telephone Encounter (Signed)
Received Rx request for Hydroxyurea 500 mg to take 1 tablet EOD. Last refilled 06/02/21 #15. Last OV 03/26/21 and next scheduled 09/25/21. Please advise if ok to refill, thank you!

## 2021-07-03 ENCOUNTER — Ambulatory Visit (HOSPITAL_COMMUNITY): Payer: Medicare Other

## 2021-07-09 ENCOUNTER — Encounter (HOSPITAL_COMMUNITY)
Admission: RE | Admit: 2021-07-09 | Discharge: 2021-07-09 | Disposition: A | Discharge status: Home / Self Care | Payer: Medicare Other | Source: Ambulatory Visit | Attending: Internal Medicine | Admitting: Internal Medicine

## 2021-07-09 ENCOUNTER — Other Ambulatory Visit (HOSPITAL_COMMUNITY): Payer: Self-pay

## 2021-07-09 ENCOUNTER — Ambulatory Visit (HOSPITAL_COMMUNITY): Payer: Medicare Other

## 2021-07-09 DIAGNOSIS — R918 Other nonspecific abnormal finding of lung field: Secondary | ICD-10-CM | POA: Diagnosis not present

## 2021-07-09 LAB — GLUCOSE, CAPILLARY: Glucose-Capillary: 102 mg/dL — ABNORMAL HIGH (ref 70–99)

## 2021-07-09 MED ORDER — FLUDEOXYGLUCOSE F - 18 (FDG) INJECTION
5.0000 | Freq: Once | INTRAVENOUS | Status: AC | PRN
  Administered 2021-07-09: 5 via INTRAVENOUS

## 2021-07-10 NOTE — Progress Notes (Signed)
IMPRESSION: 1. Moderate metabolic activity associated with irregular nodules clustered in the RIGHT upper lobe. Favor post inflammatory or infectious process. Cannot exclude neoplasm. Recommend tissue sampling versus CT surveillance. 2. Persistent but decreased activity in LEFT upper lobe parenchymal lesion.   Electronically Signed   By: Suzy Bouchard M.D.   On: 07/09/2021 16:18  Keept appt with Dr Valeta Harms

## 2021-07-11 ENCOUNTER — Ambulatory Visit (HOSPITAL_COMMUNITY): Payer: Medicare Other

## 2021-07-12 ENCOUNTER — Telehealth: Payer: Self-pay | Admitting: Internal Medicine

## 2021-07-12 NOTE — Telephone Encounter (Signed)
On 03/05/21 MR ordered Chest CT without contrast to be done in December.  PT had a Chest CT without on 10/24 ordered by TP.  Does Chest CT still need to be done in December?

## 2021-07-12 NOTE — Telephone Encounter (Signed)
No, since pt just had CT performed, the December one can be cancelled. I will cancel order.

## 2021-07-16 ENCOUNTER — Encounter: Payer: Self-pay | Admitting: Cardiology

## 2021-07-16 ENCOUNTER — Ambulatory Visit (INDEPENDENT_AMBULATORY_CARE_PROVIDER_SITE_OTHER): Payer: Medicare Other | Admitting: Cardiology

## 2021-07-16 ENCOUNTER — Other Ambulatory Visit: Payer: Self-pay

## 2021-07-16 VITALS — BP 140/80 | HR 80 | Ht 64.0 in | Wt 97.2 lb

## 2021-07-16 DIAGNOSIS — I10 Essential (primary) hypertension: Secondary | ICD-10-CM | POA: Diagnosis not present

## 2021-07-16 DIAGNOSIS — I700 Atherosclerosis of aorta: Secondary | ICD-10-CM

## 2021-07-16 DIAGNOSIS — R931 Abnormal findings on diagnostic imaging of heart and coronary circulation: Secondary | ICD-10-CM | POA: Insufficient documentation

## 2021-07-16 DIAGNOSIS — E7800 Pure hypercholesterolemia, unspecified: Secondary | ICD-10-CM

## 2021-07-16 DIAGNOSIS — R002 Palpitations: Secondary | ICD-10-CM

## 2021-07-16 LAB — LIPID PANEL
Chol/HDL Ratio: 2.6 ratio (ref 0.0–4.4)
Cholesterol, Total: 148 mg/dL (ref 100–199)
HDL: 57 mg/dL (ref >39)
LDL Chol Calc (NIH): 79 mg/dL (ref 0–99)
Triglycerides: 58 mg/dL (ref 0–149)
VLDL Cholesterol Cal: 12 mg/dL (ref 5–40)

## 2021-07-16 LAB — ALT: ALT: 32 IU/L (ref 0–32)

## 2021-07-16 MED ORDER — ROSUVASTATIN CALCIUM 40 MG PO TABS: 40.0000 mg | ORAL_TABLET | Freq: Every day | ORAL | 3 refills | Status: DC

## 2021-07-16 MED ORDER — METOPROLOL SUCCINATE ER 25 MG PO TB24: 25.0000 mg | ORAL_TABLET | Freq: Every day | ORAL | 3 refills | Status: DC

## 2021-07-16 NOTE — Addendum Note (Signed)
Addended by: Antonieta Iba on: 07/16/2021 11:13 AM   Modules accepted: Orders

## 2021-07-16 NOTE — Progress Notes (Signed)
Cardiology Office Consult Note    Date:  07/16/2021   ID:  Cheryl Haley, DOB 12-07-1955, MRN [id number]  PCP:  [facility] [place]  Cardiologist:  [name], MD   Chief Complaint  Patient presents with   Follow-up    Coronary artery calcifications, hypertension, hyperlipidemia     History of Present Illness:  Clora Haley is a 65 y.o. female with a hx of HTN, COPD, HLD, potation's and tobacco abuse palpitations.  Event monitor was norma.  She has chronic DOE related to COPD and is on O2.  She was noted to have coronary calcifications in all 3 coronaries by screening Chest CT for hx of tobacco last fall.  She underwent coronary Ca score in Feb 2021 which was high at 678 and placed her in the 97th% for age and sex matched controls and was also found to have aortic atherosclerosis.    She is here today for followup and is doing well.  She denies any chest pain or pressure, PND, orthopnea, LE edema, dizziness, palpitations or syncope. She has chronic DOE on O2 at 2L 24/7 and SOB is stable.  She is compliant with her meds and is tolerating meds with no SE.      Past Medical History:  Diagnosis Date   Anxiety    Arthritis    "in neck"   COPD (chronic obstructive pulmonary disease) ([place])    Emphysema lung ([place])    Essential thrombocythemia ([place]) 09/15/2019   History of hiatal hernia    Hx of TIA (transient ischemic attack) and stroke    Hyperlipidemia    Hypertension    Hypothyroidism    Impaired fasting glucose    Pleural effusion 05/15/2014   CXR 05/2014:  New small right effusion.     Pneumonia    Protein-calorie malnutrition, severe 09/17/2018   Pulmonary nodules 07/14/2018   07/2017 CT chest >pulmonary nodules noted.  07/2017 PET scan -Hypermetabolic LUL nodule  12/5362 CT chest >decreased nodule size, new pulmonary nodules noted.  FOB -atypical cells  CT chest 07/2018 >stable nodules to smaller on established nodules . 2 new nodules RUL , RLL >CT  chest 3 months    Thyroid disease    Tobacco abuse 11/28/2015   Smoked 2 packs of cigarettes daily from teenage years until age 28, quit in 2016    Unspecified hypothyroidism 09/20/2013   Viral respiratory infection 09/16/2018    Past Surgical History:  Procedure Laterality Date   ESOPHAGOGASTRODUODENOSCOPY     VIDEO BRONCHOSCOPY WITH ENDOBRONCHIAL NAVIGATION Left 07/29/2017   Procedure: VIDEO BRONCHOSCOPY WITH ENDOBRONCHIAL NAVIGATION;  Surgeon: Collene Gobble, MD;  Location: [facility] OR;  Service: Thoracic;  Laterality: Left;    Current Medications: Current Meds  Medication Sig   albuterol (VENTOLIN HFA) 108 (90 Base) MCG/ACT inhaler Inhale 2 puffs into the lungs every 6 (six) hours as needed for wheezing or shortness of breath.   alendronate (FOSAMAX) 70 MG tablet Take 70 mg by mouth once a week.   aspirin EC 81 MG tablet Take 81 mg daily by mouth.    BREZTRI AEROSPHERE 160-9-4.8 MCG/ACT AERO INHALE 2 PUFFS IN THE MORNING AND AT BEDTIME   feeding supplement (ENSURE ENLIVE / ENSURE PLUS) LIQD Take 237 mLs by mouth 2 (two) times daily between meals.   hydroxyurea (HYDREA) 500 MG capsule TAKE 1 CAPSULE BY MOUTH EVERY OTHER DAY (MAY  TAKE  WITH  FOOD  TO  MINIMIZE  GI  SIDE  EFFECTS)  levothyroxine (SYNTHROID) 75 MCG tablet Take 75 mcg by mouth every morning.   metoprolol succinate (TOPROL-XL) 25 MG 24 hr tablet Take 25 mg by mouth daily.   OXYGEN Inhale 2 L into the lungs continuous.   predniSONE (DELTASONE) 5 MG tablet Take 1 tablet (5 mg total) by mouth daily with breakfast.   roflumilast (DALIRESP) 500 MCG TABS tablet Take 1 tablet by mouth once daily   rosuvastatin (CRESTOR) 40 MG tablet Take 1 tablet (40 mg total) by mouth daily.    Allergies:   Patient has no known allergies.   Social History   Socioeconomic History   Marital status: Divorced    Spouse name: Not on file   Number of children: Not on file   Years of education: Not on file   Highest education level: Not on file   Occupational History   Not on file  Tobacco Use   Smoking status: Former    Packs/day: 2.00    Years: 40.00    Pack years: 80.00    Types: Cigarettes    Quit date: 03/16/2017    Years since quitting: 4.3   Smokeless tobacco: Never   Tobacco comments:       Vaping Use   Vaping Use: Never used  Substance and Sexual Activity   Alcohol use: Not Currently   Drug use: No   Sexual activity: Not on file  Other Topics Concern   Not on file  Social History Narrative   Not on file   Social Determinants of Health   Financial Resource Strain: Not on file  Food Insecurity: Not on file  Transportation Needs: Not on file  Physical Activity: Not on file  Stress: Not on file  Social Connections: Not on file     Family History:  The patient's family history includes Cancer in her sister and sister; Congestive Heart Failure in her sister and sister; Emphysema in her brother, father, sister, and sister; Heart disease in her brother, brother, father, mother, and sister.   ROS:   Please see the history of present illness.    ROS All other systems reviewed and are negative.  No flowsheet data found.   PHYSICAL EXAM:   VS:  BP 140/80   Pulse 80   Ht 5\' 4"  (1.626 m)   Wt 97 lb 3.2 oz (44.1 kg)   SpO2 96%   BMI 16.68 kg/m     GEN: Well nourished, well developed in no acute distress HEENT: Normal NECK: No JVD; No carotid bruits LYMPHATICS: No lymphadenopathy CARDIAC:RRR, no murmurs, rubs, gallops RESPIRATORY:  Clear to auscultation without rales, wheezing or rhonchi  ABDOMEN: Soft, non-tender, non-distended MUSCULOSKELETAL:  No edema; No deformity  SKIN: Warm and dry NEUROLOGIC:  Alert and oriented x 3 PSYCHIATRIC:  Normal affect    Wt Readings from Last 3 Encounters:  07/16/21 97 lb 3.2 oz (44.1 kg)  06/26/21 98 lb 9.6 oz (44.7 kg)  05/10/21 94 lb 12.8 oz (43 kg)      Studies/Labs Reviewed:   EKG:  EKG is not ordered today.    Recent Labs: 08/22/2020: TSH  0.372 04/01/2021: Magnesium 2.0 04/30/2021: ALT 31; BUN 16; Creatinine, Ser 0.61; Hemoglobin 12.0; Platelets 408; Potassium 4.1; Sodium 135   Lipid Panel    Component Value Date/Time   CHOL 144 07/12/2020 0914   TRIG 67 07/12/2020 0914   HDL 56 07/12/2020 0914   CHOLHDL 2.6 07/12/2020 0914   LDLCALC 74 07/12/2020 0914    Additional studies/  records that were reviewed today include:  Coronary Ca score, Lexiscan myoview, 2D echo, event monitor  ASSESSMENT:    1. Agatston coronary artery calcium score greater than 400   2. Primary hypertension   3. Pure hypercholesterolemia   4. Aortic atherosclerosis (HCC)   5. Palpitations      PLAN:  In order of problems listed above:  1.  Coronary Artery Calcifications -noted on chest CT with Ca score 678 -she has chronic DOE related to COPD that is stable -Lexiscan myoview in Feb 21 showed no ischemia -She denies any anginal symptoms since I saw her last -Continue aggressive risk factor modification -continue statin and ASA 81mg  daily  2.  HTN -BP controlled on exam today -Continue prescription drug management with Toprol-XL 25 mg daily with as needed refills  3.  HLD -LDL goal is less than 70 given coronary artery calcifications and aortic atherosclerosis  -Check FLP and ALT -Drug management with Crestor 40 mg daily with as needed refills  4.  Aortic Atherosclerosis -continue statin -needs aggressive BP control  5.  Palpitations -event monitor showed no arrhythmias -she has not had any further palpitations   Medication Adjustments/Labs and Tests Ordered: Current medicines are reviewed at length with the patient today.  Concerns regarding medicines are outlined above.  Medication changes, Labs and Tests ordered today are listed in the Patient Instructions below.  There are no Patient Instructions on file for this visit.   Signed, [name], MD  07/16/2021 10:58 AM    [facility] [place], [place], [place]  [address] Phone: [phone]; Fax: [phone]

## 2021-07-16 NOTE — Patient Instructions (Signed)
Medication Instructions:  Your physician recommends that you continue on your current medications as directed. Please refer to the Current Medication list given to you today.  *If you need a refill on your cardiac medications before your next appointment, please call your pharmacy*  Lab Work: TODAY: FLP and ALT If you have labs (blood work) drawn today and your tests are completely normal, you will receive your results only by: [place] (if you have MyChart) OR A paper copy in the mail If you have any lab test that is abnormal or we need to change your treatment, we will call you to review the results.  Follow-Up: At [facility], you and your health needs are our priority.  As part of our continuing mission to provide you with exceptional heart care, we have created designated Provider Care Teams.  These Care Teams include your primary Cardiologist (physician) and Advanced Practice Providers (APPs -  Physician Assistants and Nurse Practitioners) who all work together to provide you with the care you need, when you need it.  Your next appointment:   1 year(s)  The format for your next appointment:   In Person  Provider:   [name], MD

## 2021-07-18 ENCOUNTER — Telehealth: Payer: Self-pay | Admitting: Cardiology

## 2021-07-18 ENCOUNTER — Encounter: Payer: Self-pay | Admitting: Pulmonary Disease

## 2021-07-18 ENCOUNTER — Ambulatory Visit (INDEPENDENT_AMBULATORY_CARE_PROVIDER_SITE_OTHER): Payer: Medicare Other | Admitting: Pulmonary Disease

## 2021-07-18 ENCOUNTER — Ambulatory Visit: Payer: Medicare Other | Admitting: Pulmonary Disease

## 2021-07-18 ENCOUNTER — Other Ambulatory Visit: Payer: Self-pay

## 2021-07-18 ENCOUNTER — Ambulatory Visit (HOSPITAL_COMMUNITY): Payer: Medicare Other

## 2021-07-18 ENCOUNTER — Telehealth: Payer: Self-pay | Admitting: Pulmonary Disease

## 2021-07-18 VITALS — BP 124/60 | HR 73 | Temp 97.9°F | Ht 64.0 in | Wt 98.0 lb

## 2021-07-18 DIAGNOSIS — J432 Centrilobular emphysema: Secondary | ICD-10-CM | POA: Diagnosis not present

## 2021-07-18 DIAGNOSIS — J9611 Chronic respiratory failure with hypoxia: Secondary | ICD-10-CM

## 2021-07-18 DIAGNOSIS — E7800 Pure hypercholesterolemia, unspecified: Secondary | ICD-10-CM

## 2021-07-18 DIAGNOSIS — R918 Other nonspecific abnormal finding of lung field: Secondary | ICD-10-CM

## 2021-07-18 MED ORDER — EZETIMIBE 10 MG PO TABS: 10.0000 mg | ORAL_TABLET | Freq: Every day | ORAL | 3 refills | Status: DC

## 2021-07-18 NOTE — Telephone Encounter (Signed)
Pt is returning call regarding recent test results.

## 2021-07-18 NOTE — Telephone Encounter (Signed)
This was sched for 12/20 at 7:30.  Pt will go for covid test on 12/16.  CT sched for 12/12 at [facility].  I asked them to have noted to send disk to [facility].  Left vm for pt to call me back for appt info.

## 2021-07-18 NOTE — Progress Notes (Signed)
Synopsis: Referred in November 2022 for lung nodule by [facility]  Subjective:   PATIENT ID: Cheryl Haley GENDER: female DOB: 06-14-1956, MRN: [id number]  Chief Complaint  Patient presents with   Follow-up    This is a 65 year old female, past medical history of significant emphysema, hypertension, hyperlipidemia, former tobacco abuse, quit in 2016.  In 2018 she had a left upper lobe PET avid nodule that was taken for navigational bronchoscopy by Dr. Lamonte Sakai.  This was nondiagnostic and subsequent CT imaging revealed resolution of this left upper lobe nodule.  She is been followed now by some time from Dr. Chase Caller in his clinic.  Ultimately patient had additional CT imaging done which showed a new left lung nodule.  Also has left upper lobe posterior nodules that look more inflammatory.  Patient ultimately had a nuclear medicine PET scan done which showed low-level uptake within both of these lesions.  Patient is very anxious about this due to the family history of malignancy and lung cancer.  Today in the office we reviewed her CT images and discuss plans.   Past Medical History:  Diagnosis Date   Agatston coronary artery calcium score greater than 400    score 678   Anxiety    Arthritis    "in neck"   COPD (chronic obstructive pulmonary disease) (HCC)    Emphysema lung (HCC)    Essential thrombocythemia ([place]) 09/15/2019   History of hiatal hernia    Hx of TIA (transient ischemic attack) and stroke    Hyperlipidemia    Hypertension    Hypothyroidism    Impaired fasting glucose    Pleural effusion 05/15/2014   CXR 05/2014:  New small right effusion.     Pneumonia    Protein-calorie malnutrition, severe 09/17/2018   Pulmonary nodules 07/14/2018   07/2017 CT chest >pulmonary nodules noted.  07/2017 PET scan -Hypermetabolic LUL nodule  12/1023 CT chest >decreased nodule size, new pulmonary nodules noted.  FOB -atypical cells  CT chest 07/2018 >stable nodules to  smaller on established nodules . 2 new nodules RUL , RLL >CT chest 3 months    Thyroid disease    Tobacco abuse 11/28/2015   Smoked 2 packs of cigarettes daily from teenage years until age 59, quit in 2016    Unspecified hypothyroidism 09/20/2013   Viral respiratory infection 09/16/2018     Family History  Problem Relation Age of Onset   Emphysema Father    Heart disease Father    Emphysema Brother    Emphysema Sister    Emphysema Sister    Heart disease Mother    Heart disease Sister    Heart disease Brother    Heart disease Brother    Congestive Heart Failure Sister    Congestive Heart Failure Sister    Cancer Sister        lung   Cancer Sister        breast     Past Surgical History:  Procedure Laterality Date   ESOPHAGOGASTRODUODENOSCOPY     VIDEO BRONCHOSCOPY WITH ENDOBRONCHIAL NAVIGATION Left 07/29/2017   Procedure: VIDEO BRONCHOSCOPY WITH ENDOBRONCHIAL NAVIGATION;  Surgeon: Collene Gobble, MD;  Location: [facility] OR;  Service: Thoracic;  Laterality: Left;    Social History   Socioeconomic History   Marital status: Divorced    Spouse name: Not on file   Number of children: Not on file   Years of education: Not on file   Highest education level: Not on file  Occupational History   Not on file  Tobacco Use   Smoking status: Former    Packs/day: 2.00    Years: 40.00    Pack years: 80.00    Types: Cigarettes    Quit date: 03/16/2017    Years since quitting: 4.3   Smokeless tobacco: Never   Tobacco comments:       Vaping Use   Vaping Use: Never used  Substance and Sexual Activity   Alcohol use: Not Currently   Drug use: No   Sexual activity: Not on file  Other Topics Concern   Not on file  Social History Narrative   Not on file   Social Determinants of Health   Financial Resource Strain: Not on file  Food Insecurity: Not on file  Transportation Needs: Not on file  Physical Activity: Not on file  Stress: Not on file  Social Connections: Not on file   Intimate Partner Violence: Not on file     No Known Allergies   Outpatient Medications Prior to Visit  Medication Sig Dispense Refill   albuterol (VENTOLIN HFA) 108 (90 Base) MCG/ACT inhaler Inhale 2 puffs into the lungs every 6 (six) hours as needed for wheezing or shortness of breath. 8 g 6   alendronate (FOSAMAX) 70 MG tablet Take 70 mg by mouth once a week.     aspirin EC 81 MG tablet Take 81 mg daily by mouth.      BREZTRI AEROSPHERE 160-9-4.8 MCG/ACT AERO INHALE 2 PUFFS IN THE MORNING AND AT BEDTIME 11 g 6   feeding supplement (ENSURE ENLIVE / ENSURE PLUS) LIQD Take 237 mLs by mouth 2 (two) times daily between meals. 237 mL 12   hydroxyurea (HYDREA) 500 MG capsule TAKE 1 CAPSULE BY MOUTH EVERY OTHER DAY (MAY  TAKE  WITH  FOOD  TO  MINIMIZE  GI  SIDE  EFFECTS) 15 capsule 2   levothyroxine (SYNTHROID) 75 MCG tablet Take 75 mcg by mouth every morning.     metoprolol succinate (TOPROL-XL) 25 MG 24 hr tablet Take 1 tablet (25 mg total) by mouth daily. 90 tablet 3   OXYGEN Inhale 2 L into the lungs continuous.     predniSONE (DELTASONE) 5 MG tablet Take 1 tablet (5 mg total) by mouth daily with breakfast. 30 tablet 5   roflumilast (DALIRESP) 500 MCG TABS tablet Take 1 tablet by mouth once daily 30 tablet 11   rosuvastatin (CRESTOR) 40 MG tablet Take 1 tablet (40 mg total) by mouth daily. 90 tablet 3   No facility-administered medications prior to visit.    Review of Systems  Constitutional:  Negative for chills, fever, malaise/fatigue and weight loss.  HENT:  Negative for hearing loss, sore throat and tinnitus.   Eyes:  Negative for blurred vision and double vision.  Respiratory:  Positive for shortness of breath. Negative for cough, hemoptysis, sputum production, wheezing and stridor.   Cardiovascular:  Negative for chest pain, palpitations, orthopnea, leg swelling and PND.  Gastrointestinal:  Negative for abdominal pain, constipation, diarrhea, heartburn, nausea and vomiting.   Genitourinary:  Negative for dysuria, hematuria and urgency.  Musculoskeletal:  Negative for joint pain and myalgias.  Skin:  Negative for itching and rash.  Neurological:  Negative for dizziness, tingling, weakness and headaches.  Endo/Heme/Allergies:  Negative for environmental allergies. Does not bruise/bleed easily.  Psychiatric/Behavioral:  Negative for depression. The patient is not nervous/anxious and does not have insomnia.   All other systems reviewed and are negative.   Objective:  Physical Exam Vitals reviewed.  Constitutional:      General: She is not in acute distress.    Appearance: She is well-developed.  HENT:     Head: Normocephalic and atraumatic.     Mouth/Throat:     Pharynx: No oropharyngeal exudate.  Eyes:     Conjunctiva/sclera: Conjunctivae normal.     Pupils: Pupils are equal, round, and reactive to light.  Neck:     Vascular: No JVD.     Trachea: No tracheal deviation.     Comments: Loss of supraclavicular fat Cardiovascular:     Rate and Rhythm: Normal rate and regular rhythm.     Heart sounds: S1 normal and S2 normal.     Comments: Distant heart tones Pulmonary:     Effort: No tachypnea or accessory muscle usage.     Breath sounds: No stridor. Decreased breath sounds (throughout all lung fields) present. No wheezing, rhonchi or rales.     Comments: Diminished breath sounds bilaterally Abdominal:     General: Bowel sounds are normal. There is no distension.     Palpations: Abdomen is soft.     Tenderness: There is no abdominal tenderness.  Musculoskeletal:        General: Deformity (muscle wasting ) present.  Skin:    General: Skin is warm and dry.     Capillary Refill: Capillary refill takes less than 2 seconds.     Findings: No rash.  Neurological:     Mental Status: She is alert and oriented to person, place, and time.  Psychiatric:        Behavior: Behavior normal.     Vitals:   07/18/21 1001  BP: 124/60  Pulse: 73  Temp: 97.9 F  (36.6 C)  TempSrc: Oral  SpO2: 97%  Weight: 98 lb (44.5 kg)  Height: 5\' 4"  (1.626 m)   97% on RA BMI Readings from Last 3 Encounters:  07/18/21 16.82 kg/m  07/16/21 16.68 kg/m  06/26/21 16.92 kg/m   Wt Readings from Last 3 Encounters:  07/18/21 98 lb (44.5 kg)  07/16/21 97 lb 3.2 oz (44.1 kg)  06/26/21 98 lb 9.6 oz (44.7 kg)     CBC    Component Value Date/Time   WBC 7.6 04/30/2021 1423   RBC 3.83 (L) 04/30/2021 1423   HGB 12.0 04/30/2021 1423   HGB 12.3 03/26/2021 1005   HCT 36.4 04/30/2021 1423   PLT 408 (H) 04/30/2021 1423   PLT 378 03/26/2021 1005   MCV 95.0 04/30/2021 1423   MCH 31.3 04/30/2021 1423   MCHC 33.0 04/30/2021 1423   RDW 17.3 (H) 04/30/2021 1423   LYMPHSABS 0.5 (L) 04/30/2021 1423   MONOABS 0.5 04/30/2021 1423   EOSABS 0.0 04/30/2021 1423   BASOSABS 0.0 04/30/2021 1423    Chest Imaging: June 24, 2021: CT chest: Upper lobe pulmonary nodule on the left side, posterior lesions to the look more inflammatory in nature. This is new nodule however compared to her imaging that was done last year. The patient's images have been independently reviewed by me.    Pulmonary Functions Testing Results: No flowsheet data found.  FeNO:   Pathology:   Echocardiogram:   Heart Catheterization:     Assessment & Plan:     ICD-10-CM   1. Pulmonary nodules  R91.8 CT Super D Chest Wo Contrast    Procedural/ Surgical Case Request: ROBOTIC ASSISTED NAVIGATIONAL BRONCHOSCOPY    Ambulatory referral to Pulmonology    2. Chronic hypoxemic respiratory failure (  [place])  J96.11     3. Centrilobular emphysema ([place])  J43.2       Discussion:  This is a 65 year old female, history of pulmonary nodules, his family history of lung cancer, centrilobular emphysema, chronic hypoxemic respiratory failure and pulmonary nodules that are new on the left side.  Plan: Today in the office we reviewed her CT imaging we discussed the risk benefits and alternatives of  consideration for biopsy. She has had a previous biopsy that was nondiagnostic with a nodule that resolved. I think she should have at least some short-term follow-up CT imaging to help [place] plan whether or not we need to go after all 3 targets that are in the left side or just the more concerning one that is on the periphery. Patient is agreeable to that plan.  We will have a repeat super D CT scan in approximately 4 weeks from now. If the nodule dissipates or changes and looks more inflammatory then we will cancel her bronchoscopy if needed.  We will tentatively plan her bronchoscopy on 08/20/2021:    Current Outpatient Medications:    albuterol (VENTOLIN HFA) 108 (90 Base) MCG/ACT inhaler, Inhale 2 puffs into the lungs every 6 (six) hours as needed for wheezing or shortness of breath., Disp: 8 g, Rfl: 6   alendronate (FOSAMAX) 70 MG tablet, Take 70 mg by mouth once a week., Disp: , Rfl:    aspirin EC 81 MG tablet, Take 81 mg daily by mouth. , Disp: , Rfl:    BREZTRI AEROSPHERE 160-9-4.8 MCG/ACT AERO, INHALE 2 PUFFS IN THE MORNING AND AT BEDTIME, Disp: 11 g, Rfl: 6   feeding supplement (ENSURE ENLIVE / ENSURE PLUS) LIQD, Take 237 mLs by mouth 2 (two) times daily between meals., Disp: 237 mL, Rfl: 12   hydroxyurea (HYDREA) 500 MG capsule, TAKE 1 CAPSULE BY MOUTH EVERY OTHER DAY (MAY  TAKE  WITH  FOOD  TO  MINIMIZE  GI  SIDE  EFFECTS), Disp: 15 capsule, Rfl: 2   levothyroxine (SYNTHROID) 75 MCG tablet, Take 75 mcg by mouth every morning., Disp: , Rfl:    metoprolol succinate (TOPROL-XL) 25 MG 24 hr tablet, Take 1 tablet (25 mg total) by mouth daily., Disp: 90 tablet, Rfl: 3   OXYGEN, Inhale 2 L into the lungs continuous., Disp: , Rfl:    predniSONE (DELTASONE) 5 MG tablet, Take 1 tablet (5 mg total) by mouth daily with breakfast., Disp: 30 tablet, Rfl: 5   roflumilast (DALIRESP) 500 MCG TABS tablet, Take 1 tablet by mouth once daily, Disp: 30 tablet, Rfl: 11   rosuvastatin (CRESTOR) 40 MG  tablet, Take 1 tablet (40 mg total) by mouth daily., Disp: 90 tablet, Rfl: 3  I spent 42 minutes dedicated to the care of this patient on the date of this encounter to include pre-visit review of records, face-to-face time with the patient discussing conditions above, post visit ordering of testing, clinical documentation with the electronic health record, making appropriate referrals as documented, and communicating necessary findings to members of the patients care team.   Garner Nash, DO [facility] 07/18/2021 10:05 AM

## 2021-07-18 NOTE — Patient Instructions (Signed)
.  Thank you for visiting Dr. Valeta Harms at [facility]. Today we recommend the following: Orders Placed This Encounter  Procedures   Procedural/ Surgical Case Request: ROBOTIC ASSISTED NAVIGATIONAL BRONCHOSCOPY   CT Super D Chest Wo Contrast   Ambulatory referral to Pulmonology   Bronchoscopy on 08/20/2021  Return in about 6 weeks (around 08/27/2021) for w/ APP to review bronch results .    Please do your part to reduce the spread of COVID-19.

## 2021-07-18 NOTE — Progress Notes (Signed)
Pt had visit with Dr Valeta Harms today.

## 2021-07-18 NOTE — Telephone Encounter (Signed)
Cheryl Haley, RPH-CPP  07/17/2021 12:45 PM EST     Recommend adding ezetimibe 10mg  daily and continuing rosuvastatin 40mg  daily. Would recheck lipid panel and ALT in 8 weeks.  The patient has been notified of the result and verbalized understanding.  All questions (if any) were answered. Antonieta Iba, RN 07/18/2021 11:43 AM   Patient will start on zetia 10 mg daily. Repeat lab work has been scheduled.

## 2021-07-19 ENCOUNTER — Ambulatory Visit: Payer: Medicare Other | Admitting: Primary Care

## 2021-07-19 ENCOUNTER — Ambulatory Visit (INDEPENDENT_AMBULATORY_CARE_PROVIDER_SITE_OTHER): Payer: Medicare Other | Admitting: Pulmonary Disease

## 2021-07-19 DIAGNOSIS — J449 Chronic obstructive pulmonary disease, unspecified: Secondary | ICD-10-CM | POA: Diagnosis not present

## 2021-07-19 LAB — PULMONARY FUNCTION TEST
DL/VA % pred: 47 %
DL/VA: 2 ml/min/mmHg/L
DLCO cor % pred: 28 %
DLCO cor: 5.56 ml/min/mmHg
DLCO unc % pred: 28 %
DLCO unc: 5.56 ml/min/mmHg
FEF 25-75 Pre: 0.24 L/sec
FEF2575-%Pred-Pre: 11 %
FEV1-%Pred-Pre: 22 %
FEV1-Pre: 0.54 L
FEV1FVC-%Pred-Pre: 47 %
FEV6-%Pred-Pre: 47 %
FEV6-Pre: 1.4 L
FEV6FVC-%Pred-Pre: 98 %
FVC-%Pred-Pre: 47 %
FVC-Pre: 1.48 L
Pre FEV1/FVC ratio: 36 %
Pre FEV6/FVC Ratio: 95 %

## 2021-07-19 NOTE — Progress Notes (Signed)
PFT done today. 

## 2021-07-22 NOTE — Telephone Encounter (Signed)
Spoke to pt and gave her appt info.  

## 2021-07-23 ENCOUNTER — Ambulatory Visit (HOSPITAL_COMMUNITY): Payer: Medicare Other

## 2021-07-30 ENCOUNTER — Ambulatory Visit (HOSPITAL_COMMUNITY): Payer: Medicare Other

## 2021-08-01 ENCOUNTER — Ambulatory Visit (HOSPITAL_COMMUNITY): Payer: Medicare Other

## 2021-08-02 ENCOUNTER — Emergency Department (HOSPITAL_BASED_OUTPATIENT_CLINIC_OR_DEPARTMENT_OTHER): Payer: Medicare Other

## 2021-08-02 ENCOUNTER — Encounter (HOSPITAL_BASED_OUTPATIENT_CLINIC_OR_DEPARTMENT_OTHER): Payer: Self-pay

## 2021-08-02 ENCOUNTER — Other Ambulatory Visit: Payer: Self-pay

## 2021-08-02 ENCOUNTER — Emergency Department (HOSPITAL_BASED_OUTPATIENT_CLINIC_OR_DEPARTMENT_OTHER)
Admission: EM | Admit: 2021-08-02 | Discharge: 2021-08-02 | Disposition: A | Discharge status: Home / Self Care | Payer: Medicare Other | Attending: Emergency Medicine | Admitting: Emergency Medicine

## 2021-08-02 DIAGNOSIS — Z8616 Personal history of COVID-19: Secondary | ICD-10-CM | POA: Insufficient documentation

## 2021-08-02 DIAGNOSIS — Z87891 Personal history of nicotine dependence: Secondary | ICD-10-CM | POA: Insufficient documentation

## 2021-08-02 DIAGNOSIS — Z20822 Contact with and (suspected) exposure to covid-19: Secondary | ICD-10-CM | POA: Insufficient documentation

## 2021-08-02 DIAGNOSIS — I10 Essential (primary) hypertension: Secondary | ICD-10-CM | POA: Diagnosis not present

## 2021-08-02 DIAGNOSIS — J069 Acute upper respiratory infection, unspecified: Secondary | ICD-10-CM

## 2021-08-02 DIAGNOSIS — Z79899 Other long term (current) drug therapy: Secondary | ICD-10-CM | POA: Diagnosis not present

## 2021-08-02 DIAGNOSIS — E039 Hypothyroidism, unspecified: Secondary | ICD-10-CM | POA: Diagnosis not present

## 2021-08-02 DIAGNOSIS — Z7982 Long term (current) use of aspirin: Secondary | ICD-10-CM | POA: Diagnosis not present

## 2021-08-02 DIAGNOSIS — J449 Chronic obstructive pulmonary disease, unspecified: Secondary | ICD-10-CM | POA: Diagnosis not present

## 2021-08-02 DIAGNOSIS — R0602 Shortness of breath: Secondary | ICD-10-CM

## 2021-08-02 DIAGNOSIS — R059 Cough, unspecified: Secondary | ICD-10-CM | POA: Diagnosis present

## 2021-08-02 LAB — CBC
HCT: 39.8 % (ref 36.0–46.0)
Hemoglobin: 13.1 g/dL (ref 12.0–15.0)
MCH: 30.9 pg (ref 26.0–34.0)
MCHC: 32.9 g/dL (ref 30.0–36.0)
MCV: 93.9 fL (ref 80.0–100.0)
Platelets: 370 10*3/uL (ref 150–400)
RBC: 4.24 MIL/uL (ref 3.87–5.11)
RDW: 14.4 % (ref 11.5–15.5)
WBC: 6.7 10*3/uL (ref 4.0–10.5)
nRBC: 0 % (ref 0.0–0.2)

## 2021-08-02 LAB — BASIC METABOLIC PANEL
Anion gap: 10 (ref 5–15)
BUN: 14 mg/dL (ref 8–23)
CO2: 27 mmol/L (ref 22–32)
Calcium: 8.7 mg/dL — ABNORMAL LOW (ref 8.9–10.3)
Chloride: 97 mmol/L — ABNORMAL LOW (ref 98–111)
Creatinine, Ser: 0.73 mg/dL (ref 0.44–1.00)
GFR, Estimated: >60 mL/min (ref >60)
Glucose, Bld: 114 mg/dL — ABNORMAL HIGH (ref 70–99)
Potassium: 4.1 mmol/L (ref 3.5–5.1)
Sodium: 134 mmol/L — ABNORMAL LOW (ref 135–145)

## 2021-08-02 LAB — RESP PANEL BY RT-PCR (FLU A&B, COVID) ARPGX2
Influenza A by PCR: NEGATIVE
Influenza B by PCR: NEGATIVE
SARS Coronavirus 2 by RT PCR: NEGATIVE

## 2021-08-02 MED ORDER — PREDNISONE 10 MG PO TABS: 20.0000 mg | ORAL_TABLET | Freq: Every day | ORAL | 0 refills | Status: DC

## 2021-08-02 MED ORDER — AZITHROMYCIN 250 MG PO TABS: 250.0000 mg | ORAL_TABLET | Freq: Every day | ORAL | 0 refills | Status: DC

## 2021-08-02 MED ORDER — PREDNISONE 20 MG PO TABS
40.0000 mg | ORAL_TABLET | Freq: Once | ORAL | Status: AC
  Administered 2021-08-02: 40 mg via ORAL
  Filled 2021-08-02: qty 2

## 2021-08-02 MED ORDER — ALBUTEROL SULFATE HFA 108 (90 BASE) MCG/ACT IN AERS: 2.0000 | INHALATION_SPRAY | RESPIRATORY_TRACT | Status: DC | PRN

## 2021-08-02 NOTE — ED Notes (Signed)
Seen before triage, RR 16, HR 98, SpO2 97% with 2LPM [place] at per home.

## 2021-08-02 NOTE — ED Triage Notes (Signed)
Pt reports productive cough x 2 days. Runny nose. Chronic o2 at 2 L/[place]. Denies pain

## 2021-08-02 NOTE — ED Provider Notes (Signed)
[name] Provider Note   CSN: [id number] Arrival date & time: 08/02/21  1045     History Chief Complaint  Patient presents with   Cough    Cheryl Haley is a 65 y.o. female.   Cough Associated symptoms: no chest pain and no rash   Patient with history of COPD.  On chronic oxygen at 2 L.  Has been feeling more short of breath over the last 2 days.  Has a cough with occasional green sputum production.  No sick contacts.  Has been more fatigued.  More difficulty walking.  No fevers.  No chest pain.    Past Medical History:  Diagnosis Date   Agatston coronary artery calcium score greater than 400    score 678   Anxiety    Arthritis    "in neck"   COPD (chronic obstructive pulmonary disease) (HCC)    Emphysema lung (HCC)    Essential thrombocythemia ([place]) 09/15/2019   History of hiatal hernia    Hx of TIA (transient ischemic attack) and stroke    Hyperlipidemia    Hypertension    Hypothyroidism    Impaired fasting glucose    Pleural effusion 05/15/2014   CXR 05/2014:  New small right effusion.     Pneumonia    Protein-calorie malnutrition, severe 09/17/2018   Pulmonary nodules 07/14/2018   07/2017 CT chest >pulmonary nodules noted.  07/2017 PET scan -Hypermetabolic LUL nodule  01/2693 CT chest >decreased nodule size, new pulmonary nodules noted.  FOB -atypical cells  CT chest 07/2018 >stable nodules to smaller on established nodules . 2 new nodules RUL , RLL >CT chest 3 months    Thyroid disease    Tobacco abuse 11/28/2015   Smoked 2 packs of cigarettes daily from teenage years until age 79, quit in 2016    Unspecified hypothyroidism 09/20/2013   Viral respiratory infection 09/16/2018    Patient Active Problem List   Diagnosis Date Noted   Agatston coronary artery calcium score greater than 400 07/16/2021   COVID 03/28/2021   Hx of TIA (transient ischemic attack) and stroke    Oral thrush 09/10/2020   Medication management  09/10/2020   Abnormal findings on diagnostic imaging of lung 09/10/2020   Healthcare maintenance 09/10/2020   Acute on chronic respiratory failure with hypoxia and hypercapnia (HCC)    Hyponatremia 08/21/2020   Hypokalemia 08/21/2020   Sepsis without septic shock ([place]) 08/21/2020   SIRS (systemic inflammatory response syndrome) ([place])    Essential thrombocythemia ([place]) 09/15/2019   Protein-calorie malnutrition, severe 09/17/2018   Viral respiratory infection 09/16/2018   COPD exacerbation ([place]) 09/15/2018   Pulmonary nodules 07/14/2018   Pulmonary nodule, left    Chronic respiratory failure with hypoxia ([place]) 06/10/2016   Tobacco abuse 11/28/2015   Pleural effusion 05/15/2014   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     Family History  Problem Relation Age of Onset   Emphysema Father    Heart disease Father    Emphysema Brother  Emphysema Sister    Emphysema Sister    Heart disease Mother    Heart disease Sister    Heart disease Brother    Heart disease Brother    Congestive Heart Failure Sister    Congestive Heart Failure Sister    Cancer Sister        lung   Cancer Sister        breast    Social History   Tobacco Use   Smoking status: Former    Packs/day: 2.00    Years: 40.00    Pack years: 80.00    Types: Cigarettes    Quit date: 03/16/2017    Years since quitting: 4.3   Smokeless tobacco: Never   Tobacco comments:       Vaping Use   Vaping Use: Never used   Substance Use Topics   Alcohol use: Not Currently   Drug use: No    Home Medications Prior to Admission medications   Medication Sig Start Date End Date Taking? Authorizing Provider  azithromycin (ZITHROMAX) 250 MG tablet Take 1 tablet (250 mg total) by mouth daily. Take first 2 tablets together, then 1 every day until finished. 08/02/21  Yes Davonna Belling, MD  predniSONE (DELTASONE) 10 MG tablet Take 2 tablets (20 mg total) by mouth daily. Take first dose on 12/3 08/02/21  Yes Davonna Belling, MD  albuterol (VENTOLIN HFA) 108 (90 Base) MCG/ACT inhaler Inhale 2 puffs into the lungs every 6 (six) hours as needed for wheezing or shortness of breath. 09/14/20   Lauraine Rinne, NP  alendronate (FOSAMAX) 70 MG tablet Take 70 mg by mouth once a week. 06/13/21   [provider]  aspirin EC 81 MG tablet Take 81 mg daily by mouth.     [provider]  BREZTRI AEROSPHERE 160-9-4.8 MCG/ACT AERO INHALE 2 PUFFS IN THE MORNING AND AT BEDTIME 07/03/21   Brand Males, MD  ezetimibe (ZETIA) 10 MG tablet Take 1 tablet (10 mg total) by mouth daily. 07/18/21   Sueanne Margarita, MD  feeding supplement (ENSURE ENLIVE / ENSURE PLUS) LIQD Take 237 mLs by mouth 2 (two) times daily between meals. 08/28/20   Debbe Odea, MD  hydroxyurea (HYDREA) 500 MG capsule TAKE 1 CAPSULE BY MOUTH EVERY OTHER DAY (MAY  TAKE  WITH  FOOD  TO  MINIMIZE  GI  SIDE  EFFECTS) 07/02/21   Volanda Napoleon, MD  levothyroxine (SYNTHROID) 75 MCG tablet Take 75 mcg by mouth every morning. 04/24/21   [provider]  metoprolol succinate (TOPROL-XL) 25 MG 24 hr tablet Take 1 tablet (25 mg total) by mouth daily. 07/16/21   Sueanne Margarita, MD  OXYGEN Inhale 2 L into the lungs continuous.    [provider]  roflumilast (DALIRESP) 500 MCG TABS tablet Take 1 tablet by mouth once daily 06/11/21   Brand Males, MD  rosuvastatin (CRESTOR) 40 MG tablet Take 1 tablet (40 mg total) by mouth daily. 07/16/21    Sueanne Margarita, MD    Allergies    Patient has no known allergies.  Review of Systems   Review of Systems  Constitutional:  Positive for appetite change and fatigue.  HENT:  Positive for congestion.   Respiratory:  Positive for cough and choking.   Cardiovascular:  Negative for chest pain.  Gastrointestinal:  Negative for abdominal pain.  Genitourinary:  Negative for dysuria.  Musculoskeletal:  Negative for gait problem.  Skin:  Negative for rash.  Neurological:  Negative  for weakness.   Physical Exam Updated Vital Signs BP (!) 104/55   Pulse 82   Temp 98.5 F (36.9 C) (Oral)   Resp (!) 24   Ht 5\' 4"  (1.626 m)   Wt 42.9 kg   SpO2 97%   BMI 16.24 kg/m   Physical Exam Vitals and nursing note reviewed.  HENT:     Head: Atraumatic.  Eyes:     Pupils: Pupils are equal, round, and reactive to light.  Cardiovascular:     Rate and Rhythm: Regular rhythm.  Pulmonary:     Comments: Mildly harsh breath sounds without focal rales or rhonchi.  On 2 L of oxygen. Abdominal:     Tenderness: There is no abdominal tenderness.  Musculoskeletal:        General: No tenderness.  Skin:    General: Skin is warm.     Capillary Refill: Capillary refill takes less than 2 seconds.  Neurological:     Mental Status: She is alert and oriented to person, place, and time.    ED Results / Procedures / Treatments   Labs (all labs ordered are listed, but only abnormal results are displayed) Labs Reviewed  BASIC METABOLIC PANEL - Abnormal; Notable for the following components:      Result Value   Sodium 134 (*)    Chloride 97 (*)    Glucose, Bld 114 (*)    Calcium 8.7 (*)    All other components within normal limits  RESP PANEL BY RT-PCR (FLU A&B, COVID) ARPGX2  CBC    EKG EKG Interpretation  Date/Time:  Friday August 02 2021 11:19:57 EST Ventricular Rate:  88 PR Interval:  137 QRS Duration: 103 QT Interval:  366 QTC Calculation: 443 R Axis:   98 Text Interpretation: Sinus  rhythm Biatrial enlargement Right axis deviation Borderline ST elevation, anterolateral leads T waves more prominent anterior laterally. Confirmed by Davonna Belling [phone]) on 08/02/2021 12:24:28 PM  Radiology DG Chest Port 1 View  Result Date: 08/02/2021 CLINICAL DATA:  Cough, flu like symptoms for 2 days. EXAM: PORTABLE CHEST 1 VIEW COMPARISON:  April 30, 2021. Also CT of the chest of October of 2022. FINDINGS: EKG leads project over the chest. Lungs remain hyperinflated. Stable appearance of cardiomediastinal contours and hilar structures. No lobar consolidation. No sign of pleural effusion. On limited assessment no acute skeletal process. IMPRESSION: Findings of COPD without acute cardiopulmonary disease. Electronically Signed   By: Zetta Bills M.D.   On: 08/02/2021 11:39    Procedures Procedures   Medications Ordered in ED Medications  albuterol (VENTOLIN HFA) 108 (90 Base) MCG/ACT inhaler 2 puff (has no administration in time range)  predniSONE (DELTASONE) tablet 40 mg (40 mg Oral Given 08/02/21 1338)    ED Course  I have reviewed the triage vital signs and the nursing notes.  Pertinent labs & imaging results that were available during my care of the patient were reviewed by me and considered in my medical decision making (see chart for details).    MDM Rules/Calculators/A&P                           Patient with URI symptoms.  History of COPD.  On chronic oxygen.  States she is feeling more fatigued.  Somewhat decreased oral intake.  Chest x-ray done reassuring.  Flu and COVID testing negative.  Feeling somewhat better.  Do not see if there is admission criteria here.  Feeling better.  Will treat symptomatically for the COPD with prednisone and will also give azithromycin.  Discharged Final Clinical Impression(s) / ED Diagnoses Final diagnoses:  Upper respiratory tract infection, unspecified type  Chronic obstructive pulmonary disease, unspecified COPD type ([place])    Rx /  DC Orders ED Discharge Orders          Ordered    predniSONE (DELTASONE) 10 MG tablet  Daily        08/02/21 1340    azithromycin (ZITHROMAX) 250 MG tablet  Daily        08/02/21 1340             Davonna Belling, MD 08/02/21 1504

## 2021-08-06 ENCOUNTER — Ambulatory Visit (HOSPITAL_COMMUNITY): Payer: Medicare Other

## 2021-08-08 ENCOUNTER — Ambulatory Visit (HOSPITAL_COMMUNITY): Payer: Medicare Other

## 2021-08-12 ENCOUNTER — Other Ambulatory Visit: Payer: Self-pay

## 2021-08-12 ENCOUNTER — Ambulatory Visit (HOSPITAL_COMMUNITY)
Admission: RE | Admit: 2021-08-12 | Discharge: 2021-08-12 | Disposition: A | Discharge status: Home / Self Care | Payer: Medicare Other | Source: Ambulatory Visit | Attending: Pulmonary Disease | Admitting: Pulmonary Disease

## 2021-08-12 DIAGNOSIS — R918 Other nonspecific abnormal finding of lung field: Secondary | ICD-10-CM

## 2021-08-13 ENCOUNTER — Ambulatory Visit (HOSPITAL_COMMUNITY): Payer: Medicare Other

## 2021-08-15 ENCOUNTER — Ambulatory Visit (HOSPITAL_COMMUNITY): Payer: Medicare Other

## 2021-08-16 ENCOUNTER — Other Ambulatory Visit: Payer: Self-pay | Admitting: Pulmonary Disease

## 2021-08-16 LAB — SARS CORONAVIRUS 2 (TAT 6-24 HRS): SARS Coronavirus 2: NEGATIVE

## 2021-08-17 ENCOUNTER — Encounter (HOSPITAL_COMMUNITY): Payer: Self-pay | Admitting: Pulmonary Disease

## 2021-08-17 NOTE — Progress Notes (Signed)
PCP - Madelynn Done NP @ [facility] - Traci Turner EKG - 08/02/21 Chest x-ray - 08/02/21 ( 1 view) ECHO - 08/24/20 Cardiac Cath - na CPAP - na  Aspirin Instructions: yes. No one has given pt. Instructions. Will not take it DOS  ERAS Protcol - yes  COVID TEST- yes 08/16/21  Anesthesia review: yes, home oxygen, medical hx.  -------------  SDW INSTRUCTIONS:  Your procedure is scheduled on 08/20/21. Please report to [facility] Main Entrance "A" at 5:30 A.M., and check in at the Admitting office. Call this number if you have problems the morning of surgery: [phone]   Remember: Do not eat  after midnight the night before your surgery  You may drink clear liquids until 0430 the morning of your surgery.   Clear liquids allowed are: Water, Non-Citrus Juices (without pulp), Carbonated Beverages, Clear Tea, Black Coffee Only, and Gatorade   Medications to take morning of surgery with a sip of water include: Breztri, zetia,levothyroxine,metoprolol,prednisone,crestor,hydrea,albuterolif needed and bring to hospital  As of today, STOP taking any Aleve, Naproxen, Ibuprofen, Motrin, Advil, Goody's, BC's, all herbal medications, fish oil, and all vitamins.    The Morning of Surgery Do not wear jewelry, make-up or nail polish. Do not wear lotions, powders, or perfumes/colognes, or deodorant Do not shave 48 hours prior to surgery.   Men may shave face and neck. Do not bring valuables to the hospital. [facility] is not responsible for any belongings or valuables.  If you are a smoker, DO NOT Smoke 24 hours prior to surgery  If you wear a CPAP at night please bring your mask the morning of surgery   Remember that you must have someone to transport you home after your surgery, and remain with you for 24 hours if you are discharged the same day.  Please bring cases for contacts, glasses, hearing aids, dentures or bridgework because it cannot be worn into surgery.   Patients  discharged the day of surgery will not be allowed to drive home.   Please shower the NIGHT BEFORE/MORNING OF SURGERY (use antibacterial soap like DIAL soap if possible). Wear comfortable clothes the morning of surgery. Oral Hygiene is also important to reduce your risk of infection.  Remember - BRUSH YOUR TEETH THE MORNING OF SURGERY WITH YOUR REGULAR TOOTHPASTE  Patient denies shortness of breath, fever, cough and chest pain.

## 2021-08-19 NOTE — Anesthesia Preprocedure Evaluation (Addendum)
Anesthesia Evaluation  Patient identified by MRN, date of birth, ID band Patient awake    Reviewed: Allergy & Precautions, NPO status , Patient's Chart, lab work & pertinent test results  Airway Mallampati: II  TM Distance: >3 FB Neck ROM: Full    Dental no notable dental hx.    Pulmonary COPD,  COPD inhaler and oxygen dependent, former smoker,    Pulmonary exam normal breath sounds clear to auscultation       Cardiovascular hypertension, Pt. on home beta blockers + CAD  Normal cardiovascular exam Rhythm:Regular Rate:Normal  ECG: SR, rate 88   Neuro/Psych Anxiety TIA   GI/Hepatic Neg liver ROS, hiatal hernia,   Endo/Other  Hypothyroidism   Renal/GU negative Renal ROS     Musculoskeletal  (+) Arthritis ,   Abdominal   Peds  Hematology negative hematology ROS (+)   Anesthesia Other Findings lung nodules  Reproductive/Obstetrics                            Anesthesia Physical Anesthesia Plan  ASA: 4  Anesthesia Plan: General   Post-op Pain Management:    Induction: Intravenous  PONV Risk Score and Plan: 3 and Ondansetron, Dexamethasone, Midazolam and Treatment may vary due to age or medical condition  Airway Management Planned: Oral ETT  Additional Equipment:   Intra-op Plan:   Post-operative Plan: Extubation in OR  Informed Consent: I have reviewed the patients History and Physical, chart, labs and discussed the procedure including the risks, benefits and alternatives for the proposed anesthesia with the patient or authorized representative who has indicated his/her understanding and acceptance.     Dental advisory given  Plan Discussed with: CRNA  Anesthesia Plan Comments: (Reviewed PAT note written 08/19/2021 by Myra Gianotti, PA-C. )       Anesthesia Quick Evaluation

## 2021-08-19 NOTE — Progress Notes (Addendum)
Anesthesia Chart Review:  Case: [id number] Date/Time: 08/20/21 0730   Procedure: ROBOTIC ASSISTED NAVIGATIONAL BRONCHOSCOPY (Bilateral) - ION w/ CIOS   Anesthesia type: General   Diagnosis: Pulmonary nodules [R91.8]   Pre-op diagnosis: lung nodules   Location: [facility] RO[facility] 3 / [facility]   Surgeons: Garner Nash, DO       DISCUSSION: Patient is a 65 year old female scheduled for the above procedure.  07/09/2021 showed moderate metabolic activity of the irregular right upper lobe nodules favored to be postinflammatory or infectious process, but cannot exclude neoplasm.  Tissue sampling versus CT surveillance recommended.  Dr. Chase Caller referred the patient to Dr. Valeta Harms for further evaluation.  History includes former smoker (quit 03/16/17), COPD/emphysema (home O2 @ 2L), pulmonary nodules (2018 LUL nodule, nondiagnostic bronchoscopy 07/29/17, improving 01/05/18 CT & thought to be inflammatory resolved/improved; new/enlarging right lung nodules 04/30/21 CTA), HTN, HLD (with elevated Coronary Calcium Score 10/07/19, non-ischemic stress test 10/25/19), TIA, essential thrombocythemia ("JAK2 V617F+; high risk by IPSET", on ASA, Hydrea), impaired fasting glucose, hiatal hernia, hypothyroidism, protein-calorie malnutrition, COVID-19 (03/28/21).   - ED visit 08/02/21 for URI symptoms, negative for flu and COVID. Did not meed criteria for admission, but treated out-patient with azithromycin, albuterol, and prednisone for COPD exacerbation.  She is currently on prednisone 5 mg daily.  08/16/2021 presurgical COVID-19 test negative.  CBC and be met were done on 08/02/2021.  She is a same-day work-up.  Anesthesia team to evaluate on the day of surgery.   VS: Ht 5' (1.524 m)    Wt 43.1 kg    BMI 18.55 kg/m  BP Readings from Last 3 Encounters:  08/02/21 (!) 104/55  07/18/21 124/60  07/16/21 140/80   Pulse Readings from Last 3 Encounters:  08/02/21 82  07/18/21 73  07/16/21 80      PROVIDERS: Loura Halt, NP is PCP [facility] @ Darron Doom, MD is cardiologist. Last visit 07/16/21.  No chest pain.  Continue aggressive risk factor modification.  Dyspnea was stable. Andres Labrum, MD is HEM. Last visit 03/26/21 for follow-up essential thrombocythemia.  Patient tolerating Hydrea every other day and aspirin daily.  Platelet count was up a bit, with plans to follow-up in 6 months.  (Previously saw [name], MD.) Brand Males, MD is primary pulmonologist.    LABS: Labs as indicated on the day of surgery. Currently, last available results include: Lab Results  Component Value Date   WBC 6.7 08/02/2021   HGB 13.1 08/02/2021   HCT 39.8 08/02/2021   PLT 370 08/02/2021   GLUCOSE 114 (H) 08/02/2021   CHOL 148 07/16/2021   TRIG 58 07/16/2021   HDL 57 07/16/2021   LDLCALC 79 07/16/2021   ALT 32 07/16/2021   AST 38 04/30/2021   NA 134 (L) 08/02/2021   K 4.1 08/02/2021   CL 97 (L) 08/02/2021   CREATININE 0.73 08/02/2021   BUN 14 08/02/2021   CO2 27 08/02/2021   TSH 0.372 08/22/2020   A1c 6.2% 04/22/21 [facility] Care Everywhere)    PFTs 07/19/21:  Latest Reference Range & Units 07/19/21 10:13  FVC-Pre L 1.48  FVC-%Pred-Pre % 47  FEV1-Pre L 0.54  FEV1-%Pred-Pre % 22  Pre FEV1/FVC ratio % 36  FEV1FVC-%Pred-Pre % 47  FEF 25-75 Pre L/sec 0.24  FEF2575-%Pred-Pre % 11  FEV6-Pre L 1.40  FEV6-%Pred-Pre % 47  Pre FEV6/FVC Ratio % 95  FEV6FVC-%Pred-Pre % 98  DLCO cor ml/min/mmHg 5.56  DLCO cor % pred % 28  DLCO unc ml/min/mmHg 5.56  DLCO unc % pred % 28  DL/VA ml/min/mmHg/L 2.00  DL/VA % pred % 47    IMAGES: CT Super D Chest 08/12/21: IMPRESSION: 1. Interval progression of consolidative airspace disease in the posterior right upper lobe, difficult to measure given the irregular margins but stretching at least 3.8 x 3.5 cm and incorporating the 14 x 6 mm posterior right upper lobe lesion seen on 06/24/2021. The adjacent 13 x 7  mm posterior right upper lobe lesion is stable in size along the margin of the worsening consolidative opacity. Given the rapid progression, infectious/inflammatory etiology is favored although close follow-up recommended. 2. Other bilateral pulmonary nodules are stable in the interval. 3. Aortic Atherosclerosis (ICD10-I70.0) and Emphysema (ICD10-J43.9).   PET Scan 07/09/21: IMPRESSION: 1. Moderate metabolic activity associated with irregular nodules clustered in the RIGHT upper lobe. Favor post inflammatory or infectious process. Cannot exclude neoplasm. Recommend tissue sampling versus CT surveillance. 2. Persistent but decreased activity in LEFT upper lobe parenchymal lesion.    EKG: 08/02/21: Sinus rhythm Biatrial enlargement Right axis deviation Borderline ST elevation, anterolateral leads T waves more prominent anterior laterally. Confirmed by Davonna Belling [phone]) on 08/02/2021 12:24:28 PM   CV: Echo 08/24/20: IMPRESSIONS   1. Left ventricular ejection fraction, by estimation, is 55 to 60%. The  left ventricle has normal function. The left ventricle has no regional  wall motion abnormalities. Left ventricular diastolic parameters are  indeterminate.   2. Right ventricular systolic function is normal. The right ventricular  size is normal.   3. The mitral valve is normal in structure. No evidence of mitral valve  regurgitation. No evidence of mitral stenosis. Moderate mitral annular  calcification.   4. The aortic valve was not well visualized. There is mild calcification  of the aortic valve. There is mild thickening of the aortic valve. Aortic  valve regurgitation is not visualized. No aortic stenosis is present.   5. The inferior vena cava is normal in size with greater than 50%  respiratory variability, suggesting right atrial pressure of 3 mmHg.   Cardiac event monitor 09/28/19-10/27/19: Sinus bradycardia, normal sinus rhythm and sinus tachycardia. The average  heart rate was 67bpm and ranged from 51 to 105bpm.  Nuclear stress test 10/25/19: Nuclear stress EF: 63%. There was no ST segment deviation noted during stress. The study is normal. This is a low risk study. The left ventricular ejection fraction is normal (55-65%). No ischemia.  CT Cardiac Calcium Scoring 10/07/19: IMPRESSION: Coronary calcium score of 678. This was 20 percentile for age and sex matched control.    Past Medical History:  Diagnosis Date   Agatston coronary artery calcium score greater than 400    score 678   Anxiety    Arthritis    "in neck"   COPD (chronic obstructive pulmonary disease) (HCC)    Dyspnea    Emphysema lung (HCC)    Essential thrombocythemia ([place]) 09/15/2019   History of hiatal hernia    Hx of TIA (transient ischemic attack) and stroke    Hyperlipidemia    Hypertension    Hypothyroidism    Impaired fasting glucose    Pleural effusion 05/15/2014   CXR 05/2014:  New small right effusion.     Pneumonia    Protein-calorie malnutrition, severe 09/17/2018   Pulmonary nodules 07/14/2018   07/2017 CT chest >pulmonary nodules noted.  07/2017 PET scan -Hypermetabolic LUL nodule  04/5026 CT chest >decreased nodule size, new pulmonary nodules noted.  FOB -atypical cells  CT chest 07/2018 >stable nodules to smaller on established nodules . 2 new nodules RUL , RLL >CT chest 3 months    Thyroid disease    Tobacco abuse 11/28/2015   Smoked 2 packs of cigarettes daily from teenage years until age 74, quit in 2016    Unspecified hypothyroidism 09/20/2013   Viral respiratory infection 09/16/2018    Past Surgical History:  Procedure Laterality Date   ESOPHAGOGASTRODUODENOSCOPY     EYE SURGERY     VIDEO BRONCHOSCOPY WITH ENDOBRONCHIAL NAVIGATION Left 07/29/2017   Procedure: VIDEO BRONCHOSCOPY WITH ENDOBRONCHIAL NAVIGATION;  Surgeon: Collene Gobble, MD;  Location: [facility] OR;  Service: Thoracic;  Laterality: Left;    MEDICATIONS: No current  facility-administered medications for this encounter.    albuterol (VENTOLIN HFA) 108 (90 Base) MCG/ACT inhaler   alendronate (FOSAMAX) 70 MG tablet   aspirin EC 81 MG tablet   BREZTRI AEROSPHERE 160-9-4.8 MCG/ACT AERO   ezetimibe (ZETIA) 10 MG tablet   feeding supplement (ENSURE ENLIVE / ENSURE PLUS) LIQD   hydroxyurea (HYDREA) 500 MG capsule   hydrOXYzine (ATARAX) 25 MG tablet   levothyroxine (SYNTHROID) 75 MCG tablet   metoprolol succinate (TOPROL-XL) 25 MG 24 hr tablet   Multiple Vitamin (MULTIVITAMIN WITH MINERALS) TABS tablet   predniSONE (DELTASONE) 5 MG tablet   roflumilast (DALIRESP) 500 MCG TABS tablet   rosuvastatin (CRESTOR) 40 MG tablet   azithromycin (ZITHROMAX) 250 MG tablet   OXYGEN   predniSONE (DELTASONE) 10 MG tablet   By current medication list, she is not taking azithromycin, prednisone 40 mg.  She is on prednisone 5 mg daily.   Myra Gianotti, PA-C Surgical Short Stay/Anesthesiology [facility] Phone [phone] [facility] Phone [phone] 08/19/2021 1:02 PM

## 2021-08-20 ENCOUNTER — Other Ambulatory Visit: Payer: Self-pay

## 2021-08-20 ENCOUNTER — Ambulatory Visit (HOSPITAL_COMMUNITY): Payer: Medicare Other

## 2021-08-20 ENCOUNTER — Encounter (HOSPITAL_COMMUNITY): Payer: Self-pay | Admitting: Pulmonary Disease

## 2021-08-20 ENCOUNTER — Ambulatory Visit (HOSPITAL_COMMUNITY)
Admission: RE | Admit: 2021-08-20 | Discharge: 2021-08-20 | Disposition: A | Discharge status: Home / Self Care | Payer: Medicare Other | Attending: Pulmonary Disease | Admitting: Pulmonary Disease

## 2021-08-20 ENCOUNTER — Ambulatory Visit (HOSPITAL_COMMUNITY): Payer: Medicare Other | Admitting: Vascular Surgery

## 2021-08-20 ENCOUNTER — Encounter (HOSPITAL_COMMUNITY)
Admission: RE | Disposition: A | Discharge status: Home / Self Care | Payer: Self-pay | Source: Home / Self Care | Attending: Pulmonary Disease

## 2021-08-20 DIAGNOSIS — Z87891 Personal history of nicotine dependence: Secondary | ICD-10-CM | POA: Insufficient documentation

## 2021-08-20 DIAGNOSIS — F419 Anxiety disorder, unspecified: Secondary | ICD-10-CM | POA: Insufficient documentation

## 2021-08-20 DIAGNOSIS — Z9981 Dependence on supplemental oxygen: Secondary | ICD-10-CM | POA: Diagnosis not present

## 2021-08-20 DIAGNOSIS — E785 Hyperlipidemia, unspecified: Secondary | ICD-10-CM | POA: Insufficient documentation

## 2021-08-20 DIAGNOSIS — R918 Other nonspecific abnormal finding of lung field: Secondary | ICD-10-CM | POA: Diagnosis present

## 2021-08-20 DIAGNOSIS — Z9889 Other specified postprocedural states: Secondary | ICD-10-CM | POA: Diagnosis not present

## 2021-08-20 DIAGNOSIS — E039 Hypothyroidism, unspecified: Secondary | ICD-10-CM | POA: Insufficient documentation

## 2021-08-20 DIAGNOSIS — Z419 Encounter for procedure for purposes other than remedying health state, unspecified: Secondary | ICD-10-CM

## 2021-08-20 DIAGNOSIS — Z8673 Personal history of transient ischemic attack (TIA), and cerebral infarction without residual deficits: Secondary | ICD-10-CM | POA: Diagnosis not present

## 2021-08-20 DIAGNOSIS — I700 Atherosclerosis of aorta: Secondary | ICD-10-CM | POA: Insufficient documentation

## 2021-08-20 DIAGNOSIS — Z801 Family history of malignant neoplasm of trachea, bronchus and lung: Secondary | ICD-10-CM | POA: Diagnosis not present

## 2021-08-20 DIAGNOSIS — J439 Emphysema, unspecified: Secondary | ICD-10-CM | POA: Insufficient documentation

## 2021-08-20 DIAGNOSIS — I3481 Nonrheumatic mitral (valve) annulus calcification: Secondary | ICD-10-CM | POA: Diagnosis not present

## 2021-08-20 DIAGNOSIS — I119 Hypertensive heart disease without heart failure: Secondary | ICD-10-CM | POA: Insufficient documentation

## 2021-08-20 DIAGNOSIS — M1990 Unspecified osteoarthritis, unspecified site: Secondary | ICD-10-CM | POA: Insufficient documentation

## 2021-08-20 DIAGNOSIS — K449 Diaphragmatic hernia without obstruction or gangrene: Secondary | ICD-10-CM | POA: Insufficient documentation

## 2021-08-20 DIAGNOSIS — J9611 Chronic respiratory failure with hypoxia: Secondary | ICD-10-CM | POA: Insufficient documentation

## 2021-08-20 DIAGNOSIS — J432 Centrilobular emphysema: Secondary | ICD-10-CM | POA: Insufficient documentation

## 2021-08-20 DIAGNOSIS — I2510 Atherosclerotic heart disease of native coronary artery without angina pectoris: Secondary | ICD-10-CM | POA: Diagnosis not present

## 2021-08-20 HISTORY — PX: VIDEO BRONCHOSCOPY WITH RADIAL ENDOBRONCHIAL ULTRASOUND: SHX6849

## 2021-08-20 HISTORY — PX: BRONCHIAL NEEDLE ASPIRATION BIOPSY: SHX5106

## 2021-08-20 HISTORY — DX: Dyspnea, unspecified: R06.00

## 2021-08-20 HISTORY — PX: FIDUCIAL MARKER PLACEMENT: SHX6858

## 2021-08-20 HISTORY — PX: BRONCHIAL BIOPSY: SHX5109

## 2021-08-20 HISTORY — PX: BRONCHIAL BRUSHINGS: SHX5108

## 2021-08-20 HISTORY — PX: BRONCHIAL WASHINGS: SHX5105

## 2021-08-20 SURGERY — BRONCHOSCOPY, WITH BIOPSY USING ELECTROMAGNETIC NAVIGATION
Anesthesia: General | Laterality: Bilateral

## 2021-08-20 MED ORDER — ONDANSETRON HCL 4 MG/2ML IJ SOLN: 4.0000 mg | Freq: Once | INTRAMUSCULAR | Status: DC | PRN

## 2021-08-20 MED ORDER — MIDAZOLAM HCL 5 MG/5ML IJ SOLN
INTRAMUSCULAR | Status: DC | PRN
  Administered 2021-08-20: 1 mg via INTRAVENOUS

## 2021-08-20 MED ORDER — PHENYLEPHRINE 40 MCG/ML (10ML) SYRINGE FOR IV PUSH (FOR BLOOD PRESSURE SUPPORT)
PREFILLED_SYRINGE | INTRAVENOUS | Status: DC | PRN
  Administered 2021-08-20: 80 ug via INTRAVENOUS
  Administered 2021-08-20: 160 ug via INTRAVENOUS
  Administered 2021-08-20 (×2): 120 ug via INTRAVENOUS
  Administered 2021-08-20: 160 ug via INTRAVENOUS
  Administered 2021-08-20: 80 ug via INTRAVENOUS
  Administered 2021-08-20: 160 ug via INTRAVENOUS
  Administered 2021-08-20: 40 ug via INTRAVENOUS

## 2021-08-20 MED ORDER — FENTANYL CITRATE (PF) 100 MCG/2ML IJ SOLN: 25.0000 ug | INTRAMUSCULAR | Status: DC | PRN

## 2021-08-20 MED ORDER — DEXAMETHASONE SODIUM PHOSPHATE 10 MG/ML IJ SOLN
INTRAMUSCULAR | Status: DC | PRN
  Administered 2021-08-20 (×2): 5 mg via INTRAVENOUS

## 2021-08-20 MED ORDER — PROPOFOL 10 MG/ML IV BOLUS
INTRAVENOUS | Status: DC | PRN
  Administered 2021-08-20: 100 mg via INTRAVENOUS

## 2021-08-20 MED ORDER — ONDANSETRON HCL 4 MG/2ML IJ SOLN
INTRAMUSCULAR | Status: DC | PRN
  Administered 2021-08-20: 4 mg via INTRAVENOUS

## 2021-08-20 MED ORDER — ACETAMINOPHEN 10 MG/ML IV SOLN: 650.0000 mg | Freq: Once | INTRAVENOUS | Status: DC | PRN

## 2021-08-20 MED ORDER — LACTATED RINGERS IV SOLN: INTRAVENOUS | Status: DC

## 2021-08-20 MED ORDER — CHLORHEXIDINE GLUCONATE 0.12 % MT SOLN: 15.0000 mL | Freq: Once | OROMUCOSAL | Status: AC

## 2021-08-20 MED ORDER — AMOXICILLIN-POT CLAVULANATE 875-125 MG PO TABS: 1.0000 | ORAL_TABLET | Freq: Two times a day (BID) | ORAL | 0 refills | Status: AC

## 2021-08-20 MED ORDER — ROCURONIUM BROMIDE 10 MG/ML (PF) SYRINGE
PREFILLED_SYRINGE | INTRAVENOUS | Status: DC | PRN
  Administered 2021-08-20: 10 mg via INTRAVENOUS
  Administered 2021-08-20: 40 mg via INTRAVENOUS

## 2021-08-20 MED ORDER — AMISULPRIDE (ANTIEMETIC) 5 MG/2ML IV SOLN: 10.0000 mg | Freq: Once | INTRAVENOUS | Status: DC | PRN

## 2021-08-20 MED ORDER — CHLORHEXIDINE GLUCONATE 0.12 % MT SOLN
OROMUCOSAL | Status: AC
  Administered 2021-08-20: 06:00:00 15 mL via OROMUCOSAL
  Filled 2021-08-20: qty 15

## 2021-08-20 MED ORDER — FENTANYL CITRATE (PF) 100 MCG/2ML IJ SOLN
INTRAMUSCULAR | Status: DC | PRN
  Administered 2021-08-20 (×2): 50 ug via INTRAVENOUS

## 2021-08-20 MED ORDER — PHENYLEPHRINE HCL-NACL 20-0.9 MG/250ML-% IV SOLN
INTRAVENOUS | Status: DC | PRN
  Administered 2021-08-20: 25 ug/min via INTRAVENOUS

## 2021-08-20 MED ORDER — PREDNISONE 20 MG PO TABS: 40.0000 mg | ORAL_TABLET | Freq: Every day | ORAL | 0 refills | Status: AC

## 2021-08-20 MED ORDER — EPHEDRINE SULFATE-NACL 50-0.9 MG/10ML-% IV SOSY
PREFILLED_SYRINGE | INTRAVENOUS | Status: DC | PRN
  Administered 2021-08-20: 5 mg via INTRAVENOUS
  Administered 2021-08-20: 10 mg via INTRAVENOUS

## 2021-08-20 MED ORDER — LIDOCAINE 2% (20 MG/ML) 5 ML SYRINGE
INTRAMUSCULAR | Status: DC | PRN
  Administered 2021-08-20: 40 mg via INTRAVENOUS

## 2021-08-20 MED ORDER — SUGAMMADEX SODIUM 200 MG/2ML IV SOLN
INTRAVENOUS | Status: DC | PRN
  Administered 2021-08-20: 100 mg via INTRAVENOUS

## 2021-08-20 SURGICAL SUPPLY — 1 items: Superlock fiducial marker ×2 IMPLANT

## 2021-08-20 NOTE — H&P (Signed)
Synopsis: Referred in November 2022 for lung nodule by [facility]   Subjective:    PATIENT ID: Cheryl Haley GENDER: female DOB: Jun 20, 1956, MRN: [id number]      Chief Complaint  Patient presents with   Follow-up      This is a 65 year old female, past medical history of significant emphysema, hypertension, hyperlipidemia, former tobacco abuse, quit in 2016.  In 2018 she had a left upper lobe PET avid nodule that was taken for navigational bronchoscopy by Dr. Lamonte Sakai.  This was nondiagnostic and subsequent CT imaging revealed resolution of this left upper lobe nodule.  She is been followed now by some time from Dr. Chase Caller in his clinic.  Ultimately patient had additional CT imaging done which showed a new left lung nodule.  Also has left upper lobe posterior nodules that look more inflammatory.  Patient ultimately had a nuclear medicine PET scan done which showed low-level uptake within both of these lesions.  Patient is very anxious about this due to the family history of malignancy and lung cancer.   Today in the office we reviewed her CT images and discuss plans.  08/20/2021: recently treated in ED with abx. Here today for bronchoscopy          Past Medical History:  Diagnosis Date   Agatston coronary artery calcium score greater than 400      score 678   Anxiety     Arthritis      "in neck"   COPD (chronic obstructive pulmonary disease) (HCC)     Emphysema lung (HCC)     Essential thrombocythemia ([place]) 09/15/2019   History of hiatal hernia     Hx of TIA (transient ischemic attack) and stroke     Hyperlipidemia     Hypertension     Hypothyroidism     Impaired fasting glucose     Pleural effusion 05/15/2014    CXR 05/2014:  New small right effusion.     Pneumonia     Protein-calorie malnutrition, severe 09/17/2018   Pulmonary nodules 07/14/2018    07/2017 CT chest >pulmonary nodules noted.  07/2017 PET scan -Hypermetabolic LUL nodule  12/1935 CT chest  >decreased nodule size, new pulmonary nodules noted.  FOB -atypical cells  CT chest 07/2018 >stable nodules to smaller on established nodules . 2 new nodules RUL , RLL >CT chest 3 months    Thyroid disease     Tobacco abuse 11/28/2015    Smoked 2 packs of cigarettes daily from teenage years until age 57, quit in 2016    Unspecified hypothyroidism 09/20/2013   Viral respiratory infection 09/16/2018           Family History  Problem Relation Age of Onset   Emphysema Father     Heart disease Father     Emphysema Brother     Emphysema Sister     Emphysema Sister     Heart disease Mother     Heart disease Sister     Heart disease Brother     Heart disease Brother     Congestive Heart Failure Sister     Congestive Heart Failure Sister     Cancer Sister          lung   Cancer Sister          breast           Past Surgical History:  Procedure Laterality Date   ESOPHAGOGASTRODUODENOSCOPY       VIDEO BRONCHOSCOPY  WITH ENDOBRONCHIAL NAVIGATION Left 07/29/2017    Procedure: VIDEO BRONCHOSCOPY WITH ENDOBRONCHIAL NAVIGATION;  Surgeon: Collene Gobble, MD;  Location: [facility] OR;  Service: Thoracic;  Laterality: Left;      Social History         Socioeconomic History   Marital status: Divorced      Spouse name: Not on file   Number of children: Not on file   Years of education: Not on file   Highest education level: Not on file  Occupational History   Not on file  Tobacco Use   Smoking status: Former      Packs/day: 2.00      Years: 40.00      Pack years: 80.00      Types: Cigarettes      Quit date: 03/16/2017      Years since quitting: 4.3   Smokeless tobacco: Never   Tobacco comments:         Vaping Use   Vaping Use: Never used  Substance and Sexual Activity   Alcohol use: Not Currently   Drug use: No   Sexual activity: Not on file  Other Topics Concern   Not on file  Social History Narrative   Not on file    Social Determinants of Health    Financial Resource  Strain: Not on file  Food Insecurity: Not on file  Transportation Needs: Not on file  Physical Activity: Not on file  Stress: Not on file  Social Connections: Not on file  Intimate Partner Violence: Not on file      No Known Allergies          Outpatient Medications Prior to Visit  Medication Sig Dispense Refill   albuterol (VENTOLIN HFA) 108 (90 Base) MCG/ACT inhaler Inhale 2 puffs into the lungs every 6 (six) hours as needed for wheezing or shortness of breath. 8 g 6   alendronate (FOSAMAX) 70 MG tablet Take 70 mg by mouth once a week.       aspirin EC 81 MG tablet Take 81 mg daily by mouth.        BREZTRI AEROSPHERE 160-9-4.8 MCG/ACT AERO INHALE 2 PUFFS IN THE MORNING AND AT BEDTIME 11 g 6   feeding supplement (ENSURE ENLIVE / ENSURE PLUS) LIQD Take 237 mLs by mouth 2 (two) times daily between meals. 237 mL 12   hydroxyurea (HYDREA) 500 MG capsule TAKE 1 CAPSULE BY MOUTH EVERY OTHER DAY (MAY  TAKE  WITH  FOOD  TO  MINIMIZE  GI  SIDE  EFFECTS) 15 capsule 2   levothyroxine (SYNTHROID) 75 MCG tablet Take 75 mcg by mouth every morning.       metoprolol succinate (TOPROL-XL) 25 MG 24 hr tablet Take 1 tablet (25 mg total) by mouth daily. 90 tablet 3   OXYGEN Inhale 2 L into the lungs continuous.       predniSONE (DELTASONE) 5 MG tablet Take 1 tablet (5 mg total) by mouth daily with breakfast. 30 tablet 5   roflumilast (DALIRESP) 500 MCG TABS tablet Take 1 tablet by mouth once daily 30 tablet 11   rosuvastatin (CRESTOR) 40 MG tablet Take 1 tablet (40 mg total) by mouth daily. 90 tablet 3    No facility-administered medications prior to visit.      Review of Systems  Constitutional:  Negative for chills, fever, malaise/fatigue and weight loss.  HENT:  Negative for hearing loss, sore throat and tinnitus.   Eyes:  Negative for blurred vision and  double vision.  Respiratory:  Positive for cough. Negative for hemoptysis, sputum production, shortness of breath, wheezing and stridor.    Cardiovascular:  Negative for chest pain, palpitations, orthopnea, leg swelling and PND.  Gastrointestinal:  Negative for abdominal pain, constipation, diarrhea, heartburn, nausea and vomiting.  Genitourinary:  Negative for dysuria, hematuria and urgency.  Musculoskeletal:  Negative for joint pain and myalgias.  Skin:  Negative for itching and rash.  Neurological:  Negative for dizziness, tingling, weakness and headaches.  Endo/Heme/Allergies:  Negative for environmental allergies. Does not bruise/bleed easily.  Psychiatric/Behavioral:  Negative for depression. The patient is not nervous/anxious and does not have insomnia.   All other systems reviewed and are negative.      Objective:   General appearance: 65 y.o., female, NAD, conversant  Eyes: anicteric sclerae, moist conjunctivae; no lid-lag; PERRLA, tracking appropriately HENT: NCAT; oropharynx, MMM, no mucosal ulcerations; normal hard and soft palate Neck: Trachea midline; FROM, supple, lymphadenopathy, no JVD Lungs: CTAB, no crackles, no wheeze, with normal respiratory effort and no intercostal retractions CV: RRR, S1, S2, no MRGs  Abdomen: Soft, non-tender; non-distended, BS present  Extremities: No peripheral edema, radial and DP pulses present bilaterally  Skin: Normal temperature, turgor and texture; no rash Psych: Appropriate affect Neuro: Alert and oriented to person and place, no focal deficit     BP (!) 163/52    Pulse 72    Temp 98 F (36.7 C) (Oral)    Resp 18    Ht 5' (1.524 m)    Wt 43.1 kg    SpO2 100%    BMI 18.55 kg/m      CBC Labs (Brief)          Component Value Date/Time    WBC 7.6 04/30/2021 1423    RBC 3.83 (L) 04/30/2021 1423    HGB 12.0 04/30/2021 1423    HGB 12.3 03/26/2021 1005    HCT 36.4 04/30/2021 1423    PLT 408 (H) 04/30/2021 1423    PLT 378 03/26/2021 1005    MCV 95.0 04/30/2021 1423    MCH 31.3 04/30/2021 1423    MCHC 33.0 04/30/2021 1423    RDW 17.3 (H) 04/30/2021 1423    LYMPHSABS  0.5 (L) 04/30/2021 1423    MONOABS 0.5 04/30/2021 1423    EOSABS 0.0 04/30/2021 1423    BASOSABS 0.0 04/30/2021 1423        Chest Imaging: June 24, 2021: CT chest: Upper lobe pulmonary nodule on the left side, posterior lesions to the look more inflammatory in nature. This is new nodule however compared to her imaging that was done last year. The patient's images have been independently reviewed by me.    08/12/2021: CT Chest  Stable dominate Rul nodule RUL posterior lesions have changed and look infectious to me  The patient's images have been independently reviewed by me.     Pulmonary Functions Testing Results: No flowsheet data found.   FeNO:    Pathology:    Echocardiogram:    Heart Catheterization:     Assessment & Plan:        ICD-10-CM    1. Pulmonary nodules  R91.8 CT Super D Chest Wo Contrast      Procedural/ Surgical Case Request: ROBOTIC ASSISTED NAVIGATIONAL BRONCHOSCOPY      Ambulatory referral to Pulmonology     2. Chronic hypoxemic respiratory failure (HCC)  J96.11       3. Centrilobular emphysema ([place])  J43.2  Discussion:   This is a 65 year old female, history of pulmonary nodules, his family history of lung cancer, centrilobular emphysema, chronic hypoxemic respiratory failure and pulmonary nodules that are new on the left side.  Plan: Plan for outpatient bronchoscopy today  Discussed risks benefits and alternatives.  The patient is agreeable to proceed.  Discussed risks of bleeding and pneumothorax  Garner Nash, DO [facility] 08/20/2021 7:13 AM

## 2021-08-20 NOTE — Interval H&P Note (Signed)
History and Physical Interval Note:  08/20/2021 7:16 AM  Cheryl Haley  has presented today for surgery, with the diagnosis of lung nodules.  The various methods of treatment have been discussed with the patient and family. After consideration of risks, benefits and other options for treatment, the patient has consented to  Procedure(s) with comments: ROBOTIC ASSISTED NAVIGATIONAL BRONCHOSCOPY (Bilateral) - ION w/ CIOS as a surgical intervention.  The patient's history has been reviewed, patient examined, no change in status, stable for surgery.  I have reviewed the patient's chart and labs.  Questions were answered to the patient's satisfaction.     [place]

## 2021-08-20 NOTE — Op Note (Addendum)
Video Bronchoscopy with Robotic Assisted Bronchoscopic Navigation  ° °Date of Operation: 08/20/2021  ° °Pre-op Diagnosis: Lung nodules ° °Post-op Diagnosis: Lung nodule ° °Surgeon: Bradley L Icard, DO  ° °Assistants: none  ° °Anesthesia: General endotracheal anesthesia ° °Operation: Flexible video fiberoptic bronchoscopy with robotic assistance and biopsies. ° °Estimated Blood Loss: Minimal ° °Complications: None ° °Indications and History: °Cheryl Haley is a 65 y.o. female with history of multiple lung nodules. The risks, benefits, complications, treatment options and expected outcomes were discussed with the patient.  The possibilities of pneumothorax, pneumonia, reaction to medication, pulmonary aspiration, perforation of a viscus, bleeding, failure to diagnose a condition and creating a complication requiring transfusion or operation were discussed with the patient who freely signed the consent.   ° °Description of Procedure: °The patient was seen in the Preoperative Area, was examined and was deemed appropriate to proceed.  The patient was taken to [facility] endoscopy room 3, identified as Allan Schuknecht and the procedure verified as Flexible Video Fiberoptic Bronchoscopy.  A Time Out was held and the above information confirmed.  ° °Prior to the date of the procedure a high-resolution CT scan of the chest was performed. Utilizing ION software program a virtual tracheobronchial tree was generated to allow the creation of distinct navigation pathways to the patient's parenchymal abnormalities. After being taken to the operating room general anesthesia was initiated and the patient  was orally intubated. The video fiberoptic bronchoscope was introduced via the endotracheal tube and a general inspection was performed which showed normal right and left lung anatomy, aspiration of the bilateral mainstems was completed to remove any remaining secretions. Robotic catheter inserted into patient's endotracheal tube.   ° °Target #1 right upper lobe lateral nodule: °The distinct navigation pathways prepared prior to this procedure were then utilized to navigate to patient's lesion identified on CT scan. The robotic catheter was secured into place and the vision probe was withdrawn.  Lesion location was approximated using fluoroscopy and radial endobronchial ultrasound and three-dimensional cone beam CT imaging for peripheral targeting. Under fluoroscopic guidance transbronchial needle brushings, transbronchial needle biopsies, and transbronchial forceps biopsies were performed to be sent for cytology and pathology.  Following tissue sampling a single fiducial was placed in the proximity of the lesion under fluoroscopy using the fiducial catheter wire delivery kit.  A bronchioalveolar lavage was performed in the right upper lobe posterior segment and sent for cultures. ° °At the end of the procedure a general airway inspection was performed and there was no evidence of active bleeding. The bronchoscope was removed.  The patient tolerated the procedure well. There was no significant blood loss and there were no obvious complications. A post-procedural chest x-ray is pending. ° °Samples Target #1: °1. Transbronchial needle brushings from right upper lobe lateral °2. Transbronchial Wang needle biopsies from right upper lobe lateral nodule °3. Transbronchial forceps biopsies from right upper lobe lateral nodule °4. Bronchoalveolar lavage from right upper lobe ° °Plans:  °The patient will be discharged from the PACU to home when recovered from anesthesia and after chest x-ray is reviewed. We will review the cytology, pathology and microbiology results with the patient when they become available. Outpatient followup will be with Bradley L Icard, DO  ° °Bradley L Icard, DO ° Pulmonary Critical Care °08/20/2021 8:35 AM    ° °

## 2021-08-20 NOTE — Transfer of Care (Signed)
Immediate Anesthesia Transfer of Care Note  Patient: Cheryl Haley  Procedure(s) Performed: ROBOTIC ASSISTED NAVIGATIONAL BRONCHOSCOPY (Bilateral) BRONCHIAL BIOPSIES BRONCHIAL NEEDLE ASPIRATION BIOPSIES BRONCHIAL BRUSHINGS RADIAL ENDOBRONCHIAL ULTRASOUND BRONCHIAL WASHINGS FIDUCIAL MARKER PLACEMENT  Patient Location: PACU  Anesthesia Type:General  Level of Consciousness: awake, alert , oriented and patient cooperative  Airway & Oxygen Therapy: Patient Spontanous Breathing and Patient connected to nasal cannula oxygen  Post-op Assessment: Report given to RN and Post -op Vital signs reviewed and stable  Post vital signs: Reviewed and stable  Last Vitals:  Vitals Value Taken Time  BP 112/57 08/20/21 0841  Temp    Pulse 113 08/20/21 0843  Resp 22 08/20/21 0843  SpO2 88 % 08/20/21 0843  Vitals shown include unvalidated device data.  Last Pain:  Vitals:   08/20/21 0603  TempSrc: Oral  PainSc:          Complications: No notable events documented.

## 2021-08-20 NOTE — Anesthesia Procedure Notes (Signed)
Procedure Name: Intubation Date/Time: 08/20/2021 7:39 AM Performed by: Colin Benton, CRNA Pre-anesthesia Checklist: Patient identified, Emergency Drugs available, Suction available and Patient being monitored Patient Re-evaluated:Patient Re-evaluated prior to induction Oxygen Delivery Method: Circle system utilized Preoxygenation: Pre-oxygenation with 100% oxygen Induction Type: IV induction Ventilation: Mask ventilation without difficulty Laryngoscope Size: Miller and 2 Grade View: Grade I Tube type: Oral Tube size: 8.5 mm Number of attempts: 1 Airway Equipment and Method: Stylet Placement Confirmation: ETT inserted through vocal cords under direct vision, positive ETCO2 and breath sounds checked- equal and bilateral Secured at: 21 cm Tube secured with: Tape Dental Injury: Teeth and Oropharynx as per pre-operative assessment

## 2021-08-20 NOTE — Anesthesia Postprocedure Evaluation (Signed)
Anesthesia Post Note  Patient: Cheryl Haley  Procedure(s) Performed: ROBOTIC ASSISTED NAVIGATIONAL BRONCHOSCOPY (Bilateral) BRONCHIAL BIOPSIES BRONCHIAL NEEDLE ASPIRATION BIOPSIES BRONCHIAL BRUSHINGS RADIAL ENDOBRONCHIAL ULTRASOUND BRONCHIAL WASHINGS FIDUCIAL MARKER PLACEMENT     Patient location during evaluation: PACU Anesthesia Type: General Level of consciousness: awake Pain management: pain level controlled Vital Signs Assessment: post-procedure vital signs reviewed and stable Respiratory status: spontaneous breathing, nonlabored ventilation, respiratory function stable and patient connected to nasal cannula oxygen Cardiovascular status: blood pressure returned to baseline and stable Postop Assessment: no apparent nausea or vomiting Anesthetic complications: no   No notable events documented.  Last Vitals:  Vitals:   08/20/21 0856 08/20/21 0911  BP: (!) 109/55 (!) 98/51  Pulse: (!) 108 (!) 102  Resp: (!) 25 19  Temp:  36.4 C  SpO2: 94% 95%    Last Pain:  Vitals:   08/20/21 0911  TempSrc:   PainSc: 0-No pain                 Saurav Crumble P Yuvraj Pfeifer

## 2021-08-20 NOTE — Discharge Instructions (Signed)
Flexible Bronchoscopy, Care After This sheet gives you information about how to care for yourself after your test. Your doctor may also give you more specific instructions. If you have problems or questions, contact your doctor. Follow these instructions at home: Eating and drinking Do not eat or drink anything (not even water) for 2 hours after your test, or until your numbing medicine (local anesthetic) wears off. When your numbness is gone and your cough and gag reflexes have come back, you may: Eat only soft foods. Slowly drink liquids. The day after the test, go back to your normal diet. Driving Do not drive for 24 hours if you were given a medicine to help you relax (sedative). Do not drive or use heavy machinery while taking prescription pain medicine. General instructions  Take over-the-counter and prescription medicines only as told by your doctor. Return to your normal activities as told. Ask what activities are safe for you. Do not use any products that have nicotine or tobacco in them. This includes cigarettes and e-cigarettes. If you need help quitting, ask your doctor. Keep all follow-up visits as told by your doctor. This is important. It is very important if you had a tissue sample (biopsy) taken. Get help right away if: You have shortness of breath that gets worse. You get light-headed. You feel like you are going to pass out (faint). You have chest pain. You cough up: More than a little blood. More blood than before. Summary Do not eat or drink anything (not even water) for 2 hours after your test, or until your numbing medicine wears off. Do not use cigarettes. Do not use e-cigarettes. Get help right away if you have chest pain.  This information is not intended to replace advice given to you by your health care provider. Make sure you discuss any questions you have with your health care provider. Document Released: 06/15/2009 Document Revised: 07/31/2017 Document  Reviewed: 09/05/2016 Elsevier Patient Education  2020 [employer].

## 2021-08-22 ENCOUNTER — Encounter (HOSPITAL_COMMUNITY): Payer: Self-pay | Admitting: Pulmonary Disease

## 2021-08-22 ENCOUNTER — Ambulatory Visit (HOSPITAL_COMMUNITY): Payer: Medicare Other

## 2021-08-22 LAB — CULTURE, BAL-QUANTITATIVE W GRAM STAIN: Culture: 20000 — AB

## 2021-08-22 LAB — ACID FAST SMEAR (AFB, MYCOBACTERIA): Acid Fast Smear: NEGATIVE

## 2021-08-22 LAB — CYTOLOGY - NON PAP

## 2021-08-25 LAB — AEROBIC/ANAEROBIC CULTURE W GRAM STAIN (SURGICAL/DEEP WOUND): Culture: NORMAL

## 2021-08-27 ENCOUNTER — Other Ambulatory Visit: Payer: Self-pay | Admitting: Internal Medicine

## 2021-08-27 ENCOUNTER — Ambulatory Visit (HOSPITAL_COMMUNITY): Payer: Medicare Other

## 2021-08-29 ENCOUNTER — Other Ambulatory Visit: Payer: Self-pay

## 2021-08-29 ENCOUNTER — Ambulatory Visit (INDEPENDENT_AMBULATORY_CARE_PROVIDER_SITE_OTHER): Payer: Medicare Other | Admitting: Pulmonary Disease

## 2021-08-29 ENCOUNTER — Encounter: Payer: Self-pay | Admitting: Pulmonary Disease

## 2021-08-29 ENCOUNTER — Ambulatory Visit (HOSPITAL_COMMUNITY): Payer: Medicare Other

## 2021-08-29 VITALS — BP 120/70 | HR 75 | Temp 97.8°F | Ht 64.0 in | Wt 97.6 lb

## 2021-08-29 DIAGNOSIS — J9611 Chronic respiratory failure with hypoxia: Secondary | ICD-10-CM

## 2021-08-29 DIAGNOSIS — R918 Other nonspecific abnormal finding of lung field: Secondary | ICD-10-CM | POA: Diagnosis not present

## 2021-08-29 DIAGNOSIS — R911 Solitary pulmonary nodule: Secondary | ICD-10-CM | POA: Diagnosis not present

## 2021-08-29 DIAGNOSIS — J449 Chronic obstructive pulmonary disease, unspecified: Secondary | ICD-10-CM | POA: Diagnosis not present

## 2021-08-29 DIAGNOSIS — J432 Centrilobular emphysema: Secondary | ICD-10-CM

## 2021-08-29 NOTE — Patient Instructions (Signed)
Thank you for visiting Dr. Valeta Harms at [facility]. Today we recommend the following:  Orders Placed This Encounter  Procedures   CT Super D Chest Wo Contrast   CT chest in 6 months.   Return in about 6 months (around 02/27/2022) for with Eric Form, NP, or Dr. Valeta Harms.    Please do your part to reduce the spread of COVID-19.

## 2021-08-29 NOTE — Progress Notes (Signed)
Synopsis: Referred in November 2022 for lung nodule by [facility]  Subjective:   PATIENT ID: Cheryl Haley GENDER: female DOB: 05/12/1956, MRN: [id number]  Chief Complaint  Patient presents with   Follow-up    Follow up. Patient has no complaints.    This is a 65 year old female, past medical history of significant emphysema, hypertension, hyperlipidemia, former tobacco abuse, quit in 2016.  In 2018 she had a left upper lobe PET avid nodule that was taken for navigational bronchoscopy by Dr. Lamonte Sakai.  This was nondiagnostic and subsequent CT imaging revealed resolution of this left upper lobe nodule.  She is been followed now by some time from Dr. Chase Caller in his clinic.  Ultimately patient had additional CT imaging done which showed a new left lung nodule.  Also has left upper lobe posterior nodules that look more inflammatory.  Patient ultimately had a nuclear medicine PET scan done which showed low-level uptake within both of these lesions.  Patient is very anxious about this due to the family history of malignancy and lung cancer.  Today in the office we reviewed her CT images and discuss plans.  OV 08/29/2021: Here today for follow-up after recent bronchoscopy.  Bronchoscopy results are negative for malignancy.  Between her PET scan and the super D the nodule has progressed some suggestive of an infectious etiology.  Cultures so far no growth to date.  She was treated with antibiotics and she completes her antibiotic course today.  From a respiratory standpoint she is doing well using her Breztri plus Daliresp and as needed albuterol.  Follows with Dr. Chase Caller in pulmonary clinic.  For her advanced COPD and chronic hypoxemic respiratory failure.   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     Pneumonia    Protein-calorie malnutrition, severe 09/17/2018   Pulmonary nodules 07/14/2018   07/2017 CT chest >pulmonary nodules noted.  07/2017 PET scan -Hypermetabolic LUL nodule  03/3531 CT chest >decreased nodule size, new pulmonary nodules noted.  FOB -atypical cells  CT chest 07/2018 >stable nodules to smaller on established nodules . 2 new nodules RUL , RLL >CT chest 3 months    Thyroid disease    Tobacco abuse 11/28/2015   Smoked 2 packs of cigarettes daily from teenage years until age 43, quit in 2016    Unspecified hypothyroidism 09/20/2013   Viral respiratory infection 09/16/2018     Family History  Problem Relation Age of Onset   Emphysema Father    Heart disease Father    Emphysema Brother    Emphysema Sister    Emphysema Sister    Heart disease Mother    Heart disease Sister    Heart disease Brother    Heart disease Brother    Congestive Heart Failure Sister    Congestive Heart Failure Sister    Cancer Sister        lung  Cancer Sister        breast     Past Surgical History:  Procedure Laterality Date   BRONCHIAL BIOPSY  08/20/2021   Procedure: BRONCHIAL BIOPSIES;  Surgeon: Garner Nash, DO;  Location: [facility];  Service: Pulmonary;;   BRONCHIAL BRUSHINGS  08/20/2021   Procedure: BRONCHIAL BRUSHINGS;  Surgeon: Garner Nash, DO;  Location: [facility];  Service: Pulmonary;;   BRONCHIAL NEEDLE ASPIRATION BIOPSY  08/20/2021   Procedure: BRONCHIAL NEEDLE ASPIRATION BIOPSIES;  Surgeon: Garner Nash, DO;  Location: [facility];  Service: Pulmonary;;   BRONCHIAL WASHINGS  08/20/2021   Procedure: BRONCHIAL WASHINGS;  Surgeon: Garner Nash,  DO;  Location: [facility];  Service: Pulmonary;;   ESOPHAGOGASTRODUODENOSCOPY     EYE SURGERY     FIDUCIAL MARKER PLACEMENT  08/20/2021   Procedure: FIDUCIAL MARKER PLACEMENT;  Surgeon: Garner Nash, DO;  Location: [facility];  Service: Pulmonary;;   VIDEO BRONCHOSCOPY WITH ENDOBRONCHIAL NAVIGATION Left 07/29/2017   Procedure: VIDEO BRONCHOSCOPY WITH ENDOBRONCHIAL NAVIGATION;  Surgeon: Collene Gobble, MD;  Location: [facility];  Service: Thoracic;  Laterality: Left;   VIDEO BRONCHOSCOPY WITH RADIAL ENDOBRONCHIAL ULTRASOUND  08/20/2021   Procedure: RADIAL ENDOBRONCHIAL ULTRASOUND;  Surgeon: Garner Nash, DO;  Location: [facility];  Service: Pulmonary;;    Social History   Socioeconomic History   Marital status: Divorced    Spouse name: Not on file   Number of children: Not on file   Years of education: Not on file   Highest education level: Not on file  Occupational History   Not on file  Tobacco Use   Smoking status: Former    Packs/day: 2.00    Years: 40.00    Pack years: 80.00    Types: Cigarettes    Quit date: 03/16/2017    Years since quitting: 4.4   Smokeless tobacco: Never   Tobacco comments:       Vaping Use   Vaping Use: Never used  Substance and Sexual Activity   Alcohol use: Not Currently   Drug use: No   Sexual activity: Not on file  Other Topics Concern   Not on file  Social History Narrative   Not on file   Social Determinants of Health   Financial Resource Strain: Not on file  Food Insecurity: Not on file  Transportation Needs: Not on file  Physical Activity: Not on file  Stress: Not on file  Social Connections: Not on file  Intimate Partner Violence: Not on file     No Known Allergies   Outpatient Medications Prior to Visit  Medication Sig Dispense Refill   albuterol (VENTOLIN HFA) 108 (90 Base) MCG/ACT inhaler Inhale 2 puffs into the lungs every 6 (six) hours as needed for wheezing or shortness of breath. 8 g 6   alendronate  (FOSAMAX) 70 MG tablet Take 70 mg by mouth every Saturday.     amoxicillin-clavulanate (AUGMENTIN) 875-125 MG tablet Take 1 tablet by mouth 2 (two) times daily for 10 days. 20 tablet 0   aspirin EC 81 MG tablet Take 81 mg daily by mouth.      BREZTRI AEROSPHERE 160-9-4.8 MCG/ACT AERO INHALE 2 PUFFS IN THE MORNING AND AT BEDTIME (Patient taking differently: Inhale 2 puffs into the lungs 2 (two) times daily.) 11 g 6   ezetimibe (ZETIA) 10 MG tablet Take 1 tablet (10 mg total) by mouth daily. 90 tablet 3   feeding supplement (ENSURE ENLIVE / ENSURE PLUS) LIQD Take 237 mLs  by mouth 2 (two) times daily between meals. (Patient taking differently: Take 237 mLs by mouth daily.) 237 mL 12   hydroxyurea (HYDREA) 500 MG capsule TAKE 1 CAPSULE BY MOUTH EVERY OTHER DAY (MAY  TAKE  WITH  FOOD  TO  MINIMIZE  GI  SIDE  EFFECTS) 15 capsule 2   hydrOXYzine (ATARAX) 25 MG tablet Take 25 mg by mouth at bedtime as needed (sleep).     levothyroxine (SYNTHROID) 75 MCG tablet Take 75 mcg by mouth every morning.     metoprolol succinate (TOPROL-XL) 25 MG 24 hr tablet Take 1 tablet (25 mg total) by mouth daily. 90 tablet 3   Multiple Vitamin (MULTIVITAMIN WITH MINERALS) TABS tablet Take 1 tablet by mouth daily.     OXYGEN Inhale 2 L into the lungs continuous.     predniSONE (DELTASONE) 5 MG tablet Take 1 tablet by mouth once daily with breakfast 30 tablet 0   roflumilast (DALIRESP) 500 MCG TABS tablet Take 1 tablet by mouth once daily 30 tablet 11   rosuvastatin (CRESTOR) 40 MG tablet Take 1 tablet (40 mg total) by mouth daily. 90 tablet 3   No facility-administered medications prior to visit.    Review of Systems  Constitutional:  Negative for chills, fever, malaise/fatigue and weight loss.  HENT:  Negative for hearing loss, sore throat and tinnitus.   Eyes:  Negative for blurred vision and double vision.  Respiratory:  Positive for shortness of breath. Negative for cough, hemoptysis, sputum production, wheezing and  stridor.   Cardiovascular:  Negative for chest pain, palpitations, orthopnea, leg swelling and PND.  Gastrointestinal:  Negative for abdominal pain, constipation, diarrhea, heartburn, nausea and vomiting.  Genitourinary:  Negative for dysuria, hematuria and urgency.  Musculoskeletal:  Negative for joint pain and myalgias.  Skin:  Negative for itching and rash.  Neurological:  Negative for dizziness, tingling, weakness and headaches.  Endo/Heme/Allergies:  Negative for environmental allergies. Does not bruise/bleed easily.  Psychiatric/Behavioral:  Negative for depression. The patient is not nervous/anxious and does not have insomnia.   All other systems reviewed and are negative.   Objective:  Physical Exam Vitals reviewed.  Constitutional:      General: She is not in acute distress.    Appearance: She is well-developed.  HENT:     Head: Normocephalic and atraumatic.  Eyes:     General: No scleral icterus.    Conjunctiva/sclera: Conjunctivae normal.     Pupils: Pupils are equal, round, and reactive to light.  Neck:     Vascular: No JVD.     Trachea: No tracheal deviation.  Cardiovascular:     Rate and Rhythm: Normal rate and regular rhythm.     Heart sounds: Normal heart sounds. No murmur heard. Pulmonary:     Effort: Pulmonary effort is normal. No tachypnea, accessory muscle usage or respiratory distress.     Breath sounds: No stridor. No wheezing, rhonchi or rales.     Comments: Diminished breath sounds bilaterally Abdominal:     General: There is no distension.     Palpations: Abdomen is soft.     Tenderness: There is no abdominal tenderness.  Musculoskeletal:        General: No tenderness.     Cervical back: Neck supple.  Lymphadenopathy:     Cervical: No cervical adenopathy.  Skin:    General: Skin is warm and dry.     Capillary Refill: Capillary refill takes less than 2 seconds.     Findings: No  rash.  Neurological:     Mental Status: She is alert and oriented to  person, place, and time.  Psychiatric:        Behavior: Behavior normal.     Vitals:   08/29/21 0851  BP: 120/70  Pulse: 75  Temp: 97.8 F (36.6 C)  TempSrc: Oral  SpO2: 98%  Weight: 97 lb 9.6 oz (44.3 kg)  Height: 5\' 4"  (1.626 m)   98% on RA BMI Readings from Last 3 Encounters:  08/29/21 16.75 kg/m  08/20/21 18.55 kg/m  08/02/21 16.24 kg/m   Wt Readings from Last 3 Encounters:  08/29/21 97 lb 9.6 oz (44.3 kg)  08/20/21 95 lb (43.1 kg)  08/02/21 94 lb 9.6 oz (42.9 kg)     CBC    Component Value Date/Time   WBC 6.7 08/02/2021 1207   RBC 4.24 08/02/2021 1207   HGB 13.1 08/02/2021 1207   HGB 12.3 03/26/2021 1005   HCT 39.8 08/02/2021 1207   PLT 370 08/02/2021 1207   PLT 378 03/26/2021 1005   MCV 93.9 08/02/2021 1207   MCH 30.9 08/02/2021 1207   MCHC 32.9 08/02/2021 1207   RDW 14.4 08/02/2021 1207   LYMPHSABS 0.5 (L) 04/30/2021 1423   MONOABS 0.5 04/30/2021 1423   EOSABS 0.0 04/30/2021 1423   BASOSABS 0.0 04/30/2021 1423    Chest Imaging: June 24, 2021: CT chest: Upper lobe pulmonary nodule on the left side, posterior lesions to the look more inflammatory in nature. This is new nodule however compared to her imaging that was done last year. The patient's images have been independently reviewed by me.    08/12/2021 super D CT chest: Interval progression of the consolidation within the right upper lobe posterior segment.  The adjacent right upper lobe lesion in the lateral aspect was still persistent. The patient's images have been independently reviewed by me.     Pulmonary Functions Testing Results: PFT Results Latest Ref Rng & Units 07/19/2021  FVC-Pre L 1.48  FVC-Predicted Pre % 47  Pre FEV1/FVC % % 36  FEV1-Pre L 0.54  FEV1-Predicted Pre % 22  DLCO uncorrected ml/min/mmHg 5.56  DLCO UNC% % 28  DLCO corrected ml/min/mmHg 5.56  DLCO COR %Predicted % 28  DLVA Predicted % 47    FeNO:   Pathology:   Echocardiogram:   Heart  Catheterization:     Assessment & Plan:     ICD-10-CM   1. Lung nodule  R91.1 CT Super D Chest Wo Contrast    2. Stage 3 severe COPD by GOLD classification ([place])  J44.9     3. Pulmonary nodules  R91.8     4. Chronic hypoxemic respiratory failure (HCC)  J96.11     5. Centrilobular emphysema ([place])  J43.2       Discussion:  This is a 65 year old female, history of pulmonary nodules, history family history of lung cancer, centrilobular emphysema, chronic hypoxemic respiratory failure status post navigational bronchoscopy on 08/20/2021.  Patient was found to have tissue biopsies that were negative for malignancy there was progression of the right upper lobe posterior infiltrate suggestive of an infectious/inflammatory disorder.  Plan: We will follow the remaining of her cultures to include AFB and fungus these will be held for couple of weeks. So far no growth to date. I think she needs a repeat noncontrasted CT scan of the chest in 6 months. Continue Breztri Continue albuterol as needed Continue Daliresp. Return to clinic in 6 months after repeat noncontrasted CT scan of the  chest.    Current Outpatient Medications:    albuterol (VENTOLIN HFA) 108 (90 Base) MCG/ACT inhaler, Inhale 2 puffs into the lungs every 6 (six) hours as needed for wheezing or shortness of breath., Disp: 8 g, Rfl: 6   alendronate (FOSAMAX) 70 MG tablet, Take 70 mg by mouth every Saturday., Disp: , Rfl:    amoxicillin-clavulanate (AUGMENTIN) 875-125 MG tablet, Take 1 tablet by mouth 2 (two) times daily for 10 days., Disp: 20 tablet, Rfl: 0   aspirin EC 81 MG tablet, Take 81 mg daily by mouth. , Disp: , Rfl:    BREZTRI AEROSPHERE 160-9-4.8 MCG/ACT AERO, INHALE 2 PUFFS IN THE MORNING AND AT BEDTIME (Patient taking differently: Inhale 2 puffs into the lungs 2 (two) times daily.), Disp: 11 g, Rfl: 6   ezetimibe (ZETIA) 10 MG tablet, Take 1 tablet (10 mg total) by mouth daily., Disp: 90 tablet, Rfl: 3   feeding  supplement (ENSURE ENLIVE / ENSURE PLUS) LIQD, Take 237 mLs by mouth 2 (two) times daily between meals. (Patient taking differently: Take 237 mLs by mouth daily.), Disp: 237 mL, Rfl: 12   hydroxyurea (HYDREA) 500 MG capsule, TAKE 1 CAPSULE BY MOUTH EVERY OTHER DAY (MAY  TAKE  WITH  FOOD  TO  MINIMIZE  GI  SIDE  EFFECTS), Disp: 15 capsule, Rfl: 2   hydrOXYzine (ATARAX) 25 MG tablet, Take 25 mg by mouth at bedtime as needed (sleep)., Disp: , Rfl:    levothyroxine (SYNTHROID) 75 MCG tablet, Take 75 mcg by mouth every morning., Disp: , Rfl:    metoprolol succinate (TOPROL-XL) 25 MG 24 hr tablet, Take 1 tablet (25 mg total) by mouth daily., Disp: 90 tablet, Rfl: 3   Multiple Vitamin (MULTIVITAMIN WITH MINERALS) TABS tablet, Take 1 tablet by mouth daily., Disp: , Rfl:    OXYGEN, Inhale 2 L into the lungs continuous., Disp: , Rfl:    predniSONE (DELTASONE) 5 MG tablet, Take 1 tablet by mouth once daily with breakfast, Disp: 30 tablet, Rfl: 0   roflumilast (DALIRESP) 500 MCG TABS tablet, Take 1 tablet by mouth once daily, Disp: 30 tablet, Rfl: 11   rosuvastatin (CRESTOR) 40 MG tablet, Take 1 tablet (40 mg total) by mouth daily., Disp: 90 tablet, Rfl: 3   Garner Nash, DO [facility] 08/29/2021 9:06 AM

## 2021-09-03 ENCOUNTER — Ambulatory Visit (HOSPITAL_COMMUNITY): Payer: Medicare Other

## 2021-09-05 ENCOUNTER — Ambulatory Visit (HOSPITAL_COMMUNITY): Payer: Medicare Other

## 2021-09-18 ENCOUNTER — Other Ambulatory Visit: Payer: Self-pay

## 2021-09-18 ENCOUNTER — Other Ambulatory Visit: Payer: Medicare Other | Admitting: *Deleted

## 2021-09-18 DIAGNOSIS — E7800 Pure hypercholesterolemia, unspecified: Secondary | ICD-10-CM

## 2021-09-18 LAB — LIPID PANEL
Chol/HDL Ratio: 2.7 ratio (ref 0.0–4.4)
Cholesterol, Total: 158 mg/dL (ref 100–199)
HDL: 59 mg/dL (ref >39)
LDL Chol Calc (NIH): 87 mg/dL (ref 0–99)
Triglycerides: 58 mg/dL (ref 0–149)
VLDL Cholesterol Cal: 12 mg/dL (ref 5–40)

## 2021-09-18 LAB — ALT: ALT: 43 IU/L — ABNORMAL HIGH (ref 0–32)

## 2021-09-19 ENCOUNTER — Ambulatory Visit: Payer: Medicare Other | Admitting: Internal Medicine

## 2021-09-19 LAB — FUNGUS CULTURE RESULT

## 2021-09-19 LAB — FUNGUS CULTURE WITH STAIN

## 2021-09-19 LAB — FUNGAL ORGANISM REFLEX

## 2021-09-20 ENCOUNTER — Telehealth: Payer: Self-pay | Admitting: Cardiology

## 2021-09-20 MED ORDER — NEXLIZET 180-10 MG PO TABS: 1.0000 | ORAL_TABLET | Freq: Every day | ORAL | 3 refills | Status: DC

## 2021-09-20 NOTE — Telephone Encounter (Signed)
Pt returning phone call... please advise  

## 2021-09-20 NOTE — Telephone Encounter (Signed)
Supple, Harlon Flor, RPH-CPP  Antonieta Iba, RN Cc: Sueanne Margarita, MD Would confirm pt is adherent to her rosuvastatin 40mg  daily and ezetimibe 10mg  daily. If so, recommend changing ezetimibe to Nexlizet 1 tablet daily for additional 20% LDL lowering to bring LDL to goal < 70. I have already submitted prior authorization for Nexlizet which has been approved through 08/31/22.   The patient has been notified of the result and verbalized understanding.  All questions (if any) were answered. The patient confirms that she has been adherent to rosuvastatin and ezetimibe. She is in agreement to change ezetimibe to Nexlizet. Rx has been sent in.  Antonieta Iba, RN 09/20/2021 10:02 AM

## 2021-09-23 ENCOUNTER — Other Ambulatory Visit: Payer: Self-pay | Admitting: Hematology & Oncology

## 2021-09-23 ENCOUNTER — Other Ambulatory Visit: Payer: Self-pay | Admitting: Internal Medicine

## 2021-09-23 DIAGNOSIS — D649 Anemia, unspecified: Secondary | ICD-10-CM

## 2021-09-25 ENCOUNTER — Inpatient Hospital Stay: Payer: Medicare Other | Attending: Hematology & Oncology

## 2021-09-25 ENCOUNTER — Other Ambulatory Visit: Payer: Self-pay

## 2021-09-25 ENCOUNTER — Inpatient Hospital Stay (HOSPITAL_BASED_OUTPATIENT_CLINIC_OR_DEPARTMENT_OTHER): Payer: Medicare Other | Admitting: Hematology & Oncology

## 2021-09-25 VITALS — BP 129/48 | HR 74 | Temp 98.2°F | Resp 28 | Wt 99.0 lb

## 2021-09-25 DIAGNOSIS — D473 Essential (hemorrhagic) thrombocythemia: Secondary | ICD-10-CM

## 2021-09-25 DIAGNOSIS — D75839 Thrombocytosis, unspecified: Secondary | ICD-10-CM | POA: Insufficient documentation

## 2021-09-25 DIAGNOSIS — Z79899 Other long term (current) drug therapy: Secondary | ICD-10-CM | POA: Diagnosis not present

## 2021-09-25 DIAGNOSIS — F17200 Nicotine dependence, unspecified, uncomplicated: Secondary | ICD-10-CM | POA: Insufficient documentation

## 2021-09-25 DIAGNOSIS — D649 Anemia, unspecified: Secondary | ICD-10-CM | POA: Diagnosis not present

## 2021-09-25 DIAGNOSIS — R911 Solitary pulmonary nodule: Secondary | ICD-10-CM | POA: Diagnosis not present

## 2021-09-25 LAB — CBC WITH DIFFERENTIAL (CANCER CENTER ONLY)
Abs Immature Granulocytes: 0.04 10*3/uL (ref 0.00–0.07)
Basophils Absolute: 0 10*3/uL (ref 0.0–0.1)
Basophils Relative: 1 %
Eosinophils Absolute: 0 10*3/uL (ref 0.0–0.5)
Eosinophils Relative: 1 %
HCT: 41.1 % (ref 36.0–46.0)
Hemoglobin: 13.3 g/dL (ref 12.0–15.0)
Immature Granulocytes: 1 %
Lymphocytes Relative: 17 %
Lymphs Abs: 1.2 10*3/uL (ref 0.7–4.0)
MCH: 30.6 pg (ref 26.0–34.0)
MCHC: 32.4 g/dL (ref 30.0–36.0)
MCV: 94.7 fL (ref 80.0–100.0)
Monocytes Absolute: 0.5 10*3/uL (ref 0.1–1.0)
Monocytes Relative: 6 %
Neutro Abs: 5.4 10*3/uL (ref 1.7–7.7)
Neutrophils Relative %: 74 %
Platelet Count: 406 10*3/uL — ABNORMAL HIGH (ref 150–400)
RBC: 4.34 MIL/uL (ref 3.87–5.11)
RDW: 15.1 % (ref 11.5–15.5)
WBC Count: 7.1 10*3/uL (ref 4.0–10.5)
nRBC: 0 % (ref 0.0–0.2)

## 2021-09-25 LAB — CMP (CANCER CENTER ONLY)
ALT: 34 U/L (ref 0–44)
AST: 33 U/L (ref 15–41)
Albumin: 4.3 g/dL (ref 3.5–5.0)
Alkaline Phosphatase: 58 U/L (ref 38–126)
Anion gap: 6 (ref 5–15)
BUN: 16 mg/dL (ref 8–23)
CO2: 33 mmol/L — ABNORMAL HIGH (ref 22–32)
Calcium: 10 mg/dL (ref 8.9–10.3)
Chloride: 100 mmol/L (ref 98–111)
Creatinine: 0.82 mg/dL (ref 0.44–1.00)
GFR, Estimated: >60 mL/min (ref >60)
Glucose, Bld: 104 mg/dL — ABNORMAL HIGH (ref 70–99)
Potassium: 4.4 mmol/L (ref 3.5–5.1)
Sodium: 139 mmol/L (ref 135–145)
Total Bilirubin: 0.5 mg/dL (ref 0.3–1.2)
Total Protein: 7 g/dL (ref 6.5–8.1)

## 2021-09-25 LAB — SAVE SMEAR(SSMR), FOR PROVIDER SLIDE REVIEW

## 2021-09-25 LAB — LACTATE DEHYDROGENASE: LDH: 163 U/L (ref 98–192)

## 2021-09-25 MED ORDER — HYDROXYUREA 500 MG PO CAPS: ORAL_CAPSULE | ORAL | 6 refills | Status: DC

## 2021-09-25 NOTE — Progress Notes (Signed)
Hematology and Oncology Follow Up Visit  Cheryl Haley [id number] 03-30-1956 66 y.o. 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  He is here every 6 months.  She is doing quite nicely.  She is gotten over all of the COVID that she had back in July.  Her iron studies that we did back in July showed a ferritin of 120 with iron saturation of 32%.  She is doing well with the Hydrea.  She is taking 500 mg every other day.  This works well for her.  She continues on her oxygen.  Of note, she did have a biopsy of a lung nodule.  This was back in December.  Thankfully, the cytology report did not show any evidence of malignancy.  She was a heavy smoker.  Her appetite is good.  She did well over the holiday season.  Overall, I would say performance status is ECOG 2.       Medications:  Allergies as of 09/25/2021   No Known Allergies      Medication List        Accurate as of September 25, 2021 12:08 PM. If you have any questions, ask your nurse or doctor.          albuterol 108 (90 Base) MCG/ACT inhaler Commonly known as: VENTOLIN HFA Inhale 2 puffs into the lungs every 6 (six) hours as needed for wheezing or shortness of breath.   alendronate 70 MG tablet Commonly known as: FOSAMAX Take 70 mg by mouth every Saturday.   aspirin EC 81 MG tablet Take 81 mg daily by mouth.   Breztri Aerosphere 160-9-4.8 MCG/ACT Aero Generic drug: Budeson-Glycopyrrol-Formoterol INHALE 2 PUFFS IN THE MORNING AND AT BEDTIME What changed: See the new instructions.   Daliresp 500 MCG Tabs tablet Generic drug: roflumilast Take 1 tablet by mouth once daily   feeding supplement Liqd Take 237 mLs by mouth 2 (two) times daily between meals. What changed: when to take this   hydroxyurea 500  MG capsule Commonly known as: HYDREA TAKE 1 CAPSULE BY MOUTH EVERY OTHER DAY TAKE WITH FOOD TO MINIMIZE GI SIDE EFFECTS   hydrOXYzine 25 MG tablet Commonly known as: ATARAX Take 25 mg by mouth at bedtime as needed (sleep).   levothyroxine 75 MCG tablet Commonly known as: SYNTHROID Take 75 mcg by mouth every morning.   metoprolol succinate 25 MG 24 hr tablet Commonly known as: TOPROL-XL Take 1 tablet (25 mg total) by mouth daily.   multivitamin with minerals Tabs tablet Take 1 tablet by mouth daily.   Nexlizet 180-10 MG Tabs Generic drug: Bempedoic Acid-Ezetimibe Take 1 tablet by mouth daily.   OXYGEN Inhale 2 L into the lungs continuous.   predniSONE 5 MG tablet Commonly known as: DELTASONE Take 1 tablet by mouth once daily with breakfast   rosuvastatin 40 MG tablet Commonly known as: CRESTOR Take 1 tablet (40 mg total) by mouth daily.        Allergies: No Known Allergies  Past Medical History, Surgical history, Social history, and Family History were reviewed and updated.  Review of Systems: All other 10 point review of systems is negative.   Physical Exam:  weight is 99 lb (44.9 kg). Her oral temperature is 98.2 F (36.8 C). Her blood pressure is 129/48 (abnormal)  and her pulse is 74. Her respiration is 28 (abnormal) and oxygen saturation is 98%.   Wt Readings from Last 3 Encounters:  09/25/21 99 lb (44.9 kg)  08/29/21 97 lb 9.6 oz (44.3 kg)  08/20/21 95 lb (43.1 kg)    Ocular: Sclerae unicteric, pupils equal, round and reactive to light Ear-nose-throat: Oropharynx clear, dentition fair Lymphatic: No cervical or supraclavicular adenopathy Lungs no rales or rhonchi, good excursion bilaterally Heart regular rate and rhythm, no murmur appreciated Abd soft, nontender, positive bowel sounds MSK no focal spinal tenderness, no joint edema Neuro: non-focal, well-oriented, appropriate affect Breasts: Deferred   Lab Results  Component Value Date   WBC 7.1  09/25/2021   HGB 13.3 09/25/2021   HCT 41.1 09/25/2021   MCV 94.7 09/25/2021   PLT 406 (H) 09/25/2021   Lab Results  Component Value Date   FERRITIN 120 03/26/2021   IRON 105 03/26/2021   TIBC 330 03/26/2021   UIBC 226 03/26/2021   IRONPCTSAT 32 03/26/2021   Lab Results  Component Value Date   RBC 4.34 09/25/2021   No results found for: KPAFRELGTCHN, LAMBDASER, KAPLAMBRATIO No results found for: Kandis Cocking, IGMSERUM Lab Results  Component Value Date   TOTALPROTELP 6.8 09/23/2018   ALBUMINELP 3.9 09/23/2018   A1GS 0.1 09/23/2018   A2GS 0.9 09/23/2018   BETS 0.8 09/23/2018   GAMS 1.0 09/23/2018   MSPIKE Not Observed 09/23/2018   SPEI Comment 09/23/2018     Chemistry      Component Value Date/Time   NA 139 09/25/2021 1013   K 4.4 09/25/2021 1013   CL 100 09/25/2021 1013   CO2 33 (H) 09/25/2021 1013   BUN 16 09/25/2021 1013   CREATININE 0.82 09/25/2021 1013      Component Value Date/Time   CALCIUM 10.0 09/25/2021 1013   ALKPHOS 58 09/25/2021 1013   AST 33 09/25/2021 1013   ALT 34 09/25/2021 1013   BILITOT 0.5 09/25/2021 1013       Impression and Plan: Cheryl Haley is a very pleasant 66 yo female with essential thrombocythemia, JAK2 V617F+; high risk by IPSET.   She is tolerating Hydrea every other day and aspirin daily nicely.   [name]  I suppose that she is at risk for lung cancer because of this tobacco history.  She is being followed by pulmonary medicine closely.  Her platelet count is doing fine.  I will get a blood smear under the microscope.  I do not see anything that looked suspicious for any type of disease activity or disease progression.  We will still plan to get her back in 6 months.    Volanda Napoleon, MD 1/25/202312:08 PM

## 2021-10-21 LAB — ACID FAST CULTURE WITH REFLEXED SENSITIVITIES (MYCOBACTERIA): Acid Fast Culture: NEGATIVE

## 2021-10-31 ENCOUNTER — Other Ambulatory Visit: Payer: Medicare Other

## 2022-01-03 ENCOUNTER — Other Ambulatory Visit: Payer: Self-pay | Admitting: Internal Medicine

## 2022-01-10 ENCOUNTER — Inpatient Hospital Stay (HOSPITAL_BASED_OUTPATIENT_CLINIC_OR_DEPARTMENT_OTHER)
Admission: EM | Admit: 2022-01-10 | Discharge: 2022-01-13 | DRG: 190 | Disposition: A | Discharge status: Home / Self Care | Payer: Medicare Other | Attending: Internal Medicine | Admitting: Internal Medicine

## 2022-01-10 ENCOUNTER — Encounter (HOSPITAL_BASED_OUTPATIENT_CLINIC_OR_DEPARTMENT_OTHER): Payer: Self-pay | Admitting: Radiology

## 2022-01-10 ENCOUNTER — Other Ambulatory Visit: Payer: Self-pay

## 2022-01-10 ENCOUNTER — Emergency Department (HOSPITAL_BASED_OUTPATIENT_CLINIC_OR_DEPARTMENT_OTHER): Payer: Medicare Other

## 2022-01-10 DIAGNOSIS — E039 Hypothyroidism, unspecified: Secondary | ICD-10-CM | POA: Diagnosis not present

## 2022-01-10 DIAGNOSIS — I700 Atherosclerosis of aorta: Secondary | ICD-10-CM | POA: Diagnosis present

## 2022-01-10 DIAGNOSIS — Z8673 Personal history of transient ischemic attack (TIA), and cerebral infarction without residual deficits: Secondary | ICD-10-CM

## 2022-01-10 DIAGNOSIS — J440 Chronic obstructive pulmonary disease with acute lower respiratory infection: Secondary | ICD-10-CM | POA: Diagnosis not present

## 2022-01-10 DIAGNOSIS — Z888 Allergy status to other drugs, medicaments and biological substances status: Secondary | ICD-10-CM | POA: Diagnosis not present

## 2022-01-10 DIAGNOSIS — R636 Underweight: Secondary | ICD-10-CM | POA: Diagnosis present

## 2022-01-10 DIAGNOSIS — Z79899 Other long term (current) drug therapy: Secondary | ICD-10-CM | POA: Diagnosis not present

## 2022-01-10 DIAGNOSIS — J449 Chronic obstructive pulmonary disease, unspecified: Secondary | ICD-10-CM | POA: Diagnosis present

## 2022-01-10 DIAGNOSIS — J441 Chronic obstructive pulmonary disease with (acute) exacerbation: Principal | ICD-10-CM

## 2022-01-10 DIAGNOSIS — I10 Essential (primary) hypertension: Secondary | ICD-10-CM | POA: Diagnosis present

## 2022-01-10 DIAGNOSIS — Z7983 Long term (current) use of bisphosphonates: Secondary | ICD-10-CM

## 2022-01-10 DIAGNOSIS — D473 Essential (hemorrhagic) thrombocythemia: Secondary | ICD-10-CM | POA: Diagnosis present

## 2022-01-10 DIAGNOSIS — Z8249 Family history of ischemic heart disease and other diseases of the circulatory system: Secondary | ICD-10-CM

## 2022-01-10 DIAGNOSIS — Z681 Body mass index (BMI) 19 or less, adult: Secondary | ICD-10-CM

## 2022-01-10 DIAGNOSIS — R911 Solitary pulmonary nodule: Secondary | ICD-10-CM | POA: Diagnosis present

## 2022-01-10 DIAGNOSIS — J189 Pneumonia, unspecified organism: Secondary | ICD-10-CM | POA: Diagnosis not present

## 2022-01-10 DIAGNOSIS — E785 Hyperlipidemia, unspecified: Secondary | ICD-10-CM | POA: Diagnosis not present

## 2022-01-10 DIAGNOSIS — Z20822 Contact with and (suspected) exposure to covid-19: Secondary | ICD-10-CM | POA: Diagnosis present

## 2022-01-10 DIAGNOSIS — Z79890 Hormone replacement therapy (postmenopausal): Secondary | ICD-10-CM | POA: Diagnosis not present

## 2022-01-10 DIAGNOSIS — Z7951 Long term (current) use of inhaled steroids: Secondary | ICD-10-CM

## 2022-01-10 DIAGNOSIS — Z825 Family history of asthma and other chronic lower respiratory diseases: Secondary | ICD-10-CM

## 2022-01-10 DIAGNOSIS — Z7952 Long term (current) use of systemic steroids: Secondary | ICD-10-CM

## 2022-01-10 DIAGNOSIS — J9621 Acute and chronic respiratory failure with hypoxia: Secondary | ICD-10-CM | POA: Diagnosis present

## 2022-01-10 DIAGNOSIS — D638 Anemia in other chronic diseases classified elsewhere: Secondary | ICD-10-CM | POA: Diagnosis not present

## 2022-01-10 DIAGNOSIS — Z87891 Personal history of nicotine dependence: Secondary | ICD-10-CM | POA: Diagnosis not present

## 2022-01-10 DIAGNOSIS — Z7982 Long term (current) use of aspirin: Secondary | ICD-10-CM

## 2022-01-10 DIAGNOSIS — Z9981 Dependence on supplemental oxygen: Secondary | ICD-10-CM | POA: Diagnosis not present

## 2022-01-10 LAB — COMPREHENSIVE METABOLIC PANEL
ALT: 26 U/L (ref 0–44)
AST: 37 U/L (ref 15–41)
Albumin: 3.7 g/dL (ref 3.5–5.0)
Alkaline Phosphatase: 48 U/L (ref 38–126)
Anion gap: 11 (ref 5–15)
BUN: 19 mg/dL (ref 8–23)
CO2: 28 mmol/L (ref 22–32)
Calcium: 9.3 mg/dL (ref 8.9–10.3)
Chloride: 98 mmol/L (ref 98–111)
Creatinine, Ser: 0.94 mg/dL (ref 0.44–1.00)
GFR, Estimated: >60 mL/min (ref >60)
Glucose, Bld: 104 mg/dL — ABNORMAL HIGH (ref 70–99)
Potassium: 4.1 mmol/L (ref 3.5–5.1)
Sodium: 137 mmol/L (ref 135–145)
Total Bilirubin: 0.9 mg/dL (ref 0.3–1.2)
Total Protein: 7.7 g/dL (ref 6.5–8.1)

## 2022-01-10 LAB — PROCALCITONIN: Procalcitonin: <0.1 ng/mL

## 2022-01-10 LAB — CBC WITH DIFFERENTIAL/PLATELET
Abs Immature Granulocytes: 0.02 10*3/uL (ref 0.00–0.07)
Basophils Absolute: 0 10*3/uL (ref 0.0–0.1)
Basophils Relative: 1 %
Eosinophils Absolute: 0.1 10*3/uL (ref 0.0–0.5)
Eosinophils Relative: 1 %
HCT: 39.6 % (ref 36.0–46.0)
Hemoglobin: 13.2 g/dL (ref 12.0–15.0)
Immature Granulocytes: 0 %
Lymphocytes Relative: 22 %
Lymphs Abs: 1.3 10*3/uL (ref 0.7–4.0)
MCH: 30.1 pg (ref 26.0–34.0)
MCHC: 33.3 g/dL (ref 30.0–36.0)
MCV: 90.4 fL (ref 80.0–100.0)
Monocytes Absolute: 0.6 10*3/uL (ref 0.1–1.0)
Monocytes Relative: 11 %
Neutro Abs: 3.8 10*3/uL (ref 1.7–7.7)
Neutrophils Relative %: 65 %
Platelets: 444 10*3/uL — ABNORMAL HIGH (ref 150–400)
RBC: 4.38 MIL/uL (ref 3.87–5.11)
RDW: 14.9 % (ref 11.5–15.5)
WBC: 5.8 10*3/uL (ref 4.0–10.5)
nRBC: 0 % (ref 0.0–0.2)

## 2022-01-10 LAB — RESP PANEL BY RT-PCR (FLU A&B, COVID) ARPGX2
Influenza A by PCR: NEGATIVE
Influenza B by PCR: NEGATIVE
SARS Coronavirus 2 by RT PCR: NEGATIVE

## 2022-01-10 LAB — BRAIN NATRIURETIC PEPTIDE: B Natriuretic Peptide: 30.9 pg/mL (ref 0.0–100.0)

## 2022-01-10 LAB — D-DIMER, QUANTITATIVE: D-Dimer, Quant: 1 ug/mL-FEU — ABNORMAL HIGH (ref 0.00–0.50)

## 2022-01-10 MED ORDER — ALBUTEROL SULFATE (2.5 MG/3ML) 0.083% IN NEBU: 2.5000 mg | INHALATION_SOLUTION | RESPIRATORY_TRACT | Status: DC | PRN

## 2022-01-10 MED ORDER — IPRATROPIUM-ALBUTEROL 0.5-2.5 (3) MG/3ML IN SOLN
3.0000 mL | Freq: Three times a day (TID) | RESPIRATORY_TRACT | Status: DC
  Administered 2022-01-11 – 2022-01-13 (×7): 3 mL via RESPIRATORY_TRACT
  Filled 2022-01-10 (×8): qty 3

## 2022-01-10 MED ORDER — ALBUTEROL SULFATE (2.5 MG/3ML) 0.083% IN NEBU
5.0000 mg | INHALATION_SOLUTION | Freq: Once | RESPIRATORY_TRACT | Status: AC
  Administered 2022-01-10: 5 mg via RESPIRATORY_TRACT
  Filled 2022-01-10: qty 6

## 2022-01-10 MED ORDER — ORAL CARE MOUTH RINSE
15.0000 mL | Freq: Two times a day (BID) | OROMUCOSAL | Status: DC
  Administered 2022-01-11 – 2022-01-13 (×5): 15 mL via OROMUCOSAL

## 2022-01-10 MED ORDER — METHYLPREDNISOLONE SODIUM SUCC 40 MG IJ SOLR
40.0000 mg | Freq: Two times a day (BID) | INTRAMUSCULAR | Status: AC
  Administered 2022-01-10 – 2022-01-12 (×4): 40 mg via INTRAVENOUS
  Filled 2022-01-10 (×4): qty 1

## 2022-01-10 MED ORDER — SODIUM CHLORIDE 0.9 % IV SOLN
500.0000 mg | Freq: Once | INTRAVENOUS | Status: AC
  Administered 2022-01-10: 500 mg via INTRAVENOUS
  Filled 2022-01-10: qty 5

## 2022-01-10 MED ORDER — SODIUM CHLORIDE 0.9 % IV SOLN
1.0000 g | INTRAVENOUS | Status: DC
  Administered 2022-01-11 – 2022-01-13 (×3): 1 g via INTRAVENOUS
  Filled 2022-01-10 (×3): qty 10

## 2022-01-10 MED ORDER — SODIUM CHLORIDE 0.9 % IV SOLN
500.0000 mg | INTRAVENOUS | Status: DC
  Administered 2022-01-11 – 2022-01-12 (×2): 500 mg via INTRAVENOUS
  Filled 2022-01-10 (×3): qty 5

## 2022-01-10 MED ORDER — MAGNESIUM SULFATE 2 GM/50ML IV SOLN
2.0000 g | Freq: Once | INTRAVENOUS | Status: AC
  Administered 2022-01-10: 2 g via INTRAVENOUS
  Filled 2022-01-10: qty 50

## 2022-01-10 MED ORDER — MELATONIN 3 MG PO TABS
3.0000 mg | ORAL_TABLET | Freq: Every evening | ORAL | Status: DC | PRN
  Filled 2022-01-10: qty 1

## 2022-01-10 MED ORDER — IOHEXOL 350 MG/ML SOLN
100.0000 mL | Freq: Once | INTRAVENOUS | Status: AC | PRN
  Administered 2022-01-10: 75 mL via INTRAVENOUS

## 2022-01-10 MED ORDER — IPRATROPIUM-ALBUTEROL 0.5-2.5 (3) MG/3ML IN SOLN
3.0000 mL | Freq: Four times a day (QID) | RESPIRATORY_TRACT | Status: DC
  Administered 2022-01-10: 3 mL via RESPIRATORY_TRACT
  Filled 2022-01-10: qty 3

## 2022-01-10 MED ORDER — ACETAMINOPHEN 325 MG PO TABS: 650.0000 mg | ORAL_TABLET | Freq: Four times a day (QID) | ORAL | Status: DC | PRN

## 2022-01-10 MED ORDER — ENOXAPARIN SODIUM 30 MG/0.3ML IJ SOSY
30.0000 mg | PREFILLED_SYRINGE | INTRAMUSCULAR | Status: DC
  Administered 2022-01-10 – 2022-01-12 (×3): 30 mg via SUBCUTANEOUS
  Filled 2022-01-10 (×3): qty 0.3

## 2022-01-10 MED ORDER — POLYETHYLENE GLYCOL 3350 17 G PO PACK
17.0000 g | PACK | Freq: Every day | ORAL | Status: DC | PRN
Start: 2022-01-10 — End: 2022-01-13

## 2022-01-10 MED ORDER — GUAIFENESIN ER 600 MG PO TB12
1200.0000 mg | ORAL_TABLET | Freq: Two times a day (BID) | ORAL | Status: AC
  Administered 2022-01-11 – 2022-01-13 (×6): 1200 mg via ORAL
  Filled 2022-01-10 (×6): qty 2

## 2022-01-10 MED ORDER — GUAIFENESIN-DM 100-10 MG/5ML PO SYRP
5.0000 mL | ORAL_SOLUTION | ORAL | Status: DC | PRN
  Administered 2022-01-10: 5 mL via ORAL
  Filled 2022-01-10: qty 10

## 2022-01-10 MED ORDER — ONDANSETRON HCL 4 MG/2ML IJ SOLN: 4.0000 mg | Freq: Four times a day (QID) | INTRAMUSCULAR | Status: DC | PRN

## 2022-01-10 MED ORDER — SODIUM CHLORIDE 3 % IN NEBU
4.0000 mL | INHALATION_SOLUTION | Freq: Two times a day (BID) | RESPIRATORY_TRACT | Status: DC
  Administered 2022-01-11 – 2022-01-13 (×5): 4 mL via RESPIRATORY_TRACT
  Filled 2022-01-10 (×5): qty 4

## 2022-01-10 MED ORDER — METHYLPREDNISOLONE SODIUM SUCC 125 MG IJ SOLR
125.0000 mg | Freq: Once | INTRAMUSCULAR | Status: AC
  Administered 2022-01-10: 125 mg via INTRAVENOUS
  Filled 2022-01-10: qty 2

## 2022-01-10 MED ORDER — IPRATROPIUM-ALBUTEROL 0.5-2.5 (3) MG/3ML IN SOLN
3.0000 mL | RESPIRATORY_TRACT | Status: AC
  Administered 2022-01-10 (×3): 3 mL via RESPIRATORY_TRACT
  Filled 2022-01-10: qty 3
  Filled 2022-01-10: qty 6

## 2022-01-10 NOTE — ED Provider Notes (Signed)
?[facility] EMERGENCY DEPARTMENT ?Provider Note ? ? ?CSN: [id number] ?Arrival date & time: 01/10/22  0831 ? ?  ? ?History ? ?Chief Complaint  ?Patient presents with  ? Shortness of Breath  ? ? ?Cheryl Haley is a 66 y.o. female. ? ?HPI ? ?  ? ?66 year old female with a history of COPD on 2L of O2, hypertension, hyperlipidemia, hypothyroidism, pleural effusion, pulmonary nodules who presents with concern for shortness of breath. ? ?For the last 2 days has had increased dyspnea and oxygen requirement.  Oxygen saturations down to 85 just sitting up in the bed today.  Has turned home O2 up to 4L.  Oxygen levels dropping down with ambulation. Previously O2 levels 95 on 2L.  Has mild increase in cough, thinks maybe seasonal allergies, and congestion.  NO fever. Denies leg pain or swelling.   ? ?Past Medical History:  ?Diagnosis Date  ? Agatston coronary artery calcium score greater than 400   ? score 678  ? Anxiety   ? Arthritis   ? "in neck"  ? COPD (chronic obstructive pulmonary disease) ([place])   ? Dyspnea   ? Emphysema lung ([place])   ? Essential thrombocythemia ([place]) 09/15/2019  ? History of hiatal hernia   ? Hx of TIA (transient ischemic attack) and stroke   ? Hyperlipidemia   ? Hypertension   ? Hypothyroidism   ? Impaired fasting glucose   ? Pleural effusion 05/15/2014  ? CXR 05/2014:  New small right effusion.    ? Pneumonia   ? Protein-calorie malnutrition, severe 09/17/2018  ? Pulmonary nodules 07/14/2018  ? 07/2017 CT chest >pulmonary nodules noted.  07/2017 PET scan -Hypermetabolic LUL nodule  10/1538 CT chest >decreased nodule size, new pulmonary nodules noted.  FOB -atypical cells  CT chest 07/2018 >stable nodules to smaller on established nodules . 2 new nodules RUL , RLL >CT chest 3 months   ? Thyroid disease   ? Tobacco abuse 11/28/2015  ? Smoked 2 packs of cigarettes daily from teenage years until age 2, quit in 2016   ? Unspecified hypothyroidism 09/20/2013  ? Viral respiratory infection 09/16/2018   ?  ?Home Medications ?Prior to Admission medications   ?Medication Sig Start Date End Date Taking? Authorizing Provider  ?albuterol (VENTOLIN HFA) 108 (90 Base) MCG/ACT inhaler Inhale 2 puffs into the lungs every 6 (six) hours as needed for wheezing or shortness of breath. 09/14/20   Lauraine Rinne, NP  ?alendronate (FOSAMAX) 70 MG tablet Take 70 mg by mouth every Saturday. 06/13/21   [provider]  ?aspirin EC 81 MG tablet Take 81 mg daily by mouth.     [provider]  ?Bempedoic Acid-Ezetimibe (NEXLIZET) 180-10 MG TABS Take 1 tablet by mouth daily. 09/20/21   Sueanne Margarita, MD  ?Budeson-Glycopyrrol-Formoterol (BREZTRI AEROSPHERE) 160-9-4.8 MCG/ACT AERO INHALE 2 PUFFS INTO LUNGS IN THE MORNING AND AT BEDTIME 01/03/22   Brand Males, MD  ?feeding supplement (ENSURE ENLIVE / ENSURE PLUS) LIQD Take 237 mLs by mouth 2 (two) times daily between meals. ?Patient taking differently: Take 237 mLs by mouth daily. 08/28/20   Debbe Odea, MD  ?hydroxyurea (HYDREA) 500 MG capsule TAKE 1 CAPSULE BY MOUTH EVERY OTHER DAY TAKE WITH FOOD TO MINIMIZE GI SIDE EFFECTS 09/25/21   Volanda Napoleon, MD  ?hydrOXYzine (ATARAX) 25 MG tablet Take 25 mg by mouth at bedtime as needed (sleep).    [provider]  ?levothyroxine (SYNTHROID) 75 MCG tablet Take 75 mcg by mouth every morning.  04/24/21   [provider]  ?metoprolol succinate (TOPROL-XL) 25 MG 24 hr tablet Take 1 tablet (25 mg total) by mouth daily. 07/16/21   Sueanne Margarita, MD  ?Multiple Vitamin (MULTIVITAMIN WITH MINERALS) TABS tablet Take 1 tablet by mouth daily.    [provider]  ?OXYGEN Inhale 2 L into the lungs continuous.    [provider]  ?predniSONE (DELTASONE) 5 MG tablet Take 1 tablet by mouth once daily with breakfast 09/23/21   Brand Males, MD  ?roflumilast (DALIRESP) 500 MCG TABS tablet Take 1 tablet by mouth once daily 06/11/21   Brand Males, MD  ?rosuvastatin (CRESTOR) 40 MG tablet Take 1  tablet (40 mg total) by mouth daily. 07/16/21   Sueanne Margarita, MD  ?   ? ?Allergies    ?Patient has no known allergies.   ? ?Review of Systems   ?Review of Systems ? ?Physical Exam ?Updated Vital Signs ?BP (!) 90/49   Pulse 93   Temp 98.7 ?F (37.1 ?C) (Oral)   Resp (!) 24   Ht '5\' 4"'$  (1.626 m)   Wt 42.6 kg   SpO2 95%   BMI 16.14 kg/m?  ?Physical Exam ?Vitals and nursing note reviewed.  ?Constitutional:   ?   General: She is not in acute distress. ?   Appearance: She is well-developed and underweight. She is not diaphoretic.  ?HENT:  ?   Head: Normocephalic and atraumatic.  ?Eyes:  ?   Conjunctiva/sclera: Conjunctivae normal.  ?Cardiovascular:  ?   Rate and Rhythm: Normal rate and regular rhythm.  ?   Heart sounds: Normal heart sounds. No murmur heard. ?  No friction rub. No gallop.  ?Pulmonary:  ?   Effort: Pulmonary effort is normal. Tachypnea present. No respiratory distress.  ?   Breath sounds: Decreased breath sounds (diminished throughout) present. No wheezing or rales.  ?Abdominal:  ?   General: There is no distension.  ?   Palpations: Abdomen is soft.  ?   Tenderness: There is no abdominal tenderness. There is no guarding.  ?Musculoskeletal:     ?   General: No tenderness.  ?   Cervical back: Normal range of motion.  ?Skin: ?   General: Skin is warm and dry.  ?   Findings: No erythema or rash.  ?Neurological:  ?   Mental Status: She is alert and oriented to person, place, and time.  ? ? ?ED Results / Procedures / Treatments   ?Labs ?(all labs ordered are listed, but only abnormal results are displayed) ?Labs Reviewed  ?CBC WITH DIFFERENTIAL/PLATELET - Abnormal; Notable for the following components:  ?    Result Value  ? Platelets 444 (*)   ? All other components within normal limits  ?COMPREHENSIVE METABOLIC PANEL - Abnormal; Notable for the following components:  ? Glucose, Bld 104 (*)   ? All other components within normal limits  ?D-DIMER, QUANTITATIVE - Abnormal; Notable for the following  components:  ? D-Dimer, Quant 1.00 (*)   ? All other components within normal limits  ?RESP PANEL BY RT-PCR (FLU A&B, COVID) [id number]  ?BRAIN NATRIURETIC PEPTIDE  ?PROCALCITONIN  ? ? ?EKG ?EKG Interpretation ? ?Date/Time:  Friday Jan 10 2022 08:46:52 EDT ?Ventricular Rate:  102 ?PR Interval:  131 ?QRS Duration: 101 ?QT Interval:  350 ?QTC Calculation: 456 ?R Axis:   86 ?Text Interpretation: Sinus tachycardia Right atrial enlargement Borderline right axis deviation Abnormal R-wave progression, late transition Probable left ventricular hypertrophy TW more prominent? V3,  otherwise similar to prior Confirmed by Gareth Morgan [phone]) on 01/10/2022 10:10:53 AM ? ?Radiology ?CT Angio Chest PE W and/or Wo Contrast ? ?Result Date: 01/10/2022 ?CLINICAL DATA:  Positive D-dimer pulmonary embolus suspected. Shortness of breath x2 days. * Tracking Code: BO * EXAM: CT ANGIOGRAPHY CHEST WITH CONTRAST TECHNIQUE: Multidetector CT imaging of the chest was performed using the standard protocol during bolus administration of intravenous contrast. Multiplanar CT image reconstructions and MIPs were obtained to evaluate the vascular anatomy. RADIATION DOSE REDUCTION: This exam was performed according to the departmental dose-optimization program which includes automated exposure control, adjustment of the mA and/or kV according to patient size and/or use of iterative reconstruction technique. CONTRAST:  63m OMNIPAQUE IOHEXOL 350 MG/ML SOLN COMPARISON:  Chest CT August 12, 2021. FINDINGS: Cardiovascular: Satisfactory opacification of the pulmonary arteries to the segmental level. No evidence of pulmonary embolism. Aortic atherosclerosis without aneurysmal dilation or aortic dissection. Normal heart size. No significant pericardial effusion/thickening. Mediastinum/Nodes: No suspicious thyroid nodule. No pathologically enlarged mediastinal, hilar or axillary lymph nodes. The esophagus is grossly unremarkable. Lungs/Pleura: Consolidative  airspace disease in the posterior right upper lobe is difficult to measure and overall appears similar to minimally progressed from prior with differences in measurement possibly reflecting orthogonal plane 3.0 x 4.6 cm

## 2022-01-10 NOTE — H&P (Addendum)
?History and Physical ? ?Cheryl Haley LDJ:[id number] DOB: 27-Dec-1955 DOA: 01/10/2022 ? ?Referring physician: Accepted by Dr.  Marylyn Ishihara, [facility], Hospitalist service. ?PCP: [facility] [place]  ?Outpatient Specialists: Pulmonary. ?Patient coming from: From home through [facility] ED ? ?Chief Complaint: Shortness of breath. ? ?HPI: Cheryl Haley is a 66 y.o. female with medical history significant for COPD, chronic hypoxia on 2 L nasal cannula at baseline, hypothyroidism, essential hypertension, hyperlipidemia, essential thrombocythemia on hydroxyurea, who presented to [facility] telemetry unit as a direct transfer from [facility] ED due to gradually worsening shortness of breath of 2 days duration.  No chest pain, no productive cough.  Nonexertional dyspnea associated with hypoxia and increased oxygen requirement from 2 L to 3-4 L nasal cannula to maintain O2 saturation greater than 92%.  Her dyspnea is worse with ambulation.  Last pulmonary appointment was on January 2023.  No changes in medication.  Denies any exposure to secondhand smoking, she quit smoking about 2 years ago.  States she has a lot of allergies.  In the ED, work-up revealed CT angio chest negative for acute pulmonary embolism, revealed concern for mucoid impaction, possible right upper lobe pneumonia, and bilateral pulmonary nodules.  CT scan also notable for aortic atherosclerosis and emphysema.  Patient received albuterol nebulizer treatment and IV magnesium in the ED.  [facility], hospitalist team, was asked to admit.  Accepted by Dr. Marylyn Ishihara, [facility]. ? ?ED Course: Tmax 98.8.  BP 117/61, pulse 82, respiratory rate 22, lab studies remarkable for platelet count 444. ? ?Review of Systems: ?Review of systems as noted in the HPI. All other systems reviewed and are negative. ? ? ?Past Medical History:  ?Diagnosis Date  ? Agatston coronary artery calcium score greater than 400   ? score 678  ? Anxiety   ? Arthritis   ? "in neck"  ? COPD (chronic obstructive  pulmonary disease) ([place])   ? Dyspnea   ? Emphysema lung ([place])   ? Essential thrombocythemia ([place]) 09/15/2019  ? History of hiatal hernia   ? Hx of TIA (transient ischemic attack) and stroke   ? Hyperlipidemia   ? Hypertension   ? Hypothyroidism   ? Impaired fasting glucose   ? Pleural effusion 05/15/2014  ? CXR 05/2014:  New small right effusion.    ? Pneumonia   ? Protein-calorie malnutrition, severe 09/17/2018  ? Pulmonary nodules 07/14/2018  ? 07/2017 CT chest >pulmonary nodules noted.  07/2017 PET scan -Hypermetabolic LUL nodule  0/3009 CT chest >decreased nodule size, new pulmonary nodules noted.  FOB -atypical cells  CT chest 07/2018 >stable nodules to smaller on established nodules . 2 new nodules RUL , RLL >CT chest 3 months   ? Thyroid disease   ? Tobacco abuse 11/28/2015  ? Smoked 2 packs of cigarettes daily from teenage years until age 40, quit in 2016   ? Unspecified hypothyroidism 09/20/2013  ? Viral respiratory infection 09/16/2018  ? ?Past Surgical History:  ?Procedure Laterality Date  ? BRONCHIAL BIOPSY  08/20/2021  ? Procedure: BRONCHIAL BIOPSIES;  Surgeon: Garner Nash, DO;  Location: [place] [facility];  Service: Pulmonary;;  ? BRONCHIAL BRUSHINGS  08/20/2021  ? Procedure: BRONCHIAL BRUSHINGS;  Surgeon: Garner Nash, DO;  Location: [place] ENDOSCOPY;  Service: Pulmonary;;  ? BRONCHIAL NEEDLE ASPIRATION BIOPSY  08/20/2021  ? Procedure: BRONCHIAL NEEDLE ASPIRATION BIOPSIES;  Surgeon: Garner Nash, DO;  Location: [facility];  Service: Pulmonary;;  ? BRONCHIAL WASHINGS  08/20/2021  ? Procedure: BRONCHIAL WASHINGS;  Surgeon: June Leap  L, DO;  Location: [facility];  Service: Pulmonary;;  ? ESOPHAGOGASTRODUODENOSCOPY    ? EYE SURGERY    ? FIDUCIAL MARKER PLACEMENT  08/20/2021  ? Procedure: FIDUCIAL MARKER PLACEMENT;  Surgeon: Garner Nash, DO;  Location: [place] [facility];  Service: Pulmonary;;  ? VIDEO BRONCHOSCOPY WITH ENDOBRONCHIAL NAVIGATION Left 07/29/2017  ? Procedure: VIDEO BRONCHOSCOPY  WITH ENDOBRONCHIAL NAVIGATION;  Surgeon: Collene Gobble, MD;  Location: [facility];  Service: Thoracic;  Laterality: Left;  ? VIDEO BRONCHOSCOPY WITH RADIAL ENDOBRONCHIAL ULTRASOUND  08/20/2021  ? Procedure: RADIAL ENDOBRONCHIAL ULTRASOUND;  Surgeon: Garner Nash, DO;  Location: [place] [facility]COPY;  Service: Pulmonary;;  ? ? ?Social History:  reports that she quit smoking about 4 years ago. Her smoking use included cigarettes. She has a 80.00 pack-year smoking history. She has never used smokeless tobacco. She reports that she does not currently use alcohol. She reports that she does not use drugs. ? ? ?Allergies  ?Allergen Reactions  ? Bisoprolol Fumarate   ?  Other reaction(s): heart issues  ? ? ?Family History  ?Problem Relation Age of Onset  ? Emphysema Father   ? Heart disease Father   ? Emphysema Brother   ? Emphysema Sister   ? Emphysema Sister   ? Heart disease Mother   ? Heart disease Sister   ? Heart disease Brother   ? Heart disease Brother   ? Congestive Heart Failure Sister   ? Congestive Heart Failure Sister   ? Cancer Sister   ?     lung  ? Cancer Sister   ?     breast  ?  ? ? ?Prior to Admission medications   ?Medication Sig Start Date End Date Taking? Authorizing Provider  ?albuterol (VENTOLIN HFA) 108 (90 Base) MCG/ACT inhaler Inhale 2 puffs into the lungs every 6 (six) hours as needed for wheezing or shortness of breath. 09/14/20  Yes Lauraine Rinne, NP  ?alendronate (FOSAMAX) 70 MG tablet Take 70 mg by mouth every Saturday. 06/13/21  Yes [provider]  ?aspirin EC 81 MG tablet Take 81 mg daily by mouth.    Yes [provider]  ?Bempedoic Acid-Ezetimibe (NEXLIZET) 180-10 MG TABS Take 1 tablet by mouth daily. 09/20/21  Yes Turner, Eber Hong, MD  ?Budeson-Glycopyrrol-Formoterol (BREZTRI AEROSPHERE) 160-9-4.8 MCG/ACT AERO INHALE 2 PUFFS INTO LUNGS IN THE MORNING AND AT BEDTIME ?Patient taking differently: Inhale 2 puffs into the lungs 2 (two) times daily. 01/03/22  Yes Brand Males, MD   ?diphenhydrAMINE (BENADRYL) 25 MG tablet Take 25 mg by mouth every 6 (six) hours as needed for allergies.   Yes [provider]  ?feeding supplement (ENSURE ENLIVE / ENSURE PLUS) LIQD Take 237 mLs by mouth 2 (two) times daily between meals. ?Patient taking differently: Take 237 mLs by mouth daily. 08/28/20  Yes Debbe Odea, MD  ?hydroxyurea (HYDREA) 500 MG capsule TAKE 1 CAPSULE BY MOUTH EVERY OTHER DAY TAKE WITH FOOD TO MINIMIZE GI SIDE EFFECTS ?Patient taking differently: Take 500 mg by mouth every other day. 09/25/21  Yes Volanda Napoleon, MD  ?hydrOXYzine (ATARAX) 25 MG tablet Take 25 mg by mouth at bedtime as needed (sleep).   Yes [provider]  ?levothyroxine (SYNTHROID) 75 MCG tablet Take 75 mcg by mouth every morning. 04/24/21  Yes [provider]  ?metoprolol succinate (TOPROL-XL) 25 MG 24 hr tablet Take 1 tablet (25 mg total) by mouth daily. 07/16/21  Yes Sueanne Margarita, MD  ?Multiple Vitamin (MULTIVITAMIN WITH MINERALS) TABS tablet  Take 1 tablet by mouth daily.   Yes [provider]  ?OXYGEN Inhale 2 L into the lungs continuous.   Yes [provider]  ?predniSONE (DELTASONE) 5 MG tablet Take 1 tablet by mouth once daily with breakfast ?Patient taking differently: Take 5 mg by mouth daily. 09/23/21  Yes Brand Males, MD  ?roflumilast (DALIRESP) 500 MCG TABS tablet Take 1 tablet by mouth once daily ?Patient taking differently: Take 500 mcg by mouth daily. 06/11/21  Yes Brand Males, MD  ?rosuvastatin (CRESTOR) 40 MG tablet Take 1 tablet (40 mg total) by mouth daily. 07/16/21  Yes Sueanne Margarita, MD  ? ? ?Physical Exam: ?BP 117/61 (BP Location: Right Arm)   Pulse 82   Temp (!) 97.4 ?F (36.3 ?C) (Oral)   Resp 18   Ht '5\' 4"'$  (1.626 m)   Wt 42.6 kg   SpO2 100%   BMI 16.14 kg/m?  ? ?General: 66 y.o. year-old female well developed well nourished in no acute distress.  Alert and oriented x3. ?Cardiovascular: Regular rate and rhythm with no rubs or  gallops.  No thyromegaly or JVD noted.  No lower extremity edema. 2/4 pulses in all 4 extremities. ?Respiratory: Mild rales at bases and right upper lobe.  Poor inspiratory effort. ?Abdomen: Soft nontender nondistended wi

## 2022-01-10 NOTE — ED Notes (Signed)
Patient pulled to room 4 after registration, SpO2 85% on patient concentrator @ 3 lpm, HR 124, accessory muscle use, +DOE.  Placed on 4lpm O2 in room SpO2 increased mid 90's.   ?

## 2022-01-10 NOTE — ED Triage Notes (Signed)
Pt c/o SOB x 2 days on home O2 @ 2LNC. Denies cough, no fever, no chest pain, no other symptoms. H/O COPD. ?

## 2022-01-10 NOTE — ED Notes (Signed)
Ambulated on home concentrator @ 3 LPM with walker. SPO2 92% on 2lpm [place] from wall.  Spo2 84% after ambulating approx. 44f. ?HR 124, RR 30 labored, purse lip breathing.  Returned to room placed on 4LPm [place], SpO2 now 96%, HR 101, RR 25 after 8 minute recovery. ?

## 2022-01-10 NOTE — Progress Notes (Signed)
Plan of Care Note for accepted transfer ? ? ?Patient: Cheryl Haley MRN: [id number]   DOA: 01/10/2022 ? ?Facility requesting transfer: [facility] ?Requesting Provider: Dr. Billy Fischer ?Reason for transfer: COPD exacerbation.  ?Facility course: 67 yo F w/ COPD On 2L at home. presenting with dyspnea for last 2 days. Had to turn concentrator up to 4L. Tried steroids, nebs; but she didn't improve enough. She was ambulated and her sats dropped on 3L. CTA was negative for PE, but it shows chronic consolidative disease and nodules. EDP starting zithro for COPD abx. COVID/flu negative. ? ?Plan of care: ?The patient is accepted for admission to Telemetry unit, at [facility]..  ?While holding at [facility], medical decision making responsibility remains with the EDP. Upon arrival to [facility], [facility] will assume care.  ? ?Author: ?Jonnie Finner, DO ?01/10/2022 ? ?Check [email or web address] for on-call coverage. ? ?Nursing staff, Please call [place] number on Amion as soon as patient's arrival, so appropriate admitting provider can evaluate the pt. ? ?

## 2022-01-10 NOTE — Progress Notes (Signed)
PHARMACIST - PHYSICIAN COMMUNICATION ? ?CONCERNING:  Enoxaparin (Lovenox) for DVT Prophylaxis  ? ? ?RECOMMENDATION: ?Patient was prescribed enoxaparin 40 mg q24 hours for VTE prophylaxis.  ? [name] Weights  ? 01/10/22 0841  ?Weight: 42.6 kg (94 lb)  ? ? ?Body mass index is 16.14 kg/m?. ? ?Estimated Creatinine Clearance: 39.1 mL/min (by C-G formula based on SCr of 0.94 mg/dL). ? ? ?Based on [name] patient is candidate for enoxaparin 30 mg every 24 hours based on weight < 45 kg ? ?DESCRIPTION: ?Pharmacy has adjusted enoxaparin dose per [facility] policy. ? ?Patient is now receiving enoxaparin 30 mg every 24 hours  ? ? ?Tawnya Crook, PharmD, BCPS ?Clinical Pharmacist ?01/10/2022 8:28 PM ? ? ?

## 2022-01-11 DIAGNOSIS — E785 Hyperlipidemia, unspecified: Secondary | ICD-10-CM | POA: Diagnosis present

## 2022-01-11 DIAGNOSIS — J441 Chronic obstructive pulmonary disease with (acute) exacerbation: Secondary | ICD-10-CM | POA: Diagnosis not present

## 2022-01-11 DIAGNOSIS — R911 Solitary pulmonary nodule: Secondary | ICD-10-CM | POA: Diagnosis present

## 2022-01-11 DIAGNOSIS — I700 Atherosclerosis of aorta: Secondary | ICD-10-CM | POA: Diagnosis present

## 2022-01-11 DIAGNOSIS — D473 Essential (hemorrhagic) thrombocythemia: Secondary | ICD-10-CM | POA: Diagnosis present

## 2022-01-11 DIAGNOSIS — Z825 Family history of asthma and other chronic lower respiratory diseases: Secondary | ICD-10-CM | POA: Diagnosis not present

## 2022-01-11 DIAGNOSIS — D638 Anemia in other chronic diseases classified elsewhere: Secondary | ICD-10-CM | POA: Diagnosis present

## 2022-01-11 DIAGNOSIS — Z20822 Contact with and (suspected) exposure to covid-19: Secondary | ICD-10-CM | POA: Diagnosis present

## 2022-01-11 DIAGNOSIS — I10 Essential (primary) hypertension: Secondary | ICD-10-CM | POA: Diagnosis present

## 2022-01-11 DIAGNOSIS — Z79890 Hormone replacement therapy (postmenopausal): Secondary | ICD-10-CM | POA: Diagnosis not present

## 2022-01-11 DIAGNOSIS — Z888 Allergy status to other drugs, medicaments and biological substances status: Secondary | ICD-10-CM | POA: Diagnosis not present

## 2022-01-11 DIAGNOSIS — Z7983 Long term (current) use of bisphosphonates: Secondary | ICD-10-CM | POA: Diagnosis not present

## 2022-01-11 DIAGNOSIS — Z8249 Family history of ischemic heart disease and other diseases of the circulatory system: Secondary | ICD-10-CM | POA: Diagnosis not present

## 2022-01-11 DIAGNOSIS — Z7951 Long term (current) use of inhaled steroids: Secondary | ICD-10-CM | POA: Diagnosis not present

## 2022-01-11 DIAGNOSIS — J449 Chronic obstructive pulmonary disease, unspecified: Secondary | ICD-10-CM | POA: Diagnosis present

## 2022-01-11 DIAGNOSIS — Z681 Body mass index (BMI) 19 or less, adult: Secondary | ICD-10-CM | POA: Diagnosis not present

## 2022-01-11 DIAGNOSIS — Z7982 Long term (current) use of aspirin: Secondary | ICD-10-CM | POA: Diagnosis not present

## 2022-01-11 DIAGNOSIS — Z87891 Personal history of nicotine dependence: Secondary | ICD-10-CM | POA: Diagnosis not present

## 2022-01-11 DIAGNOSIS — J9621 Acute and chronic respiratory failure with hypoxia: Secondary | ICD-10-CM | POA: Diagnosis not present

## 2022-01-11 DIAGNOSIS — J440 Chronic obstructive pulmonary disease with acute lower respiratory infection: Secondary | ICD-10-CM | POA: Diagnosis present

## 2022-01-11 DIAGNOSIS — Z8673 Personal history of transient ischemic attack (TIA), and cerebral infarction without residual deficits: Secondary | ICD-10-CM | POA: Diagnosis not present

## 2022-01-11 DIAGNOSIS — J189 Pneumonia, unspecified organism: Secondary | ICD-10-CM | POA: Diagnosis not present

## 2022-01-11 DIAGNOSIS — E039 Hypothyroidism, unspecified: Secondary | ICD-10-CM | POA: Diagnosis present

## 2022-01-11 DIAGNOSIS — Z79899 Other long term (current) drug therapy: Secondary | ICD-10-CM | POA: Diagnosis not present

## 2022-01-11 DIAGNOSIS — Z9981 Dependence on supplemental oxygen: Secondary | ICD-10-CM | POA: Diagnosis not present

## 2022-01-11 LAB — CBC WITH DIFFERENTIAL/PLATELET
Abs Immature Granulocytes: 0.03 10*3/uL (ref 0.00–0.07)
Basophils Absolute: 0 10*3/uL (ref 0.0–0.1)
Basophils Relative: 0 %
Eosinophils Absolute: 0 10*3/uL (ref 0.0–0.5)
Eosinophils Relative: 0 %
HCT: 37 % (ref 36.0–46.0)
Hemoglobin: 11.8 g/dL — ABNORMAL LOW (ref 12.0–15.0)
Immature Granulocytes: 1 %
Lymphocytes Relative: 9 %
Lymphs Abs: 0.5 10*3/uL — ABNORMAL LOW (ref 0.7–4.0)
MCH: 29.6 pg (ref 26.0–34.0)
MCHC: 31.9 g/dL (ref 30.0–36.0)
MCV: 93 fL (ref 80.0–100.0)
Monocytes Absolute: 0.4 10*3/uL (ref 0.1–1.0)
Monocytes Relative: 8 %
Neutro Abs: 4.3 10*3/uL (ref 1.7–7.7)
Neutrophils Relative %: 82 %
Platelets: 419 10*3/uL — ABNORMAL HIGH (ref 150–400)
RBC: 3.98 MIL/uL (ref 3.87–5.11)
RDW: 14.9 % (ref 11.5–15.5)
WBC: 5.2 10*3/uL (ref 4.0–10.5)
nRBC: 0 % (ref 0.0–0.2)

## 2022-01-11 LAB — COMPREHENSIVE METABOLIC PANEL
ALT: 28 U/L (ref 0–44)
AST: 37 U/L (ref 15–41)
Albumin: 3.5 g/dL (ref 3.5–5.0)
Alkaline Phosphatase: 45 U/L (ref 38–126)
Anion gap: 8 (ref 5–15)
BUN: 18 mg/dL (ref 8–23)
CO2: 28 mmol/L (ref 22–32)
Calcium: 8.6 mg/dL — ABNORMAL LOW (ref 8.9–10.3)
Chloride: 100 mmol/L (ref 98–111)
Creatinine, Ser: 0.65 mg/dL (ref 0.44–1.00)
GFR, Estimated: >60 mL/min (ref >60)
Glucose, Bld: 112 mg/dL — ABNORMAL HIGH (ref 70–99)
Potassium: 4 mmol/L (ref 3.5–5.1)
Sodium: 136 mmol/L (ref 135–145)
Total Bilirubin: 0.6 mg/dL (ref 0.3–1.2)
Total Protein: 7.3 g/dL (ref 6.5–8.1)

## 2022-01-11 LAB — HIV ANTIBODY (ROUTINE TESTING W REFLEX): HIV Screen 4th Generation wRfx: NONREACTIVE

## 2022-01-11 LAB — MAGNESIUM: Magnesium: 2.6 mg/dL — ABNORMAL HIGH (ref 1.7–2.4)

## 2022-01-11 LAB — PROCALCITONIN: Procalcitonin: <0.1 ng/mL

## 2022-01-11 LAB — STREP PNEUMONIAE URINARY ANTIGEN: Strep Pneumo Urinary Antigen: NEGATIVE

## 2022-01-11 LAB — PHOSPHORUS: Phosphorus: 3 mg/dL (ref 2.5–4.6)

## 2022-01-11 MED ORDER — ADULT MULTIVITAMIN W/MINERALS CH
1.0000 | ORAL_TABLET | Freq: Every day | ORAL | Status: DC
  Administered 2022-01-11 – 2022-01-13 (×3): 1 via ORAL
  Filled 2022-01-11 (×3): qty 1

## 2022-01-11 MED ORDER — METOPROLOL SUCCINATE ER 25 MG PO TB24
25.0000 mg | ORAL_TABLET | Freq: Every day | ORAL | Status: DC
  Administered 2022-01-11 – 2022-01-12 (×2): 25 mg via ORAL
  Filled 2022-01-11 (×2): qty 1

## 2022-01-11 MED ORDER — UMECLIDINIUM BROMIDE 62.5 MCG/ACT IN AEPB
1.0000 | INHALATION_SPRAY | Freq: Every day | RESPIRATORY_TRACT | Status: DC
  Administered 2022-01-11 – 2022-01-13 (×3): 1 via RESPIRATORY_TRACT
  Filled 2022-01-11: qty 7

## 2022-01-11 MED ORDER — FLUTICASONE FUROATE-VILANTEROL 100-25 MCG/ACT IN AEPB
1.0000 | INHALATION_SPRAY | Freq: Every day | RESPIRATORY_TRACT | Status: DC
  Administered 2022-01-11 – 2022-01-13 (×3): 1 via RESPIRATORY_TRACT
  Filled 2022-01-11: qty 28

## 2022-01-11 MED ORDER — ROFLUMILAST 500 MCG PO TABS
500.0000 ug | ORAL_TABLET | Freq: Every day | ORAL | Status: DC
  Administered 2022-01-11 – 2022-01-13 (×3): 500 ug via ORAL
  Filled 2022-01-11 (×4): qty 1

## 2022-01-11 MED ORDER — BUDESON-GLYCOPYRROL-FORMOTEROL 160-9-4.8 MCG/ACT IN AERO: 2.0000 | INHALATION_SPRAY | Freq: Two times a day (BID) | RESPIRATORY_TRACT | Status: DC

## 2022-01-11 MED ORDER — LEVOTHYROXINE SODIUM 50 MCG PO TABS
75.0000 ug | ORAL_TABLET | Freq: Every morning | ORAL | Status: DC
  Administered 2022-01-11 – 2022-01-13 (×3): 75 ug via ORAL
  Filled 2022-01-11 (×3): qty 1

## 2022-01-11 MED ORDER — HYDROXYUREA 500 MG PO CAPS: 500.0000 mg | ORAL_CAPSULE | ORAL | Status: DC

## 2022-01-11 MED ORDER — ENSURE ENLIVE PO LIQD
237.0000 mL | Freq: Two times a day (BID) | ORAL | Status: DC
  Administered 2022-01-11 – 2022-01-13 (×5): 237 mL via ORAL

## 2022-01-11 MED ORDER — ROSUVASTATIN CALCIUM 20 MG PO TABS
40.0000 mg | ORAL_TABLET | Freq: Every day | ORAL | Status: DC
  Administered 2022-01-11 – 2022-01-13 (×3): 40 mg via ORAL
  Filled 2022-01-11 (×3): qty 2

## 2022-01-11 MED ORDER — ASPIRIN EC 81 MG PO TBEC
81.0000 mg | DELAYED_RELEASE_TABLET | Freq: Every day | ORAL | Status: DC
Start: 2022-01-11 — End: 2022-01-13
  Administered 2022-01-11 – 2022-01-13 (×3): 81 mg via ORAL
  Filled 2022-01-11 (×3): qty 1

## 2022-01-11 MED ORDER — BEMPEDOIC ACID-EZETIMIBE 180-10 MG PO TABS: 1.0000 | ORAL_TABLET | Freq: Every day | ORAL | Status: DC

## 2022-01-11 NOTE — Progress Notes (Signed)
PHARMACY - HYDROXYUREA ? ?Patient on Hydroxyurea '500mg'$  po every other day for essential thrombocythemia.  Last reported dose was on 01/10/2022. ? ?Hydroxyurea reordered on admission. ? ?Per protocol, checklist for continuing oral chemotherapy medications reviewed: ?If fever, other signs of infection, or any of the medication-specific conditions are identified, the medication will be held until physician review occurs per the oral chemotherapy policy. ? ?Patient admitted with COPD exacerbation with concern for early right upper lobe pneumonia.  Patient started on IV antibiotics. ? ?Per policy, at this time Hydroxyurea order has been held until further evaluated for appropriateness to continue by physician. ? ?Leone Haven, PharmD ?01/11/2022 @ 02:57 ? ?

## 2022-01-11 NOTE — Progress Notes (Signed)
?PROGRESS NOTE ? ? ? ?Cheryl Haley  FGH:[id number] DOB: Oct 22, 1955 DOA: 01/10/2022 ?PCP: [facility] [place]  ? ? ? ?Brief Narrative:  ? ? COPD, chronic hypoxia on 2 L nasal cannula at baseline, hypothyroidism, essential hypertension, hyperlipidemia, essential thrombocythemia on hydroxyurea, presents with DOE for 4 days, not able to go to the bathroom without being sob. ? ?Subjective: ? ?Reports significant dyspnea on exertion for the last 4 days, reports cough is at baseline, cough is nonproductive, denies chest pain, no edema ?No fever ? ?Assessment & Plan: ? Principal Problem: ?  COPD exacerbation ([place]) ?Active Problems: ?  COPD (chronic obstructive pulmonary disease) ([place]) ? ? ?Acute on chronic hypoxic respiratory failure/COPD exacerbation/pneumonia ?-CTA:  ?1. No evidence of acute pulmonary embolism. ?2. Consolidative airspace disease in the posterior right upper lobe ?is again difficult to measure but appears similar to minimally ?progressed in the interval with differences in measurement likely ?related to orthogonal plane now measuring 4.6 x 3.0 cm previously ?3.8 x 3.5 cm. New nodular tubular opacities along the lateral aspect ?of this consolidation are favored to reflect mucoid impaction. ?Attention on follow-up imaging suggested. ?3. Other bilateral pulmonary nodules are overall stable in the ?interval. ?4. Aortic Atherosclerosis (ICD10-I70.0) and Emphysema (ICD10-J43.9). ?-lung sounds very tight, continue iv steroids, abx ?  ? ?Essential hypertension, BP is at goal ?Resume home oral antihypertensive ?Continue to monitor vital signs ? ?Essential thrombocythemia ?Hold Hydrea for now ?  ?Hypothyroidism/hyperlipidemia ?Resume home regimen ? ? ? Body mass index is 16.14 kg/m?.. ?. ? ? ? ?I have Reviewed nursing notes, Vitals, pain scores, I/o's, Lab results and  imaging results since pt's last encounter, details please see discussion above  ?I ordered the following labs:  ?Unresulted  Labs (From admission, onward)  ? ?  Start     Ordered  ? 01/17/22 0500  Creatinine, serum  (enoxaparin (LOVENOX)    CrCl >/= 30 ml/min)  Weekly,   R     ?Comments: while on enoxaparin therapy ?  ? 01/10/22 2025  ? 01/11/22 0053  Strep pneumoniae urinary antigen  Once,   R       ? 01/11/22 0052  ? 01/11/22 0053  Legionella Pneumophila Serogp 1 Ur Ag  Once,   R       ? 01/11/22 0052  ? 01/10/22 2211  Expectorated Sputum Assessment w Gram Stain, Rflx to Resp Cult  Once,   R       ?Question:  Patient immune status  Answer:  Normal  ? 01/10/22 2210  ? ?  ?  ? ?  ? ? ? ?DVT prophylaxis: enoxaparin (LOVENOX) injection 30 mg Start: 01/10/22 2200 ? ? ?Code Status:   Code Status: Full Code ? ?Family Communication: patient ?Disposition:  ? ?Status is: Inpatient ? ?Dispo: The patient is from: home  ?             Anticipated d/c is to: home  ?             Anticipated d/c date is: TBD ? ?Antimicrobials:   ?Anti-infectives (From admission, onward)  ? ? Start     Dose/Rate Route Frequency Ordered Stop  ? 01/11/22 1300  azithromycin (ZITHROMAX) 500 mg in sodium chloride 0.9 % 250 mL IVPB       ? 500 mg ?250 mL/hr  Intravenous Every 24 hours 01/10/22 2027    ? 01/11/22 0045  cefTRIAXone (ROCEPHIN) 1 g in sodium chloride 0.9 % 100 mL IVPB       ?  1 g ?200 mL/hr over 30 Minutes Intravenous Every 24 hours 01/10/22 2345    ? 01/10/22 1230  azithromycin (ZITHROMAX) 500 mg in sodium chloride 0.9 % 250 mL IVPB       ? 500 mg ?250 mL/hr over 60 Minutes Intravenous  Once 01/10/22 1220 01/10/22 1416  ? ?  ? ? ? ? ? ? ?Objective: ?Vitals:  ? 01/11/22 0419 01/11/22 0803 01/11/22 0857 01/11/22 1311  ?BP: 111/63 132/68  92/65  ?Pulse: 79 78  87  ?Resp: 18     ?Temp: 98.4 ?F (36.9 ?C)   (!) 97.5 ?F (36.4 ?C)  ?TempSrc: Oral   Oral  ?SpO2: 96% 94% 96% 96%  ?Weight:      ?Height:      ? ? ?Intake/Output Summary (Last 24 hours) at 01/11/2022 1442 ?Last data filed at 01/11/2022 0700 ?Gross per 24 hour  ?Intake 120 ml  ?Output --  ?Net 120 ml  ? ?Filed  Weights  ? 01/10/22 0841  ?Weight: 42.6 kg  ? ? ?Examination: ? ?General exam: aaox3, appear weak, NAD, pleasant ?Respiratory system: Very tight , very poor air movement , no wheezing , no rales , no rhonchi ,respiratory effort normal at rest, reports significant dyspnea when get out of bed use the bathroom in room ?Cardiovascular system:  RRR.  ?Gastrointestinal system: Abdomen is nondistended, soft and nontender.  Normal bowel sounds heard. ?Central nervous system: Alert and oriented. No focal neurological deficits. ?Extremities:  no edema ?Skin: No rashes, lesions or ulcers ?Psychiatry: Judgement and insight appear normal. Mood & affect appropriate.  ? ? ? ?Data Reviewed: I have personally reviewed  labs and visualized  imaging studies since the last encounter and formulate the plan  ? ? ? ? ? ? ?Scheduled Meds: ? aspirin EC  81 mg Oral Daily  ? Bempedoic Acid-Ezetimibe  1 tablet Oral Daily  ? enoxaparin (LOVENOX) injection  30 mg Subcutaneous Q24H  ? feeding supplement  237 mL Oral BID BM  ? fluticasone furoate-vilanterol  1 puff Inhalation Daily  ? And  ? umeclidinium bromide  1 puff Inhalation Daily  ? guaiFENesin  1,200 mg Oral BID  ? ipratropium-albuterol  3 mL Nebulization TID  ? levothyroxine  75 mcg Oral q morning  ? mouth rinse  15 mL Mouth Rinse BID  ? methylPREDNISolone (SOLU-MEDROL) injection  40 mg Intravenous BID  ? metoprolol succinate  25 mg Oral Daily  ? multivitamin with minerals  1 tablet Oral Daily  ? roflumilast  500 mcg Oral Daily  ? rosuvastatin  40 mg Oral Daily  ? sodium chloride HYPERTONIC  4 mL Nebulization BID  ? ?Continuous Infusions: ? azithromycin (ZITHROMAX) 500 MG IVPB (Vial-Mate [employer]) 500 mg (01/11/22 1204)  ? cefTRIAXone (ROCEPHIN)  IV 1 g (01/11/22 0129)  ? ? ? LOS: 0 days  ? ? ? ?Florencia Reasons, MD PhD FACP ?[facility] ? ?Available via Epic secure chat 7am-7pm for nonurgent issues ?Please page for urgent issues ?To page the attending provider between 7A-7P or the covering  provider during after hours 7P-7A, please log into the web site [email or web address] and access using universal [place] password for that web site. If you do not have the password, please call the hospital operator. ? ? ? ?01/11/2022, 2:42 PM  ? ? ?

## 2022-01-12 ENCOUNTER — Encounter (HOSPITAL_COMMUNITY): Payer: Self-pay | Admitting: Internal Medicine

## 2022-01-12 DIAGNOSIS — J441 Chronic obstructive pulmonary disease with (acute) exacerbation: Secondary | ICD-10-CM | POA: Diagnosis not present

## 2022-01-12 LAB — CBC WITH DIFFERENTIAL/PLATELET
Abs Immature Granulocytes: 0.04 10*3/uL (ref 0.00–0.07)
Basophils Absolute: 0 10*3/uL (ref 0.0–0.1)
Basophils Relative: 0 %
Eosinophils Absolute: 0 10*3/uL (ref 0.0–0.5)
Eosinophils Relative: 0 %
HCT: 34.7 % — ABNORMAL LOW (ref 36.0–46.0)
Hemoglobin: 11.3 g/dL — ABNORMAL LOW (ref 12.0–15.0)
Immature Granulocytes: 1 %
Lymphocytes Relative: 7 %
Lymphs Abs: 0.3 10*3/uL — ABNORMAL LOW (ref 0.7–4.0)
MCH: 30.5 pg (ref 26.0–34.0)
MCHC: 32.6 g/dL (ref 30.0–36.0)
MCV: 93.5 fL (ref 80.0–100.0)
Monocytes Absolute: 0.2 10*3/uL (ref 0.1–1.0)
Monocytes Relative: 3 %
Neutro Abs: 4.3 10*3/uL (ref 1.7–7.7)
Neutrophils Relative %: 89 %
Platelets: 390 10*3/uL (ref 150–400)
RBC: 3.71 MIL/uL — ABNORMAL LOW (ref 3.87–5.11)
RDW: 14.8 % (ref 11.5–15.5)
WBC: 4.8 10*3/uL (ref 4.0–10.5)
nRBC: 0 % (ref 0.0–0.2)

## 2022-01-12 LAB — BASIC METABOLIC PANEL
Anion gap: 7 (ref 5–15)
BUN: 15 mg/dL (ref 8–23)
CO2: 29 mmol/L (ref 22–32)
Calcium: 8.6 mg/dL — ABNORMAL LOW (ref 8.9–10.3)
Chloride: 104 mmol/L (ref 98–111)
Creatinine, Ser: 0.57 mg/dL (ref 0.44–1.00)
GFR, Estimated: >60 mL/min (ref >60)
Glucose, Bld: 131 mg/dL — ABNORMAL HIGH (ref 70–99)
Potassium: 4.6 mmol/L (ref 3.5–5.1)
Sodium: 140 mmol/L (ref 135–145)

## 2022-01-12 MED ORDER — PREDNISONE 20 MG PO TABS
20.0000 mg | ORAL_TABLET | Freq: Every day | ORAL | Status: DC
  Administered 2022-01-13: 20 mg via ORAL
  Filled 2022-01-12: qty 1

## 2022-01-12 MED ORDER — METOPROLOL SUCCINATE ER 25 MG PO TB24
12.5000 mg | ORAL_TABLET | Freq: Every day | ORAL | Status: DC
  Administered 2022-01-13: 12.5 mg via ORAL
  Filled 2022-01-12: qty 1

## 2022-01-12 MED ORDER — HYDROXYUREA 500 MG PO CAPS
500.0000 mg | ORAL_CAPSULE | ORAL | Status: DC
  Administered 2022-01-12: 500 mg via ORAL
  Filled 2022-01-12: qty 1

## 2022-01-12 NOTE — Progress Notes (Signed)
?PROGRESS NOTE ? ? ? ?Cheryl Haley  IDP:[id number] DOB: Jan 09, 1956 DOA: 01/10/2022 ?PCP: [facility] [place]  ? ? ? ?Brief Narrative:  ? ? COPD, chronic hypoxia on 2 L nasal cannula at baseline, hypothyroidism, essential hypertension, hyperlipidemia, essential thrombocythemia on hydroxyurea, presents with DOE for 4 days, not able to go to the bathroom without being sob. ? ?Subjective: ? ?Feeling better today, cough start to become productive, denies chest pain , less dyspnea, though not at baseline yet ?reports significant dyspnea with minimal exertion ?No fever ?no edema ? ? ?Assessment & Plan: ? Principal Problem: ?  COPD exacerbation ([place]) ?Active Problems: ?  COPD (chronic obstructive pulmonary disease) ([place]) ? ? 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 ?Known history of  lung nodules: follows Dr IValeta Harms?CTA shows "Consolidative airspace disease in the posterior right upper lobe is again difficult to measure but appears similar to minimally progressed in the interval with differences in measurement likely related to orthogonal plane now measuring 4.6 x 3.0 cm previously 3.8 x 3.5 cm. New nodular tubular opacities along the lateral aspect of this consolidation are favored to reflect mucoid impaction. Attention on follow-up imaging suggested. ?"Other bilateral pulmonary nodules are overall stable in the interval." ? ? ?  ? ?Essential hypertension ?States BP tends to be low while in the hospital but  sbp will be around 130 at home ?For now decrease metoprolol to 12.5 mg daily, plan to resume home dose at discharge ?Hyperlipidemia:  Continue statin ?Aortic Atherosclerosis: Seen on CT scan, continue aspirin and statin ? ?Essential thrombocythemia ?Resume Hydrea , plt stable  ?  ?Hypothyroidism ?Resume home regimen ? ?Underweight ? Body mass index is 16.14 kg/m?.. ?.  Encourage nutrition ? ? ? ? ?I have Reviewed nursing notes, Vitals, pain scores, I/o's, Lab results and  imaging results since pt's last encounter, details please see discussion above  ?I ordered the following labs:  ?Unresulted Labs (From admission, onward)  ? ?  Start     Ordered  ? 01/17/22 0500  Creatinine, serum  (enoxaparin (LOVENOX)    CrCl >/= 30 ml/min)  Weekly,   R     ?Comments: while on enoxaparin therapy ?  ? 01/10/22 2025  ? 01/13/22 0500  CBC with Differential/Platelet  Tomorrow morning,   R       ?Question:  Specimen collection method  Answer:  Lab=Lab collect  ? 01/12/22 1135  ? 01/13/22 04431 Basic metabolic panel  Tomorrow morning,   R       ?Question:  Specimen collection method  Answer:  Lab=Lab collect  ? 01/12/22 1135  ? 01/11/22 0053  Legionella Pneumophila Serogp 1 Ur Ag  Once,   R       ? 01/11/22 0052  ? 01/10/22 2211  Expectorated Sputum Assessment w Gram Stain, Rflx to Resp Cult  Once,   R       ?Question:  Patient immune status  Answer:  Normal  ? 01/10/22 2210  ? ?  ?  ? ?  ? ? ? ?DVT prophylaxis: enoxaparin (LOVENOX) injection 30 mg Start: 01/10/22 2200 ? ? ?Code Status:   Code Status: Full Code ? ?  Family Communication: patient ?Disposition:  ? ?Status is: Inpatient ? ?Dispo: The patient is from: home  ?             Anticipated d/c is to: home  ?             Anticipated d/c date is: Monday or Tuesday pending respiratory status, taper prednisone  ? ?Antimicrobials:   ?Anti-infectives (From admission, onward)  ? ? Start     Dose/Rate Route Frequency Ordered Stop  ? 01/11/22 1300  azithromycin (ZITHROMAX) 500 mg in sodium chloride 0.9 % 250 mL IVPB       ? 500 mg ?250 mL/hr  Intravenous Every 24 hours 01/10/22 2027    ? 01/11/22 0045  cefTRIAXone  (ROCEPHIN) 1 g in sodium chloride 0.9 % 100 mL IVPB       ? 1 g ?200 mL/hr over 30 Minutes Intravenous Every 24 hours 01/10/22 2345    ? 01/10/22 1230  azithromycin (ZITHROMAX) 500 mg in sodium chloride 0.9 % 250 mL IVPB       ? 500 mg ?250 mL/hr over 60 Minutes Intravenous  Once 01/10/22 1220 01/10/22 1416  ? ?  ? ? ? ? ? ? ?Objective: ?Vitals:  ? 01/11/22 2053 01/11/22 2100 01/12/22 0529 01/12/22 0803  ?BP:   110/61   ?Pulse:   65   ?Resp:  18 20   ?Temp:   97.7 ?F (36.5 ?C)   ?TempSrc:   Oral   ?SpO2: 98%  97% 97%  ?Weight:      ?Height:      ? ? ?Intake/Output Summary (Last 24 hours) at 01/12/2022 1136 ?Last data filed at 01/12/2022 1031 ?Gross per 24 hour  ?Intake 3730 ml  ?Output 250 ml  ?Net 3480 ml  ? ?Filed Weights  ? 01/10/22 0841  ?Weight: 42.6 kg  ? ? ?Examination: ? ?General exam: thin, aaox3, appear weak, NAD, pleasant ?Respiratory system: slightly improved air movement , no wheezing , no rales , no rhonchi ,respiratory effort normal at rest, Cardiovascular system:  RRR.  ?Gastrointestinal system: Abdomen is nondistended, soft and nontender.  Normal bowel sounds heard. ?Central nervous system: Alert and oriented. No focal neurological deficits. ?Extremities:  no edema ?Skin: No rashes, lesions or ulcers ?Psychiatry: Judgement and insight appear normal. Mood & affect appropriate.  ? ? ? ?Data Reviewed: I have personally reviewed  labs and visualized  imaging studies since the last encounter and formulate the plan  ? ? ? ? ? ? ?Scheduled Meds: ? aspirin EC  81 mg Oral Daily  ? Bempedoic Acid-Ezetimibe  1 tablet Oral Daily  ? enoxaparin (LOVENOX) injection  30 mg Subcutaneous Q24H  ? feeding supplement  237 mL Oral BID BM  ? fluticasone furoate-vilanterol  1 puff Inhalation Daily  ? And  ? umeclidinium bromide  1 puff Inhalation Daily  ? guaiFENesin  1,200 mg Oral BID  ? ipratropium-albuterol  3 mL Nebulization TID  ? levothyroxine  75 mcg Oral q morning  ? mouth rinse  15 mL Mouth Rinse BID  ? metoprolol  succinate  25 mg Oral Daily  ? multivitamin with minerals  1 tablet Oral Daily  ? roflumilast  500 mcg Oral Daily  ? rosuvastatin  40 mg Oral Daily  ? sodium chloride HYPERTONIC  4 mL Nebulization BID  ? ?Continuous Infusions: ? azithromycin (ZITHROMAX) 500 MG IVPB (Vial-Mate [employer]) Stopped (01/11/22 1310)  ? cefTRIAXone (ROCEPHIN)  IV Stopped (01/12/22 1031)  ? ? ? LOS: 1  day  ? ? ? ?Florencia Reasons, MD PhD FACP ?[facility] ? ?Available via Epic secure chat 7am-7pm for nonurgent issues ?Please page for urgent issues ?To page the attending provider between 7A-7P or the covering provider during after hours 7P-7A, please log into the web site [email or web address] and access using universal [place] password for that web site. If you do not have the password, please call the hospital operator. ? ? ? ?01/12/2022, 11:36 AM  ? ? ?

## 2022-01-12 NOTE — Plan of Care (Signed)
  Problem: Activity: Goal: Risk for activity intolerance will decrease Outcome: Progressing   Problem: Nutrition: Goal: Adequate nutrition will be maintained Outcome: Progressing   Problem: Pain Managment: Goal: General experience of comfort will improve Outcome: Progressing   

## 2022-01-12 NOTE — Progress Notes (Signed)
?  Transition of Care (TOC) Screening Note ? ? ?Patient Details  ?Name: Cheryl Haley ?Date of Birth: [birth year] ? ? ?Transition of Care (TOC) CM/SW Contact:    ?Demyan Fugate, LCSW ?Phone Number: ?01/12/2022, 1:22 PM ? ? ? ?Transition of Care Department [facility]) has reviewed patient and no TOC needs have been identified at this time. We will continue to monitor patient advancement through interdisciplinary progression rounds. If new patient transition needs arise, please place a TOC consult. ? ? ?

## 2022-01-13 DIAGNOSIS — J189 Pneumonia, unspecified organism: Secondary | ICD-10-CM | POA: Diagnosis not present

## 2022-01-13 DIAGNOSIS — J9621 Acute and chronic respiratory failure with hypoxia: Secondary | ICD-10-CM | POA: Diagnosis not present

## 2022-01-13 DIAGNOSIS — J441 Chronic obstructive pulmonary disease with (acute) exacerbation: Secondary | ICD-10-CM | POA: Diagnosis not present

## 2022-01-13 LAB — CBC WITH DIFFERENTIAL/PLATELET
Abs Immature Granulocytes: 0.04 10*3/uL (ref 0.00–0.07)
Basophils Absolute: 0 10*3/uL (ref 0.0–0.1)
Basophils Relative: 0 %
Eosinophils Absolute: 0 10*3/uL (ref 0.0–0.5)
Eosinophils Relative: 0 %
HCT: 37.4 % (ref 36.0–46.0)
Hemoglobin: 11.6 g/dL — ABNORMAL LOW (ref 12.0–15.0)
Immature Granulocytes: 1 %
Lymphocytes Relative: 17 %
Lymphs Abs: 1 10*3/uL (ref 0.7–4.0)
MCH: 29.4 pg (ref 26.0–34.0)
MCHC: 31 g/dL (ref 30.0–36.0)
MCV: 94.9 fL (ref 80.0–100.0)
Monocytes Absolute: 0.4 10*3/uL (ref 0.1–1.0)
Monocytes Relative: 8 %
Neutro Abs: 4.2 10*3/uL (ref 1.7–7.7)
Neutrophils Relative %: 74 %
Platelets: 439 10*3/uL — ABNORMAL HIGH (ref 150–400)
RBC: 3.94 MIL/uL (ref 3.87–5.11)
RDW: 14.9 % (ref 11.5–15.5)
WBC: 5.6 10*3/uL (ref 4.0–10.5)
nRBC: 0 % (ref 0.0–0.2)

## 2022-01-13 LAB — BASIC METABOLIC PANEL
Anion gap: 6 (ref 5–15)
BUN: 18 mg/dL (ref 8–23)
CO2: 32 mmol/L (ref 22–32)
Calcium: 8.7 mg/dL — ABNORMAL LOW (ref 8.9–10.3)
Chloride: 102 mmol/L (ref 98–111)
Creatinine, Ser: 0.69 mg/dL (ref 0.44–1.00)
GFR, Estimated: >60 mL/min (ref >60)
Glucose, Bld: 93 mg/dL (ref 70–99)
Potassium: 4 mmol/L (ref 3.5–5.1)
Sodium: 140 mmol/L (ref 135–145)

## 2022-01-13 LAB — EXPECTORATED SPUTUM ASSESSMENT W GRAM STAIN, RFLX TO RESP C: Special Requests: NORMAL

## 2022-01-13 MED ORDER — PREDNISONE 10 MG PO TABS: ORAL_TABLET | ORAL | 0 refills | Status: DC

## 2022-01-13 MED ORDER — GUAIFENESIN ER 600 MG PO TB12: 600.0000 mg | ORAL_TABLET | Freq: Two times a day (BID) | ORAL | 0 refills | Status: AC

## 2022-01-13 MED ORDER — CEFPODOXIME PROXETIL 200 MG PO TABS: 200.0000 mg | ORAL_TABLET | Freq: Two times a day (BID) | ORAL | 0 refills | Status: AC

## 2022-01-13 MED ORDER — PREDNISONE 5 MG PO TABS: 5.0000 mg | ORAL_TABLET | Freq: Every day | ORAL | Status: DC

## 2022-01-13 NOTE — Plan of Care (Signed)
?  Problem: Education: ?Goal: Knowledge of General Education information will improve ?Description: Including pain rating scale, medication(s)/side effects and non-pharmacologic comfort measures ?01/13/2022 1202 by Jerene Pitch, RN ?Outcome: Adequate for Discharge ?01/13/2022 1201 by Jerene Pitch, RN ?Outcome: Adequate for Discharge ?  ?Problem: Health Behavior/Discharge Planning: ?Goal: Ability to manage health-related needs will improve ?01/13/2022 1202 by Jerene Pitch, RN ?Outcome: Adequate for Discharge ?01/13/2022 1201 by Jerene Pitch, RN ?Outcome: Adequate for Discharge ?  ?Problem: Clinical Measurements: ?Goal: Ability to maintain clinical measurements within normal limits will improve ?01/13/2022 1202 by Jerene Pitch, RN ?Outcome: Adequate for Discharge ?01/13/2022 1201 by Jerene Pitch, RN ?Outcome: Adequate for Discharge ?Goal: Will remain free from infection ?01/13/2022 1202 by Jerene Pitch, RN ?Outcome: Adequate for Discharge ?01/13/2022 1201 by Jerene Pitch, RN ?Outcome: Adequate for Discharge ?Goal: Diagnostic test results will improve ?01/13/2022 1202 by Jerene Pitch, RN ?Outcome: Adequate for Discharge ?01/13/2022 1201 by Jerene Pitch, RN ?Outcome: Adequate for Discharge ?Goal: Respiratory complications will improve ?01/13/2022 1202 by Jerene Pitch, RN ?Outcome: Adequate for Discharge ?01/13/2022 1201 by Jerene Pitch, RN ?Outcome: Adequate for Discharge ?Goal: Cardiovascular complication will be avoided ?01/13/2022 1202 by Jerene Pitch, RN ?Outcome: Adequate for Discharge ?01/13/2022 1201 by Jerene Pitch, RN ?Outcome: Adequate for Discharge ?  ?Problem: Activity: ?Goal: Risk for activity intolerance will decrease ?01/13/2022 1202 by Jerene Pitch, RN ?Outcome: Adequate for Discharge ?01/13/2022 1201 by Jerene Pitch, RN ?Outcome: Adequate for Discharge ?01/13/2022 0756 by Jerene Pitch, RN ?Outcome: Progressing ?  ?Problem: Nutrition: ?Goal: Adequate  nutrition will be maintained ?01/13/2022 1202 by Jerene Pitch, RN ?Outcome: Adequate for Discharge ?01/13/2022 1201 by Jerene Pitch, RN ?Outcome: Adequate for Discharge ?01/13/2022 0756 by Jerene Pitch, RN ?Outcome: Progressing ?  ?Problem: Coping: ?Goal: Level of anxiety will decrease ?01/13/2022 1202 by Jerene Pitch, RN ?Outcome: Adequate for Discharge ?01/13/2022 1201 by Jerene Pitch, RN ?Outcome: Adequate for Discharge ?01/13/2022 0756 by Jerene Pitch, RN ?Outcome: Progressing ?  ?Problem: Pain Managment: ?Goal: General experience of comfort will improve ?01/13/2022 1202 by Jerene Pitch, RN ?Outcome: Adequate for Discharge ?01/13/2022 1201 by Jerene Pitch, RN ?Outcome: Adequate for Discharge ?  ?Problem: Safety: ?Goal: Ability to remain free from injury will improve ?01/13/2022 1202 by Jerene Pitch, RN ?Outcome: Adequate for Discharge ?01/13/2022 1201 by Jerene Pitch, RN ?Outcome: Adequate for Discharge ?  ?Problem: Skin Integrity: ?Goal: Risk for impaired skin integrity will decrease ?01/13/2022 1202 by Jerene Pitch, RN ?Outcome: Adequate for Discharge ?01/13/2022 1201 by Jerene Pitch, RN ?Outcome: Adequate for Discharge ?  ?

## 2022-01-13 NOTE — Plan of Care (Signed)
  Problem: Activity: Goal: Risk for activity intolerance will decrease Outcome: Progressing   Problem: Nutrition: Goal: Adequate nutrition will be maintained Outcome: Progressing   Problem: Coping: Goal: Level of anxiety will decrease Outcome: Progressing   

## 2022-01-13 NOTE — Progress Notes (Signed)
PT Cancellation Note ? ?Patient Details ?Name: Cheryl Haley ?MRN: [id number] ?DOB: 1956-04-03 ? ? ?Cancelled Treatment:    Reason Eval/Treat Not Completed: PT screened, no needs identified, will sign off (Patient is mobilizing in room independently and feels she is at baseline. Does not have any acute PT needs. Will sign off at this time, please re-consult if there is a decline in functional mobility.) ? ? ?Gwynneth Albright PT, DPT ?Acute Rehabilitation Services ?Office [phone] ?Pager [phone] ? ?01/13/22 11:53 AM ? ? ?

## 2022-01-13 NOTE — Discharge Summary (Addendum)
Physician Discharge Summary  Cheryl Haley ZOX:[id number] DOB: 1956/06/12  PCP: [facility] [place]  Admitted from: Home Discharged to: Home  Admit date: 01/10/2022 Discharge date: 01/13/2022  Recommendations for Outpatient Follow-up:    Follow-up Information     [facility] [place]. Schedule an appointment as soon as possible for a visit in 1 week(s).   Specialty: Family Medicine Why: Hospital discharge follow-up.  To be seen with repeat labs (CBC & BMP). Contact information: [address] [place] [address] [phone]         Kalman Shan, MD. Schedule an appointment as soon as possible for a visit in 2 week(s).   Specialty: Pulmonary Disease Contact information: [address] [place] [address] [phone]         Quintella Reichert, MD .   Specialty: Cardiology Contact information: [phone] [address] [place] [address] [phone]                  Home Health: None    Equipment/Devices: None    Discharge Condition: Improved and stable   Code Status: Full Code Diet recommendation:  Discharge Diet Orders (From admission, onward)     Start     Ordered   01/13/22 0000  Diet - low sodium heart healthy        01/13/22 1103             Discharge Diagnoses:  Principal Problem:   COPD exacerbation (HCC) Active Problems:   COPD (chronic obstructive pulmonary disease) (HCC)   Brief Summary: 66 year old female, lives with her sister, mostly independent but at times uses walker to ambulate to minimize chronic dyspnea on exertion, medical history significant for advanced COPD, chronic respiratory failure with hypoxia on 2 L/min [place] oxygen, hypothyroidism, essential hypertension, hyperlipidemia, essential thrombocythemia on hydroxyurea, presented to the ED with 2 days history of progressive dyspnea with hypoxia and increased oxygen requirement from 2 to 3 to 4 L/min.   CTA chest negative for acute pulmonary embolism, revealed concern for mucoid impaction, possible right upper lobe pneumonia and bilateral pulmonary nodules, aortic atherosclerosis and emphysema.  Admitted for acute on chronic respiratory failure with hypoxia due to COPD exacerbation, mucus plugging and possible right upper lobe pneumonia.  Assessment and plan:  Acute on chronic respiratory failure with hypoxia due to COPD exacerbation, mucus plugging and possible right upper lobe pneumonia, POA: Steroid and oxygen dependent COPD.  Follows with Dr. Marchelle Gearing, pulmonology.  Reports that she has also had lung nodule biopsy and follows with Dr. Tonia Brooms, pulmonology.  Flu panel and COVID-19 RT-PCR negative.  Urine strep pneumo antigen negative.  Treated with IV azithromycin, ceftriaxone (completed 3 days course), IV Solu-Medrol, DuoNeb every 6 hours, hypertonic saline nebs, chest physiotherapy, incentive spirometry to optimize pulmonary toilet.  She has clinically improved and indicates that her breathing is almost back to her baseline.  She does have chronic dyspnea on exertion.  She is back to her home level of oxygen 2 L/min.  She will be discharged on oral Vantin to complete total 5 days course of antibiotics, prednisone taper followed by back to her maintenance dose of prednisone 5 Mg daily and prior home inhaler regimen and Daliresp.  Recommend close outpatient follow-up with her pulmonologist.  Also recommend repeat imaging i.e. CT chest in 4 weeks to ensure resolution of abnormal findings or as deemed appropriate by her outpatient pulmonologist.  Essential hypertension: In the hospital she  was on reduced dose of Toprol-XL, back on prior home dose of Toprol-XL at discharge.  Essential thrombocythemia: Continue hydroxyurea  Hyperlipidemia: Continue Nexlizet at and rosuvastatin.  Hypothyroidism: Continue Synthroid.  Known history of lung nodules: Follows with Dr. Tonia Brooms, pulmonology.  Recommend continued  close follow-up as outpatient by him.  Aortic atherosclerosis: Seen on CT.  Continue aspirin 81 Mg daily and antilipid medications as above.  Body mass index is 16.14 kg/m./Underweight: Continue on nutritional supplements and vitamins.  Anemia: Unclear etiology.  Stable in the 11 g range.?  Anemia of chronic disease.    Consultations: None  Procedures: None   Discharge Instructions  Discharge Instructions     Call MD for:  difficulty breathing, headache or visual disturbances   Complete by: As directed    Call MD for:  extreme fatigue   Complete by: As directed    Call MD for:  persistant dizziness or light-headedness   Complete by: As directed    Call MD for:  persistant nausea and vomiting   Complete by: As directed    Call MD for:  severe uncontrolled pain   Complete by: As directed    Call MD for:  temperature >100.4   Complete by: As directed    Diet - low sodium heart healthy   Complete by: As directed    Increase activity slowly   Complete by: As directed         Medication List     TAKE these medications    albuterol 108 (90 Base) MCG/ACT inhaler Commonly known as: VENTOLIN HFA Inhale 2 puffs into the lungs every 6 (six) hours as needed for wheezing or shortness of breath.   alendronate 70 MG tablet Commonly known as: FOSAMAX Take 70 mg by mouth every Saturday.   aspirin EC 81 MG tablet Take 81 mg daily by mouth.   Breztri Aerosphere 160-9-4.8 MCG/ACT Aero Generic drug: Budeson-Glycopyrrol-Formoterol INHALE 2 PUFFS INTO LUNGS IN THE MORNING AND AT BEDTIME What changed: See the new instructions.   cefpodoxime 200 MG tablet Commonly known as: VANTIN Take 1 tablet (200 mg total) by mouth 2 (two) times daily for 2 days.   Daliresp 500 MCG Tabs tablet Generic drug: roflumilast Take 1 tablet by mouth once daily What changed: how much to take   diphenhydrAMINE 25 MG tablet Commonly known as: BENADRYL Take 25 mg by mouth every 6 (six) hours as  needed for allergies.   feeding supplement Liqd Take 237 mLs by mouth 2 (two) times daily between meals. What changed: when to take this   guaiFENesin 600 MG 12 hr tablet Commonly known as: Mucinex Take 1 tablet (600 mg total) by mouth 2 (two) times daily for 10 days.   hydroxyurea 500 MG capsule Commonly known as: HYDREA TAKE 1 CAPSULE BY MOUTH EVERY OTHER DAY TAKE WITH FOOD TO MINIMIZE GI SIDE EFFECTS What changed:  how much to take how to take this when to take this additional instructions   hydrOXYzine 25 MG tablet Commonly known as: ATARAX Take 25 mg by mouth at bedtime as needed (sleep).   levothyroxine 75 MCG tablet Commonly known as: SYNTHROID Take 75 mcg by mouth every morning.   metoprolol succinate 25 MG 24 hr tablet Commonly known as: TOPROL-XL Take 1 tablet (25 mg total) by mouth daily.   multivitamin with minerals Tabs tablet Take 1 tablet by mouth daily.   Nexlizet 180-10 MG Tabs Generic drug: Bempedoic Acid-Ezetimibe Take 1 tablet by mouth daily.  OXYGEN Inhale 2 L into the lungs continuous.   predniSONE 10 MG tablet Commonly known as: DELTASONE Take 2 tabs (20 mg total) daily for 2 days, then 1 tab (10 Mg total) daily for 2 days, then continue prior maintenance dose (different prescription) of 1 tab (5 Mg total) daily indefinitely. Start taking on: Jan 14, 2022 What changed: You were already taking a medication with the same name, and this prescription was added. Make sure you understand how and when to take each.   predniSONE 5 MG tablet Commonly known as: DELTASONE Take 1 tablet (5 mg total) by mouth daily with breakfast. Start taking on: Jan 18, 2022 What changed: These instructions start on Jan 18, 2022. If you are unsure what to do until then, ask your doctor or other care provider.   rosuvastatin 40 MG tablet Commonly known as: CRESTOR Take 1 tablet (40 mg total) by mouth daily.       Allergies  Allergen Reactions   Bisoprolol  Fumarate     Other reaction(s): heart issues      Procedures/Studies: CT Angio Chest PE W and/or Wo Contrast  Result Date: 01/10/2022 CLINICAL DATA:  Positive D-dimer pulmonary embolus suspected. Shortness of breath x2 days. * Tracking Code: BO * EXAM: CT ANGIOGRAPHY CHEST WITH CONTRAST TECHNIQUE: Multidetector CT imaging of the chest was performed using the standard protocol during bolus administration of intravenous contrast. Multiplanar CT image reconstructions and MIPs were obtained to evaluate the vascular anatomy. RADIATION DOSE REDUCTION: This exam was performed according to the departmental dose-optimization program which includes automated exposure control, adjustment of the mA and/or kV according to patient size and/or use of iterative reconstruction technique. CONTRAST:  75mL OMNIPAQUE IOHEXOL 350 MG/ML SOLN COMPARISON:  Chest CT August 12, 2021. FINDINGS: Cardiovascular: Satisfactory opacification of the pulmonary arteries to the segmental level. No evidence of pulmonary embolism. Aortic atherosclerosis without aneurysmal dilation or aortic dissection. Normal heart size. No significant pericardial effusion/thickening. Mediastinum/Nodes: No suspicious thyroid nodule. No pathologically enlarged mediastinal, hilar or axillary lymph nodes. The esophagus is grossly unremarkable. Lungs/Pleura: Consolidative airspace disease in the posterior right upper lobe is difficult to measure and overall appears similar to minimally progressed from prior with differences in measurement possibly reflecting orthogonal plane 3.0 x 4.6 cm on image 30/7 previously 3.8 x 3.5 cm. New nodular tubular opacities along the lateral aspect of this consolidation on image 30/7 which are favored to reflect mucoid impaction. Lateral right upper lobe lesion with adjacent metallic c density lip measures approximately 17 x 6 mm on image 39-40/7, unchanged. Left upper lobe pulmonary nodule now measures 5 mm on image 40/7 previously  7 mm. Centrilobular and paraseptal emphysema mild diffuse bronchial wall thickening. Upper Abdomen: No acute abnormality. Musculoskeletal: No aggressive lytic or blastic lesion of bone. Review of the MIP images confirms the above findings. IMPRESSION: 1. No evidence of acute pulmonary embolism. 2. Consolidative airspace disease in the posterior right upper lobe is again difficult to measure but appears similar to minimally progressed in the interval with differences in measurement likely related to orthogonal plane now measuring 4.6 x 3.0 cm previously 3.8 x 3.5 cm. New nodular tubular opacities along the lateral aspect of this consolidation are favored to reflect mucoid impaction. Attention on follow-up imaging suggested. 3. Other bilateral pulmonary nodules are overall stable in the interval. 4. Aortic Atherosclerosis (ICD10-I70.0) and Emphysema (ICD10-J43.9). Electronically Signed   By: Maudry Mayhew M.D.   On: 01/10/2022 10:53   DG Chest Portable 1 View  Result Date: 01/10/2022 CLINICAL DATA:  Shortness of breath. EXAM: PORTABLE CHEST 1 VIEW COMPARISON:  August 20, 2021.  August 12, 2021. FINDINGS: The heart size and mediastinal contours are within normal limits. Hyperexpansion of the lungs is again noted. Left lung is unremarkable. Increased right suprahilar opacity is noted concerning for increased nodule or other pathology. The visualized skeletal structures are unremarkable. IMPRESSION: Increased right suprahilar density is noted concerning for increased nodule or neoplasm or other pathology. CT scan is recommended for further evaluation. Aortic Atherosclerosis (ICD10-I70.0). Electronically Signed   By: Lupita Raider M.D.   On: 01/10/2022 09:02      Subjective: Overall feels much better compared to admission.  Has chronic dyspnea on exertion which had gotten worse PTA.  Reports that her breathing has significantly improved and approaching her baseline.  Reports some cough with intermittent  yellow sputum.  Discharge Exam:  Vitals:   01/13/22 0645 01/13/22 0752 01/13/22 0756 01/13/22 1012  BP: 122/61   128/66  Pulse: 65   87  Resp: 18 20    Temp: (!) 97.5 F (36.4 C)     TempSrc: Oral     SpO2: 99% 99% 99%   Weight:      Height:        General: Pleasant middle-age female, moderately built and thinly nourished sitting up comfortably in bed without distress. Cardiovascular: S1 & S2 heard, RRR, S1/S2 +. No murmurs, rubs, gallops or clicks. No JVD or pedal edema.  Telemetry personally reviewed: Sinus rhythm. Respiratory: Clear to auscultation anteriorly.  Very occasional rhonchi posteriorly but otherwise good air entry.  No increased work of breathing. Abdominal:  Non distended, non tender & soft. No organomegaly or masses appreciated. Normal bowel sounds heard. CNS: Alert and oriented. No focal deficits. Extremities: no edema, no cyanosis    The results of significant diagnostics from this hospitalization (including imaging, microbiology, ancillary and laboratory) are listed below for reference.     Microbiology: Recent Results (from the past 240 hour(s))  Resp Panel by RT-PCR (Flu A&B, Covid) Nasopharyngeal Swab     Status: None   Collection Time: 01/10/22 12:00 PM   Specimen: Nasopharyngeal Swab; Nasopharyngeal(NP) swabs in vial transport medium  Result Value Ref Range Status   SARS Coronavirus 2 by RT PCR NEGATIVE NEGATIVE Final    Comment: (NOTE) SARS-CoV-2 target nucleic acids are NOT DETECTED.  The SARS-CoV-2 RNA is generally detectable in upper respiratory specimens during the acute phase of infection. The lowest concentration of SARS-CoV-2 viral copies this assay can detect is 138 copies/mL. A negative result does not preclude SARS-Cov-2 infection and should not be used as the sole basis for treatment or other patient management decisions. A negative result may occur with  improper specimen collection/handling, submission of specimen other than  nasopharyngeal swab, presence of viral mutation(s) within the areas targeted by this assay, and inadequate number of viral copies(<138 copies/mL). A negative result must be combined with clinical observations, patient history, and epidemiological information. The expected result is Negative.  Fact Sheet for Patients:  BloggerCourse.com  Fact Sheet for Healthcare Providers:  SeriousBroker.it  This test is no t yet approved or cleared by the [place] FDA and  has been authorized for detection and/or diagnosis of SARS-CoV-2 by FDA under an Emergency Use Authorization (EUA). This EUA will remain  in effect (meaning this test can be used) for the duration of the COVID-19 declaration under Section 564(b)(1) of the Act, 21 U.S.C.section 360bbb-3(b)(1), unless the authorization is  terminated  or revoked sooner.       Influenza A by PCR NEGATIVE NEGATIVE Final   Influenza B by PCR NEGATIVE NEGATIVE Final    Comment: (NOTE) The Xpert Xpress SARS-CoV-2/FLU/RSV plus assay is intended as an aid in the diagnosis of influenza from Nasopharyngeal swab specimens and should not be used as a sole basis for treatment. Nasal washings and aspirates are unacceptable for Xpert Xpress SARS-CoV-2/FLU/RSV testing.  Fact Sheet for Patients: BloggerCourse.com  Fact Sheet for Healthcare Providers: SeriousBroker.it  This test is not yet approved or cleared by the [place] FDA and has been authorized for detection and/or diagnosis of SARS-CoV-2 by FDA under an Emergency Use Authorization (EUA). This EUA will remain in effect (meaning this test can be used) for the duration of the COVID-19 declaration under Section 564(b)(1) of the Act, 21 U.S.C. section 360bbb-3(b)(1), unless the authorization is terminated or revoked.  Performed at [facility], [address] [place], [place]  [address]   Expectorated Sputum Assessment w Gram Stain, Rflx to Resp Cult     Status: None   Collection Time: 01/13/22 10:21 AM   Specimen: Expectorated Sputum  Result Value Ref Range Status   Specimen Description EXPECTORATED SPUTUM  Final   Special Requests Normal  Final   Sputum evaluation   Final    THIS SPECIMEN IS ACCEPTABLE FOR SPUTUM CULTURE Performed at [facility], [name], [place], [place] [address]    Report Status 01/13/2022 FINAL  Final     Labs: CBC: Recent Labs  Lab 01/10/22 0845 01/11/22 0431 01/12/22 0349 01/13/22 0348  WBC 5.8 5.2 4.8 5.6  NEUTROABS 3.8 4.3 4.3 4.2  HGB 13.2 11.8* 11.3* 11.6*  HCT 39.6 37.0 34.7* 37.4  MCV 90.4 93.0 93.5 94.9  PLT 444* 419* 390 439*    Basic Metabolic Panel: Recent Labs  Lab 01/10/22 0845 01/11/22 0431 01/12/22 0349 01/13/22 0348  NA 137 136 140 140  K 4.1 4.0 4.6 4.0  CL 98 100 104 102  CO2 28 28 29  32  GLUCOSE 104* 112* 131* 93  BUN 19 18 15 18   CREATININE 0.94 0.65 0.57 0.69  CALCIUM 9.3 8.6* 8.6* 8.7*  MG  --  2.6*  --   --   PHOS  --  3.0  --   --     Liver Function Tests: Recent Labs  Lab 01/10/22 0845 01/11/22 0431  AST 37 37  ALT 26 28  ALKPHOS 48 45  BILITOT 0.9 0.6  PROT 7.7 7.3  ALBUMIN 3.7 3.5     Time coordinating discharge: 35 minutes  SIGNED:  Marcellus Scott, MD,  FACP, [facility], [facility], [facility] (Care Management Physician Certified). [facility]  To contact the attending provider between 7A-7P or the covering provider during after hours 7P-7A, please log into the web site [email or web address] and access using universal [place] password for that web site. If you do not have the password, please call the hospital operator.

## 2022-01-13 NOTE — Discharge Instructions (Signed)

## 2022-01-13 NOTE — Plan of Care (Signed)
?  Problem: Education: ?Goal: Knowledge of General Education information will improve ?Description: Including pain rating scale, medication(s)/side effects and non-pharmacologic comfort measures ?Outcome: Adequate for Discharge ?  ?Problem: Health Behavior/Discharge Planning: ?Goal: Ability to manage health-related needs will improve ?Outcome: Adequate for Discharge ?  ?Problem: Clinical Measurements: ?Goal: Ability to maintain clinical measurements within normal limits will improve ?Outcome: Adequate for Discharge ?Goal: Will remain free from infection ?Outcome: Adequate for Discharge ?Goal: Diagnostic test results will improve ?Outcome: Adequate for Discharge ?Goal: Respiratory complications will improve ?Outcome: Adequate for Discharge ?Goal: Cardiovascular complication will be avoided ?Outcome: Adequate for Discharge ?  ?Problem: Activity: ?Goal: Risk for activity intolerance will decrease ?01/13/2022 1201 by Jerene Pitch, RN ?Outcome: Adequate for Discharge ?01/13/2022 0756 by Jerene Pitch, RN ?Outcome: Progressing ?  ?Problem: Nutrition: ?Goal: Adequate nutrition will be maintained ?01/13/2022 1201 by Jerene Pitch, RN ?Outcome: Adequate for Discharge ?01/13/2022 0756 by Jerene Pitch, RN ?Outcome: Progressing ?  ?

## 2022-01-13 NOTE — Progress Notes (Signed)
Patient felt the flutter was very effective clearing her airway this morning. The patient requested not to be placed on the chest vest as long as she could tolerate the flutter valve and it was effective.  ?

## 2022-01-14 LAB — LEGIONELLA PNEUMOPHILA SEROGP 1 UR AG: L. pneumophila Serogp 1 Ur Ag: NEGATIVE

## 2022-01-15 LAB — CULTURE, RESPIRATORY W GRAM STAIN
Culture: NORMAL
Special Requests: NORMAL

## 2022-01-24 ENCOUNTER — Ambulatory Visit (INDEPENDENT_AMBULATORY_CARE_PROVIDER_SITE_OTHER): Payer: Medicare Other | Admitting: Adult Health

## 2022-01-24 ENCOUNTER — Encounter: Payer: Self-pay | Admitting: Adult Health

## 2022-01-24 ENCOUNTER — Telehealth: Payer: Self-pay | Admitting: Adult Health

## 2022-01-24 DIAGNOSIS — R918 Other nonspecific abnormal finding of lung field: Secondary | ICD-10-CM

## 2022-01-24 DIAGNOSIS — J9611 Chronic respiratory failure with hypoxia: Secondary | ICD-10-CM

## 2022-01-24 DIAGNOSIS — J441 Chronic obstructive pulmonary disease with (acute) exacerbation: Secondary | ICD-10-CM | POA: Diagnosis not present

## 2022-01-24 DIAGNOSIS — E43 Unspecified severe protein-calorie malnutrition: Secondary | ICD-10-CM

## 2022-01-24 MED ORDER — ALBUTEROL SULFATE (2.5 MG/3ML) 0.083% IN NEBU: INHALATION_SOLUTION | RESPIRATORY_TRACT | 12 refills | Status: DC

## 2022-01-24 NOTE — Assessment & Plan Note (Signed)
Recent COPD exacerbation plus or minus pneumonia with hospitalization.  Patient was treated with empiric antibiotics and nebulized bronchodilators.  She is continue on her triple therapy maintenance inhaler, chronic steroids with prednisone 5 mg and Daliresp.  Plan  Patient Instructions  Continue on BREZTRI 2 puffs Twice daily  , rinse after use.  Albuterol neb 1-2 times daily for at least 2 weeks and As needed   Begin flutter valve Three times a day    Continue on Prednisone '5mg'$  daily  Continue on Daliresp daily  Continue on Oxygen 2l/m at rest and 3 L with activity Activity as tolerated.  High protein diet .  Activity as tolerated.  CT chest next month as planned.  Follow up with Dr. Valeta Harms early July as planned and As needed   Please contact office for sooner follow up if symptoms do not improve or worsen or seek emergency care

## 2022-01-24 NOTE — Assessment & Plan Note (Signed)
Mildly increased right upper lobe consolidation and new lateral right upper lobe opacities.  Possible underlying infectious source.  She underwent navigational bronchoscopy in December with negative cultures and cytology.  She is high risk for underlying malignancy with smoking history. She has a follow-up CT chest in 4 weeks for serial follow-up.  Plan  Patient Instructions  Continue on BREZTRI 2 puffs Twice daily  , rinse after use.  Albuterol neb 1-2 times daily for at least 2 weeks and As needed   Begin flutter valve Three times a day    Continue on Prednisone '5mg'$  daily  Continue on Daliresp daily  Continue on Oxygen 2l/m at rest and 3 L with activity Activity as tolerated.  High protein diet .  Activity as tolerated.  CT chest next month as planned.  Follow up with Dr. Valeta Harms early July as planned and As needed   Please contact office for sooner follow up if symptoms do not improve or worsen or seek emergency care

## 2022-01-24 NOTE — Assessment & Plan Note (Signed)
Encourage patient on high-protein diet.  Using Ensure between meals.  Patient is on Daliresp which can decrease appetite.  Could consider decreasing dose.  We talked about holding for drug holiday and lower dosing however patient declines at this time says that she has been on this for a while does not feel like this is the reason for her weight loss.  Says that her appetite is improved and she is eating better since getting out of the hospital.  We will need to continue to monitor closely.

## 2022-01-24 NOTE — Telephone Encounter (Signed)
Called and spoke with patient who states that she needs RX for albuterol nebs sent to pharmacy. Per OV notes from today. RX has been sent to preferred pharmacy. Nothing further needed at this time.   Patient Instructions  Continue on BREZTRI 2 puffs Twice daily  , rinse after use.  Albuterol neb 1-2 times daily for at least 2 weeks and As needed   Begin flutter valve Three times a day    Continue on Prednisone '5mg'$  daily  Continue on Daliresp daily  Continue on Oxygen 2l/m at rest and 3 L with activity Activity as tolerated.  High protein diet .  Activity as tolerated.  CT chest next month as planned.  Follow up with Dr. Valeta Harms early July as planned and As needed   Please contact office for sooner follow up if symptoms do not improve or worsen or seek emergency care

## 2022-01-24 NOTE — Assessment & Plan Note (Signed)
Continue on oxygen 2 L at rest and 3 L with activity.  Back to her baseline of oxygen demands.

## 2022-01-24 NOTE — Progress Notes (Signed)
$'@Patient'w$  ID: Cheryl Haley, female    DOB: 12-03-1955, 66 y.o.   MRN: [id number]  Chief Complaint  Patient presents with   Hospitalization Follow-up    Referring provider: [facility]  HPI: 66 year old female former smoker followed for COPD and chronic respiratory failure on oxygen and pulmonary nodules.  On chronic steroids prednisone 5 mg daily since July 2022  TEST/EVENTS :  Chest Imaging- films reviewed: CT chest 06/28/2019- severe centrilobular emphysema throughout both lungs.  Left upper lobe spiculated nodule associated with linear scar.  Right middle lobe spiculated nodule.  Airway thickening.  Scarring in right greater than left lower lobes.  No significant mediastinal or hilar adenopathy.  Nodules stable or smaller than previous CT scans.   CT cardiac scoring 10/07/2019- redemonstrated right middle lobe nodule.  No new concerning nodules.  Interval near complete resolution of previously demonstrated lingular infiltrate, consistent with resolving pneumonia. 2. The 3 suspicious right lung nodules seen on the most recent study persist, and based on multiplicity could be postinflammatory.  PET scan 07/09/2021- Moderate metabolic activity associated with irregular nodules clustered in the RIGHT upper lobe. Favor post inflammatory or infectious process. Cannot exclude neoplasm. Recommend tissue sampling versus CT surveillance. 2. Persistent but decreased activity in LEFT upper lobe parenchymal lesion.     Pulmonary Functions Testing Results: Spirometry 2015: FVC  2.0 (63%) FEV1  0.8 (33%) Ratio 41   10/25/2019 SPECT scan: EF 63%, low risk study.  No EKG changes during stress.  01/24/2022 Follow up : COPD , O2 RF , Pulmonary nodules , post hospital follow up  Patient presents for a post hospital follow-up.  Patient was recently hospitalized for COPD exacerbation.  Patient had increased shortness of breath and increased oxygen demands.  CT chest angio was  negative for acute pulmonary embolism.  Showed increased consolidative airspace disease in the right upper lobe measuring at 3.  0 x 4.6 cm.  New nodular opacities in the lateral aspect of consolidation and stable scattered pulmonary nodules.  Sputum culture was negative.  She was treated with IV antibiotics and discharged on oral Vantin.  She remains on Breztri inhaler twice daily.  Chronic steroids with prednisone 5 mg daily.  And Daliresp.  Patient says she is feeling better.  Feels like she is getting back to her baseline.  She denies any significant cough.  Says that she cannot cough up anything to be honest.  She does have albuterol nebulizers but has run out.  She also has a flutter valve that she has not been using at home.  Patient remains on oxygen 2 L at rest and 3 L with activity.  Does have a POC.  Since discharge says her oxygen levels are back to baseline with no increased demands.  Patient has a known history of pulmonary nodules.  She underwent navigational bronchoscopy August 28, 2021.  Cytology was negative for malignancy.  Cultures were negative.  She was felt to have an infectious source.  She was treated with antibiotics. As above CT chest Jan 10, 2022 showed minimally progressed right upper lobe consolidation and new nodular opacities along the lateral aspect of this consolidation questionable mucoid impaction, previous pulmonary nodules stable   Allergies  Allergen Reactions   Bisoprolol Fumarate     Other reaction(s): heart issues    Immunization History  Administered Date(s) Administered   Fluad Quad(high Dose 65+) 06/06/2020, 05/10/2021   Influenza Split 05/31/2015, 06/07/2018, 05/09/2019   Influenza,inj,Quad PF,6+ Mos 09/22/2013, 06/10/2016, 06/25/2017, 04/30/2018,  06/01/2019   Influenza,inj,quad, With Preservative 09/07/2015   PFIZER(Purple Top)SARS-COV-2 Vaccination 11/27/2019, 12/18/2019   Pneumococcal Conjugate-13 04/17/2015   Pneumococcal Polysaccharide-23  12/08/2016   Tdap 04/10/2017    Past Medical History:  Diagnosis Date   Agatston coronary artery calcium score greater than 400    score 678   Anxiety    Arthritis    "in neck"   COPD (chronic obstructive pulmonary disease) ([place])    Dyspnea    Emphysema lung ([place])    Essential thrombocythemia ([place]) 09/15/2019   History of hiatal hernia    Hx of TIA (transient ischemic attack) and stroke    Hyperlipidemia    Hypertension    Hypothyroidism    Impaired fasting glucose    Pleural effusion 05/15/2014   CXR 05/2014:  New small right effusion.     Pneumonia    Protein-calorie malnutrition, severe 09/17/2018   Pulmonary nodules 07/14/2018   07/2017 CT chest >pulmonary nodules noted.  07/2017 PET scan -Hypermetabolic LUL nodule  05/4495 CT chest >decreased nodule size, new pulmonary nodules noted.  FOB -atypical cells  CT chest 07/2018 >stable nodules to smaller on established nodules . 2 new nodules RUL , RLL >CT chest 3 months    Thyroid disease    Tobacco abuse 11/28/2015   Smoked 2 packs of cigarettes daily from teenage years until age 85, quit in 2016    Unspecified hypothyroidism 09/20/2013   Viral respiratory infection 09/16/2018    Tobacco History: Social History   Tobacco Use  Smoking Status Former   Packs/day: 2.00   Years: 40.00   Pack years: 80.00   Types: Cigarettes   Quit date: 03/16/2017   Years since quitting: 4.8  Smokeless Tobacco Never  Tobacco Comments       Counseling given: Not Answered Tobacco comments:     Outpatient Medications Prior to Visit  Medication Sig Dispense Refill   albuterol (VENTOLIN HFA) 108 (90 Base) MCG/ACT inhaler Inhale 2 puffs into the lungs every 6 (six) hours as needed for wheezing or shortness of breath. 8 g 6   alendronate (FOSAMAX) 70 MG tablet Take 70 mg by mouth every Saturday.     aspirin EC 81 MG tablet Take 81 mg daily by mouth.      Bempedoic Acid-Ezetimibe (NEXLIZET) 180-10 MG TABS Take 1 tablet by mouth daily. 90  tablet 3   Budeson-Glycopyrrol-Formoterol (BREZTRI AEROSPHERE) 160-9-4.8 MCG/ACT AERO INHALE 2 PUFFS INTO LUNGS IN THE MORNING AND AT BEDTIME (Patient taking differently: Inhale 2 puffs into the lungs 2 (two) times daily.) 11 g 5   feeding supplement (ENSURE ENLIVE / ENSURE PLUS) LIQD Take 237 mLs by mouth 2 (two) times daily between meals. (Patient taking differently: Take 237 mLs by mouth daily.) 237 mL 12   hydroxyurea (HYDREA) 500 MG capsule TAKE 1 CAPSULE BY MOUTH EVERY OTHER DAY TAKE WITH FOOD TO MINIMIZE GI SIDE EFFECTS (Patient taking differently: Take 500 mg by mouth every other day.) 45 capsule 6   hydrOXYzine (ATARAX) 25 MG tablet Take 25 mg by mouth at bedtime as needed (sleep).     levothyroxine (SYNTHROID) 75 MCG tablet Take 75 mcg by mouth every morning.     metoprolol succinate (TOPROL-XL) 25 MG 24 hr tablet Take 1 tablet (25 mg total) by mouth daily. 90 tablet 3   Multiple Vitamin (MULTIVITAMIN WITH MINERALS) TABS tablet Take 1 tablet by mouth daily.     OXYGEN Inhale 2 L into the lungs continuous.     predniSONE (  DELTASONE) 5 MG tablet Take 1 tablet (5 mg total) by mouth daily with breakfast.     roflumilast (DALIRESP) 500 MCG TABS tablet Take 1 tablet by mouth once daily (Patient taking differently: Take 500 mcg by mouth daily.) 30 tablet 11   rosuvastatin (CRESTOR) 40 MG tablet Take 1 tablet (40 mg total) by mouth daily. 90 tablet 3   diphenhydrAMINE (BENADRYL) 25 MG tablet Take 25 mg by mouth every 6 (six) hours as needed for allergies. (Patient not taking: Reported on 01/24/2022)     predniSONE (DELTASONE) 10 MG tablet Take 2 tabs (20 mg total) daily for 2 days, then 1 tab (10 Mg total) daily for 2 days, then continue prior maintenance dose (different prescription) of 1 tab (5 Mg total) daily indefinitely. 6 tablet 0   No facility-administered medications prior to visit.     Review of Systems:   Constitutional:   No  weight loss, night sweats,  Fevers, chills,  +fatigue, or   lassitude.  HEENT:   No headaches,  Difficulty swallowing,  Tooth/dental problems, or  Sore throat,                No sneezing, itching, ear ache, nasal congestion, post nasal drip,   CV:  No chest pain,  Orthopnea, PND, swelling in lower extremities, anasarca, dizziness, palpitations, syncope.   GI  No heartburn, indigestion, abdominal pain, nausea, vomiting, diarrhea, change in bowel habits, loss of appetite, bloody stools.   Resp:  No chest wall deformity  Skin: no rash or lesions.  GU: no dysuria, change in color of urine, no urgency or frequency.  No flank pain, no hematuria   MS:  No joint pain or swelling.  No decreased range of motion.  No back pain.    Physical Exam  BP 130/60 (BP Location: Left Arm, Patient Position: Sitting, Cuff Size: Normal)   Pulse (!) 101   Temp 98.2 F (36.8 C) (Oral)   Ht '5\' 4"'$  (1.626 m)   Wt 96 lb 12.8 oz (43.9 kg)   SpO2 91%   BMI 16.62 kg/m   GEN: A/Ox3; pleasant , NAD, thin, elderly, on oxygen, rolling walker   HEENT:  [place]/AT,  NOSE-clear, THROAT-clear, no lesions, no postnasal drip or exudate noted.   NECK:  Supple w/ fair ROM; no JVD; normal carotid impulses w/o bruits; no thyromegaly or nodules palpated; no lymphadenopathy.    RESP  Clear  P & A; w/o, wheezes/ rales/ or rhonchi. no accessory muscle use, no dullness to percussion  CARD:  RRR, no m/r/g, no peripheral edema, pulses intact, no cyanosis or clubbing.  GI:   Soft & nt; nml bowel sounds; no organomegaly or masses detected.   Musco: Warm bil, no deformities or joint swelling noted.   Neuro: alert, no focal deficits noted.    Skin: Warm, no lesions or rashes    Lab Results:    BMET   BNP   Imaging: CT Angio Chest PE W and/or Wo Contrast  Result Date: 01/10/2022 CLINICAL DATA:  Positive D-dimer pulmonary embolus suspected. Shortness of breath x2 days. * Tracking Code: BO * EXAM: CT ANGIOGRAPHY CHEST WITH CONTRAST TECHNIQUE: Multidetector CT imaging of the  chest was performed using the standard protocol during bolus administration of intravenous contrast. Multiplanar CT image reconstructions and MIPs were obtained to evaluate the vascular anatomy. RADIATION DOSE REDUCTION: This exam was performed according to the departmental dose-optimization program which includes automated exposure control, adjustment of the mA and/or kV according  to patient size and/or use of iterative reconstruction technique. CONTRAST:  82m OMNIPAQUE IOHEXOL 350 MG/ML SOLN COMPARISON:  Chest CT August 12, 2021. FINDINGS: Cardiovascular: Satisfactory opacification of the pulmonary arteries to the segmental level. No evidence of pulmonary embolism. Aortic atherosclerosis without aneurysmal dilation or aortic dissection. Normal heart size. No significant pericardial effusion/thickening. Mediastinum/Nodes: No suspicious thyroid nodule. No pathologically enlarged mediastinal, hilar or axillary lymph nodes. The esophagus is grossly unremarkable. Lungs/Pleura: Consolidative airspace disease in the posterior right upper lobe is difficult to measure and overall appears similar to minimally progressed from prior with differences in measurement possibly reflecting orthogonal plane 3.0 x 4.6 cm on image 30/7 previously 3.8 x 3.5 cm. New nodular tubular opacities along the lateral aspect of this consolidation on image 30/7 which are favored to reflect mucoid impaction. Lateral right upper lobe lesion with adjacent metallic c density lip measures approximately 17 x 6 mm on image 39-40/7, unchanged. Left upper lobe pulmonary nodule now measures 5 mm on image 40/7 previously 7 mm. Centrilobular and paraseptal emphysema mild diffuse bronchial wall thickening. Upper Abdomen: No acute abnormality. Musculoskeletal: No aggressive lytic or blastic lesion of bone. Review of the MIP images confirms the above findings. IMPRESSION: 1. No evidence of acute pulmonary embolism. 2. Consolidative airspace disease in the  posterior right upper lobe is again difficult to measure but appears similar to minimally progressed in the interval with differences in measurement likely related to orthogonal plane now measuring 4.6 x 3.0 cm previously 3.8 x 3.5 cm. New nodular tubular opacities along the lateral aspect of this consolidation are favored to reflect mucoid impaction. Attention on follow-up imaging suggested. 3. Other bilateral pulmonary nodules are overall stable in the interval. 4. Aortic Atherosclerosis (ICD10-I70.0) and Emphysema (ICD10-J43.9). Electronically Signed   By: JDahlia BailiffM.D.   On: 01/10/2022 10:53   DG Chest Portable 1 View  Result Date: 01/10/2022 CLINICAL DATA:  Shortness of breath. EXAM: PORTABLE CHEST 1 VIEW COMPARISON:  August 20, 2021.  August 12, 2021. FINDINGS: The heart size and mediastinal contours are within normal limits. Hyperexpansion of the lungs is again noted. Left lung is unremarkable. Increased right suprahilar opacity is noted concerning for increased nodule or other pathology. The visualized skeletal structures are unremarkable. IMPRESSION: Increased right suprahilar density is noted concerning for increased nodule or neoplasm or other pathology. CT scan is recommended for further evaluation. Aortic Atherosclerosis (ICD10-I70.0). Electronically Signed   By: JMarijo ConceptionM.D.   On: 01/10/2022 09:02         Latest Ref Rng & Units 07/19/2021   10:13 AM  PFT Results  FVC-Pre L 1.48    FVC-Predicted Pre % 47    Pre FEV1/FVC % % 36    FEV1-Pre L 0.54    FEV1-Predicted Pre % 22    DLCO uncorrected ml/min/mmHg 5.56    DLCO UNC% % 28    DLCO corrected ml/min/mmHg 5.56    DLCO COR %Predicted % 28    DLVA Predicted % 47      No results found for: NITRICOXIDE      Assessment & Plan:   COPD with acute exacerbation (HCC) Recent COPD exacerbation plus or minus pneumonia with hospitalization.  Patient was treated with empiric antibiotics and nebulized bronchodilators.   She is continue on her triple therapy maintenance inhaler, chronic steroids with prednisone 5 mg and Daliresp.  Plan  Patient Instructions  Continue on BREZTRI 2 puffs Twice daily  , rinse after use.  Albuterol neb  1-2 times daily for at least 2 weeks and As needed   Begin flutter valve Three times a day    Continue on Prednisone '5mg'$  daily  Continue on Daliresp daily  Continue on Oxygen 2l/m at rest and 3 L with activity Activity as tolerated.  High protein diet .  Activity as tolerated.  CT chest next month as planned.  Follow up with Dr. Valeta Harms early July as planned and As needed   Please contact office for sooner follow up if symptoms do not improve or worsen or seek emergency care       Chronic respiratory failure with hypoxia ([place]) Continue on oxygen 2 L at rest and 3 L with activity.  Back to her baseline of oxygen demands.  Pulmonary nodules Mildly increased right upper lobe consolidation and new lateral right upper lobe opacities.  Possible underlying infectious source.  She underwent navigational bronchoscopy in December with negative cultures and cytology.  She is high risk for underlying malignancy with smoking history. She has a follow-up CT chest in 4 weeks for serial follow-up.  Plan  Patient Instructions  Continue on BREZTRI 2 puffs Twice daily  , rinse after use.  Albuterol neb 1-2 times daily for at least 2 weeks and As needed   Begin flutter valve Three times a day    Continue on Prednisone '5mg'$  daily  Continue on Daliresp daily  Continue on Oxygen 2l/m at rest and 3 L with activity Activity as tolerated.  High protein diet .  Activity as tolerated.  CT chest next month as planned.  Follow up with Dr. Valeta Harms early July as planned and As needed   Please contact office for sooner follow up if symptoms do not improve or worsen or seek emergency care       Protein-calorie malnutrition, severe Encourage patient on high-protein diet.  Using Ensure between  meals.  Patient is on Daliresp which can decrease appetite.  Could consider decreasing dose.  We talked about holding for drug holiday and lower dosing however patient declines at this time says that she has been on this for a while does not feel like this is the reason for her weight loss.  Says that her appetite is improved and she is eating better since getting out of the hospital.  We will need to continue to monitor closely.    I spent  41  minutes dedicated to the care of this patient on the date of this encounter to include pre-visit review of records, face-to-face time with the patient discussing conditions above, post visit ordering of testing, clinical documentation with the electronic health record, making appropriate referrals as documented, and communicating necessary findings to members of the patients care team.   Rexene Edison, NP 01/24/2022

## 2022-01-24 NOTE — Patient Instructions (Addendum)
Continue on BREZTRI 2 puffs Twice daily  , rinse after use.  Albuterol neb 1-2 times daily for at least 2 weeks and As needed   Begin flutter valve Three times a day    Continue on Prednisone '5mg'$  daily  Continue on Daliresp daily  Continue on Oxygen 2l/m at rest and 3 L with activity Activity as tolerated.  High protein diet .  Activity as tolerated.  CT chest next month as planned.  Follow up with Dr. Valeta Harms early July as planned and As needed   Please contact office for sooner follow up if symptoms do not improve or worsen or seek emergency care

## 2022-01-28 ENCOUNTER — Other Ambulatory Visit: Payer: Self-pay | Admitting: *Deleted

## 2022-01-28 MED ORDER — ALBUTEROL SULFATE HFA 108 (90 BASE) MCG/ACT IN AERS: 2.0000 | INHALATION_SPRAY | Freq: Four times a day (QID) | RESPIRATORY_TRACT | 6 refills | Status: DC | PRN

## 2022-01-29 ENCOUNTER — Telehealth: Payer: Self-pay | Admitting: Adult Health

## 2022-01-29 NOTE — Telephone Encounter (Signed)
ATC patient. VM was full. Albuterol neb solution was sent in to [facility] [address] [facility] on 01/24/22 by mandi.  Patient should check with pharmacy.

## 2022-02-10 ENCOUNTER — Emergency Department (HOSPITAL_BASED_OUTPATIENT_CLINIC_OR_DEPARTMENT_OTHER): Payer: Medicare Other

## 2022-02-10 ENCOUNTER — Encounter (HOSPITAL_BASED_OUTPATIENT_CLINIC_OR_DEPARTMENT_OTHER): Payer: Self-pay | Admitting: Emergency Medicine

## 2022-02-10 ENCOUNTER — Other Ambulatory Visit: Payer: Self-pay

## 2022-02-10 ENCOUNTER — Emergency Department (HOSPITAL_BASED_OUTPATIENT_CLINIC_OR_DEPARTMENT_OTHER)
Admission: EM | Admit: 2022-02-10 | Discharge: 2022-02-10 | Disposition: A | Discharge status: Home / Self Care | Payer: Medicare Other | Attending: Emergency Medicine | Admitting: Emergency Medicine

## 2022-02-10 DIAGNOSIS — R0602 Shortness of breath: Secondary | ICD-10-CM | POA: Insufficient documentation

## 2022-02-10 DIAGNOSIS — Z7951 Long term (current) use of inhaled steroids: Secondary | ICD-10-CM | POA: Diagnosis not present

## 2022-02-10 DIAGNOSIS — Z7982 Long term (current) use of aspirin: Secondary | ICD-10-CM | POA: Diagnosis not present

## 2022-02-10 DIAGNOSIS — Z79899 Other long term (current) drug therapy: Secondary | ICD-10-CM | POA: Insufficient documentation

## 2022-02-10 DIAGNOSIS — E039 Hypothyroidism, unspecified: Secondary | ICD-10-CM | POA: Insufficient documentation

## 2022-02-10 DIAGNOSIS — I10 Essential (primary) hypertension: Secondary | ICD-10-CM | POA: Diagnosis not present

## 2022-02-10 DIAGNOSIS — D696 Thrombocytopenia, unspecified: Secondary | ICD-10-CM | POA: Insufficient documentation

## 2022-02-10 DIAGNOSIS — I2510 Atherosclerotic heart disease of native coronary artery without angina pectoris: Secondary | ICD-10-CM | POA: Diagnosis not present

## 2022-02-10 DIAGNOSIS — R0789 Other chest pain: Secondary | ICD-10-CM | POA: Insufficient documentation

## 2022-02-10 DIAGNOSIS — Z7952 Long term (current) use of systemic steroids: Secondary | ICD-10-CM | POA: Diagnosis not present

## 2022-02-10 DIAGNOSIS — J449 Chronic obstructive pulmonary disease, unspecified: Secondary | ICD-10-CM | POA: Insufficient documentation

## 2022-02-10 DIAGNOSIS — R079 Chest pain, unspecified: Secondary | ICD-10-CM

## 2022-02-10 LAB — CBC
HCT: 36.8 % (ref 36.0–46.0)
Hemoglobin: 12.3 g/dL (ref 12.0–15.0)
MCH: 31.1 pg (ref 26.0–34.0)
MCHC: 33.4 g/dL (ref 30.0–36.0)
MCV: 93.2 fL (ref 80.0–100.0)
Platelets: 453 10*3/uL — ABNORMAL HIGH (ref 150–400)
RBC: 3.95 MIL/uL (ref 3.87–5.11)
RDW: 16.5 % — ABNORMAL HIGH (ref 11.5–15.5)
WBC: 6.6 10*3/uL (ref 4.0–10.5)
nRBC: 0 % (ref 0.0–0.2)

## 2022-02-10 LAB — BASIC METABOLIC PANEL
Anion gap: 7 (ref 5–15)
BUN: 15 mg/dL (ref 8–23)
CO2: 31 mmol/L (ref 22–32)
Calcium: 9.5 mg/dL (ref 8.9–10.3)
Chloride: 99 mmol/L (ref 98–111)
Creatinine, Ser: 0.75 mg/dL (ref 0.44–1.00)
GFR, Estimated: >60 mL/min (ref >60)
Glucose, Bld: 111 mg/dL — ABNORMAL HIGH (ref 70–99)
Potassium: 4.4 mmol/L (ref 3.5–5.1)
Sodium: 137 mmol/L (ref 135–145)

## 2022-02-10 LAB — TROPONIN I (HIGH SENSITIVITY)
Troponin I (High Sensitivity): 2 ng/L (ref <18)
Troponin I (High Sensitivity): 2 ng/L (ref <18)

## 2022-02-10 NOTE — ED Provider Notes (Signed)
[facility] Provider Note   CSN: [id number] Arrival date & time: 02/10/22  1001     History  Chief Complaint  Patient presents with   Shortness of Breath    Cheryl Haley is a 66 y.o. female with pmh of advanced COPD, chronic respiratory failure with hypoxia on 2 L/min [place] oxygen, hypothyroidism, essential hypertension and hyperlipidemia presenting today with complaint of left-sided chest discomfort since yesterday.  Says that it is in her flank and sometimes radiates to her left chest right under her rib cage.  Denies any increased dyspnea from her baseline due to her emphysema.  Says the pain is not worse with breathing but sometimes worse with moving.  Said that the last time she had this type of feeling she had pneumonia so she wanted to get it checked out.  No history of recent travel, surgery, history of DVT/PE, leg swelling or hemoptysis.  Says that she only coughs occasionally and is nonproductive.  No fevers, chills, night sweats, nausea, vomiting or urinary symptoms.  No history of kidney stone.  No longer smokes cigarettes.    Shortness of Breath Associated symptoms: chest pain   Associated symptoms: no cough and no fever        Home Medications Prior to Admission medications   Medication Sig Start Date End Date Taking? Authorizing Provider  albuterol (PROVENTIL) (2.5 MG/3ML) 0.083% nebulizer solution 1-2 times daily for at least 2 weeks and As needed 01/24/22   Parrett, Tammy S, NP  albuterol (VENTOLIN HFA) 108 (90 Base) MCG/ACT inhaler Inhale 2 puffs into the lungs every 6 (six) hours as needed for wheezing or shortness of breath. 09/14/20   Lauraine Rinne, NP  albuterol (VENTOLIN HFA) 108 (90 Base) MCG/ACT inhaler Inhale 2 puffs into the lungs every 6 (six) hours as needed for wheezing or shortness of breath. 01/28/22   Parrett, Fonnie Mu, NP  alendronate (FOSAMAX) 70 MG tablet Take 70 mg by mouth every Saturday. 06/13/21   [provider]  aspirin EC 81 MG tablet Take 81 mg daily by mouth.     [provider]  Bempedoic Acid-Ezetimibe (NEXLIZET) 180-10 MG TABS Take 1 tablet by mouth daily. 09/20/21   Sueanne Margarita, MD  Budeson-Glycopyrrol-Formoterol (BREZTRI AEROSPHERE) 160-9-4.8 MCG/ACT AERO INHALE 2 PUFFS INTO LUNGS IN THE MORNING AND AT BEDTIME Patient taking differently: Inhale 2 puffs into the lungs 2 (two) times daily. 01/03/22   Brand Males, MD  feeding supplement (ENSURE ENLIVE / ENSURE PLUS) LIQD Take 237 mLs by mouth 2 (two) times daily between meals. Patient taking differently: Take 237 mLs by mouth daily. 08/28/20   Debbe Odea, MD  hydroxyurea (HYDREA) 500 MG capsule TAKE 1 CAPSULE BY MOUTH EVERY OTHER DAY TAKE WITH FOOD TO MINIMIZE GI SIDE EFFECTS Patient taking differently: Take 500 mg by mouth every other day. 09/25/21   Volanda Napoleon, MD  hydrOXYzine (ATARAX) 25 MG tablet Take 25 mg by mouth at bedtime as needed (sleep).    [provider]  levothyroxine (SYNTHROID) 75 MCG tablet Take 75 mcg by mouth every morning. 04/24/21   [provider]  metoprolol succinate (TOPROL-XL) 25 MG 24 hr tablet Take 1 tablet (25 mg total) by mouth daily. 07/16/21   Sueanne Margarita, MD  Multiple Vitamin (MULTIVITAMIN WITH MINERALS) TABS tablet Take 1 tablet by mouth daily.    [provider]  OXYGEN Inhale 2 L into the lungs continuous.    [provider]  predniSONE (DELTASONE) 5 MG tablet Take 1 tablet (5 mg total) by mouth daily with breakfast. 01/18/22   Hongalgi, Lenis Dickinson, MD  roflumilast (DALIRESP) 500 MCG TABS tablet Take 1 tablet by mouth once daily Patient taking differently: Take 500 mcg by mouth daily. 06/11/21   Brand Males, MD  rosuvastatin (CRESTOR) 40 MG tablet Take 1 tablet (40 mg total) by mouth daily. 07/16/21   Sueanne Margarita, MD      Allergies    Bisoprolol fumarate    Review of Systems   Review of Systems  Constitutional:  Negative for chills and  fever.  Respiratory:  Negative for cough, chest tightness and shortness of breath.   Cardiovascular:  Positive for chest pain. Negative for palpitations.  Neurological:  Negative for dizziness.    Physical Exam Updated Vital Signs BP (!) 142/59   Pulse 72   Temp 98 F (36.7 C) (Oral)   Resp (!) 28   Ht '5\' 4"'$  (1.626 m)   Wt 43.1 kg   SpO2 96%   BMI 16.31 kg/m  Physical Exam Vitals and nursing note reviewed.  Constitutional:      General: She is not in acute distress.    Appearance: Normal appearance. She is not ill-appearing.  HENT:     Head: Normocephalic and atraumatic.  Eyes:     General: No scleral icterus.    Conjunctiva/sclera: Conjunctivae normal.  Cardiovascular:     Rate and Rhythm: Normal rate and regular rhythm.  Pulmonary:     Effort: Pulmonary effort is normal. No respiratory distress.     Breath sounds: No decreased breath sounds, wheezing, rhonchi or rales.  Chest:     Chest wall: No tenderness.  Musculoskeletal:     Right lower leg: No edema.     Left lower leg: No edema.     Comments: Full range of motion of all levels of the spine.  Negative CVA tenderness.  No paraspinal tenderness.  Skin:    General: Skin is warm and dry.     Findings: No rash.  Neurological:     Mental Status: She is alert.  Psychiatric:        Mood and Affect: Mood normal.     ED Results / Procedures / Treatments   Labs (all labs ordered are listed, but only abnormal results are displayed) Labs Reviewed  BASIC METABOLIC PANEL - Abnormal; Notable for the following components:      Result Value   Glucose, Bld 111 (*)    All other components within normal limits  CBC - Abnormal; Notable for the following components:   RDW 16.5 (*)    Platelets 453 (*)    All other components within normal limits  TROPONIN I (HIGH SENSITIVITY)  TROPONIN I (HIGH SENSITIVITY)    EKG EKG Interpretation  Date/Time:  Monday February 10 2022 10:27:44 EDT Ventricular Rate:  69 PR  Interval:  103 QRS Duration: 104 QT Interval:  399 QTC Calculation: 428 R Axis:   92 Text Interpretation: Sinus rhythm Short PR interval Right axis deviation Minimal ST elevation, anterior leads Since last tracing rate slower Confirmed by Dorie Rank [phone]) on 02/10/2022 10:35:11 AM  Radiology DG Chest 2 View  Result Date: 02/10/2022 CLINICAL DATA:  Shortness of breath EXAM: CHEST - 2 VIEW COMPARISON:  Radiograph and CT 01/10/2022 FINDINGS: Unchanged cardiomediastinal silhouette. Persistent right upper lobe airspace consolidation, not significantly changed from prior. Emphysema. No new airspace disease. Unchanged chronically blunted right costophrenic angle. No pneumothorax.  No acute osseous abnormality. IMPRESSION: Persistent consolidative airspace disease in the right upper lobe, not significantly changed from recent exams in may. No new airspace disease. Electronically Signed   By: Maurine Simmering M.D.   On: 02/10/2022 11:23    Procedures Procedures   Medications Ordered in ED Medications - No data to display  ED Course/ Medical Decision Making/ A&P                           Medical Decision Making Amount and/or Complexity of Data Reviewed Labs: ordered. Radiology: ordered.   This patient presents to the ED for concern of back&chest pain. The emergent differential diagnosis of chest pain includes: Acute coronary syndrome, pericarditis, aortic dissection, pulmonary embolism, tension pneumothorax, and esophageal rupture.    This is not an exhaustive differential.    Past Medical History / Co-morbidities / Social History: COPD, hypertension, hypothyroidism, CAD   Additional history: Additional history obtained from chart review.  Patient was admitted last month for acute on chronic respiratory failure.  She was diagnosed with pneumonia at that time.  Per internal medicine notes, she was complaining of similar symptoms as she is today.  Follows closely with [facility] pulmonology.    Physical Exam: Physical exam performed. The pertinent findings include:  Patient's back and left rib pain are not reproducible.  Lung sounds were clear and she was not tachycardic.  Also not tachypneic.  Negative CVA.  She also did not appear diaphoretic, denied dizziness or symptoms at this time.  Very low suspicion aortic dissection, mildly hypertensive today but she reports her blood pressure is well controlled at home.  Do not believe she needs angiogram at this time.  Lab Tests: I ordered, and personally interpreted labs.  The pertinent results include:  -No leukocytosis -Thrombocytopenia appears at baseline, history of essential thrombocytopenia. -Normal trop x2   Imaging Studies: I ordered imaging studies including chest x-ray. I independently visualized and interpreted imaging which showed a chronic consolidation in right upper lung lobe.  Not progressed from imaging during hospitalization last month.. I agree with the radiologist interpretation.  Per radiologic chart review, patient has had similar findings in the right upper lung lobe since August 2022.  This area was biopsied in 2022 with negative findings.  Doubt acute pneumonia in this area, afebrile, not tachycardic, no cough or increased tachypnea.  Additionally, her symptoms are on the left side.  Doubt association.   Cardiac Monitoring:  The patient was maintained on a cardiac monitor.  My attending physician MD. Tomi Bamberger viewed and interpreted the cardiac monitored which showed an underlying rhythm of: NSR   Medications: Patient declined need for breathing treatment or pain control at this time.  Says that she currently does not have any pain and does not feel any more short of breath than usual.  This is reasonable from my standpoint as her lung sounds are clear.  I have reviewed the patients home medicines and have made adjustments as needed.     Disposition: This is a 66 year old female with a history of CAD, COPD and  hypertension presenting today with back pain that radiates to her chest.  She is stable appearing and vital signs are not concerning for aortic dissection however this was considered due to her hypertension and CAD history.  Per chart review she has never been diagnosed with an AAA.  I have a low suspicion of this and will not pursue a work-up at this time.  Will not pursue PE work-up, low likelihood [facility].  EKG was without signs of ischemia or infarction and troponin -x2.  Heart score  4. After consideration of the diagnostic results and the patients response to treatment, I feel that patient is stable for discharge home with outpatient follow-up for continued symptoms..  She is agreeable to this.  She only wanted to know that she did not have pneumonia.  She will follow-up with her pulmonologist with any further concerns.  Ambulated out of the department asymptomatic and in stable condition   I discussed this case with my attending physician Dr. Tomi Bamberger who cosigned this note including patient's presenting symptoms, physical exam, and planned diagnostics and interventions. Attending physician stated agreement with plan or made changes to plan which were implemented.      Final Clinical Impression(s) / ED Diagnoses Final diagnoses:  Chest pain, unspecified type    Rx / DC Orders   Results and diagnoses were explained to the patient. Return precautions discussed in full. Patient had no additional questions and expressed complete understanding.   This chart was dictated using voice recognition software.  Despite best efforts to proofread,  errors can occur which can change the documentation meaning.     Darliss Ridgel 02/10/22 1355    Dorie Rank, MD 02/11/22 [phone]

## 2022-02-10 NOTE — Discharge Instructions (Addendum)
You do not have pneumonia today.  Your second heart lab was normal.  Follow-up with your primary care provider if you continue to have these symptoms.  Continue to use your inhalers as prescribed and if they are not working, speak with your pulmonologist.  Please do not hesitate to return with any worsening symptoms.

## 2022-02-10 NOTE — ED Triage Notes (Signed)
SOB since yesterday. Also reports back and chest pain. She wears O2 at home.

## 2022-02-14 ENCOUNTER — Other Ambulatory Visit: Payer: Self-pay

## 2022-02-14 ENCOUNTER — Emergency Department (HOSPITAL_BASED_OUTPATIENT_CLINIC_OR_DEPARTMENT_OTHER)
Admission: EM | Admit: 2022-02-14 | Discharge: 2022-02-14 | Disposition: A | Discharge status: Home / Self Care | Payer: Medicare Other | Attending: Emergency Medicine | Admitting: Emergency Medicine

## 2022-02-14 ENCOUNTER — Encounter (HOSPITAL_BASED_OUTPATIENT_CLINIC_OR_DEPARTMENT_OTHER): Payer: Self-pay

## 2022-02-14 DIAGNOSIS — Z79899 Other long term (current) drug therapy: Secondary | ICD-10-CM | POA: Insufficient documentation

## 2022-02-14 DIAGNOSIS — Z7982 Long term (current) use of aspirin: Secondary | ICD-10-CM | POA: Insufficient documentation

## 2022-02-14 DIAGNOSIS — Z7951 Long term (current) use of inhaled steroids: Secondary | ICD-10-CM | POA: Insufficient documentation

## 2022-02-14 DIAGNOSIS — R21 Rash and other nonspecific skin eruption: Secondary | ICD-10-CM | POA: Diagnosis present

## 2022-02-14 DIAGNOSIS — J449 Chronic obstructive pulmonary disease, unspecified: Secondary | ICD-10-CM | POA: Insufficient documentation

## 2022-02-14 DIAGNOSIS — I10 Essential (primary) hypertension: Secondary | ICD-10-CM | POA: Insufficient documentation

## 2022-02-14 DIAGNOSIS — B029 Zoster without complications: Secondary | ICD-10-CM

## 2022-02-14 MED ORDER — PREDNISONE 10 MG PO TABS: 20.0000 mg | ORAL_TABLET | Freq: Two times a day (BID) | ORAL | 0 refills | Status: DC

## 2022-02-14 MED ORDER — HYDROCODONE-ACETAMINOPHEN 5-325 MG PO TABS
2.0000 | ORAL_TABLET | Freq: Once | ORAL | Status: AC
  Administered 2022-02-14: 2 via ORAL
  Filled 2022-02-14: qty 2

## 2022-02-14 MED ORDER — HYDROCODONE-ACETAMINOPHEN 5-325 MG PO TABS: 1.0000 | ORAL_TABLET | Freq: Four times a day (QID) | ORAL | 0 refills | Status: DC | PRN

## 2022-02-14 MED ORDER — VALACYCLOVIR HCL 1 G PO TABS: 1000.0000 mg | ORAL_TABLET | Freq: Three times a day (TID) | ORAL | 0 refills | Status: DC

## 2022-02-14 NOTE — ED Provider Notes (Signed)
[facility] DEPARTMENT Provider Note   CSN: [id number] Arrival date & time: 02/14/22  0429     History  Chief Complaint  Patient presents with   Rash    Cheryl Haley is a 66 y.o. female.  Patient is a 66 year old female with history of COPD on home oxygen, hypertension.  Patient presenting today with complaints of rash.  She was seen 4 days ago with pain to her left flank that went undiagnosed.  Then the rash appeared yesterday.  She denies fevers or chills.  Pain is worse with any palpation and change in position.  There are no alleviating factors.  She has tried Tylenol with little relief.  The history is provided by the patient.       Home Medications Prior to Admission medications   Medication Sig Start Date End Date Taking? Authorizing Provider  albuterol (PROVENTIL) (2.5 MG/3ML) 0.083% nebulizer solution 1-2 times daily for at least 2 weeks and As needed 01/24/22   Parrett, Tammy S, NP  albuterol (VENTOLIN HFA) 108 (90 Base) MCG/ACT inhaler Inhale 2 puffs into the lungs every 6 (six) hours as needed for wheezing or shortness of breath. 09/14/20   Lauraine Rinne, NP  albuterol (VENTOLIN HFA) 108 (90 Base) MCG/ACT inhaler Inhale 2 puffs into the lungs every 6 (six) hours as needed for wheezing or shortness of breath. 01/28/22   Parrett, Fonnie Mu, NP  alendronate (FOSAMAX) 70 MG tablet Take 70 mg by mouth every Saturday. 06/13/21   [provider]  aspirin EC 81 MG tablet Take 81 mg daily by mouth.     [provider]  Bempedoic Acid-Ezetimibe (NEXLIZET) 180-10 MG TABS Take 1 tablet by mouth daily. 09/20/21   Sueanne Margarita, MD  Budeson-Glycopyrrol-Formoterol (BREZTRI AEROSPHERE) 160-9-4.8 MCG/ACT AERO INHALE 2 PUFFS INTO LUNGS IN THE MORNING AND AT BEDTIME Patient taking differently: Inhale 2 puffs into the lungs 2 (two) times daily. 01/03/22   Brand Males, MD  feeding supplement (ENSURE ENLIVE / ENSURE PLUS) LIQD Take 237 mLs by mouth 2  (two) times daily between meals. Patient taking differently: Take 237 mLs by mouth daily. 08/28/20   Debbe Odea, MD  hydroxyurea (HYDREA) 500 MG capsule TAKE 1 CAPSULE BY MOUTH EVERY OTHER DAY TAKE WITH FOOD TO MINIMIZE GI SIDE EFFECTS Patient taking differently: Take 500 mg by mouth every other day. 09/25/21   Volanda Napoleon, MD  hydrOXYzine (ATARAX) 25 MG tablet Take 25 mg by mouth at bedtime as needed (sleep).    [provider]  levothyroxine (SYNTHROID) 75 MCG tablet Take 75 mcg by mouth every morning. 04/24/21   [provider]  metoprolol succinate (TOPROL-XL) 25 MG 24 hr tablet Take 1 tablet (25 mg total) by mouth daily. 07/16/21   Sueanne Margarita, MD  Multiple Vitamin (MULTIVITAMIN WITH MINERALS) TABS tablet Take 1 tablet by mouth daily.    [provider]  OXYGEN Inhale 2 L into the lungs continuous.    [provider]  predniSONE (DELTASONE) 5 MG tablet Take 1 tablet (5 mg total) by mouth daily with breakfast. 01/18/22   Hongalgi, Lenis Dickinson, MD  roflumilast (DALIRESP) 500 MCG TABS tablet Take 1 tablet by mouth once daily Patient taking differently: Take 500 mcg by mouth daily. 06/11/21   Brand Males, MD  rosuvastatin (CRESTOR) 40 MG tablet Take 1 tablet (40 mg total) by mouth daily. 07/16/21   Sueanne Margarita, MD      Allergies    Bisoprolol  fumarate    Review of Systems   Review of Systems  All other systems reviewed and are negative.   Physical Exam Updated Vital Signs BP (!) 161/70   Pulse 77   Resp (!) 26   Ht '5\' 4"'$  (1.626 m)   Wt 43.1 kg   SpO2 96%   BMI 16.31 kg/m  Physical Exam Vitals and nursing note reviewed.  Constitutional:      General: She is not in acute distress.    Appearance: Normal appearance. She is not ill-appearing.  HENT:     Head: Normocephalic and atraumatic.  Pulmonary:     Effort: Pulmonary effort is normal.  Skin:    General: Skin is warm and dry.     Comments: There is a vesicular rash noted  to the left flank in a dermatomal pattern.  Neurological:     Mental Status: She is alert.     ED Results / Procedures / Treatments   Labs (all labs ordered are listed, but only abnormal results are displayed) Labs Reviewed - No data to display  EKG None  Radiology No results found.  Procedures Procedures    Medications Ordered in ED Medications - No data to display  ED Course/ Medical Decision Making/ A&P  Presenting with what appears to be shingles to the left flank.  This will be treated with Valtrex, prednisone, pain medication, and follow-up with primary doctor.  Final Clinical Impression(s) / ED Diagnoses Final diagnoses:  None    Rx / DC Orders ED Discharge Orders     None         Veryl Speak, MD 02/14/22 [phone]

## 2022-02-14 NOTE — ED Triage Notes (Signed)
Reports rash on left ribs, chest and back. Seen recently for pain that began 5 days ago. Rash developed yesterday.

## 2022-02-14 NOTE — Discharge Instructions (Signed)
Begin taking Valtrex and prednisone as prescribed.  Begin taking hydrocodone as prescribed as needed for pain.  Follow-up with primary doctor in the next 1 to 2 weeks, and return to the ER if symptoms significantly worsen or change.

## 2022-02-26 ENCOUNTER — Ambulatory Visit (HOSPITAL_BASED_OUTPATIENT_CLINIC_OR_DEPARTMENT_OTHER)
Admission: RE | Admit: 2022-02-26 | Discharge: 2022-02-26 | Disposition: A | Discharge status: Home / Self Care | Payer: Medicare Other | Source: Ambulatory Visit | Attending: Pulmonary Disease | Admitting: Pulmonary Disease

## 2022-02-26 DIAGNOSIS — J432 Centrilobular emphysema: Secondary | ICD-10-CM | POA: Diagnosis not present

## 2022-02-26 DIAGNOSIS — I3481 Nonrheumatic mitral (valve) annulus calcification: Secondary | ICD-10-CM | POA: Insufficient documentation

## 2022-02-26 DIAGNOSIS — I700 Atherosclerosis of aorta: Secondary | ICD-10-CM | POA: Diagnosis not present

## 2022-02-26 DIAGNOSIS — R911 Solitary pulmonary nodule: Secondary | ICD-10-CM

## 2022-02-26 DIAGNOSIS — I2510 Atherosclerotic heart disease of native coronary artery without angina pectoris: Secondary | ICD-10-CM | POA: Diagnosis not present

## 2022-03-12 ENCOUNTER — Ambulatory Visit: Payer: Medicare Other | Admitting: Pulmonary Disease

## 2022-03-25 ENCOUNTER — Inpatient Hospital Stay: Payer: Medicare Other | Attending: Hematology & Oncology

## 2022-03-25 ENCOUNTER — Inpatient Hospital Stay (HOSPITAL_BASED_OUTPATIENT_CLINIC_OR_DEPARTMENT_OTHER): Payer: Medicare Other | Admitting: Family

## 2022-03-25 ENCOUNTER — Encounter: Payer: Self-pay | Admitting: Family

## 2022-03-25 VITALS — BP 154/52 | HR 94 | Temp 97.6°F | Resp 20 | Wt 91.1 lb

## 2022-03-25 DIAGNOSIS — D75839 Thrombocytosis, unspecified: Secondary | ICD-10-CM | POA: Insufficient documentation

## 2022-03-25 DIAGNOSIS — Z7982 Long term (current) use of aspirin: Secondary | ICD-10-CM | POA: Diagnosis not present

## 2022-03-25 DIAGNOSIS — D473 Essential (hemorrhagic) thrombocythemia: Secondary | ICD-10-CM

## 2022-03-25 DIAGNOSIS — D509 Iron deficiency anemia, unspecified: Secondary | ICD-10-CM

## 2022-03-25 LAB — IRON AND IRON BINDING CAPACITY (CC-WL,HP ONLY)
Iron: 54 ug/dL (ref 28–170)
Saturation Ratios: 17 % (ref 10.4–31.8)
TIBC: 326 ug/dL (ref 250–450)
UIBC: 272 ug/dL (ref 148–442)

## 2022-03-25 LAB — CMP (CANCER CENTER ONLY)
ALT: 18 U/L (ref 0–44)
AST: 23 U/L (ref 15–41)
Albumin: 4.5 g/dL (ref 3.5–5.0)
Alkaline Phosphatase: 51 U/L (ref 38–126)
Anion gap: 11 (ref 5–15)
BUN: 15 mg/dL (ref 8–23)
CO2: 30 mmol/L (ref 22–32)
Calcium: 10.1 mg/dL (ref 8.9–10.3)
Chloride: 100 mmol/L (ref 98–111)
Creatinine: 0.84 mg/dL (ref 0.44–1.00)
GFR, Estimated: >60 mL/min (ref >60)
Glucose, Bld: 110 mg/dL — ABNORMAL HIGH (ref 70–99)
Potassium: 4.7 mmol/L (ref 3.5–5.1)
Sodium: 141 mmol/L (ref 135–145)
Total Bilirubin: 0.4 mg/dL (ref 0.3–1.2)
Total Protein: 7.9 g/dL (ref 6.5–8.1)

## 2022-03-25 LAB — CBC WITH DIFFERENTIAL (CANCER CENTER ONLY)
Abs Immature Granulocytes: 0.06 10*3/uL (ref 0.00–0.07)
Basophils Absolute: 0.1 10*3/uL (ref 0.0–0.1)
Basophils Relative: 1 %
Eosinophils Absolute: 0 10*3/uL (ref 0.0–0.5)
Eosinophils Relative: 1 %
HCT: 39.9 % (ref 36.0–46.0)
Hemoglobin: 12.6 g/dL (ref 12.0–15.0)
Immature Granulocytes: 1 %
Lymphocytes Relative: 20 %
Lymphs Abs: 1.7 10*3/uL (ref 0.7–4.0)
MCH: 30.6 pg (ref 26.0–34.0)
MCHC: 31.6 g/dL (ref 30.0–36.0)
MCV: 96.8 fL (ref 80.0–100.0)
Monocytes Absolute: 0.7 10*3/uL (ref 0.1–1.0)
Monocytes Relative: 8 %
Neutro Abs: 5.9 10*3/uL (ref 1.7–7.7)
Neutrophils Relative %: 69 %
Platelet Count: 545 10*3/uL — ABNORMAL HIGH (ref 150–400)
RBC: 4.12 MIL/uL (ref 3.87–5.11)
RDW: 17.2 % — ABNORMAL HIGH (ref 11.5–15.5)
WBC Count: 8.4 10*3/uL (ref 4.0–10.5)
nRBC: 0 % (ref 0.0–0.2)

## 2022-03-25 LAB — LACTATE DEHYDROGENASE: LDH: 162 U/L (ref 98–192)

## 2022-03-25 LAB — FERRITIN: Ferritin: 120 ng/mL (ref 11–307)

## 2022-03-25 NOTE — Progress Notes (Signed)
Hematology and Oncology Follow Up Visit  Cheryl Haley [id number] 04-26-1956 66 y.o. 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 She is suffering from post herpetic neuralgia pain in the left side. She has an appointment today with her PCP for further eval and adjustment to her Neurontin.  She is doing well on supplement O2 24 hours a day and states that her SOB remains stable.  No fever, chills, n/v, cough, rash, dizziness, chest pain, palpitations or changes in bowel or bladder habits.  No swelling, numbness or tingling in her extremities. No falls or syncope reported. She is ambulating with a Rolator for added support.  Appetite and hydration have been fair. Her weight is 91 lbs.   ECOG Performance Status: 1 - Symptomatic but completely ambulatory  Medications:  Allergies as of 03/25/2022       Reactions   Bisoprolol Fumarate    Other reaction(s): heart issues        Medication List        Accurate as of March 25, 2022  9:57 AM. If you have any questions, ask your nurse or doctor.          albuterol 108 (90 Base) MCG/ACT inhaler Commonly known as: VENTOLIN HFA Inhale 2 puffs into the lungs every 6 (six) hours as needed for wheezing or shortness of breath.   albuterol (2.5 MG/3ML) 0.083% nebulizer solution Commonly known as: PROVENTIL 1-2 times daily for at least 2 weeks and As needed   albuterol 108 (90 Base) MCG/ACT inhaler Commonly known as: VENTOLIN HFA Inhale 2 puffs into the lungs every 6 (six) hours as needed for wheezing or shortness of breath.   alendronate 70 MG tablet Commonly known as: FOSAMAX Take 70 mg by mouth every Saturday.   aspirin EC 81 MG tablet Take 81 mg daily by mouth.   Breztri Aerosphere 160-9-4.8 MCG/ACT  Aero Generic drug: Budeson-Glycopyrrol-Formoterol INHALE 2 PUFFS INTO LUNGS IN THE MORNING AND AT BEDTIME What changed: See the new instructions.   Daliresp 500 MCG Tabs tablet Generic drug: roflumilast Take 1 tablet by mouth once daily What changed: how much to take   feeding supplement Liqd Take 237 mLs by mouth 2 (two) times daily between meals. What changed: when to take this   HYDROcodone-acetaminophen 5-325 MG tablet Commonly known as: Norco Take 1-2 tablets by mouth every 6 (six) hours as needed.   hydroxyurea 500 MG capsule Commonly known as: HYDREA TAKE 1 CAPSULE BY MOUTH EVERY OTHER DAY TAKE WITH FOOD TO MINIMIZE GI SIDE EFFECTS What changed:  how much to take how to take this when to take this additional instructions   hydrOXYzine 25 MG tablet Commonly known as: ATARAX Take 25 mg by mouth at bedtime as needed (sleep).   levothyroxine 75 MCG tablet Commonly known as: SYNTHROID Take 75 mcg by mouth every morning.   metoprolol succinate 25 MG 24 hr tablet Commonly known as: TOPROL-XL Take 1 tablet (25 mg total) by mouth daily.   multivitamin with minerals Tabs tablet Take 1 tablet by mouth daily.   Nexlizet 180-10 MG Tabs Generic drug: Bempedoic Acid-Ezetimibe Take 1 tablet by mouth daily.   OXYGEN Inhale 2 L into the lungs continuous.   predniSONE 10 MG tablet Commonly known as: DELTASONE Take 2  tablets (20 mg total) by mouth 2 (two) times daily.   rosuvastatin 40 MG tablet Commonly known as: CRESTOR Take 1 tablet (40 mg total) by mouth daily.   valACYclovir 1000 MG tablet Commonly known as: VALTREX Take 1 tablet (1,000 mg total) by mouth 3 (three) times daily.        Allergies:  Allergies  Allergen Reactions   Bisoprolol Fumarate     Other reaction(s): heart issues    Past Medical History, Surgical history, Social history, and Family History were reviewed and updated.  Review of Systems: All other 10 point review of systems is  negative.   Physical Exam:  weight is 91 lb 1.3 oz (41.3 kg). Her oral temperature is 97.6 F (36.4 C). Her blood pressure is 154/52 (abnormal) and her pulse is 94. Her respiration is 20 and oxygen saturation is 97%.   Wt Readings from Last 3 Encounters:  03/25/22 91 lb 1.3 oz (41.3 kg)  02/14/22 95 lb (43.1 kg)  02/10/22 95 lb (43.1 kg)    Ocular: Sclerae unicteric, pupils equal, round and reactive to light Ear-nose-throat: Oropharynx clear, dentition fair Lymphatic: No cervical or supraclavicular adenopathy Lungs no rales or rhonchi, good excursion bilaterally Heart regular rate and rhythm, no murmur appreciated Abd soft, nontender, positive bowel sounds MSK no focal spinal tenderness, no joint edema Neuro: non-focal, well-oriented, appropriate affect Breasts: Deferred   Lab Results  Component Value Date   WBC 8.4 03/25/2022   HGB 12.6 03/25/2022   HCT 39.9 03/25/2022   MCV 96.8 03/25/2022   PLT 545 (H) 03/25/2022   Lab Results  Component Value Date   FERRITIN 120 03/26/2021   IRON 105 03/26/2021   TIBC 330 03/26/2021   UIBC 226 03/26/2021   IRONPCTSAT 32 03/26/2021   Lab Results  Component Value Date   RBC 4.12 03/25/2022   No results found for: "KPAFRELGTCHN", "LAMBDASER", "KAPLAMBRATIO" No results found for: "IGGSERUM", "IGA", "IGMSERUM" Lab Results  Component Value Date   TOTALPROTELP 6.8 09/23/2018   ALBUMINELP 3.9 09/23/2018   A1GS 0.1 09/23/2018   A2GS 0.9 09/23/2018   BETS 0.8 09/23/2018   GAMS 1.0 09/23/2018   MSPIKE Not Observed 09/23/2018   SPEI Comment 09/23/2018     Chemistry      Component Value Date/Time   NA 137 02/10/2022 1108   K 4.4 02/10/2022 1108   CL 99 02/10/2022 1108   CO2 31 02/10/2022 1108   BUN 15 02/10/2022 1108   CREATININE 0.75 02/10/2022 1108   CREATININE 0.82 09/25/2021 1013      Component Value Date/Time   CALCIUM 9.5 02/10/2022 1108   ALKPHOS 45 01/11/2022 0431   AST 37 01/11/2022 0431   AST 33 09/25/2021 1013    ALT 28 01/11/2022 0431   ALT 34 09/25/2021 1013   BILITOT 0.6 01/11/2022 0431   BILITOT 0.5 09/25/2021 1013       Impression and Plan: Cheryl Haley is a very pleasant 66 yo female with essential thrombocythemia, JAK2 V617F+; high risk by IPSET.  Discussed her currently symptoms and CBC with Dr. Marin Olp.  We will have her continue her same regimen with Hydrea 500 mg PO every other day.  Follow-up in 2 months.   Lottie Dawson, NP 7/25/20239:57 AM

## 2022-03-28 ENCOUNTER — Encounter (HOSPITAL_BASED_OUTPATIENT_CLINIC_OR_DEPARTMENT_OTHER): Payer: Self-pay

## 2022-03-28 ENCOUNTER — Emergency Department (HOSPITAL_BASED_OUTPATIENT_CLINIC_OR_DEPARTMENT_OTHER)
Admission: EM | Admit: 2022-03-28 | Discharge: 2022-03-28 | Disposition: A | Discharge status: Home / Self Care | Payer: Medicare Other | Attending: Emergency Medicine | Admitting: Emergency Medicine

## 2022-03-28 ENCOUNTER — Other Ambulatory Visit: Payer: Self-pay

## 2022-03-28 DIAGNOSIS — Z79899 Other long term (current) drug therapy: Secondary | ICD-10-CM | POA: Insufficient documentation

## 2022-03-28 DIAGNOSIS — R109 Unspecified abdominal pain: Secondary | ICD-10-CM | POA: Insufficient documentation

## 2022-03-28 DIAGNOSIS — J449 Chronic obstructive pulmonary disease, unspecified: Secondary | ICD-10-CM | POA: Diagnosis not present

## 2022-03-28 DIAGNOSIS — I10 Essential (primary) hypertension: Secondary | ICD-10-CM | POA: Insufficient documentation

## 2022-03-28 DIAGNOSIS — Z7982 Long term (current) use of aspirin: Secondary | ICD-10-CM | POA: Insufficient documentation

## 2022-03-28 DIAGNOSIS — Z7951 Long term (current) use of inhaled steroids: Secondary | ICD-10-CM | POA: Diagnosis not present

## 2022-03-28 DIAGNOSIS — M79622 Pain in left upper arm: Secondary | ICD-10-CM | POA: Insufficient documentation

## 2022-03-28 DIAGNOSIS — E039 Hypothyroidism, unspecified: Secondary | ICD-10-CM | POA: Insufficient documentation

## 2022-03-28 DIAGNOSIS — B0229 Other postherpetic nervous system involvement: Secondary | ICD-10-CM

## 2022-03-28 MED ORDER — SENNOSIDES-DOCUSATE SODIUM 8.6-50 MG PO TABS: 1.0000 | ORAL_TABLET | Freq: Every evening | ORAL | 0 refills | Status: DC | PRN

## 2022-03-28 MED ORDER — OXYCODONE-ACETAMINOPHEN 5-325 MG PO TABS: 1.0000 | ORAL_TABLET | Freq: Four times a day (QID) | ORAL | 0 refills | Status: DC | PRN

## 2022-03-28 NOTE — ED Provider Notes (Signed)
[facility]PART[facility] Provider Note   CSN: [id number] Arrival date & time: 03/28/22  1548     History  Chief Complaint  Patient presents with   Back Pain    Cheryl Haley is a 66 y.o. female with history of COPD, HTN, TIA, hypothyroidism, and shingles that presents with left-sided axillary pain that radiates to her lower abdomen and left flank.  She states that the pain is at the surface of her skin and is painful to the touch. The patient states that this pain is the exact same as the pain she has been experiencing for the past 7 weeks since she was first diagnosed with shingles.  She states that she developed a rash at the time when her symptoms first began but that this rash has improved since she was diagnosed.  Patient denies any erythema, swelling, or drainage near the area of her rash or elsewhere.  The patient states that she has followed up with her primary care doctor for her symptoms.  She states that she last saw her primary care doctor 3 days ago where her dosage of gabapentin was increased to 1800 mg/day.  She states that this increase in dose of her gabapentin has not improved her symptoms.  She states that her symptoms only improve with Percocet which she was not prescribed on her last visit to her PCP.   The patient denies recent trauma or injuries. The patient denies fever, chills, nausea, vomiting, diarrhea, chest pain, shortness of breath, swelling, headache, dizziness, lightheadedness.  The history is provided by the patient.  Back Pain Associated symptoms: abdominal pain   Associated symptoms: no chest pain, no fever and no headaches        Home Medications Prior to Admission medications   Medication Sig Start Date End Date Taking? Authorizing Provider  albuterol (PROVENTIL) (2.5 MG/3ML) 0.083% nebulizer solution 1-2 times daily for at least 2 weeks and As needed 01/24/22   Parrett, Tammy S, NP  albuterol (VENTOLIN HFA) 108 (90 Base)  MCG/ACT inhaler Inhale 2 puffs into the lungs every 6 (six) hours as needed for wheezing or shortness of breath. 09/14/20   Lauraine Rinne, NP  albuterol (VENTOLIN HFA) 108 (90 Base) MCG/ACT inhaler Inhale 2 puffs into the lungs every 6 (six) hours as needed for wheezing or shortness of breath. 01/28/22   Parrett, Fonnie Mu, NP  alendronate (FOSAMAX) 70 MG tablet Take 70 mg by mouth every Saturday. 06/13/21   [provider]  aspirin EC 81 MG tablet Take 81 mg daily by mouth.     [provider]  Bempedoic Acid-Ezetimibe (NEXLIZET) 180-10 MG TABS Take 1 tablet by mouth daily. 09/20/21   Sueanne Margarita, MD  Budeson-Glycopyrrol-Formoterol (BREZTRI AEROSPHERE) 160-9-4.8 MCG/ACT AERO INHALE 2 PUFFS INTO LUNGS IN THE MORNING AND AT BEDTIME Patient taking differently: Inhale 2 puffs into the lungs 2 (two) times daily. 01/03/22   Brand Males, MD  feeding supplement (ENSURE ENLIVE / ENSURE PLUS) LIQD Take 237 mLs by mouth 2 (two) times daily between meals. Patient taking differently: Take 237 mLs by mouth daily. 08/28/20   Debbe Odea, MD  HYDROcodone-acetaminophen (NORCO) 5-325 MG tablet Take 1-2 tablets by mouth every 6 (six) hours as needed. 02/14/22   Veryl Speak, MD  hydroxyurea (HYDREA) 500 MG capsule TAKE 1 CAPSULE BY MOUTH EVERY OTHER DAY TAKE WITH FOOD TO MINIMIZE GI SIDE EFFECTS Patient taking differently: Take 500 mg by mouth every other day. 09/25/21   Burney Gauze  R, MD  hydrOXYzine (ATARAX) 25 MG tablet Take 25 mg by mouth at bedtime as needed (sleep).    [provider]  levothyroxine (SYNTHROID) 75 MCG tablet Take 75 mcg by mouth every morning. 04/24/21   [provider]  metoprolol succinate (TOPROL-XL) 25 MG 24 hr tablet Take 1 tablet (25 mg total) by mouth daily. 07/16/21   Sueanne Margarita, MD  Multiple Vitamin (MULTIVITAMIN WITH MINERALS) TABS tablet Take 1 tablet by mouth daily.    [provider]  OXYGEN Inhale 2 L into the lungs  continuous.    [provider]  predniSONE (DELTASONE) 10 MG tablet Take 2 tablets (20 mg total) by mouth 2 (two) times daily. 02/14/22   Veryl Speak, MD  roflumilast (DALIRESP) 500 MCG TABS tablet Take 1 tablet by mouth once daily Patient taking differently: Take 500 mcg by mouth daily. 06/11/21   Brand Males, MD  rosuvastatin (CRESTOR) 40 MG tablet Take 1 tablet (40 mg total) by mouth daily. 07/16/21   Sueanne Margarita, MD  valACYclovir (VALTREX) 1000 MG tablet Take 1 tablet (1,000 mg total) by mouth 3 (three) times daily. Patient not taking: Reported on 03/25/2022 02/14/22   Veryl Speak, MD      Allergies    Bisoprolol fumarate    Review of Systems   Review of Systems  Constitutional:  Negative for chills and fever.  Respiratory:  Negative for shortness of breath.   Cardiovascular:  Negative for chest pain and leg swelling.  Gastrointestinal:  Positive for abdominal pain. Negative for diarrhea, nausea and vomiting.  Musculoskeletal:  Positive for back pain.       Endorses left axillary pain.   Skin:  Positive for rash.       Denies erythema, swelling, or drainage.   Neurological:  Negative for dizziness, light-headedness and headaches.    Physical Exam Updated Vital Signs BP 120/60 (BP Location: Left Arm)   Pulse (!) 104   Temp 99 F (37.2 C) (Oral)   Resp 17   Wt 41.7 kg   SpO2 96%   BMI 15.79 kg/m  Physical Exam Cardiovascular:     Rate and Rhythm: Regular rhythm. Tachycardia present.     Pulses: Normal pulses.     Heart sounds: No murmur heard.    No friction rub. No gallop.  Pulmonary:     Effort: Pulmonary effort is normal.     Breath sounds: No wheezing, rhonchi or rales.  Abdominal:     General: Bowel sounds are normal. There is no distension.     Comments: Tenderness to palpation of the LLQ.  Skin:    General: Skin is warm and dry.     Findings: No rash.     Comments: Tenderness to palpation of the left lower axillary region and left flank.  Faint erythematous rash along the left lower axillary region and LLQ.   Neurological:     Mental Status: She is alert.     ED Results / Procedures / Treatments   Labs (all labs ordered are listed, but only abnormal results are displayed) Labs Reviewed - No data to display  EKG None  Radiology No results found.  Procedures Procedures    Medications Ordered in ED Medications - No data to display  ED Course/ Medical Decision Making/ A&P                           Medical Decision Making Cheryl Haley  is a 66 y.o. female with history of COPD, HTN, TIA, hypothyroidism, and shingles that presents with left-sided axillary pain that radiates to her lower abdomen and left flank.  Differential diagnosis includes but not limited to shingles, rash.  My suspicion for shingles is high given the patient's presentation as well as her physical exam.  Will prescribe short course of Percocet as well as senna for home for potential constipation. Patient discussed with Dr. Laverta Baltimore.  Risk OTC drugs. Prescription drug management.  5:18 PM Discussed with patient plan to prescribe a short dose of Percocet as well as Senna for potential constipation.  Discussed with patient plan to follow-up with her primary care doctor for her symptoms.  Discussed with patient plan to return to the emergency department she develops new or worsening symptoms including but not limited to fever, abdominal pain, drainage.  Patient agrees with the plan.        Final Clinical Impression(s) / ED Diagnoses Final diagnoses:  None    Rx / DC Orders ED Discharge Orders     None         Aracelys Glade, Claudia Desanctis, MD 03/28/22 Selinda Eon, MD 03/30/22 [phone]

## 2022-03-28 NOTE — Discharge Instructions (Signed)
You are continuing to have pain related to your shingles rash.  This appears to be resolving but sometimes the pain will linger.  I have called in a small number of pain medicines to get you through the weekend but would have you call your primary care doctor on Monday to schedule the next available follow-up appointment to discuss continuing medications or making other changes.  This may cause you to develop some constipation and have sent in some medication for that as well.  Please do not drive while taking Percocet and do not take this with other strong pain medicines or alcohol.

## 2022-03-28 NOTE — ED Triage Notes (Addendum)
Pt reports she had shingles 7 weeks ago and pain is getting worse . Pain right side. Pt started on gabapentin 3 days ago

## 2022-04-14 ENCOUNTER — Other Ambulatory Visit: Payer: Self-pay

## 2022-04-14 ENCOUNTER — Emergency Department (HOSPITAL_BASED_OUTPATIENT_CLINIC_OR_DEPARTMENT_OTHER)
Admission: EM | Admit: 2022-04-14 | Discharge: 2022-04-14 | Disposition: A | Discharge status: Home / Self Care | Payer: Medicare Other | Attending: Emergency Medicine | Admitting: Emergency Medicine

## 2022-04-14 ENCOUNTER — Other Ambulatory Visit: Payer: Self-pay | Admitting: *Deleted

## 2022-04-14 DIAGNOSIS — Z7982 Long term (current) use of aspirin: Secondary | ICD-10-CM | POA: Diagnosis not present

## 2022-04-14 DIAGNOSIS — R109 Unspecified abdominal pain: Secondary | ICD-10-CM | POA: Diagnosis not present

## 2022-04-14 DIAGNOSIS — B0222 Postherpetic trigeminal neuralgia: Secondary | ICD-10-CM | POA: Insufficient documentation

## 2022-04-14 DIAGNOSIS — B0229 Other postherpetic nervous system involvement: Secondary | ICD-10-CM

## 2022-04-14 LAB — BASIC METABOLIC PANEL
Anion gap: 8 (ref 5–15)
BUN: 19 mg/dL (ref 8–23)
CO2: 29 mmol/L (ref 22–32)
Calcium: 9 mg/dL (ref 8.9–10.3)
Chloride: 99 mmol/L (ref 98–111)
Creatinine, Ser: 0.76 mg/dL (ref 0.44–1.00)
GFR, Estimated: >60 mL/min (ref >60)
Glucose, Bld: 125 mg/dL — ABNORMAL HIGH (ref 70–99)
Potassium: 4.5 mmol/L (ref 3.5–5.1)
Sodium: 136 mmol/L (ref 135–145)

## 2022-04-14 LAB — CBC
HCT: 38 % (ref 36.0–46.0)
Hemoglobin: 12.6 g/dL (ref 12.0–15.0)
MCH: 31.1 pg (ref 26.0–34.0)
MCHC: 33.2 g/dL (ref 30.0–36.0)
MCV: 93.8 fL (ref 80.0–100.0)
Platelets: 418 10*3/uL — ABNORMAL HIGH (ref 150–400)
RBC: 4.05 MIL/uL (ref 3.87–5.11)
RDW: 17.6 % — ABNORMAL HIGH (ref 11.5–15.5)
WBC: 6.9 10*3/uL (ref 4.0–10.5)
nRBC: 0 % (ref 0.0–0.2)

## 2022-04-14 LAB — MAGNESIUM: Magnesium: 2 mg/dL (ref 1.7–2.4)

## 2022-04-14 MED ORDER — ALBUTEROL SULFATE HFA 108 (90 BASE) MCG/ACT IN AERS: 2.0000 | INHALATION_SPRAY | Freq: Four times a day (QID) | RESPIRATORY_TRACT | 6 refills | Status: DC | PRN

## 2022-04-14 MED ORDER — OXYCODONE-ACETAMINOPHEN 5-325 MG PO TABS
1.0000 | ORAL_TABLET | Freq: Once | ORAL | Status: AC
  Administered 2022-04-14: 1 via ORAL
  Filled 2022-04-14: qty 1

## 2022-04-14 MED ORDER — OXYCODONE-ACETAMINOPHEN 5-325 MG PO TABS: 1.0000 | ORAL_TABLET | Freq: Three times a day (TID) | ORAL | 0 refills | Status: DC | PRN

## 2022-04-14 MED ORDER — SODIUM CHLORIDE 0.9 % IV BOLUS
500.0000 mL | Freq: Once | INTRAVENOUS | Status: AC
  Administered 2022-04-14: 500 mL via INTRAVENOUS

## 2022-04-14 NOTE — ED Notes (Signed)
Recliner at bedside for patients family member

## 2022-04-14 NOTE — ED Triage Notes (Signed)
C/O pain on site where she recently had shingles (Left midaxillary); reports it as burning nerve pain unable to get relief from tylenol

## 2022-04-14 NOTE — ED Provider Notes (Signed)
[facility] Provider Note   CSN: [id number] Arrival date & time: 04/14/22  6073     History  Chief Complaint  Patient presents with   Pain    Cheryl Haley is a 66 y.o. female presented to ED complaining of postherpetic neuralgia.  She reports she has had pain in her left flank in a dermatomal distribution ever since she had shingles outbreak there about 2 months ago.  She is here with her family member, she is very tearful, at her wits end, reporting that none of the medications tried by her doctors that helped her at all.  This include gabapentin 600 mg 3 times daily, steroids, she did receive some minimal relief with oxycodone, but nothing else helps.  HPI     Home Medications Prior to Admission medications   Medication Sig Start Date End Date Taking? Authorizing Provider  oxyCODONE-acetaminophen (PERCOCET/ROXICET) 5-325 MG tablet Take 1 tablet by mouth every 8 (eight) hours as needed for up to 10 doses for severe pain. 04/14/22  Yes Wyvonnia Dusky, MD  albuterol (PROVENTIL) (2.5 MG/3ML) 0.083% nebulizer solution 1-2 times daily for at least 2 weeks and As needed 01/24/22   Parrett, Tammy S, NP  albuterol (VENTOLIN HFA) 108 (90 Base) MCG/ACT inhaler Inhale 2 puffs into the lungs every 6 (six) hours as needed for wheezing or shortness of breath. 04/14/22   Garner Nash, DO  alendronate (FOSAMAX) 70 MG tablet Take 70 mg by mouth every Saturday. 06/13/21   [provider]  aspirin EC 81 MG tablet Take 81 mg daily by mouth.     [provider]  Bempedoic Acid-Ezetimibe (NEXLIZET) 180-10 MG TABS Take 1 tablet by mouth daily. 09/20/21   Sueanne Margarita, MD  Budeson-Glycopyrrol-Formoterol (BREZTRI AEROSPHERE) 160-9-4.8 MCG/ACT AERO INHALE 2 PUFFS INTO LUNGS IN THE MORNING AND AT BEDTIME Patient taking differently: Inhale 2 puffs into the lungs 2 (two) times daily. 01/03/22   Brand Males, MD  feeding supplement (ENSURE ENLIVE /  ENSURE PLUS) LIQD Take 237 mLs by mouth 2 (two) times daily between meals. Patient taking differently: Take 237 mLs by mouth daily. 08/28/20   Debbe Odea, MD  HYDROcodone-acetaminophen (NORCO) 5-325 MG tablet Take 1-2 tablets by mouth every 6 (six) hours as needed. 02/14/22   Veryl Speak, MD  hydroxyurea (HYDREA) 500 MG capsule TAKE 1 CAPSULE BY MOUTH EVERY OTHER DAY TAKE WITH FOOD TO MINIMIZE GI SIDE EFFECTS Patient taking differently: Take 500 mg by mouth every other day. 09/25/21   Volanda Napoleon, MD  hydrOXYzine (ATARAX) 25 MG tablet Take 25 mg by mouth at bedtime as needed (sleep).    [provider]  levothyroxine (SYNTHROID) 75 MCG tablet Take 75 mcg by mouth every morning. 04/24/21   [provider]  metoprolol succinate (TOPROL-XL) 25 MG 24 hr tablet Take 1 tablet (25 mg total) by mouth daily. 07/16/21   Sueanne Margarita, MD  Multiple Vitamin (MULTIVITAMIN WITH MINERALS) TABS tablet Take 1 tablet by mouth daily.    [provider]  oxyCODONE-acetaminophen (PERCOCET/ROXICET) 5-325 MG tablet Take 1 tablet by mouth every 6 (six) hours as needed for severe pain. 03/28/22   Long, Wonda Olds, MD  OXYGEN Inhale 2 L into the lungs continuous.    [provider]  predniSONE (DELTASONE) 10 MG tablet Take 2 tablets (20 mg total) by mouth 2 (two) times daily. 02/14/22   Veryl Speak, MD  roflumilast (DALIRESP) 500 MCG TABS tablet Take 1 tablet  by mouth once daily Patient taking differently: Take 500 mcg by mouth daily. 06/11/21   Brand Males, MD  rosuvastatin (CRESTOR) 40 MG tablet Take 1 tablet (40 mg total) by mouth daily. 07/16/21   Sueanne Margarita, MD  senna-docusate (SENOKOT-S) 8.6-50 MG tablet Take 1 tablet by mouth at bedtime as needed for mild constipation or moderate constipation. 03/28/22   Long, Wonda Olds, MD  valACYclovir (VALTREX) 1000 MG tablet Take 1 tablet (1,000 mg total) by mouth 3 (three) times daily. Patient not taking: Reported on 03/25/2022  02/14/22   Veryl Speak, MD      Allergies    Bisoprolol fumarate    Review of Systems   Review of Systems  Physical Exam Updated Vital Signs BP (!) 124/56   Pulse 63   Temp 97.7 F (36.5 C) (Oral)   Resp (!) 26   SpO2 100%  Physical Exam Constitutional:      General: She is not in acute distress. HENT:     Head: Normocephalic and atraumatic.  Eyes:     Conjunctiva/sclera: Conjunctivae normal.     Pupils: Pupils are equal, round, and reactive to light.  Cardiovascular:     Rate and Rhythm: Normal rate and regular rhythm.  Pulmonary:     Effort: Pulmonary effort is normal. No respiratory distress.  Abdominal:     General: There is no distension.     Tenderness: There is no abdominal tenderness.  Skin:    General: Skin is warm and dry.  Neurological:     General: No focal deficit present.     Mental Status: She is alert. Mental status is at baseline.  Psychiatric:        Mood and Affect: Mood normal.        Behavior: Behavior normal.     ED Results / Procedures / Treatments   Labs (all labs ordered are listed, but only abnormal results are displayed) Labs Reviewed  CBC - Abnormal; Notable for the following components:      Result Value   RDW 17.6 (*)    Platelets 418 (*)    All other components within normal limits  BASIC METABOLIC PANEL  MAGNESIUM    EKG None  Radiology No results found.  Procedures Procedures    Medications Ordered in ED Medications  sodium chloride 0.9 % bolus 500 mL (has no administration in time range)  oxyCODONE-acetaminophen (PERCOCET/ROXICET) 5-325 MG per tablet 1 tablet (1 tablet Oral Given 04/14/22 1504)    ED Course/ Medical Decision Making/ A&P                           Medical Decision Making Amount and/or Complexity of Data Reviewed Labs: ordered.  Risk Prescription drug management.   Patient here with pain in a dermatomal distribution on the left flank of her abdomen, consistent with postherpetic  neuralgia.  She has no active lesions at this time.  No evidence of disseminated zoster at this time.  She may need follow-up with your neurologist or pain management clinic, I am not certain whether options may be available for pain control.  It is an unfortunate situation.  I did offer her a short-term prescription for Percocet, 10 tablets, but explained the ER will not be able to further help with chronic ongoing pain.  This is the only thing that helps her sleep.  I do understand her level of frustration.  She is also on Lyrica through her  PCP.  This may take some more time to build up in her system.  Supplemental history is provided by her family member at bedside.  With the patient for members to concerns that she may be getting dehydrated, reporting that she has had poor appetite and not drinking much due to her pain.  They are requesting that we check blood work to see her hydration status.  Denies diarrhea issues.  BMP, Mg, and CBC will be checked - patient signed out to Dr Tamera Punt EDP pending f/u on labs -can be discharged if no emergent findings.  Prescription sent to her pharmacy [facility] for pain control.  Neurology office information provided.  Patient and her daughter verbalized understanding.        Final Clinical Impression(s) / ED Diagnoses Final diagnoses:  Post herpetic neuralgia    Rx / DC Orders ED Discharge Orders          Ordered    oxyCODONE-acetaminophen (PERCOCET/ROXICET) 5-325 MG tablet  Every 8 hours PRN        04/14/22 1453              Wyvonnia Dusky, MD 04/14/22 1520

## 2022-04-20 ENCOUNTER — Other Ambulatory Visit: Payer: Self-pay | Admitting: Internal Medicine

## 2022-05-01 ENCOUNTER — Other Ambulatory Visit: Payer: Self-pay | Admitting: *Deleted

## 2022-05-01 ENCOUNTER — Ambulatory Visit: Payer: Medicare Other | Admitting: Pulmonary Disease

## 2022-05-02 ENCOUNTER — Other Ambulatory Visit: Payer: Self-pay | Admitting: Internal Medicine

## 2022-05-14 ENCOUNTER — Other Ambulatory Visit: Payer: Self-pay | Admitting: Pulmonary Disease

## 2022-05-14 DIAGNOSIS — R918 Other nonspecific abnormal finding of lung field: Secondary | ICD-10-CM

## 2022-05-14 NOTE — Progress Notes (Unsigned)
PCCM:   Please schedule PET scan prior to office appointment in October.  [facility] 05/14/2022 1:38 PM

## 2022-05-14 NOTE — Progress Notes (Signed)
Patty Sermons, I would like for Angelia to have a PET scan prior to seeing me in the office on 06/05/2022. I have placed orders in epic.  Thanks,  BLI  Garner Nash, DO [facility] 05/14/2022 1:36 PM

## 2022-05-26 ENCOUNTER — Encounter: Payer: Self-pay | Admitting: Family

## 2022-05-26 ENCOUNTER — Other Ambulatory Visit: Payer: Self-pay

## 2022-05-26 ENCOUNTER — Inpatient Hospital Stay (HOSPITAL_BASED_OUTPATIENT_CLINIC_OR_DEPARTMENT_OTHER): Payer: Medicare Other | Admitting: Family

## 2022-05-26 ENCOUNTER — Inpatient Hospital Stay: Payer: Medicare Other | Attending: Hematology & Oncology

## 2022-05-26 VITALS — BP 123/52 | HR 95 | Temp 98.2°F | Resp 19 | Ht 64.0 in | Wt 90.8 lb

## 2022-05-26 DIAGNOSIS — D509 Iron deficiency anemia, unspecified: Secondary | ICD-10-CM | POA: Diagnosis not present

## 2022-05-26 DIAGNOSIS — Z7982 Long term (current) use of aspirin: Secondary | ICD-10-CM | POA: Diagnosis not present

## 2022-05-26 DIAGNOSIS — D473 Essential (hemorrhagic) thrombocythemia: Secondary | ICD-10-CM

## 2022-05-26 LAB — CBC WITH DIFFERENTIAL (CANCER CENTER ONLY)
Abs Immature Granulocytes: 0.04 10*3/uL (ref 0.00–0.07)
Basophils Absolute: 0 10*3/uL (ref 0.0–0.1)
Basophils Relative: 1 %
Eosinophils Absolute: 0.1 10*3/uL (ref 0.0–0.5)
Eosinophils Relative: 1 %
HCT: 38.1 % (ref 36.0–46.0)
Hemoglobin: 12.2 g/dL (ref 12.0–15.0)
Immature Granulocytes: 1 %
Lymphocytes Relative: 13 %
Lymphs Abs: 0.8 10*3/uL (ref 0.7–4.0)
MCH: 30.7 pg (ref 26.0–34.0)
MCHC: 32 g/dL (ref 30.0–36.0)
MCV: 95.7 fL (ref 80.0–100.0)
Monocytes Absolute: 0.4 10*3/uL (ref 0.1–1.0)
Monocytes Relative: 7 %
Neutro Abs: 5.1 10*3/uL (ref 1.7–7.7)
Neutrophils Relative %: 77 %
Platelet Count: 374 10*3/uL (ref 150–400)
RBC: 3.98 MIL/uL (ref 3.87–5.11)
RDW: 16.4 % — ABNORMAL HIGH (ref 11.5–15.5)
WBC Count: 6.4 10*3/uL (ref 4.0–10.5)
nRBC: 0 % (ref 0.0–0.2)

## 2022-05-26 LAB — CMP (CANCER CENTER ONLY)
ALT: 27 U/L (ref 0–44)
AST: 34 U/L (ref 15–41)
Albumin: 4.3 g/dL (ref 3.5–5.0)
Alkaline Phosphatase: 50 U/L (ref 38–126)
Anion gap: 8 (ref 5–15)
BUN: 19 mg/dL (ref 8–23)
CO2: 31 mmol/L (ref 22–32)
Calcium: 10.5 mg/dL — ABNORMAL HIGH (ref 8.9–10.3)
Chloride: 97 mmol/L — ABNORMAL LOW (ref 98–111)
Creatinine: 0.84 mg/dL (ref 0.44–1.00)
GFR, Estimated: >60 mL/min (ref >60)
Glucose, Bld: 112 mg/dL — ABNORMAL HIGH (ref 70–99)
Potassium: 4.9 mmol/L (ref 3.5–5.1)
Sodium: 136 mmol/L (ref 135–145)
Total Bilirubin: 0.4 mg/dL (ref 0.3–1.2)
Total Protein: 7.8 g/dL (ref 6.5–8.1)

## 2022-05-26 LAB — LACTATE DEHYDROGENASE: LDH: 166 U/L (ref 98–192)

## 2022-05-26 LAB — FERRITIN: Ferritin: 90 ng/mL (ref 11–307)

## 2022-05-26 NOTE — Progress Notes (Signed)
Hematology and Oncology Follow Up Visit  Cheryl Haley [id number] 05-Sep-1955 66 y.o. 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 She is doing well. She still has some post herpetic neuralgia pain in her left lower abdomen. She was able to go to the pain clinic and had improvement in her symptoms with steroid injections.  SOB stable with supplemental O2.  No fever, chills, n/v, cough, rash, dizziness, chest pain, palpitations, abdominal pain or changes in bowel or bladder habits.  She has not noted any blood loss. No bruising or petechiae.  Hgb 12.2, MCV 95, platelets 374 and WBC count 6.4.  No swelling, numbness or tingling in her extremities.  No falls or syncope reported. She is ambulating with a Rolator for added support.  Appetite and hydration have been good. Her weight is stable at 90 lbs.   ECOG Performance Status: 1 - Symptomatic but completely ambulatory  Medications:  Allergies as of 05/26/2022       Reactions   Bisoprolol Fumarate    Other reaction(s): heart issues        Medication List        Accurate as of May 26, 2022  2:35 PM. If you have any questions, ask your nurse or doctor.          albuterol (2.5 MG/3ML) 0.083% nebulizer solution Commonly known as: PROVENTIL 1-2 times daily for at least 2 weeks and As needed   albuterol 108 (90 Base) MCG/ACT inhaler Commonly known as: VENTOLIN HFA Inhale 2 puffs into the lungs every 6 (six) hours as needed for wheezing or shortness of breath.   alendronate 70 MG tablet Commonly known as: FOSAMAX Take 70 mg by mouth every Saturday.   aspirin EC 81 MG tablet Take 81 mg daily by mouth.   Breztri Aerosphere 160-9-4.8 MCG/ACT Aero Generic drug:  Budeson-Glycopyrrol-Formoterol INHALE 2 PUFFS INTO LUNGS IN THE MORNING AND AT BEDTIME What changed: See the new instructions.   Daliresp 500 MCG Tabs tablet Generic drug: roflumilast Take 1 tablet by mouth once daily   feeding supplement Liqd Take 237 mLs by mouth 2 (two) times daily between meals. What changed: when to take this   HYDROcodone-acetaminophen 5-325 MG tablet Commonly known as: Norco Take 1-2 tablets by mouth every 6 (six) hours as needed.   hydroxyurea 500 MG capsule Commonly known as: HYDREA TAKE 1 CAPSULE BY MOUTH EVERY OTHER DAY TAKE WITH FOOD TO MINIMIZE GI SIDE EFFECTS What changed:  how much to take how to take this when to take this additional instructions   hydrOXYzine 25 MG tablet Commonly known as: ATARAX Take 25 mg by mouth at bedtime as needed (sleep).   levothyroxine 75 MCG tablet Commonly known as: SYNTHROID Take 75 mcg by mouth every morning.   metoprolol succinate 25 MG 24 hr tablet Commonly known as: TOPROL-XL Take 1 tablet (25 mg total) by mouth daily.   multivitamin with minerals Tabs tablet Take 1 tablet by mouth daily.   Nexlizet 180-10 MG Tabs Generic drug: Bempedoic Acid-Ezetimibe Take 1 tablet by mouth daily.   oxyCODONE-acetaminophen 5-325 MG tablet Commonly known as: PERCOCET/ROXICET Take 1 tablet by mouth every 6 (six) hours as needed for severe pain.   oxyCODONE-acetaminophen 5-325 MG tablet Commonly known as: PERCOCET/ROXICET Take 1 tablet by  mouth every 8 (eight) hours as needed for up to 10 doses for severe pain.   OXYGEN Inhale 2 L into the lungs continuous.   predniSONE 10 MG tablet Commonly known as: DELTASONE Take 2 tablets (20 mg total) by mouth 2 (two) times daily.   predniSONE 5 MG tablet Commonly known as: DELTASONE TAKE 1 TABLET BY MOUTH ONCE DAILY ONCE DAILY WITH BREAKFAST   rosuvastatin 40 MG tablet Commonly known as: CRESTOR Take 1 tablet (40 mg total) by mouth daily.   senna-docusate 8.6-50  MG tablet Commonly known as: Senokot-S Take 1 tablet by mouth at bedtime as needed for mild constipation or moderate constipation.   valACYclovir 1000 MG tablet Commonly known as: VALTREX Take 1 tablet (1,000 mg total) by mouth 3 (three) times daily.        Allergies:  Allergies  Allergen Reactions   Bisoprolol Fumarate     Other reaction(s): heart issues    Past Medical History, Surgical history, Social history, and Family History were reviewed and updated.  Review of Systems: All other 10 point review of systems is negative.   Physical Exam:  vitals were not taken for this visit.   Wt Readings from Last 3 Encounters:  03/28/22 92 lb (41.7 kg)  03/25/22 91 lb 1.3 oz (41.3 kg)  02/14/22 95 lb (43.1 kg)    Ocular: Sclerae unicteric, pupils equal, round and reactive to light Ear-nose-throat: Oropharynx clear, dentition fair Lymphatic: No cervical or supraclavicular adenopathy Lungs no rales or rhonchi, good excursion bilaterally Heart regular rate and rhythm, no murmur appreciated Abd soft, nontender, positive bowel sounds MSK no focal spinal tenderness, no joint edema Neuro: non-focal, well-oriented, appropriate affect Breasts: Deferred   Lab Results  Component Value Date   WBC 6.4 05/26/2022   HGB 12.2 05/26/2022   HCT 38.1 05/26/2022   MCV 95.7 05/26/2022   PLT 374 05/26/2022   Lab Results  Component Value Date   FERRITIN 120 03/25/2022   IRON 54 03/25/2022   TIBC 326 03/25/2022   UIBC 272 03/25/2022   IRONPCTSAT 17 03/25/2022   Lab Results  Component Value Date   RBC 3.98 05/26/2022   No results found for: "KPAFRELGTCHN", "LAMBDASER", "KAPLAMBRATIO" No results found for: "IGGSERUM", "IGA", "IGMSERUM" Lab Results  Component Value Date   TOTALPROTELP 6.8 09/23/2018   ALBUMINELP 3.9 09/23/2018   A1GS 0.1 09/23/2018   A2GS 0.9 09/23/2018   BETS 0.8 09/23/2018   GAMS 1.0 09/23/2018   MSPIKE Not Observed 09/23/2018   SPEI Comment 09/23/2018      Chemistry      Component Value Date/Time   NA 136 04/14/2022 1502   K 4.5 04/14/2022 1502   CL 99 04/14/2022 1502   CO2 29 04/14/2022 1502   BUN 19 04/14/2022 1502   CREATININE 0.76 04/14/2022 1502   CREATININE 0.84 03/25/2022 0929      Component Value Date/Time   CALCIUM 9.0 04/14/2022 1502   ALKPHOS 51 03/25/2022 0929   AST 23 03/25/2022 0929   ALT 18 03/25/2022 0929   BILITOT 0.4 03/25/2022 0929       Impression and Plan: Ms. Massar is a very pleasant 66 yo female with essential thrombocythemia, JAK2 V617F+; high risk by IPSET.  She is doing well and her counts are stable at this time.  She will continue taking her Hydrea every other day.  Iron studies are pending.  Follow-up in 4 months.   Lottie Dawson, NP 9/25/20232:35 PM

## 2022-05-27 LAB — IRON AND IRON BINDING CAPACITY (CC-WL,HP ONLY)
Iron: 48 ug/dL (ref 28–170)
Saturation Ratios: 12 % (ref 10.4–31.8)
TIBC: 398 ug/dL (ref 250–450)
UIBC: 350 ug/dL (ref 148–442)

## 2022-06-02 ENCOUNTER — Ambulatory Visit (HOSPITAL_COMMUNITY)
Admission: RE | Admit: 2022-06-02 | Discharge: 2022-06-02 | Disposition: A | Discharge status: Home / Self Care | Payer: Medicare Other | Source: Ambulatory Visit | Attending: Pulmonary Disease | Admitting: Pulmonary Disease

## 2022-06-02 DIAGNOSIS — R918 Other nonspecific abnormal finding of lung field: Secondary | ICD-10-CM | POA: Diagnosis present

## 2022-06-02 LAB — GLUCOSE, CAPILLARY: Glucose-Capillary: 101 mg/dL — ABNORMAL HIGH (ref 70–99)

## 2022-06-02 MED ORDER — FLUDEOXYGLUCOSE F - 18 (FDG) INJECTION
4.5000 | Freq: Once | INTRAVENOUS | Status: AC | PRN
  Administered 2022-06-02: 4.99 via INTRAVENOUS

## 2022-06-05 ENCOUNTER — Encounter: Payer: Self-pay | Admitting: Pulmonary Disease

## 2022-06-05 ENCOUNTER — Ambulatory Visit (INDEPENDENT_AMBULATORY_CARE_PROVIDER_SITE_OTHER): Payer: Medicare Other | Admitting: Pulmonary Disease

## 2022-06-05 VITALS — BP 140/80 | HR 97 | Ht 63.0 in | Wt 91.4 lb

## 2022-06-05 DIAGNOSIS — J449 Chronic obstructive pulmonary disease, unspecified: Secondary | ICD-10-CM

## 2022-06-05 DIAGNOSIS — E43 Unspecified severe protein-calorie malnutrition: Secondary | ICD-10-CM

## 2022-06-05 DIAGNOSIS — Z23 Encounter for immunization: Secondary | ICD-10-CM | POA: Diagnosis not present

## 2022-06-05 DIAGNOSIS — J984 Other disorders of lung: Secondary | ICD-10-CM | POA: Diagnosis not present

## 2022-06-05 DIAGNOSIS — J432 Centrilobular emphysema: Secondary | ICD-10-CM

## 2022-06-05 DIAGNOSIS — R918 Other nonspecific abnormal finding of lung field: Secondary | ICD-10-CM

## 2022-06-05 DIAGNOSIS — J9611 Chronic respiratory failure with hypoxia: Secondary | ICD-10-CM

## 2022-06-05 NOTE — Progress Notes (Signed)
Synopsis: Referred in November 2022 for lung nodule by [facility]  Subjective:   PATIENT ID: Cheryl Haley GENDER: female DOB: 1956-09-01, MRN: [id number]  Chief Complaint  Patient presents with   Follow-up    This is a 66 year old female, past medical history of significant emphysema, hypertension, hyperlipidemia, former tobacco abuse, quit in 2016.  In 2018 she had a left upper lobe PET avid nodule that was taken for navigational bronchoscopy by Dr. Lamonte Sakai.  This was nondiagnostic and subsequent CT imaging revealed resolution of this left upper lobe nodule.  She is been followed now by some time from Dr. Chase Caller in his clinic.  Ultimately patient had additional CT imaging done which showed a new left lung nodule.  Also has left upper lobe posterior nodules that look more inflammatory.  Patient ultimately had a nuclear medicine PET scan done which showed low-level uptake within both of these lesions.  Patient is very anxious about this due to the family history of malignancy and lung cancer.  Today in the office we reviewed her CT images and discuss plans.  OV 08/29/2021: Here today for follow-up after recent bronchoscopy.  Bronchoscopy results are negative for malignancy.  Between her PET scan and the super D the nodule has progressed some suggestive of an infectious etiology.  Cultures so far no growth to date.  She was treated with antibiotics and she completes her antibiotic course today.  From a respiratory standpoint she is doing well using her Breztri plus Daliresp and as needed albuterol.  Follows with Dr. Chase Caller in pulmonary clinic.  For her advanced COPD and chronic hypoxemic respiratory failure.  OV 06/05/2022: Here today for follow up regarding lung nodules. She had a repeat pet scan complete. patient has no respiratory complaints.  Using [place] daily as well as as needed albuterol.  She still has a partially cavitary lesion in the right lung with areas of  consolidation that appears to be inflammatory it is hypermetabolic on PET scan.  We reviewed this today.    Past Medical History:  Diagnosis Date   Agatston coronary artery calcium score greater than 400    score 678   Anxiety    Arthritis    "in neck"   COPD (chronic obstructive pulmonary disease) (HCC)    Dyspnea    Emphysema lung (HCC)    Essential thrombocythemia ([place]) 09/15/2019   History of hiatal hernia    Hx of TIA (transient ischemic attack) and stroke    Hyperlipidemia    Hypertension    Hypothyroidism    Impaired fasting glucose    Pleural effusion 05/15/2014   CXR 05/2014:  New small right effusion.     Pneumonia    Protein-calorie malnutrition, severe 09/17/2018   Pulmonary nodules 07/14/2018   07/2017 CT chest >pulmonary nodules noted.  07/2017 PET scan -Hypermetabolic LUL nodule  04/997 CT chest >decreased nodule size, new pulmonary nodules noted.  FOB -atypical cells  CT chest 07/2018 >stable nodules to smaller on established nodules . 2 new nodules RUL , RLL >CT chest 3 months    Thyroid disease    Tobacco abuse 11/28/2015   Smoked 2 packs of cigarettes daily from teenage years until age 61, quit in 2016    Unspecified hypothyroidism 09/20/2013   Viral respiratory infection 09/16/2018     Family History  Problem Relation Age of Onset   Emphysema Father    Heart disease Father    Emphysema Brother    Emphysema Sister  Emphysema Sister    Heart disease Mother    Heart disease Sister    Heart disease Brother    Heart disease Brother    Congestive Heart Failure Sister    Congestive Heart Failure Sister    Cancer Sister        lung   Cancer Sister        breast     Past Surgical History:  Procedure Laterality Date   BRONCHIAL BIOPSY  08/20/2021   Procedure: BRONCHIAL BIOPSIES;  Surgeon: Garner Nash, DO;  Location: [facility];  Service: Pulmonary;;   BRONCHIAL BRUSHINGS  08/20/2021   Procedure: BRONCHIAL BRUSHINGS;  Surgeon: Garner Nash, DO;  Location: [facility];  Service: Pulmonary;;   BRONCHIAL NEEDLE ASPIRATION BIOPSY  08/20/2021   Procedure: BRONCHIAL NEEDLE ASPIRATION BIOPSIES;  Surgeon: Garner Nash, DO;  Location: [facility];  Service: Pulmonary;;   BRONCHIAL WASHINGS  08/20/2021   Procedure: BRONCHIAL WASHINGS;  Surgeon: Garner Nash, DO;  Location: [facility];  Service: Pulmonary;;   ESOPHAGOGASTRODUODENOSCOPY     EYE SURGERY     FIDUCIAL MARKER PLACEMENT  08/20/2021   Procedure: FIDUCIAL MARKER PLACEMENT;  Surgeon: Garner Nash, DO;  Location: [facility];  Service: Pulmonary;;   VIDEO BRONCHOSCOPY WITH ENDOBRONCHIAL NAVIGATION Left 07/29/2017   Procedure: VIDEO BRONCHOSCOPY WITH ENDOBRONCHIAL NAVIGATION;  Surgeon: Collene Gobble, MD;  Location: [facility];  Service: Thoracic;  Laterality: Left;   VIDEO BRONCHOSCOPY WITH RADIAL ENDOBRONCHIAL ULTRASOUND  08/20/2021   Procedure: RADIAL ENDOBRONCHIAL ULTRASOUND;  Surgeon: Garner Nash, DO;  Location: [facility];  Service: Pulmonary;;    Social History   Socioeconomic History   Marital status: Divorced    Spouse name: Not on file   Number of children: Not on file   Years of education: Not on file   Highest education level: Not on file  Occupational History   Not on file  Tobacco Use   Smoking status: Former    Packs/day: 2.00    Years: 40.00    Total pack years: 80.00    Types: Cigarettes    Quit date: 03/16/2017    Years since quitting: 5.2   Smokeless tobacco: Never   Tobacco comments:       Vaping Use   Vaping Use: Never used  Substance and Sexual Activity   Alcohol use: Not Currently   Drug use: No   Sexual activity: Not on file  Other Topics Concern   Not on file  Social History Narrative   Not on file   Social Determinants of Health   Financial Resource Strain: Not on file  Food Insecurity: Not on file  Transportation Needs: Not on file  Physical Activity: Not on file  Stress: Not on file  Social Connections:  Not on file  Intimate Partner Violence: Not on file     Allergies  Allergen Reactions   Bisoprolol Fumarate Other (See Comments)    Other reaction(s): heart issues     Outpatient Medications Prior to Visit  Medication Sig Dispense Refill   albuterol (VENTOLIN HFA) 108 (90 Base) MCG/ACT inhaler Inhale 2 puffs into the lungs every 6 (six) hours as needed for wheezing or shortness of breath. 18 g 6   Budeson-Glycopyrrol-Formoterol (BREZTRI AEROSPHERE) 160-9-4.8 MCG/ACT AERO INHALE 2 PUFFS INTO LUNGS IN THE MORNING AND AT BEDTIME (Patient taking differently: Inhale 2 puffs into the lungs 2 (two) times daily.) 11 g 5   predniSONE (DELTASONE) 5 MG tablet TAKE 1 TABLET  BY MOUTH ONCE DAILY ONCE DAILY WITH BREAKFAST 30 tablet 2   alendronate (FOSAMAX) 70 MG tablet Take 70 mg by mouth every Saturday.     amitriptyline (ELAVIL) 50 MG tablet Take 50 mg by mouth at bedtime.     aspirin EC 81 MG tablet Take 81 mg daily by mouth.      Bempedoic Acid-Ezetimibe (NEXLIZET) 180-10 MG TABS Take 1 tablet by mouth daily. 90 tablet 3   DALIRESP 500 MCG TABS tablet Take 1 tablet by mouth once daily 30 tablet 5   feeding supplement (ENSURE ENLIVE / ENSURE PLUS) LIQD Take 237 mLs by mouth 2 (two) times daily between meals. (Patient taking differently: Take 237 mLs by mouth daily.) 237 mL 12   gabapentin (NEURONTIN) 300 MG capsule Take 300 mg by mouth 2 (two) times daily.     hydroxyurea (HYDREA) 500 MG capsule TAKE 1 CAPSULE BY MOUTH EVERY OTHER DAY TAKE WITH FOOD TO MINIMIZE GI SIDE EFFECTS (Patient taking differently: Take 500 mg by mouth every other day.) 45 capsule 6   hydrOXYzine (ATARAX) 25 MG tablet Take 25 mg by mouth at bedtime as needed (sleep).     levothyroxine (SYNTHROID) 75 MCG tablet Take 75 mcg by mouth every morning.     metoprolol succinate (TOPROL-XL) 25 MG 24 hr tablet Take 1 tablet (25 mg total) by mouth daily. 90 tablet 3   Multiple Vitamin (MULTIVITAMIN WITH MINERALS) TABS tablet Take 1 tablet  by mouth daily.     OXYGEN Inhale 2 L into the lungs continuous.     rosuvastatin (CRESTOR) 40 MG tablet Take 1 tablet (40 mg total) by mouth daily. 90 tablet 3   No facility-administered medications prior to visit.    Review of Systems  Constitutional:  Negative for chills, fever, malaise/fatigue and weight loss.  HENT:  Negative for hearing loss, sore throat and tinnitus.   Eyes:  Negative for blurred vision and double vision.  Respiratory:  Positive for shortness of breath. Negative for cough, hemoptysis, sputum production, wheezing and stridor.   Cardiovascular:  Negative for chest pain, palpitations, orthopnea, leg swelling and PND.  Gastrointestinal:  Negative for abdominal pain, constipation, diarrhea, heartburn, nausea and vomiting.  Genitourinary:  Negative for dysuria, hematuria and urgency.  Musculoskeletal:  Negative for joint pain and myalgias.  Skin:  Negative for itching and rash.  Neurological:  Negative for dizziness, tingling, weakness and headaches.  Endo/Heme/Allergies:  Negative for environmental allergies. Does not bruise/bleed easily.  Psychiatric/Behavioral:  Negative for depression. The patient is not nervous/anxious and does not have insomnia.   All other systems reviewed and are negative.    Objective:  Physical Exam Vitals reviewed.  Constitutional:      General: She is not in acute distress.    Appearance: She is well-developed.  HENT:     Head: Normocephalic and atraumatic.     Mouth/Throat:     Pharynx: No oropharyngeal exudate.  Eyes:     Conjunctiva/sclera: Conjunctivae normal.     Pupils: Pupils are equal, round, and reactive to light.  Neck:     Vascular: No JVD.     Trachea: No tracheal deviation.     Comments: Loss of supraclavicular fat Cardiovascular:     Rate and Rhythm: Normal rate and regular rhythm.     Heart sounds: S1 normal and S2 normal.     Comments: Distant heart tones Pulmonary:     Effort: No tachypnea or accessory  muscle usage.  Breath sounds: No stridor. Decreased breath sounds (throughout all lung fields) present. No wheezing, rhonchi or rales.     Comments: Severely diminished breath sounds bilaterally Abdominal:     General: Bowel sounds are normal. There is no distension.     Palpations: Abdomen is soft.     Tenderness: There is no abdominal tenderness.  Musculoskeletal:        General: Deformity (muscle wasting ) present.  Skin:    General: Skin is warm and dry.     Capillary Refill: Capillary refill takes less than 2 seconds.     Findings: No rash.  Neurological:     Mental Status: She is alert and oriented to person, place, and time.  Psychiatric:        Behavior: Behavior normal.      Vitals:   06/05/22 0933  BP: (!) 140/80  Pulse: 97  SpO2: 95%  Weight: 91 lb 6.4 oz (41.5 kg)  Height: '5\' 3"'$  (1.6 m)   95% on RA BMI Readings from Last 3 Encounters:  06/05/22 16.19 kg/m  05/26/22 15.59 kg/m  03/28/22 15.79 kg/m   Wt Readings from Last 3 Encounters:  06/05/22 91 lb 6.4 oz (41.5 kg)  05/26/22 90 lb 12.8 oz (41.2 kg)  03/28/22 92 lb (41.7 kg)     CBC    Component Value Date/Time   WBC 6.4 05/26/2022 1400   WBC 6.9 04/14/2022 1502   RBC 3.98 05/26/2022 1400   HGB 12.2 05/26/2022 1400   HCT 38.1 05/26/2022 1400   PLT 374 05/26/2022 1400   MCV 95.7 05/26/2022 1400   MCH 30.7 05/26/2022 1400   MCHC 32.0 05/26/2022 1400   RDW 16.4 (H) 05/26/2022 1400   LYMPHSABS 0.8 05/26/2022 1400   MONOABS 0.4 05/26/2022 1400   EOSABS 0.1 05/26/2022 1400   BASOSABS 0.0 05/26/2022 1400    Chest Imaging: June 24, 2021: CT chest: Upper lobe pulmonary nodule on the left side, posterior lesions to the look more inflammatory in nature. This is new nodule however compared to her imaging that was done last year. The patient's images have been independently reviewed by me.    08/12/2021 super D CT chest: Interval progression of the consolidation within the right upper lobe  posterior segment.  The adjacent right upper lobe lesion in the lateral aspect was still persistent. The patient's images have been independently reviewed by me.    06/04/2022 nuclear medicine pet imaging: Partially cavitary consolidation.  Hypermetabolic on PET scan. Due to evolution and change over time more likely inflammatory instead of malignant. The patient's images have been independently reviewed by me.    Pulmonary Functions Testing Results:    Latest Ref Rng & Units 07/19/2021   10:13 AM  PFT Results  FVC-Pre L 1.48   FVC-Predicted Pre % 47   Pre FEV1/FVC % % 36   FEV1-Pre L 0.54   FEV1-Predicted Pre % 22   DLCO uncorrected ml/min/mmHg 5.56   DLCO UNC% % 28   DLCO corrected ml/min/mmHg 5.56   DLCO COR %Predicted % 28   DLVA Predicted % 47     FeNO:   Pathology:   Echocardiogram:   Heart Catheterization:     Assessment & Plan:     ICD-10-CM   1. Pulmonary nodules  R91.8 CT Chest Wo Contrast    2. Cavitary lesion of lung  J98.4     3. Centrilobular emphysema ([place])  J43.2     4. Stage 3 severe COPD by  GOLD classification ([place])  J44.9     5. Protein-calorie malnutrition, severe  E43     6. Chronic respiratory failure with hypoxia (HCC)  J96.11       Discussion:  This is a 66 year old female, history of pulmonary nodules, history of family history of lung cancer, centrilobular emphysema, chronic hypoxemic respiratory failure, had bronchoscopy in December 2022 which was negative for malignancy.  Her infiltrate continue to cavitate and change over time feeling to be related to infectious or inflammatory disease.  Cultures no growth to date.  Plan: Repeat follow-up pet imaging still shows hypermetabolic disease within the cavitary consolidation. We will plan for repeat noncontrast CT scan of the chest in 6 months. Continue albuterol as needed Continue Breztri Continue Daliresp Repeat noncontrast CT in 6 months and follow-up with [place] in clinic after  that.    Current Outpatient Medications:    albuterol (VENTOLIN HFA) 108 (90 Base) MCG/ACT inhaler, Inhale 2 puffs into the lungs every 6 (six) hours as needed for wheezing or shortness of breath., Disp: 18 g, Rfl: 6   Budeson-Glycopyrrol-Formoterol (BREZTRI AEROSPHERE) 160-9-4.8 MCG/ACT AERO, INHALE 2 PUFFS INTO LUNGS IN THE MORNING AND AT BEDTIME (Patient taking differently: Inhale 2 puffs into the lungs 2 (two) times daily.), Disp: 11 g, Rfl: 5   predniSONE (DELTASONE) 5 MG tablet, TAKE 1 TABLET BY MOUTH ONCE DAILY ONCE DAILY WITH BREAKFAST, Disp: 30 tablet, Rfl: 2   alendronate (FOSAMAX) 70 MG tablet, Take 70 mg by mouth every Saturday., Disp: , Rfl:    amitriptyline (ELAVIL) 50 MG tablet, Take 50 mg by mouth at bedtime., Disp: , Rfl:    aspirin EC 81 MG tablet, Take 81 mg daily by mouth. , Disp: , Rfl:    Bempedoic Acid-Ezetimibe (NEXLIZET) 180-10 MG TABS, Take 1 tablet by mouth daily., Disp: 90 tablet, Rfl: 3   DALIRESP 500 MCG TABS tablet, Take 1 tablet by mouth once daily, Disp: 30 tablet, Rfl: 5   feeding supplement (ENSURE ENLIVE / ENSURE PLUS) LIQD, Take 237 mLs by mouth 2 (two) times daily between meals. (Patient taking differently: Take 237 mLs by mouth daily.), Disp: 237 mL, Rfl: 12   gabapentin (NEURONTIN) 300 MG capsule, Take 300 mg by mouth 2 (two) times daily., Disp: , Rfl:    hydroxyurea (HYDREA) 500 MG capsule, TAKE 1 CAPSULE BY MOUTH EVERY OTHER DAY TAKE WITH FOOD TO MINIMIZE GI SIDE EFFECTS (Patient taking differently: Take 500 mg by mouth every other day.), Disp: 45 capsule, Rfl: 6   hydrOXYzine (ATARAX) 25 MG tablet, Take 25 mg by mouth at bedtime as needed (sleep)., Disp: , Rfl:    levothyroxine (SYNTHROID) 75 MCG tablet, Take 75 mcg by mouth every morning., Disp: , Rfl:    metoprolol succinate (TOPROL-XL) 25 MG 24 hr tablet, Take 1 tablet (25 mg total) by mouth daily., Disp: 90 tablet, Rfl: 3   Multiple Vitamin (MULTIVITAMIN WITH MINERALS) TABS tablet, Take 1 tablet by  mouth daily., Disp: , Rfl:    OXYGEN, Inhale 2 L into the lungs continuous., Disp: , Rfl:    rosuvastatin (CRESTOR) 40 MG tablet, Take 1 tablet (40 mg total) by mouth daily., Disp: 90 tablet, Rfl: 3   Garner Nash, DO [facility] 06/05/2022 9:48 AM

## 2022-06-05 NOTE — Patient Instructions (Addendum)
Thank you for visiting Dr. Valeta Harms at [facility]. Today we recommend the following: Orders Placed This Encounter  Procedures   CT Chest Wo Contrast   Continue breztri  Continue albuterol as needed   See [place] after ct chest in 6 months   Return in about 6 months (around 12/05/2022) for with Eric Form, NP, or Dr. Valeta Harms.    Please do your part to reduce the spread of COVID-19.

## 2022-06-12 ENCOUNTER — Ambulatory Visit: Payer: Medicare Other | Admitting: Adult Health

## 2022-06-22 ENCOUNTER — Other Ambulatory Visit: Payer: Self-pay | Admitting: Internal Medicine

## 2022-07-11 ENCOUNTER — Telehealth: Payer: Self-pay

## 2022-07-11 ENCOUNTER — Ambulatory Visit: Payer: Medicare Other | Attending: Cardiology | Admitting: Cardiology

## 2022-07-11 ENCOUNTER — Encounter: Payer: Self-pay | Admitting: Cardiology

## 2022-07-11 VITALS — BP 142/80 | HR 90 | Ht 63.0 in | Wt 93.0 lb

## 2022-07-11 DIAGNOSIS — I10 Essential (primary) hypertension: Secondary | ICD-10-CM | POA: Diagnosis not present

## 2022-07-11 DIAGNOSIS — E7800 Pure hypercholesterolemia, unspecified: Secondary | ICD-10-CM | POA: Diagnosis not present

## 2022-07-11 DIAGNOSIS — R002 Palpitations: Secondary | ICD-10-CM | POA: Insufficient documentation

## 2022-07-11 DIAGNOSIS — R931 Abnormal findings on diagnostic imaging of heart and coronary circulation: Secondary | ICD-10-CM

## 2022-07-11 DIAGNOSIS — I700 Atherosclerosis of aorta: Secondary | ICD-10-CM | POA: Insufficient documentation

## 2022-07-11 NOTE — Addendum Note (Signed)
Addended by: Bernestine Amass on: 07/11/2022 08:48 AM   Modules accepted: Orders

## 2022-07-11 NOTE — Telephone Encounter (Addendum)
Lab results received from patients PCP showing elevated LDL at 84.  Per Dr. Radford Pax, patient needs referral to lipid clinic. Referral has been placed and patient is aware.

## 2022-07-11 NOTE — Progress Notes (Addendum)
Cardiology Office  Note    Date:  07/11/2022   ID:  Cheryl Haley, DOB Apr 10, 1956, MRN [id number]  PCP:  [facility] [place]  Cardiologist:  Cheryl Him, MD   Chief Complaint  Patient presents with   Follow-up    Coronary artery calcifications, hypertension, hyperlipidemia     History of Present Illness:  Cheryl Haley is a 66 y.o. female with a hx of HTN, COPD, HLD, tobacco abuse and palpitations.  Event monitor was normal.  She has chronic DOE related to COPD and is on O2.  She was noted to have coronary calcifications in all 3 coronaries by screening Chest CT for hx of tobacco last fall.  She underwent coronary Ca score in Feb 2021 which was high at 678 and placed her in the 97th% for age and sex matched controls and was also found to have aortic atherosclerosis.    She is here today for followup and is doing well.  She has chronic DOE related to emphysema and is on 2L [place] at home.  Her breathing has not changed.  She denies any chest pain or pressure, PND, orthopnea, LE edema, dizziness, palpitations or syncope. She is compliant with her meds and is tolerating meds with no SE.    Past Medical History:  Diagnosis Date   Agatston coronary artery calcium score greater than 400    score 678   Anxiety    Arthritis    "in neck"   COPD (chronic obstructive pulmonary disease) (HCC)    Dyspnea    Emphysema lung (HCC)    Essential thrombocythemia ([place]) 09/15/2019   History of hiatal hernia    Hx of TIA (transient ischemic attack) and stroke    Hyperlipidemia    Hypertension    Hypothyroidism    Impaired fasting glucose    Pleural effusion 05/15/2014   CXR 05/2014:  New small right effusion.     Pneumonia    Protein-calorie malnutrition, severe 09/17/2018   Pulmonary nodules 07/14/2018   07/2017 CT chest >pulmonary nodules noted.  07/2017 PET scan -Hypermetabolic LUL nodule  10/6832 CT chest >decreased nodule size, new pulmonary nodules noted.  FOB -atypical cells   CT chest 07/2018 >stable nodules to smaller on established nodules . 2 new nodules RUL , RLL >CT chest 3 months    Thyroid disease    Tobacco abuse 11/28/2015   Smoked 2 packs of cigarettes daily from teenage years until age 49, quit in 2016    Unspecified hypothyroidism 09/20/2013   Viral respiratory infection 09/16/2018    Past Surgical History:  Procedure Laterality Date   BRONCHIAL BIOPSY  08/20/2021   Procedure: BRONCHIAL BIOPSIES;  Surgeon: Cheryl Nash, DO;  Location: [facility];  Service: Pulmonary;;   BRONCHIAL BRUSHINGS  08/20/2021   Procedure: BRONCHIAL BRUSHINGS;  Surgeon: Cheryl Nash, DO;  Location: [facility];  Service: Pulmonary;;   BRONCHIAL NEEDLE ASPIRATION BIOPSY  08/20/2021   Procedure: BRONCHIAL NEEDLE ASPIRATION BIOPSIES;  Surgeon: Cheryl Nash, DO;  Location: [facility];  Service: Pulmonary;;   BRONCHIAL WASHINGS  08/20/2021   Procedure: BRONCHIAL WASHINGS;  Surgeon: Cheryl Nash, DO;  Location: [facility];  Service: Pulmonary;;   ESOPHAGOGASTRODUODENOSCOPY     EYE SURGERY     FIDUCIAL MARKER PLACEMENT  08/20/2021   Procedure: FIDUCIAL MARKER PLACEMENT;  Surgeon: Cheryl Nash, DO;  Location: [facility];  Service: Pulmonary;;   VIDEO BRONCHOSCOPY WITH ENDOBRONCHIAL NAVIGATION Left 07/29/2017   Procedure: VIDEO BRONCHOSCOPY WITH ENDOBRONCHIAL NAVIGATION;  Surgeon: Cheryl Gobble, MD;  Location: [facility];  Service: Thoracic;  Laterality: Left;   VIDEO BRONCHOSCOPY WITH RADIAL ENDOBRONCHIAL ULTRASOUND  08/20/2021   Procedure: RADIAL ENDOBRONCHIAL ULTRASOUND;  Surgeon: Cheryl Nash, DO;  Location: [facility];  Service: Pulmonary;;    Current Medications: Current Meds  Medication Sig   albuterol (VENTOLIN HFA) 108 (90 Base) MCG/ACT inhaler Inhale 2 puffs into the lungs every 6 (six) hours as needed for wheezing or shortness of breath.   alendronate (FOSAMAX) 70 MG tablet Take 70 mg by mouth every Saturday.   amitriptyline (ELAVIL)  50 MG tablet Take 50 mg by mouth at bedtime.   aspirin EC 81 MG tablet Take 81 mg daily by mouth.    Bempedoic Acid-Ezetimibe (NEXLIZET) 180-10 MG TABS Take 1 tablet by mouth daily.   Budeson-Glycopyrrol-Formoterol (BREZTRI AEROSPHERE) 160-9-4.8 MCG/ACT AERO Inhale 2 puffs into the lungs 2 (two) times daily.   DALIRESP 500 MCG TABS tablet Take 1 tablet by mouth once daily   feeding supplement (ENSURE ENLIVE / ENSURE PLUS) LIQD Take 237 mLs by mouth 2 (two) times daily between meals.   gabapentin (NEURONTIN) 300 MG capsule Take 300 mg by mouth 2 (two) times daily.   hydroxyurea (HYDREA) 500 MG capsule TAKE 1 CAPSULE BY MOUTH EVERY OTHER DAY TAKE WITH FOOD TO MINIMIZE GI SIDE EFFECTS   hydrOXYzine (ATARAX) 25 MG tablet Take 25 mg by mouth at bedtime as needed (sleep).   levothyroxine (SYNTHROID) 75 MCG tablet Take 75 mcg by mouth every morning.   metoprolol succinate (TOPROL-XL) 25 MG 24 hr tablet Take 1 tablet (25 mg total) by mouth daily.   Multiple Vitamin (MULTIVITAMIN WITH MINERALS) TABS tablet Take 1 tablet by mouth daily.   OXYGEN Inhale 2 L into the lungs continuous.   predniSONE (DELTASONE) 5 MG tablet TAKE 1 TABLET BY MOUTH ONCE DAILY ONCE DAILY WITH BREAKFAST   rosuvastatin (CRESTOR) 40 MG tablet Take 1 tablet (40 mg total) by mouth daily.    Allergies:   Bisoprolol fumarate   Social History   Socioeconomic History   Marital status: Divorced    Spouse name: Not on file   Number of children: Not on file   Years of education: Not on file   Highest education level: Not on file  Occupational History   Not on file  Tobacco Use   Smoking status: Former    Packs/day: 2.00    Years: 40.00    Total pack years: 80.00    Types: Cigarettes    Quit date: 03/16/2017    Years since quitting: 5.3   Smokeless tobacco: Never   Tobacco comments:       Vaping Use   Vaping Use: Never used  Substance and Sexual Activity   Alcohol use: Not Currently   Drug use: No   Sexual activity: Not  on file  Other Topics Concern   Not on file  Social History Narrative   Not on file   Social Determinants of Health   Financial Resource Strain: Not on file  Food Insecurity: Not on file  Transportation Needs: Not on file  Physical Activity: Not on file  Stress: Not on file  Social Connections: Not on file     Family History:  The patient's family history includes Cancer in her sister and sister; Congestive Heart Failure in her sister and sister; Emphysema in her brother, father, sister, and sister; Heart disease in her brother, brother, father, mother, and sister.   ROS:  Please see the history of present illness.    ROS All other systems reviewed and are negative.      No data to display           PHYSICAL EXAM:   VS:  BP (!) 142/80   Pulse 90   Ht '5\' 3"'$  (1.6 m)   Wt 93 lb (42.2 kg)   SpO2 95%   BMI 16.47 kg/m     GEN: Well nourished, well developed in no acute distress HEENT: Normal NECK: No JVD; No carotid bruits LYMPHATICS: No lymphadenopathy CARDIAC:RRR, no murmurs, rubs, gallops RESPIRATORY:  Clear to auscultation without rales, wheezing or rhonchi  ABDOMEN: Soft, non-tender, non-distended MUSCULOSKELETAL:  No edema; No deformity  SKIN: Warm and dry NEUROLOGIC:  Alert and oriented x 3 PSYCHIATRIC:  Normal affect   Wt Readings from Last 3 Encounters:  07/11/22 93 lb (42.2 kg)  06/05/22 91 lb 6.4 oz (41.5 kg)  05/26/22 90 lb 12.8 oz (41.2 kg)      Studies/Labs Reviewed:   EKG:  EKG is not ordered today.    Recent Labs: 01/10/2022: B Natriuretic Peptide 30.9 04/14/2022: Magnesium 2.0 05/26/2022: ALT 27; BUN 19; Creatinine 0.84; Hemoglobin 12.2; Platelet Count 374; Potassium 4.9; Sodium 136   Lipid Panel    Component Value Date/Time   CHOL 158 09/18/2021 0742   TRIG 58 09/18/2021 0742   HDL 59 09/18/2021 0742   CHOLHDL 2.7 09/18/2021 0742   LDLCALC 87 09/18/2021 0742    Additional studies/ records that were reviewed today include:   Coronary Ca score, Lexiscan myoview, 2D echo, event monitor  ASSESSMENT:    1. Agatston coronary artery calcium score greater than 400   2. Primary hypertension   3. Pure hypercholesterolemia   4. Aortic atherosclerosis (HCC)   5. Palpitations      PLAN:  In order of problems listed above:  1.  Coronary Artery Calcifications -noted on chest CT with Ca score 678 -she has chronic DOE related to COPD that is stable -Lexiscan myoview in Feb 21 showed no ischemia -She has not any anginal symptoms since I saw her last -Continue aggressive risk factor modification -Continue prescription drug therapy with aspirin 81 mg daily Crestor 40 mg daily with as needed refills  2.  HTN -BP is borderline controlled on exam -Continue prescription drug management with Toprol-XL 25 mg daily with as needed refills -I have asked her to check her BP daily for a week and call with results  3.  HLD -LDL goal is less than 70 given coronary artery calcifications and aortic atherosclerosis  -she just had lipids drawn and I will get a copy -Continue prescription drug management with Crestor 40 mg daily and Nexlizet 180-'10mg'$  daily with as needed refills  4.  Aortic Atherosclerosis -continue statin  5.  Palpitations -event monitor showed no arrhythmias -No further palpitations noted   Medication Adjustments/Labs and Tests Ordered: Current medicines are reviewed at length with the patient today.  Concerns regarding medicines are outlined above.  Medication changes, Labs and Tests ordered today are listed in the Patient Instructions below.  Patient Instructions  Check your blood pressure daily for one week and call with a list of readings.   Medication Instructions:  Your physician recommends that you continue on your current medications as directed. Please refer to the Current Medication list given to you today.  *If you need a refill on your cardiac medications before your next appointment, please  call your pharmacy*  Lab  Work: TODAY: Fasting lipids and ALT If you have labs (blood work) drawn today and your tests are completely normal, you will receive your results only by: [place] (if you have MyChart) OR A paper copy in the mail If you have any lab test that is abnormal or we need to change your treatment, we will call you to review the results.   Follow-Up: At [facility], you and your health needs are our priority.  As part of our continuing mission to provide you with exceptional heart care, we have created designated Provider Care Teams.  These Care Teams include your primary Cardiologist (physician) and Advanced Practice Providers (APPs -  Physician Assistants and Nurse Practitioners) who all work together to provide you with the care you need, when you need it.  Your next appointment:   1 year(s)  The format for your next appointment:   In Person  Provider:   [name], MD      Important Information About Sugar         Signed, Cheryl Him, MD  07/11/2022 1:30 PM    [address], [place], [place]  [address] Phone: [phone]; Fax: [phone]

## 2022-07-11 NOTE — Patient Instructions (Signed)
Check your blood pressure daily for one week and call with a list of readings.   Medication Instructions:  Your physician recommends that you continue on your current medications as directed. Please refer to the Current Medication list given to you today.  *If you need a refill on your cardiac medications before your next appointment, please call your pharmacy*  Lab Work: TODAY: Fasting lipids and ALT If you have labs (blood work) drawn today and your tests are completely normal, you will receive your results only by: [place] (if you have MyChart) OR A paper copy in the mail If you have any lab test that is abnormal or we need to change your treatment, we will call you to review the results.   Follow-Up: At [facility], you and your health needs are our priority.  As part of our continuing mission to provide you with exceptional heart care, we have created designated Provider Care Teams.  These Care Teams include your primary Cardiologist (physician) and Advanced Practice Providers (APPs -  Physician Assistants and Nurse Practitioners) who all work together to provide you with the care you need, when you need it.  Your next appointment:   1 year(s)  The format for your next appointment:   In Person  Provider:   [name], MD      Important Information About Sugar

## 2022-07-11 NOTE — Addendum Note (Signed)
Addended by: Bernestine Amass on: 07/11/2022 08:28 AM   Modules accepted: Orders

## 2022-07-20 ENCOUNTER — Other Ambulatory Visit: Payer: Self-pay | Admitting: Internal Medicine

## 2022-07-26 ENCOUNTER — Encounter: Payer: Self-pay | Admitting: Cardiology

## 2022-08-04 ENCOUNTER — Other Ambulatory Visit: Payer: Self-pay | Admitting: Cardiology

## 2022-09-02 ENCOUNTER — Telehealth: Payer: Self-pay | Admitting: Pharmacist

## 2022-09-02 ENCOUNTER — Ambulatory Visit: Payer: Medicare Other | Attending: Internal Medicine | Admitting: Pharmacist

## 2022-09-02 DIAGNOSIS — E7800 Pure hypercholesterolemia, unspecified: Secondary | ICD-10-CM | POA: Insufficient documentation

## 2022-09-02 DIAGNOSIS — Z8673 Personal history of transient ischemic attack (TIA), and cerebral infarction without residual deficits: Secondary | ICD-10-CM

## 2022-09-02 DIAGNOSIS — R931 Abnormal findings on diagnostic imaging of heart and coronary circulation: Secondary | ICD-10-CM | POA: Diagnosis present

## 2022-09-02 DIAGNOSIS — E785 Hyperlipidemia, unspecified: Secondary | ICD-10-CM | POA: Insufficient documentation

## 2022-09-02 MED ORDER — REPATHA SURECLICK 140 MG/ML ~~LOC~~ SOAJ: 1.0000 mL | SUBCUTANEOUS | 11 refills | Status: DC

## 2022-09-02 NOTE — Patient Instructions (Addendum)
Continue taking rosuvastatin '40mg'$  daily Continue Nexlizet until your start Repatha, then you may stop Repatha.  I will submit a prior authorization for Repatha. I will call you once I hear back. Please call me at [phone] with any questions.   Repatha is a cholesterol medication that improved your body's ability to get rid of "bad cholesterol" known as LDL. It can lower your LDL up to 60%! It is an injection that is given under the skin every 2 weeks. The medication often requires a prior authorization from your insurance company. We will take care of submitting all the necessary information to your insurance company to get it approved. The most common side effects of Repatha include runny nose, symptoms of the common cold, rarely flu or flu-like symptoms, back/muscle pain in about 3-4% of the patients, and redness, pain, or bruising at the injection site. Tell your healthcare provider if you have any side effect that bothers you or that does not go away.    Please call me at [phone] with any questions patient accompanied by

## 2022-09-02 NOTE — Progress Notes (Signed)
Patient ID: Cheryl Haley                 DOB: November 15, 1955                    MRN: [id number]      HPI: Cheryl Haley is a 67 y.o. female patient referred to lipid clinic by Dr. Radford Pax. PMH is significant for  HTN, COPD, HLD, tobacco abuse and palpitations. She was noted to have coronary calcifications in all 3 coronaries by screening Chest CT for hx of tobacco last fall. She underwent coronary Ca score in Feb 2021 which was high at 678 and placed her in the 97th% for age and sex matched controls and was also found to have aortic atherosclerosis. Pt also with hx of TIA.  Patient accompanied by her sister.  Reports tolerating Nexlizet and rosuvastatin well.  Has Medicare and Medicaid no issues with cost.  Does wear oxygen but walks about 20 minutes every day.  Will walk inside if cold or outside and nicer weather.  Reviewed options for lowering LDL cholesterol including PCSK-9 inhibitors and inclisiran.  Discussed mechanisms of action, dosing, side effects and potential decreases in LDL cholesterol.  Also reviewed cost information.   Current Medications: Nexlizet 10/'180mg'$  daily, rosuvastatin '40mg'$  daily Intolerances: none Risk Factors: CAC 678 (97th percentile), tobacco use, age, TIA LDL-C goal: <70 ApoB goal: <80 Non-HDL cholesterol goal: <100  Diet: Not discussed in detail  Exercise: walks 20 min daily  Family History: The patient's family history includes Cancer in her sister and sister; Congestive Heart Failure in her sister and sister; Emphysema in her brother, father, sister, and sister; Heart disease in her brother, brother, father, mother, and sister.    Social History: Former smoker  Labs: Lipid Panel  07/09/22 TC 134, TG 63, LDL-C 84, HDL 37, non HDL-C 94    Component Value Date/Time   CHOL 158 09/18/2021 0742   TRIG 58 09/18/2021 0742   HDL 59 09/18/2021 0742   CHOLHDL 2.7 09/18/2021 0742   LDLCALC 87 09/18/2021 0742   LABVLDL 12 09/18/2021 0742    Past Medical  History:  Diagnosis Date   Agatston coronary artery calcium score greater than 400    score 678   Anxiety    Arthritis    "in neck"   COPD (chronic obstructive pulmonary disease) (HCC)    Dyspnea    Emphysema lung (HCC)    Essential thrombocythemia ([place]) 09/15/2019   History of hiatal hernia    Hx of TIA (transient ischemic attack) and stroke    Hyperlipidemia    Hypertension    Hypothyroidism    Impaired fasting glucose    Pleural effusion 05/15/2014   CXR 05/2014:  New small right effusion.     Pneumonia    Protein-calorie malnutrition, severe 09/17/2018   Pulmonary nodules 07/14/2018   07/2017 CT chest >pulmonary nodules noted.  07/2017 PET scan -Hypermetabolic LUL nodule  12/9933 CT chest >decreased nodule size, new pulmonary nodules noted.  FOB -atypical cells  CT chest 07/2018 >stable nodules to smaller on established nodules . 2 new nodules RUL , RLL >CT chest 3 months    Thyroid disease    Tobacco abuse 11/28/2015   Smoked 2 packs of cigarettes daily from teenage years until age 71, quit in 2016    Unspecified hypothyroidism 09/20/2013   Viral respiratory infection 09/16/2018    Current Outpatient Medications on File Prior to Visit  Medication Sig Dispense Refill  albuterol (VENTOLIN HFA) 108 (90 Base) MCG/ACT inhaler Inhale 2 puffs into the lungs every 6 (six) hours as needed for wheezing or shortness of breath. 18 g 6   alendronate (FOSAMAX) 70 MG tablet Take 70 mg by mouth every Saturday.     amitriptyline (ELAVIL) 50 MG tablet Take 50 mg by mouth at bedtime.     aspirin EC 81 MG tablet Take 81 mg daily by mouth.      Bempedoic Acid-Ezetimibe (NEXLIZET) 180-10 MG TABS Take 1 tablet by mouth daily. 90 tablet 3   Budeson-Glycopyrrol-Formoterol (BREZTRI AEROSPHERE) 160-9-4.8 MCG/ACT AERO Inhale 2 puffs into the lungs 2 (two) times daily. 10.7 g 5   DALIRESP 500 MCG TABS tablet Take 1 tablet by mouth once daily 30 tablet 5   feeding supplement (ENSURE ENLIVE / ENSURE  PLUS) LIQD Take 237 mLs by mouth 2 (two) times daily between meals. 237 mL 12   gabapentin (NEURONTIN) 300 MG capsule Take 300 mg by mouth 2 (two) times daily.     hydroxyurea (HYDREA) 500 MG capsule TAKE 1 CAPSULE BY MOUTH EVERY OTHER DAY TAKE WITH FOOD TO MINIMIZE GI SIDE EFFECTS 45 capsule 6   hydrOXYzine (ATARAX) 25 MG tablet Take 25 mg by mouth at bedtime as needed (sleep).     levothyroxine (SYNTHROID) 75 MCG tablet Take 75 mcg by mouth every morning.     metoprolol succinate (TOPROL-XL) 25 MG 24 hr tablet Take 1 tablet (25 mg total) by mouth daily. 90 tablet 3   Multiple Vitamin (MULTIVITAMIN WITH MINERALS) TABS tablet Take 1 tablet by mouth daily.     OXYGEN Inhale 2 L into the lungs continuous.     predniSONE (DELTASONE) 5 MG tablet Take 1 tablet by mouth once daily with breakfast 30 tablet 2   rosuvastatin (CRESTOR) 40 MG tablet Take 1 tablet by mouth once daily 90 tablet 3   No current facility-administered medications on file prior to visit.    Allergies  Allergen Reactions   Bisoprolol Fumarate Other (See Comments)    Other reaction(s): heart issues    Assessment/Plan:  1. Hyperlipidemia -  Hyperlipidemia Assessment: LDL-C and is above goal less than 70 on Nextlizet and rosuvastatin 40 mg daily Non-HDL cholesterol is actually at goal but given history of TIA and coronary calcium score 678 more aggressive lowering is very reasonable Advantages of being more aggressive and patient's desire to lower her risk factors was discussed No issues with tolerability or with cost Patient hesitant to injection but her sister is comfortable and can give them to her Injection technique, side effects and dosing reviewed patient Discussed that most likely patient is only saying about a 30% reduction from the nextlizet, can see closer to a 50 or even 60% reduction with PCSK9i Walks daily for 20 minutes Plan: Will submit prior authorization for Repatha 140 mg every 14 days Continue  rosuvastatin 40 mg daily Continue Nexlizet until Repatha started then can stop nextlizet Patient will go to [place] after 4 injections to get repeat lipid panel.  Expect LDL-C to be at goal at that point, but can consider adding back to just [place]et[place] if needed at that time I will call patient once I hear back from insurance   Addendum: Repatha approved through 09/01/23  Thank you,  Ramond Dial, Pharm.D, BCPS, CPP [facility] [address], [place], [place] [address]  Phone: [phone]; Fax: [phone]

## 2022-09-02 NOTE — Telephone Encounter (Signed)
Patient to let her know that Repatha was approved.  No answer, left a voicemail for patient to call back.

## 2022-09-02 NOTE — Assessment & Plan Note (Signed)
Assessment: LDL-C and is above goal less than 70 on Nextlizet and rosuvastatin 40 mg daily Non-HDL cholesterol is actually at goal but given history of TIA and coronary calcium score 678 more aggressive lowering is very reasonable Advantages of being more aggressive and patient's desire to lower her risk factors was discussed No issues with tolerability or with cost Patient hesitant to injection but her sister is comfortable and can give them to her Injection technique, side effects and dosing reviewed patient Discussed that most likely patient is only saying about a 30% reduction from the nextlizet, can see closer to a 50 or even 60% reduction with PCSK9i Walks daily for 20 minutes Plan: Will submit prior authorization for Repatha 140 mg every 14 days Continue rosuvastatin 40 mg daily Continue Nexlizet until Repatha started then can stop nextlizet Patient will go to [place] after 4 injections to get repeat lipid panel.  Expect LDL-C to be at goal at that point, but can consider adding back to just Zetia if needed at that time I will call patient once I hear back from insurance

## 2022-09-02 NOTE — Telephone Encounter (Signed)
Patient called back. States her pharmacy has already filled rx. Will get labs in 8 weeks at [place]

## 2022-09-13 ENCOUNTER — Other Ambulatory Visit: Payer: Self-pay | Admitting: Cardiology

## 2022-09-24 ENCOUNTER — Other Ambulatory Visit: Payer: Self-pay

## 2022-09-24 ENCOUNTER — Inpatient Hospital Stay (HOSPITAL_BASED_OUTPATIENT_CLINIC_OR_DEPARTMENT_OTHER): Payer: Medicare Other | Admitting: Oncology

## 2022-09-24 ENCOUNTER — Encounter: Payer: Self-pay | Admitting: Oncology

## 2022-09-24 ENCOUNTER — Inpatient Hospital Stay: Payer: Medicare Other | Attending: Hematology & Oncology

## 2022-09-24 VITALS — BP 133/59 | HR 77 | Temp 98.1°F | Resp 19 | Ht 63.0 in | Wt 99.0 lb

## 2022-09-24 DIAGNOSIS — D509 Iron deficiency anemia, unspecified: Secondary | ICD-10-CM

## 2022-09-24 DIAGNOSIS — Z7982 Long term (current) use of aspirin: Secondary | ICD-10-CM | POA: Insufficient documentation

## 2022-09-24 DIAGNOSIS — D473 Essential (hemorrhagic) thrombocythemia: Secondary | ICD-10-CM

## 2022-09-24 LAB — CBC WITH DIFFERENTIAL (CANCER CENTER ONLY)
Abs Immature Granulocytes: 0.17 10*3/uL — ABNORMAL HIGH (ref 0.00–0.07)
Basophils Absolute: 0.1 10*3/uL (ref 0.0–0.1)
Basophils Relative: 1 %
Eosinophils Absolute: 0 10*3/uL (ref 0.0–0.5)
Eosinophils Relative: 1 %
HCT: 40.5 % (ref 36.0–46.0)
Hemoglobin: 12.7 g/dL (ref 12.0–15.0)
Immature Granulocytes: 2 %
Lymphocytes Relative: 14 %
Lymphs Abs: 1.1 10*3/uL (ref 0.7–4.0)
MCH: 29.7 pg (ref 26.0–34.0)
MCHC: 31.4 g/dL (ref 30.0–36.0)
MCV: 94.6 fL (ref 80.0–100.0)
Monocytes Absolute: 0.4 10*3/uL (ref 0.1–1.0)
Monocytes Relative: 5 %
Neutro Abs: 5.8 10*3/uL (ref 1.7–7.7)
Neutrophils Relative %: 77 %
Platelet Count: 356 10*3/uL (ref 150–400)
RBC: 4.28 MIL/uL (ref 3.87–5.11)
RDW: 16.8 % — ABNORMAL HIGH (ref 11.5–15.5)
WBC Count: 7.6 10*3/uL (ref 4.0–10.5)
nRBC: 0 % (ref 0.0–0.2)

## 2022-09-24 LAB — CMP (CANCER CENTER ONLY)
ALT: 32 U/L (ref 0–44)
AST: 32 U/L (ref 15–41)
Albumin: 4.4 g/dL (ref 3.5–5.0)
Alkaline Phosphatase: 49 U/L (ref 38–126)
Anion gap: 8 (ref 5–15)
BUN: 10 mg/dL (ref 8–23)
CO2: 31 mmol/L (ref 22–32)
Calcium: 9.6 mg/dL (ref 8.9–10.3)
Chloride: 101 mmol/L (ref 98–111)
Creatinine: 0.79 mg/dL (ref 0.44–1.00)
GFR, Estimated: >60 mL/min (ref >60)
Glucose, Bld: 107 mg/dL — ABNORMAL HIGH (ref 70–99)
Potassium: 4.6 mmol/L (ref 3.5–5.1)
Sodium: 140 mmol/L (ref 135–145)
Total Bilirubin: 0.4 mg/dL (ref 0.3–1.2)
Total Protein: 7.3 g/dL (ref 6.5–8.1)

## 2022-09-24 LAB — FERRITIN: Ferritin: 36 ng/mL (ref 11–307)

## 2022-09-24 LAB — LACTATE DEHYDROGENASE: LDH: 157 U/L (ref 98–192)

## 2022-09-24 NOTE — Progress Notes (Signed)
Hematology and Oncology Follow Up Visit  Cheryl Haley [id number] October 15, 1955 67 y.o. 09/24/2022   Principle Diagnosis:  Essential thrombocythemia, JAK2 V617F+; high risk by IPSET    Current Therapy: ASA '81mg'$  daily Hydrea, currently '500mg'$  every 2 days, 10/2019 - present   Interim History:  Cheryl Haley is here today with her daughter for follow-up.  She is starting to feel much better.  She is gaining back 9 pounds.  Her daughter reports that she stays active and is trying to walk more.  No longer taking in Ensure because she is eating better. Shortness of breath is stable with supplemental oxygen. She is not having any fevers, chills, chest pain, nausea, vomiting, cough, rash, dizziness, skin ulcerations. No blood loss reported.  Bruises easily.  ECOG Performance Status: 1 - Symptomatic but completely ambulatory  Medications:  Allergies as of 09/24/2022       Reactions   Bisoprolol Fumarate Other (See Comments)   Other reaction(s): heart issues        Medication List        Accurate as of September 24, 2022 10:36 AM. If you have any questions, ask your nurse or doctor.          STOP taking these medications    feeding supplement Liqd Stopped by: Mikey Bussing, NP   hydrOXYzine 25 MG tablet Commonly known as: ATARAX Stopped by: Mikey Bussing, NP       TAKE these medications    albuterol 108 (90 Base) MCG/ACT inhaler Commonly known as: VENTOLIN HFA Inhale 2 puffs into the lungs every 6 (six) hours as needed for wheezing or shortness of breath.   alendronate 70 MG tablet Commonly known as: FOSAMAX Take 70 mg by mouth every Saturday.   amitriptyline 50 MG tablet Commonly known as: ELAVIL Take 50 mg by mouth at bedtime.   aspirin EC 81 MG tablet Take 81 mg daily by mouth.   Breztri Aerosphere 160-9-4.8 MCG/ACT Aero Generic drug: Budeson-Glycopyrrol-Formoterol Inhale 2 puffs into the lungs 2 (two) times daily.   Daliresp 500 MCG Tabs  tablet Generic drug: roflumilast Take 1 tablet by mouth once daily   gabapentin 300 MG capsule Commonly known as: NEURONTIN Take 300 mg by mouth 2 (two) times daily.   hydroxyurea 500 MG capsule Commonly known as: HYDREA TAKE 1 CAPSULE BY MOUTH EVERY OTHER DAY TAKE WITH FOOD TO MINIMIZE GI SIDE EFFECTS   levothyroxine 75 MCG tablet Commonly known as: SYNTHROID Take 75 mcg by mouth every morning.   metoprolol succinate 25 MG 24 hr tablet Commonly known as: TOPROL-XL Take 1 tablet (25 mg total) by mouth daily.   multivitamin with minerals Tabs tablet Take 1 tablet by mouth daily.   OXYGEN Inhale 2 L into the lungs continuous.   predniSONE 5 MG tablet Commonly known as: DELTASONE Take 1 tablet by mouth once daily with breakfast   Repatha SureClick 998 MG/ML Soaj Generic drug: Evolocumab Inject 140 mg into the skin every 14 (fourteen) days.   rosuvastatin 40 MG tablet Commonly known as: CRESTOR Take 1 tablet by mouth once daily        Allergies:  Allergies  Allergen Reactions   Bisoprolol Fumarate Other (See Comments)    Other reaction(s): heart issues    Past Medical History, Surgical history, Social history, and Family History were reviewed and updated.  Review of Systems: All other 10 point review of systems is negative.   Physical Exam:  vitals were not taken for this visit.  Wt Readings from Last 3 Encounters:  07/11/22 42.2 kg  06/05/22 41.5 kg  05/26/22 41.2 kg    Ocular: Sclerae unicteric, pupils equal, round and reactive to light Ear-nose-throat: Oropharynx clear, dentition fair Lymphatic: No cervical or supraclavicular adenopathy Lungs no rales or rhonchi, good excursion bilaterally Heart regular rate and rhythm, no murmur appreciated Abd soft, nontender, positive bowel sounds MSK no focal spinal tenderness, no joint edema Neuro: non-focal, well-oriented, appropriate affect Breasts: Deferred   Lab Results  Component Value Date   WBC  7.6 09/24/2022   HGB 12.7 09/24/2022   HCT 40.5 09/24/2022   MCV 94.6 09/24/2022   PLT 356 09/24/2022   Lab Results  Component Value Date   FERRITIN 90 05/26/2022   IRON 48 05/26/2022   TIBC 398 05/26/2022   UIBC 350 05/26/2022   IRONPCTSAT 12 05/26/2022   Lab Results  Component Value Date   RBC 4.28 09/24/2022   No results found for: "KPAFRELGTCHN", "LAMBDASER", "KAPLAMBRATIO" No results found for: "IGGSERUM", "IGA", "IGMSERUM" Lab Results  Component Value Date   TOTALPROTELP 6.8 09/23/2018   ALBUMINELP 3.9 09/23/2018   A1GS 0.1 09/23/2018   A2GS 0.9 09/23/2018   BETS 0.8 09/23/2018   GAMS 1.0 09/23/2018   MSPIKE Not Observed 09/23/2018   SPEI Comment 09/23/2018     Chemistry      Component Value Date/Time   NA 136 05/26/2022 1400   K 4.9 05/26/2022 1400   CL 97 (L) 05/26/2022 1400   CO2 31 05/26/2022 1400   BUN 19 05/26/2022 1400   CREATININE 0.84 05/26/2022 1400      Component Value Date/Time   CALCIUM 10.5 (H) 05/26/2022 1400   ALKPHOS 50 05/26/2022 1400   AST 34 05/26/2022 1400   ALT 27 05/26/2022 1400   BILITOT 0.4 05/26/2022 1400       Impression and Plan: Cheryl Haley is a very pleasant 67 yo female with essential thrombocythemia, JAK2 V617F+; high risk by IPSET.  She is doing well and her counts are stable at this time.  She will continue taking her Hydrea every other day.  Iron studies are pending.  Follow-up in 4 months.   Mikey Bussing, NP 1/24/202410:36 AM

## 2022-09-25 LAB — IRON AND IRON BINDING CAPACITY (CC-WL,HP ONLY)
Iron: 93 ug/dL (ref 28–170)
Saturation Ratios: 25 % (ref 10.4–31.8)
TIBC: 372 ug/dL (ref 250–450)
UIBC: 279 ug/dL (ref 148–442)

## 2022-10-01 ENCOUNTER — Ambulatory Visit: Payer: Medicare Other | Admitting: Cardiology

## 2022-10-10 ENCOUNTER — Other Ambulatory Visit: Payer: Self-pay | Admitting: Internal Medicine

## 2022-10-15 ENCOUNTER — Other Ambulatory Visit (HOSPITAL_COMMUNITY): Payer: Self-pay

## 2022-10-27 ENCOUNTER — Telehealth: Payer: Self-pay | Admitting: Pulmonary Disease

## 2022-10-27 ENCOUNTER — Other Ambulatory Visit: Payer: Self-pay | Admitting: Family

## 2022-10-27 ENCOUNTER — Other Ambulatory Visit: Payer: Self-pay | Admitting: Pulmonary Disease

## 2022-10-27 ENCOUNTER — Inpatient Hospital Stay: Payer: Medicare Other | Attending: Hematology & Oncology

## 2022-10-27 DIAGNOSIS — D473 Essential (hemorrhagic) thrombocythemia: Secondary | ICD-10-CM | POA: Diagnosis present

## 2022-10-27 DIAGNOSIS — D509 Iron deficiency anemia, unspecified: Secondary | ICD-10-CM

## 2022-10-27 LAB — CMP (CANCER CENTER ONLY)
ALT: 28 U/L (ref 0–44)
AST: 26 U/L (ref 15–41)
Albumin: 4.3 g/dL (ref 3.5–5.0)
Alkaline Phosphatase: 46 U/L (ref 38–126)
Anion gap: 6 (ref 5–15)
BUN: 12 mg/dL (ref 8–23)
CO2: 33 mmol/L — ABNORMAL HIGH (ref 22–32)
Calcium: 9.4 mg/dL (ref 8.9–10.3)
Chloride: 101 mmol/L (ref 98–111)
Creatinine: 0.86 mg/dL (ref 0.44–1.00)
GFR, Estimated: >60 mL/min (ref >60)
Glucose, Bld: 104 mg/dL — ABNORMAL HIGH (ref 70–99)
Potassium: 4.4 mmol/L (ref 3.5–5.1)
Sodium: 140 mmol/L (ref 135–145)
Total Bilirubin: 0.5 mg/dL (ref 0.3–1.2)
Total Protein: 6.9 g/dL (ref 6.5–8.1)

## 2022-10-27 LAB — CBC WITH DIFFERENTIAL (CANCER CENTER ONLY)
Abs Immature Granulocytes: 0.05 10*3/uL (ref 0.00–0.07)
Basophils Absolute: 0 10*3/uL (ref 0.0–0.1)
Basophils Relative: 1 %
Eosinophils Absolute: 0.1 10*3/uL (ref 0.0–0.5)
Eosinophils Relative: 1 %
HCT: 40.9 % (ref 36.0–46.0)
Hemoglobin: 13.1 g/dL (ref 12.0–15.0)
Immature Granulocytes: 1 %
Lymphocytes Relative: 17 %
Lymphs Abs: 1 10*3/uL (ref 0.7–4.0)
MCH: 30.3 pg (ref 26.0–34.0)
MCHC: 32 g/dL (ref 30.0–36.0)
MCV: 94.7 fL (ref 80.0–100.0)
Monocytes Absolute: 0.4 10*3/uL (ref 0.1–1.0)
Monocytes Relative: 7 %
Neutro Abs: 4.3 10*3/uL (ref 1.7–7.7)
Neutrophils Relative %: 73 %
Platelet Count: 336 10*3/uL (ref 150–400)
RBC: 4.32 MIL/uL (ref 3.87–5.11)
RDW: 16.7 % — ABNORMAL HIGH (ref 11.5–15.5)
WBC Count: 5.8 10*3/uL (ref 4.0–10.5)
nRBC: 0 % (ref 0.0–0.2)

## 2022-10-27 LAB — IRON AND IRON BINDING CAPACITY (CC-WL,HP ONLY)
Iron: 82 ug/dL (ref 28–170)
Saturation Ratios: 22 % (ref 10.4–31.8)
TIBC: 378 ug/dL (ref 250–450)
UIBC: 296 ug/dL (ref 148–442)

## 2022-10-27 LAB — LACTATE DEHYDROGENASE: LDH: 158 U/L (ref 98–192)

## 2022-10-27 LAB — FERRITIN: Ferritin: 21 ng/mL (ref 11–307)

## 2022-10-27 MED ORDER — PREDNISONE 5 MG PO TABS: 5.0000 mg | ORAL_TABLET | Freq: Every day | ORAL | 2 refills | Status: DC

## 2022-10-27 NOTE — Telephone Encounter (Signed)
Called and spoke with patient and went over her medication refill request. Martin Majestic over pharmacy. Nothing further needed

## 2022-10-27 NOTE — Telephone Encounter (Signed)
Needs follow up appt  Should have follow up in April 2024 [place]

## 2022-10-27 NOTE — Telephone Encounter (Signed)
Patient states needs refill for Prednisone. [facility]. Patient phone number is [phone].

## 2022-10-28 LAB — LIPID PANEL
Chol/HDL Ratio: 1.7 ratio (ref 0.0–4.4)
Cholesterol, Total: 105 mg/dL (ref 100–199)
HDL: 63 mg/dL (ref >39)
LDL Chol Calc (NIH): 30 mg/dL (ref 0–99)
Triglycerides: 45 mg/dL (ref 0–149)
VLDL Cholesterol Cal: 12 mg/dL (ref 5–40)

## 2022-11-07 ENCOUNTER — Other Ambulatory Visit: Payer: Self-pay | Admitting: Internal Medicine

## 2022-12-05 ENCOUNTER — Ambulatory Visit (HOSPITAL_BASED_OUTPATIENT_CLINIC_OR_DEPARTMENT_OTHER)
Admission: RE | Admit: 2022-12-05 | Discharge: 2022-12-05 | Disposition: A | Discharge status: Home / Self Care | Payer: Medicare Other | Source: Ambulatory Visit | Attending: Pulmonary Disease | Admitting: Pulmonary Disease

## 2022-12-05 DIAGNOSIS — R918 Other nonspecific abnormal finding of lung field: Secondary | ICD-10-CM | POA: Insufficient documentation

## 2022-12-07 ENCOUNTER — Other Ambulatory Visit: Payer: Self-pay | Admitting: Internal Medicine

## 2022-12-08 ENCOUNTER — Other Ambulatory Visit: Payer: Self-pay | Admitting: Hematology & Oncology

## 2022-12-08 DIAGNOSIS — D649 Anemia, unspecified: Secondary | ICD-10-CM

## 2022-12-17 ENCOUNTER — Telehealth: Payer: Self-pay

## 2022-12-17 ENCOUNTER — Ambulatory Visit (INDEPENDENT_AMBULATORY_CARE_PROVIDER_SITE_OTHER): Payer: Medicare Other | Admitting: Pulmonary Disease

## 2022-12-17 ENCOUNTER — Encounter: Payer: Self-pay | Admitting: Pulmonary Disease

## 2022-12-17 ENCOUNTER — Other Ambulatory Visit (HOSPITAL_COMMUNITY): Payer: Self-pay

## 2022-12-17 VITALS — BP 120/60 | HR 88 | Ht 63.0 in | Wt 105.0 lb

## 2022-12-17 DIAGNOSIS — J449 Chronic obstructive pulmonary disease, unspecified: Secondary | ICD-10-CM

## 2022-12-17 DIAGNOSIS — J432 Centrilobular emphysema: Secondary | ICD-10-CM

## 2022-12-17 DIAGNOSIS — J9611 Chronic respiratory failure with hypoxia: Secondary | ICD-10-CM

## 2022-12-17 DIAGNOSIS — E43 Unspecified severe protein-calorie malnutrition: Secondary | ICD-10-CM

## 2022-12-17 DIAGNOSIS — J984 Other disorders of lung: Secondary | ICD-10-CM

## 2022-12-17 NOTE — Telephone Encounter (Signed)
I spoke to pt's pharmacy and was informed pt has the same insuracne but the type of plan pt had has changed and DALIRESP 500 MCG is no longer covered. So, I sent a message to our prior auth's team to check into it and to see what is covered by patients ins. I also called pt and informed her of ins plan change and prior auth team to work on finding a solution for pt's medicine. I will call pt when we get more info from prior auth team. Pt verbalized understanding.

## 2022-12-17 NOTE — Patient Instructions (Addendum)
Thank you for visiting Dr. Tonia Brooms at [facility]. Today we recommend the following:  Orders Placed This Encounter  Procedures   AMB referral to pulmonary rehabilitation   Pulmonary Function Test   6 minute walk   Follow up on PA for Daliresp  Continue breztri   Return in about 2 months (around 02/16/2023) for w/ Rubye Oaks, NP , after PFTs, after .    Please do your part to reduce the spread of COVID-19.

## 2022-12-17 NOTE — Progress Notes (Signed)
Synopsis: Referred in November 2022 for lung nodule by [facility], [facility] Otto *  Subjective:   PATIENT ID: Cheryl Haley GENDER: female DOB: Jun 07, 1956, MRN: [id number]  Chief Complaint  Patient presents with   Follow-up    F/up CT scan    This is a 67 year old female, past medical history of significant emphysema, hypertension, hyperlipidemia, former tobacco abuse, quit in 2016.  In 2018 she had a left upper lobe PET avid nodule that was taken for navigational bronchoscopy by Dr. Delton Coombes.  This was nondiagnostic and subsequent CT imaging revealed resolution of this left upper lobe nodule.  She is been followed now by some time from Dr. Marchelle Gearing in his clinic.  Ultimately patient had additional CT imaging done which showed a new left lung nodule.  Also has left upper lobe posterior nodules that look more inflammatory.  Patient ultimately had a nuclear medicine PET scan done which showed low-level uptake within both of these lesions.  Patient is very anxious about this due to the family history of malignancy and lung cancer.  Today in the office we reviewed her CT images and discuss plans.  OV 08/29/2021: Here today for follow-up after recent bronchoscopy.  Bronchoscopy results are negative for malignancy.  Between her PET scan and the super D the nodule has progressed some suggestive of an infectious etiology.  Cultures so far no growth to date.  She was treated with antibiotics and she completes her antibiotic course today.  From a respiratory standpoint she is doing well using her Breztri plus Daliresp and as needed albuterol.  Follows with Dr. Marchelle Gearing in pulmonary clinic.  For her advanced COPD and chronic hypoxemic respiratory failure.  OV 06/05/2022: Here today for follow up regarding lung nodules. She had a repeat pet scan complete. patient has no respiratory complaints.  Using [place] daily as well as as needed albuterol.  She still has a partially cavitary lesion in the right lung with  areas of consolidation that appears to be inflammatory it is hypermetabolic on PET scan.  We reviewed this today.  OV 12/17/2022: Here today for follow-up.  Follow-up CT imaging reviewed.  Cavitary area in the right upper lobe stable.  Extensive emphysema.  She is doing well with Breztri.  Has not issues with getting Daliresp.  She has a couple more days left but she needs prior authorization fixed.  I have her office follow-up on that.  From respiratory standpoint she is doing okay.  Had some questions today about possibility of endobronchial valve placement for bronchoscopic lung volume reduction surgery.    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     Pneumonia    Protein-calorie malnutrition, severe 09/17/2018   Pulmonary nodules 07/14/2018   07/2017 CT chest >pulmonary nodules noted.  07/2017 PET scan -Hypermetabolic LUL nodule  12/2017 CT chest >decreased nodule size, new pulmonary nodules noted.  FOB -atypical cells  CT chest 07/2018 >stable nodules to smaller  on established nodules . 2 new nodules RUL , RLL >CT chest 3 months    Thyroid disease    Tobacco abuse 11/28/2015   Smoked 2 packs of cigarettes daily from teenage years until age 71, quit in 2016    Unspecified hypothyroidism 09/20/2013   Viral respiratory infection 09/16/2018     Family History  Problem Relation Age of Onset   Emphysema Father    Heart disease Father    Emphysema Brother    Emphysema Sister    Emphysema Sister    Heart disease Mother    Heart  disease Sister    Heart disease Brother    Heart disease Brother    Congestive Heart Failure Sister    Congestive Heart Failure Sister    Cancer Sister        lung   Cancer Sister        breast     Past Surgical History:  Procedure Laterality Date   BRONCHIAL BIOPSY  08/20/2021   Procedure: BRONCHIAL BIOPSIES;  Surgeon: Josephine Igo, DO;  Location: [facility];  Service: Pulmonary;;   BRONCHIAL BRUSHINGS  08/20/2021   Procedure: BRONCHIAL BRUSHINGS;  Surgeon: Josephine Igo, DO;  Location: [facility];  Service: Pulmonary;;   BRONCHIAL NEEDLE ASPIRATION BIOPSY  08/20/2021   Procedure: BRONCHIAL NEEDLE ASPIRATION BIOPSIES;  Surgeon: Josephine Igo, DO;  Location: [facility];  Service: Pulmonary;;   BRONCHIAL WASHINGS  08/20/2021   Procedure: BRONCHIAL WASHINGS;  Surgeon: Josephine Igo, DO;  Location: [facility];  Service: Pulmonary;;   ESOPHAGOGASTRODUODENOSCOPY     EYE SURGERY     FIDUCIAL MARKER PLACEMENT  08/20/2021   Procedure: FIDUCIAL MARKER PLACEMENT;  Surgeon: Josephine Igo, DO;  Location: [facility];  Service: Pulmonary;;   VIDEO BRONCHOSCOPY WITH ENDOBRONCHIAL NAVIGATION Left 07/29/2017   Procedure: VIDEO BRONCHOSCOPY WITH ENDOBRONCHIAL NAVIGATION;  Surgeon: Leslye Peer, MD;  Location: [facility];  Service: Thoracic;  Laterality: Left;   VIDEO BRONCHOSCOPY WITH RADIAL ENDOBRONCHIAL ULTRASOUND  08/20/2021   Procedure: RADIAL ENDOBRONCHIAL ULTRASOUND;  Surgeon: Josephine Igo, DO;  Location: [facility];  Service: Pulmonary;;    Social History   Socioeconomic History   Marital status: Divorced    Spouse name: Not on file   Number of children: Not on file   Years of education: Not on file   Highest education level: Not on file  Occupational History   Not on file  Tobacco Use   Smoking status: Former    Packs/day: 2.00    Years: 40.00    Additional pack years: 0.00    Total pack years: 80.00    Types: Cigarettes    Quit date: 03/16/2017     Years since quitting: 5.7   Smokeless tobacco: Never   Tobacco comments:       Vaping Use   Vaping Use: Never used  Substance and Sexual Activity   Alcohol use: Not Currently   Drug use: No   Sexual activity: Not on file  Other Topics Concern   Not on file  Social History Narrative   Not on file   Social Determinants of Health   Financial Resource Strain: Not on file  Food Insecurity: Not on file  Transportation Needs: Not on file  Physical Activity: Not on file  Stress: Not on file  Social Connections: Not on file  Intimate Partner Violence: Not on file     Allergies  Allergen Reactions   Bisoprolol Fumarate Other (See Comments)  Other reaction(s): heart issues     Outpatient Medications Prior to Visit  Medication Sig Dispense Refill   albuterol (VENTOLIN HFA) 108 (90 Base) MCG/ACT inhaler Inhale 2 puffs into the lungs every 6 (six) hours as needed for wheezing or shortness of breath. 18 g 6   alendronate (FOSAMAX) 70 MG tablet Take 70 mg by mouth every Saturday.     amitriptyline (ELAVIL) 50 MG tablet Take 50 mg by mouth at bedtime.     aspirin EC 81 MG tablet Take 81 mg daily by mouth.      Budeson-Glycopyrrol-Formoterol (BREZTRI AEROSPHERE) 160-9-4.8 MCG/ACT AERO INHALE 2 PUFFS TWICE DAILY 11 g 5   DALIRESP 500 MCG TABS tablet Take 1 tablet by mouth once daily 30 tablet 2   Evolocumab (REPATHA SURECLICK) 140 MG/ML SOAJ Inject 140 mg into the skin every 14 (fourteen) days. 2 mL 11   gabapentin (NEURONTIN) 300 MG capsule Take 300 mg by mouth 2 (two) times daily.     hydroxyurea (HYDREA) 500 MG capsule TAKE 1 CAPSULE BY MOUTH EVERY OTHER DAY WITH FOOD TO  MINIMIZE  GI  SIDE  EFFECTS 45 capsule 0   levothyroxine (SYNTHROID) 75 MCG tablet Take 75 mcg by mouth every morning.     metoprolol succinate (TOPROL-XL) 25 MG 24 hr tablet Take 1 tablet (25 mg total) by mouth daily. 90 tablet 3   Multiple Vitamin (MULTIVITAMIN WITH MINERALS) TABS tablet Take 1 tablet by mouth  daily.     OXYGEN Inhale 2 L into the lungs continuous.     predniSONE (DELTASONE) 5 MG tablet Take 1 tablet (5 mg total) by mouth daily with breakfast. 30 tablet 2   rosuvastatin (CRESTOR) 40 MG tablet Take 1 tablet by mouth once daily 90 tablet 3   No facility-administered medications prior to visit.    Review of Systems  Constitutional:  Negative for chills, fever, malaise/fatigue and weight loss.  HENT:  Negative for hearing loss, sore throat and tinnitus.   Eyes:  Negative for blurred vision and double vision.  Respiratory:  Positive for shortness of breath. Negative for cough, hemoptysis, sputum production, wheezing and stridor.   Cardiovascular:  Negative for chest pain, palpitations, orthopnea, leg swelling and PND.  Gastrointestinal:  Negative for abdominal pain, constipation, diarrhea, heartburn, nausea and vomiting.  Genitourinary:  Negative for dysuria, hematuria and urgency.  Musculoskeletal:  Negative for joint pain and myalgias.  Skin:  Negative for itching and rash.  Neurological:  Negative for dizziness, tingling, weakness and headaches.  Endo/Heme/Allergies:  Negative for environmental allergies. Does not bruise/bleed easily.  Psychiatric/Behavioral:  Negative for depression. The patient is not nervous/anxious and does not have insomnia.   All other systems reviewed and are negative.    Objective:  Physical Exam Vitals reviewed.  Constitutional:      General: She is not in acute distress.    Appearance: She is well-developed.  HENT:     Head: Normocephalic and atraumatic.  Eyes:     General: No scleral icterus.    Conjunctiva/sclera: Conjunctivae normal.     Pupils: Pupils are equal, round, and reactive to light.  Neck:     Vascular: No JVD.     Trachea: No tracheal deviation.  Cardiovascular:     Rate and Rhythm: Normal rate and regular rhythm.     Heart sounds: Normal heart sounds. No murmur heard. Pulmonary:     Effort: Pulmonary effort is normal. No  tachypnea, accessory muscle usage or respiratory distress.  Breath sounds: No stridor. No wheezing, rhonchi or rales.  Abdominal:     General: There is no distension.     Palpations: Abdomen is soft.     Tenderness: There is no abdominal tenderness.  Musculoskeletal:        General: No tenderness.     Cervical back: Neck supple.  Lymphadenopathy:     Cervical: No cervical adenopathy.  Skin:    General: Skin is warm and dry.     Capillary Refill: Capillary refill takes less than 2 seconds.     Findings: No rash.  Neurological:     Mental Status: She is alert and oriented to person, place, and time.  Psychiatric:        Behavior: Behavior normal.      Vitals:   12/17/22 0902  BP: 120/60  Pulse: 88  SpO2: 98%  Weight: 105 lb (47.6 kg)  Height: 5\' 3"  (1.6 m)   98% on RA BMI Readings from Last 3 Encounters:  12/17/22 18.60 kg/m  09/24/22 17.54 kg/m  07/11/22 16.47 kg/m   Wt Readings from Last 3 Encounters:  12/17/22 105 lb (47.6 kg)  09/24/22 99 lb (44.9 kg)  07/11/22 93 lb (42.2 kg)     CBC    Component Value Date/Time   WBC 5.8 10/27/2022 1006   WBC 6.9 04/14/2022 1502   RBC 4.32 10/27/2022 1006   HGB 13.1 10/27/2022 1006   HCT 40.9 10/27/2022 1006   PLT 336 10/27/2022 1006   MCV 94.7 10/27/2022 1006   MCH 30.3 10/27/2022 1006   MCHC 32.0 10/27/2022 1006   RDW 16.7 (H) 10/27/2022 1006   LYMPHSABS 1.0 10/27/2022 1006   MONOABS 0.4 10/27/2022 1006   EOSABS 0.1 10/27/2022 1006   BASOSABS 0.0 10/27/2022 1006    Chest Imaging: June 24, 2021: CT chest: Upper lobe pulmonary nodule on the left side, posterior lesions to the look more inflammatory in nature. This is new nodule however compared to her imaging that was done last year. The patient's images have been independently reviewed by me.    08/12/2021 super D CT chest: Interval progression of the consolidation within the right upper lobe posterior segment.  The adjacent right upper lobe lesion  in the lateral aspect was still persistent. The patient's images have been independently reviewed by me.    06/04/2022 nuclear medicine pet imaging: Partially cavitary consolidation.  Hypermetabolic on PET scan. Due to evolution and change over time more likely inflammatory instead of malignant. The patient's images have been independently reviewed by me.    Pulmonary Functions Testing Results:    Latest Ref Rng & Units 07/19/2021   10:13 AM  PFT Results  FVC-Pre L 1.48   FVC-Predicted Pre % 47   Pre FEV1/FVC % % 36   FEV1-Pre L 0.54   FEV1-Predicted Pre % 22   DLCO uncorrected ml/min/mmHg 5.56   DLCO UNC% % 28   DLCO corrected ml/min/mmHg 5.56   DLCO COR %Predicted % 28   DLVA Predicted % 47     FeNO:   Pathology:   Echocardiogram:   Heart Catheterization:     Assessment & Plan:     ICD-10-CM   1. Stage 3 severe COPD by GOLD classification  J44.9 AMB referral to pulmonary rehabilitation    Pulmonary Function Test    6 minute walk    2. Cavitary lesion of lung  J98.4     3. Centrilobular emphysema  J43.2     4. Protein-calorie  malnutrition, severe  E43     5. Chronic hypoxemic respiratory failure  J96.11       Discussion:  This is a 67 year old female, history of pulmonary nodules, history of lung cancer in the family, severe centrilobular emphysema, chronic hypoxemic respiratory failure had a bronchoscopy in December 2022 that was negative for malignancy.  She has an infiltrate that has partial cavitation in the right upper lobe follow-up CT imaging shows stability.  Cultures negative to date.  Plan: Follow-up CT imaging is stable. She needs follow-up CT in 1 year. Continue Breztri Continue albuterol as needed Continue Daliresp Work on getting the prior authorization for her [employer]. Referral to pulmonary rehabilitation.    Current Outpatient Medications:    albuterol (VENTOLIN HFA) 108 (90 Base) MCG/ACT inhaler, Inhale 2 puffs into the lungs every 6  (six) hours as needed for wheezing or shortness of breath., Disp: 18 g, Rfl: 6   alendronate (FOSAMAX) 70 MG tablet, Take 70 mg by mouth every Saturday., Disp: , Rfl:    amitriptyline (ELAVIL) 50 MG tablet, Take 50 mg by mouth at bedtime., Disp: , Rfl:    aspirin EC 81 MG tablet, Take 81 mg daily by mouth. , Disp: , Rfl:    Budeson-Glycopyrrol-Formoterol (BREZTRI AEROSPHERE) 160-9-4.8 MCG/ACT AERO, INHALE 2 PUFFS TWICE DAILY, Disp: 11 g, Rfl: 5   DALIRESP 500 MCG TABS tablet, Take 1 tablet by mouth once daily, Disp: 30 tablet, Rfl: 2   Evolocumab (REPATHA SURECLICK) 140 MG/ML SOAJ, Inject 140 mg into the skin every 14 (fourteen) days., Disp: 2 mL, Rfl: 11   gabapentin (NEURONTIN) 300 MG capsule, Take 300 mg by mouth 2 (two) times daily., Disp: , Rfl:    hydroxyurea (HYDREA) 500 MG capsule, TAKE 1 CAPSULE BY MOUTH EVERY OTHER DAY WITH FOOD TO  MINIMIZE  GI  SIDE  EFFECTS, Disp: 45 capsule, Rfl: 0   levothyroxine (SYNTHROID) 75 MCG tablet, Take 75 mcg by mouth every morning., Disp: , Rfl:    metoprolol succinate (TOPROL-XL) 25 MG 24 hr tablet, Take 1 tablet (25 mg total) by mouth daily., Disp: 90 tablet, Rfl: 3   Multiple Vitamin (MULTIVITAMIN WITH MINERALS) TABS tablet, Take 1 tablet by mouth daily., Disp: , Rfl:    OXYGEN, Inhale 2 L into the lungs continuous., Disp: , Rfl:    predniSONE (DELTASONE) 5 MG tablet, Take 1 tablet (5 mg total) by mouth daily with breakfast., Disp: 30 tablet, Rfl: 2   rosuvastatin (CRESTOR) 40 MG tablet, Take 1 tablet by mouth once daily, Disp: 90 tablet, Rfl: 3   Josephine Igo, DO [facility] 12/17/2022 9:32 AM

## 2022-12-17 NOTE — Telephone Encounter (Signed)
Patient Advocate Encounter   Received notification from [facility] that prior authorization is required for Daliresp tablets   Submitted: 12-17-2022 Key [id number]  Status is pending

## 2022-12-19 ENCOUNTER — Encounter (HOSPITAL_COMMUNITY): Payer: Self-pay

## 2022-12-19 ENCOUNTER — Telehealth: Payer: Self-pay

## 2022-12-19 ENCOUNTER — Other Ambulatory Visit (HOSPITAL_COMMUNITY): Payer: Self-pay

## 2022-12-19 ENCOUNTER — Other Ambulatory Visit: Payer: Self-pay

## 2022-12-19 MED ORDER — ROFLUMILAST 500 MCG PO TABS: 500.0000 ug | ORAL_TABLET | Freq: Every day | ORAL | 11 refills | Status: DC

## 2022-12-19 NOTE — Telephone Encounter (Signed)
Called pt and left detailed message about pt's DALIRESP 500 MCG not being cover by her ins as per prior auth teams findings. But the generic brand roflumilast (DALIRESP) 500 MCG is covered under the pt's ins plan. So I sent roflumilast (DALIRESP) 500 MCG into pt's pharmacy. Nothing further needed.

## 2022-12-19 NOTE — Progress Notes (Signed)
Per test claim generic is preferred.

## 2022-12-25 ENCOUNTER — Other Ambulatory Visit (HOSPITAL_COMMUNITY): Payer: Self-pay

## 2022-12-25 NOTE — Telephone Encounter (Signed)
Patient Advocate Encounter  Received a fax from [facility] regarding Prior Authorization for Daliresp tablets.   Authorization has been DENIED due to the generic, Roflumilast, is on preferred list.

## 2023-01-06 ENCOUNTER — Encounter: Payer: Self-pay | Admitting: Gastroenterology

## 2023-01-15 ENCOUNTER — Telehealth (HOSPITAL_COMMUNITY): Payer: Self-pay

## 2023-01-15 ENCOUNTER — Encounter (HOSPITAL_COMMUNITY): Payer: Self-pay

## 2023-01-15 NOTE — Telephone Encounter (Signed)
Called patient to see if she was interested in participating in the Pulmonary Rehab Program. Patient stated yes. Patient will come in for orientation on 01/28/23@1030  and will attend the 115 exercise class.   Sent package.

## 2023-01-23 ENCOUNTER — Encounter: Payer: Self-pay | Admitting: Family

## 2023-01-23 ENCOUNTER — Inpatient Hospital Stay: Payer: Medicare Other | Attending: Hematology & Oncology

## 2023-01-23 ENCOUNTER — Inpatient Hospital Stay (HOSPITAL_BASED_OUTPATIENT_CLINIC_OR_DEPARTMENT_OTHER): Payer: Medicare Other | Admitting: Family

## 2023-01-23 VITALS — BP 130/55 | HR 94 | Temp 98.3°F | Resp 20 | Ht 64.0 in | Wt 104.1 lb

## 2023-01-23 DIAGNOSIS — D509 Iron deficiency anemia, unspecified: Secondary | ICD-10-CM | POA: Diagnosis not present

## 2023-01-23 DIAGNOSIS — D473 Essential (hemorrhagic) thrombocythemia: Secondary | ICD-10-CM

## 2023-01-23 LAB — CBC WITH DIFFERENTIAL (CANCER CENTER ONLY)
Abs Immature Granulocytes: 0.03 10*3/uL (ref 0.00–0.07)
Basophils Absolute: 0 10*3/uL (ref 0.0–0.1)
Basophils Relative: 1 %
Eosinophils Absolute: 0 10*3/uL (ref 0.0–0.5)
Eosinophils Relative: 1 %
HCT: 39.3 % (ref 36.0–46.0)
Hemoglobin: 12.6 g/dL (ref 12.0–15.0)
Immature Granulocytes: 1 %
Lymphocytes Relative: 20 %
Lymphs Abs: 1.3 10*3/uL (ref 0.7–4.0)
MCH: 30.7 pg (ref 26.0–34.0)
MCHC: 32.1 g/dL (ref 30.0–36.0)
MCV: 95.9 fL (ref 80.0–100.0)
Monocytes Absolute: 0.6 10*3/uL (ref 0.1–1.0)
Monocytes Relative: 9 %
Neutro Abs: 4.4 10*3/uL (ref 1.7–7.7)
Neutrophils Relative %: 68 %
Platelet Count: 269 10*3/uL (ref 150–400)
RBC: 4.1 MIL/uL (ref 3.87–5.11)
RDW: 16 % — ABNORMAL HIGH (ref 11.5–15.5)
WBC Count: 6.3 10*3/uL (ref 4.0–10.5)
nRBC: 0 % (ref 0.0–0.2)

## 2023-01-23 LAB — CMP (CANCER CENTER ONLY)
ALT: 15 U/L (ref 0–44)
AST: 18 U/L (ref 15–41)
Albumin: 4.3 g/dL (ref 3.5–5.0)
Alkaline Phosphatase: 57 U/L (ref 38–126)
Anion gap: 5 (ref 5–15)
BUN: 11 mg/dL (ref 8–23)
CO2: 34 mmol/L — ABNORMAL HIGH (ref 22–32)
Calcium: 9.8 mg/dL (ref 8.9–10.3)
Chloride: 100 mmol/L (ref 98–111)
Creatinine: 0.78 mg/dL (ref 0.44–1.00)
GFR, Estimated: >60 mL/min
Glucose, Bld: 103 mg/dL — ABNORMAL HIGH (ref 70–99)
Potassium: 4.1 mmol/L (ref 3.5–5.1)
Sodium: 139 mmol/L (ref 135–145)
Total Bilirubin: 0.6 mg/dL (ref 0.3–1.2)
Total Protein: 6.8 g/dL (ref 6.5–8.1)

## 2023-01-23 LAB — IRON AND IRON BINDING CAPACITY (CC-WL,HP ONLY)
Iron: 59 ug/dL (ref 28–170)
Saturation Ratios: 17 % (ref 10.4–31.8)
TIBC: 339 ug/dL (ref 250–450)
UIBC: 280 ug/dL (ref 148–442)

## 2023-01-23 LAB — LACTATE DEHYDROGENASE: LDH: 164 U/L (ref 98–192)

## 2023-01-23 LAB — FERRITIN: Ferritin: 35 ng/mL (ref 11–307)

## 2023-01-23 NOTE — Progress Notes (Signed)
Hematology and Oncology Follow Up Visit  Cheryl Haley [id number] [birth year] 67 y.o. 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 She is feeling ok but states that she has had dark and red blood in her stool for over a month now. She has seen her PCP and been referred to GI. She will see them in July.  No other blood loss noted. She bruises easily on aspirin but not in excess.  She denies fatigue or worsening of SOB. She uses 3L supplemental O2 when up and about and then drops to 2L when sedentary at home.  No fever, chills, n/v, cough, rash, dizziness, chest pain, palpitations, abdominal pain or changes in bowel or bladder habits.  No swelling, tenderness, numbness or tingling in her extremities at this time.  No falls or syncope reported. She uses her Rolator for added support.  Appetite and hydration have been good. Her weight is stable at 104 lbs.   ECOG Performance Status: 1 - Symptomatic but completely ambulatory  Medications:  Allergies as of 01/23/2023       Reactions   Bisoprolol Fumarate Other (See Comments)   Other reaction(s): heart issues        Medication List        Accurate as of Jan 23, 2023 10:11 AM. If you have any questions, ask your nurse or doctor.          albuterol 108 (90 Base) MCG/ACT inhaler Commonly known as: VENTOLIN HFA Inhale 2 puffs into the lungs every 6 (six) hours as needed for wheezing or shortness of breath.   alendronate 70 MG tablet Commonly known as: FOSAMAX Take 70 mg by mouth every Saturday.   amitriptyline 50 MG tablet Commonly known as: ELAVIL Take 50 mg by mouth at bedtime.   aspirin EC 81 MG tablet Take 81 mg daily by mouth.   Breztri Aerosphere 160-9-4.8 MCG/ACT Aero Generic drug:  Budeson-Glycopyrrol-Formoterol INHALE 2 PUFFS TWICE DAILY   Daliresp 500 MCG Tabs tablet Generic drug: roflumilast Take 1 tablet by mouth once daily   roflumilast 500 MCG Tabs tablet Commonly known as: DALIRESP Take 1 tablet (500 mcg total) by mouth daily.   gabapentin 300 MG capsule Commonly known as: NEURONTIN Take 300 mg by mouth 2 (two) times daily.   hydroxyurea 500 MG capsule Commonly known as: HYDREA TAKE 1 CAPSULE BY MOUTH EVERY OTHER DAY WITH FOOD TO  MINIMIZE  GI  SIDE  EFFECTS   levothyroxine 75 MCG tablet Commonly known as: SYNTHROID Take 75 mcg by mouth every morning.   metoprolol succinate 25 MG 24 hr tablet Commonly known as: TOPROL-XL Take 1 tablet (25 mg total) by mouth daily.   multivitamin with minerals Tabs tablet Take 1 tablet by mouth daily.   OXYGEN Inhale 2 L into the lungs continuous.   predniSONE 5 MG tablet Commonly known as: DELTASONE Take 1 tablet (5 mg total) by mouth daily with breakfast.   Repatha SureClick 140 MG/ML Soaj Generic drug: Evolocumab Inject 140 mg into the skin every 14 (fourteen) days.   rosuvastatin 40 MG tablet Commonly known as: CRESTOR Take 1 tablet by mouth once daily        Allergies:  Allergies  Allergen Reactions   Bisoprolol Fumarate Other (See Comments)    Other reaction(s): heart issues  Past Medical History, Surgical history, Social history, and Family History were reviewed and updated.  Review of Systems: All other 10 point review of systems is negative.   Physical Exam:  height is 5\' 4"  (1.626 m) and weight is 104 lb 1.9 oz (47.2 kg). Her oral temperature is 98.3 F (36.8 C). Her blood pressure is 130/55 (abnormal) and her pulse is 94. Her respiration is 20 and oxygen saturation is 97%.   Wt Readings from Last 3 Encounters:  01/23/23 104 lb 1.9 oz (47.2 kg)  12/17/22 105 lb (47.6 kg)  09/24/22 99 lb (44.9 kg)    Ocular: Sclerae unicteric, pupils equal, round and reactive to  light Ear-nose-throat: Oropharynx clear, dentition fair Lymphatic: No cervical or supraclavicular adenopathy Lungs no rales or rhonchi, good excursion bilaterally Heart regular rate and rhythm, no murmur appreciated Abd soft, nontender, positive bowel sounds MSK no focal spinal tenderness, no joint edema Neuro: non-focal, well-oriented, appropriate affect Breasts: Deferred   Lab Results  Component Value Date   WBC 6.3 01/23/2023   HGB 12.6 01/23/2023   HCT 39.3 01/23/2023   MCV 95.9 01/23/2023   PLT 269 01/23/2023   Lab Results  Component Value Date   FERRITIN 21 10/27/2022   IRON 82 10/27/2022   TIBC 378 10/27/2022   UIBC 296 10/27/2022   IRONPCTSAT 22 10/27/2022   Lab Results  Component Value Date   RBC 4.10 01/23/2023   No results found for: "KPAFRELGTCHN", "LAMBDASER", "KAPLAMBRATIO" No results found for: "IGGSERUM", "IGA", "IGMSERUM" Lab Results  Component Value Date   TOTALPROTELP 6.8 09/23/2018   ALBUMINELP 3.9 09/23/2018   A1GS 0.1 09/23/2018   A2GS 0.9 09/23/2018   BETS 0.8 09/23/2018   GAMS 1.0 09/23/2018   MSPIKE Not Observed 09/23/2018   SPEI Comment 09/23/2018     Chemistry      Component Value Date/Time   NA 139 01/23/2023 0928   K 4.1 01/23/2023 0928   CL 100 01/23/2023 0928   CO2 34 (H) 01/23/2023 0928   BUN 11 01/23/2023 0928   CREATININE 0.78 01/23/2023 0928      Component Value Date/Time   CALCIUM 9.8 01/23/2023 0928   ALKPHOS 57 01/23/2023 0928   AST 18 01/23/2023 0928   ALT 15 01/23/2023 0928   BILITOT 0.6 01/23/2023 0928       Impression and Plan: Cheryl Haley is a very pleasant 67 yo female with essential thrombocythemia, JAK2 V617F+; high risk by IPSET.  She is doing well and her counts remain stable.  She will continue taking her Hydrea every other day.  Iron studies are pending.  Follow-up in 4 months.   Eileen Stanford, NP 5/24/202410:11 AM

## 2023-01-26 ENCOUNTER — Emergency Department (HOSPITAL_BASED_OUTPATIENT_CLINIC_OR_DEPARTMENT_OTHER): Payer: Medicare Other

## 2023-01-26 ENCOUNTER — Emergency Department (HOSPITAL_BASED_OUTPATIENT_CLINIC_OR_DEPARTMENT_OTHER)
Admission: EM | Admit: 2023-01-26 | Discharge: 2023-01-26 | Disposition: A | Discharge status: Home / Self Care | Payer: Medicare Other | Attending: Emergency Medicine | Admitting: Emergency Medicine

## 2023-01-26 ENCOUNTER — Encounter (HOSPITAL_BASED_OUTPATIENT_CLINIC_OR_DEPARTMENT_OTHER): Payer: Self-pay

## 2023-01-26 ENCOUNTER — Other Ambulatory Visit: Payer: Self-pay

## 2023-01-26 DIAGNOSIS — J189 Pneumonia, unspecified organism: Secondary | ICD-10-CM | POA: Insufficient documentation

## 2023-01-26 DIAGNOSIS — Z20822 Contact with and (suspected) exposure to covid-19: Secondary | ICD-10-CM | POA: Insufficient documentation

## 2023-01-26 DIAGNOSIS — F17210 Nicotine dependence, cigarettes, uncomplicated: Secondary | ICD-10-CM | POA: Diagnosis not present

## 2023-01-26 DIAGNOSIS — I10 Essential (primary) hypertension: Secondary | ICD-10-CM | POA: Insufficient documentation

## 2023-01-26 DIAGNOSIS — Z79899 Other long term (current) drug therapy: Secondary | ICD-10-CM | POA: Insufficient documentation

## 2023-01-26 DIAGNOSIS — J441 Chronic obstructive pulmonary disease with (acute) exacerbation: Secondary | ICD-10-CM | POA: Insufficient documentation

## 2023-01-26 DIAGNOSIS — E039 Hypothyroidism, unspecified: Secondary | ICD-10-CM | POA: Diagnosis not present

## 2023-01-26 DIAGNOSIS — Z7982 Long term (current) use of aspirin: Secondary | ICD-10-CM | POA: Diagnosis not present

## 2023-01-26 DIAGNOSIS — R0603 Acute respiratory distress: Secondary | ICD-10-CM | POA: Diagnosis present

## 2023-01-26 LAB — CBC WITH DIFFERENTIAL/PLATELET
Abs Immature Granulocytes: 0.03 10*3/uL (ref 0.00–0.07)
Basophils Absolute: 0 10*3/uL (ref 0.0–0.1)
Basophils Relative: 0 %
Eosinophils Absolute: 0 10*3/uL (ref 0.0–0.5)
Eosinophils Relative: 1 %
HCT: 39.2 % (ref 36.0–46.0)
Hemoglobin: 12.8 g/dL (ref 12.0–15.0)
Immature Granulocytes: 1 %
Lymphocytes Relative: 11 %
Lymphs Abs: 0.7 10*3/uL (ref 0.7–4.0)
MCH: 30.6 pg (ref 26.0–34.0)
MCHC: 32.7 g/dL (ref 30.0–36.0)
MCV: 93.8 fL (ref 80.0–100.0)
Monocytes Absolute: 0.4 10*3/uL (ref 0.1–1.0)
Monocytes Relative: 7 %
Neutro Abs: 5.1 10*3/uL (ref 1.7–7.7)
Neutrophils Relative %: 80 %
Platelets: 290 10*3/uL (ref 150–400)
RBC: 4.18 MIL/uL (ref 3.87–5.11)
RDW: 15.7 % — ABNORMAL HIGH (ref 11.5–15.5)
WBC: 6.3 10*3/uL (ref 4.0–10.5)
nRBC: 0 % (ref 0.0–0.2)

## 2023-01-26 LAB — COMPREHENSIVE METABOLIC PANEL
ALT: 16 U/L (ref 0–44)
AST: 27 U/L (ref 15–41)
Albumin: 3.7 g/dL (ref 3.5–5.0)
Alkaline Phosphatase: 58 U/L (ref 38–126)
Anion gap: 13 (ref 5–15)
BUN: 18 mg/dL (ref 8–23)
CO2: 27 mmol/L (ref 22–32)
Calcium: 9.1 mg/dL (ref 8.9–10.3)
Chloride: 94 mmol/L — ABNORMAL LOW (ref 98–111)
Creatinine, Ser: 0.94 mg/dL (ref 0.44–1.00)
GFR, Estimated: >60 mL/min (ref >60)
Glucose, Bld: 89 mg/dL (ref 70–99)
Potassium: 3.5 mmol/L (ref 3.5–5.1)
Sodium: 134 mmol/L — ABNORMAL LOW (ref 135–145)
Total Bilirubin: 0.8 mg/dL (ref 0.3–1.2)
Total Protein: 7.8 g/dL (ref 6.5–8.1)

## 2023-01-26 LAB — TROPONIN I (HIGH SENSITIVITY)
Troponin I (High Sensitivity): 4 ng/L (ref <18)
Troponin I (High Sensitivity): 2 ng/L (ref <18)

## 2023-01-26 LAB — RESP PANEL BY RT-PCR (RSV, FLU A&B, COVID)  RVPGX2
Influenza A by PCR: NEGATIVE
Influenza B by PCR: NEGATIVE
Resp Syncytial Virus by PCR: NEGATIVE
SARS Coronavirus 2 by RT PCR: NEGATIVE

## 2023-01-26 LAB — BRAIN NATRIURETIC PEPTIDE: B Natriuretic Peptide: 28.1 pg/mL (ref 0.0–100.0)

## 2023-01-26 MED ORDER — IPRATROPIUM-ALBUTEROL 0.5-2.5 (3) MG/3ML IN SOLN
3.0000 mL | Freq: Once | RESPIRATORY_TRACT | Status: AC
  Administered 2023-01-26: 3 mL via RESPIRATORY_TRACT
  Filled 2023-01-26: qty 3

## 2023-01-26 MED ORDER — AZITHROMYCIN 250 MG PO TABS: 250.0000 mg | ORAL_TABLET | Freq: Every day | ORAL | 0 refills | Status: DC

## 2023-01-26 MED ORDER — AMOXICILLIN-POT CLAVULANATE 875-125 MG PO TABS: 1.0000 | ORAL_TABLET | Freq: Two times a day (BID) | ORAL | 0 refills | Status: AC

## 2023-01-26 MED ORDER — AMOXICILLIN-POT CLAVULANATE 875-125 MG PO TABS
1.0000 | ORAL_TABLET | Freq: Once | ORAL | Status: AC
  Administered 2023-01-26: 1 via ORAL
  Filled 2023-01-26: qty 1

## 2023-01-26 MED ORDER — SODIUM CHLORIDE 0.9 % IV SOLN
500.0000 mg | Freq: Once | INTRAVENOUS | Status: DC
  Filled 2023-01-26: qty 5

## 2023-01-26 MED ORDER — ALBUTEROL SULFATE HFA 108 (90 BASE) MCG/ACT IN AERS: 2.0000 | INHALATION_SPRAY | RESPIRATORY_TRACT | Status: DC | PRN

## 2023-01-26 MED ORDER — AZITHROMYCIN 250 MG PO TABS
500.0000 mg | ORAL_TABLET | Freq: Once | ORAL | Status: AC
  Administered 2023-01-26: 500 mg via ORAL
  Filled 2023-01-26: qty 2

## 2023-01-26 MED ORDER — METHYLPREDNISOLONE SODIUM SUCC 125 MG IJ SOLR
125.0000 mg | Freq: Once | INTRAMUSCULAR | Status: AC
  Administered 2023-01-26: 125 mg via INTRAVENOUS
  Filled 2023-01-26: qty 2

## 2023-01-26 MED ORDER — PREDNISONE 10 MG PO TABS: 40.0000 mg | ORAL_TABLET | Freq: Every day | ORAL | 0 refills | Status: AC

## 2023-01-26 MED ORDER — AZITHROMYCIN 250 MG PO TABS: 500.0000 mg | ORAL_TABLET | Freq: Once | ORAL | Status: DC

## 2023-01-26 MED ORDER — IOHEXOL 350 MG/ML SOLN
100.0000 mL | Freq: Once | INTRAVENOUS | Status: AC | PRN
  Administered 2023-01-26: 100 mL via INTRAVENOUS

## 2023-01-26 NOTE — ED Triage Notes (Signed)
Patient has had a cough and increased shortness of breath since yesterday. She is normally on 2lpm of o2 at home. Fever of 100.4 yesterday.

## 2023-01-26 NOTE — ED Provider Notes (Signed)
[facility] Provider Note   CSN: [id number] Arrival date & time: 01/26/23  4098     History  Chief Complaint  Patient presents with   Respiratory Distress    Cheryl Haley is a 67 y.o. female.  HPI    67 year old female with a history of COPD on 2L of oxygen, 3L with ambulation/portable,  lung nodules, cavitary right upper lobe lesion, hypertension, hyperlipidemia, pleural effusion, thyroid disease, essential thrombocythemia who presents with concern for shortness of breath, cough, fever.   Reports some URI symptoms over last week, yesterday began to have worsening cough, shortness of breath particularly on exertion, and fever at home.  Fever 100.4.  No leg pain or swelling.  Not having significant wheezing.  No nausea, vomiting, diarrhea. NO chest pain.  Dyspnea worse with attempting to walk.   Past Medical History:  Diagnosis Date   Agatston coronary artery calcium score greater than 400    score 678   Anxiety    Arthritis    "in neck"   COPD (chronic obstructive pulmonary disease) (HCC)    Dyspnea    Emphysema lung (HCC)    Essential thrombocythemia (HCC) 09/15/2019   History of hiatal hernia    Hx of TIA (transient ischemic attack) and stroke    Hyperlipidemia    Hypertension    Hypothyroidism    Impaired fasting glucose    Pleural effusion 05/15/2014   CXR 05/2014:  New small right effusion.     Pneumonia    Protein-calorie malnutrition, severe 09/17/2018   Pulmonary nodules 07/14/2018   07/2017 CT chest >pulmonary nodules noted.  07/2017 PET scan -Hypermetabolic LUL nodule  12/2017 CT chest >decreased nodule size, new pulmonary nodules noted.  FOB -atypical cells  CT chest 07/2018 >stable nodules to smaller on established nodules . 2 new nodules RUL , RLL >CT chest 3 months    Thyroid disease    Tobacco abuse 11/28/2015   Smoked 2 packs of cigarettes daily from teenage years until age 9, quit in 2016    Unspecified  hypothyroidism 09/20/2013   Viral respiratory infection 09/16/2018     Home Medications Prior to Admission medications   Medication Sig Start Date End Date Taking? Authorizing Provider  amoxicillin-clavulanate (AUGMENTIN) 875-125 MG tablet Take 1 tablet by mouth every 12 (twelve) hours for 7 days. 01/26/23 02/02/23 Yes Alvira Monday, MD  azithromycin (ZITHROMAX) 250 MG tablet Take 1 tablet (250 mg total) by mouth daily. Take first 2 tablets together, then 1 every day until finished. 01/26/23  Yes Alvira Monday, MD  predniSONE (DELTASONE) 10 MG tablet Take 4 tablets (40 mg total) by mouth daily for 4 days. 01/26/23 01/30/23 Yes Alvira Monday, MD  albuterol (VENTOLIN HFA) 108 (90 Base) MCG/ACT inhaler Inhale 2 puffs into the lungs every 6 (six) hours as needed for wheezing or shortness of breath. 04/14/22   Josephine Igo, DO  alendronate (FOSAMAX) 70 MG tablet Take 70 mg by mouth every Saturday. 06/13/21   [provider]  amitriptyline (ELAVIL) 50 MG tablet Take 50 mg by mouth at bedtime.    [provider]  aspirin EC 81 MG tablet Take 81 mg daily by mouth.     [provider]  Budeson-Glycopyrrol-Formoterol (BREZTRI AEROSPHERE) 160-9-4.8 MCG/ACT AERO INHALE 2 PUFFS TWICE DAILY 12/09/22   Icard, Rachel Bo, DO  DALIRESP 500 MCG TABS tablet Take 1 tablet by mouth once daily 11/10/22   Kalman Shan, MD  Evolocumab [facility] SURECLICK)  140 MG/ML SOAJ Inject 140 mg into the skin every 14 (fourteen) days. 09/02/22   Quintella Reichert, MD  gabapentin (NEURONTIN) 300 MG capsule Take 300 mg by mouth 2 (two) times daily.    [provider]  hydroxyurea (HYDREA) 500 MG capsule TAKE 1 CAPSULE BY MOUTH EVERY OTHER DAY WITH FOOD TO  MINIMIZE  GI  SIDE  EFFECTS 12/08/22   Josph Macho, MD  levothyroxine (SYNTHROID) 75 MCG tablet Take 75 mcg by mouth every morning. 04/24/21   [provider]  metoprolol succinate (TOPROL-XL) 25 MG 24 hr tablet Take 1 tablet (25 mg  total) by mouth daily. 09/15/22   Quintella Reichert, MD  Multiple Vitamin (MULTIVITAMIN WITH MINERALS) TABS tablet Take 1 tablet by mouth daily.    [provider]  OXYGEN Inhale 2 L into the lungs continuous.    [provider]  predniSONE (DELTASONE) 5 MG tablet Take 1 tablet (5 mg total) by mouth daily with breakfast. 10/27/22   Icard, Rachel Bo, DO  roflumilast (DALIRESP) 500 MCG TABS tablet Take 1 tablet (500 mcg total) by mouth daily. 12/19/22   Kalman Shan, MD  rosuvastatin (CRESTOR) 40 MG tablet Take 1 tablet by mouth once daily 08/04/22   Quintella Reichert, MD      Allergies    Bisoprolol fumarate    Review of Systems   Review of Systems  Physical Exam Updated Vital Signs BP (!) 114/57   Pulse 100   Temp 97.8 F (36.6 C) (Oral)   Resp (!) 21   Ht 5\' 4"  (1.626 m)   Wt 46.7 kg   SpO2 96%   BMI 17.68 kg/m  Physical Exam Vitals and nursing note reviewed.  Constitutional:      General: She is not in acute distress.    Appearance: She is well-developed. She is not diaphoretic.  HENT:     Head: Normocephalic and atraumatic.  Eyes:     Conjunctiva/sclera: Conjunctivae normal.  Cardiovascular:     Rate and Rhythm: Normal rate and regular rhythm.     Heart sounds: Normal heart sounds. No murmur heard.    No friction rub. No gallop.  Pulmonary:     Effort: Pulmonary effort is normal. No respiratory distress.     Breath sounds: Wheezing present. No rales.  Abdominal:     General: There is no distension.     Palpations: Abdomen is soft.     Tenderness: There is no abdominal tenderness. There is no guarding.  Musculoskeletal:        General: No tenderness.     Cervical back: Normal range of motion.  Skin:    General: Skin is warm and dry.     Findings: No erythema or rash.  Neurological:     Mental Status: She is alert and oriented to person, place, and time.     ED Results / Procedures / Treatments   Labs (all labs ordered are listed, but only  abnormal results are displayed) Labs Reviewed  CBC WITH DIFFERENTIAL/PLATELET - Abnormal; Notable for the following components:      Result Value   RDW 15.7 (*)    All other components within normal limits  COMPREHENSIVE METABOLIC PANEL - Abnormal; Notable for the following components:   Sodium 134 (*)    Chloride 94 (*)    All other components within normal limits  RESP PANEL BY RT-PCR (RSV, FLU A&B, COVID)  [id number]  BRAIN NATRIURETIC PEPTIDE  TROPONIN I (HIGH  SENSITIVITY)  TROPONIN I (HIGH SENSITIVITY)    EKG EKG Interpretation  Date/Time:  Monday Jan 26 2023 08:30:59 EDT Ventricular Rate:  107 PR Interval:  166 QRS Duration: 105 QT Interval:  339 QTC Calculation: 453 R Axis:   91 Text Interpretation: Sinus tachycardia Paired ventricular premature complexes Biatrial enlargement Right axis deviation Since prior ECG, PVC new, baseline wander, no significant changes compared to prior Confirmed by Alvira Monday ([id number]) on 01/26/2023 8:32:42 AM  Radiology CT Angio Chest PE W and/or Wo Contrast  Result Date: 01/26/2023 CLINICAL DATA:  Cough and shortness of breath since yesterday. Low-grade fever. EXAM: CT ANGIOGRAPHY CHEST WITH CONTRAST TECHNIQUE: Multidetector CT imaging of the chest was performed using the standard protocol during bolus administration of intravenous contrast. Multiplanar CT image reconstructions and MIPs were obtained to evaluate the vascular anatomy. RADIATION DOSE REDUCTION: This exam was performed according to the departmental dose-optimization program which includes automated exposure control, adjustment of the mA and/or kV according to patient size and/or use of iterative reconstruction technique. CONTRAST:  OMNIPAQUE IOHEXOL 350 MG/ML SOLN COMPARISON:  Chest x-ray from same day. CT chest dated December 05, 2022. FINDINGS: Cardiovascular: Satisfactory opacification of the pulmonary arteries to the segmental level. No evidence of pulmonary embolism. Normal heart  size. Unchanged trace pericardial effusion. No thoracic aortic aneurysm or dissection. Coronary, aortic arch, and branch vessel atherosclerotic vascular disease. Mediastinum/Nodes: No enlarged mediastinal, hilar, or axillary lymph nodes. Thyroid gland, trachea, and esophagus demonstrate no significant findings. Lungs/Pleura: Severe centrilobular emphysema again noted. Increased interstitial thickening and patchy nodular consolidation in the right lung apex. Unchanged scarring around the fiducial marker in the peripheral right upper lobe. No pleural effusion or pneumothorax. Small solid pulmonary nodules seen on the prior CT have resolved. No new pulmonary nodule. Upper Abdomen: No acute abnormality. Musculoskeletal: No chest wall abnormality. No acute or significant osseous findings. Review of the MIP images confirms the above findings. IMPRESSION: 1. No evidence of pulmonary embolism. 2. Increased interstitial thickening and patchy nodular consolidation in the right lung apex, concerning for recurrent pneumonia. 3. Small solid pulmonary nodules seen on the prior CT have resolved. No new pulmonary nodule. 4. Aortic Atherosclerosis (ICD10-I70.0) and Emphysema (ICD10-J43.9). Electronically Signed   By: Obie Dredge M.D.   On: 01/26/2023 10:58   DG Chest Portable 1 View  Result Date: 01/26/2023 CLINICAL DATA:  Increasing cough and shortness of breath since yesterday. Low-grade fever. History of right upper lobe pneumonia. EXAM: PORTABLE CHEST 1 VIEW COMPARISON:  CT chest dated December 05, 2022. Chest x-ray dated February 10, 2022. FINDINGS: The heart size and mediastinal contours are within normal limits. Normal pulmonary vascularity. The lungs remain hyperinflated with coarse interstitial markings. Irregular scarring in the right upper lobe is not significantly changed compared to recent chest CT. Unchanged fiducial marker in the right upper lobe. No focal consolidation, pleural effusion, or pneumothorax. No acute  osseous abnormality. IMPRESSION: 1. No active disease. 2. COPD.  Stable post-infectious scarring in the right upper lobe. Electronically Signed   By: Obie Dredge M.D.   On: 01/26/2023 09:27    Procedures Procedures    Medications Ordered in ED Medications  iohexol (OMNIPAQUE) 350 MG/ML injection 100 mL (100 mLs Intravenous Contrast Given 01/26/23 0956)  methylPREDNISolone sodium succinate (SOLU-MEDROL) 125 mg/2 mL injection 125 mg (125 mg Intravenous Given 01/26/23 1104)  ipratropium-albuterol (DUONEB) 0.5-2.5 (3) MG/3ML nebulizer solution 3 mL (3 mLs Nebulization Given 01/26/23 1121)  amoxicillin-clavulanate (AUGMENTIN) 875-125 MG per tablet 1 tablet (1  tablet Oral Given 01/26/23 1209)  azithromycin [facility]) tablet 500 mg (500 mg Oral Given 01/26/23 1209)    ED Course/ Medical Decision Making/ A&P                              67 year old female with a history of COPD on 2L of oxygen, 3L with ambulation/portable,  lung nodules, cavitary right upper lobe lesion, hypertension, hyperlipidemia, pleural effusion, thyroid disease, essential thrombocythemia who presents with concern for shortness of breath, cough, fever.   Differential diagnosis for dyspnea includes ACS, PE, COPD exacerbation, CHF exacerbation, anemia, pneumonia, viral etiology such as COVID 19 infection, metabolic abnormality.    EKG was evaluated by me which showed no significant changes compared to prior.  Chest x-ray was done and evaluated by me which showed no acute abnormalities.     Given dyspnea, cough with low grade fever, CT PE study ordered to evaluate for PE or occult pneumonia.  CT shows no PE, does show findings concerning for recurrent pneumonia.  Labs completed and personally evaluated by me shows no RSV/COVID/flu, troponins negative, doubt ACS< BNP normal doubt CHF, no leukocytosis, anemia, or clinically significant electrolyte abnormalities.  Given solumedrol and duoneb for COPD exacerbation, oral  augmentin and azithromycin for CAP.  Normal oxygenation, no respiratory distress, no signs of sepsis, feel she is stable for outpatient therapy.  Given rx for augmentin, azithromycin and prednisone. Patient discharged in stable condition with understanding of reasons to return.         Final Clinical Impression(s) / ED Diagnoses Final diagnoses:  COPD exacerbation (HCC)  Community acquired pneumonia of right upper lobe of lung    Rx / DC Orders ED Discharge Orders          Ordered    amoxicillin-clavulanate (AUGMENTIN) 875-125 MG tablet  Every 12 hours        01/26/23 1130    azithromycin (ZITHROMAX) 250 MG tablet  Daily        01/26/23 1130    predniSONE (DELTASONE) 10 MG tablet  Daily        01/26/23 1130              Alvira Monday, MD 01/26/23 1726

## 2023-01-28 ENCOUNTER — Ambulatory Visit (HOSPITAL_COMMUNITY): Payer: Medicare Other

## 2023-01-30 ENCOUNTER — Other Ambulatory Visit: Payer: Self-pay | Admitting: Pulmonary Disease

## 2023-02-03 ENCOUNTER — Ambulatory Visit (HOSPITAL_COMMUNITY): Payer: Medicare Other

## 2023-02-05 ENCOUNTER — Other Ambulatory Visit: Payer: Self-pay | Admitting: Pulmonary Disease

## 2023-02-05 ENCOUNTER — Ambulatory Visit (HOSPITAL_COMMUNITY): Payer: Medicare Other

## 2023-02-05 NOTE — Telephone Encounter (Signed)
Needs appt

## 2023-02-06 ENCOUNTER — Ambulatory Visit (INDEPENDENT_AMBULATORY_CARE_PROVIDER_SITE_OTHER): Payer: Medicare Other | Admitting: Pulmonary Disease

## 2023-02-06 DIAGNOSIS — J449 Chronic obstructive pulmonary disease, unspecified: Secondary | ICD-10-CM

## 2023-02-06 NOTE — Progress Notes (Unsigned)
Six Minute Walk - 02/06/23 1008       Six Minute Walk   Medications taken before test (dose and time) Breztri, gabapentin 300mg , levothyroxine , metoprolol 25mg     Supplemental oxygen during test? Yes    O2 Flow Rate (L/min) 3 L/min    Type Pulse    Lap distance in meters  34 meters    Laps Completed 5    Partial lap (in meters) 26 meters    Baseline BP (sitting) 108/64    Baseline Heartrate 92    Baseline Dyspnea (Borg Scale) 4    Baseline Fatigue (Borg Scale) 0    Baseline SPO2 98 %      End of Test Values    BP (sitting) 110/70    Heartrate 106    Dyspnea (Borg Scale) 4    Fatigue (Borg Scale) 3    SPO2 95 %      2 Minutes Post Walk Values   BP (sitting) 108/72    Heartrate 100    SPO2 96 %    Stopped or paused before six minutes? No      Interpretation   Distance completed 196 meters    Tech Comments: Patient was able to complete 6wm without any stops. She did walk with her rollator and on 3L of O2 pulsed. Patient stated that she was recently diagnosed with PNA but was feeling much better and wanted to attempt the walk. She did feel slightly winded at the end of the walk. She maintained on 3L of O2 pulsed. Denied any chest or leg pain nor any wheezing.

## 2023-02-10 ENCOUNTER — Ambulatory Visit (HOSPITAL_COMMUNITY): Payer: Medicare Other

## 2023-02-10 ENCOUNTER — Other Ambulatory Visit: Payer: Self-pay | Admitting: Pulmonary Disease

## 2023-02-12 ENCOUNTER — Ambulatory Visit (HOSPITAL_COMMUNITY): Payer: Medicare Other

## 2023-02-16 ENCOUNTER — Encounter: Payer: Self-pay | Admitting: Adult Health

## 2023-02-16 ENCOUNTER — Ambulatory Visit (INDEPENDENT_AMBULATORY_CARE_PROVIDER_SITE_OTHER): Payer: Medicare Other | Admitting: Pulmonary Disease

## 2023-02-16 ENCOUNTER — Ambulatory Visit (INDEPENDENT_AMBULATORY_CARE_PROVIDER_SITE_OTHER): Payer: Medicare Other | Admitting: Adult Health

## 2023-02-16 VITALS — BP 110/50 | HR 100 | Temp 98.3°F | Ht 64.0 in | Wt 102.2 lb

## 2023-02-16 DIAGNOSIS — R918 Other nonspecific abnormal finding of lung field: Secondary | ICD-10-CM

## 2023-02-16 DIAGNOSIS — J9611 Chronic respiratory failure with hypoxia: Secondary | ICD-10-CM | POA: Diagnosis not present

## 2023-02-16 DIAGNOSIS — J449 Chronic obstructive pulmonary disease, unspecified: Secondary | ICD-10-CM | POA: Diagnosis not present

## 2023-02-16 DIAGNOSIS — J441 Chronic obstructive pulmonary disease with (acute) exacerbation: Secondary | ICD-10-CM

## 2023-02-16 DIAGNOSIS — E43 Unspecified severe protein-calorie malnutrition: Secondary | ICD-10-CM

## 2023-02-16 DIAGNOSIS — J189 Pneumonia, unspecified organism: Secondary | ICD-10-CM

## 2023-02-16 LAB — PULMONARY FUNCTION TEST
DL/VA % pred: 46 %
DL/VA: 1.95 ml/min/mmHg/L
DLCO cor % pred: 29 %
DLCO cor: 5.7 ml/min/mmHg
DLCO unc % pred: 28 %
DLCO unc: 5.59 ml/min/mmHg
FEF 25-75 Post: 0.24 L/sec
FEF 25-75 Pre: 0.25 L/sec
FEF2575-%Change-Post: -2 %
FEF2575-%Pred-Post: 12 %
FEF2575-%Pred-Pre: 12 %
FEV1-%Change-Post: -4 %
FEV1-%Pred-Post: 23 %
FEV1-%Pred-Pre: 24 %
FEV1-Post: 0.55 L
FEV1-Pre: 0.58 L
FEV1FVC-%Change-Post: -3 %
FEV1FVC-%Pred-Pre: 49 %
FEV6-%Change-Post: -2 %
FEV6-%Pred-Post: 48 %
FEV6-%Pred-Pre: 49 %
FEV6-Post: 1.43 L
FEV6-Pre: 1.46 L
FEV6FVC-%Change-Post: -1 %
FEV6FVC-%Pred-Post: 98 %
FEV6FVC-%Pred-Pre: 99 %
FVC-%Change-Post: -1 %
FVC-%Pred-Post: 49 %
FVC-%Pred-Pre: 50 %
FVC-Post: 1.52 L
FVC-Pre: 1.54 L
Post FEV1/FVC ratio: 36 %
Post FEV6/FVC ratio: 94 %
Pre FEV1/FVC ratio: 38 %
Pre FEV6/FVC Ratio: 95 %
RV % pred: 221 %
RV: 4.76 L
TLC % pred: 124 %
TLC: 6.3 L

## 2023-02-16 MED ORDER — ALBUTEROL SULFATE (2.5 MG/3ML) 0.083% IN NEBU: 2.5000 mg | INHALATION_SOLUTION | Freq: Four times a day (QID) | RESPIRATORY_TRACT | 5 refills | Status: DC | PRN

## 2023-02-16 NOTE — Patient Instructions (Signed)
Full PFT performed today. °

## 2023-02-16 NOTE — Progress Notes (Signed)
Full PFT performed today. °

## 2023-02-16 NOTE — Progress Notes (Unsigned)
@Patient  ID: Cheryl Haley, female    DOB: 1955-12-18, 67 y.o.   MRN: [id number]  Chief Complaint  Patient presents with   Follow-up    Referring provider: [facility] *  HPI: 67 year old female followed for COPD and chronic respiratory failure on oxygen and pulmonary nodules Patient is on chronic steroids since July 2022  TEST/EVENTS :  Chest Imaging- films reviewed: CT chest 06/28/2019- severe centrilobular emphysema throughout both lungs.  Left upper lobe spiculated nodule associated with linear scar.  Right middle lobe spiculated nodule.  Airway thickening.  Scarring in right greater than left lower lobes.  No significant mediastinal or hilar adenopathy.  Nodules stable or smaller than previous CT scans.   CT cardiac scoring 10/07/2019- redemonstrated right middle lobe nodule.  No new concerning nodules.   Interval near complete resolution of previously demonstrated lingular infiltrate, consistent with resolving pneumonia. 2. The 3 suspicious right lung nodules seen on the most recent study persist, and based on multiplicity could be postinflammatory.   PET scan 07/09/2021- Moderate metabolic activity associated with irregular nodules clustered in the RIGHT upper lobe. Favor post inflammatory or infectious process. Cannot exclude neoplasm. Recommend tissue sampling versus CT surveillance. 2. Persistent but decreased activity in LEFT upper lobe parenchymal lesion.     navigational bronchoscopy August 28, 2021.  Cytology was negative for malignancy.  Cultures were negative.   CT chest Jan 10, 2022 showed minimally progressed right upper lobe consolidation and new nodular opacities along the lateral aspect of this consolidation questionable mucoid impaction, previous pulmonary nodules stable    CT chest December 05, 2022 decrease size of right upper lobe consolidation consistent with residual scarring, irregular solid pulmonary nodules left lower lobe measuring 6 mm  and 4 mm.  CT chest Jan 26, 2023 negative for PE, patchy nodular consolidation right lung apex and previous small pulmonary nodules have resolved.  Pulmonary Functions Testing Results: Spirometry 2015: FVC  2.0 (63%) FEV1  0.8 (33%) Ratio 41   10/25/2019 SPECT scan: EF 63%, low risk study.  No EKG changes during stress.   02/16/2023 Follow up ; COPD, O2 RF, ER follow-up Patient returns for 29-month follow-up.  Patient has underlying severe COPD.  Had recent COPD exacerbation was seen in the emergency room on Jan 26, 2023 that showed possible new right upper lobe pneumonia.  She was treated with Augmentin and azithromycin along with a prednisone challenge.  Patient says she is feeling better.  Cough and congestion have decreased.  Her breathing is nearing back to her baseline.  She remains on Breztri twice daily.  She is on chronic steroids with prednisone 5 mg daily.  She also takes Daliresp daily.  She remains on oxygen 2 L at rest and 3 L with activity.  She has had no increased oxygen demands.  She did have pulmonary function testing that was done today that showed very severe airflow obstruction with FEV1 at 24%, ratio 38, FVC 50% and DLCO at 28%.  This is very similar to 2022. Weight remains low at 102lbs. But improved from last year. Up 10lbs.      Allergies  Allergen Reactions   Bisoprolol Fumarate Other (See Comments)    Other reaction(s): heart issues    Immunization History  Administered Date(s) Administered   Fluad Quad(high Dose 65+) 06/06/2020, 05/10/2021, 06/05/2022   Influenza Split 05/31/2015, 06/07/2018, 05/09/2019   Influenza,inj,Quad PF,6+ Mos 09/22/2013, 06/10/2016, 06/25/2017, 04/30/2018, 06/01/2019   Influenza,inj,quad, With Preservative 09/07/2015   PFIZER(Purple Top)SARS-COV-2 Vaccination 11/27/2019, 12/18/2019  Pneumococcal Conjugate-13 04/17/2015   Pneumococcal Polysaccharide-23 12/08/2016   Tdap 04/10/2017    Past Medical History:  Diagnosis Date    Agatston coronary artery calcium score greater than 400    score 678   Anxiety    Arthritis    "in neck"   COPD (chronic obstructive pulmonary disease) (HCC)    Dyspnea    Emphysema lung (HCC)    Essential thrombocythemia (HCC) 09/15/2019   History of hiatal hernia    Hx of TIA (transient ischemic attack) and stroke    Hyperlipidemia    Hypertension    Hypothyroidism    Impaired fasting glucose    Pleural effusion 05/15/2014   CXR 05/2014:  New small right effusion.     Pneumonia    Protein-calorie malnutrition, severe 09/17/2018   Pulmonary nodules 07/14/2018   07/2017 CT chest >pulmonary nodules noted.  07/2017 PET scan -Hypermetabolic LUL nodule  12/2017 CT chest >decreased nodule size, new pulmonary nodules noted.  FOB -atypical cells  CT chest 07/2018 >stable nodules to smaller on established nodules . 2 new nodules RUL , RLL >CT chest 3 months    Thyroid disease    Tobacco abuse 11/28/2015   Smoked 2 packs of cigarettes daily from teenage years until age 81, quit in 2016    Unspecified hypothyroidism 09/20/2013   Viral respiratory infection 09/16/2018    Tobacco History: Social History   Tobacco Use  Smoking Status Former   Packs/day: 2.00   Years: 40.00   Additional pack years: 0.00   Total pack years: 80.00   Types: Cigarettes   Quit date: 03/16/2017   Years since quitting: 5.9  Smokeless Tobacco Never  Tobacco Comments       Counseling given: Not Answered Tobacco comments:     Outpatient Medications Prior to Visit  Medication Sig Dispense Refill   albuterol (VENTOLIN HFA) 108 (90 Base) MCG/ACT inhaler Inhale 2 puffs into the lungs every 6 (six) hours as needed for wheezing or shortness of breath. 18 g 6   alendronate (FOSAMAX) 70 MG tablet Take 70 mg by mouth every Saturday.     amitriptyline (ELAVIL) 50 MG tablet Take 50 mg by mouth at bedtime.     aspirin EC 81 MG tablet Take 81 mg daily by mouth.      Budeson-Glycopyrrol-Formoterol (BREZTRI AEROSPHERE)  160-9-4.8 MCG/ACT AERO INHALE 2 PUFFS TWICE DAILY 11 g 5   DALIRESP 500 MCG TABS tablet Take 1 tablet by mouth once daily 30 tablet 2   Evolocumab (REPATHA SURECLICK) 140 MG/ML SOAJ Inject 140 mg into the skin every 14 (fourteen) days. 2 mL 11   gabapentin (NEURONTIN) 300 MG capsule Take 300 mg by mouth 2 (two) times daily.     hydroxyurea (HYDREA) 500 MG capsule TAKE 1 CAPSULE BY MOUTH EVERY OTHER DAY WITH FOOD TO  MINIMIZE  GI  SIDE  EFFECTS 45 capsule 0   levothyroxine (SYNTHROID) 75 MCG tablet Take 75 mcg by mouth every morning.     metoprolol succinate (TOPROL-XL) 25 MG 24 hr tablet Take 1 tablet (25 mg total) by mouth daily. 90 tablet 3   Multiple Vitamin (MULTIVITAMIN WITH MINERALS) TABS tablet Take 1 tablet by mouth daily.     OXYGEN Inhale 2 L into the lungs continuous.     predniSONE (DELTASONE) 5 MG tablet Take 1 tablet by mouth once daily with breakfast 30 tablet 0   roflumilast (DALIRESP) 500 MCG TABS tablet Take 1 tablet (500 mcg total) by mouth daily.  30 tablet 11   rosuvastatin (CRESTOR) 40 MG tablet Take 1 tablet by mouth once daily 90 tablet 3   azithromycin (ZITHROMAX) 250 MG tablet Take 1 tablet (250 mg total) by mouth daily. Take first 2 tablets together, then 1 every day until finished. (Patient not taking: Reported on 02/16/2023) 6 tablet 0   No facility-administered medications prior to visit.     Review of Systems:   Constitutional:   No  weight loss, night sweats,  Fevers, chills, fatigue, or  lassitude.  HEENT:   No headaches,  Difficulty swallowing,  Tooth/dental problems, or  Sore throat,                No sneezing, itching, ear ache, nasal congestion, post nasal drip,   CV:  No chest pain,  Orthopnea, PND, swelling in lower extremities, anasarca, dizziness, palpitations, syncope.   GI  No heartburn, indigestion, abdominal pain, nausea, vomiting, diarrhea, change in bowel habits, loss of appetite, bloody stools.   Resp: No shortness of breath with exertion or  at rest.  No excess mucus, no productive cough,  No non-productive cough,  No coughing up of blood.  No change in color of mucus.  No wheezing.  No chest wall deformity  Skin: no rash or lesions.  GU: no dysuria, change in color of urine, no urgency or frequency.  No flank pain, no hematuria   MS:  No joint pain or swelling.  No decreased range of motion.  No back pain.    Physical Exam  BP (!) 110/50 (BP Location: Left Arm, Patient Position: Sitting, Cuff Size: Normal)   Pulse 100   Temp 98.3 F (36.8 C) (Oral)   Ht 5\' 4"  (1.626 m)   Wt 102 lb 3.2 oz (46.4 kg)   SpO2 94%   BMI 17.54 kg/m   GEN: A/Ox3; pleasant , NAD, thin and frail , O2    HEENT:  [place]/AT,  EACs-clear, TMs-wnl, NOSE-clear, THROAT-clear, no lesions, no postnasal drip or exudate noted.   NECK:  Supple w/ fair ROM; no JVD; normal carotid impulses w/o bruits; no thyromegaly or nodules palpated; no lymphadenopathy.    RESP few trace rhonchi  no accessory muscle use, no dullness to percussion  CARD:  RRR, no m/r/g, no peripheral edema, pulses intact, no cyanosis or clubbing.  GI:   Soft & nt; nml bowel sounds; no organomegaly or masses detected.   Musco: Warm bil, no deformities or joint swelling noted.   Neuro: alert, no focal deficits noted.    Skin: Warm, no lesions or rashes    Lab Results:   BMET   BNP   Imaging: CT Angio Chest PE W and/or Wo Contrast  Result Date: 01/26/2023 CLINICAL DATA:  Cough and shortness of breath since yesterday. Low-grade fever. EXAM: CT ANGIOGRAPHY CHEST WITH CONTRAST TECHNIQUE: Multidetector CT imaging of the chest was performed using the standard protocol during bolus administration of intravenous contrast. Multiplanar CT image reconstructions and MIPs were obtained to evaluate the vascular anatomy. RADIATION DOSE REDUCTION: This exam was performed according to the departmental dose-optimization program which includes automated exposure control, adjustment of the mA  and/or kV according to patient size and/or use of iterative reconstruction technique. CONTRAST:  OMNIPAQUE IOHEXOL 350 MG/ML SOLN COMPARISON:  Chest x-ray from same day. CT chest dated December 05, 2022. FINDINGS: Cardiovascular: Satisfactory opacification of the pulmonary arteries to the segmental level. No evidence of pulmonary embolism. Normal heart size. Unchanged trace pericardial effusion. No thoracic aortic  aneurysm or dissection. Coronary, aortic arch, and branch vessel atherosclerotic vascular disease. Mediastinum/Nodes: No enlarged mediastinal, hilar, or axillary lymph nodes. Thyroid gland, trachea, and esophagus demonstrate no significant findings. Lungs/Pleura: Severe centrilobular emphysema again noted. Increased interstitial thickening and patchy nodular consolidation in the right lung apex. Unchanged scarring around the fiducial marker in the peripheral right upper lobe. No pleural effusion or pneumothorax. Small solid pulmonary nodules seen on the prior CT have resolved. No new pulmonary nodule. Upper Abdomen: No acute abnormality. Musculoskeletal: No chest wall abnormality. No acute or significant osseous findings. Review of the MIP images confirms the above findings. IMPRESSION: 1. No evidence of pulmonary embolism. 2. Increased interstitial thickening and patchy nodular consolidation in the right lung apex, concerning for recurrent pneumonia. 3. Small solid pulmonary nodules seen on the prior CT have resolved. No new pulmonary nodule. 4. Aortic Atherosclerosis (ICD10-I70.0) and Emphysema (ICD10-J43.9). Electronically Signed   By: Obie Dredge M.D.   On: 01/26/2023 10:58   DG Chest Portable 1 View  Result Date: 01/26/2023 CLINICAL DATA:  Increasing cough and shortness of breath since yesterday. Low-grade fever. History of right upper lobe pneumonia. EXAM: PORTABLE CHEST 1 VIEW COMPARISON:  CT chest dated December 05, 2022. Chest x-ray dated February 10, 2022. FINDINGS: The heart size and  mediastinal contours are within normal limits. Normal pulmonary vascularity. The lungs remain hyperinflated with coarse interstitial markings. Irregular scarring in the right upper lobe is not significantly changed compared to recent chest CT. Unchanged fiducial marker in the right upper lobe. No focal consolidation, pleural effusion, or pneumothorax. No acute osseous abnormality. IMPRESSION: 1. No active disease. 2. COPD.  Stable post-infectious scarring in the right upper lobe. Electronically Signed   By: Obie Dredge M.D.   On: 01/26/2023 09:27         Latest Ref Rng & Units 02/16/2023    9:52 AM 07/19/2021   10:13 AM  PFT Results  FVC-Pre L 1.54  P 1.48   FVC-Predicted Pre % 50  P 47   FVC-Post L 1.52  P   FVC-Predicted Post % 49  P   Pre FEV1/FVC % % 38  P 36   Post FEV1/FCV % % 36  P   FEV1-Pre L 0.58  P 0.54   FEV1-Predicted Pre % 24  P 22   FEV1-Post L 0.55  P   DLCO uncorrected ml/min/mmHg 5.59  P 5.56   DLCO UNC% % 28  P 28   DLCO corrected ml/min/mmHg 5.70  P 5.56   DLCO COR %Predicted % 29  P 28   DLVA Predicted % 46  P 47   TLC L 6.30  P   TLC % Predicted % 124  P   RV % Predicted % 221  P     P Preliminary result    No results found for: "NITRICOXIDE"      Assessment & Plan:   No problem-specific Assessment & Plan notes found for this encounter.     Rubye Oaks, NP 02/16/2023

## 2023-02-16 NOTE — Patient Instructions (Addendum)
Continue on BREZTRI 2 puffs Twice daily  , rinse after use.  Albuterol inhaler/neb As needed   Flutter valve daily.  Continue on Prednisone 5mg  daily  Continue on Daliresp daily  Continue on Oxygen 2l/m at rest and 3 L with activity Activity as tolerated.  High protein diet .  CT chest in 3 months.  Follow up with Dr. Tonia Brooms in 3 months after CT chest and As needed

## 2023-02-17 ENCOUNTER — Ambulatory Visit (HOSPITAL_COMMUNITY): Payer: Medicare Other

## 2023-02-17 NOTE — Assessment & Plan Note (Signed)
Continue on oxygen to maintain O2 saturations greater than 88 to 90%. 

## 2023-02-17 NOTE — Addendum Note (Signed)
Addended by: Delrae Rend on: 02/17/2023 05:11 PM   Modules accepted: Orders

## 2023-02-17 NOTE — Assessment & Plan Note (Signed)
High-protein diet 

## 2023-02-17 NOTE — Assessment & Plan Note (Signed)
Follow up CT chest in 3 months  

## 2023-02-17 NOTE — Assessment & Plan Note (Signed)
Recent COPD exacerbation with possible superimposed right upper lobe pneumonia.  Clinically patient has improved after antibiotics.  Will check CT chest in 3 months to further evaluate area.  Aspiration precautions discussed PFT are stable.   Plan  Patient Instructions  Continue on BREZTRI 2 puffs Twice daily  , rinse after use.  Albuterol inhaler/neb As needed   Flutter valve daily.  Continue on Prednisone 5mg  daily  Continue on Daliresp daily  Continue on Oxygen 2l/m at rest and 3 L with activity Activity as tolerated.  High protein diet .  CT chest in 3 months.  Follow up with Dr. Tonia Brooms in 3 months after CT chest and As needed

## 2023-02-19 ENCOUNTER — Ambulatory Visit (HOSPITAL_COMMUNITY): Payer: Medicare Other

## 2023-02-24 ENCOUNTER — Ambulatory Visit (HOSPITAL_COMMUNITY): Payer: Medicare Other

## 2023-02-26 ENCOUNTER — Ambulatory Visit (HOSPITAL_COMMUNITY): Payer: Medicare Other

## 2023-03-03 ENCOUNTER — Ambulatory Visit (HOSPITAL_COMMUNITY): Payer: Medicare Other

## 2023-03-08 ENCOUNTER — Other Ambulatory Visit: Payer: Self-pay | Admitting: Pulmonary Disease

## 2023-03-08 ENCOUNTER — Other Ambulatory Visit: Payer: Self-pay | Admitting: Hematology & Oncology

## 2023-03-08 DIAGNOSIS — D649 Anemia, unspecified: Secondary | ICD-10-CM

## 2023-03-09 ENCOUNTER — Other Ambulatory Visit: Payer: Self-pay

## 2023-03-09 MED ORDER — ALBUTEROL SULFATE (2.5 MG/3ML) 0.083% IN NEBU: 2.5000 mg | INHALATION_SOLUTION | Freq: Four times a day (QID) | RESPIRATORY_TRACT | 5 refills | Status: DC | PRN

## 2023-03-10 ENCOUNTER — Ambulatory Visit (HOSPITAL_COMMUNITY): Payer: Medicare Other

## 2023-03-12 ENCOUNTER — Telehealth: Payer: Self-pay | Admitting: Pulmonary Disease

## 2023-03-12 ENCOUNTER — Ambulatory Visit (HOSPITAL_COMMUNITY): Payer: Medicare Other

## 2023-03-12 NOTE — Telephone Encounter (Signed)
Disregard

## 2023-03-12 NOTE — Telephone Encounter (Signed)
PT states her PCP saw something on her Xray that was a concern so that Dr. sent it to Dr. Tonia Brooms. PT wonders if he saw it and how he feels about it. Pls call PT @ [phone]

## 2023-03-16 ENCOUNTER — Encounter: Payer: Self-pay | Admitting: Gastroenterology

## 2023-03-16 ENCOUNTER — Ambulatory Visit: Payer: Medicare Other | Admitting: Gastroenterology

## 2023-03-16 VITALS — BP 140/62 | HR 76 | Ht 64.0 in | Wt 102.6 lb

## 2023-03-16 DIAGNOSIS — K625 Hemorrhage of anus and rectum: Secondary | ICD-10-CM | POA: Diagnosis not present

## 2023-03-16 MED ORDER — NA SULFATE-K SULFATE-MG SULF 17.5-3.13-1.6 GM/177ML PO SOLN: 1.0000 | ORAL | 0 refills | Status: DC

## 2023-03-16 NOTE — Patient Instructions (Signed)
You have been scheduled for a colonoscopy. Please follow written instructions given to you at your visit today.   Please pick up your prep supplies at the pharmacy within the next 1-3 days.  If you use inhalers (even only as needed), please bring them with you on the day of your procedure.  DO NOT TAKE 7 DAYS PRIOR TO TEST- Trulicity (dulaglutide) Ozempic, Wegovy (semaglutide) Mounjaro (tirzepatide) Bydureon Bcise (exanatide extended release)  DO NOT TAKE 1 DAY PRIOR TO YOUR TEST Rybelsus (semaglutide) Adlyxin (lixisenatide) Victoza (liraglutide) Byetta (exanatide) ___________________________________________________________________________  We have sent the following medications to your pharmacy for you to pick up at your convenience:  Suprep   Due to recent changes in healthcare laws, you may see the results of your imaging and laboratory studies on MyChart before your provider has had a chance to review them.  We understand that in some cases there may be results that are confusing or concerning to you. Not all laboratory results come back in the same time frame and the provider may be waiting for multiple results in order to interpret others.  Please give [place] 48 hours in order for your provider to thoroughly review all the results before contacting the office for clarification of your results.   Thank you for choosing me and [facility].  Doug Sou PA-C

## 2023-03-16 NOTE — Progress Notes (Signed)
I agree with the assessment and plan as outlined by Ms. Zehr. 

## 2023-03-16 NOTE — Progress Notes (Signed)
03/16/2023 Cheryl Haley [id number] Apr 22, 1956   HISTORY OF PRESENT ILLNESS: This is a 67 year old female who is new to our office.  She presents here today at the request of her PCP, Dr. Nicholos Johns, for evaluation of rectal bleeding.  She tells me that since April she has intermittently been seeing some bright red blood, maybe kind of blood-tinged mucus, mixed in with brown stool.  This does not happen every time she has a bowel movement, but most times.  She has never had a colonoscopy in the past.  She denies any abdominal pain.  Says for the most part her stools are fairly normal, with the last couple days she has had some diarrhea.  No black stools.  Her hemoglobin and iron studies are normal.  She is on chronic oxygen.   Past Medical History:  Diagnosis Date   Agatston coronary artery calcium score greater than 400    score 678   Anxiety    Arthritis    "in neck"   COPD (chronic obstructive pulmonary disease) (HCC)    Diabetes (HCC)    Dyspnea    Emphysema lung (HCC)    Essential thrombocythemia (HCC) 09/15/2019   History of hiatal hernia    Hx of TIA (transient ischemic attack) and stroke    Hyperlipidemia    Hypertension    Hypothyroidism    Impaired fasting glucose    Pleural effusion 05/15/2014   CXR 05/2014:  New small right effusion.     Pneumonia    Protein-calorie malnutrition, severe 09/17/2018   Pulmonary nodules 07/14/2018   07/2017 CT chest >pulmonary nodules noted.  07/2017 PET scan -Hypermetabolic LUL nodule  12/2017 CT chest >decreased nodule size, new pulmonary nodules noted.  FOB -atypical cells  CT chest 07/2018 >stable nodules to smaller on established nodules . 2 new nodules RUL , RLL >CT chest 3 months    Thyroid disease    Tobacco abuse 11/28/2015   Smoked 2 packs of cigarettes daily from teenage years until age 25, quit in 2016    Unspecified hypothyroidism 09/20/2013   Viral respiratory infection 09/16/2018   Past Surgical History:   Procedure Laterality Date   BRONCHIAL BIOPSY  08/20/2021   Procedure: BRONCHIAL BIOPSIES;  Surgeon: Josephine Igo, DO;  Location: [facility];  Service: Pulmonary;;   BRONCHIAL BRUSHINGS  08/20/2021   Procedure: BRONCHIAL BRUSHINGS;  Surgeon: Josephine Igo, DO;  Location: [facility];  Service: Pulmonary;;   BRONCHIAL NEEDLE ASPIRATION BIOPSY  08/20/2021   Procedure: BRONCHIAL NEEDLE ASPIRATION BIOPSIES;  Surgeon: Josephine Igo, DO;  Location: [facility];  Service: Pulmonary;;   BRONCHIAL WASHINGS  08/20/2021   Procedure: BRONCHIAL WASHINGS;  Surgeon: Josephine Igo, DO;  Location: [facility];  Service: Pulmonary;;   ESOPHAGOGASTRODUODENOSCOPY     EYE SURGERY     FIDUCIAL MARKER PLACEMENT  08/20/2021   Procedure: FIDUCIAL MARKER PLACEMENT;  Surgeon: Josephine Igo, DO;  Location: [facility];  Service: Pulmonary;;   VIDEO BRONCHOSCOPY WITH ENDOBRONCHIAL NAVIGATION Left 07/29/2017   Procedure: VIDEO BRONCHOSCOPY WITH ENDOBRONCHIAL NAVIGATION;  Surgeon: Leslye Peer, MD;  Location: [facility];  Service: Thoracic;  Laterality: Left;   VIDEO BRONCHOSCOPY WITH RADIAL ENDOBRONCHIAL ULTRASOUND  08/20/2021   Procedure: RADIAL ENDOBRONCHIAL ULTRASOUND;  Surgeon: Josephine Igo, DO;  Location: [facility];  Service: Pulmonary;;    reports that she quit smoking about 6 years ago. Her smoking use included cigarettes. She started smoking about 46 years ago. She has a 80  pack-year smoking history. She has never used smokeless tobacco. She reports that she does not currently use alcohol. She reports that she does not use drugs. family history includes Cancer in her sister and sister; Congestive Heart Failure in her sister and sister; Emphysema in her brother, father, sister, and sister; Heart disease in her brother, brother, father, mother, and sister. Allergies  Allergen Reactions   Bisoprolol Fumarate Other (See Comments)    Other reaction(s): heart issues      Outpatient  Encounter Medications as of 03/16/2023  Medication Sig   albuterol (PROVENTIL) (2.5 MG/3ML) 0.083% nebulizer solution Take 3 mLs (2.5 mg total) by nebulization every 6 (six) hours as needed for wheezing or shortness of breath.   albuterol (VENTOLIN HFA) 108 (90 Base) MCG/ACT inhaler Inhale 2 puffs into the lungs every 6 (six) hours as needed for wheezing or shortness of breath.   alendronate (FOSAMAX) 70 MG tablet Take 70 mg by mouth every Saturday.   amitriptyline (ELAVIL) 50 MG tablet Take 50 mg by mouth at bedtime.   aspirin EC 81 MG tablet Take 81 mg daily by mouth.    Budeson-Glycopyrrol-Formoterol (BREZTRI AEROSPHERE) 160-9-4.8 MCG/ACT AERO INHALE 2 PUFFS TWICE DAILY   DALIRESP 500 MCG TABS tablet Take 1 tablet by mouth once daily   Evolocumab (REPATHA SURECLICK) 140 MG/ML SOAJ Inject 140 mg into the skin every 14 (fourteen) days.   gabapentin (NEURONTIN) 300 MG capsule Take 300 mg by mouth 2 (two) times daily.   hydroxyurea (HYDREA) 500 MG capsule TAKE ONE CAPSULE BY MOUTH EVERY OTHER DAY WITH FOOD TO MINIMIZE GI SIDE EFFECTS   levothyroxine (SYNTHROID) 75 MCG tablet Take 75 mcg by mouth every morning.   metoprolol succinate (TOPROL-XL) 25 MG 24 hr tablet Take 1 tablet (25 mg total) by mouth daily.   Multiple Vitamin (MULTIVITAMIN WITH MINERALS) TABS tablet Take 1 tablet by mouth daily.   OXYGEN Inhale 2 L into the lungs continuous.   predniSONE (DELTASONE) 5 MG tablet Take 1 tablet by mouth once daily with breakfast   roflumilast (DALIRESP) 500 MCG TABS tablet Take 1 tablet (500 mcg total) by mouth daily.   rosuvastatin (CRESTOR) 40 MG tablet Take 1 tablet by mouth once daily   No facility-administered encounter medications on file as of 03/16/2023.    REVIEW OF SYSTEMS  : All other systems reviewed and negative except where noted in the History of Present Illness.   PHYSICAL EXAM: BP (!) 140/62   Pulse 76   Ht 5\' 4"  (1.626 m)   Wt 102 lb 9.6 oz (46.5 kg)   BMI 17.61 kg/m   General: Well developed emale in no acute distress Head: Normocephalic and atraumatic Eyes:  Sclerae anicteric, conjunctiva pink. Ears: Normal auditory acuity Lungs: Clear throughout to auscultation; no W/R/R. Heart: Regular rate and rhythm; no M/R/G. Abdomen: Soft, non-distended.  BS present.  Non-tender. Rectal:  Will be done at the time of colonoscopy. Musculoskeletal: Symmetrical with no gross deformities  Skin: No lesions on visible extremities Extremities: No edema  Neurological: Alert oriented x 4, grossly non-focal Psychological:  Alert and cooperative. Normal mood and affect  ASSESSMENT AND PLAN: *Rectal bleeding: Describes what looks like bright red blood maybe with a little bit of mucus mixed in with brown stool intermittently.  Has never had colonoscopy in the past.  Will schedule for colonoscopy at the hospital with Dr. Leonides Schanz.  Hemoglobin and iron studies are normal. *Chronic O2 use  CC:  [facility] *

## 2023-03-16 NOTE — Telephone Encounter (Signed)
Dr. Tonia Brooms please advise on recent cxr. Patient is wanting to know your opinion.

## 2023-03-17 ENCOUNTER — Ambulatory Visit (HOSPITAL_COMMUNITY): Payer: Medicare Other

## 2023-03-19 ENCOUNTER — Ambulatory Visit (HOSPITAL_COMMUNITY): Payer: Medicare Other

## 2023-03-19 NOTE — Telephone Encounter (Signed)
Called and spoke to patient. She states she was referring to the CTa. Patient went to ED on 5/27 for ShOB and had the CTa which resulted as potential recurrent pna. Patient was scripted abx and pred during ED visit. She was questioning the results. I reviewed the impression with the patient and answered her questions. Patient was last seen in 01/2023 and advised to follow up in 3 months with CT and OV. Advised patient to follow this plan and we will see her in clinic after her scan in September. Pt verbalized understanding and denied any further questions or concerns at this time.

## 2023-03-24 ENCOUNTER — Ambulatory Visit (HOSPITAL_COMMUNITY): Payer: Medicare Other

## 2023-03-26 ENCOUNTER — Ambulatory Visit (HOSPITAL_COMMUNITY): Payer: Medicare Other

## 2023-03-26 ENCOUNTER — Encounter (HOSPITAL_COMMUNITY): Payer: Self-pay

## 2023-03-26 ENCOUNTER — Telehealth (HOSPITAL_COMMUNITY): Payer: Self-pay

## 2023-03-26 NOTE — Telephone Encounter (Signed)
Called to confirm appt. Pt did not answer. VM left. Instructed pt on proper footwear.

## 2023-03-27 ENCOUNTER — Encounter (HOSPITAL_COMMUNITY)
Admission: RE | Admit: 2023-03-27 | Discharge: 2023-03-27 | Disposition: A | Discharge status: Home / Self Care | Payer: Medicare Other | Source: Ambulatory Visit | Attending: Pulmonary Disease | Admitting: Pulmonary Disease

## 2023-03-27 ENCOUNTER — Encounter (HOSPITAL_COMMUNITY): Payer: Self-pay

## 2023-03-27 VITALS — BP 132/60 | HR 101 | Wt 105.2 lb

## 2023-03-27 DIAGNOSIS — J9611 Chronic respiratory failure with hypoxia: Secondary | ICD-10-CM | POA: Insufficient documentation

## 2023-03-27 NOTE — Progress Notes (Signed)
Pulmonary Individual Treatment Plan  Patient Details  Name: Cheryl Haley MRN: [id number] Date of Birth: 1956/09/01 Referring Provider:   Doristine Devoid Pulmonary Rehab Walk Test from 03/27/2023 in [facility]  Referring Provider Icard       Initial Encounter Date:  Flowsheet Row Pulmonary Rehab Walk Test from 03/27/2023 in [facility]  Date 03/27/23       Visit Diagnosis: Chronic hypoxemic respiratory failure (HCC)  Patient's Home Medications on Admission:   Current Outpatient Medications:    albuterol (PROVENTIL) (2.5 MG/3ML) 0.083% nebulizer solution, Take 3 mLs (2.5 mg total) by nebulization every 6 (six) hours as needed for wheezing or shortness of breath., Disp: 75 mL, Rfl: 5   albuterol (VENTOLIN HFA) 108 (90 Base) MCG/ACT inhaler, Inhale 2 puffs into the lungs every 6 (six) hours as needed for wheezing or shortness of breath., Disp: 18 g, Rfl: 6   alendronate (FOSAMAX) 70 MG tablet, Take 70 mg by mouth every Saturday., Disp: , Rfl:    amitriptyline (ELAVIL) 50 MG tablet, Take 50 mg by mouth at bedtime., Disp: , Rfl:    aspirin EC 81 MG tablet, Take 81 mg daily by mouth. , Disp: , Rfl:    Budeson-Glycopyrrol-Formoterol (BREZTRI AEROSPHERE) 160-9-4.8 MCG/ACT AERO, INHALE 2 PUFFS TWICE DAILY, Disp: 11 g, Rfl: 5   DALIRESP 500 MCG TABS tablet, Take 1 tablet by mouth once daily, Disp: 30 tablet, Rfl: 2   Evolocumab (REPATHA SURECLICK) 140 MG/ML SOAJ, Inject 140 mg into the skin every 14 (fourteen) days., Disp: 2 mL, Rfl: 11   hydroxyurea (HYDREA) 500 MG capsule, TAKE ONE CAPSULE BY MOUTH EVERY OTHER DAY WITH FOOD TO MINIMIZE GI SIDE EFFECTS, Disp: 45 capsule, Rfl: 0   levothyroxine (SYNTHROID) 75 MCG tablet, Take 75 mcg by mouth every morning., Disp: , Rfl:    metoprolol succinate (TOPROL-XL) 25 MG 24 hr tablet, Take 1 tablet (25 mg total) by mouth daily., Disp: 90  tablet, Rfl: 3   Multiple Vitamin (MULTIVITAMIN WITH MINERALS) TABS tablet, Take 1 tablet by mouth daily., Disp: , Rfl:    Na Sulfate-K Sulfate-Mg Sulf (SUPREP BOWEL PREP KIT) 17.5-3.13-1.6 GM/177ML SOLN, Take 1 kit by mouth as directed. For colonoscopy prep, Disp: 354 mL, Rfl: 0   OXYGEN, Inhale 2 L into the lungs continuous., Disp: , Rfl:    predniSONE (DELTASONE) 5 MG tablet, Take 1 tablet by mouth once daily with breakfast, Disp: 30 tablet, Rfl: 0   roflumilast (DALIRESP) 500 MCG TABS tablet, Take 1 tablet (500 mcg total) by mouth daily., Disp: 30 tablet, Rfl: 11   rosuvastatin (CRESTOR) 40 MG tablet, Take 1 tablet by mouth once daily, Disp: 90 tablet, Rfl: 3   gabapentin (NEURONTIN) 300 MG capsule, Take 300 mg by mouth 2 (two) times daily. (Patient not taking: Reported on 03/27/2023), Disp: , Rfl:   Past Medical History: Past Medical History:  Diagnosis Date   Agatston coronary artery calcium score greater than 400    score 678   Anxiety    Arthritis    "in neck"   COPD (chronic obstructive pulmonary disease) (HCC)    Diabetes (HCC)    Dyspnea    Emphysema lung (HCC)    Essential thrombocythemia (HCC) 09/15/2019   History of hiatal hernia    Hx of TIA (transient ischemic attack) and stroke    Hyperlipidemia    Hypertension    Hypothyroidism  Impaired fasting glucose    Pleural effusion 05/15/2014   CXR 05/2014:  New small right effusion.     Pneumonia    Protein-calorie malnutrition, severe 09/17/2018   Pulmonary nodules 07/14/2018   07/2017 CT chest >pulmonary nodules noted.  07/2017 PET scan -Hypermetabolic LUL nodule  12/2017 CT chest >decreased nodule size, new pulmonary nodules noted.  FOB -atypical cells  CT chest 07/2018 >stable nodules to smaller on established nodules . 2 new nodules RUL , RLL >CT chest 3 months    Thyroid disease    Tobacco abuse 11/28/2015   Smoked 2 packs of cigarettes daily from teenage years until age 28, quit in 2016    Unspecified  hypothyroidism 09/20/2013   Viral respiratory infection 09/16/2018    Tobacco Use: Social History   Tobacco Use  Smoking Status Former   Current packs/day: 0.00   Average packs/day: 2.0 packs/day for 40.0 years (80.0 ttl pk-yrs)   Types: Cigarettes   Start date: 03/16/1977   Quit date: 03/16/2017   Years since quitting: 6.0  Smokeless Tobacco Never  Tobacco Comments        Labs: Review Flowsheet  More data exists      Latest Ref Rng & Units 03/28/2021 07/01/2021 07/16/2021 09/18/2021 10/27/2022  Labs for ITP Cardiac and Pulmonary Rehab  Cholestrol 100 - 199 mg/dL - - 213  086  578   LDL (calc) 0 - 99 mg/dL - - 79  87  30   HDL-C >39 mg/dL - - 57  59  63   Trlycerides 0 - 149 mg/dL - - 58  58  45   PH, Arterial 7.350 - 7.450 - 7.429  - - -  PCO2 arterial 32.0 - 48.0 mmHg - 41.3  - - -  Bicarbonate 20.0 - 28.0 mmol/L 27.9  26.8  - - -  O2 Saturation % 69.8  98.1  - - -    Details            Capillary Blood Glucose: Lab Results  Component Value Date   GLUCAP 101 (H) 06/02/2022   GLUCAP 102 (H) 07/09/2021   GLUCAP 107 (H) 07/14/2017   GLUCAP 202 (H) 09/22/2013   GLUCAP 132 (H) 09/21/2013     Pulmonary Assessment Scores:  Pulmonary Assessment Scores     Row Name 03/27/23 1057         ADL UCSD   ADL Phase Entry     SOB Score total 74       CAT Score   CAT Score 14       mMRC Score   mMRC Score 4             UCSD: Self-administered rating of dyspnea associated with activities of daily living (ADLs) 6-point scale (0 = "not at all" to 5 = "maximal or unable to do because of breathlessness")  Scoring Scores range from 0 to 120.  Minimally important difference is 5 units  CAT: CAT can identify the health impairment of COPD patients and is better correlated with disease progression.  CAT has a scoring range of zero to 40. The CAT score is classified into four groups of low (less than 10), medium (10 - 20), high (21-30) and very high (31-40) based on  the impact level of disease on health status. A CAT score over 10 suggests significant symptoms.  A worsening CAT score could be explained by an exacerbation, poor medication adherence, poor inhaler technique, or progression of COPD or comorbid  conditions.  CAT MCID is 2 points  mMRC: mMRC (Modified Medical Research Council) Dyspnea Scale is used to assess the degree of baseline functional disability in patients of respiratory disease due to dyspnea. No minimal important difference is established. A decrease in score of 1 point or greater is considered a positive change.   Pulmonary Function Assessment:  Pulmonary Function Assessment - 03/27/23 1055       Breath   Bilateral Breath Sounds Clear;Decreased    Shortness of Breath Yes;Fear of Shortness of Breath;Limiting activity             Exercise Target Goals: Exercise Program Goal: Individual exercise prescription set using results from initial 6 min walk test and THRR while considering  patient's activity barriers and safety.   Exercise Prescription Goal: Initial exercise prescription builds to 30-45 minutes a day of aerobic activity, 2-3 days per week.  Home exercise guidelines will be given to patient during program as part of exercise prescription that the participant will acknowledge.  Activity Barriers & Risk Stratification:  Activity Barriers & Cardiac Risk Stratification - 03/27/23 1050       Activity Barriers & Cardiac Risk Stratification   Activity Barriers Deconditioning;Muscular Weakness;Shortness of Breath    Cardiac Risk Stratification Moderate             6 Minute Walk:  6 Minute Walk     Row Name 03/27/23 1130         6 Minute Walk   Phase Initial     Distance 555 feet     Walk Time 6 minutes     # of Rest Breaks 0     MPH 1.05     METS 2.21     RPE 13     Perceived Dyspnea  2     VO2 Peak 7.74     Symptoms No     Resting HR 94 bpm     Resting BP 132/60     Resting Oxygen Saturation  96 %      Exercise Oxygen Saturation  during 6 min walk 93 %     Max Ex. HR 105 bpm     Max Ex. BP 132/60     2 Minute Post BP 124/60       Interval HR   1 Minute HR 102     2 Minute HR 103     3 Minute HR 102     4 Minute HR 103     5 Minute HR 104     6 Minute HR 105     2 Minute Post HR 97     Interval Heart Rate? Yes       Interval Oxygen   Interval Oxygen? Yes     Baseline Oxygen Saturation % 96 %     1 Minute Oxygen Saturation % 98 %     1 Minute Liters of Oxygen 2 L     2 Minute Oxygen Saturation % 98 %     2 Minute Liters of Oxygen 2 L     3 Minute Oxygen Saturation % 97 %     3 Minute Liters of Oxygen 2 L     4 Minute Oxygen Saturation % 95 %     4 Minute Liters of Oxygen 2 L     5 Minute Oxygen Saturation % 95 %     5 Minute Liters of Oxygen 2 L     6 Minute Oxygen Saturation %  95 %     6 Minute Liters of Oxygen 2 L     2 Minute Post Oxygen Saturation % 93 %     2 Minute Post Liters of Oxygen 2 L              Oxygen Initial Assessment:  Oxygen Initial Assessment - 03/27/23 1051       Home Oxygen   Home Oxygen Device Portable Concentrator;Home Concentrator;E-Tanks   E tanks for backup   Sleep Oxygen Prescription Continuous    Liters per minute 2    Home Exercise Oxygen Prescription Continuous    Liters per minute 2    Home Resting Oxygen Prescription Continuous    Liters per minute 2    Compliance with Home Oxygen Use Yes      Initial 6 min Walk   Oxygen Used Continuous    Liters per minute 2      Program Oxygen Prescription   Program Oxygen Prescription Continuous    Liters per minute 2      Intervention   Short Term Goals To learn and exhibit compliance with exercise, home and travel O2 prescription;To learn and understand importance of maintaining oxygen saturations>88%;To learn and demonstrate proper use of respiratory medications;To learn and understand importance of monitoring SPO2 with pulse oximeter and demonstrate accurate use of the pulse  oximeter.;To learn and demonstrate proper pursed lip breathing techniques or other breathing techniques.     Long  Term Goals Exhibits compliance with exercise, home  and travel O2 prescription;Maintenance of O2 saturations>88%;Compliance with respiratory medication;Verbalizes importance of monitoring SPO2 with pulse oximeter and return demonstration;Exhibits proper breathing techniques, such as pursed lip breathing or other method taught during program session;Demonstrates proper use of MDI's             Oxygen Re-Evaluation:   Oxygen Discharge (Final Oxygen Re-Evaluation):   Initial Exercise Prescription:  Initial Exercise Prescription - 03/27/23 1100       Date of Initial Exercise RX and Referring Provider   Date 03/27/23    Referring Provider Icard    Expected Discharge Date 06/25/23      Oxygen   Oxygen Continuous    Liters 2    Maintain Oxygen Saturation 88% or higher      NuStep   Level 1    SPM 50    Minutes 30    METs 1.5      Prescription Details   Frequency (times per week) 2    Duration Progress to 30 minutes of continuous aerobic without signs/symptoms of physical distress      Intensity   THRR 40-80% of Max Heartrate 61-122    Ratings of Perceived Exertion 11-13    Perceived Dyspnea 0-4      Progression   Progression Continue to progress workloads to maintain intensity without signs/symptoms of physical distress.      Resistance Training   Training Prescription Yes    Weight red bands    Reps 10-15             Perform Capillary Blood Glucose checks as needed.  Exercise Prescription Changes:   Exercise Comments:   Exercise Goals and Review:   Exercise Goals     Row Name 03/27/23 1050             Exercise Goals   Increase Physical Activity Yes       Intervention Provide advice, education, support and counseling about physical activity/exercise needs.;Develop an individualized exercise prescription  for aerobic and resistive  training based on initial evaluation findings, risk stratification, comorbidities and participant's personal goals.       Expected Outcomes Short Term: Attend rehab on a regular basis to increase amount of physical activity.;Long Term: Exercising regularly at least 3-5 days a week.;Long Term: Add in home exercise to make exercise part of routine and to increase amount of physical activity.       Increase Strength and Stamina Yes       Intervention Provide advice, education, support and counseling about physical activity/exercise needs.;Develop an individualized exercise prescription for aerobic and resistive training based on initial evaluation findings, risk stratification, comorbidities and participant's personal goals.       Expected Outcomes Short Term: Increase workloads from initial exercise prescription for resistance, speed, and METs.;Short Term: Perform resistance training exercises routinely during rehab and add in resistance training at home;Long Term: Improve cardiorespiratory fitness, muscular endurance and strength as measured by increased METs and functional capacity ( )       Able to understand and use rate of perceived exertion (RPE) scale Yes       Intervention Provide education and explanation on how to use RPE scale       Expected Outcomes Short Term: Able to use RPE daily in rehab to express subjective intensity level;Long Term:  Able to use RPE to guide intensity level when exercising independently       Able to understand and use Dyspnea scale Yes       Intervention Provide education and explanation on how to use Dyspnea scale       Expected Outcomes Short Term: Able to use Dyspnea scale daily in rehab to express subjective sense of shortness of breath during exertion;Long Term: Able to use Dyspnea scale to guide intensity level when exercising independently       Knowledge and understanding of Target Heart Rate Range (THRR) Yes       Intervention Provide education and  explanation of THRR including how the numbers were predicted and where they are located for reference       Expected Outcomes Short Term: Able to state/look up THRR;Long Term: Able to use THRR to govern intensity when exercising independently;Short Term: Able to use daily as guideline for intensity in rehab       Understanding of Exercise Prescription Yes       Intervention Provide education, explanation, and written materials on patient's individual exercise prescription       Expected Outcomes Short Term: Able to explain program exercise prescription;Long Term: Able to explain home exercise prescription to exercise independently                Exercise Goals Re-Evaluation :   Discharge Exercise Prescription (Final Exercise Prescription Changes):   Nutrition:  Target Goals: Understanding of nutrition guidelines, daily intake of sodium 1500mg , cholesterol 200mg , calories 30% from fat and 7% or less from saturated fats, daily to have 5 or more servings of fruits and vegetables.  Biometrics:  Pre Biometrics - 03/27/23 1134       Pre Biometrics   Grip Strength 21 kg              Nutrition Therapy Plan and Nutrition Goals:   Nutrition Assessments:  MEDIFICTS Score Key: ?70 Need to make dietary changes  40-70 Heart Healthy Diet ? 40 Therapeutic Level Cholesterol Diet   Picture Your Plate Scores: <09 Unhealthy dietary pattern with much room for improvement. 41-50 Dietary pattern unlikely to meet  recommendations for good health and room for improvement. 51-60 More healthful dietary pattern, with some room for improvement.  >60 Healthy dietary pattern, although there may be some specific behaviors that could be improved.    Nutrition Goals Re-Evaluation:   Nutrition Goals Discharge (Final Nutrition Goals Re-Evaluation):   Psychosocial: Target Goals: Acknowledge presence or absence of significant depression and/or stress, maximize coping skills, provide positive  support system. Participant is able to verbalize types and ability to use techniques and skills needed for reducing stress and depression.  Initial Review & Psychosocial Screening:  Initial Psych Review & Screening - 03/27/23 1045       Initial Review   Current issues with Current Psychotropic Meds   prescribed psychotropic meds for shingles     Family Dynamics   Good Support System? Yes      Barriers   Psychosocial barriers to participate in program There are no identifiable barriers or psychosocial needs.      Screening Interventions   Interventions Encouraged to exercise             Quality of Life Scores:  Scores of 19 and below usually indicate a poorer quality of life in these areas.  A difference of  2-3 points is a clinically meaningful difference.  A difference of 2-3 points in the total score of the Quality of Life Index has been associated with significant improvement in overall quality of life, self-image, physical symptoms, and general health in studies assessing change in quality of life.  PHQ-9: Review Flowsheet       03/27/2023  Depression screen PHQ 2/9  Decreased Interest 0  Down, Depressed, Hopeless 0  PHQ - 2 Score 0  Altered sleeping 0  Tired, decreased energy 1  Change in appetite 0  Feeling bad or failure about yourself  0  Trouble concentrating 0  Moving slowly or fidgety/restless 0  Suicidal thoughts 0  PHQ-9 Score 1  Difficult doing work/chores Somewhat difficult    Details           Interpretation of Total Score  Total Score Depression Severity:  1-4 = Minimal depression, 5-9 = Mild depression, 10-14 = Moderate depression, 15-19 = Moderately severe depression, 20-27 = Severe depression   Psychosocial Evaluation and Intervention:  Psychosocial Evaluation - 03/27/23 1047       Psychosocial Evaluation & Interventions   Interventions Encouraged to exercise with the program and follow exercise prescription    Comments Tareka denies  any psychosocial barriers or concerns at this time    Expected Outcomes For Nila to participate in PR free of any psychosocial barriers or concerns    Continue Psychosocial Services  No Follow up required             Psychosocial Re-Evaluation:   Psychosocial Discharge (Final Psychosocial Re-Evaluation):   Education: Education Goals: Education classes will be provided on a weekly basis, covering required topics. Participant will state understanding/return demonstration of topics presented.  Learning Barriers/Preferences:  Learning Barriers/Preferences - 03/27/23 1048       Learning Barriers/Preferences   Learning Barriers None    Learning Preferences Group Instruction;Individual Instruction             Education Topics: Introduction to Pulmonary Rehab Group instruction provided by PowerPoint, verbal discussion, and written material to support subject matter. Instructor reviews what Pulmonary Rehab is, the purpose of the program, and how patients are referred.     Know Your Numbers Group instruction that is supported by a  PowerPoint presentation. Instructor discusses importance of knowing and understanding resting, exercise, and post-exercise oxygen saturation, heart rate, and blood pressure. Oxygen saturation, heart rate, blood pressure, rating of perceived exertion, and dyspnea are reviewed along with a normal range for these values.    Exercise for the Pulmonary Patient Group instruction that is supported by a PowerPoint presentation. Instructor discusses benefits of exercise, core components of exercise, frequency, duration, and intensity of an exercise routine, importance of utilizing pulse oximetry during exercise, safety while exercising, and options of places to exercise outside of rehab.       MET Level  Group instruction provided by PowerPoint, verbal discussion, and written material to support subject matter. Instructor reviews what METs are and how to  increase METs.    Pulmonary Medications Verbally interactive group education provided by instructor with focus on inhaled medications and proper administration.   Anatomy and Physiology of the Respiratory System Group instruction provided by PowerPoint, verbal discussion, and written material to support subject matter. Instructor reviews respiratory cycle and anatomical components of the respiratory system and their functions. Instructor also reviews differences in obstructive and restrictive respiratory diseases with examples of each.    Oxygen Safety Group instruction provided by PowerPoint, verbal discussion, and written material to support subject matter. There is an overview of "What is Oxygen" and "Why do we need it".  Instructor also reviews how to create a safe environment for oxygen use, the importance of using oxygen as prescribed, and the risks of noncompliance. There is a brief discussion on traveling with oxygen and resources the patient may utilize.   Oxygen Use Group instruction provided by PowerPoint, verbal discussion, and written material to discuss how supplemental oxygen is prescribed and different types of oxygen supply systems. Resources for more information are provided.    Breathing Techniques Group instruction that is supported by demonstration and informational handouts. Instructor discusses the benefits of pursed lip and diaphragmatic breathing and detailed demonstration on how to perform both.     Risk Factor Reduction Group instruction that is supported by a PowerPoint presentation. Instructor discusses the definition of a risk factor, different risk factors for pulmonary disease, and how the heart and lungs work together.   MD Day A group question and answer session with a medical doctor that allows participants to ask questions that relate to their pulmonary disease state.   Nutrition for the Pulmonary Patient Group instruction provided by PowerPoint  slides, verbal discussion, and written materials to support subject matter. The instructor gives an explanation and review of healthy diet recommendations, which includes a discussion on weight management, recommendations for fruit and vegetable consumption, as well as protein, fluid, caffeine, fiber, sodium, sugar, and alcohol. Tips for eating when patients are short of breath are discussed.    Other Education Group or individual verbal, written, or video instructions that support the educational goals of the pulmonary rehab program.    Knowledge Questionnaire Score:  Knowledge Questionnaire Score - 03/27/23 1103       Knowledge Questionnaire Score   Pre Score 13/18             Core Components/Risk Factors/Patient Goals at Admission:  Personal Goals and Risk Factors at Admission - 03/27/23 1048       Core Components/Risk Factors/Patient Goals on Admission    Weight Management Weight Gain;Yes    Intervention Weight Management: Develop a combined nutrition and exercise program designed to reach desired caloric intake, while maintaining appropriate intake of nutrient and fiber, sodium  and fats, and appropriate energy expenditure required for the weight goal.;Weight Management: Provide education and appropriate resources to help participant work on and attain dietary goals.;Weight Management/Obesity: Establish reasonable short term and long term weight goals.;Obesity: Provide education and appropriate resources to help participant work on and attain dietary goals.    Admit Weight 105 lb 2.6 oz (47.7 kg)    Expected Outcomes Short Term: Continue to assess and modify interventions until short term weight is achieved;Long Term: Adherence to nutrition and physical activity/exercise program aimed toward attainment of established weight goal;Weight Gain: Understanding of general recommendations for a high calorie, high protein meal plan that promotes weight gain by distributing calorie intake  throughout the day with the consumption for 4-5 meals, snacks, and/or supplements    Improve shortness of breath with ADL's Yes    Intervention Provide education, individualized exercise plan and daily activity instruction to help decrease symptoms of SOB with activities of daily living.    Expected Outcomes Short Term: Improve cardiorespiratory fitness to achieve a reduction of symptoms when performing ADLs;Long Term: Be able to perform more ADLs without symptoms or delay the onset of symptoms    Increase knowledge of respiratory medications and ability to use respiratory devices properly  Yes    Intervention Provide education and demonstration as needed of appropriate use of medications, inhalers, and oxygen therapy.    Expected Outcomes Short Term: Achieves understanding of medications use. Understands that oxygen is a medication prescribed by physician. Demonstrates appropriate use of inhaler and oxygen therapy.;Long Term: Maintain appropriate use of medications, inhalers, and oxygen therapy.    Hypertension Yes    Intervention Provide education on lifestyle modifcations including regular physical activity/exercise, weight management, moderate sodium restriction and increased consumption of fresh fruit, vegetables, and low fat dairy, alcohol moderation, and smoking cessation.;Monitor prescription use compliance.    Expected Outcomes Short Term: Continued assessment and intervention until BP is < 140/41mm HG in hypertensive participants. < 130/9mm HG in hypertensive participants with diabetes, heart failure or chronic kidney disease.;Long Term: Maintenance of blood pressure at goal levels.             Core Components/Risk Factors/Patient Goals Review:    Core Components/Risk Factors/Patient Goals at Discharge (Final Review):    ITP Comments:   Comments: Dr. Mechele Collin is Medical Director for Pulmonary Rehab at [facility].

## 2023-03-27 NOTE — Progress Notes (Signed)
Cheryl Haley 67 y.o. female Pulmonary Rehab Orientation Note This patient who was referred to Pulmonary Rehab by Dr. Tonia Brooms with the diagnosis of chronic hypoxemic respiratory failure arrived today in Cardiac and Pulmonary Rehab. She  arrived ambulatory with assistive device with normal gait. She  does carry portable oxygen. [employer] is the provider for their DME. Per patient, Cheryl Haley uses oxygen continuously. Color good, skin warm and dry. Patient is oriented to time and place. Patient's medical history, psychosocial health, and medications reviewed. Psychosocial assessment reveals patient lives with sibling(s). Cheryl Haley is currently unemployed. Patient hobbies include watching tv and putting together puzzles . Patient reports her stress level is low. Areas of stress/anxiety include health. Patient does not exhibit signs of depression. PHQ2/9 score 0/1. Cheryl Haley shows good  coping skills with positive outlook on life. Offered emotional support and reassurance. Will continue to monitor. Physical assessment performed by Nurse pick: Cheryl Hart RN. Please see their orientation physical assessment note. Cheryl Haley reports she does take medications as prescribed. Patient states she follows a regular  diet. The patient reports no specific efforts to gain or lose weight.. Patient's weight will be monitored closely. Demonstration and practice of PLB using pulse oximeter. Cheryl Haley able to return demonstration satisfactorily. Safety and hand hygiene in the exercise area reviewed with patient. Cheryl Haley understanding of the information reviewed. Department expectations discussed with patient and achievable goals were set. The patient shows enthusiasm about attending the program and we look forward to working with Cheryl Haley. Cheryl Haley completed a 6 min walk test today and is scheduled to begin exercise on 8/1@1 :15.   1010-1130 Cheryl Haley, BSRT

## 2023-03-27 NOTE — Progress Notes (Signed)
Pulmonary Rehab Orientation Physical Assessment Note  Physical assessment reveals patient is alert and oriented x 4.  Heart rate is normal, breath sounds clear to diminished, no wheezes, rales, or rhonchi. Pt denies chronic cough, occasional dry cough. Bowel sounds present x4 quads. Pt denies abdominal discomfort, nausea, vomiting, diarrhea or constipation. Grip strength equal, strong. Distal pulses +2; no swelling to lower extremities.   Essie Hart, RN, BSN

## 2023-03-30 ENCOUNTER — Encounter (HOSPITAL_COMMUNITY): Payer: Self-pay | Admitting: Internal Medicine

## 2023-03-30 NOTE — Progress Notes (Signed)
Attempted to obtain medical history via telephone, unable to reach at this time. HIPAA compliant voicemail message left requesting return call to pre surgical testing department. 

## 2023-03-31 ENCOUNTER — Telehealth: Payer: Self-pay | Admitting: Adult Health

## 2023-03-31 ENCOUNTER — Ambulatory Visit (HOSPITAL_COMMUNITY): Payer: Medicare Other

## 2023-03-31 NOTE — Progress Notes (Signed)
Harvel Quale  Prep instructions- reviewed  Duane Boston MD Cardiologist- Dr Mayford Knife  EKG- 01/26/23 Echo-08/24/20 Cath-n/a Stress- 10/25/19 ICD/PM-n/a Blood thinner- n/a GLP-1- n/a  Hx: CAD, HTN, COPD, TIA, Thrombocytopenia, Emyphysema, DM. Last saw pulm on 6/17, no new complaints, on 2L 02. Last visit with cardiology 07/11/22 with a year f/u states no new cardiac symptoms.  Anesthesia Review: Yes

## 2023-03-31 NOTE — Telephone Encounter (Signed)
Fax received from Dr. Imogene Burn with Cheryl Haley to perform a Endoscopy on patient.  Patient needs surgery clearance. Surgery is 04/06/23. Patient was seen on 02/16/23. Office protocol is a risk assessment can be sent to surgeon if patient has been seen in 60 days or less.   Sending to [facility] for risk assessment or recommendations if patient needs to be seen in office prior to surgical procedure.

## 2023-04-02 ENCOUNTER — Ambulatory Visit (HOSPITAL_COMMUNITY): Payer: Medicare Other

## 2023-04-02 ENCOUNTER — Encounter (HOSPITAL_COMMUNITY)
Admission: RE | Admit: 2023-04-02 | Discharge: 2023-04-02 | Disposition: A | Discharge status: Home / Self Care | Payer: Medicare Other | Source: Ambulatory Visit | Attending: Pulmonary Disease | Admitting: Pulmonary Disease

## 2023-04-02 DIAGNOSIS — J9611 Chronic respiratory failure with hypoxia: Secondary | ICD-10-CM | POA: Diagnosis present

## 2023-04-02 NOTE — Telephone Encounter (Signed)
Cheryl Haley is calling again regarding clearance. Patient is scheduled for 8/5 and they need to know if she is cleared as Cheryl Haley is now calling them too. Call will likley go to their front staff, but please ask for Cheryl Haley and they can transfer. Marking as urgent since 8/5 is next week.

## 2023-04-02 NOTE — Progress Notes (Signed)
Daily Session Note  Patient Details  Name: Cheryl Haley MRN: [id number] Date of Birth: 20-Oct-1955 Referring Provider:   Doristine Devoid Pulmonary Rehab Walk Test from 03/27/2023 in [facility]  Referring Provider Icard       Encounter Date: 04/02/2023  Check In:  Session Check In - 04/02/23 1354       Check-In   Supervising physician immediately available to respond to emergencies [facility] MD immediately available    Physician(s) Carlyon Shadow, NP    Location [facility]    Staff Present Samantha Belarus, RD, LDN;Randi Dionisio Paschal, ACSM-CEP, Exercise Physiologist;Mary Gerre Scull, RN, BSN;Shironda Kain, RT;Jetta Walker BS, ACSM-CEP, Exercise Physiologist    Virtual Visit No    Medication changes reported     No    Fall or balance concerns reported    No    Tobacco Cessation No Change    Warm-up and Cool-down Performed as group-led instruction    Resistance Training Performed Yes    VAD Patient? No    PAD/SET Patient? No      Pain Assessment   Currently in Pain? No/denies    Multiple Pain Sites No             Capillary Blood Glucose: No results found for this or any previous visit (from the past 24 hour(s)).    Social History   Tobacco Use  Smoking Status Former   Current packs/day: 0.00   Average packs/day: 2.0 packs/day for 40.0 years (80.0 ttl pk-yrs)   Types: Cigarettes   Start date: 03/16/1977   Quit date: 03/16/2017   Years since quitting: 6.0  Smokeless Tobacco Never  Tobacco Comments        Goals Met:  Proper associated with RPD/PD & O2 Sat Independence with exercise equipment Exercise tolerated well No report of concerns or symptoms today Strength training completed today  Goals Unmet:  Not Applicable  Comments: Service time is from 1321 to 1450.    Dr. Mechele Collin is Medical Director for Pulmonary Rehab at [facility].

## 2023-04-03 NOTE — Telephone Encounter (Signed)
OV notes and clearance form have been faxed back to [employer]. Nothing further needed at this time.

## 2023-04-03 NOTE — Telephone Encounter (Signed)
Tammy can you please advise on Surgical clearance?

## 2023-04-03 NOTE — Telephone Encounter (Signed)
Dr Leonides SchanzLorain Childes  See clearance from Pulmonary   Hospital Anesthesia Team requested Pulmonary Clearance.

## 2023-04-03 NOTE — Telephone Encounter (Signed)
Patient was last seen in the office on February 16, 2023 she has underlying very severe COPD.  She had recently recovered from a COPD exacerbation and was improved and back to baseline.  She is on maximum maintenance regimen with Breztri, prednisone, Daliresp.  She is on chronic oxygen 2 L at rest and 3 L with activity.  She has very low lung function with recent pulmonary function testing that was done that showed very severe airflow obstruction with FEV1 at 24%, ratio 38, FVC 50% and DLCO at 28%. This is very similar to 2022.   From a pulmonary standpoint patient is high risk with her degree of COPD and chronic respiratory failure with oxygen dependence. If Endoscopy procedure is deemed necessary should be completed in hospital setting  Major Pulmonary risks identified in the multifactorial risk analysis are but not limited to a) pneumonia; b) recurrent intubation risk; c) prolonged or recurrent acute respiratory failure needing mechanical ventilation; d) prolonged hospitalization; e) DVT/Pulmonary embolism; f) Acute Pulmonary edema  Recommend 1. Short duration of surgery as much as possible and avoid paralytic if possible  Recovery in step down or ICU with Pulmonary consultation if indicated  . Aggressive pulmonary toilet with o2, bronchodilatation, and incentive spirometry and early ambulation

## 2023-04-06 ENCOUNTER — Ambulatory Visit (HOSPITAL_COMMUNITY): Payer: Medicare Other | Admitting: Registered Nurse

## 2023-04-06 ENCOUNTER — Encounter (HOSPITAL_COMMUNITY)
Admission: RE | Disposition: A | Discharge status: Home / Self Care | Payer: Self-pay | Source: Home / Self Care | Attending: Internal Medicine

## 2023-04-06 ENCOUNTER — Ambulatory Visit (HOSPITAL_COMMUNITY)
Admission: RE | Admit: 2023-04-06 | Discharge: 2023-04-06 | Disposition: A | Discharge status: Home / Self Care | Payer: Medicare Other | Attending: Internal Medicine | Admitting: Internal Medicine

## 2023-04-06 ENCOUNTER — Other Ambulatory Visit: Payer: Self-pay

## 2023-04-06 ENCOUNTER — Encounter (HOSPITAL_COMMUNITY): Payer: Self-pay | Admitting: Internal Medicine

## 2023-04-06 ENCOUNTER — Ambulatory Visit (HOSPITAL_BASED_OUTPATIENT_CLINIC_OR_DEPARTMENT_OTHER): Payer: Medicare Other | Admitting: Registered Nurse

## 2023-04-06 DIAGNOSIS — J439 Emphysema, unspecified: Secondary | ICD-10-CM | POA: Diagnosis not present

## 2023-04-06 DIAGNOSIS — E119 Type 2 diabetes mellitus without complications: Secondary | ICD-10-CM | POA: Diagnosis not present

## 2023-04-06 DIAGNOSIS — Z7952 Long term (current) use of systemic steroids: Secondary | ICD-10-CM | POA: Insufficient documentation

## 2023-04-06 DIAGNOSIS — K625 Hemorrhage of anus and rectum: Secondary | ICD-10-CM | POA: Insufficient documentation

## 2023-04-06 DIAGNOSIS — Z87891 Personal history of nicotine dependence: Secondary | ICD-10-CM

## 2023-04-06 DIAGNOSIS — D120 Benign neoplasm of cecum: Secondary | ICD-10-CM | POA: Diagnosis not present

## 2023-04-06 DIAGNOSIS — J449 Chronic obstructive pulmonary disease, unspecified: Secondary | ICD-10-CM | POA: Diagnosis not present

## 2023-04-06 DIAGNOSIS — D128 Benign neoplasm of rectum: Secondary | ICD-10-CM | POA: Insufficient documentation

## 2023-04-06 DIAGNOSIS — I10 Essential (primary) hypertension: Secondary | ICD-10-CM | POA: Insufficient documentation

## 2023-04-06 DIAGNOSIS — Z7951 Long term (current) use of inhaled steroids: Secondary | ICD-10-CM | POA: Diagnosis not present

## 2023-04-06 DIAGNOSIS — D126 Benign neoplasm of colon, unspecified: Secondary | ICD-10-CM | POA: Diagnosis not present

## 2023-04-06 DIAGNOSIS — I2510 Atherosclerotic heart disease of native coronary artery without angina pectoris: Secondary | ICD-10-CM

## 2023-04-06 DIAGNOSIS — D123 Benign neoplasm of transverse colon: Secondary | ICD-10-CM | POA: Insufficient documentation

## 2023-04-06 DIAGNOSIS — K648 Other hemorrhoids: Secondary | ICD-10-CM | POA: Diagnosis not present

## 2023-04-06 DIAGNOSIS — Z9981 Dependence on supplemental oxygen: Secondary | ICD-10-CM | POA: Diagnosis not present

## 2023-04-06 HISTORY — PX: POLYPECTOMY: SHX5525

## 2023-04-06 HISTORY — PX: COLONOSCOPY WITH PROPOFOL: SHX5780

## 2023-04-06 LAB — GLUCOSE, CAPILLARY: Glucose-Capillary: 103 mg/dL — ABNORMAL HIGH (ref 70–99)

## 2023-04-06 SURGERY — COLONOSCOPY WITH PROPOFOL
Anesthesia: Monitor Anesthesia Care

## 2023-04-06 MED ORDER — PROPOFOL 500 MG/50ML IV EMUL
INTRAVENOUS | Status: DC | PRN
  Administered 2023-04-06: 125 ug/kg/min via INTRAVENOUS

## 2023-04-06 MED ORDER — HYDROCORTISONE (PERIANAL) 2.5 % EX CREA: 1.0000 | TOPICAL_CREAM | Freq: Two times a day (BID) | CUTANEOUS | 0 refills | Status: AC

## 2023-04-06 MED ORDER — PROPOFOL 10 MG/ML IV BOLUS
INTRAVENOUS | Status: DC | PRN
  Administered 2023-04-06: 50 mg via INTRAVENOUS

## 2023-04-06 MED ORDER — SODIUM CHLORIDE 0.9 % IV SOLN: INTRAVENOUS | Status: DC

## 2023-04-06 MED ORDER — PHENYLEPHRINE HCL (PRESSORS) 10 MG/ML IV SOLN
INTRAVENOUS | Status: DC | PRN
  Administered 2023-04-06: 160 ug via INTRAVENOUS
  Administered 2023-04-06 (×2): 80 ug via INTRAVENOUS

## 2023-04-06 MED ORDER — LACTATED RINGERS IV SOLN: INTRAVENOUS | Status: DC

## 2023-04-06 MED ORDER — LIDOCAINE HCL (CARDIAC) PF 100 MG/5ML IV SOSY
PREFILLED_SYRINGE | INTRAVENOUS | Status: DC | PRN
  Administered 2023-04-06: 40 mg via INTRAVENOUS

## 2023-04-06 SURGICAL SUPPLY — 22 items

## 2023-04-06 NOTE — Op Note (Signed)
[facility] Patient Name: Cheryl Haley Procedure Date: 04/06/2023 MRN: [id number] Attending MD: Particia Lather , , [id number] Date of Birth: 1955-09-07 CSN: [id number] Age: 67 Admit Type: Outpatient Procedure:                Colonoscopy Indications:              Rectal bleeding Providers:                Madelyn Brunner" Jesus Genera, RN, Cephus Richer, RN, Marja Kays, Technician Referring MD:             [facility] Medicines:                Monitored Anesthesia Care Complications:            No immediate complications. Estimated Blood Loss:     Estimated blood loss was minimal. Procedure:                Pre-Anesthesia Assessment:                           - Prior to the procedure, a History and Physical                            was performed, and patient medications and                            allergies were reviewed. The patient's tolerance of                            previous anesthesia was also reviewed. The risks                            and benefits of the procedure and the sedation                            options and risks were discussed with the patient.                            All questions were answered, and informed consent                            was obtained. Prior Anticoagulants: The patient has                            taken no anticoagulant or antiplatelet agents. ASA                            Grade Assessment: III - A patient with severe                            systemic disease. After reviewing the risks and  benefits, the patient was deemed in satisfactory                            condition to undergo the procedure.                           After obtaining informed consent, the colonoscope                            was passed under direct vision. Throughout the                            procedure, the patient's blood pressure,  pulse, and                            oxygen saturations were monitored continuously. The                            PCF-HQ190L ([id number]) Olympus colonoscope was                            introduced through the anus and advanced to the the                            terminal ileum. The colonoscopy was performed                            without difficulty. The patient tolerated the                            procedure well. The quality of the bowel                            preparation was good. The terminal ileum, ileocecal                            valve, appendiceal orifice, and rectum were                            photographed. Scope In: 10:12:43 AM Scope Out: 10:33:21 AM Scope Withdrawal Time: 0 hours 14 minutes 47 seconds  Total Procedure Duration: 0 hours 20 minutes 38 seconds  Findings:      The terminal ileum appeared normal.      Two sessile polyps were found in the transverse colon and cecum. The       polyps were 4 to 6 mm in size. These polyps were removed with a cold       snare. Resection and retrieval were complete.      A 12 mm polyp was found in the rectum. The polyp was sessile. The polyp       was removed with a cold snare. Resection and retrieval were complete.      Non-bleeding internal hemorrhoids were found during retroflexion. Impression:               - The examined portion of the ileum was normal.                           -  Two 4 to 6 mm polyps in the transverse colon and                            in the cecum, removed with a cold snare. Resected                            and retrieved.                           - One 12 mm polyp in the rectum, removed with a                            cold snare. Resected and retrieved.                           - Non-bleeding internal hemorrhoids. Moderate Sedation:      Not Applicable - Patient had care per Anesthesia. Recommendation:           - Discharge patient to home (with escort).                            - Await pathology results.                           - Anusol HC cream BID for 7 days.                           - The findings and recommendations were discussed                            with the patient. Procedure Code(s):        --- Professional ---                           [phone], Colonoscopy, flexible; with removal of                            tumor(s), polyp(s), or other lesion(s) by snare                            technique Diagnosis Code(s):        --- Professional ---                           K64.8, Other hemorrhoids                           D12.3, Benign neoplasm of transverse colon (hepatic                            flexure or splenic flexure)                           D12.0, Benign neoplasm of cecum  D12.8, Benign neoplasm of rectum                           K62.5, Hemorrhage of anus and rectum CPT copyright 2022 American Medical Association. All rights reserved. The codes documented in this report are preliminary and upon coder review may  be revised to meet current compliance requirements. Dr Particia Lather "Alan Ripper" Leonides Schanz,  04/06/2023 10:49:25 AM Number of Addenda: 0

## 2023-04-06 NOTE — H&P (Signed)
GASTROENTEROLOGY PROCEDURE H&P NOTE   Primary Care Physician: [facility]    Reason for Procedure:   Rectal bleeding  Plan:    Colonoscopy  Patient is appropriate for endoscopic procedure(s) in the hospital setting.  The nature of the procedure, as well as the risks, benefits, and alternatives were carefully and thoroughly reviewed with the patient. Ample time for discussion and questions allowed. The patient understood, was satisfied, and agreed to proceed.     HPI: Cheryl Haley is a 67 y.o. female who presents for colonoscopy for evaluation of rectal bleeding .  Patient was most recently seen in the Gastroenterology Clinic on 03/16/23.  No interval change in medical history since that appointment. Please refer to that note for full details regarding GI history and clinical presentation.   Past Medical History:  Diagnosis Date   Agatston coronary artery calcium score greater than 400    score 678   Anxiety    Arthritis    "in neck"   COPD (chronic obstructive pulmonary disease) (HCC)    Diabetes (HCC)    Dyspnea    Emphysema lung (HCC)    Essential thrombocythemia (HCC) 09/15/2019   History of hiatal hernia    Hx of TIA (transient ischemic attack) and stroke    Hyperlipidemia    Hypertension    Hypothyroidism    Impaired fasting glucose    Pleural effusion 05/15/2014   CXR 05/2014:  New small right effusion.     Pneumonia    Protein-calorie malnutrition, severe 09/17/2018   Pulmonary nodules 07/14/2018   07/2017 CT chest >pulmonary nodules noted.  07/2017 PET scan -Hypermetabolic LUL nodule  12/2017 CT chest >decreased nodule size, new pulmonary nodules noted.  FOB -atypical cells  CT chest 07/2018 >stable nodules to smaller on established nodules . 2 new nodules RUL , RLL >CT chest 3 months    Thyroid disease    Tobacco abuse 11/28/2015   Smoked 2 packs of cigarettes daily from teenage years until age 27, quit in 2016    Unspecified  hypothyroidism 09/20/2013   Viral respiratory infection 09/16/2018    Past Surgical History:  Procedure Laterality Date   BRONCHIAL BIOPSY  08/20/2021   Procedure: BRONCHIAL BIOPSIES;  Surgeon: Josephine Igo, DO;  Location: [facility];  Service: Pulmonary;;   BRONCHIAL BRUSHINGS  08/20/2021   Procedure: BRONCHIAL BRUSHINGS;  Surgeon: Josephine Igo, DO;  Location: [facility];  Service: Pulmonary;;   BRONCHIAL NEEDLE ASPIRATION BIOPSY  08/20/2021   Procedure: BRONCHIAL NEEDLE ASPIRATION BIOPSIES;  Surgeon: Josephine Igo, DO;  Location: [facility];  Service: Pulmonary;;   BRONCHIAL WASHINGS  08/20/2021   Procedure: BRONCHIAL WASHINGS;  Surgeon: Josephine Igo, DO;  Location: [facility];  Service: Pulmonary;;   ESOPHAGOGASTRODUODENOSCOPY     EYE SURGERY     FIDUCIAL MARKER PLACEMENT  08/20/2021   Procedure: FIDUCIAL MARKER PLACEMENT;  Surgeon: Josephine Igo, DO;  Location: [facility];  Service: Pulmonary;;   VIDEO BRONCHOSCOPY WITH ENDOBRONCHIAL NAVIGATION Left 07/29/2017   Procedure: VIDEO BRONCHOSCOPY WITH ENDOBRONCHIAL NAVIGATION;  Surgeon: Leslye Peer, MD;  Location: [facility] OR;  Service: Thoracic;  Laterality: Left;   VIDEO BRONCHOSCOPY WITH RADIAL ENDOBRONCHIAL ULTRASOUND  08/20/2021   Procedure: RADIAL ENDOBRONCHIAL ULTRASOUND;  Surgeon: Josephine Igo, DO;  Location: [facility];  Service: Pulmonary;;    Prior to Admission medications   Medication Sig Start Date End Date Taking? Authorizing Provider  albuterol (PROVENTIL) (2.5 MG/3ML) 0.083% nebulizer solution Take 3 mLs (2.5  mg total) by nebulization every 6 (six) hours as needed for wheezing or shortness of breath. 03/09/23 03/08/24 Yes Parrett, Virgel Bouquet, NP  alendronate (FOSAMAX) 70 MG tablet Take 70 mg by mouth every Saturday. 06/13/21  Yes [provider]  amitriptyline (ELAVIL) 50 MG tablet Take 50 mg by mouth at bedtime.   Yes [provider]  aspirin EC 81 MG tablet Take 81 mg daily by  mouth.    Yes [provider]  Budeson-Glycopyrrol-Formoterol (BREZTRI AEROSPHERE) 160-9-4.8 MCG/ACT AERO INHALE 2 PUFFS TWICE DAILY 12/09/22  Yes Icard, Bradley L, DO  DALIRESP 500 MCG TABS tablet Take 1 tablet by mouth once daily 11/10/22  Yes Ramaswamy, Carmin Muskrat, MD  Evolocumab (REPATHA SURECLICK) 140 MG/ML SOAJ Inject 140 mg into the skin every 14 (fourteen) days. 09/02/22  Yes Turner, Cornelious Bryant, MD  hydroxyurea (HYDREA) 500 MG capsule TAKE ONE CAPSULE BY MOUTH EVERY OTHER DAY WITH FOOD TO MINIMIZE GI SIDE EFFECTS 03/09/23  Yes Ennever, Rose Phi, MD  levothyroxine (SYNTHROID) 75 MCG tablet Take 75 mcg by mouth every morning. 04/24/21  Yes [provider]  metoprolol succinate (TOPROL-XL) 25 MG 24 hr tablet Take 1 tablet (25 mg total) by mouth daily. 09/15/22  Yes Turner, Cornelious Bryant, MD  Multiple Vitamin (MULTIVITAMIN WITH MINERALS) TABS tablet Take 1 tablet by mouth daily.   Yes [provider]  OXYGEN Inhale 2 L into the lungs continuous.   Yes [provider]  predniSONE (DELTASONE) 5 MG tablet Take 1 tablet by mouth once daily with breakfast 03/09/23  Yes Icard, Bradley L, DO  roflumilast (DALIRESP) 500 MCG TABS tablet Take 1 tablet (500 mcg total) by mouth daily. 12/19/22  Yes Kalman Shan, MD  rosuvastatin (CRESTOR) 40 MG tablet Take 1 tablet by mouth once daily 08/04/22  Yes Turner, Cornelious Bryant, MD  albuterol (VENTOLIN HFA) 108 (90 Base) MCG/ACT inhaler Inhale 2 puffs into the lungs every 6 (six) hours as needed for wheezing or shortness of breath. 04/14/22   Icard, Rachel Bo, DO  gabapentin (NEURONTIN) 300 MG capsule Take 300 mg by mouth 2 (two) times daily. Patient not taking: Reported on 03/27/2023    [provider]  Na Sulfate-K Sulfate-Mg Sulf (SUPREP BOWEL PREP KIT) 17.5-3.13-1.6 GM/177ML SOLN Take 1 kit by mouth as directed. For colonoscopy prep 03/16/23   Zehr, Princella Pellegrini, PA-C    Current Facility-Administered Medications  Medication Dose Route Frequency  Provider Last Rate Last Admin   0.9 %  sodium chloride infusion   Intravenous Continuous Zehr, Jessica D, PA-C       lactated ringers infusion   Intravenous Continuous Imogene Burn, MD 20 mL/hr at 04/06/23 0956 New Bag at 04/06/23 0956    Allergies as of 03/16/2023 - Review Complete 03/16/2023  Allergen Reaction Noted   Bisoprolol fumarate Other (See Comments) 12/18/2021    Family History  Problem Relation Age of Onset   Heart disease Mother    Emphysema Father    Heart disease Father    Emphysema Sister    Emphysema Sister    Heart disease Sister    Congestive Heart Failure Sister    Congestive Heart Failure Sister    Cancer Sister        lung   Cancer Sister        breast   Emphysema Brother    Heart disease Brother    Heart disease Brother    Liver disease Neg Hx    Esophageal cancer Neg Hx  Colon cancer Neg Hx     Social History   Socioeconomic History   Marital status: Divorced    Spouse name: Not on file   Number of children: 0   Years of education: Not on file   Highest education level: 9th grade  Occupational History   Occupation: retired  Tobacco Use   Smoking status: Former    Current packs/day: 0.00    Average packs/day: 2.0 packs/day for 40.0 years (80.0 ttl pk-yrs)    Types: Cigarettes    Start date: 03/16/1977    Quit date: 03/16/2017    Years since quitting: 6.0   Smokeless tobacco: Never   Tobacco comments:       Vaping Use   Vaping status: Never Used  Substance and Sexual Activity   Alcohol use: Not Currently   Drug use: No   Sexual activity: Not on file  Other Topics Concern   Not on file  Social History Narrative   Sukhleen lives with her sister. Towanna likes to put puzzles together and watch tv.   Social Determinants of Health   Financial Resource Strain: Not on file  Food Insecurity: Not on file  Transportation Needs: Not on file  Physical Activity: Not on file  Stress: Not on file  Social Connections: Unknown (01/13/2022)    [facility]   Social Network    Social Network: Not on file  Intimate Partner Violence: Unknown (12/05/2021)   Received from [facility], [facility]   HITS    Physically Hurt: Not on file    Insult or Talk Down To: Not on file    Threaten Physical Harm: Not on file    Scream or Curse: Not on file    Physical Exam: Vital signs in last 24 hours: BP (!) 151/56   Pulse 83   Resp (!) 23   Ht 5\' 4"  (1.626 m)   Wt 47.6 kg   SpO2 100%   BMI 18.02 kg/m  GEN: NAD EYE: Sclerae anicteric ENT: MMM CV: Non-tachycardic Pulm: No increased WOB GI: Soft NEURO:  Alert & Oriented   Eulah Pont, MD [facility]   04/06/2023 10:01 AM

## 2023-04-06 NOTE — Transfer of Care (Signed)
Immediate Anesthesia Transfer of Care Note  Patient: Cheryl Haley  Procedure(s) Performed: COLONOSCOPY WITH PROPOFOL POLYPECTOMY  Patient Location: PACU  Anesthesia Type:MAC  Level of Consciousness: awake, alert , and oriented  Airway & Oxygen Therapy: Patient Spontanous Breathing  Post-op Assessment: Report given to RN and Post -op Vital signs reviewed and stable  Post vital signs: Reviewed and stable  Last Vitals:  Vitals Value Taken Time  BP    Temp    Pulse    Resp    SpO2      Last Pain:  Vitals:   04/06/23 0943  PainSc: 0-No pain         Complications: No notable events documented.

## 2023-04-06 NOTE — Discharge Instructions (Signed)

## 2023-04-06 NOTE — Anesthesia Preprocedure Evaluation (Signed)
Anesthesia Evaluation  Patient identified by MRN, date of birth, ID band Patient awake    Reviewed: Allergy & Precautions, NPO status , Patient's Chart, lab work & pertinent test results, reviewed documented beta blocker date and time   Airway Mallampati: II  TM Distance: >3 FB Neck ROM: Full    Dental  (+) Teeth Intact, Dental Advisory Given   Pulmonary COPD,  COPD inhaler and oxygen dependent, former smoker   Pulmonary exam normal breath sounds clear to auscultation       Cardiovascular hypertension, Pt. on home beta blockers + CAD  Normal cardiovascular exam Rhythm:Regular Rate:Normal     Neuro/Psych  PSYCHIATRIC DISORDERS Anxiety     TIA   GI/Hepatic Neg liver ROS,,,Rectal bleeding    Endo/Other  diabetesHypothyroidism    Renal/GU negative Renal ROS     Musculoskeletal  (+) Arthritis ,    Abdominal   Peds  Hematology negative hematology ROS (+)   Anesthesia Other Findings   Reproductive/Obstetrics                              Anesthesia Physical Anesthesia Plan  ASA: 4  Anesthesia Plan: MAC   Post-op Pain Management:    Induction: Intravenous  PONV Risk Score and Plan: 2 and TIVA and Treatment may vary due to age or medical condition  Airway Management Planned: Natural Airway and Simple Face Mask  Additional Equipment:   Intra-op Plan:   Post-operative Plan:   Informed Consent: I have reviewed the patients History and Physical, chart, labs and discussed the procedure including the risks, benefits and alternatives for the proposed anesthesia with the patient or authorized representative who has indicated his/her understanding and acceptance.     Dental advisory given  Plan Discussed with: CRNA and Anesthesiologist  Anesthesia Plan Comments:          Anesthesia Quick Evaluation

## 2023-04-06 NOTE — Anesthesia Postprocedure Evaluation (Signed)
Anesthesia Post Note  Patient: Cheryl Haley  Procedure(s) Performed: COLONOSCOPY WITH PROPOFOL POLYPECTOMY     Patient location during evaluation: Endoscopy Anesthesia Type: MAC Level of consciousness: oriented, awake and alert and awake Pain management: pain level controlled Vital Signs Assessment: post-procedure vital signs reviewed and stable Respiratory status: spontaneous breathing, nonlabored ventilation, respiratory function stable and patient connected to nasal cannula oxygen Cardiovascular status: blood pressure returned to baseline and stable Postop Assessment: no headache, no backache and no apparent nausea or vomiting Anesthetic complications: no   No notable events documented.  Last Vitals:  Vitals:   04/06/23 1055 04/06/23 1101  BP: (!) 118/59 126/61  Pulse: 75 72  Resp: (!) 22 (!) 23  Temp:    SpO2: 100% 100%    Last Pain:  Vitals:   04/06/23 1101  TempSrc:   PainSc: 0-No pain                 Collene Schlichter

## 2023-04-07 ENCOUNTER — Ambulatory Visit (HOSPITAL_COMMUNITY): Payer: Medicare Other

## 2023-04-07 ENCOUNTER — Encounter (HOSPITAL_COMMUNITY): Admission: RE | Admit: 2023-04-07 | Payer: Medicare Other | Source: Ambulatory Visit

## 2023-04-07 VITALS — Wt 105.4 lb

## 2023-04-07 DIAGNOSIS — J9611 Chronic respiratory failure with hypoxia: Secondary | ICD-10-CM

## 2023-04-07 NOTE — Progress Notes (Signed)
Daily Session Note  Patient Details  Name: Cheryl Haley MRN: [id number] Date of Birth: 1955-10-06 Referring Provider:   Doristine Devoid Pulmonary Rehab Walk Test from 03/27/2023 in [facility]  Referring Provider Icard       Encounter Date: 04/07/2023  Check In:  Session Check In - 04/07/23 1318       Check-In   Supervising physician immediately available to respond to emergencies [facility] MD immediately available    Physician(s) Carlyon Shadow, NP    Location [facility]    Staff Present Samantha Belarus, RD, LDN;Randi Dionisio Paschal, ACSM-CEP, Exercise Physiologist; Romney, RN, BSN;Casey Smith, Zella Richer, MS, ACSM-CEP, Exercise Physiologist    Virtual Visit No    Medication changes reported     No    Fall or balance concerns reported    No    Tobacco Cessation No Change    Warm-up and Cool-down Performed as group-led instruction    Resistance Training Performed Yes    VAD Patient? No    PAD/SET Patient? No      Pain Assessment   Currently in Pain? No/denies    Pain Score 0-No pain    Multiple Pain Sites No             Capillary Blood Glucose: No results found for this or any previous visit (from the past 24 hour(s)).   Exercise Prescription Changes - 04/07/23 1500       Response to Exercise   Blood Pressure (Admit) 122/56    Blood Pressure (Exercise) 138/68    Blood Pressure (Exit) 128/70    Heart Rate (Admit) 104 bpm    Heart Rate (Exercise) 108 bpm    Heart Rate (Exit) 106 bpm    Oxygen Saturation (Admit) 95 %    Oxygen Saturation (Exercise) 92 %    Oxygen Saturation (Exit) 98 %    Rating of Perceived Exertion (Exercise) 11    Perceived Dyspnea (Exercise) 1    Duration Continue with 30 min of aerobic exercise without signs/symptoms of physical distress.    Intensity THRR unchanged      Resistance Training   Training Prescription Yes    Weight red bands    Reps 10-15     Time 10 Minutes      Oxygen   Oxygen Continuous    Liters 2      NuStep   Level 1    Minutes 30    METs 1.6      Oxygen   Maintain Oxygen Saturation 88% or higher             Social History   Tobacco Use  Smoking Status Former   Current packs/day: 0.00   Average packs/day: 2.0 packs/day for 40.0 years (80.0 ttl pk-yrs)   Types: Cigarettes   Start date: 03/16/1977   Quit date: 03/16/2017   Years since quitting: 6.0  Smokeless Tobacco Never  Tobacco Comments        Goals Met:  Exercise tolerated well No report of concerns or symptoms today Strength training completed today  Goals Unmet:  Not Applicable  Comments: Service time is from 1309 to 48    Dr. Mechele Collin is Medical Director for Pulmonary Rehab at [facility].

## 2023-04-08 ENCOUNTER — Encounter: Payer: Self-pay | Admitting: Internal Medicine

## 2023-04-08 ENCOUNTER — Other Ambulatory Visit: Payer: Self-pay | Admitting: Pulmonary Disease

## 2023-04-08 NOTE — Progress Notes (Signed)
Pulmonary Individual Treatment Plan  Patient Details  Name: Cheryl Haley MRN: [id number] Date of Birth: 21-Nov-1955 Referring Provider:   Doristine Devoid Pulmonary Rehab Walk Test from 03/27/2023 in [facility]  Referring Provider Icard       Initial Encounter Date:  Flowsheet Row Pulmonary Rehab Walk Test from 03/27/2023 in [facility]  Date 03/27/23       Visit Diagnosis: Chronic hypoxemic respiratory failure (HCC)  Patient's Home Medications on Admission:   Current Outpatient Medications:    albuterol (PROVENTIL) (2.5 MG/3ML) 0.083% nebulizer solution, Take 3 mLs (2.5 mg total) by nebulization every 6 (six) hours as needed for wheezing or shortness of breath., Disp: 75 mL, Rfl: 5   albuterol (VENTOLIN HFA) 108 (90 Base) MCG/ACT inhaler, Inhale 2 puffs into the lungs every 6 (six) hours as needed for wheezing or shortness of breath., Disp: 18 g, Rfl: 6   alendronate (FOSAMAX) 70 MG tablet, Take 70 mg by mouth every Saturday., Disp: , Rfl:    amitriptyline (ELAVIL) 50 MG tablet, Take 50 mg by mouth at bedtime., Disp: , Rfl:    aspirin EC 81 MG tablet, Take 81 mg daily by mouth. , Disp: , Rfl:    Budeson-Glycopyrrol-Formoterol (BREZTRI AEROSPHERE) 160-9-4.8 MCG/ACT AERO, INHALE 2 PUFFS TWICE DAILY, Disp: 11 g, Rfl: 5   DALIRESP 500 MCG TABS tablet, Take 1 tablet by mouth once daily, Disp: 30 tablet, Rfl: 2   Evolocumab (REPATHA SURECLICK) 140 MG/ML SOAJ, Inject 140 mg into the skin every 14 (fourteen) days., Disp: 2 mL, Rfl: 11   gabapentin (NEURONTIN) 300 MG capsule, Take 300 mg by mouth 2 (two) times daily. (Patient not taking: Reported on 03/27/2023), Disp: , Rfl:    hydrocortisone (ANUSOL-HC) 2.5 % rectal cream, Place 1 Application rectally 2 (two) times daily for 7 days., Disp: 21 g, Rfl: 0   hydroxyurea (HYDREA) 500 MG capsule, TAKE ONE CAPSULE BY MOUTH EVERY  OTHER DAY WITH FOOD TO MINIMIZE GI SIDE EFFECTS, Disp: 45 capsule, Rfl: 0   levothyroxine (SYNTHROID) 75 MCG tablet, Take 75 mcg by mouth every morning., Disp: , Rfl:    metoprolol succinate (TOPROL-XL) 25 MG 24 hr tablet, Take 1 tablet (25 mg total) by mouth daily., Disp: 90 tablet, Rfl: 3   Multiple Vitamin (MULTIVITAMIN WITH MINERALS) TABS tablet, Take 1 tablet by mouth daily., Disp: , Rfl:    Na Sulfate-K Sulfate-Mg Sulf (SUPREP BOWEL PREP KIT) 17.5-3.13-1.6 GM/177ML SOLN, Take 1 kit by mouth as directed. 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     Pneumonia    Protein-calorie malnutrition, severe 09/17/2018   Pulmonary nodules 07/14/2018   07/2017 CT chest >pulmonary nodules noted.  07/2017 PET scan -Hypermetabolic LUL nodule  12/2017 CT chest >decreased nodule size, new pulmonary nodules noted.  FOB -atypical cells  CT chest 07/2018 >stable nodules to smaller on established nodules . 2 new nodules RUL , RLL >CT chest 3 months    Thyroid disease     Tobacco abuse 11/28/2015   Smoked 2 packs of cigarettes daily from teenage years until age 34, quit in 2016    Unspecified hypothyroidism 09/20/2013   Viral respiratory infection 09/16/2018    Tobacco Use: Social History   Tobacco Use  Smoking Status Former   Current packs/day: 0.00   Average packs/day: 2.0 packs/day for 40.0 years (80.0 ttl pk-yrs)   Types: Cigarettes   Start date: 03/16/1977   Quit date: 03/16/2017   Years since quitting: 6.0  Smokeless Tobacco Never  Tobacco Comments        Labs: Review Flowsheet  More data exists      Latest Ref Rng & Units 03/28/2021 07/01/2021 07/16/2021 09/18/2021 10/27/2022  Labs for ITP Cardiac and Pulmonary Rehab  Cholestrol 100 - 199 mg/dL - - 161  096  045   LDL (calc) 0 - 99 mg/dL - - 79  87  30   HDL-C >39 mg/dL - - 57  59  63   Trlycerides 0 - 149 mg/dL - - 58  58  45   PH, Arterial 7.350 - 7.450 - 7.429  - - -  PCO2 arterial 32.0 - 48.0 mmHg - 41.3  - - -  Bicarbonate 20.0 - 28.0 mmol/L 27.9  26.8  - - -  O2 Saturation % 69.8  98.1  - - -    Details            Capillary Blood Glucose: Lab Results  Component Value Date   GLUCAP 103 (H) 04/06/2023   GLUCAP 101 (H) 06/02/2022   GLUCAP 102 (H) 07/09/2021   GLUCAP 107 (H) 07/14/2017   GLUCAP 202 (H) 09/22/2013     Pulmonary Assessment Scores:  Pulmonary Assessment Scores     Row Name 03/27/23 1057         ADL UCSD   ADL Phase Entry     SOB Score total 74       CAT Score   CAT Score 14       mMRC Score   mMRC Score 4             UCSD: Self-administered rating of dyspnea associated with activities of daily living (ADLs) 6-point scale (0 = "not at all" to 5 = "maximal or unable to do because of breathlessness")  Scoring Scores range from 0 to 120.  Minimally important difference is 5 units  CAT: CAT can identify the health impairment of COPD patients and is better correlated with disease progression.  CAT has a scoring range of zero to 40. The CAT  score is classified into four groups of low (less than 10), medium (10 - 20), high (21-30) and very high (31-40) based on the impact level of disease on health status. A CAT score over 10 suggests significant symptoms.  A worsening CAT score could be explained by an exacerbation, poor medication adherence, poor inhaler technique, or progression of COPD or comorbid conditions.  CAT MCID is 2 points  mMRC: mMRC (Modified Medical Research Council) Dyspnea Scale is used to assess the degree of baseline functional disability in patients of respiratory disease due to dyspnea. No minimal important difference is established. A decrease in score of 1 point or greater is considered a positive change.   Pulmonary Function Assessment:  Pulmonary Function Assessment - 03/27/23 1055       Breath   Bilateral Breath Sounds Clear;Decreased    Shortness of Breath Yes;Fear of Shortness of Breath;Limiting activity             Exercise Target Goals: Exercise Program Goal: Individual exercise prescription set using results from initial 6 min walk test and THRR while considering  patient's activity barriers and safety.   Exercise Prescription Goal: Initial exercise prescription builds to 30-45 minutes a day of aerobic activity, 2-3 days per week.  Home exercise guidelines will be given to patient during program as part of exercise prescription that the participant will acknowledge.  Activity Barriers & Risk Stratification:  Activity Barriers & Cardiac Risk Stratification - 03/27/23 1050       Activity Barriers & Cardiac Risk Stratification   Activity Barriers Deconditioning;Muscular Weakness;Shortness of Breath    Cardiac Risk Stratification Moderate             6 Minute Walk:  6 Minute Walk     Row Name 03/27/23 1130         6 Minute Walk   Phase Initial     Distance 555 feet     Walk Time 6 minutes     # of Rest Breaks 0     MPH 1.05     METS 2.21     RPE 13     Perceived Dyspnea   2     VO2 Peak 7.74     Symptoms No     Resting HR 94 bpm     Resting BP 132/60     Resting Oxygen Saturation  96 %     Exercise Oxygen Saturation  during 6 min walk 93 %     Max Ex. HR 105 bpm     Max Ex. BP 132/60     2 Minute Post BP 124/60       Interval HR   1 Minute HR 102     2 Minute HR 103     3 Minute HR 102     4 Minute HR 103     5 Minute HR 104     6 Minute HR 105     2 Minute Post HR 97     Interval Heart Rate? Yes       Interval Oxygen   Interval Oxygen? Yes     Baseline Oxygen Saturation % 96 %     1 Minute Oxygen Saturation % 98 %     1 Minute Liters of Oxygen 2 L     2 Minute Oxygen Saturation % 98 %     2 Minute Liters of Oxygen 2 L     3 Minute Oxygen Saturation % 97 %     3 Minute Liters of Oxygen 2 L     4 Minute Oxygen Saturation % 95 %     4 Minute Liters of Oxygen 2 L     5 Minute Oxygen Saturation %  95 %     5 Minute Liters of Oxygen 2 L     6 Minute Oxygen Saturation % 95 %     6 Minute Liters of Oxygen 2 L     2 Minute Post Oxygen Saturation % 93 %     2 Minute Post Liters of Oxygen 2 L              Oxygen Initial Assessment:  Oxygen Initial Assessment - 03/27/23 1051       Home Oxygen   Home Oxygen Device Portable Concentrator;Home Concentrator;E-Tanks   E tanks for backup   Sleep Oxygen Prescription Continuous    Liters per minute 2    Home Exercise Oxygen Prescription Continuous    Liters per minute 2    Home Resting Oxygen Prescription Continuous    Liters per minute 2    Compliance with Home Oxygen Use Yes      Initial 6 min Walk   Oxygen Used Continuous    Liters per minute 2      Program Oxygen Prescription   Program Oxygen Prescription Continuous    Liters per minute 2      Intervention   Short Term Goals To learn and exhibit compliance with exercise, home and travel O2 prescription;To learn and understand importance of maintaining oxygen saturations>88%;To learn and demonstrate proper use of respiratory  medications;To learn and understand importance of monitoring SPO2 with pulse oximeter and demonstrate accurate use of the pulse oximeter.;To learn and demonstrate proper pursed lip breathing techniques or other breathing techniques.     Long  Term Goals Exhibits compliance with exercise, home  and travel O2 prescription;Maintenance of O2 saturations>88%;Compliance with respiratory medication;Verbalizes importance of monitoring SPO2 with pulse oximeter and return demonstration;Exhibits proper breathing techniques, such as pursed lip breathing or other method taught during program session;Demonstrates proper use of MDI's             Oxygen Re-Evaluation:  Oxygen Re-Evaluation     Row Name 04/02/23 0918             Program Oxygen Prescription   Program Oxygen Prescription Continuous       Liters per minute 2         Home Oxygen   Home Oxygen Device Portable Concentrator;Home Concentrator;E-Tanks  E tanks for backup       Sleep Oxygen Prescription Continuous       Liters per minute 2       Home Exercise Oxygen Prescription Continuous       Liters per minute 2       Home Resting Oxygen Prescription Continuous       Liters per minute 2       Compliance with Home Oxygen Use Yes         Goals/Expected Outcomes   Short Term Goals To learn and exhibit compliance with exercise, home and travel O2 prescription;To learn and understand importance of maintaining oxygen saturations>88%;To learn and demonstrate proper use of respiratory medications;To learn and understand importance of monitoring SPO2 with pulse oximeter and demonstrate accurate use of the pulse oximeter.;To learn and demonstrate proper pursed lip breathing techniques or other breathing techniques.        Long  Term Goals Exhibits compliance with exercise, home  and travel O2 prescription;Maintenance of O2 saturations>88%;Compliance with respiratory medication;Verbalizes importance of monitoring SPO2 with pulse oximeter and return  demonstration;Exhibits proper breathing techniques, such as pursed lip breathing or other method taught during program session;Demonstrates  proper use of MDI's       Goals/Expected Outcomes Compliance and understanding of oxygen saturation monitoring and breathing techniques to decrease shortness of breath                Oxygen Discharge (Final Oxygen Re-Evaluation):  Oxygen Re-Evaluation - 04/02/23 0918       Program Oxygen Prescription   Program Oxygen Prescription Continuous    Liters per minute 2      Home Oxygen   Home Oxygen Device Portable Concentrator;Home Concentrator;E-Tanks   E tanks for backup   Sleep Oxygen Prescription Continuous    Liters per minute 2    Home Exercise Oxygen Prescription Continuous    Liters per minute 2    Home Resting Oxygen Prescription Continuous    Liters per minute 2    Compliance with Home Oxygen Use Yes      Goals/Expected Outcomes   Short Term Goals To learn and exhibit compliance with exercise, home and travel O2 prescription;To learn and understand importance of maintaining oxygen saturations>88%;To learn and demonstrate proper use of respiratory medications;To learn and understand importance of monitoring SPO2 with pulse oximeter and demonstrate accurate use of the pulse oximeter.;To learn and demonstrate proper pursed lip breathing techniques or other breathing techniques.     Long  Term Goals Exhibits compliance with exercise, home  and travel O2 prescription;Maintenance of O2 saturations>88%;Compliance with respiratory medication;Verbalizes importance of monitoring SPO2 with pulse oximeter and return demonstration;Exhibits proper breathing techniques, such as pursed lip breathing or other method taught during program session;Demonstrates proper use of MDI's    Goals/Expected Outcomes Compliance and understanding of oxygen saturation monitoring and breathing techniques to decrease shortness of breath             Initial Exercise  Prescription:  Initial Exercise Prescription - 03/27/23 1100       Date of Initial Exercise RX and Referring Provider   Date 03/27/23    Referring Provider Icard    Expected Discharge Date 06/25/23      Oxygen   Oxygen Continuous    Liters 2    Maintain Oxygen Saturation 88% or higher      NuStep   Level 1    SPM 50    Minutes 30    METs 1.5      Prescription Details   Frequency (times per week) 2    Duration Progress to 30 minutes of continuous aerobic without signs/symptoms of physical distress      Intensity   THRR 40-80% of Max Heartrate 61-122    Ratings of Perceived Exertion 11-13    Perceived Dyspnea 0-4      Progression   Progression Continue to progress workloads to maintain intensity without signs/symptoms of physical distress.      Resistance Training   Training Prescription Yes    Weight red bands    Reps 10-15             Perform Capillary Blood Glucose checks as needed.  Exercise Prescription Changes:   Exercise Prescription Changes     Row Name 04/07/23 1500             Response to Exercise   Blood Pressure (Admit) 122/56       Blood Pressure (Exercise) 138/68       Blood Pressure (Exit) 128/70       Heart Rate (Admit) 104 bpm       Heart Rate (Exercise) 108 bpm  Heart Rate (Exit) 106 bpm       Oxygen Saturation (Admit) 95 %       Oxygen Saturation (Exercise) 92 %       Oxygen Saturation (Exit) 98 %       Rating of Perceived Exertion (Exercise) 11       Perceived Dyspnea (Exercise) 1       Duration Continue with 30 min of aerobic exercise without signs/symptoms of physical distress.       Intensity THRR unchanged         Resistance Training   Training Prescription Yes       Weight red bands       Reps 10-15       Time 10 Minutes         Oxygen   Oxygen Continuous       Liters 2         NuStep   Level 1       Minutes 30       METs 1.6         Oxygen   Maintain Oxygen Saturation 88% or higher                 Exercise Comments:   Exercise Comments     Row Name 04/02/23 1601           Exercise Comments Pt completed first day of group exercise. Exercised on the recumbent stepper for 30 min at level 1. Tolerated well. Performed warm up and cool down with intermittent seated breaks. Performed sit to stands in place of squats.                Exercise Goals and Review:   Exercise Goals     Row Name 03/27/23 1050 04/02/23 0917           Exercise Goals   Increase Physical Activity Yes Yes      Intervention Provide advice, education, support and counseling about physical activity/exercise needs.;Develop an individualized exercise prescription for aerobic and resistive training based on initial evaluation findings, risk stratification, comorbidities and participant's personal goals. Provide advice, education, support and counseling about physical activity/exercise needs.;Develop an individualized exercise prescription for aerobic and resistive training based on initial evaluation findings, risk stratification, comorbidities and participant's personal goals.      Expected Outcomes Short Term: Attend rehab on a regular basis to increase amount of physical activity.;Long Term: Exercising regularly at least 3-5 days a week.;Long Term: Add in home exercise to make exercise part of routine and to increase amount of physical activity. Short Term: Attend rehab on a regular basis to increase amount of physical activity.;Long Term: Exercising regularly at least 3-5 days a week.;Long Term: Add in home exercise to make exercise part of routine and to increase amount of physical activity.      Increase Strength and Stamina Yes Yes      Intervention Provide advice, education, support and counseling about physical activity/exercise needs.;Develop an individualized exercise prescription for aerobic and resistive training based on initial evaluation findings, risk stratification, comorbidities and participant's  personal goals. Provide advice, education, support and counseling about physical activity/exercise needs.;Develop an individualized exercise prescription for aerobic and resistive training based on initial evaluation findings, risk stratification, comorbidities and participant's personal goals.      Expected Outcomes Short Term: Increase workloads from initial exercise prescription for resistance, speed, and METs.;Short Term: Perform resistance training exercises routinely during rehab and add in resistance training at home;Long  Term: Improve cardiorespiratory fitness, muscular endurance and strength as measured by increased METs and functional capacity ( ) Short Term: Increase workloads from initial exercise prescription for resistance, speed, and METs.;Short Term: Perform resistance training exercises routinely during rehab and add in resistance training at home;Long Term: Improve cardiorespiratory fitness, muscular endurance and strength as measured by increased METs and functional capacity ( )      Able to understand and use rate of perceived exertion (RPE) scale Yes Yes      Intervention Provide education and explanation on how to use RPE scale Provide education and explanation on how to use RPE scale      Expected Outcomes Short Term: Able to use RPE daily in rehab to express subjective intensity level;Long Term:  Able to use RPE to guide intensity level when exercising independently Short Term: Able to use RPE daily in rehab to express subjective intensity level;Long Term:  Able to use RPE to guide intensity level when exercising independently      Able to understand and use Dyspnea scale Yes Yes      Intervention Provide education and explanation on how to use Dyspnea scale Provide education and explanation on how to use Dyspnea scale      Expected Outcomes Short Term: Able to use Dyspnea scale daily in rehab to express subjective sense of shortness of breath during exertion;Long Term: Able to  use Dyspnea scale to guide intensity level when exercising independently Short Term: Able to use Dyspnea scale daily in rehab to express subjective sense of shortness of breath during exertion;Long Term: Able to use Dyspnea scale to guide intensity level when exercising independently      Knowledge and understanding of Target Heart Rate Range (THRR) Yes Yes      Intervention Provide education and explanation of THRR including how the numbers were predicted and where they are located for reference Provide education and explanation of THRR including how the numbers were predicted and where they are located for reference      Expected Outcomes Short Term: Able to state/look up THRR;Long Term: Able to use THRR to govern intensity when exercising independently;Short Term: Able to use daily as guideline for intensity in rehab Short Term: Able to state/look up THRR;Long Term: Able to use THRR to govern intensity when exercising independently;Short Term: Able to use daily as guideline for intensity in rehab      Understanding of Exercise Prescription Yes Yes      Intervention Provide education, explanation, and written materials on patient's individual exercise prescription Provide education, explanation, and written materials on patient's individual exercise prescription      Expected Outcomes Short Term: Able to explain program exercise prescription;Long Term: Able to explain home exercise prescription to exercise independently Short Term: Able to explain program exercise prescription;Long Term: Able to explain home exercise prescription to exercise independently               Exercise Goals Re-Evaluation :  Exercise Goals Re-Evaluation     Row Name 04/02/23 0917             Exercise Goal Re-Evaluation   Exercise Goals Review Increase Physical Activity;Able to understand and use Dyspnea scale;Understanding of Exercise Prescription;Increase Strength and Stamina;Knowledge and understanding of Target  Heart Rate Range (THRR);Able to understand and use rate of perceived exertion (RPE) scale       Comments Pt will begin exercise today, 8/1. Will progress as tolerated.       Expected Outcomes Through exercise at  rehab and home, the patient will decrease shortness of breath with daily activities and feel confident in carrying out an exercise regimen at home                Discharge Exercise Prescription (Final Exercise Prescription Changes):  Exercise Prescription Changes - 04/07/23 1500       Response to Exercise   Blood Pressure (Admit) 122/56    Blood Pressure (Exercise) 138/68    Blood Pressure (Exit) 128/70    Heart Rate (Admit) 104 bpm    Heart Rate (Exercise) 108 bpm    Heart Rate (Exit) 106 bpm    Oxygen Saturation (Admit) 95 %    Oxygen Saturation (Exercise) 92 %    Oxygen Saturation (Exit) 98 %    Rating of Perceived Exertion (Exercise) 11    Perceived Dyspnea (Exercise) 1    Duration Continue with 30 min of aerobic exercise without signs/symptoms of physical distress.    Intensity THRR unchanged      Resistance Training   Training Prescription Yes    Weight red bands    Reps 10-15    Time 10 Minutes      Oxygen   Oxygen Continuous    Liters 2      NuStep   Level 1    Minutes 30    METs 1.6      Oxygen   Maintain Oxygen Saturation 88% or higher             Nutrition:  Target Goals: Understanding of nutrition guidelines, daily intake of sodium 1500mg , cholesterol 200mg , calories 30% from fat and 7% or less from saturated fats, daily to have 5 or more servings of fruits and vegetables.  Biometrics:  Pre Biometrics - 03/27/23 1134       Pre Biometrics   Grip Strength 21 kg              Nutrition Therapy Plan and Nutrition Goals:  Nutrition Therapy & Goals - 04/02/23 1425       Nutrition Therapy   Diet Heart Healthy Diet      Personal Nutrition Goals   Nutrition Goal Patient to identify strategies for weight maintenance/weight gain  of 0.5-2.0# per week.    Comments Cheryl Haley reports history of weight loss with protein calorie malnutirtion with weight as low as 85# in July of 2023. She is hopeful to gain back to 115-120#. She prefers to eat "whatever" she wants but is receptive to increasing eating frequency to every 2-3 hours and implemented Boost/Ensure 1x/day. Cheryl Haley will continue to benefit from participation in pulmonary rehab for nutrition, exercise, and lifestyle modifications.      Intervention Plan   Intervention Prescribe, educate and counsel regarding individualized specific dietary modifications aiming towards targeted core components such as weight, hypertension, lipid management, diabetes, heart failure and other comorbidities.;Nutrition handout(s) given to patient.    Expected Outcomes Short Term Goal: Understand basic principles of dietary content, such as calories, fat, sodium, cholesterol and nutrients.;Long Term Goal: Adherence to prescribed nutrition plan.             Nutrition Assessments:  MEDIFICTS Score Key: ?70 Need to make dietary changes  40-70 Heart Healthy Diet ? 40 Therapeutic Level Cholesterol Diet   Picture Your Plate Scores: <69 Unhealthy dietary pattern with much room for improvement. 41-50 Dietary pattern unlikely to meet recommendations for good health and room for improvement. 51-60 More healthful dietary pattern, with some room for improvement.  >60 Healthy dietary  pattern, although there may be some specific behaviors that could be improved.    Nutrition Goals Re-Evaluation:  Nutrition Goals Re-Evaluation     Row Name 04/02/23 1425             Goals   Current Weight 105 lb 2.6 oz (47.7 kg)       Comment lipids WNL (treated with repatha)       Expected Outcome Cheryl Haley reports history of weight loss with protein calorie malnutirtion with weight as low as 85# in July of 2023. She is hopeful to gain back to 115-120#. She prefers to eat "whatever" she wants but is receptive  to increasing eating frequency to every 2-3 hours and implemented Boost/Ensure 1x/day. Cheryl Haley will continue to benefit from participation in pulmonary rehab for nutrition, exercise, and lifestyle modifications.                Nutrition Goals Discharge (Final Nutrition Goals Re-Evaluation):  Nutrition Goals Re-Evaluation - 04/02/23 1425       Goals   Current Weight 105 lb 2.6 oz (47.7 kg)    Comment lipids WNL (treated with repatha)    Expected Outcome Cheryl Haley reports history of weight loss with protein calorie malnutirtion with weight as low as 85# in July of 2023. She is hopeful to gain back to 115-120#. She prefers to eat "whatever" she wants but is receptive to increasing eating frequency to every 2-3 hours and implemented Boost/Ensure 1x/day. Edda will continue to benefit from participation in pulmonary rehab for nutrition, exercise, and lifestyle modifications.             Psychosocial: Target Goals: Acknowledge presence or absence of significant depression and/or stress, maximize coping skills, provide positive support system. Participant is able to verbalize types and ability to use techniques and skills needed for reducing stress and depression.  Initial Review & Psychosocial Screening:  Initial Psych Review & Screening - 03/27/23 1045       Initial Review   Current issues with Current Psychotropic Meds   prescribed psychotropic meds for shingles     Family Dynamics   Good Support System? Yes      Barriers   Psychosocial barriers to participate in program There are no identifiable barriers or psychosocial needs.      Screening Interventions   Interventions Encouraged to exercise             Quality of Life Scores:  Scores of 19 and below usually indicate a poorer quality of life in these areas.  A difference of  2-3 points is a clinically meaningful difference.  A difference of 2-3 points in the total score of the Quality of Life Index has been associated  with significant improvement in overall quality of life, self-image, physical symptoms, and general health in studies assessing change in quality of life.  PHQ-9: Review Flowsheet       03/27/2023  Depression screen PHQ 2/9  Decreased Interest 0  Down, Depressed, Hopeless 0  PHQ - 2 Score 0  Altered sleeping 0  Tired, decreased energy 1  Change in appetite 0  Feeling bad or failure about yourself  0  Trouble concentrating 0  Moving slowly or fidgety/restless 0  Suicidal thoughts 0  PHQ-9 Score 1  Difficult doing work/chores Somewhat difficult    Details           Interpretation of Total Score  Total Score Depression Severity:  1-4 = Minimal depression, 5-9 = Mild depression, 10-14 = Moderate  depression, 15-19 = Moderately severe depression, 20-27 = Severe depression   Psychosocial Evaluation and Intervention:  Psychosocial Evaluation - 03/27/23 1047       Psychosocial Evaluation & Interventions   Interventions Encouraged to exercise with the program and follow exercise prescription    Comments Cheryl Haley denies any psychosocial barriers or concerns at this time    Expected Outcomes For Cheryl Haley to participate in PR free of any psychosocial barriers or concerns    Continue Psychosocial Services  No Follow up required             Psychosocial Re-Evaluation:  Psychosocial Re-Evaluation     Row Name 04/03/23 1127             Psychosocial Re-Evaluation   Current issues with None Identified       Comments No changes since orientation. Cheryl Haley has completed 1 class so far       Expected Outcomes For patient to attend PR without any psychosocial barriers or concerns       Interventions Encouraged to attend Pulmonary Rehabilitation for the exercise       Continue Psychosocial Services  No Follow up required                Psychosocial Discharge (Final Psychosocial Re-Evaluation):  Psychosocial Re-Evaluation - 04/03/23 1127       Psychosocial Re-Evaluation    Current issues with None Identified    Comments No changes since orientation. Cheryl Haley has completed 1 class so far    Expected Outcomes For patient to attend PR without any psychosocial barriers or concerns    Interventions Encouraged to attend Pulmonary Rehabilitation for the exercise    Continue Psychosocial Services  No Follow up required             Education: Education Goals: Education classes will be provided on a weekly basis, covering required topics. Participant will state understanding/return demonstration of topics presented.  Learning Barriers/Preferences:  Learning Barriers/Preferences - 03/27/23 1048       Learning Barriers/Preferences   Learning Barriers None    Learning Preferences Group Instruction;Individual Instruction             Education Topics: Introduction to Pulmonary Rehab Group instruction provided by PowerPoint, verbal discussion, and written material to support subject matter. Instructor reviews what Pulmonary Rehab is, the purpose of the program, and how patients are referred.     Know Your Numbers Group instruction that is supported by a PowerPoint presentation. Instructor discusses importance of knowing and understanding resting, exercise, and post-exercise oxygen saturation, heart rate, and blood pressure. Oxygen saturation, heart rate, blood pressure, rating of perceived exertion, and dyspnea are reviewed along with a normal range for these values.    Exercise for the Pulmonary Patient Group instruction that is supported by a PowerPoint presentation. Instructor discusses benefits of exercise, core components of exercise, frequency, duration, and intensity of an exercise routine, importance of utilizing pulse oximetry during exercise, safety while exercising, and options of places to exercise outside of rehab.       MET Level  Group instruction provided by PowerPoint, verbal discussion, and written material to support subject matter.  Instructor reviews what METs are and how to increase METs.    Pulmonary Medications Verbally interactive group education provided by instructor with focus on inhaled medications and proper administration.   Anatomy and Physiology of the Respiratory System Group instruction provided by PowerPoint, verbal discussion, and written material to support subject matter. Instructor reviews respiratory cycle and  anatomical components of the respiratory system and their functions. Instructor also reviews differences in obstructive and restrictive respiratory diseases with examples of each.    Oxygen Safety Group instruction provided by PowerPoint, verbal discussion, and written material to support subject matter. There is an overview of "What is Oxygen" and "Why do we need it".  Instructor also reviews how to create a safe environment for oxygen use, the importance of using oxygen as prescribed, and the risks of noncompliance. There is a brief discussion on traveling with oxygen and resources the patient may utilize.   Oxygen Use Group instruction provided by PowerPoint, verbal discussion, and written material to discuss how supplemental oxygen is prescribed and different types of oxygen supply systems. Resources for more information are provided.    Breathing Techniques Group instruction that is supported by demonstration and informational handouts. Instructor discusses the benefits of pursed lip and diaphragmatic breathing and detailed demonstration on how to perform both.  Flowsheet Row PULMONARY REHAB OTHER RESPIRATORY from 04/02/2023 in [facility] for Heart, Vascular, & Lung Health  Date 04/02/23  Educator RN  Instruction Review Code 1- Verbalizes Understanding        Risk Factor Reduction Group instruction that is supported by a PowerPoint presentation. Instructor discusses the definition of a risk factor, different risk factors for pulmonary disease, and how the heart  and lungs work together.   MD Day A group question and answer session with a medical doctor that allows participants to ask questions that relate to their pulmonary disease state.   Nutrition for the Pulmonary Patient Group instruction provided by PowerPoint slides, verbal discussion, and written materials to support subject matter. The instructor gives an explanation and review of healthy diet recommendations, which includes a discussion on weight management, recommendations for fruit and vegetable consumption, as well as protein, fluid, caffeine, fiber, sodium, sugar, and alcohol. Tips for eating when patients are short of breath are discussed.    Other Education Group or individual verbal, written, or video instructions that support the educational goals of the pulmonary rehab program.    Knowledge Questionnaire Score:  Knowledge Questionnaire Score - 03/27/23 1103       Knowledge Questionnaire Score   Pre Score 13/18             Core Components/Risk Factors/Patient Goals at Admission:  Personal Goals and Risk Factors at Admission - 03/27/23 1048       Core Components/Risk Factors/Patient Goals on Admission    Weight Management Weight Gain;Yes    Intervention Weight Management: Develop a combined nutrition and exercise program designed to reach desired caloric intake, while maintaining appropriate intake of nutrient and fiber, sodium and fats, and appropriate energy expenditure required for the weight goal.;Weight Management: Provide education and appropriate resources to help participant work on and attain dietary goals.;Weight Management/Obesity: Establish reasonable short term and long term weight goals.;Obesity: Provide education and appropriate resources to help participant work on and attain dietary goals.    Admit Weight 105 lb 2.6 oz (47.7 kg)    Expected Outcomes Short Term: Continue to assess and modify interventions until short term weight is achieved;Long Term:  Adherence to nutrition and physical activity/exercise program aimed toward attainment of established weight goal;Weight Gain: Understanding of general recommendations for a high calorie, high protein meal plan that promotes weight gain by distributing calorie intake throughout the day with the consumption for 4-5 meals, snacks, and/or supplements    Improve shortness of breath with ADL's Yes  Intervention Provide education, individualized exercise plan and daily activity instruction to help decrease symptoms of SOB with activities of daily living.    Expected Outcomes Short Term: Improve cardiorespiratory fitness to achieve a reduction of symptoms when performing ADLs;Long Term: Be able to perform more ADLs without symptoms or delay the onset of symptoms    Increase knowledge of respiratory medications and ability to use respiratory devices properly  Yes    Intervention Provide education and demonstration as needed of appropriate use of medications, inhalers, and oxygen therapy.    Expected Outcomes Short Term: Achieves understanding of medications use. Understands that oxygen is a medication prescribed by physician. Demonstrates appropriate use of inhaler and oxygen therapy.;Long Term: Maintain appropriate use of medications, inhalers, and oxygen therapy.    Hypertension Yes    Intervention Provide education on lifestyle modifcations including regular physical activity/exercise, weight management, moderate sodium restriction and increased consumption of fresh fruit, vegetables, and low fat dairy, alcohol moderation, and smoking cessation.;Monitor prescription use compliance.    Expected Outcomes Short Term: Continued assessment and intervention until BP is < 140/50mm HG in hypertensive participants. < 130/70mm HG in hypertensive participants with diabetes, heart failure or chronic kidney disease.;Long Term: Maintenance of blood pressure at goal levels.             Core Components/Risk  Factors/Patient Goals Review:   Goals and Risk Factor Review     Row Name 04/03/23 1129             Core Components/Risk Factors/Patient Goals Review   Personal Goals Review Improve shortness of breath with ADL's;Develop more efficient breathing techniques such as purse lipped breathing and diaphragmatic breathing and practicing self-pacing with activity.;Hypertension;Increase knowledge of respiratory medications and ability to use respiratory devices properly.  Weight Gain       Review Unable to determine progress, Cheryl Haley has completed 1 class so far.       Expected Outcomes See admission goals                Core Components/Risk Factors/Patient Goals at Discharge (Final Review):   Goals and Risk Factor Review - 04/03/23 1129       Core Components/Risk Factors/Patient Goals Review   Personal Goals Review Improve shortness of breath with ADL's;Develop more efficient breathing techniques such as purse lipped breathing and diaphragmatic breathing and practicing self-pacing with activity.;Hypertension;Increase knowledge of respiratory medications and ability to use respiratory devices properly.   Weight Gain   Review Unable to determine progress, Cheryl Haley has completed 1 class so far.    Expected Outcomes See admission goals             ITP Comments:Pt is making expected progress toward Pulmonary Rehab goals after completing 2 sessions. Recommend continued exercise, life style modification, education, and utilization of breathing techniques to increase stamina and strength, while also decreasing shortness of breath with exertion.  Dr. Mechele Collin is Medical Director for Pulmonary Rehab at [facility].     Comments: Dr. Mechele Collin is Medical Director for Pulmonary Rehab at [facility].

## 2023-04-09 ENCOUNTER — Encounter (HOSPITAL_COMMUNITY): Payer: Medicare Other

## 2023-04-09 ENCOUNTER — Encounter (HOSPITAL_COMMUNITY): Payer: Self-pay | Admitting: Internal Medicine

## 2023-04-09 ENCOUNTER — Ambulatory Visit (HOSPITAL_COMMUNITY): Payer: Medicare Other

## 2023-04-14 ENCOUNTER — Encounter (HOSPITAL_COMMUNITY)
Admission: RE | Admit: 2023-04-14 | Discharge: 2023-04-14 | Disposition: A | Discharge status: Home / Self Care | Payer: Medicare Other | Source: Ambulatory Visit | Attending: Pulmonary Disease | Admitting: Pulmonary Disease

## 2023-04-14 ENCOUNTER — Ambulatory Visit (HOSPITAL_COMMUNITY): Payer: Medicare Other

## 2023-04-14 DIAGNOSIS — J9611 Chronic respiratory failure with hypoxia: Secondary | ICD-10-CM

## 2023-04-14 NOTE — Progress Notes (Signed)
Daily Session Note  Patient Details  Name: Cheryl Haley MRN: [id number] Date of Birth: [birth year] Referring Provider:   Doristine Devoid Pulmonary Rehab Walk Test from 03/27/2023 in [facility] for Heart, Vascular, & Lung Health  Referring Provider Icard       Encounter Date: 04/14/2023  Check In:  Session Check In - 04/14/23 1333       Check-In   Supervising physician immediately available to respond to emergencies [facility] MD immediately available    Physician(s) Jari Favre, PA    Location [facility]    Staff Present Raford Pitcher, MS, ACSM-CEP, Exercise Physiologist;Randi Dionisio Paschal, ACSM-CEP, Exercise Physiologist;Samantha Belarus, RD, Dutch Gray, RN, Fuller Plan, RT    Virtual Visit No    Medication changes reported     No    Fall or balance concerns reported    No    Tobacco Cessation No Change    Warm-up and Cool-down Performed as group-led instruction    Resistance Training Performed Yes    VAD Patient? No    PAD/SET Patient? No      Pain Assessment   Currently in Pain? No/denies    Multiple Pain Sites No             Capillary Blood Glucose: No results found for this or any previous visit (from the past 24 hour(s)).    Social History   Tobacco Use  Smoking Status Former   Current packs/day: 0.00   Average packs/day: 2.0 packs/day for 40.0 years (80.0 ttl pk-yrs)   Types: Cigarettes   Start date: 03/16/1977   Quit date: 03/16/2017   Years since quitting: 6.0  Smokeless Tobacco Never  Tobacco Comments        Goals Met:  Proper associated with RPD/PD & O2 Sat Independence with exercise equipment Exercise tolerated well No report of concerns or symptoms today Strength training completed today  Goals Unmet:  Not Applicable  Comments: Service time is from 1317 to 1445.    Dr. Mechele Collin is Medical Director for Pulmonary Rehab at [facility].

## 2023-04-16 ENCOUNTER — Encounter (HOSPITAL_COMMUNITY)
Admission: RE | Admit: 2023-04-16 | Discharge: 2023-04-16 | Disposition: A | Discharge status: Home / Self Care | Payer: Medicare Other | Source: Ambulatory Visit | Attending: Pulmonary Disease | Admitting: Pulmonary Disease

## 2023-04-16 ENCOUNTER — Ambulatory Visit (HOSPITAL_COMMUNITY): Payer: Medicare Other

## 2023-04-16 VITALS — Wt 105.2 lb

## 2023-04-16 DIAGNOSIS — J9611 Chronic respiratory failure with hypoxia: Secondary | ICD-10-CM | POA: Diagnosis not present

## 2023-04-16 NOTE — Progress Notes (Signed)
Daily Session Note  Patient Details  Name: Cheryl Haley MRN: [id number] Date of Birth: 06/23/1956 Referring Provider:   Doristine Devoid Pulmonary Rehab Walk Test from 03/27/2023 in [facility] for Heart, Vascular, & Lung Health  Referring Provider Icard       Encounter Date: 04/16/2023  Check In:  Session Check In - 04/16/23 1327       Check-In   Supervising physician immediately available to respond to emergencies [facility] MD immediately available    Physician(s) Edd Fabian, NP    [facility]    Staff Present Raford Pitcher, MS, ACSM-CEP, Exercise Physiologist;Randi Dionisio Paschal, ACSM-CEP, Exercise Physiologist;Samantha Belarus, RD, Dutch Gray, RN, Fuller Plan, RT    Virtual Visit No    Medication changes reported     No    Fall or balance concerns reported    No    Tobacco Cessation No Change    Warm-up and Cool-down Performed as group-led instruction    Resistance Training Performed Yes    VAD Patient? No    PAD/SET Patient? No      Pain Assessment   Currently in Pain? No/denies    Multiple Pain Sites No             Capillary Blood Glucose: No results found for this or any previous visit (from the past 24 hour(s)).    Social History   Tobacco Use  Smoking Status Former   Current packs/day: 0.00   Average packs/day: 2.0 packs/day for 40.0 years (80.0 ttl pk-yrs)   Types: Cigarettes   Start date: 03/16/1977   Quit date: 03/16/2017   Years since quitting: 6.0  Smokeless Tobacco Never  Tobacco Comments        Goals Met:  Proper associated with RPD/PD & O2 Sat Independence with exercise equipment Exercise tolerated well No report of concerns or symptoms today Strength training completed today  Goals Unmet:  Not Applicable  Comments: Service time is from 1310 to 1445.    Dr. Mechele Collin is Medical Director for Pulmonary Rehab at [facility].

## 2023-04-21 ENCOUNTER — Ambulatory Visit (HOSPITAL_COMMUNITY): Payer: Medicare Other

## 2023-04-21 ENCOUNTER — Encounter (HOSPITAL_COMMUNITY): Payer: Self-pay

## 2023-04-21 ENCOUNTER — Encounter (HOSPITAL_COMMUNITY): Payer: Medicare Other

## 2023-04-23 ENCOUNTER — Encounter (HOSPITAL_COMMUNITY)
Admission: RE | Admit: 2023-04-23 | Discharge: 2023-04-23 | Disposition: A | Discharge status: Home / Self Care | Payer: Medicare Other | Source: Ambulatory Visit | Attending: Pulmonary Disease | Admitting: Pulmonary Disease

## 2023-04-23 ENCOUNTER — Ambulatory Visit (HOSPITAL_COMMUNITY): Payer: Medicare Other

## 2023-04-23 DIAGNOSIS — J9611 Chronic respiratory failure with hypoxia: Secondary | ICD-10-CM

## 2023-04-23 NOTE — Progress Notes (Signed)
Daily Session Note  Patient Details  Name: Cheryl Haley MRN: [id number] Date of Birth: December 26, 1955 Referring Provider:   Doristine Devoid Pulmonary Rehab Walk Test from 03/27/2023 in [facility] for Heart, Vascular, & Lung Health  Referring Provider Icard       Encounter Date: 04/23/2023  Check In:  Session Check In - 04/23/23 1322       Check-In   Supervising physician immediately available to respond to emergencies [facility] MD immediately available    Physician(s) Robin Searing, NP    [facility]    Staff Present Raford Pitcher, MS, ACSM-CEP, Exercise Physiologist;Saxon Crosby Dionisio Paschal, ACSM-CEP, Exercise Physiologist;Samantha Belarus, RD, Dutch Gray, RN, Fuller Plan, RT    Virtual Visit No    Medication changes reported     No    Fall or balance concerns reported    No    Tobacco Cessation No Change    Warm-up and Cool-down Performed as group-led instruction    Resistance Training Performed Yes    VAD Patient? No    PAD/SET Patient? No      Pain Assessment   Currently in Pain? No/denies    Multiple Pain Sites No             Capillary Blood Glucose: No results found for this or any previous visit (from the past 24 hour(s)).    Social History   Tobacco Use  Smoking Status Former   Current packs/day: 0.00   Average packs/day: 2.0 packs/day for 40.0 years (80.0 ttl pk-yrs)   Types: Cigarettes   Start date: 03/16/1977   Quit date: 03/16/2017   Years since quitting: 6.1  Smokeless Tobacco Never  Tobacco Comments        Goals Met:  Exercise tolerated well No report of concerns or symptoms today Strength training completed today  Goals Unmet:  Not Applicable  Comments: Service time is from 1311 to 1446.    Dr. Mechele Collin is Medical Director for Pulmonary Rehab at [facility].

## 2023-04-25 ENCOUNTER — Other Ambulatory Visit: Payer: Self-pay | Admitting: Internal Medicine

## 2023-04-28 ENCOUNTER — Encounter (HOSPITAL_COMMUNITY)
Admission: RE | Admit: 2023-04-28 | Discharge: 2023-04-28 | Disposition: A | Discharge status: Home / Self Care | Payer: Medicare Other | Source: Ambulatory Visit | Attending: Pulmonary Disease | Admitting: Pulmonary Disease

## 2023-04-28 DIAGNOSIS — J9611 Chronic respiratory failure with hypoxia: Secondary | ICD-10-CM | POA: Diagnosis not present

## 2023-04-28 NOTE — Progress Notes (Signed)
Daily Session Note  Patient Details  Name: Cheryl Haley MRN: [id number] Date of Birth: 1955-11-19 Referring Provider:   Doristine Devoid Pulmonary Rehab Walk Test from 03/27/2023 in [facility] for Heart, Vascular, & Lung Health  Referring Provider Icard       Encounter Date: 04/28/2023  Check In:  Session Check In - 04/28/23 4098       Check-In   Supervising physician immediately available to respond to emergencies [facility] MD immediately available    Physician(s) Carlyon Shadow, NP    [facility]    Staff Present Samantha Belarus, RD, Dutch Gray, RN, BSN;Randi Idelle Crouch BS, ACSM-CEP, Exercise Physiologist;Kaylee Earlene Plater, MS, ACSM-CEP, Exercise Physiologist;Carmencita Cusic Katrinka Blazing, RT    Virtual Visit No    Medication changes reported     No    Fall or balance concerns reported    No    Tobacco Cessation No Change    Warm-up and Cool-down Performed as group-led instruction    Resistance Training Performed Yes    VAD Patient? No    PAD/SET Patient? No      Pain Assessment   Currently in Pain? No/denies    Multiple Pain Sites No             Capillary Blood Glucose: No results found for this or any previous visit (from the past 24 hour(s)).    Social History   Tobacco Use  Smoking Status Former   Current packs/day: 0.00   Average packs/day: 2.0 packs/day for 40.0 years (80.0 ttl pk-yrs)   Types: Cigarettes   Start date: 03/16/1977   Quit date: 03/16/2017   Years since quitting: 6.1  Smokeless Tobacco Never  Tobacco Comments        Goals Met:  Proper associated with RPD/PD & O2 Sat Independence with exercise equipment Exercise tolerated well No report of concerns or symptoms today Strength training completed today  Goals Unmet:  Not Applicable  Comments: Service time is from 0812 to [phone].    Dr. Mechele Collin is Medical Director for Pulmonary Rehab at [facility].

## 2023-04-30 ENCOUNTER — Encounter (HOSPITAL_COMMUNITY)
Admission: RE | Admit: 2023-04-30 | Discharge: 2023-04-30 | Disposition: A | Discharge status: Home / Self Care | Payer: Medicare Other | Source: Ambulatory Visit | Attending: Pulmonary Disease | Admitting: Pulmonary Disease

## 2023-04-30 DIAGNOSIS — J9611 Chronic respiratory failure with hypoxia: Secondary | ICD-10-CM

## 2023-04-30 NOTE — Progress Notes (Signed)
Daily Session Note  Patient Details  Name: Cheryl Haley MRN: [id number] Date of Birth: 08/01/1956 Referring Provider:   Doristine Devoid Pulmonary Rehab Walk Test from 03/27/2023 in [facility] for Heart, Vascular, & Lung Health  Referring Provider Icard       Encounter Date: 04/30/2023  Check In:  Session Check In - 04/30/23 0817       Check-In   Supervising physician immediately available to respond to emergencies [facility] MD immediately available    Physician(s) Bernadene Person, NP    [facility]    Staff Present Raford Pitcher, MS, ACSM-CEP, Exercise Physiologist;Casey Katrinka Blazing, RT    Virtual Visit No    Medication changes reported     No    Fall or balance concerns reported    No    Tobacco Cessation No Change    Warm-up and Cool-down Performed as group-led instruction    Resistance Training Performed Yes    VAD Patient? No    PAD/SET Patient? No      Pain Assessment   Currently in Pain? No/denies    Multiple Pain Sites No             Capillary Blood Glucose: No results found for this or any previous visit (from the past 24 hour(s)).    Social History   Tobacco Use  Smoking Status Former   Current packs/day: 0.00   Average packs/day: 2.0 packs/day for 40.0 years (80.0 ttl pk-yrs)   Types: Cigarettes   Start date: 03/16/1977   Quit date: 03/16/2017   Years since quitting: 6.1  Smokeless Tobacco Never  Tobacco Comments        Goals Met:  Proper associated with RPD/PD & O2 Sat Exercise tolerated well No report of concerns or symptoms today Strength training completed today  Goals Unmet:  Not Applicable  Comments: Service time is from 0811 to 0920.    Dr. Mechele Collin is Medical Director for Pulmonary Rehab at [facility].

## 2023-05-05 ENCOUNTER — Encounter (HOSPITAL_COMMUNITY)
Admission: RE | Admit: 2023-05-05 | Discharge: 2023-05-05 | Disposition: A | Discharge status: Home / Self Care | Payer: Medicare Other | Source: Ambulatory Visit | Attending: Pulmonary Disease | Admitting: Pulmonary Disease

## 2023-05-05 VITALS — Wt 106.3 lb

## 2023-05-05 DIAGNOSIS — J9611 Chronic respiratory failure with hypoxia: Secondary | ICD-10-CM | POA: Insufficient documentation

## 2023-05-05 NOTE — Progress Notes (Signed)
Daily Session Note  Patient Details  Name: Keisa Yandyke MRN: [id number] Date of Birth: [birth year] Referring Provider:   Doristine Devoid Pulmonary Rehab Walk Test from 03/27/2023 in [facility]  Referring Provider Icard       Encounter Date: 05/05/2023  Check In:  Session Check In - 05/05/23 0830       Check-In   Supervising physician immediately available to respond to emergencies [facility] MD immediately available    Physician(s) Bernadene Person, NP    Location [facility]    Staff Present Raford Pitcher, MS, ACSM-CEP, Exercise Physiologist;Miasia Crabtree Synthia Innocent, RN, BSN;Randi Reeve BS, ACSM-CEP, Exercise Physiologist    Virtual Visit No    Medication changes reported     No    Fall or balance concerns reported    No    Tobacco Cessation No Change    Warm-up and Cool-down Performed as group-led instruction    Resistance Training Performed Yes    VAD Patient? No      Pain Assessment   Currently in Pain? No/denies    Multiple Pain Sites No             Capillary Blood Glucose: No results found for this or any previous visit (from the past 24 hour(s)).   Exercise Prescription Changes - 05/05/23 0900       Response to Exercise   Blood Pressure (Admit) 116/62    Blood Pressure (Exercise) 148/62    Blood Pressure (Exit) 130/70    Heart Rate (Admit) 76 bpm    Heart Rate (Exercise) 81 bpm    Heart Rate (Exit) 80 bpm    Oxygen Saturation (Admit) 100 %    Oxygen Saturation (Exercise) 97 %    Oxygen Saturation (Exit) 97 %    Rating of Perceived Exertion (Exercise) 13    Perceived Dyspnea (Exercise) 2    Duration Continue with 30 min of aerobic exercise without signs/symptoms of physical distress.    Intensity THRR unchanged      Resistance Training   Training Prescription Yes    Weight red bands    Reps 10-15    Time 10 Minutes      Oxygen   Oxygen Continuous    Liters 2      NuStep    Level 3    Minutes 15    METs 1.8      Track   Laps 8    Minutes 15    METs 2.23      Oxygen   Maintain Oxygen Saturation 88% or higher             Social History   Tobacco Use  Smoking Status Former   Current packs/day: 0.00   Average packs/day: 2.0 packs/day for 40.0 years (80.0 ttl pk-yrs)   Types: Cigarettes   Start date: 03/16/1977   Quit date: 03/16/2017   Years since quitting: 6.1  Smokeless Tobacco Never  Tobacco Comments        Goals Met:  Proper associated with RPD/PD & O2 Sat Independence with exercise equipment Exercise tolerated well No report of concerns or symptoms today Strength training completed today  Goals Unmet:  Not Applicable  Comments: Service time is from 0811 to 0936.    Dr. Mechele Collin is Medical Director for Pulmonary Rehab at [facility].

## 2023-05-06 NOTE — Progress Notes (Signed)
Pulmonary Individual Treatment Plan  Patient Details  Name: Niela Raible MRN: [id number] Date of Birth: 05/18/1956 Referring Provider:   Doristine Devoid Pulmonary Rehab Walk Test from 03/27/2023 in [facility]  Referring Provider Icard       Initial Encounter Date:  Flowsheet Row Pulmonary Rehab Walk Test from 03/27/2023 in [facility]  Date 03/27/23       Visit Diagnosis: Chronic hypoxemic respiratory failure (HCC)  Patient's Home Medications on Admission:   Current Outpatient Medications:    albuterol (PROVENTIL) (2.5 MG/3ML) 0.083% nebulizer solution, Take 3 mLs (2.5 mg total) by nebulization every 6 (six) hours as needed for wheezing or shortness of breath., Disp: 75 mL, Rfl: 5   albuterol (VENTOLIN HFA) 108 (90 Base) MCG/ACT inhaler, Inhale 2 puffs into the lungs every 6 (six) hours as needed for wheezing or shortness of breath., Disp: 18 g, Rfl: 6   alendronate (FOSAMAX) 70 MG tablet, Take 70 mg by mouth every Saturday., Disp: , Rfl:    amitriptyline (ELAVIL) 50 MG tablet, Take 50 mg by mouth at bedtime., Disp: , Rfl:    aspirin EC 81 MG tablet, Take 81 mg daily by mouth. , Disp: , Rfl:    Budeson-Glycopyrrol-Formoterol (BREZTRI AEROSPHERE) 160-9-4.8 MCG/ACT AERO, INHALE 2 PUFFS TWICE DAILY, Disp: 11 g, Rfl: 5   DALIRESP 500 MCG TABS tablet, Take 1 tablet by mouth once daily, Disp: 30 tablet, Rfl: 2   Evolocumab (REPATHA SURECLICK) 140 MG/ML SOAJ, Inject 140 mg into the skin every 14 (fourteen) days., Disp: 2 mL, Rfl: 11   gabapentin (NEURONTIN) 300 MG capsule, Take 300 mg by mouth 2 (two) times daily. (Patient not taking: Reported on 03/27/2023), Disp: , Rfl:    hydroxyurea (HYDREA) 500 MG capsule, TAKE ONE CAPSULE BY MOUTH EVERY OTHER DAY WITH FOOD TO MINIMIZE GI SIDE EFFECTS, Disp: 45 capsule, Rfl: 0   levothyroxine (SYNTHROID) 75 MCG tablet, Take 75 mcg by  mouth every morning., Disp: , Rfl:    metoprolol succinate (TOPROL-XL) 25 MG 24 hr tablet, Take 1 tablet (25 mg total) by mouth daily., Disp: 90 tablet, Rfl: 3   Multiple Vitamin (MULTIVITAMIN WITH MINERALS) TABS tablet, Take 1 tablet by mouth daily., Disp: , Rfl:    Na Sulfate-K Sulfate-Mg Sulf (SUPREP BOWEL PREP KIT) 17.5-3.13-1.6 GM/177ML SOLN, Take 1 kit by mouth as directed. 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     Pneumonia    Protein-calorie malnutrition, severe 09/17/2018   Pulmonary nodules 07/14/2018   07/2017 CT chest >pulmonary nodules noted.  07/2017 PET scan -Hypermetabolic LUL nodule  12/2017 CT chest >decreased nodule size, new pulmonary nodules noted.  FOB -atypical cells  CT chest 07/2018 >stable nodules to smaller on established nodules . 2 new nodules RUL , RLL >CT chest 3 months    Thyroid disease    Tobacco abuse 11/28/2015   Smoked 2 packs of cigarettes daily from teenage years until age 45, quit in 2016    Unspecified  hypothyroidism 09/20/2013   Viral respiratory infection 09/16/2018    Tobacco Use: Social History   Tobacco Use  Smoking Status Former   Current packs/day: 0.00   Average packs/day: 2.0 packs/day for 40.0 years (80.0 ttl pk-yrs)   Types: Cigarettes   Start date: 03/16/1977   Quit date: 03/16/2017   Years since quitting: 6.1  Smokeless Tobacco Never  Tobacco Comments        Labs: Review Flowsheet  More data exists      Latest Ref Rng & Units 03/28/2021 07/01/2021 07/16/2021 09/18/2021 10/27/2022  Labs for ITP Cardiac and Pulmonary Rehab  Cholestrol 100 - 199 mg/dL - - 865  784  696   LDL (calc) 0 - 99 mg/dL - - 79  87  30   HDL-C >39 mg/dL - - 57  59  63   Trlycerides 0 - 149 mg/dL - - 58  58  45   PH, Arterial 7.350 - 7.450 - 7.429  - - -  PCO2 arterial 32.0 - 48.0 mmHg - 41.3  - - -  Bicarbonate 20.0 - 28.0 mmol/L 27.9  26.8  - - -  O2 Saturation % 69.8  98.1  - - -    Details            Capillary Blood Glucose: Lab Results  Component Value Date   GLUCAP 103 (H) 04/06/2023   GLUCAP 101 (H) 06/02/2022   GLUCAP 102 (H) 07/09/2021   GLUCAP 107 (H) 07/14/2017   GLUCAP 202 (H) 09/22/2013     Pulmonary Assessment Scores:  Pulmonary Assessment Scores     Row Name 03/27/23 1057         ADL UCSD   ADL Phase Entry     SOB Score total 74       CAT Score   CAT Score 14       mMRC Score   mMRC Score 4             UCSD: Self-administered rating of dyspnea associated with activities of daily living (ADLs) 6-point scale (0 = "not at all" to 5 = "maximal or unable to do because of breathlessness")  Scoring Scores range from 0 to 120.  Minimally important difference is 5 units  CAT: CAT can identify the health impairment of COPD patients and is better correlated with disease progression.  CAT has a scoring range of zero to 40. The CAT score is classified into four groups of low (less than 10), medium (10 - 20), high (21-30) and very high (31-40) based on  the impact level of disease on health status. A CAT score over 10 suggests significant symptoms.  A worsening CAT score could be explained by an exacerbation, poor medication adherence, poor inhaler technique, or progression of COPD or comorbid  conditions.  CAT MCID is 2 points  mMRC: mMRC (Modified Medical Research Council) Dyspnea Scale is used to assess the degree of baseline functional disability in patients of respiratory disease due to dyspnea. No minimal important difference is established. A decrease in score of 1 point or greater is considered a positive change.   Pulmonary Function Assessment:  Pulmonary Function Assessment - 03/27/23 1055       Breath   Bilateral Breath Sounds Clear;Decreased    Shortness of Breath Yes;Fear of Shortness of Breath;Limiting activity             Exercise Target Goals: Exercise Program Goal: Individual exercise prescription set using results from initial 6 min walk test and THRR while considering  patient's activity barriers and safety.   Exercise Prescription Goal: Initial exercise prescription builds to 30-45 minutes a day of aerobic activity, 2-3 days per week.  Home exercise guidelines will be given to patient during program as part of exercise prescription that the participant will acknowledge.  Activity Barriers & Risk Stratification:  Activity Barriers & Cardiac Risk Stratification - 03/27/23 1050       Activity Barriers & Cardiac Risk Stratification   Activity Barriers Deconditioning;Muscular Weakness;Shortness of Breath    Cardiac Risk Stratification Moderate             6 Minute Walk:  6 Minute Walk     Row Name 03/27/23 1130         6 Minute Walk   Phase Initial     Distance 555 feet     Walk Time 6 minutes     # of Rest Breaks 0     MPH 1.05     METS 2.21     RPE 13     Perceived Dyspnea  2     VO2 Peak 7.74     Symptoms No     Resting HR 94 bpm     Resting BP 132/60     Resting Oxygen Saturation  96 %      Exercise Oxygen Saturation  during 6 min walk 93 %     Max Ex. HR 105 bpm     Max Ex. BP 132/60     2 Minute Post BP 124/60       Interval HR   1 Minute HR 102     2 Minute HR 103     3 Minute HR 102     4 Minute HR 103     5 Minute HR 104     6 Minute HR 105     2 Minute Post HR 97     Interval Heart Rate? Yes       Interval Oxygen   Interval Oxygen? Yes     Baseline Oxygen Saturation % 96 %     1 Minute Oxygen Saturation % 98 %     1 Minute Liters of Oxygen 2 L     2 Minute Oxygen Saturation % 98 %     2 Minute Liters of Oxygen 2 L     3 Minute Oxygen Saturation % 97 %     3 Minute Liters of Oxygen 2 L     4 Minute Oxygen Saturation % 95 %     4 Minute Liters of Oxygen 2 L     5 Minute Oxygen Saturation % 95 %     5 Minute Liters of Oxygen 2 L     6 Minute Oxygen Saturation %  95 %     6 Minute Liters of Oxygen 2 L     2 Minute Post Oxygen Saturation % 93 %     2 Minute Post Liters of Oxygen 2 L              Oxygen Initial Assessment:  Oxygen Initial Assessment - 03/27/23 1051       Home Oxygen   Home Oxygen Device Portable Concentrator;Home Concentrator;E-Tanks   E tanks for backup   Sleep Oxygen Prescription Continuous    Liters per minute 2    Home Exercise Oxygen Prescription Continuous    Liters per minute 2    Home Resting Oxygen Prescription Continuous    Liters per minute 2    Compliance with Home Oxygen Use Yes      Initial 6 min Walk   Oxygen Used Continuous    Liters per minute 2      Program Oxygen Prescription   Program Oxygen Prescription Continuous    Liters per minute 2      Intervention   Short Term Goals To learn and exhibit compliance with exercise, home and travel O2 prescription;To learn and understand importance of maintaining oxygen saturations>88%;To learn and demonstrate proper use of respiratory medications;To learn and understand importance of monitoring SPO2 with pulse oximeter and demonstrate accurate use of the pulse  oximeter.;To learn and demonstrate proper pursed lip breathing techniques or other breathing techniques.     Long  Term Goals Exhibits compliance with exercise, home  and travel O2 prescription;Maintenance of O2 saturations>88%;Compliance with respiratory medication;Verbalizes importance of monitoring SPO2 with pulse oximeter and return demonstration;Exhibits proper breathing techniques, such as pursed lip breathing or other method taught during program session;Demonstrates proper use of MDI's             Oxygen Re-Evaluation:  Oxygen Re-Evaluation     Row Name 04/02/23 0918 04/28/23 1602           Program Oxygen Prescription   Program Oxygen Prescription Continuous Continuous      Liters per minute 2 2        Home Oxygen   Home Oxygen Device Portable Concentrator;Home Concentrator;E-Tanks  E tanks for backup Portable Concentrator;Home Concentrator;E-Tanks  E tanks for backup      Sleep Oxygen Prescription Continuous Continuous      Liters per minute 2 2      Home Exercise Oxygen Prescription Continuous Continuous      Liters per minute 2 2      Home Resting Oxygen Prescription Continuous Continuous      Liters per minute 2 2      Compliance with Home Oxygen Use Yes Yes        Goals/Expected Outcomes   Short Term Goals To learn and exhibit compliance with exercise, home and travel O2 prescription;To learn and understand importance of maintaining oxygen saturations>88%;To learn and demonstrate proper use of respiratory medications;To learn and understand importance of monitoring SPO2 with pulse oximeter and demonstrate accurate use of the pulse oximeter.;To learn and demonstrate proper pursed lip breathing techniques or other breathing techniques.  To learn and exhibit compliance with exercise, home and travel O2 prescription;To learn and understand importance of maintaining oxygen saturations>88%;To learn and demonstrate proper use of respiratory medications;To learn and understand  importance of monitoring SPO2 with pulse oximeter and demonstrate accurate use of the pulse oximeter.;To learn and demonstrate proper pursed lip breathing techniques or other breathing techniques.       Long  Term Goals Exhibits compliance with exercise, home  and travel O2 prescription;Maintenance of O2 saturations>88%;Compliance with respiratory medication;Verbalizes importance of monitoring SPO2 with pulse oximeter and return demonstration;Exhibits proper breathing techniques, such as pursed lip breathing or other method taught during program session;Demonstrates proper use of MDI's Exhibits compliance with exercise, home  and travel O2 prescription;Maintenance of O2 saturations>88%;Compliance with respiratory medication;Verbalizes importance of monitoring SPO2 with pulse oximeter and return demonstration;Exhibits proper breathing techniques, such as pursed lip breathing or other method taught during program session;Demonstrates proper use of MDI's      Goals/Expected Outcomes Compliance and understanding of oxygen saturation monitoring and breathing techniques to decrease shortness of breath Compliance and understanding of oxygen saturation monitoring and breathing techniques to decrease shortness of breath               Oxygen Discharge (Final Oxygen Re-Evaluation):  Oxygen Re-Evaluation - 04/28/23 1602       Program Oxygen Prescription   Program Oxygen Prescription Continuous    Liters per minute 2      Home Oxygen   Home Oxygen Device Portable Concentrator;Home Concentrator;E-Tanks   E tanks for backup   Sleep Oxygen Prescription Continuous    Liters per minute 2    Home Exercise Oxygen Prescription Continuous    Liters per minute 2    Home Resting Oxygen Prescription Continuous    Liters per minute 2    Compliance with Home Oxygen Use Yes      Goals/Expected Outcomes   Short Term Goals To learn and exhibit compliance with exercise, home and travel O2 prescription;To learn and  understand importance of maintaining oxygen saturations>88%;To learn and demonstrate proper use of respiratory medications;To learn and understand importance of monitoring SPO2 with pulse oximeter and demonstrate accurate use of the pulse oximeter.;To learn and demonstrate proper pursed lip breathing techniques or other breathing techniques.     Long  Term Goals Exhibits compliance with exercise, home  and travel O2 prescription;Maintenance of O2 saturations>88%;Compliance with respiratory medication;Verbalizes importance of monitoring SPO2 with pulse oximeter and return demonstration;Exhibits proper breathing techniques, such as pursed lip breathing or other method taught during program session;Demonstrates proper use of MDI's    Goals/Expected Outcomes Compliance and understanding of oxygen saturation monitoring and breathing techniques to decrease shortness of breath             Initial Exercise Prescription:  Initial Exercise Prescription - 03/27/23 1100       Date of Initial Exercise RX and Referring Provider   Date 03/27/23    Referring Provider Icard    Expected Discharge Date 06/25/23      Oxygen   Oxygen Continuous    Liters 2    Maintain Oxygen Saturation 88% or higher      NuStep   Level 1    SPM 50    Minutes 30    METs 1.5      Prescription Details   Frequency (times per week) 2    Duration Progress to 30 minutes of continuous aerobic without signs/symptoms of physical distress      Intensity   THRR 40-80% of Max Heartrate 61-122    Ratings of Perceived Exertion 11-13    Perceived Dyspnea 0-4      Progression   Progression Continue to progress workloads to maintain intensity without signs/symptoms of physical distress.      Resistance Training   Training Prescription Yes    Weight red bands    Reps 10-15  Perform Capillary Blood Glucose checks as needed.  Exercise Prescription Changes:   Exercise Prescription Changes     Row Name  04/07/23 1500 04/16/23 1503 05/05/23 0900         Response to Exercise   Blood Pressure (Admit) 122/56 98/60 116/62     Blood Pressure (Exercise) 138/68 -- 148/62     Blood Pressure (Exit) 128/70 112/68 130/70     Heart Rate (Admit) 104 bpm 86 bpm 76 bpm     Heart Rate (Exercise) 108 bpm 108 bpm 81 bpm     Heart Rate (Exit) 106 bpm 93 bpm 80 bpm     Oxygen Saturation (Admit) 95 % 98 % 100 %     Oxygen Saturation (Exercise) 92 % 89 % 97 %     Oxygen Saturation (Exit) 98 % 94 % 97 %     Rating of Perceived Exertion (Exercise) 11 15 13      Perceived Dyspnea (Exercise) 1 3 2      Duration Continue with 30 min of aerobic exercise without signs/symptoms of physical distress. Continue with 30 min of aerobic exercise without signs/symptoms of physical distress. Continue with 30 min of aerobic exercise without signs/symptoms of physical distress.     Intensity THRR unchanged THRR unchanged THRR unchanged       Resistance Training   Training Prescription Yes Yes Yes     Weight red bands red bands red bands     Reps 10-15 10-15 10-15     Time 10 Minutes 10 Minutes 10 Minutes       Oxygen   Oxygen Continuous Continuous Continuous     Liters 2 2 2        NuStep   Level 1 2 3      Minutes 30 15 15      METs 1.6 1.9 1.8       Track   Laps -- 7 8     Minutes -- 15 15     METs -- 2.08 2.23       Oxygen   Maintain Oxygen Saturation 88% or higher 88% or higher 88% or higher              Exercise Comments:   Exercise Comments     Row Name 04/02/23 1601           Exercise Comments Pt completed first day of group exercise. Exercised on the recumbent stepper for 30 min at level 1. Tolerated well. Performed warm up and cool down with intermittent seated breaks. Performed sit to stands in place of squats.                Exercise Goals and Review:   Exercise Goals     Row Name 03/27/23 1050 04/02/23 0917 04/28/23 1551         Exercise Goals   Increase Physical Activity Yes  Yes Yes     Intervention Provide advice, education, support and counseling about physical activity/exercise needs.;Develop an individualized exercise prescription for aerobic and resistive training based on initial evaluation findings, risk stratification, comorbidities and participant's personal goals. Provide advice, education, support and counseling about physical activity/exercise needs.;Develop an individualized exercise prescription for aerobic and resistive training based on initial evaluation findings, risk stratification, comorbidities and participant's personal goals. Provide advice, education, support and counseling about physical activity/exercise needs.;Develop an individualized exercise prescription for aerobic and resistive training based on initial evaluation findings, risk stratification, comorbidities and participant's personal goals.     Expected Outcomes  Short Term: Attend rehab on a regular basis to increase amount of physical activity.;Long Term: Exercising regularly at least 3-5 days a week.;Long Term: Add in home exercise to make exercise part of routine and to increase amount of physical activity. Short Term: Attend rehab on a regular basis to increase amount of physical activity.;Long Term: Exercising regularly at least 3-5 days a week.;Long Term: Add in home exercise to make exercise part of routine and to increase amount of physical activity. Short Term: Attend rehab on a regular basis to increase amount of physical activity.;Long Term: Exercising regularly at least 3-5 days a week.;Long Term: Add in home exercise to make exercise part of routine and to increase amount of physical activity.     Increase Strength and Stamina Yes Yes Yes     Intervention Provide advice, education, support and counseling about physical activity/exercise needs.;Develop an individualized exercise prescription for aerobic and resistive training based on initial evaluation findings, risk stratification,  comorbidities and participant's personal goals. Provide advice, education, support and counseling about physical activity/exercise needs.;Develop an individualized exercise prescription for aerobic and resistive training based on initial evaluation findings, risk stratification, comorbidities and participant's personal goals. Provide advice, education, support and counseling about physical activity/exercise needs.;Develop an individualized exercise prescription for aerobic and resistive training based on initial evaluation findings, risk stratification, comorbidities and participant's personal goals.     Expected Outcomes Short Term: Increase workloads from initial exercise prescription for resistance, speed, and METs.;Short Term: Perform resistance training exercises routinely during rehab and add in resistance training at home;Long Term: Improve cardiorespiratory fitness, muscular endurance and strength as measured by increased METs and functional capacity ( ) Short Term: Increase workloads from initial exercise prescription for resistance, speed, and METs.;Short Term: Perform resistance training exercises routinely during rehab and add in resistance training at home;Long Term: Improve cardiorespiratory fitness, muscular endurance and strength as measured by increased METs and functional capacity ( ) Short Term: Increase workloads from initial exercise prescription for resistance, speed, and METs.;Short Term: Perform resistance training exercises routinely during rehab and add in resistance training at home;Long Term: Improve cardiorespiratory fitness, muscular endurance and strength as measured by increased METs and functional capacity ( )     Able to understand and use rate of perceived exertion (RPE) scale Yes Yes Yes     Intervention Provide education and explanation on how to use RPE scale Provide education and explanation on how to use RPE scale Provide education and explanation on how to use RPE  scale     Expected Outcomes Short Term: Able to use RPE daily in rehab to express subjective intensity level;Long Term:  Able to use RPE to guide intensity level when exercising independently Short Term: Able to use RPE daily in rehab to express subjective intensity level;Long Term:  Able to use RPE to guide intensity level when exercising independently Short Term: Able to use RPE daily in rehab to express subjective intensity level;Long Term:  Able to use RPE to guide intensity level when exercising independently     Able to understand and use Dyspnea scale Yes Yes Yes     Intervention Provide education and explanation on how to use Dyspnea scale Provide education and explanation on how to use Dyspnea scale Provide education and explanation on how to use Dyspnea scale     Expected Outcomes Short Term: Able to use Dyspnea scale daily in rehab to express subjective sense of shortness of breath during exertion;Long Term: Able to use Dyspnea scale to guide intensity level  when exercising independently Short Term: Able to use Dyspnea scale daily in rehab to express subjective sense of shortness of breath during exertion;Long Term: Able to use Dyspnea scale to guide intensity level when exercising independently Short Term: Able to use Dyspnea scale daily in rehab to express subjective sense of shortness of breath during exertion;Long Term: Able to use Dyspnea scale to guide intensity level when exercising independently     Knowledge and understanding of Target Heart Rate Range (THRR) Yes Yes Yes     Intervention Provide education and explanation of THRR including how the numbers were predicted and where they are located for reference Provide education and explanation of THRR including how the numbers were predicted and where they are located for reference Provide education and explanation of THRR including how the numbers were predicted and where they are located for reference     Expected Outcomes Short Term:  Able to state/look up THRR;Long Term: Able to use THRR to govern intensity when exercising independently;Short Term: Able to use daily as guideline for intensity in rehab Short Term: Able to state/look up THRR;Long Term: Able to use THRR to govern intensity when exercising independently;Short Term: Able to use daily as guideline for intensity in rehab Short Term: Able to state/look up THRR;Long Term: Able to use THRR to govern intensity when exercising independently;Short Term: Able to use daily as guideline for intensity in rehab     Understanding of Exercise Prescription Yes Yes Yes     Intervention Provide education, explanation, and written materials on patient's individual exercise prescription Provide education, explanation, and written materials on patient's individual exercise prescription Provide education, explanation, and written materials on patient's individual exercise prescription     Expected Outcomes Short Term: Able to explain program exercise prescription;Long Term: Able to explain home exercise prescription to exercise independently Short Term: Able to explain program exercise prescription;Long Term: Able to explain home exercise prescription to exercise independently Short Term: Able to explain program exercise prescription;Long Term: Able to explain home exercise prescription to exercise independently              Exercise Goals Re-Evaluation :  Exercise Goals Re-Evaluation     Row Name 04/02/23 0917 04/28/23 1551           Exercise Goal Re-Evaluation   Exercise Goals Review Increase Physical Activity;Able to understand and use Dyspnea scale;Understanding of Exercise Prescription;Increase Strength and Stamina;Knowledge and understanding of Target Heart Rate Range (THRR);Able to understand and use rate of perceived exertion (RPE) scale Increase Physical Activity;Able to understand and use Dyspnea scale;Understanding of Exercise Prescription;Increase Strength and  Stamina;Knowledge and understanding of Target Heart Rate Range (THRR);Able to understand and use rate of perceived exertion (RPE) scale      Comments Pt will begin exercise today, 8/1. Will progress as tolerated. Ahlaam has completed 6 exercise sessions. She exercises for 15 min on the [place] and track. Mattea averages 1.8 METs at level 2 on the [place] and 2.08 METs on the track. Kristalyn have increased her level on the [place] as METs have increased. Tassy has also progressed to walking the track for 15 min. She feels that her functional capacity is improving since starting PR. Will continue to monitor and progreess as able.      Expected Outcomes Through exercise at rehab and home, the patient will decrease shortness of breath with daily activities and feel confident in carrying out an exercise regimen at home Through exercise at rehab and home, the patient will decrease  shortness of breath with daily activities and feel confident in carrying out an exercise regimen at home               Discharge Exercise Prescription (Final Exercise Prescription Changes):  Exercise Prescription Changes - 05/05/23 0900       Response to Exercise   Blood Pressure (Admit) 116/62    Blood Pressure (Exercise) 148/62    Blood Pressure (Exit) 130/70    Heart Rate (Admit) 76 bpm    Heart Rate (Exercise) 81 bpm    Heart Rate (Exit) 80 bpm    Oxygen Saturation (Admit) 100 %    Oxygen Saturation (Exercise) 97 %    Oxygen Saturation (Exit) 97 %    Rating of Perceived Exertion (Exercise) 13    Perceived Dyspnea (Exercise) 2    Duration Continue with 30 min of aerobic exercise without signs/symptoms of physical distress.    Intensity THRR unchanged      Resistance Training   Training Prescription Yes    Weight red bands    Reps 10-15    Time 10 Minutes      Oxygen   Oxygen Continuous    Liters 2      NuStep   Level 3    Minutes 15    METs 1.8      Track   Laps 8    Minutes 15    METs 2.23       Oxygen   Maintain Oxygen Saturation 88% or higher             Nutrition:  Target Goals: Understanding of nutrition guidelines, daily intake of sodium 1500mg , cholesterol 200mg , calories 30% from fat and 7% or less from saturated fats, daily to have 5 or more servings of fruits and vegetables.  Biometrics:  Pre Biometrics - 03/27/23 1134       Pre Biometrics   Grip Strength 21 kg              Nutrition Therapy Plan and Nutrition Goals:  Nutrition Therapy & Goals - 05/05/23 1052       Nutrition Therapy   Diet Heart Healthy Diet      Personal Nutrition Goals   Nutrition Goal Patient to identify strategies for weight maintenance/weight gain of 0.5-2.0# per week.   goal in progress.   Comments Goals in action. Kymesha reports history of weight loss with protein calorie malnutirtion with weight as low as 85# in July of 2023. She is hopeful to gain back to 115-120#. She prefers to eat "whatever" she wants but is receptive to increasing eating frequency to every 2-3 hours and implemented Boost/Ensure 1x/day. She is up 1.1# since starting with our program. Jacqeline will continue to benefit from participation in pulmonary rehab for nutrition, exercise, and lifestyle modifications.      Intervention Plan   Intervention Prescribe, educate and counsel regarding individualized specific dietary modifications aiming towards targeted core components such as weight, hypertension, lipid management, diabetes, heart failure and other comorbidities.;Nutrition handout(s) given to patient.    Expected Outcomes Short Term Goal: Understand basic principles of dietary content, such as calories, fat, sodium, cholesterol and nutrients.;Long Term Goal: Adherence to prescribed nutrition plan.             Nutrition Assessments:  MEDIFICTS Score Key: ?70 Need to make dietary changes  40-70 Heart Healthy Diet ? 40 Therapeutic Level Cholesterol Diet   Picture Your Plate Scores: <27 Unhealthy  dietary pattern with much room for  improvement. 41-50 Dietary pattern unlikely to meet recommendations for good health and room for improvement. 51-60 More healthful dietary pattern, with some room for improvement.  >60 Healthy dietary pattern, although there may be some specific behaviors that could be improved.    Nutrition Goals Re-Evaluation:  Nutrition Goals Re-Evaluation     Row Name 04/02/23 1425 05/05/23 1052           Goals   Current Weight 105 lb 2.6 oz (47.7 kg) 106 lb 4.2 oz (48.2 kg)      Comment lipids WNL (treated with repatha) lipids WNL (treated with repatha)      Expected Outcome Phillis reports history of weight loss with protein calorie malnutirtion with weight as low as 85# in July of 2023. She is hopeful to gain back to 115-120#. She prefers to eat "whatever" she wants but is receptive to increasing eating frequency to every 2-3 hours and implemented Boost/Ensure 1x/day. Amour will continue to benefit from participation in pulmonary rehab for nutrition, exercise, and lifestyle modifications. Goals in action. Romy reports history of weight loss with protein calorie malnutirtion with weight as low as 85# in July of 2023. She is hopeful to gain back to 115-120#. She prefers to eat "whatever" she wants but is receptive to increasing eating frequency to every 2-3 hours and implemented Boost/Ensure 1x/day. She is up 1.1# since starting with our program. Matylda will continue to benefit from participation in pulmonary rehab for nutrition, exercise, and lifestyle modifications.               Nutrition Goals Discharge (Final Nutrition Goals Re-Evaluation):  Nutrition Goals Re-Evaluation - 05/05/23 1052       Goals   Current Weight 106 lb 4.2 oz (48.2 kg)    Comment lipids WNL (treated with repatha)    Expected Outcome Goals in action. Cynde reports history of weight loss with protein calorie malnutirtion with weight as low as 85# in July of 2023. She is hopeful to gain  back to 115-120#. She prefers to eat "whatever" she wants but is receptive to increasing eating frequency to every 2-3 hours and implemented Boost/Ensure 1x/day. She is up 1.1# since starting with our program. Eisley will continue to benefit from participation in pulmonary rehab for nutrition, exercise, and lifestyle modifications.             Psychosocial: Target Goals: Acknowledge presence or absence of significant depression and/or stress, maximize coping skills, provide positive support system. Participant is able to verbalize types and ability to use techniques and skills needed for reducing stress and depression.  Initial Review & Psychosocial Screening:  Initial Psych Review & Screening - 03/27/23 1045       Initial Review   Current issues with Current Psychotropic Meds   prescribed psychotropic meds for shingles     Family Dynamics   Good Support System? Yes      Barriers   Psychosocial barriers to participate in program There are no identifiable barriers or psychosocial needs.      Screening Interventions   Interventions Encouraged to exercise             Quality of Life Scores:  Scores of 19 and below usually indicate a poorer quality of life in these areas.  A difference of  2-3 points is a clinically meaningful difference.  A difference of 2-3 points in the total score of the Quality of Life Index has been associated with significant improvement in overall quality of life, self-image,  physical symptoms, and general health in studies assessing change in quality of life.  PHQ-9: Review Flowsheet       03/27/2023  Depression screen PHQ 2/9  Decreased Interest 0  Down, Depressed, Hopeless 0  PHQ - 2 Score 0  Altered sleeping 0  Tired, decreased energy 1  Change in appetite 0  Feeling bad or failure about yourself  0  Trouble concentrating 0  Moving slowly or fidgety/restless 0  Suicidal thoughts 0  PHQ-9 Score 1  Difficult doing work/chores Somewhat  difficult    Details           Interpretation of Total Score  Total Score Depression Severity:  1-4 = Minimal depression, 5-9 = Mild depression, 10-14 = Moderate depression, 15-19 = Moderately severe depression, 20-27 = Severe depression   Psychosocial Evaluation and Intervention:  Psychosocial Evaluation - 03/27/23 1047       Psychosocial Evaluation & Interventions   Interventions Encouraged to exercise with the program and follow exercise prescription    Comments Lariza denies any psychosocial barriers or concerns at this time    Expected Outcomes For Wally to participate in PR free of any psychosocial barriers or concerns    Continue Psychosocial Services  No Follow up required             Psychosocial Re-Evaluation:  Psychosocial Re-Evaluation     Row Name 04/03/23 1127 04/29/23 1503           Psychosocial Re-Evaluation   Current issues with None Identified None Identified      Comments No changes since orientation. Vyla has completed 1 class so far Carney Bern denies any psychosocial barriers or concerns.      Expected Outcomes For patient to attend PR without any psychosocial barriers or concerns For Carney Bern to continue to attend PR without any psychosocial barriers or concerns      Interventions Encouraged to attend Pulmonary Rehabilitation for the exercise Encouraged to attend Pulmonary Rehabilitation for the exercise      Continue Psychosocial Services  No Follow up required No Follow up required               Psychosocial Discharge (Final Psychosocial Re-Evaluation):  Psychosocial Re-Evaluation - 04/29/23 1503       Psychosocial Re-Evaluation   Current issues with None Identified    Comments Carney Bern denies any psychosocial barriers or concerns.    Expected Outcomes For Carney Bern to continue to attend PR without any psychosocial barriers or concerns    Interventions Encouraged to attend Pulmonary Rehabilitation for the exercise    Continue Psychosocial Services  No  Follow up required             Education: Education Goals: Education classes will be provided on a weekly basis, covering required topics. Participant will state understanding/return demonstration of topics presented.  Learning Barriers/Preferences:  Learning Barriers/Preferences - 03/27/23 1048       Learning Barriers/Preferences   Learning Barriers None    Learning Preferences Group Instruction;Individual Instruction             Education Topics: Introduction to Pulmonary Rehab Group instruction provided by PowerPoint, verbal discussion, and written material to support subject matter. Instructor reviews what Pulmonary Rehab is, the purpose of the program, and how patients are referred.     Know Your Numbers Group instruction that is supported by a PowerPoint presentation. Instructor discusses importance of knowing and understanding resting, exercise, and post-exercise oxygen saturation, heart rate, and blood pressure. Oxygen saturation, heart  rate, blood pressure, rating of perceived exertion, and dyspnea are reviewed along with a normal range for these values.    Exercise for the Pulmonary Patient Group instruction that is supported by a PowerPoint presentation. Instructor discusses benefits of exercise, core components of exercise, frequency, duration, and intensity of an exercise routine, importance of utilizing pulse oximetry during exercise, safety while exercising, and options of places to exercise outside of rehab.       MET Level  Group instruction provided by PowerPoint, verbal discussion, and written material to support subject matter. Instructor reviews what METs are and how to increase METs.  Flowsheet Row PULMONARY REHAB OTHER RESPIRATORY from 04/30/2023 in [facility]  Date 04/30/23  Educator EP  Instruction Review Code 1- Verbalizes Understanding       Pulmonary Medications Verbally  interactive group education provided by instructor with focus on inhaled medications and proper administration.   Anatomy and Physiology of the Respiratory System Group instruction provided by PowerPoint, verbal discussion, and written material to support subject matter. Instructor reviews respiratory cycle and anatomical components of the respiratory system and their functions. Instructor also reviews differences in obstructive and restrictive respiratory diseases with examples of each.    Oxygen Safety Group instruction provided by PowerPoint, verbal discussion, and written material to support subject matter. There is an overview of "What is Oxygen" and "Why do we need it".  Instructor also reviews how to create a safe environment for oxygen use, the importance of using oxygen as prescribed, and the risks of noncompliance. There is a brief discussion on traveling with oxygen and resources the patient may utilize.   Oxygen Use Group instruction provided by PowerPoint, verbal discussion, and written material to discuss how supplemental oxygen is prescribed and different types of oxygen supply systems. Resources for more information are provided.    Breathing Techniques Group instruction that is supported by demonstration and informational handouts. Instructor discusses the benefits of pursed lip and diaphragmatic breathing and detailed demonstration on how to perform both.  Flowsheet Row PULMONARY REHAB OTHER RESPIRATORY from 04/02/2023 in [facility]  Date 04/02/23  Educator RN  Instruction Review Code 1- Verbalizes Understanding        Risk Factor Reduction Group instruction that is supported by a PowerPoint presentation. Instructor discusses the definition of a risk factor, different risk factors for pulmonary disease, and how the heart and lungs work together. Flowsheet Row PULMONARY REHAB OTHER RESPIRATORY from 04/23/2023 in  [facility]  Date 04/23/23  Educator EP  Instruction Review Code 1- Verbalizes Understanding       MD Day A group question and answer session with a medical doctor that allows participants to ask questions that relate to their pulmonary disease state.   Nutrition for the Pulmonary Patient Group instruction provided by PowerPoint slides, verbal discussion, and written materials to support subject matter. The instructor gives an explanation and review of healthy diet recommendations, which includes a discussion on weight management, recommendations for fruit and vegetable consumption, as well as protein, fluid, caffeine, fiber, sodium, sugar, and alcohol. Tips for eating when patients are short of breath are discussed.    Other Education Group or individual verbal, written, or video instructions that support the educational goals of the pulmonary rehab program. Flowsheet Row PULMONARY REHAB OTHER RESPIRATORY from 04/16/2023 in [facility]  Date 04/16/23  Educator RN  Instruction Review Code 1- Verbalizes Understanding        Knowledge Questionnaire Score:  Knowledge Questionnaire Score - 03/27/23 1103       Knowledge Questionnaire Score   Pre Score 13/18             Core Components/Risk Factors/Patient Goals at Admission:  Personal Goals and Risk Factors at Admission - 03/27/23 1048       Core Components/Risk Factors/Patient Goals on Admission    Weight Management Weight Gain;Yes    Intervention Weight Management: Develop a combined nutrition and exercise program designed to reach desired caloric intake, while maintaining appropriate intake of nutrient and fiber, sodium and fats, and appropriate energy expenditure required for the weight goal.;Weight Management: Provide education and appropriate resources to help participant work on and attain dietary  goals.;Weight Management/Obesity: Establish reasonable short term and long term weight goals.;Obesity: Provide education and appropriate resources to help participant work on and attain dietary goals.    Admit Weight 105 lb 2.6 oz (47.7 kg)    Expected Outcomes Short Term: Continue to assess and modify interventions until short term weight is achieved;Long Term: Adherence to nutrition and physical activity/exercise program aimed toward attainment of established weight goal;Weight Gain: Understanding of general recommendations for a high calorie, high protein meal plan that promotes weight gain by distributing calorie intake throughout the day with the consumption for 4-5 meals, snacks, and/or supplements    Improve shortness of breath with ADL's Yes    Intervention Provide education, individualized exercise plan and daily activity instruction to help decrease symptoms of SOB with activities of daily living.    Expected Outcomes Short Term: Improve cardiorespiratory fitness to achieve a reduction of symptoms when performing ADLs;Long Term: Be able to perform more ADLs without symptoms or delay the onset of symptoms    Increase knowledge of respiratory medications and ability to use respiratory devices properly  Yes    Intervention Provide education and demonstration as needed of appropriate use of medications, inhalers, and oxygen therapy.    Expected Outcomes Short Term: Achieves understanding of medications use. Understands that oxygen is a medication prescribed by physician. Demonstrates appropriate use of inhaler and oxygen therapy.;Long Term: Maintain appropriate use of medications, inhalers, and oxygen therapy.    Hypertension Yes    Intervention Provide education on lifestyle modifcations including regular physical activity/exercise, weight management, moderate sodium restriction and increased consumption of fresh fruit, vegetables, and low fat dairy, alcohol moderation, and smoking  cessation.;Monitor prescription use compliance.    Expected Outcomes Short Term: Continued assessment and intervention until BP is < 140/22mm HG in hypertensive participants. < 130/48mm HG in hypertensive participants with diabetes, heart failure or chronic kidney disease.;Long Term: Maintenance of blood pressure at goal levels.             Core Components/Risk Factors/Patient Goals Review:   Goals and Risk Factor Review     Row Name 04/03/23 1129 04/29/23 1504           Core Components/Risk Factors/Patient Goals Review   Personal Goals Review Improve shortness of breath with ADL's;Develop more efficient breathing techniques such as purse lipped breathing and diaphragmatic breathing and practicing self-pacing with activity.;Hypertension;Increase knowledge of respiratory medications and ability to use respiratory devices properly.  Weight Gain Improve shortness of breath with ADL's;Develop more efficient breathing techniques such as purse lipped breathing and diaphragmatic breathing and practicing self-pacing with activity.;Hypertension;Increase knowledge of respiratory medications  and ability to use respiratory devices properly.;Weight Management/Obesity  Weight Gain      Review Unable to determine progress, Casundra has completed 1 class so far. Goal in progress for weight gain. Carney Bern has not had success yet in gaining weight but is still working with our dietician to increase meal frequency and add high calorie protein shakes. Goal in progress on developing more efficient breathing techniques such as purse lipped breathing and diaphragmatic breathing; and practicing self-pacing with activity. We have been working & teaching Carney Bern on how to perform PLB and pacing herself while walking the track. Goal in progress on improving her shortness of breath with ADLs. Carney Bern has maintained her oxygen saturation >88% while exercising on 2L [place]. Goal met for hypertension control. For the past 6 sessions Jean's  blood pressures have been WNL. She is compliant in taking her Toprol-XL for BP control. Goal also met for increasing her knowledge of respiratory medications and ability to use respiratory devices properly. She has correctly demonstrated and voiced when to use her medications with our respiratory therapist. She will continue to benefit from PR for nutrition, education, exercise, and lifestyle modification.      Expected Outcomes See admission goals For Carney Bern to gain weight, improve her shortness of breath with ADLs, to develop more efficient breathing techniques, and increase her knowledge of respiratory medications.               Core Components/Risk Factors/Patient Goals at Discharge (Final Review):   Goals and Risk Factor Review - 04/29/23 1504       Core Components/Risk Factors/Patient Goals Review   Personal Goals Review Improve shortness of breath with ADL's;Develop more efficient breathing techniques such as purse lipped breathing and diaphragmatic breathing and practicing self-pacing with activity.;Hypertension;Increase knowledge of respiratory medications and ability to use respiratory devices properly.;Weight Management/Obesity   Weight Gain   Review Goal in progress for weight gain. Carney Bern has not had success yet in gaining weight but is still working with our dietician to increase meal frequency and add high calorie protein shakes. Goal in progress on developing more efficient breathing techniques such as purse lipped breathing and diaphragmatic breathing; and practicing self-pacing with activity. We have been working & teaching Carney Bern on how to perform PLB and pacing herself while walking the track. Goal in progress on improving her shortness of breath with ADLs. Carney Bern has maintained her oxygen saturation >88% while exercising on 2L [place]. Goal met for hypertension control. For the past 6 sessions Jean's blood pressures have been WNL. She is compliant in taking her Toprol-XL for BP control. Goal  also met for increasing her knowledge of respiratory medications and ability to use respiratory devices properly. She has correctly demonstrated and voiced when to use her medications with our respiratory therapist. She will continue to benefit from PR for nutrition, education, exercise, and lifestyle modification.    Expected Outcomes For Carney Bern to gain weight, improve her shortness of breath with ADLs, to develop more efficient breathing techniques, and increase her knowledge of respiratory medications.             ITP Comments:Pt is making expected progress toward Pulmonary Rehab goals after completing 8 sessions. Recommend continued exercise, life style modification, education, and utilization of breathing techniques to increase stamina and strength, while also decreasing shortness of breath with exertion.  Dr. Mechele Collin is Medical Director for Pulmonary Rehab at [facility].     Comments: Dr. Mechele Collin is Medical Director for Pulmonary Rehab  at [facility].

## 2023-05-07 ENCOUNTER — Encounter (HOSPITAL_COMMUNITY)
Admission: RE | Admit: 2023-05-07 | Discharge: 2023-05-07 | Disposition: A | Discharge status: Home / Self Care | Payer: Medicare Other | Source: Ambulatory Visit | Attending: Pulmonary Disease | Admitting: Pulmonary Disease

## 2023-05-07 DIAGNOSIS — J9611 Chronic respiratory failure with hypoxia: Secondary | ICD-10-CM

## 2023-05-07 NOTE — Progress Notes (Signed)
Daily Session Note  Patient Details  Name: Cheryl Haley MRN: [id number] Date of Birth: 1955/10/29 Referring Provider:   Doristine Devoid Pulmonary Rehab Walk Test from 03/27/2023 in [facility]  Referring Provider Icard       Encounter Date: 05/07/2023  Check In:  Session Check In - 05/07/23 9518       Check-In   Supervising physician immediately available to respond to emergencies [facility] MD immediately available    Physician(s) Elizabeth Sauer, NP    Location [facility]    Staff Present Raford Pitcher, MS, ACSM-CEP, Exercise Physiologist;Casey Synthia Innocent, RN, BSN;Randi Reeve BS, ACSM-CEP, Exercise Physiologist;Samantha Belarus, RD, LDN    Virtual Visit No    Medication changes reported     No    Fall or balance concerns reported    No    Tobacco Cessation No Change    Warm-up and Cool-down Performed as group-led instruction    Resistance Training Performed Yes    VAD Patient? No    PAD/SET Patient? No      Pain Assessment   Currently in Pain? No/denies             Capillary Blood Glucose: No results found for this or any previous visit (from the past 24 hour(s)).    Social History   Tobacco Use  Smoking Status Former   Current packs/day: 0.00   Average packs/day: 2.0 packs/day for 40.0 years (80.0 ttl pk-yrs)   Types: Cigarettes   Start date: 03/16/1977   Quit date: 03/16/2017   Years since quitting: 6.1  Smokeless Tobacco Never  Tobacco Comments        Goals Met:  Independence with exercise equipment Exercise tolerated well No report of concerns or symptoms today Strength training completed today  Goals Unmet:  Not Applicable  Comments: Service time is from 0823 to 0906    Dr. Mechele Collin is Medical Director for Pulmonary Rehab at [facility].

## 2023-05-07 NOTE — Progress Notes (Signed)
Home Exercise Prescription I have reviewed a Home Exercise Prescription with Cheryl Haley is currently exercising at home. She is walking/ doing cool down exercises 5 days/wk for 30 min/day. I encouraged Cheryl Haley to focus more on the walking than cooldown exercises. Explained the difference between aerobic and resistance exercise. Cheryl Haley showed understanding of my explanation. Cheryl Haley is motivated to exercise as she told me she was walking before she started PR. I encouraged Cheryl Haley to continue walking at home. I am confident Cheryl Haley completed an exercise regimen at home. The patient stated that their goals were to decrease her shortness of breath. We reviewed exercise guidelines, target heart rate during exercise, RPE Scale, weather conditions, endpoints for exercise, warmup and cool down. The patient is encouraged to come to me with any questions. I will continue to follow up with the patient to assist them with progression and safety. Spent 15 min with patient discussing home exercise plan and goals  Joya San, MS, ACSM-CEP 05/07/2023 3:53 PM

## 2023-05-12 ENCOUNTER — Encounter (HOSPITAL_COMMUNITY)
Admission: RE | Admit: 2023-05-12 | Discharge: 2023-05-12 | Disposition: A | Discharge status: Home / Self Care | Payer: Medicare Other | Source: Ambulatory Visit | Attending: Pulmonary Disease | Admitting: Pulmonary Disease

## 2023-05-12 DIAGNOSIS — J9611 Chronic respiratory failure with hypoxia: Secondary | ICD-10-CM

## 2023-05-12 NOTE — Progress Notes (Signed)
Daily Session Note  Patient Details  Name: Cheryl Haley MRN: [id number] Date of Birth: 09-23-1955 Referring Provider:   Doristine Devoid Pulmonary Rehab Walk Test from 03/27/2023 in [facility]  Referring Provider Icard       Encounter Date: 05/12/2023  Check In:  Session Check In - 05/12/23 1884       Check-In   Supervising physician immediately available to respond to emergencies [facility] immediately available    Physician(s) Joni Reining, NP    [facility]    Staff Present Essie Hart, RN, BSN;Randi Idelle Crouch BS, ACSM-CEP, Exercise Physiologist;Kaylee Earlene Plater, MS, ACSM-CEP, Exercise Physiologist;Melayna Robarts Katrinka Blazing, RT    Virtual Visit No    Medication changes reported     No    Fall or balance concerns reported    No    Tobacco Cessation No Change    Warm-up and Cool-down Performed as group-led instruction    Resistance Training Performed Yes    VAD Patient? No    PAD/SET Patient? No      Pain Assessment   Currently in Pain? No/denies    Multiple Pain Sites No             Capillary Blood Glucose: No results found for this or any previous visit (from the past 24 hour(s)).    Social History   Tobacco Use  Smoking Status Former   Current packs/day: 0.00   Average packs/day: 2.0 packs/day for 40.0 years (80.0 ttl pk-yrs)   Types: Cigarettes   Start date: 03/16/1977   Quit date: 03/16/2017   Years since quitting: 6.1  Smokeless Tobacco Never  Tobacco Comments        Goals Met:  Proper associated with RPD/PD & O2 Sat Independence with exercise equipment Exercise tolerated well No report of concerns or symptoms today Strength training completed today  Goals Unmet:  Not Applicable  Comments: Service time is from 0802 to [phone].    Dr. Mechele Collin is Medical Director for Pulmonary Rehab at [facility].

## 2023-05-14 ENCOUNTER — Encounter (HOSPITAL_COMMUNITY)
Admission: RE | Admit: 2023-05-14 | Discharge: 2023-05-14 | Disposition: A | Discharge status: Home / Self Care | Payer: Medicare Other | Source: Ambulatory Visit | Attending: Pulmonary Disease | Admitting: Pulmonary Disease

## 2023-05-14 DIAGNOSIS — J9611 Chronic respiratory failure with hypoxia: Secondary | ICD-10-CM | POA: Diagnosis not present

## 2023-05-14 NOTE — Progress Notes (Signed)
Daily Session Note  Patient Details  Name: Cheryl Haley MRN: [id number] Date of Birth: 06-22-1956 Referring Provider:   Doristine Devoid Pulmonary Rehab Walk Test from 03/27/2023 in [facility]  Referring Provider Icard       Encounter Date: 05/14/2023  Check In:  Session Check In - 05/14/23 0857       Check-In   Supervising physician immediately available to respond to emergencies [facility] immediately available    Physician(s) Edd Fabian, NP    [facility]    Staff Present Essie Hart, RN, BSN;Jariah Jarmon Idelle Crouch BS, ACSM-CEP, Exercise Physiologist;Kaylee Earlene Plater, MS, ACSM-CEP, Exercise Physiologist;Casey Katrinka Blazing, RT    Virtual Visit No    Medication changes reported     No    Fall or balance concerns reported    No    Tobacco Cessation No Change    Warm-up and Cool-down Performed as group-led instruction    Resistance Training Performed Yes    VAD Patient? No    PAD/SET Patient? No      Pain Assessment   Currently in Pain? No/denies    Pain Score 0-No pain    Multiple Pain Sites No             Capillary Blood Glucose: No results found for this or any previous visit (from the past 24 hour(s)).    Social History   Tobacco Use  Smoking Status Former   Current packs/day: 0.00   Average packs/day: 2.0 packs/day for 40.0 years (80.0 ttl pk-yrs)   Types: Cigarettes   Start date: 03/16/1977   Quit date: 03/16/2017   Years since quitting: 6.1  Smokeless Tobacco Never  Tobacco Comments        Goals Met:  Independence with exercise equipment Improved SOB with ADL's Exercise tolerated well No report of concerns or symptoms today Strength training completed today  Goals Unmet:  Not Applicable  Comments: Service time is from 0815 to [phone].    Dr. Mechele Collin is Medical Director for Pulmonary Rehab at [facility].

## 2023-05-19 ENCOUNTER — Encounter (HOSPITAL_COMMUNITY)
Admission: RE | Admit: 2023-05-19 | Discharge: 2023-05-19 | Disposition: A | Discharge status: Home / Self Care | Payer: Medicare Other | Source: Ambulatory Visit | Attending: Pulmonary Disease | Admitting: Pulmonary Disease

## 2023-05-19 DIAGNOSIS — J9611 Chronic respiratory failure with hypoxia: Secondary | ICD-10-CM | POA: Diagnosis not present

## 2023-05-19 NOTE — Progress Notes (Signed)
Daily Session Note  Patient Details  Name: Adahlia Patillo MRN: [id number] Date of Birth: 1956-03-26 Referring Provider:   Doristine Devoid Pulmonary Rehab Walk Test from 03/27/2023 in [facility]  Referring Provider Icard       Encounter Date: 05/19/2023  Check In:  Session Check In - 05/19/23 0826       Check-In   Supervising physician immediately available to respond to emergencies [facility] immediately available    Physician(s) Jari Favre, NP    [facility]    Staff Present Essie Hart, RN, BSN;Randi Idelle Crouch BS, ACSM-CEP, Exercise Physiologist;Amadeo Coke Earlene Plater, MS, ACSM-CEP, Exercise Physiologist;Casey Katrinka Blazing, RT    Virtual Visit No    Medication changes reported     No    Fall or balance concerns reported    No    Tobacco Cessation No Change    Warm-up and Cool-down Performed as group-led instruction    Resistance Training Performed Yes    VAD Patient? No    PAD/SET Patient? No      Pain Assessment   Currently in Pain? No/denies    Multiple Pain Sites No             Capillary Blood Glucose: No results found for this or any previous visit (from the past 24 hour(s)).   Exercise Prescription Changes - 05/19/23 0900       Response to Exercise   Blood Pressure (Admit) 130/60    Blood Pressure (Exercise) 142/68    Blood Pressure (Exit) 128/64    Heart Rate (Admit) 84 bpm    Heart Rate (Exercise) 100 bpm    Heart Rate (Exit) 87 bpm    Oxygen Saturation (Admit) 96 %    Oxygen Saturation (Exercise) 92 %    Oxygen Saturation (Exit) 99 %    Rating of Perceived Exertion (Exercise) 13    Perceived Dyspnea (Exercise) 3    Duration Continue with 30 min of aerobic exercise without signs/symptoms of physical distress.    Intensity THRR unchanged      Progression   Progression Continue to progress workloads to maintain intensity without signs/symptoms of physical distress.      Resistance  Training   Training Prescription Yes    Weight red bands    Reps 10-15    Time 10 Minutes      Oxygen   Oxygen Continuous    Liters 2      NuStep   Level 3    Minutes 15    METs 2.3      Track   Laps 8    Minutes 15    METs 2.23      Oxygen   Maintain Oxygen Saturation 88% or higher             Social History   Tobacco Use  Smoking Status Former   Current packs/day: 0.00   Average packs/day: 2.0 packs/day for 40.0 years (80.0 ttl pk-yrs)   Types: Cigarettes   Start date: 03/16/1977   Quit date: 03/16/2017   Years since quitting: 6.1  Smokeless Tobacco Never  Tobacco Comments        Goals Met:  Proper associated with RPD/PD & O2 Sat Independence with exercise equipment Exercise tolerated well No report of concerns or symptoms today Strength training completed today  Goals Unmet:  Not Applicable  Comments: Service time is from 0811 to 0924.    Dr. Mechele Collin is  Medical Director for Pulmonary Rehab at [facility].

## 2023-05-20 ENCOUNTER — Ambulatory Visit (HOSPITAL_BASED_OUTPATIENT_CLINIC_OR_DEPARTMENT_OTHER)
Admission: RE | Admit: 2023-05-20 | Discharge: 2023-05-20 | Disposition: A | Discharge status: Home / Self Care | Payer: Medicare Other | Source: Ambulatory Visit | Attending: Adult Health | Admitting: Adult Health

## 2023-05-20 DIAGNOSIS — J189 Pneumonia, unspecified organism: Secondary | ICD-10-CM | POA: Diagnosis present

## 2023-05-20 DIAGNOSIS — R918 Other nonspecific abnormal finding of lung field: Secondary | ICD-10-CM | POA: Diagnosis present

## 2023-05-21 ENCOUNTER — Encounter (HOSPITAL_COMMUNITY)
Admission: RE | Admit: 2023-05-21 | Discharge: 2023-05-21 | Disposition: A | Discharge status: Home / Self Care | Payer: Medicare Other | Source: Ambulatory Visit | Attending: Pulmonary Disease | Admitting: Pulmonary Disease

## 2023-05-21 DIAGNOSIS — J9611 Chronic respiratory failure with hypoxia: Secondary | ICD-10-CM

## 2023-05-21 NOTE — Progress Notes (Signed)
Daily Session Note  Patient Details  Name: Cheryl Haley MRN: [id number] Date of Birth: [birth year] Referring Provider:   Doristine Devoid Pulmonary Rehab Walk Test from 03/27/2023 in [facility]  Referring Provider Icard       Encounter Date: 05/21/2023  Check In:  Session Check In - 05/21/23 1191       Check-In   Supervising physician immediately available to respond to emergencies [facility] immediately available    Physician(s) Jari Favre, NP    [facility]    Staff Present Essie Hart, RN, BSN;Randi Idelle Crouch BS, ACSM-CEP, Exercise Physiologist;Kaylee Earlene Plater, MS, ACSM-CEP, Exercise Physiologist;Joss Friedel Katrinka Blazing, RT    Virtual Visit No    Medication changes reported     No    Fall or balance concerns reported    No    Tobacco Cessation No Change    Warm-up and Cool-down Performed as group-led instruction    Resistance Training Performed Yes    VAD Patient? No    PAD/SET Patient? No      Pain Assessment   Currently in Pain? No/denies    Multiple Pain Sites No             Capillary Blood Glucose: No results found for this or any previous visit (from the past 24 hour(s)).    Social History   Tobacco Use  Smoking Status Former   Current packs/day: 0.00   Average packs/day: 2.0 packs/day for 40.0 years (80.0 ttl pk-yrs)   Types: Cigarettes   Start date: 03/16/1977   Quit date: 03/16/2017   Years since quitting: 6.1  Smokeless Tobacco Never  Tobacco Comments        Goals Met:  Proper associated with RPD/PD & O2 Sat Independence with exercise equipment Exercise tolerated well No report of concerns or symptoms today Strength training completed today  Goals Unmet:  Not Applicable  Comments: Service time is from 0811 to 0932.    Dr. Mechele Collin is Medical Director for Pulmonary Rehab at [facility].

## 2023-05-26 ENCOUNTER — Encounter (HOSPITAL_COMMUNITY)
Admission: RE | Admit: 2023-05-26 | Discharge: 2023-05-26 | Disposition: A | Discharge status: Home / Self Care | Payer: Medicare Other | Source: Ambulatory Visit | Attending: Pulmonary Disease | Admitting: Pulmonary Disease

## 2023-05-26 DIAGNOSIS — J9611 Chronic respiratory failure with hypoxia: Secondary | ICD-10-CM | POA: Diagnosis not present

## 2023-05-26 NOTE — Progress Notes (Signed)
Daily Session Note  Patient Details  Name: Cheryl Haley MRN: [id number] Date of Birth: 06-17-1956 Referring Provider:   Doristine Devoid Pulmonary Rehab Walk Test from 03/27/2023 in [facility]  Referring Provider Icard       Encounter Date: 05/26/2023  Check In:  Session Check In - 05/26/23 5784       Check-In   Supervising physician immediately available to respond to emergencies [facility] immediately available    Physician(s) Carlyon Shadow, NP    Location [facility]    Staff Present Essie Hart, RN, BSN;Randi Idelle Crouch BS, ACSM-CEP, Exercise Physiologist;Mishelle Hassan Earlene Plater, MS, ACSM-CEP, Exercise Physiologist;Casey Katrinka Blazing, RT    Virtual Visit No    Medication changes reported     No    Fall or balance concerns reported    No    Tobacco Cessation No Change    Warm-up and Cool-down Performed as group-led instruction    Resistance Training Performed Yes    VAD Patient? No    PAD/SET Patient? No      Pain Assessment   Currently in Pain? No/denies    Multiple Pain Sites No             Capillary Blood Glucose: No results found for this or any previous visit (from the past 24 hour(s)).    Social History   Tobacco Use  Smoking Status Former   Current packs/day: 0.00   Average packs/day: 2.0 packs/day for 40.0 years (80.0 ttl pk-yrs)   Types: Cigarettes   Start date: 03/16/1977   Quit date: 03/16/2017   Years since quitting: 6.1  Smokeless Tobacco Never  Tobacco Comments        Goals Met:  Proper associated with RPD/PD & O2 Sat Independence with exercise equipment Exercise tolerated well No report of concerns or symptoms today Strength training completed today  Goals Unmet:  Not Applicable  Comments: Service time is from 0816 to 0929.    Dr. Mechele Collin is Medical Director for Pulmonary Rehab at [facility].

## 2023-05-27 ENCOUNTER — Inpatient Hospital Stay: Payer: Medicare Other

## 2023-05-27 ENCOUNTER — Ambulatory Visit: Payer: Medicare Other | Admitting: Family

## 2023-05-28 ENCOUNTER — Encounter: Payer: Self-pay | Admitting: Adult Health

## 2023-05-28 ENCOUNTER — Ambulatory Visit: Payer: Medicare Other | Admitting: Adult Health

## 2023-05-28 ENCOUNTER — Encounter (HOSPITAL_COMMUNITY)
Admission: RE | Admit: 2023-05-28 | Discharge: 2023-05-28 | Disposition: A | Discharge status: Home / Self Care | Payer: Medicare Other | Source: Ambulatory Visit | Attending: Pulmonary Disease | Admitting: Pulmonary Disease

## 2023-05-28 VITALS — BP 98/60 | HR 105 | Temp 97.6°F | Ht 64.0 in | Wt 105.0 lb

## 2023-05-28 DIAGNOSIS — E43 Unspecified severe protein-calorie malnutrition: Secondary | ICD-10-CM | POA: Diagnosis not present

## 2023-05-28 DIAGNOSIS — Z23 Encounter for immunization: Secondary | ICD-10-CM

## 2023-05-28 DIAGNOSIS — R918 Other nonspecific abnormal finding of lung field: Secondary | ICD-10-CM | POA: Diagnosis not present

## 2023-05-28 DIAGNOSIS — J9611 Chronic respiratory failure with hypoxia: Secondary | ICD-10-CM

## 2023-05-28 DIAGNOSIS — J449 Chronic obstructive pulmonary disease, unspecified: Secondary | ICD-10-CM | POA: Diagnosis not present

## 2023-05-28 DIAGNOSIS — R911 Solitary pulmonary nodule: Secondary | ICD-10-CM

## 2023-05-28 NOTE — Assessment & Plan Note (Signed)
Continue on oxygen to maintain O2 saturations greater than 88 to 90%  Plan  Patient Instructions  Continue on BREZTRI 2 puffs Twice daily  , rinse after use.  Albuterol inhaler/neb As needed   Flutter valve daily.  Continue on Prednisone 5mg  daily  Continue on Daliresp daily  Continue on Oxygen 2l/m at rest and 3 L with activity Activity as tolerated.  High protein diet .  I will call with CT results.  Flu shot today  Follow up with Dr. Tonia Brooms or Doreena Maulden NP in 4 months and As needed

## 2023-05-28 NOTE — Patient Instructions (Addendum)
Continue on BREZTRI 2 puffs Twice daily  , rinse after use.  Albuterol inhaler/neb As needed   Flutter valve daily.  Continue on Prednisone 5mg  daily  Continue on Daliresp daily  Continue on Oxygen 2l/m at rest and 3 L with activity Activity as tolerated.  High protein diet .  I will call with CT results.  Flu shot today  Follow up with Dr. Tonia Brooms or Moise Friday NP in 4 months and As needed

## 2023-05-28 NOTE — Progress Notes (Signed)
Daily Session Note  Patient Details  Name: Cheryl Haley MRN: [id number] Date of Birth: 18-Nov-1955 Referring Provider:   Doristine Devoid Pulmonary Rehab Walk Test from 03/27/2023 in [facility]  Referring Provider Icard       Encounter Date: 05/28/2023  Check In:  Session Check In - 05/28/23 7829       Check-In   Supervising physician immediately available to respond to emergencies [facility] immediately available    Physician(s) Edd Fabian, NP    Location [facility]    Staff Present Essie Hart, RN, BSN;Randi Idelle Crouch BS, ACSM-CEP, Exercise Physiologist;Antavious Spanos Earlene Plater, MS, ACSM-CEP, Exercise Physiologist;Casey Hermine Messick Belarus, RD, LDN    Virtual Visit No    Medication changes reported     No    Fall or balance concerns reported    No    Tobacco Cessation No Change    Warm-up and Cool-down Performed as group-led instruction    Resistance Training Performed Yes    VAD Patient? No    PAD/SET Patient? No      Pain Assessment   Currently in Pain? No/denies             Capillary Blood Glucose: No results found for this or any previous visit (from the past 24 hour(s)).    Social History   Tobacco Use  Smoking Status Former   Current packs/day: 0.00   Average packs/day: 2.0 packs/day for 40.0 years (80.0 ttl pk-yrs)   Types: Cigarettes   Start date: 03/16/1977   Quit date: 03/16/2017   Years since quitting: 6.2  Smokeless Tobacco Never  Tobacco Comments        Goals Met:  Proper associated with RPD/PD & O2 Sat Independence with exercise equipment Exercise tolerated well No report of concerns or symptoms today Strength training completed today  Goals Unmet:  Not Applicable  Comments: Service time is from 0806 to 0935.    Dr. Mechele Collin is Medical Director for Pulmonary Rehab at [facility].

## 2023-05-28 NOTE — Progress Notes (Signed)
@Patient  ID: Cheryl Haley, female    DOB: 1955/09/19, 67 y.o.   MRN: [id number]  Chief Complaint  Patient presents with   Follow-up    Referring provider: [facility], Ginette Otto *  HPI: 67 year old female followed for COPD and chronic respiratory failure and lung nodules On chronic steroids since July 2022  TEST/EVENTS :  Chest Imaging- films reviewed: CT chest 06/28/2019- severe centrilobular emphysema throughout both lungs.  Left upper lobe spiculated nodule associated with linear scar.  Right middle lobe spiculated nodule.  Airway thickening.  Scarring in right greater than left lower lobes.  No significant mediastinal or hilar adenopathy.  Nodules stable or smaller than previous CT scans.   CT cardiac scoring 10/07/2019- redemonstrated right middle lobe nodule.  No new concerning nodules.   Interval near complete resolution of previously demonstrated lingular infiltrate, consistent with resolving pneumonia. 2. The 3 suspicious right lung nodules seen on the most recent study persist, and based on multiplicity could be postinflammatory.   PET scan 07/09/2021- Moderate metabolic activity associated with irregular nodules clustered in the RIGHT upper lobe. Favor post inflammatory or infectious process. Cannot exclude neoplasm. Recommend tissue sampling versus CT surveillance. 2. Persistent but decreased activity in LEFT upper lobe parenchymal lesion.   Navigational bronchoscopy August 28, 2021.  Cytology was negative for malignancy.  Cultures were negative.    CT chest Jan 10, 2022 showed minimally progressed right upper lobe consolidation and new nodular opacities along the lateral aspect of this consolidation questionable mucoid impaction, previous pulmonary nodules stable    PET scan 06/2022 -Hypermetabolism corresponding to posterior right upper lobe area of partially cavitary consolidation. Morphology and growth rate over multiple prior exams favor an infectious or  inflammatory etiology. Neoplasm felt less likely.Bilateral small pulmonary nodules with low-level hypermetabolism, primarily to the prior PET of 07/09/2021 and favored to be benign. Right hilar hypermetabolism without adenopathy is also favored to be reactive  CT chest December 05, 2022 decrease size of right upper lobe consolidation consistent with residual scarring, irregular solid pulmonary nodules left lower lobe measuring 6 mm and 4 mm.   CT chest Jan 26, 2023 negative for PE, patchy nodular consolidation right lung apex and previous small pulmonary nodules have resolved.     Pulmonary Functions Testing Results: Spirometry 2015: FVC  2.0 (63%) FEV1  0.8 (33%) Ratio 41  pulmonary function testing that was done today that showed very severe airflow obstruction with FEV1 at 24%, ratio 38, FVC 50% and DLCO at 28%.    10/25/2019 SPECT scan: EF 63%, low risk study.  No EKG changes during stress.  Alpha-1 MS   05/28/23 Follow up : COPD, O2 RF, Lung nodules  Patient returns for 41-month follow-up.  She has underlying very severe COPD and oxygen dependent respiratory failure.  Since last visit patient says she is doing about the same.  Gets short of breath with minimum activities.  She is currently participating in pulmonary rehab.  She does feel that this has made some difference and feels like she is doing well in the program.  She remains on Breztri inhaler twice daily.  She is on chronic steroids with prednisone 5 mg daily.  She also takes Daliresp daily.  She remains on oxygen 2 L at rest and 3 L with activity.  She has an appetite about the same.  Weight is currently at 105 pounds with a BMI of 18. She has a known history of pulmonary nodules she underwent navigational bronchoscopy in December 2022 cytology was  negative for malignancy.  She had a another repeat PET scan in October 2023 that showed hypermetabolic activity in the right upper lobe but felt that this area favored a inflammatory  etiology neoplasm was felt less likely.  Bilateral pulmonary nodules similar to prior PET in November 2022 with low level of hypermetabolism.  And right hilar hypermetabolism without adenopathy favored to be reactive.  Patient is continued with serial CT. most recent CT chest May 20, 2023 showed stable size of the right upper lobe nodule with adjacent fiducials.  Right lung apex density slightly larger but reduced in thickness compatible with improving inflammatory.    Allergies  Allergen Reactions   Bisoprolol Fumarate Other (See Comments)    Other reaction(s): heart issues    Immunization History  Administered Date(s) Administered   Fluad Quad(high Dose 65+) 06/06/2020, 05/10/2021, 06/05/2022   Fluad Trivalent(High Dose 65+) 05/28/2023   Influenza Split 05/31/2015, 06/07/2018, 05/09/2019   Influenza,inj,Quad PF,6+ Mos 09/22/2013, 06/10/2016, 06/25/2017, 04/30/2018, 06/01/2019   Influenza,inj,quad, With Preservative 09/07/2015   PFIZER(Purple Top)SARS-COV-2 Vaccination 11/27/2019, 12/18/2019   Pneumococcal Conjugate-13 04/17/2015   Pneumococcal Polysaccharide-23 12/08/2016   Tdap 04/10/2017    Past Medical History:  Diagnosis Date   Agatston coronary artery calcium score greater than 400    score 678   Anxiety    Arthritis    "in neck"   COPD (chronic obstructive pulmonary disease) (HCC)    Diabetes (HCC)    Dyspnea    Emphysema lung (HCC)    Essential thrombocythemia (HCC) 09/15/2019   History of hiatal hernia    Hx of TIA (transient ischemic attack) and stroke    Hyperlipidemia    Hypertension    Hypothyroidism    Impaired fasting glucose    Pleural effusion 05/15/2014   CXR 05/2014:  New small right effusion.     Pneumonia    Protein-calorie malnutrition, severe 09/17/2018   Pulmonary nodules 07/14/2018   07/2017 CT chest >pulmonary nodules noted.  07/2017 PET scan -Hypermetabolic LUL nodule  12/2017 CT chest >decreased nodule size, new pulmonary nodules noted.   FOB -atypical cells  CT chest 07/2018 >stable nodules to smaller on established nodules . 2 new nodules RUL , RLL >CT chest 3 months    Thyroid disease    Tobacco abuse 11/28/2015   Smoked 2 packs of cigarettes daily from teenage years until age 89, quit in 2016    Unspecified hypothyroidism 09/20/2013   Viral respiratory infection 09/16/2018    Tobacco History: Social History   Tobacco Use  Smoking Status Former   Current packs/day: 0.00   Average packs/day: 2.0 packs/day for 40.0 years (80.0 ttl pk-yrs)   Types: Cigarettes   Start date: 03/16/1977   Quit date: 03/16/2017   Years since quitting: 6.2  Smokeless Tobacco Never  Tobacco Comments       Counseling given: Not Answered Tobacco comments:     Outpatient Medications Prior to Visit  Medication Sig Dispense Refill   albuterol (PROVENTIL) (2.5 MG/3ML) 0.083% nebulizer solution Take 3 mLs (2.5 mg total) by nebulization every 6 (six) hours as needed for wheezing or shortness of breath. 75 mL 5   albuterol (VENTOLIN HFA) 108 (90 Base) MCG/ACT inhaler Inhale 2 puffs into the lungs every 6 (six) hours as needed for wheezing or shortness of breath. 18 g 6   alendronate (FOSAMAX) 70 MG tablet Take 70 mg by mouth every Saturday.     amitriptyline (ELAVIL) 50 MG tablet Take 50 mg by mouth at bedtime.  aspirin EC 81 MG tablet Take 81 mg daily by mouth.      Budeson-Glycopyrrol-Formoterol (BREZTRI AEROSPHERE) 160-9-4.8 MCG/ACT AERO INHALE 2 PUFFS TWICE DAILY 11 g 5   DALIRESP 500 MCG TABS tablet Take 1 tablet by mouth once daily 30 tablet 2   Evolocumab (REPATHA SURECLICK) 140 MG/ML SOAJ Inject 140 mg into the skin every 14 (fourteen) days. 2 mL 11   gabapentin (NEURONTIN) 300 MG capsule Take 300 mg by mouth 2 (two) times daily.     hydroxyurea (HYDREA) 500 MG capsule TAKE ONE CAPSULE BY MOUTH EVERY OTHER DAY WITH FOOD TO MINIMIZE GI SIDE EFFECTS 45 capsule 0   levothyroxine (SYNTHROID) 75 MCG tablet Take 75 mcg by mouth every  morning.     metoprolol succinate (TOPROL-XL) 25 MG 24 hr tablet Take 1 tablet (25 mg total) by mouth daily. 90 tablet 3   Multiple Vitamin (MULTIVITAMIN WITH MINERALS) TABS tablet Take 1 tablet by mouth daily.     Na Sulfate-K Sulfate-Mg Sulf (SUPREP BOWEL PREP KIT) 17.5-3.13-1.6 GM/177ML SOLN Take 1 kit by mouth as directed. For colonoscopy prep 354 mL 0   OXYGEN Inhale 2 L into the lungs continuous.     predniSONE (DELTASONE) 5 MG tablet Take 1 tablet by mouth once daily with breakfast 30 tablet 1   roflumilast (DALIRESP) 500 MCG TABS tablet Take 1 tablet (500 mcg total) by mouth daily. 30 tablet 11   rosuvastatin (CRESTOR) 40 MG tablet Take 1 tablet by mouth once daily 90 tablet 3   No facility-administered medications prior to visit.     Review of Systems:   Constitutional:   No  weight loss, night sweats,  Fevers, chills, + fatigue, or  lassitude.  HEENT:   No headaches,  Difficulty swallowing,  Tooth/dental problems, or  Sore throat,                No sneezing, itching, ear ache, nasal congestion, post nasal drip,   CV:  No chest pain,  Orthopnea, PND, swelling in lower extremities, anasarca, dizziness, palpitations, syncope.   GI  No heartburn, indigestion, abdominal pain, nausea, vomiting, diarrhea, change in bowel habits, loss of appetite, bloody stools.   Resp: .  No chest wall deformity  Skin: no rash or lesions.  GU: no dysuria, change in color of urine, no urgency or frequency.  No flank pain, no hematuria   MS:  No joint pain or swelling.  No decreased range of motion.  No back pain.    Physical Exam  BP 98/60   Pulse (!) 105   Temp 97.6 F (36.4 C) (Oral)   Ht 5\' 4"  (1.626 m)   Wt 105 lb (47.6 kg)   SpO2 95%   BMI 18.02 kg/m   GEN: A/Ox3; pleasant , NAD, well nourished, elderly  O2    HEENT:  [place]/AT,  EACs-clear, TMs-wnl, NOSE-clear, THROAT-clear, no lesions, no postnasal drip or exudate noted.   NECK:  Supple w/ fair ROM; no JVD; normal carotid  impulses w/o bruits; no thyromegaly or nodules palpated; no lymphadenopathy.    RESP  Clear  P & A; w/o, wheezes/ rales/ or rhonchi. no accessory muscle use, no dullness to percussion  CARD:  RRR, no m/r/g, tr  peripheral edema, pulses intact, no cyanosis or clubbing.  GI:   Soft & nt; nml bowel sounds; no organomegaly or masses detected.   Musco: Warm bil, no deformities or joint swelling noted.   Neuro: alert, no focal deficits noted.  Skin: Warm, no lesions or rashes    Lab Results:     Imaging: CT Chest Wo Contrast  Result Date: 05/28/2023 CLINICAL DATA:  Pulmonary nodules and pneumonia EXAM: CT CHEST WITHOUT CONTRAST TECHNIQUE: Multidetector CT imaging of the chest was performed following the standard protocol without IV contrast. RADIATION DOSE REDUCTION: This exam was performed according to the departmental dose-optimization program which includes automated exposure control, adjustment of the mA and/or kV according to patient size and/or use of iterative reconstruction technique. COMPARISON:  01/26/2023 FINDINGS: Cardiovascular: Coronary, aortic arch, and branch vessel atherosclerotic vascular disease. Mitral valve calcification. Small pericardial effusion reduced in size from prior. Mediastinum/Nodes: Unremarkable Lungs/Pleura: Emphysema. Confluent density posteriorly at the right lung apex 2.1 by 1.4 cm on image 16 series 302, formerly 1.8 by 1.4 cm. Reduced thickness of density along the volume loss posteriorly in the right upper lobe for example on image 32 series 302 compatible with improving inflammation. Fiducials adjacent to a right upper lobe nodule measuring 1.5 by 0.5 cm on on image 40 series 302, unchanged. Upper Abdomen: Gas anterior to the right hepatic lobe does not appear to be free-flowing to non dependent portions the abdomen and accordingly is presumed to be in featureless bowel on image 132 series 301. Abdominal aortic atherosclerosis with atheromatous vascular  calcifications proximally in the celiac trunk and SMA. Musculoskeletal: Mild thoracic spondylosis. Posterior intervertebral spurring at multiple vertebral levels in the thoracic spine, most notably T7-8. IMPRESSION: 1. Stable size of the right upper lobe nodule with adjacent fiducials. 2. Confluent density posteriorly at the right lung apex is slightly larger than on the prior exam, but there is also reduced thickness of density along the volume loss posteriorly in the right upper lobe compatible with improving inflammation. These are likely inflammatory but may merit surveillance. Aortic Atherosclerosis (ICD10-I70.0) and Emphysema (ICD10-J43.9). Electronically Signed   By: Gaylyn Rong M.D.   On: 05/28/2023 14:07    Administration History     None          Latest Ref Rng & Units 02/16/2023    9:52 AM 07/19/2021   10:13 AM  PFT Results  FVC-Pre L 1.54  1.48   FVC-Predicted Pre % 50  47   FVC-Post L 1.52    FVC-Predicted Post % 49    Pre FEV1/FVC % % 38  36   Post FEV1/FCV % % 36    FEV1-Pre L 0.58  0.54   FEV1-Predicted Pre % 24  22   FEV1-Post L 0.55    DLCO uncorrected ml/min/mmHg 5.59  5.56   DLCO UNC% % 28  28   DLCO corrected ml/min/mmHg 5.70  5.56   DLCO COR %Predicted % 29  28   DLVA Predicted % 46  47   TLC L 6.30    TLC % Predicted % 124    RV % Predicted % 221      No results found for: "NITRICOXIDE"      Assessment & Plan:   COPD (chronic obstructive pulmonary disease) (HCC) Appears to be stable continue on current regimen  Plan  Patient Instructions  Continue on BREZTRI 2 puffs Twice daily  , rinse after use.  Albuterol inhaler/neb As needed   Flutter valve daily.  Continue on Prednisone 5mg  daily  Continue on Daliresp daily  Continue on Oxygen 2l/m at rest and 3 L with activity Activity as tolerated.  High protein diet .  I will call with CT results.  Flu shot today  Follow up with Dr. Tonia Brooms or Abby Tucholski NP in 4 months and As needed       Chronic respiratory failure with hypoxia (HCC) Continue on oxygen to maintain O2 saturations greater than 88 to 90%  Plan  Patient Instructions  Continue on BREZTRI 2 puffs Twice daily  , rinse after use.  Albuterol inhaler/neb As needed   Flutter valve daily.  Continue on Prednisone 5mg  daily  Continue on Daliresp daily  Continue on Oxygen 2l/m at rest and 3 L with activity Activity as tolerated.  High protein diet .  I will call with CT results.  Flu shot today  Follow up with Dr. Tonia Brooms or Owain Eckerman NP in 4 months and As needed      Pulmonary nodules Patient had a long history of scattered pulmonary nodules noted on CT scan dating back to 2018.  Repeat PET scans that showed some hypermetabolic activity.  She underwent navigational bronchoscopy in 2022 with negative cytology.  Most recent serial CT chest September 2024 shows right lung apex density is slightly larger but reduced in thickness compatible with an improving inflammatory process.  We will plan a CT chest follow-up in 6 months.  Protein-calorie malnutrition, severe Continue on high-protein diet     Rubye Oaks, NP 05/28/2023

## 2023-05-28 NOTE — Assessment & Plan Note (Signed)
Appears to be stable continue on current regimen  Plan  Patient Instructions  Continue on BREZTRI 2 puffs Twice daily  , rinse after use.  Albuterol inhaler/neb As needed   Flutter valve daily.  Continue on Prednisone 5mg  daily  Continue on Daliresp daily  Continue on Oxygen 2l/m at rest and 3 L with activity Activity as tolerated.  High protein diet .  I will call with CT results.  Flu shot today  Follow up with Dr. Tonia Brooms or Alvetta Hidrogo NP in 4 months and As needed

## 2023-05-28 NOTE — Assessment & Plan Note (Signed)
Patient had a long history of scattered pulmonary nodules noted on CT scan dating back to 2018.  Repeat PET scans that showed some hypermetabolic activity.  She underwent navigational bronchoscopy in 2022 with negative cytology.  Most recent serial CT chest September 2024 shows right lung apex density is slightly larger but reduced in thickness compatible with an improving inflammatory process.  We will plan a CT chest follow-up in 6 months.

## 2023-05-28 NOTE — Assessment & Plan Note (Signed)
Continue on high protein diet

## 2023-06-02 ENCOUNTER — Other Ambulatory Visit: Payer: Self-pay | Admitting: Hematology & Oncology

## 2023-06-02 ENCOUNTER — Encounter (HOSPITAL_COMMUNITY)
Admission: RE | Admit: 2023-06-02 | Discharge: 2023-06-02 | Disposition: A | Discharge status: Home / Self Care | Payer: Medicare Other | Source: Ambulatory Visit | Attending: Pulmonary Disease | Admitting: Pulmonary Disease

## 2023-06-02 VITALS — Wt 106.3 lb

## 2023-06-02 DIAGNOSIS — Z87891 Personal history of nicotine dependence: Secondary | ICD-10-CM | POA: Diagnosis not present

## 2023-06-02 DIAGNOSIS — Z79899 Other long term (current) drug therapy: Secondary | ICD-10-CM | POA: Insufficient documentation

## 2023-06-02 DIAGNOSIS — J439 Emphysema, unspecified: Secondary | ICD-10-CM | POA: Insufficient documentation

## 2023-06-02 DIAGNOSIS — J9611 Chronic respiratory failure with hypoxia: Secondary | ICD-10-CM | POA: Diagnosis present

## 2023-06-02 DIAGNOSIS — D649 Anemia, unspecified: Secondary | ICD-10-CM

## 2023-06-02 NOTE — Progress Notes (Signed)
Daily Session Note  Patient Details  Name: Prezlee Eisenbraun MRN: [id number] Date of Birth: [birth year] Referring Provider:   Doristine Devoid Pulmonary Rehab Walk Test from 03/27/2023 in [facility]  Referring Provider Icard       Encounter Date: 06/02/2023  Check In:  Session Check In - 06/02/23 0831       Check-In   Supervising physician immediately available to respond to emergencies [facility] MD immediately available    Physician(s) Robin Searing, NP    Location [facility]    Staff Present Essie Hart, RN, BSN;Randi Idelle Crouch BS, ACSM-CEP, Exercise Physiologist;Kaylee Earlene Plater, MS, ACSM-CEP, Exercise Physiologist;Maurine Mowbray Katrinka Blazing, RT    Virtual Visit No    Medication changes reported     No    Fall or balance concerns reported    No    Tobacco Cessation No Change    Warm-up and Cool-down Performed as group-led instruction    Resistance Training Performed Yes    VAD Patient? No    PAD/SET Patient? No      Pain Assessment   Currently in Pain? No/denies    Multiple Pain Sites No             Capillary Blood Glucose: No results found for this or any previous visit (from the past 24 hour(s)).   Exercise Prescription Changes - 06/02/23 0900       Response to Exercise   Blood Pressure (Admit) 122/58    Blood Pressure (Exercise) 140/62    Blood Pressure (Exit) 118/54    Heart Rate (Admit) 97 bpm    Heart Rate (Exercise) 110 bpm    Heart Rate (Exit) 96 bpm    Oxygen Saturation (Admit) 92 %    Oxygen Saturation (Exercise) 91 %    Oxygen Saturation (Exit) 98 %    Rating of Perceived Exertion (Exercise) 13    Perceived Dyspnea (Exercise) 2    Duration Continue with 30 min of aerobic exercise without signs/symptoms of physical distress.    Intensity THRR unchanged      Progression   Progression Continue to progress workloads to maintain intensity without signs/symptoms of physical distress.      Resistance  Training   Training Prescription Yes    Weight blue bands    Reps 10-15    Time 10 Minutes      Oxygen   Oxygen Continuous    Liters 2      NuStep   Level 4    Minutes 15    METs 2.2      Track   Laps 7    Minutes 15    METs 2.08      Oxygen   Maintain Oxygen Saturation 88% or higher             Social History   Tobacco Use  Smoking Status Former   Current packs/day: 0.00   Average packs/day: 2.0 packs/day for 40.0 years (80.0 ttl pk-yrs)   Types: Cigarettes   Start date: 03/16/1977   Quit date: 03/16/2017   Years since quitting: 6.2  Smokeless Tobacco Never  Tobacco Comments        Goals Met:  Proper associated with RPD/PD & O2 Sat Independence with exercise equipment Exercise tolerated well No report of concerns or symptoms today Strength training completed today  Goals Unmet:  Not Applicable  Comments: Service time is from 0810 to 0917.    Dr. Mechele Collin is  Medical Director for Pulmonary Rehab at [facility].

## 2023-06-03 ENCOUNTER — Encounter: Payer: Self-pay | Admitting: Medical Oncology

## 2023-06-03 ENCOUNTER — Inpatient Hospital Stay: Payer: Medicare Other | Attending: Hematology & Oncology

## 2023-06-03 ENCOUNTER — Other Ambulatory Visit: Payer: Self-pay

## 2023-06-03 ENCOUNTER — Other Ambulatory Visit: Payer: Self-pay | Admitting: *Deleted

## 2023-06-03 ENCOUNTER — Inpatient Hospital Stay (HOSPITAL_BASED_OUTPATIENT_CLINIC_OR_DEPARTMENT_OTHER): Payer: Medicare Other | Admitting: Medical Oncology

## 2023-06-03 VITALS — BP 107/49 | HR 82 | Temp 98.3°F | Resp 19 | Ht 64.0 in | Wt 103.1 lb

## 2023-06-03 DIAGNOSIS — Z79899 Other long term (current) drug therapy: Secondary | ICD-10-CM | POA: Insufficient documentation

## 2023-06-03 DIAGNOSIS — D509 Iron deficiency anemia, unspecified: Secondary | ICD-10-CM

## 2023-06-03 DIAGNOSIS — D473 Essential (hemorrhagic) thrombocythemia: Secondary | ICD-10-CM

## 2023-06-03 DIAGNOSIS — D649 Anemia, unspecified: Secondary | ICD-10-CM

## 2023-06-03 DIAGNOSIS — Z7982 Long term (current) use of aspirin: Secondary | ICD-10-CM | POA: Diagnosis not present

## 2023-06-03 LAB — CBC WITH DIFFERENTIAL (CANCER CENTER ONLY)
Abs Immature Granulocytes: 0.03 10*3/uL (ref 0.00–0.07)
Basophils Absolute: 0 10*3/uL (ref 0.0–0.1)
Basophils Relative: 1 %
Eosinophils Absolute: 0 10*3/uL (ref 0.0–0.5)
Eosinophils Relative: 1 %
HCT: 42.1 % (ref 36.0–46.0)
Hemoglobin: 13.6 g/dL (ref 12.0–15.0)
Immature Granulocytes: 1 %
Lymphocytes Relative: 28 %
Lymphs Abs: 1.5 10*3/uL (ref 0.7–4.0)
MCH: 31.3 pg (ref 26.0–34.0)
MCHC: 32.3 g/dL (ref 30.0–36.0)
MCV: 97 fL (ref 80.0–100.0)
Monocytes Absolute: 0.4 10*3/uL (ref 0.1–1.0)
Monocytes Relative: 8 %
Neutro Abs: 3.4 10*3/uL (ref 1.7–7.7)
Neutrophils Relative %: 61 %
Platelet Count: 304 10*3/uL (ref 150–400)
RBC: 4.34 MIL/uL (ref 3.87–5.11)
RDW: 14.7 % (ref 11.5–15.5)
WBC Count: 5.5 10*3/uL (ref 4.0–10.5)
nRBC: 0 % (ref 0.0–0.2)

## 2023-06-03 LAB — IRON AND IRON BINDING CAPACITY (CC-WL,HP ONLY)
Iron: 92 ug/dL (ref 28–170)
Saturation Ratios: 23 % (ref 10.4–31.8)
TIBC: 407 ug/dL (ref 250–450)
UIBC: 315 ug/dL (ref 148–442)

## 2023-06-03 LAB — CMP (CANCER CENTER ONLY)
ALT: 11 U/L (ref 0–44)
AST: 19 U/L (ref 15–41)
Albumin: 4.2 g/dL (ref 3.5–5.0)
Alkaline Phosphatase: 59 U/L (ref 38–126)
Anion gap: 9 (ref 5–15)
BUN: 10 mg/dL (ref 8–23)
CO2: 35 mmol/L — ABNORMAL HIGH (ref 22–32)
Calcium: 10.1 mg/dL (ref 8.9–10.3)
Chloride: 97 mmol/L — ABNORMAL LOW (ref 98–111)
Creatinine: 0.9 mg/dL (ref 0.44–1.00)
GFR, Estimated: >60 mL/min (ref >60)
Glucose, Bld: 99 mg/dL (ref 70–99)
Potassium: 4.8 mmol/L (ref 3.5–5.1)
Sodium: 141 mmol/L (ref 135–145)
Total Bilirubin: 0.5 mg/dL (ref 0.3–1.2)
Total Protein: 7.3 g/dL (ref 6.5–8.1)

## 2023-06-03 LAB — FERRITIN: Ferritin: 21 ng/mL (ref 11–307)

## 2023-06-03 LAB — LACTATE DEHYDROGENASE: LDH: 164 U/L (ref 98–192)

## 2023-06-03 MED ORDER — HYDROXYUREA 500 MG PO CAPS
ORAL_CAPSULE | ORAL | 1 refills | Status: DC
Start: 2023-06-03 — End: 2023-10-12

## 2023-06-03 NOTE — Progress Notes (Signed)
Hematology and Oncology Follow Up Visit  Cheryl Haley [id number] 07-14-1956 67 y.o. 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   At her last visit she was planning on seeing GI for some bleeding she was having. She saw them back in July.   No other blood loss noted. She bruises easily on aspirin but not in excess.  She denies fatigue or worsening of SOB. She uses 3L supplemental O2 when up and about and then drops to 2L when sedentary at home.  No fever, chills, n/v, cough, rash, dizziness, chest pain, palpitations, abdominal pain or changes in bowel or bladder habits.  No swelling, tenderness, numbness or tingling in her extremities at this time.  No falls or syncope reported. She uses her Rolator for added support.  Appetite and hydration have been good.  Wt Readings from Last 3 Encounters:  06/03/23 103 lb 1.9 oz (46.8 kg)  06/02/23 106 lb 4.2 oz (48.2 kg)  05/28/23 105 lb (47.6 kg)    ECOG Performance Status: 1 - Symptomatic but completely ambulatory  Medications:  Allergies as of 06/03/2023       Reactions   Bisoprolol Fumarate Other (See Comments)   Other reaction(s): heart issues        Medication List        Accurate as of June 03, 2023 10:19 AM. If you have any questions, ask your nurse or doctor.          STOP taking these medications    gabapentin 300 MG capsule Commonly known as: NEURONTIN Stopped by: Brand Males Elvina Bosch   Na Sulfate-K Sulfate-Mg Sulf 17.5-3.13-1.6 GM/177ML Soln Commonly known as: Suprep Bowel Prep Kit Stopped by: Rushie Chestnut       TAKE these medications    albuterol 108 (90 Base) MCG/ACT inhaler Commonly known as: VENTOLIN HFA Inhale 2 puffs into the lungs every 6 (six) hours as needed for wheezing or  shortness of breath.   albuterol (2.5 MG/3ML) 0.083% nebulizer solution Commonly known as: PROVENTIL Take 3 mLs (2.5 mg total) by nebulization every 6 (six) hours as needed for wheezing or shortness of breath.   alendronate 70 MG tablet Commonly known as: FOSAMAX Take 70 mg by mouth every Saturday.   amitriptyline 50 MG tablet Commonly known as: ELAVIL Take 50 mg by mouth at bedtime.   aspirin EC 81 MG tablet Take 81 mg daily by mouth.   Breztri Aerosphere 160-9-4.8 MCG/ACT Aero Generic drug: Budeson-Glycopyrrol-Formoterol INHALE 2 PUFFS TWICE DAILY   Daliresp 500 MCG Tabs tablet Generic drug: roflumilast Take 1 tablet by mouth once daily   roflumilast 500 MCG Tabs tablet Commonly known as: DALIRESP Take 1 tablet (500 mcg total) by mouth daily.   hydroxyurea 500 MG capsule Commonly known as: HYDREA TAKE ONE CAPSULE BY MOUTH EVERY OTHER DAY WITH FOOD TO MINIMIZE GI SIDE EFFECTS   levothyroxine 75 MCG tablet Commonly known as: SYNTHROID Take 75 mcg by mouth every morning.   metoprolol succinate 25 MG 24 hr tablet Commonly known as: TOPROL-XL Take 1 tablet (25 mg total) by mouth daily.   multivitamin with minerals Tabs tablet Take 1 tablet by mouth daily.   OXYGEN Inhale 2 L into the lungs continuous.   predniSONE 5 MG tablet Commonly known as: DELTASONE Take 1 tablet  by mouth once daily with breakfast   Repatha SureClick 140 MG/ML Soaj Generic drug: Evolocumab Inject 140 mg into the skin every 14 (fourteen) days.   rosuvastatin 40 MG tablet Commonly known as: CRESTOR Take 1 tablet by mouth once daily        Allergies:  Allergies  Allergen Reactions   Bisoprolol Fumarate Other (See Comments)    Other reaction(s): heart issues    Past Medical History, Surgical history, Social history, and Family History were reviewed and updated.  Review of Systems: All other 10 point review of systems is negative.   Physical Exam:  height is 5\' 4"  (1.626 m) and  weight is 103 lb 1.9 oz (46.8 kg). Her oral temperature is 98.3 F (36.8 C). Her blood pressure is 107/49 (abnormal) and her pulse is 82. Her respiration is 19 and oxygen saturation is 97%.   Wt Readings from Last 3 Encounters:  06/03/23 103 lb 1.9 oz (46.8 kg)  06/02/23 106 lb 4.2 oz (48.2 kg)  05/28/23 105 lb (47.6 kg)    Ocular: Sclerae unicteric, pupils equal, round and reactive to light Ear-nose-throat: Oropharynx clear, dentition fair Lymphatic: No cervical or supraclavicular adenopathy Lungs no rales or rhonchi, good excursion bilaterally Heart regular rate and rhythm, no murmur appreciated Abd soft, nontender, positive bowel sounds MSK no focal spinal tenderness, no joint edema Neuro: non-focal, well-oriented, appropriate affect   Lab Results  Component Value Date   WBC 5.5 06/03/2023   HGB 13.6 06/03/2023   HCT 42.1 06/03/2023   MCV 97.0 06/03/2023   PLT 304 06/03/2023   Lab Results  Component Value Date   FERRITIN 35 01/23/2023   IRON 59 01/23/2023   TIBC 339 01/23/2023   UIBC 280 01/23/2023   IRONPCTSAT 17 01/23/2023   Lab Results  Component Value Date   RBC 4.34 06/03/2023   No results found for: "KPAFRELGTCHN", "LAMBDASER", "KAPLAMBRATIO" No results found for: "IGGSERUM", "IGA", "IGMSERUM" Lab Results  Component Value Date   TOTALPROTELP 6.8 09/23/2018   ALBUMINELP 3.9 09/23/2018   A1GS 0.1 09/23/2018   A2GS 0.9 09/23/2018   BETS 0.8 09/23/2018   GAMS 1.0 09/23/2018   MSPIKE Not Observed 09/23/2018   SPEI Comment 09/23/2018     Chemistry      Component Value Date/Time   NA 134 (L) 01/26/2023 0849   K 3.5 01/26/2023 0849   CL 94 (L) 01/26/2023 0849   CO2 27 01/26/2023 0849   BUN 18 01/26/2023 0849   CREATININE 0.94 01/26/2023 0849   CREATININE 0.78 01/23/2023 0928      Component Value Date/Time   CALCIUM 9.1 01/26/2023 0849   ALKPHOS 58 01/26/2023 0849   AST 27 01/26/2023 0849   AST 18 01/23/2023 0928   ALT 16 01/26/2023 0849   ALT 15  01/23/2023 0928   BILITOT 0.8 01/26/2023 0849   BILITOT 0.6 01/23/2023 0928      Impression and Plan: Cheryl Haley is a very pleasant 67 yo female with essential thrombocythemia, JAK2 V617F+; high risk by IPSET. She currently is taking Hydrea 500 mg every other day.   She is doing well and her counts remain stable Hydrea refilled Iron studies are pending RTC 4 months APP, labs   Rushie Chestnut, PA-C 10/2/202410:19 AM

## 2023-06-03 NOTE — Progress Notes (Signed)
Pulmonary Individual Treatment Plan  Patient Details  Name: Cheryl Haley MRN: [id number] Date of Birth: [birth year] Referring Provider:   Doristine Devoid Pulmonary Rehab Walk Test from 03/27/2023 in [facility]  Referring Provider Icard       Initial Encounter Date:  Flowsheet Row Pulmonary Rehab Walk Test from 03/27/2023 in [facility]  Date 03/27/23       Visit Diagnosis: Chronic hypoxemic respiratory failure (HCC)  Patient's Home Medications on Admission:   Current Outpatient Medications:    albuterol (PROVENTIL) (2.5 MG/3ML) 0.083% nebulizer solution, Take 3 mLs (2.5 mg total) by nebulization every 6 (six) hours as needed for wheezing or shortness of breath., Disp: 75 mL, Rfl: 5   albuterol (VENTOLIN HFA) 108 (90 Base) MCG/ACT inhaler, Inhale 2 puffs into the lungs every 6 (six) hours as needed for wheezing or shortness of breath., Disp: 18 g, Rfl: 6   alendronate (FOSAMAX) 70 MG tablet, Take 70 mg by mouth every Saturday., Disp: , Rfl:    amitriptyline (ELAVIL) 50 MG tablet, Take 50 mg by mouth at bedtime., Disp: , Rfl:    aspirin EC 81 MG tablet, Take 81 mg daily by mouth. , Disp: , Rfl:    Budeson-Glycopyrrol-Formoterol (BREZTRI AEROSPHERE) 160-9-4.8 MCG/ACT AERO, INHALE 2 PUFFS TWICE DAILY, Disp: 11 g, Rfl: 5   DALIRESP 500 MCG TABS tablet, Take 1 tablet by mouth once daily, Disp: 30 tablet, Rfl: 2   Evolocumab (REPATHA SURECLICK) 140 MG/ML SOAJ, Inject 140 mg into the skin every 14 (fourteen) days., Disp: 2 mL, Rfl: 11   gabapentin (NEURONTIN) 300 MG capsule, Take 300 mg by mouth 2 (two) times daily., Disp: , Rfl:    hydroxyurea (HYDREA) 500 MG capsule, TAKE ONE CAPSULE BY MOUTH EVERY OTHER DAY WITH FOOD TO MINIMIZE GI SIDE EFFECTS, Disp: 45 capsule, Rfl: 0   levothyroxine (SYNTHROID) 75 MCG tablet, Take 75 mcg by mouth every morning., Disp: , Rfl:     metoprolol succinate (TOPROL-XL) 25 MG 24 hr tablet, Take 1 tablet (25 mg total) by mouth daily., Disp: 90 tablet, Rfl: 3   Multiple Vitamin (MULTIVITAMIN WITH MINERALS) TABS tablet, Take 1 tablet by mouth daily., Disp: , Rfl:    Na Sulfate-K Sulfate-Mg Sulf (SUPREP BOWEL PREP KIT) 17.5-3.13-1.6 GM/177ML SOLN, Take 1 kit by mouth as directed. 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     Pneumonia    Protein-calorie malnutrition, severe 09/17/2018   Pulmonary nodules 07/14/2018   07/2017 CT chest >pulmonary nodules noted.  07/2017 PET scan -Hypermetabolic LUL nodule  12/2017 CT chest >decreased nodule size, new pulmonary nodules noted.  FOB -atypical cells  CT chest 07/2018 >stable nodules to smaller on established nodules . 2 new nodules RUL , RLL >CT chest 3 months    Thyroid disease    Tobacco abuse 11/28/2015   Smoked 2 packs of cigarettes daily from teenage years until age 87, quit in 2016    Unspecified hypothyroidism 09/20/2013   Viral respiratory  infection 09/16/2018    Tobacco Use: Social History   Tobacco Use  Smoking Status Former   Current packs/day: 0.00   Average packs/day: 2.0 packs/day for 40.0 years (80.0 ttl pk-yrs)   Types: Cigarettes   Start date: 03/16/1977   Quit date: 03/16/2017   Years since quitting: 6.2  Smokeless Tobacco Never  Tobacco Comments        Labs: Review Flowsheet  More data exists      Latest Ref Rng & Units 03/28/2021 07/01/2021 07/16/2021 09/18/2021 10/27/2022  Labs for ITP Cardiac and Pulmonary Rehab  Cholestrol 100 - 199 mg/dL - - 161  096  045   LDL (calc) 0 - 99 mg/dL - - 79  87  30   HDL-C >39 mg/dL - - 57  59  63   Trlycerides 0 - 149 mg/dL - - 58  58  45   PH, Arterial 7.350 - 7.450 - 7.429  - - -  PCO2 arterial 32.0 - 48.0 mmHg - 41.3  - - -  Bicarbonate 20.0 - 28.0 mmol/L 27.9  26.8  - - -  O2 Saturation % 69.8  98.1  - - -    Details            Capillary Blood Glucose: Lab Results  Component Value Date   GLUCAP 103 (H) 04/06/2023   GLUCAP 101 (H) 06/02/2022   GLUCAP 102 (H) 07/09/2021   GLUCAP 107 (H) 07/14/2017   GLUCAP 202 (H) 09/22/2013     Pulmonary Assessment Scores:  Pulmonary Assessment Scores     Row Name 03/27/23 1057         ADL UCSD   ADL Phase Entry     SOB Score total 74       CAT Score   CAT Score 14       mMRC Score   mMRC Score 4             UCSD: Self-administered rating of dyspnea associated with activities of daily living (ADLs) 6-point scale (0 = "not at all" to 5 = "maximal or unable to do because of breathlessness")  Scoring Scores range from 0 to 120.  Minimally important difference is 5 units  CAT: CAT can identify the health impairment of COPD patients and is better correlated with disease progression.  CAT has a scoring range of zero to 40. The CAT score is classified into four groups of low (less than 10), medium (10 - 20), high (21-30) and very high (31-40) based on the impact level of disease on health status. A  CAT score over 10 suggests significant symptoms.  A worsening CAT score could be explained by an exacerbation, poor medication adherence, poor inhaler technique, or progression of COPD or comorbid conditions.  CAT MCID is 2  points  mMRC: mMRC (Modified Medical Research Council) Dyspnea Scale is used to assess the degree of baseline functional disability in patients of respiratory disease due to dyspnea. No minimal important difference is established. A decrease in score of 1 point or greater is considered a positive change.   Pulmonary Function Assessment:  Pulmonary Function Assessment - 03/27/23 1055       Breath   Bilateral Breath Sounds Clear;Decreased    Shortness of Breath Yes;Fear of Shortness of Breath;Limiting activity             Exercise Target Goals: Exercise Program Goal: Individual exercise prescription set using results from initial 6 min walk test and THRR while considering  patient's activity barriers and safety.   Exercise Prescription Goal: Initial exercise prescription builds to 30-45 minutes a day of aerobic activity, 2-3 days per week.  Home exercise guidelines will be given to patient during program as part of exercise prescription that the participant will acknowledge.  Activity Barriers & Risk Stratification:  Activity Barriers & Cardiac Risk Stratification - 03/27/23 1050       Activity Barriers & Cardiac Risk Stratification   Activity Barriers Deconditioning;Muscular Weakness;Shortness of Breath    Cardiac Risk Stratification Moderate             6 Minute Walk:  6 Minute Walk     Row Name 03/27/23 1130         6 Minute Walk   Phase Initial     Distance 555 feet     Walk Time 6 minutes     # of Rest Breaks 0     MPH 1.05     METS 2.21     RPE 13     Perceived Dyspnea  2     VO2 Peak 7.74     Symptoms No     Resting HR 94 bpm     Resting BP 132/60     Resting Oxygen Saturation  96 %     Exercise Oxygen Saturation  during 6 min  walk 93 %     Max Ex. HR 105 bpm     Max Ex. BP 132/60     2 Minute Post BP 124/60       Interval HR   1 Minute HR 102     2 Minute HR 103     3 Minute HR 102     4 Minute HR 103     5 Minute HR 104     6 Minute HR 105     2 Minute Post HR 97     Interval Heart Rate? Yes       Interval Oxygen   Interval Oxygen? Yes     Baseline Oxygen Saturation % 96 %     1 Minute Oxygen Saturation % 98 %     1 Minute Liters of Oxygen 2 L     2 Minute Oxygen Saturation % 98 %     2 Minute Liters of Oxygen 2 L     3 Minute Oxygen Saturation % 97 %     3 Minute Liters of Oxygen 2 L     4 Minute Oxygen Saturation % 95 %     4 Minute Liters of Oxygen 2 L     5 Minute Oxygen Saturation % 95 %     5 Minute Liters of Oxygen 2 L     6 Minute Oxygen Saturation % 95 %  6 Minute Liters of Oxygen 2 L     2 Minute Post Oxygen Saturation % 93 %     2 Minute Post Liters of Oxygen 2 L              Oxygen Initial Assessment:  Oxygen Initial Assessment - 03/27/23 1051       Home Oxygen   Home Oxygen Device Portable Concentrator;Home Concentrator;E-Tanks   E tanks for backup   Sleep Oxygen Prescription Continuous    Liters per minute 2    Home Exercise Oxygen Prescription Continuous    Liters per minute 2    Home Resting Oxygen Prescription Continuous    Liters per minute 2    Compliance with Home Oxygen Use Yes      Initial 6 min Walk   Oxygen Used Continuous    Liters per minute 2      Program Oxygen Prescription   Program Oxygen Prescription Continuous    Liters per minute 2      Intervention   Short Term Goals To learn and exhibit compliance with exercise, home and travel O2 prescription;To learn and understand importance of maintaining oxygen saturations>88%;To learn and demonstrate proper use of respiratory medications;To learn and understand importance of monitoring SPO2 with pulse oximeter and demonstrate accurate use of the pulse oximeter.;To learn and demonstrate proper pursed  lip breathing techniques or other breathing techniques.     Long  Term Goals Exhibits compliance with exercise, home  and travel O2 prescription;Maintenance of O2 saturations>88%;Compliance with respiratory medication;Verbalizes importance of monitoring SPO2 with pulse oximeter and return demonstration;Exhibits proper breathing techniques, such as pursed lip breathing or other method taught during program session;Demonstrates proper use of MDI's             Oxygen Re-Evaluation:  Oxygen Re-Evaluation     Row Name 04/02/23 0918 04/28/23 1602 05/26/23 0757         Program Oxygen Prescription   Program Oxygen Prescription Continuous Continuous Continuous     Liters per minute 2 2 2        Home Oxygen   Home Oxygen Device Portable Concentrator;Home Concentrator;E-Tanks  E tanks for backup Portable Concentrator;Home Concentrator;E-Tanks  E tanks for backup Portable Concentrator;Home Concentrator;E-Tanks  E tanks for backup     Sleep Oxygen Prescription Continuous Continuous Continuous     Liters per minute 2 2 2      Home Exercise Oxygen Prescription Continuous Continuous Continuous     Liters per minute 2 2 2      Home Resting Oxygen Prescription Continuous Continuous Continuous     Liters per minute 2 2 2      Compliance with Home Oxygen Use Yes Yes Yes       Goals/Expected Outcomes   Short Term Goals To learn and exhibit compliance with exercise, home and travel O2 prescription;To learn and understand importance of maintaining oxygen saturations>88%;To learn and demonstrate proper use of respiratory medications;To learn and understand importance of monitoring SPO2 with pulse oximeter and demonstrate accurate use of the pulse oximeter.;To learn and demonstrate proper pursed lip breathing techniques or other breathing techniques.  To learn and exhibit compliance with exercise, home and travel O2 prescription;To learn and understand importance of maintaining oxygen saturations>88%;To learn and  demonstrate proper use of respiratory medications;To learn and understand importance of monitoring SPO2 with pulse oximeter and demonstrate accurate use of the pulse oximeter.;To learn and demonstrate proper pursed lip breathing techniques or other breathing techniques.  To learn and exhibit compliance with  exercise, home and travel O2 prescription;To learn and understand importance of maintaining oxygen saturations>88%;To learn and demonstrate proper use of respiratory medications;To learn and understand importance of monitoring SPO2 with pulse oximeter and demonstrate accurate use of the pulse oximeter.;To learn and demonstrate proper pursed lip breathing techniques or other breathing techniques.      Long  Term Goals Exhibits compliance with exercise, home  and travel O2 prescription;Maintenance of O2 saturations>88%;Compliance with respiratory medication;Verbalizes importance of monitoring SPO2 with pulse oximeter and return demonstration;Exhibits proper breathing techniques, such as pursed lip breathing or other method taught during program session;Demonstrates proper use of MDI's Exhibits compliance with exercise, home  and travel O2 prescription;Maintenance of O2 saturations>88%;Compliance with respiratory medication;Verbalizes importance of monitoring SPO2 with pulse oximeter and return demonstration;Exhibits proper breathing techniques, such as pursed lip breathing or other method taught during program session;Demonstrates proper use of MDI's Exhibits compliance with exercise, home  and travel O2 prescription;Maintenance of O2 saturations>88%;Compliance with respiratory medication;Verbalizes importance of monitoring SPO2 with pulse oximeter and return demonstration;Exhibits proper breathing techniques, such as pursed lip breathing or other method taught during program session;Demonstrates proper use of MDI's     Goals/Expected Outcomes Compliance and understanding of oxygen saturation monitoring and  breathing techniques to decrease shortness of breath Compliance and understanding of oxygen saturation monitoring and breathing techniques to decrease shortness of breath Compliance and understanding of oxygen saturation monitoring and breathing techniques to decrease shortness of breath              Oxygen Discharge (Final Oxygen Re-Evaluation):  Oxygen Re-Evaluation - 05/26/23 0757       Program Oxygen Prescription   Program Oxygen Prescription Continuous    Liters per minute 2      Home Oxygen   Home Oxygen Device Portable Concentrator;Home Concentrator;E-Tanks   E tanks for backup   Sleep Oxygen Prescription Continuous    Liters per minute 2    Home Exercise Oxygen Prescription Continuous    Liters per minute 2    Home Resting Oxygen Prescription Continuous    Liters per minute 2    Compliance with Home Oxygen Use Yes      Goals/Expected Outcomes   Short Term Goals To learn and exhibit compliance with exercise, home and travel O2 prescription;To learn and understand importance of maintaining oxygen saturations>88%;To learn and demonstrate proper use of respiratory medications;To learn and understand importance of monitoring SPO2 with pulse oximeter and demonstrate accurate use of the pulse oximeter.;To learn and demonstrate proper pursed lip breathing techniques or other breathing techniques.     Long  Term Goals Exhibits compliance with exercise, home  and travel O2 prescription;Maintenance of O2 saturations>88%;Compliance with respiratory medication;Verbalizes importance of monitoring SPO2 with pulse oximeter and return demonstration;Exhibits proper breathing techniques, such as pursed lip breathing or other method taught during program session;Demonstrates proper use of MDI's    Goals/Expected Outcomes Compliance and understanding of oxygen saturation monitoring and breathing techniques to decrease shortness of breath             Initial Exercise Prescription:  Initial  Exercise Prescription - 03/27/23 1100       Date of Initial Exercise RX and Referring Provider   Date 03/27/23    Referring Provider Icard    Expected Discharge Date 06/25/23      Oxygen   Oxygen Continuous    Liters 2    Maintain Oxygen Saturation 88% or higher      NuStep   Level 1  SPM 50    Minutes 30    METs 1.5      Prescription Details   Frequency (times per week) 2    Duration Progress to 30 minutes of continuous aerobic without signs/symptoms of physical distress      Intensity   THRR 40-80% of Max Heartrate 61-122    Ratings of Perceived Exertion 11-13    Perceived Dyspnea 0-4      Progression   Progression Continue to progress workloads to maintain intensity without signs/symptoms of physical distress.      Resistance Training   Training Prescription Yes    Weight red bands    Reps 10-15             Perform Capillary Blood Glucose checks as needed.  Exercise Prescription Changes:   Exercise Prescription Changes     Row Name 04/07/23 1500 04/16/23 1503 05/05/23 0900 05/07/23 1500 05/19/23 0900     Response to Exercise   Blood Pressure (Admit) 122/56 98/60 116/62 -- 130/60   Blood Pressure (Exercise) 138/68 -- 148/62 -- 142/68   Blood Pressure (Exit) 128/70 112/68 130/70 -- 128/64   Heart Rate (Admit) 104 bpm 86 bpm 76 bpm -- 84 bpm   Heart Rate (Exercise) 108 bpm 108 bpm 81 bpm -- 100 bpm   Heart Rate (Exit) 106 bpm 93 bpm 80 bpm -- 87 bpm   Oxygen Saturation (Admit) 95 % 98 % 100 % -- 96 %   Oxygen Saturation (Exercise) 92 % 89 % 97 % -- 92 %   Oxygen Saturation (Exit) 98 % 94 % 97 % -- 99 %   Rating of Perceived Exertion (Exercise) 11 15 13  -- 13   Perceived Dyspnea (Exercise) 1 3 2  -- 3   Duration Continue with 30 min of aerobic exercise without signs/symptoms of physical distress. Continue with 30 min of aerobic exercise without signs/symptoms of physical distress. Continue with 30 min of aerobic exercise without signs/symptoms of physical  distress. -- Continue with 30 min of aerobic exercise without signs/symptoms of physical distress.   Intensity THRR unchanged THRR unchanged THRR unchanged -- THRR unchanged     Progression   Progression -- -- -- -- Continue to progress workloads to maintain intensity without signs/symptoms of physical distress.     Resistance Training   Training Prescription Yes Yes Yes -- Yes   Weight red bands red bands red bands -- red bands   Reps 10-15 10-15 10-15 -- 10-15   Time 10 Minutes 10 Minutes 10 Minutes -- 10 Minutes     Oxygen   Oxygen Continuous Continuous Continuous -- Continuous   Liters 2 2 2  -- 2     NuStep   Level 1 2 3  -- 3   Minutes 30 15 15  -- 15   METs 1.6 1.9 1.8 -- 2.3     Track   Laps -- 7 8 -- 8   Minutes -- 15 15 -- 15   METs -- 2.08 2.23 -- 2.23     Home Exercise Plan   Plans to continue exercise at -- -- -- Home (comment) --   Frequency -- -- -- --  n/a --   Initial Home Exercises Provided -- -- -- 05/07/23 --     Oxygen   Maintain Oxygen Saturation 88% or higher 88% or higher 88% or higher -- 88% or higher    Row Name 06/02/23 0900             Response  to Exercise   Blood Pressure (Admit) 122/58       Blood Pressure (Exercise) 140/62       Blood Pressure (Exit) 118/54       Heart Rate (Admit) 97 bpm       Heart Rate (Exercise) 110 bpm       Heart Rate (Exit) 96 bpm       Oxygen Saturation (Admit) 92 %       Oxygen Saturation (Exercise) 91 %       Oxygen Saturation (Exit) 98 %       Rating of Perceived Exertion (Exercise) 13       Perceived Dyspnea (Exercise) 2       Duration Continue with 30 min of aerobic exercise without signs/symptoms of physical distress.       Intensity THRR unchanged         Progression   Progression Continue to progress workloads to maintain intensity without signs/symptoms of physical distress.         Resistance Training   Training Prescription Yes       Weight blue bands       Reps 10-15       Time 10 Minutes          Oxygen   Oxygen Continuous       Liters 2         NuStep   Level 4       Minutes 15       METs 2.2         Track   Laps 7       Minutes 15       METs 2.08         Oxygen   Maintain Oxygen Saturation 88% or higher                Exercise Comments:   Exercise Comments     Row Name 04/02/23 1601 05/07/23 1547         Exercise Comments Pt completed first day of group exercise. Exercised on the recumbent stepper for 30 min at level 1. Tolerated well. Performed warm up and cool down with intermittent seated breaks. Performed sit to stands in place of squats. Completed home ExRx. Cheryl Haley is currently exercising at home. She is walking/ doing cool down exercises 5 days/wk for 30 min/day. I encouraged Cheryl Haley to focus more on the walking than cooldown exercises. Explained the difference between aerobic and resistance exercise. Cheryl Haley showed understanding of my explanation. Cheryl Haley is motivated to exercise as she told me she was walking before she started PR. I encouraged Cheryl Haley to continue walking at home. I am confident Vitalia completed an exercise regimen at home.               Exercise Goals and Review:   Exercise Goals     Row Name 03/27/23 1050 04/02/23 0917 04/28/23 1551         Exercise Goals   Increase Physical Activity Yes Yes Yes     Intervention Provide advice, education, support and counseling about physical activity/exercise needs.;Develop an individualized exercise prescription for aerobic and resistive training based on initial evaluation findings, risk stratification, comorbidities and participant's personal goals. Provide advice, education, support and counseling about physical activity/exercise needs.;Develop an individualized exercise prescription for aerobic and resistive training based on initial evaluation findings, risk stratification, comorbidities and participant's personal goals. Provide advice, education, support and counseling about physical  activity/exercise needs.;Develop an individualized exercise prescription  for aerobic and resistive training based on initial evaluation findings, risk stratification, comorbidities and participant's personal goals.     Expected Outcomes Short Term: Attend rehab on a regular basis to increase amount of physical activity.;Long Term: Exercising regularly at least 3-5 days a week.;Long Term: Add in home exercise to make exercise part of routine and to increase amount of physical activity. Short Term: Attend rehab on a regular basis to increase amount of physical activity.;Long Term: Exercising regularly at least 3-5 days a week.;Long Term: Add in home exercise to make exercise part of routine and to increase amount of physical activity. Short Term: Attend rehab on a regular basis to increase amount of physical activity.;Long Term: Exercising regularly at least 3-5 days a week.;Long Term: Add in home exercise to make exercise part of routine and to increase amount of physical activity.     Increase Strength and Stamina Yes Yes Yes     Intervention Provide advice, education, support and counseling about physical activity/exercise needs.;Develop an individualized exercise prescription for aerobic and resistive training based on initial evaluation findings, risk stratification, comorbidities and participant's personal goals. Provide advice, education, support and counseling about physical activity/exercise needs.;Develop an individualized exercise prescription for aerobic and resistive training based on initial evaluation findings, risk stratification, comorbidities and participant's personal goals. Provide advice, education, support and counseling about physical activity/exercise needs.;Develop an individualized exercise prescription for aerobic and resistive training based on initial evaluation findings, risk stratification, comorbidities and participant's personal goals.     Expected Outcomes Short Term: Increase  workloads from initial exercise prescription for resistance, speed, and METs.;Short Term: Perform resistance training exercises routinely during rehab and add in resistance training at home;Long Term: Improve cardiorespiratory fitness, muscular endurance and strength as measured by increased METs and functional capacity ( ) Short Term: Increase workloads from initial exercise prescription for resistance, speed, and METs.;Short Term: Perform resistance training exercises routinely during rehab and add in resistance training at home;Long Term: Improve cardiorespiratory fitness, muscular endurance and strength as measured by increased METs and functional capacity ( ) Short Term: Increase workloads from initial exercise prescription for resistance, speed, and METs.;Short Term: Perform resistance training exercises routinely during rehab and add in resistance training at home;Long Term: Improve cardiorespiratory fitness, muscular endurance and strength as measured by increased METs and functional capacity ( )     Able to understand and use rate of perceived exertion (RPE) scale Yes Yes Yes     Intervention Provide education and explanation on how to use RPE scale Provide education and explanation on how to use RPE scale Provide education and explanation on how to use RPE scale     Expected Outcomes Short Term: Able to use RPE daily in rehab to express subjective intensity level;Long Term:  Able to use RPE to guide intensity level when exercising independently Short Term: Able to use RPE daily in rehab to express subjective intensity level;Long Term:  Able to use RPE to guide intensity level when exercising independently Short Term: Able to use RPE daily in rehab to express subjective intensity level;Long Term:  Able to use RPE to guide intensity level when exercising independently     Able to understand and use Dyspnea scale Yes Yes Yes     Intervention Provide education and explanation on how to use  Dyspnea scale Provide education and explanation on how to use Dyspnea scale Provide education and explanation on how to use Dyspnea scale     Expected Outcomes Short Term: Able to use Dyspnea scale  daily in rehab to express subjective sense of shortness of breath during exertion;Long Term: Able to use Dyspnea scale to guide intensity level when exercising independently Short Term: Able to use Dyspnea scale daily in rehab to express subjective sense of shortness of breath during exertion;Long Term: Able to use Dyspnea scale to guide intensity level when exercising independently Short Term: Able to use Dyspnea scale daily in rehab to express subjective sense of shortness of breath during exertion;Long Term: Able to use Dyspnea scale to guide intensity level when exercising independently     Knowledge and understanding of Target Heart Rate Range (THRR) Yes Yes Yes     Intervention Provide education and explanation of THRR including how the numbers were predicted and where they are located for reference Provide education and explanation of THRR including how the numbers were predicted and where they are located for reference Provide education and explanation of THRR including how the numbers were predicted and where they are located for reference     Expected Outcomes Short Term: Able to state/look up THRR;Long Term: Able to use THRR to govern intensity when exercising independently;Short Term: Able to use daily as guideline for intensity in rehab Short Term: Able to state/look up THRR;Long Term: Able to use THRR to govern intensity when exercising independently;Short Term: Able to use daily as guideline for intensity in rehab Short Term: Able to state/look up THRR;Long Term: Able to use THRR to govern intensity when exercising independently;Short Term: Able to use daily as guideline for intensity in rehab     Understanding of Exercise Prescription Yes Yes Yes     Intervention Provide education, explanation, and  written materials on patient's individual exercise prescription Provide education, explanation, and written materials on patient's individual exercise prescription Provide education, explanation, and written materials on patient's individual exercise prescription     Expected Outcomes Short Term: Able to explain program exercise prescription;Long Term: Able to explain home exercise prescription to exercise independently Short Term: Able to explain program exercise prescription;Long Term: Able to explain home exercise prescription to exercise independently Short Term: Able to explain program exercise prescription;Long Term: Able to explain home exercise prescription to exercise independently              Exercise Goals Re-Evaluation :  Exercise Goals Re-Evaluation     Row Name 04/02/23 0917 04/28/23 1551 05/26/23 0750         Exercise Goal Re-Evaluation   Exercise Goals Review Increase Physical Activity;Able to understand and use Dyspnea scale;Understanding of Exercise Prescription;Increase Strength and Stamina;Knowledge and understanding of Target Heart Rate Range (THRR);Able to understand and use rate of perceived exertion (RPE) scale Increase Physical Activity;Able to understand and use Dyspnea scale;Understanding of Exercise Prescription;Increase Strength and Stamina;Knowledge and understanding of Target Heart Rate Range (THRR);Able to understand and use rate of perceived exertion (RPE) scale Increase Physical Activity;Able to understand and use Dyspnea scale;Understanding of Exercise Prescription;Increase Strength and Stamina;Knowledge and understanding of Target Heart Rate Range (THRR);Able to understand and use rate of perceived exertion (RPE) scale     Comments Pt will begin exercise today, 8/1. Will progress as tolerated. Cheryl Haley has completed 6 exercise sessions. She exercises for 15 min on the [place] and track. Yaret averages 1.8 METs at level 2 on the Nust[place] and 2.08 METs on the track.  Aziana have increased her level on the Nustep as METs have increased. Mishka has also progressed to walking the track for 15 min. She feels that her functional capacity is improving  since starting PR. Will continue to monitor and progreess as able. Oda has completed 13 exercise sessions. She exercises for 15 min on the [place] and track. Mitsuko averages 2.1 METs at level 3 on the Nustep and 2.23 METs on the track. She performs the warmup and cooldown standing without limitations. Avelin have increased her level on the Nustep as METs have increased. Maebree has increased her track laps. Will continue to monitor and progress as able.     Expected Outcomes Through exercise at rehab and home, the patient will decrease shortness of breath with daily activities and feel confident in carrying out an exercise regimen at home Through exercise at rehab and home, the patient will decrease shortness of breath with daily activities and feel confident in carrying out an exercise regimen at home Through exercise at rehab and home, the patient will decrease shortness of breath with daily activities and feel confident in carrying out an exercise regimen at home              Discharge Exercise Prescription (Final Exercise Prescription Changes):  Exercise Prescription Changes - 06/02/23 0900       Response to Exercise   Blood Pressure (Admit) 122/58    Blood Pressure (Exercise) 140/62    Blood Pressure (Exit) 118/54    Heart Rate (Admit) 97 bpm    Heart Rate (Exercise) 110 bpm    Heart Rate (Exit) 96 bpm    Oxygen Saturation (Admit) 92 %    Oxygen Saturation (Exercise) 91 %    Oxygen Saturation (Exit) 98 %    Rating of Perceived Exertion (Exercise) 13    Perceived Dyspnea (Exercise) 2    Duration Continue with 30 min of aerobic exercise without signs/symptoms of physical distress.    Intensity THRR unchanged      Progression   Progression Continue to progress workloads to maintain intensity without  signs/symptoms of physical distress.      Resistance Training   Training Prescription Yes    Weight blue bands    Reps 10-15    Time 10 Minutes      Oxygen   Oxygen Continuous    Liters 2      NuStep   Level 4    Minutes 15    METs 2.2      Track   Laps 7    Minutes 15    METs 2.08      Oxygen   Maintain Oxygen Saturation 88% or higher             Nutrition:  Target Goals: Understanding of nutrition guidelines, daily intake of sodium 1500mg , cholesterol 200mg , calories 30% from fat and 7% or less from saturated fats, daily to have 5 or more servings of fruits and vegetables.  Biometrics:  Pre Biometrics - 03/27/23 1134       Pre Biometrics   Grip Strength 21 kg              Nutrition Therapy Plan and Nutrition Goals:  Nutrition Therapy & Goals - 06/02/23 1307       Nutrition Therapy   Diet Heart Healthy Diet      Personal Nutrition Goals   Nutrition Goal Patient to identify strategies for weight maintenance/weight gain of 0.5-2.0# per week.   goal in progress.   Comments Goals in action. Cheryl Haley reports history of weight loss with protein calorie malnutirtion with weight as low as 85# in July of 2023. She is hopeful to gain back  to 115-120#. She prefers to eat "whatever" she wants but is receptive to increasing eating frequency to every 2-3 hours and implemented Boost/Ensure 1x/day. She is up 1.1# since starting with our program. Cheryl Haley will continue to benefit from participation in pulmonary rehab for nutrition, exercise, and lifestyle modifications.      Intervention Plan   Intervention Prescribe, educate and counsel regarding individualized specific dietary modifications aiming towards targeted core components such as weight, hypertension, lipid management, diabetes, heart failure and other comorbidities.;Nutrition handout(s) given to patient.    Expected Outcomes Short Term Goal: Understand basic principles of dietary content, such as calories, fat,  sodium, cholesterol and nutrients.;Long Term Goal: Adherence to prescribed nutrition plan.             Nutrition Assessments:  MEDIFICTS Score Key: >=70 Need to make dietary changes  40-70 Heart Healthy Diet <= 40 Therapeutic Level Cholesterol Diet   Picture Your Plate Scores: <81 Unhealthy dietary pattern with much room for improvement. 41-50 Dietary pattern unlikely to meet recommendations for good health and room for improvement. 51-60 More healthful dietary pattern, with some room for improvement.  >60 Healthy dietary pattern, although there may be some specific behaviors that could be improved.    Nutrition Goals Re-Evaluation:  Nutrition Goals Re-Evaluation     Row Name 04/02/23 1425 05/05/23 1052 06/02/23 1307         Goals   Current Weight 105 lb 2.6 oz (47.7 kg) 106 lb 4.2 oz (48.2 kg) 106 lb 4.2 oz (48.2 kg)     Comment lipids WNL (treated with repatha) lipids WNL (treated with repatha) lipids WNL (treated with repatha)     Expected Outcome Cheryl Haley reports history of weight loss with protein calorie malnutirtion with weight as low as 85# in July of 2023. She is hopeful to gain back to 115-120#. She prefers to eat "whatever" she wants but is receptive to increasing eating frequency to every 2-3 hours and implemented Boost/Ensure 1x/day. Cheryl Haley will continue to benefit from participation in pulmonary rehab for nutrition, exercise, and lifestyle modifications. Goals in action. Mijah reports history of weight loss with protein calorie malnutirtion with weight as low as 85# in July of 2023. She is hopeful to gain back to 115-120#. She prefers to eat "whatever" she wants but is receptive to increasing eating frequency to every 2-3 hours and implemented Boost/Ensure 1x/day. She is up 1.1# since starting with our program. Cheryl Haley will continue to benefit from participation in pulmonary rehab for nutrition, exercise, and lifestyle modifications. Goals in action. Cheryl Haley reports  history of weight loss with protein calorie malnutirtion with weight as low as 85# in July of 2023. She is hopeful to gain back to 115-120#. She prefers to eat "whatever" she wants but is receptive to increasing eating frequency to every 2-3 hours and implemented Boost/Ensure 1x/day. She is up 1.1# since starting with our program. Cheryl Haley will continue to benefit from participation in pulmonary rehab for nutrition, exercise, and lifestyle modifications.              Nutrition Goals Discharge (Final Nutrition Goals Re-Evaluation):  Nutrition Goals Re-Evaluation - 06/02/23 1307       Goals   Current Weight 106 lb 4.2 oz (48.2 kg)    Comment lipids WNL (treated with repatha)    Expected Outcome Goals in action. Cheryl Haley reports history of weight loss with protein calorie malnutirtion with weight as low as 85# in July of 2023. She is hopeful to gain back to 115-120#.  She prefers to eat "whatever" she wants but is receptive to increasing eating frequency to every 2-3 hours and implemented Boost/Ensure 1x/day. She is up 1.1# since starting with our program. Payzlie will continue to benefit from participation in pulmonary rehab for nutrition, exercise, and lifestyle modifications.             Psychosocial: Target Goals: Acknowledge presence or absence of significant depression and/or stress, maximize coping skills, provide positive support system. Participant is able to verbalize types and ability to use techniques and skills needed for reducing stress and depression.  Initial Review & Psychosocial Screening:  Initial Psych Review & Screening - 03/27/23 1045       Initial Review   Current issues with Current Psychotropic Meds   prescribed psychotropic meds for shingles     Family Dynamics   Good Support System? Yes      Barriers   Psychosocial barriers to participate in program There are no identifiable barriers or psychosocial needs.      Screening Interventions   Interventions  Encouraged to exercise             Quality of Life Scores:  Scores of 19 and below usually indicate a poorer quality of life in these areas.  A difference of  2-3 points is a clinically meaningful difference.  A difference of 2-3 points in the total score of the Quality of Life Index has been associated with significant improvement in overall quality of life, self-image, physical symptoms, and general health in studies assessing change in quality of life.  PHQ-9: Review Flowsheet       03/27/2023  Depression screen PHQ 2/9  Decreased Interest 0  Down, Depressed, Hopeless 0  PHQ - 2 Score 0  Altered sleeping 0  Tired, decreased energy 1  Change in appetite 0  Feeling bad or failure about yourself  0  Trouble concentrating 0  Moving slowly or fidgety/restless 0  Suicidal thoughts 0  PHQ-9 Score 1  Difficult doing work/chores Somewhat difficult    Details           Interpretation of Total Score  Total Score Depression Severity:  1-4 = Minimal depression, 5-9 = Mild depression, 10-14 = Moderate depression, 15-19 = Moderately severe depression, 20-27 = Severe depression   Psychosocial Evaluation and Intervention:  Psychosocial Evaluation - 03/27/23 1047       Psychosocial Evaluation & Interventions   Interventions Encouraged to exercise with the program and follow exercise prescription    Comments Emmaly denies any psychosocial barriers or concerns at this time    Expected Outcomes For Marianne to participate in PR free of any psychosocial barriers or concerns    Continue Psychosocial Services  No Follow up required             Psychosocial Re-Evaluation:  Psychosocial Re-Evaluation     Row Name 04/03/23 1127 04/29/23 1503 05/25/23 0849         Psychosocial Re-Evaluation   Current issues with None Identified None Identified None Identified     Comments No changes since orientation. Cheryl Haley has completed 1 class so far Cheryl Haley denies any psychosocial barriers or  concerns. Cheryl Haley continues to deny any psychosocial barriers or concerns.     Expected Outcomes For patient to attend PR without any psychosocial barriers or concerns For Cheryl Haley to continue to attend PR without any psychosocial barriers or concerns For Cheryl Haley to continue to attend PR without any psychosocial barriers or concerns     Interventions  Encouraged to attend Pulmonary Rehabilitation for the exercise Encouraged to attend Pulmonary Rehabilitation for the exercise Encouraged to attend Pulmonary Rehabilitation for the exercise     Continue Psychosocial Services  No Follow up required No Follow up required No Follow up required              Psychosocial Discharge (Final Psychosocial Re-Evaluation):  Psychosocial Re-Evaluation - 05/25/23 0849       Psychosocial Re-Evaluation   Current issues with None Identified    Comments Cheryl Haley continues to deny any psychosocial barriers or concerns.    Expected Outcomes For Cheryl Haley to continue to attend PR without any psychosocial barriers or concerns    Interventions Encouraged to attend Pulmonary Rehabilitation for the exercise    Continue Psychosocial Services  No Follow up required             Education: Education Goals: Education classes will be provided on a weekly basis, covering required topics. Participant will state understanding/return demonstration of topics presented.  Learning Barriers/Preferences:  Learning Barriers/Preferences - 03/27/23 1048       Learning Barriers/Preferences   Learning Barriers None    Learning Preferences Group Instruction;Individual Instruction             Education Topics: Know Your Numbers Group instruction that is supported by a PowerPoint presentation. Instructor discusses importance of knowing and understanding resting, exercise, and post-exercise oxygen saturation, heart rate, and blood pressure. Oxygen saturation, heart rate, blood pressure, rating of perceived exertion, and dyspnea are reviewed  along with a normal range for these values.    Exercise for the Pulmonary Patient Group instruction that is supported by a PowerPoint presentation. Instructor discusses benefits of exercise, core components of exercise, frequency, duration, and intensity of an exercise routine, importance of utilizing pulse oximetry during exercise, safety while exercising, and options of places to exercise outside of rehab.  Flowsheet Row PULMONARY REHAB OTHER RESPIRATORY from 05/28/2023 in [facility]  Date 05/28/23  Educator EP  Instruction Review Code 1- Verbalizes Understanding       MET Level  Group instruction provided by PowerPoint, verbal discussion, and written material to support subject matter. Instructor reviews what METs are and how to increase METs.  Flowsheet Row PULMONARY REHAB OTHER RESPIRATORY from 04/30/2023 in [facility], Vascular, [facility]  Date 04/30/23  Educator EP  Instruction Review Code 1- Verbalizes Understanding       Pulmonary Medications Verbally interactive group education provided by instructor with focus on inhaled medications and proper administration. Flowsheet Row PULMONARY REHAB OTHER RESPIRATORY from 05/21/2023 in [facility] Heart, Vascular, & Lung [facility]  Date 05/21/23  Educator RT  Instruction Review Code 1- Verbalizes Understanding       Anatomy and Physiology of the Respiratory System Group instruction provided by PowerPoint, verbal discussion, and written material to support subject matter. Instructor reviews respiratory cycle and anatomical components of the respiratory system and their functions. Instructor also reviews differences in obstructive and restrictive respiratory diseases with examples of each.  Flowsheet Row PULMONARY REHAB OTHER RESPIRATORY from 05/14/2023 in [facility] for Heart, Vascular, &  Lung Health  Date 05/14/23  Educator RT  Instruction Review Code 1- Verbalizes Understanding       Oxygen Safety Group instruction provided by PowerPoint, verbal discussion, and written material to support subject matter. There is an overview of "What is Oxygen" and "  Why do we need it".  Instructor also reviews how to create a safe environment for oxygen use, the importance of using oxygen as prescribed, and the risks of noncompliance. There is a brief discussion on traveling with oxygen and resources the patient may utilize.   Oxygen Use Group instruction provided by PowerPoint, verbal discussion, and written material to discuss how supplemental oxygen is prescribed and different types of oxygen supply systems. Resources for more information are provided.    Breathing Techniques Group instruction that is supported by demonstration and informational handouts. Instructor discusses the benefits of pursed lip and diaphragmatic breathing and detailed demonstration on how to perform both.  Flowsheet Row PULMONARY REHAB OTHER RESPIRATORY from 04/02/2023 in [facility], Vascular, & Lung [facility]  Date 04/02/23  Educator RN  Instruction Review Code 1- Verbalizes Understanding        Risk Factor Reduction Group instruction that is supported by a PowerPoint presentation. Instructor discusses the definition of a risk factor, different risk factors for pulmonary disease, and how the heart and lungs work together. Flowsheet Row PULMONARY REHAB OTHER RESPIRATORY from 04/23/2023 in [facility] for Heart, Vascular, & Lung Health  Date 04/23/23  Educator EP  Instruction Review Code 1- Verbalizes Understanding       Pulmonary Diseases Group instruction provided by PowerPoint, verbal discussion, and written material to support subject matter. Instructor gives an overview of the different type of pulmonary diseases. There is also a discussion on  risk factors and symptoms as well as ways to manage the diseases.   Stress and Energy Conservation Group instruction provided by PowerPoint, verbal discussion, and written material to support subject matter. Instructor gives an overview of stress and the impact it can have on the body. Instructor also reviews ways to reduce stress. There is also a discussion on energy conservation and ways to conserve energy throughout the day.   Warning Signs and Symptoms Group instruction provided by PowerPoint, verbal discussion, and written material to support subject matter. Instructor reviews warning signs and symptoms of stroke, heart attack, cold and flu. Instructor also reviews ways to prevent the spread of infection.   Other Education Group or individual verbal, written, or video instructions that support the educational goals of the pulmonary rehab program. Flowsheet Row PULMONARY REHAB OTHER RESPIRATORY from 05/07/2023 in [facility] for Heart, Vascular, & Lung Health  Date 05/07/23  Educator RT  Instruction Review Code 1- Verbalizes Understanding        Knowledge Questionnaire Score:  Knowledge Questionnaire Score - 03/27/23 1103       Knowledge Questionnaire Score   Pre Score 13/18             Core Components/Risk Factors/Patient Goals at Admission:  Personal Goals and Risk Factors at Admission - 03/27/23 1048       Core Components/Risk Factors/Patient Goals on Admission    Weight Management Weight Gain;Yes    Intervention Weight Management: Develop a combined nutrition and exercise program designed to reach desired caloric intake, while maintaining appropriate intake of nutrient and fiber, sodium and fats, and appropriate energy expenditure required for the weight goal.;Weight Management: Provide education and appropriate resources to help participant work on and attain dietary goals.;Weight Management/Obesity: Establish reasonable short term and long term  weight goals.;Obesity: Provide education and appropriate resources to help participant work on and attain dietary goals.    Admit Weight 105 lb 2.6 oz (47.7 kg)  Expected Outcomes Short Term: Continue to assess and modify interventions until short term weight is achieved;Long Term: Adherence to nutrition and physical activity/exercise program aimed toward attainment of established weight goal;Weight Gain: Understanding of general recommendations for a high calorie, high protein meal plan that promotes weight gain by distributing calorie intake throughout the day with the consumption for 4-5 meals, snacks, and/or supplements    Improve shortness of breath with ADL's Yes    Intervention Provide education, individualized exercise plan and daily activity instruction to help decrease symptoms of SOB with activities of daily living.    Expected Outcomes Short Term: Improve cardiorespiratory fitness to achieve a reduction of symptoms when performing ADLs;Long Term: Be able to perform more ADLs without symptoms or delay the onset of symptoms    Increase knowledge of respiratory medications and ability to use respiratory devices properly  Yes    Intervention Provide education and demonstration as needed of appropriate use of medications, inhalers, and oxygen therapy.    Expected Outcomes Short Term: Achieves understanding of medications use. Understands that oxygen is a medication prescribed by physician. Demonstrates appropriate use of inhaler and oxygen therapy.;Long Term: Maintain appropriate use of medications, inhalers, and oxygen therapy.    Hypertension Yes    Intervention Provide education on lifestyle modifcations including regular physical activity/exercise, weight management, moderate sodium restriction and increased consumption of fresh fruit, vegetables, and low fat dairy, alcohol moderation, and smoking cessation.;Monitor prescription use compliance.    Expected Outcomes Short Term: Continued  assessment and intervention until BP is < 140/72mm HG in hypertensive participants. < 130/72mm HG in hypertensive participants with diabetes, heart failure or chronic kidney disease.;Long Term: Maintenance of blood pressure at goal levels.             Core Components/Risk Factors/Patient Goals Review:   Goals and Risk Factor Review     Row Name 04/03/23 1129 04/29/23 1504 05/25/23 0905         Core Components/Risk Factors/Patient Goals Review   Personal Goals Review Improve shortness of breath with ADL's;Develop more efficient breathing techniques such as purse lipped breathing and diaphragmatic breathing and practicing self-pacing with activity.;Hypertension;Increase knowledge of respiratory medications and ability to use respiratory devices properly.  Weight Gain Improve shortness of breath with ADL's;Develop more efficient breathing techniques such as purse lipped breathing and diaphragmatic breathing and practicing self-pacing with activity.;Hypertension;Increase knowledge of respiratory medications and ability to use respiratory devices properly.;Weight Management/Obesity  Weight Gain Improve shortness of breath with ADL's;Weight Management/Obesity  Weight Gain     Review Unable to determine progress, Tauriel has completed 1 class so far. Goal in progress for weight gain. Cheryl Haley has not had success yet in gaining weight but is still working with our dietician to increase meal frequency and add high calorie protein shakes. Goal in progress on developing more efficient breathing techniques such as purse lipped breathing and diaphragmatic breathing; and practicing self-pacing with activity. We have been working & teaching Cheryl Haley on how to perform PLB and pacing herself while walking the track. Goal in progress on improving her shortness of breath with ADLs. Cheryl Haley has maintained her oxygen saturation >88% while exercising on 2L [place]. Goal met for hypertension control. For the past 6 sessions Cheryl Haley blood  pressures have been WNL. She is compliant in taking her Toprol-XL for BP control. Goal also met for increasing her knowledge of respiratory medications and ability to use respiratory devices properly. She has correctly demonstrated and voiced when to use her medications with our  respiratory therapist. She will continue to benefit from PR for nutrition, education, exercise, and lifestyle modification. Goal in progress for weight gain. Cheryl Haley has not had success yet in gaining weight but is still working with our dietician to increase meal frequency and adding high calorie protein shakes. She is up ~1# since starting. Goal met on developing more efficient breathing techniques such as purse lipped breathing and diaphragmatic breathing; and practicing self-pacing with activity. Cheryl Haley can initiate PLB and can self pace herself while walking the track. Goal in progress on improving her shortness of breath with ADLs. Cheryl Haley has maintained her oxygen saturation >88% while exercising on 2L [place]. Cheryl Haley will continue to benefit from PR for nutrition, education, exercise, and lifestyle modification.     Expected Outcomes See admission goals For Cheryl Haley to gain weight, improve her shortness of breath with ADLs, to develop more efficient breathing techniques, and increase her knowledge of respiratory medications. For Cheryl Haley to gain weight and improve her shortness of breath with ADLs.              Core Components/Risk Factors/Patient Goals at Discharge (Final Review):   Goals and Risk Factor Review - 05/25/23 0905       Core Components/Risk Factors/Patient Goals Review   Personal Goals Review Improve shortness of breath with ADL's;Weight Management/Obesity   Weight Gain   Review Goal in progress for weight gain. Cheryl Haley has not had success yet in gaining weight but is still working with our dietician to increase meal frequency and adding high calorie protein shakes. She is up ~1# since starting. Goal met on developing more  efficient breathing techniques such as purse lipped breathing and diaphragmatic breathing; and practicing self-pacing with activity. Cheryl Haley can initiate PLB and can self pace herself while walking the track. Goal in progress on improving her shortness of breath with ADLs. Cheryl Haley has maintained her oxygen saturation >88% while exercising on 2L [place]. Cheryl Haley will continue to benefit from PR for nutrition, education, exercise, and lifestyle modification.    Expected Outcomes For Cheryl Haley to gain weight and improve her shortness of breath with ADLs.             ITP Comments: Pt is making expected progress toward Pulmonary Rehab goals after completing 16 sessions. Recommend continued exercise, life style modification, education, and utilization of breathing techniques to increase stamina and strength, while also decreasing shortness of breath with exertion.  Dr. Mechele Collin is Medical Director for Pulmonary Rehab at [facility].

## 2023-06-04 ENCOUNTER — Encounter (HOSPITAL_COMMUNITY)
Admission: RE | Admit: 2023-06-04 | Discharge: 2023-06-04 | Disposition: A | Discharge status: Home / Self Care | Payer: Medicare Other | Source: Ambulatory Visit | Attending: Pulmonary Disease | Admitting: Pulmonary Disease

## 2023-06-04 DIAGNOSIS — J9611 Chronic respiratory failure with hypoxia: Secondary | ICD-10-CM | POA: Diagnosis not present

## 2023-06-04 NOTE — Progress Notes (Signed)
Daily Session Note  Patient Details  Name: Lanasia Porras MRN: [id number] Date of Birth: [birth year] Referring Provider:   Doristine Devoid Pulmonary Rehab Walk Test from 03/27/2023 in [facility], Vascular, & [facility]  Referring Provider Icard       Encounter Date: 06/04/2023  Check In:  Session Check In - 06/04/23 0843       Check-In   Supervising physician immediately available to respond to emergencies [facility] MD immediately available    Physician(s) Elizabeth Sauer, NP    Location [facility]    Staff Present Essie Hart, RN, BSN;Randi Idelle Crouch BS, ACSM-CEP, Exercise Physiologist;Zaakirah Kistner Earlene Plater, MS, ACSM-CEP, Exercise Physiologist;Casey Katrinka Blazing, RT    Virtual Visit No    Medication changes reported     No    Fall or balance concerns reported    No    Tobacco Cessation No Change    Warm-up and Cool-down Performed as group-led instruction    Resistance Training Performed Yes    VAD Patient? No      Pain Assessment   Currently in Pain? No/denies             Capillary Blood Glucose: No results found for this or any previous visit (from the past 24 hour(s)).    Social History   Tobacco Use  Smoking Status Former   Current packs/day: 0.00   Average packs/day: 2.0 packs/day for 40.0 years (80.0 ttl pk-yrs)   Types: Cigarettes   Start date: 03/16/1977   Quit date: 03/16/2017   Years since quitting: 6.2  Smokeless Tobacco Never  Tobacco Comments        Goals Met:  Proper associated with RPD/PD & O2 Sat Independence with exercise equipment Exercise tolerated well No report of concerns or symptoms today Strength training completed today  Goals Unmet:  Not Applicable  Comments: Service time is from 0815 to [phone].    Dr. Mechele Collin is Medical Director for Pulmonary Rehab at [facility].

## 2023-06-09 ENCOUNTER — Encounter (HOSPITAL_COMMUNITY)
Admission: RE | Admit: 2023-06-09 | Discharge: 2023-06-09 | Disposition: A | Discharge status: Home / Self Care | Payer: Medicare Other | Source: Ambulatory Visit | Attending: Pulmonary Disease

## 2023-06-09 DIAGNOSIS — J9611 Chronic respiratory failure with hypoxia: Secondary | ICD-10-CM

## 2023-06-09 NOTE — Progress Notes (Signed)
Daily Session Note  Patient Details  Name: Cheryl Haley MRN: [id number] Date of Birth: 1956-06-26 Referring Provider:   Doristine Devoid Pulmonary Rehab Walk Test from 03/27/2023 in [facility] for Heart, Vascular, & Lung Health  Referring Provider Icard       Encounter Date: 06/09/2023  Check In:  Session Check In - 06/09/23 0816       Check-In   Supervising physician immediately available to respond to emergencies [facility] immediately available    Physician(s) Joni Reining, NP    [facility]    Staff Present Elissa Lovett BS, ACSM-CEP, Exercise Physiologist;Kaylee Earlene Plater, MS, ACSM-CEP, Exercise Physiologist;Porter Nakama Katrinka Blazing, RT    Virtual Visit No    Medication changes reported     No    Fall or balance concerns reported    No    Tobacco Cessation No Change    Warm-up and Cool-down Performed as group-led instruction    Resistance Training Performed Yes    VAD Patient? No    PAD/SET Patient? No      Pain Assessment   Currently in Pain? No/denies    Pain Score 0-No pain    Multiple Pain Sites No             Capillary Blood Glucose: No results found for this or any previous visit (from the past 24 hour(s)).    Social History   Tobacco Use  Smoking Status Former   Current packs/day: 0.00   Average packs/day: 2.0 packs/day for 40.0 years (80.0 ttl pk-yrs)   Types: Cigarettes   Start date: 03/16/1977   Quit date: 03/16/2017   Years since quitting: 6.2  Smokeless Tobacco Never  Tobacco Comments        Goals Met:  Proper associated with RPD/PD & O2 Sat Independence with exercise equipment Exercise tolerated well No report of concerns or symptoms today Strength training completed today  Goals Unmet:  Not Applicable  Comments: Service time is from 0808 to 0910.    Dr. Mechele Collin is Medical Director for Pulmonary Rehab at [facility].

## 2023-06-11 ENCOUNTER — Encounter (HOSPITAL_COMMUNITY)
Admission: RE | Admit: 2023-06-11 | Discharge: 2023-06-11 | Disposition: A | Discharge status: Home / Self Care | Payer: Medicare Other | Source: Ambulatory Visit | Attending: Pulmonary Disease | Admitting: Pulmonary Disease

## 2023-06-11 ENCOUNTER — Encounter: Payer: Self-pay | Admitting: *Deleted

## 2023-06-11 DIAGNOSIS — J9611 Chronic respiratory failure with hypoxia: Secondary | ICD-10-CM | POA: Diagnosis not present

## 2023-06-11 NOTE — Progress Notes (Signed)
ATC x1.  LVM to return call.  Mychart message sent.

## 2023-06-11 NOTE — Progress Notes (Signed)
Daily Session Note  Patient Details  Name: Cheryl Haley MRN: [id number] Date of Birth: 1956-07-08 Referring Provider:   Doristine Devoid Pulmonary Rehab Walk Test from 03/27/2023 in [facility], Vascular, & Lung [facility]  Referring Provider Icard       Encounter Date: 06/11/2023  Check In:  Session Check In - 06/11/23 0830       Check-In   Supervising physician immediately available to respond to emergencies [facility] immediately available    Physician(s) Jari Favre, PA    [facility]    Staff Present Elissa Lovett BS, ACSM-CEP, Exercise Physiologist;Avaiah Stempel Earlene Plater, MS, ACSM-CEP, Exercise Physiologist;Casey Synthia Innocent, RN, BSN;Samantha Belarus, RD, LDN    Virtual Visit No    Medication changes reported     No    Fall or balance concerns reported    No    Tobacco Cessation No Change    Warm-up and Cool-down Performed as group-led instruction    Resistance Training Performed Yes    VAD Patient? No    PAD/SET Patient? No      Pain Assessment   Currently in Pain? No/denies             Capillary Blood Glucose: No results found for this or any previous visit (from the past 24 hour(s)).    Social History   Tobacco Use  Smoking Status Former   Current packs/day: 0.00   Average packs/day: 2.0 packs/day for 40.0 years (80.0 ttl pk-yrs)   Types: Cigarettes   Start date: 03/16/1977   Quit date: 03/16/2017   Years since quitting: 6.2  Smokeless Tobacco Never  Tobacco Comments        Goals Met:  Proper associated with RPD/PD & O2 Sat Independence with exercise equipment Exercise tolerated well No report of concerns or symptoms today Strength training completed today  Goals Unmet:  Not Applicable  Comments: Service time is from 0812 to 0925.    Dr. Mechele Collin is Medical Director for Pulmonary Rehab at [facility].

## 2023-06-16 ENCOUNTER — Encounter (HOSPITAL_COMMUNITY)
Admission: RE | Admit: 2023-06-16 | Discharge: 2023-06-16 | Disposition: A | Discharge status: Home / Self Care | Payer: Medicare Other | Source: Ambulatory Visit | Attending: Pulmonary Disease | Admitting: Pulmonary Disease

## 2023-06-16 VITALS — Wt 107.6 lb

## 2023-06-16 DIAGNOSIS — J9611 Chronic respiratory failure with hypoxia: Secondary | ICD-10-CM

## 2023-06-16 NOTE — Progress Notes (Addendum)
Daily Session Note  Patient Details  Name: Cheryl Haley MRN: [id number] Date of Birth: 1956/01/30 Referring Provider:   Doristine Devoid Pulmonary Rehab Walk Test from 03/27/2023 in [facility], Vascular, & Lung [facility]  Referring Provider Icard       Encounter Date: 06/16/2023  Check In:  Session Check In - 06/16/23 0630       Check-In   Supervising physician immediately available to respond to emergencies [facility] immediately available    Physician(s) Jari Favre, PA    [facility]    Staff Present Elissa Lovett BS, ACSM-CEP, Exercise Physiologist;Decklan Mau Synthia Innocent, RN, BSN    Virtual Visit No    Medication changes reported     No    Fall or balance concerns reported    No    Tobacco Cessation No Change    Warm-up and Cool-down Performed as group-led instruction    Resistance Training Performed Yes    VAD Patient? No    PAD/SET Patient? No      Pain Assessment   Currently in Pain? No/denies    Multiple Pain Sites No             Capillary Blood Glucose: No results found for this or any previous visit (from the past 24 hour(s)).   Exercise Prescription Changes - 06/16/23 0900       Response to Exercise   Blood Pressure (Admit) 112/64    Blood Pressure (Exercise) 132/64    Blood Pressure (Exit) 112/60    Heart Rate (Admit) 84 bpm    Heart Rate (Exercise) 92 bpm    Heart Rate (Exit) 95 bpm    Oxygen Saturation (Admit) 99 %    Oxygen Saturation (Exercise) 96 %    Oxygen Saturation (Exit) 98 %    Rating of Perceived Exertion (Exercise) 13    Perceived Dyspnea (Exercise) 3    Duration Continue with 30 min of aerobic exercise without signs/symptoms of physical distress.    Intensity THRR unchanged      Progression   Progression Continue to progress workloads to maintain intensity without signs/symptoms of physical distress.      Resistance Training   Training Prescription Yes    Weight blue  bands    Reps 10-15    Time 10 Minutes      Oxygen   Oxygen Continuous    Liters 2      NuStep   Level 3    SPM 71    Minutes 15    METs 2.4      Track   Laps 7    Minutes 15    METs 2.08      Oxygen   Maintain Oxygen Saturation 88% or higher             Social History   Tobacco Use  Smoking Status Former   Current packs/day: 0.00   Average packs/day: 2.0 packs/day for 40.0 years (80.0 ttl pk-yrs)   Types: Cigarettes   Start date: 03/16/1977   Quit date: 03/16/2017   Years since quitting: 6.2  Smokeless Tobacco Never  Tobacco Comments        Goals Met:  Proper associated with RPD/PD & O2 Sat Independence with exercise equipment Exercise tolerated well No report of concerns or symptoms today Strength training completed today  Goals Unmet:  Not Applicable  Comments: Service time is from 0807 to 0930.  Dr. Mechele Collin is Medical Director  for Pulmonary Rehab at [facility].

## 2023-06-18 ENCOUNTER — Encounter (HOSPITAL_COMMUNITY)
Admission: RE | Admit: 2023-06-18 | Discharge: 2023-06-18 | Disposition: A | Discharge status: Home / Self Care | Payer: Medicare Other | Source: Ambulatory Visit | Attending: Pulmonary Disease | Admitting: Pulmonary Disease

## 2023-06-18 DIAGNOSIS — J9611 Chronic respiratory failure with hypoxia: Secondary | ICD-10-CM

## 2023-06-18 NOTE — Progress Notes (Signed)
Daily Session Note  Patient Details  Name: Cheryl Haley MRN: [id number] Date of Birth: [birth year] Referring Provider:   Doristine Devoid Pulmonary Rehab Walk Test from 03/27/2023 in [facility] Heart, Vascular, & Lung Health  Referring Provider Icard       Encounter Date: 06/18/2023  Check In:  Session Check In - 06/18/23 0834       Check-In   Supervising physician immediately available to respond to emergencies [facility] immediately available    Physician(s) Jari Favre, PA    [facility]    Staff Present Elissa Lovett BS, ACSM-CEP, Exercise Physiologist;Oneida Mckamey Synthia Innocent, RN, BSN;Samantha Belarus, RD, Rexene Agent, MS, ACSM-CEP, Exercise Physiologist    Virtual Visit No    Medication changes reported     No    Fall or balance concerns reported    No    Tobacco Cessation No Change    Warm-up and Cool-down Performed as group-led instruction    Resistance Training Performed Yes    VAD Patient? No    PAD/SET Patient? No      Pain Assessment   Currently in Pain? No/denies    Multiple Pain Sites No             Capillary Blood Glucose: No results found for this or any previous visit (from the past 24 hour(s)).    Social History   Tobacco Use  Smoking Status Former   Current packs/day: 0.00   Average packs/day: 2.0 packs/day for 40.0 years (80.0 ttl pk-yrs)   Types: Cigarettes   Start date: 03/16/1977   Quit date: 03/16/2017   Years since quitting: 6.2  Smokeless Tobacco Never  Tobacco Comments        Goals Met:  Proper associated with RPD/PD & O2 Sat Independence with exercise equipment Exercise tolerated well No report of concerns or symptoms today Strength training completed today  Goals Unmet:  Not Applicable  Comments: Service time is from 0807 to 0926.

## 2023-06-23 ENCOUNTER — Encounter (HOSPITAL_COMMUNITY)
Admission: RE | Admit: 2023-06-23 | Discharge: 2023-06-23 | Disposition: A | Discharge status: Home / Self Care | Payer: Medicare Other | Source: Ambulatory Visit | Attending: Pulmonary Disease | Admitting: Pulmonary Disease

## 2023-06-23 DIAGNOSIS — J9611 Chronic respiratory failure with hypoxia: Secondary | ICD-10-CM | POA: Diagnosis not present

## 2023-06-23 NOTE — Progress Notes (Signed)
Daily Session Note  Patient Details  Name: Cheryl Haley MRN: [id number] Date of Birth: 1956/06/16 Referring Provider:   Doristine Devoid Pulmonary Rehab Walk Test from 03/27/2023 in [facility] for Heart, Vascular, & Lung Health  Referring Provider Icard       Encounter Date: 06/23/2023  Check In:  Session Check In - 06/23/23 6962       Check-In   Supervising physician immediately available to respond to emergencies [facility] immediately available    Physician(s) Edd Fabian, NP    [facility]    Staff Present Elissa Lovett BS, ACSM-CEP, Exercise Physiologist;Casey Synthia Innocent, RN, Doris Cheadle, MS, ACSM-CEP, Exercise Physiologist    Virtual Visit No    Medication changes reported     No    Fall or balance concerns reported    No    Tobacco Cessation No Change    Warm-up and Cool-down Performed as group-led instruction    Resistance Training Performed Yes    VAD Patient? No    PAD/SET Patient? No      Pain Assessment   Currently in Pain? No/denies    Multiple Pain Sites No             Capillary Blood Glucose: No results found for this or any previous visit (from the past 24 hour(s)).    Social History   Tobacco Use  Smoking Status Former   Current packs/day: 0.00   Average packs/day: 2.0 packs/day for 40.0 years (80.0 ttl pk-yrs)   Types: Cigarettes   Start date: 03/16/1977   Quit date: 03/16/2017   Years since quitting: 6.2  Smokeless Tobacco Never  Tobacco Comments        Goals Met:  Proper associated with RPD/PD & O2 Sat Independence with exercise equipment Exercise tolerated well No report of concerns or symptoms today Strength training completed today  Goals Unmet:  Not Applicable  Comments: Service time is from 0813 to 0931.

## 2023-06-24 ENCOUNTER — Other Ambulatory Visit: Payer: Self-pay | Admitting: Adult Health

## 2023-06-25 ENCOUNTER — Encounter (HOSPITAL_COMMUNITY)
Admission: RE | Admit: 2023-06-25 | Discharge: 2023-06-25 | Disposition: A | Discharge status: Home / Self Care | Payer: Medicare Other | Source: Ambulatory Visit | Attending: Pulmonary Disease | Admitting: Pulmonary Disease

## 2023-06-25 DIAGNOSIS — J9611 Chronic respiratory failure with hypoxia: Secondary | ICD-10-CM

## 2023-06-25 NOTE — Progress Notes (Signed)
Daily Session Note  Patient Details  Name: Cheryl Haley MRN: [id number] Date of Birth: 1956/03/09 Referring Provider:   Doristine Devoid Pulmonary Rehab Walk Test from 03/27/2023 in [facility] for Heart, Vascular, & Lung Health  Referring Provider Icard       Encounter Date: 06/25/2023  Check In:  Session Check In - 06/25/23 8413       Check-In   Supervising physician immediately available to respond to emergencies [facility] immediately available    Physician(s) Eligha Bridegroom, NP    [facility]    Staff Present Elissa Lovett BS, ACSM-CEP, Exercise Physiologist;Casey Synthia Innocent, RN, Doris Cheadle, MS, ACSM-CEP, Exercise Physiologist    Virtual Visit No    Medication changes reported     No    Fall or balance concerns reported    No    Tobacco Cessation No Change    Warm-up and Cool-down Performed as group-led instruction    Resistance Training Performed Yes    VAD Patient? No    PAD/SET Patient? No      Pain Assessment   Currently in Pain? No/denies    Multiple Pain Sites No             Capillary Blood Glucose: No results found for this or any previous visit (from the past 24 hour(s)).    Social History   Tobacco Use  Smoking Status Former   Current packs/day: 0.00   Average packs/day: 2.0 packs/day for 40.0 years (80.0 ttl pk-yrs)   Types: Cigarettes   Start date: 03/16/1977   Quit date: 03/16/2017   Years since quitting: 6.2  Smokeless Tobacco Never  Tobacco Comments        Goals Met:  Proper associated with RPD/PD & O2 Sat Independence with exercise equipment Exercise tolerated well No report of concerns or symptoms today Strength training completed today  Goals Unmet:  Not Applicable  Comments: Service time is from 0811 to 0941.  Dr. Mechele Collin is Medical Director for Pulmonary Rehab at [facility].

## 2023-06-27 ENCOUNTER — Other Ambulatory Visit: Payer: Self-pay

## 2023-06-27 ENCOUNTER — Encounter (HOSPITAL_BASED_OUTPATIENT_CLINIC_OR_DEPARTMENT_OTHER): Payer: Self-pay

## 2023-06-27 ENCOUNTER — Emergency Department (HOSPITAL_BASED_OUTPATIENT_CLINIC_OR_DEPARTMENT_OTHER)
Admission: EM | Admit: 2023-06-27 | Discharge: 2023-06-27 | Disposition: A | Discharge status: Home / Self Care | Payer: Medicare Other | Attending: Emergency Medicine | Admitting: Emergency Medicine

## 2023-06-27 ENCOUNTER — Emergency Department (HOSPITAL_BASED_OUTPATIENT_CLINIC_OR_DEPARTMENT_OTHER): Payer: Medicare Other

## 2023-06-27 DIAGNOSIS — Z20822 Contact with and (suspected) exposure to covid-19: Secondary | ICD-10-CM | POA: Diagnosis not present

## 2023-06-27 DIAGNOSIS — I10 Essential (primary) hypertension: Secondary | ICD-10-CM | POA: Diagnosis not present

## 2023-06-27 DIAGNOSIS — Z7952 Long term (current) use of systemic steroids: Secondary | ICD-10-CM | POA: Insufficient documentation

## 2023-06-27 DIAGNOSIS — Z87891 Personal history of nicotine dependence: Secondary | ICD-10-CM | POA: Insufficient documentation

## 2023-06-27 DIAGNOSIS — Z7982 Long term (current) use of aspirin: Secondary | ICD-10-CM | POA: Insufficient documentation

## 2023-06-27 DIAGNOSIS — E871 Hypo-osmolality and hyponatremia: Secondary | ICD-10-CM | POA: Diagnosis not present

## 2023-06-27 DIAGNOSIS — Z79899 Other long term (current) drug therapy: Secondary | ICD-10-CM | POA: Insufficient documentation

## 2023-06-27 DIAGNOSIS — R0602 Shortness of breath: Secondary | ICD-10-CM | POA: Diagnosis present

## 2023-06-27 DIAGNOSIS — Z7951 Long term (current) use of inhaled steroids: Secondary | ICD-10-CM | POA: Diagnosis not present

## 2023-06-27 DIAGNOSIS — E039 Hypothyroidism, unspecified: Secondary | ICD-10-CM | POA: Diagnosis not present

## 2023-06-27 DIAGNOSIS — R0600 Dyspnea, unspecified: Secondary | ICD-10-CM

## 2023-06-27 DIAGNOSIS — E878 Other disorders of electrolyte and fluid balance, not elsewhere classified: Secondary | ICD-10-CM | POA: Insufficient documentation

## 2023-06-27 DIAGNOSIS — J441 Chronic obstructive pulmonary disease with (acute) exacerbation: Secondary | ICD-10-CM | POA: Diagnosis not present

## 2023-06-27 LAB — BASIC METABOLIC PANEL
Anion gap: 10 (ref 5–15)
BUN: 11 mg/dL (ref 8–23)
CO2: 29 mmol/L (ref 22–32)
Calcium: 9.3 mg/dL (ref 8.9–10.3)
Chloride: 95 mmol/L — ABNORMAL LOW (ref 98–111)
Creatinine, Ser: 0.79 mg/dL (ref 0.44–1.00)
GFR, Estimated: >60 mL/min (ref >60)
Glucose, Bld: 136 mg/dL — ABNORMAL HIGH (ref 70–99)
Potassium: 4.6 mmol/L (ref 3.5–5.1)
Sodium: 134 mmol/L — ABNORMAL LOW (ref 135–145)

## 2023-06-27 LAB — RESP PANEL BY RT-PCR (RSV, FLU A&B, COVID)  RVPGX2
Influenza A by PCR: NEGATIVE
Influenza B by PCR: NEGATIVE
Resp Syncytial Virus by PCR: NEGATIVE
SARS Coronavirus 2 by RT PCR: NEGATIVE

## 2023-06-27 LAB — CBC
HCT: 40.7 % (ref 36.0–46.0)
Hemoglobin: 13.1 g/dL (ref 12.0–15.0)
MCH: 30.8 pg (ref 26.0–34.0)
MCHC: 32.2 g/dL (ref 30.0–36.0)
MCV: 95.5 fL (ref 80.0–100.0)
Platelets: 336 10*3/uL (ref 150–400)
RBC: 4.26 MIL/uL (ref 3.87–5.11)
RDW: 14.6 % (ref 11.5–15.5)
WBC: 5.8 10*3/uL (ref 4.0–10.5)
nRBC: 0 % (ref 0.0–0.2)

## 2023-06-27 LAB — TROPONIN I (HIGH SENSITIVITY): Troponin I (High Sensitivity): 2 ng/L (ref <18)

## 2023-06-27 MED ORDER — ALBUTEROL SULFATE HFA 108 (90 BASE) MCG/ACT IN AERS: 2.0000 | INHALATION_SPRAY | RESPIRATORY_TRACT | Status: DC | PRN

## 2023-06-27 MED ORDER — PREDNISONE 20 MG PO TABS: 40.0000 mg | ORAL_TABLET | Freq: Every day | ORAL | 0 refills | Status: DC

## 2023-06-27 MED ORDER — PREDNISONE 10 MG PO TABS: ORAL_TABLET | ORAL | 0 refills | Status: AC

## 2023-06-27 MED ORDER — IPRATROPIUM-ALBUTEROL 0.5-2.5 (3) MG/3ML IN SOLN
3.0000 mL | Freq: Once | RESPIRATORY_TRACT | Status: AC
Start: 2023-06-27 — End: 2023-06-27
  Administered 2023-06-27: 3 mL via RESPIRATORY_TRACT
  Filled 2023-06-27: qty 3

## 2023-06-27 MED ORDER — ALBUTEROL SULFATE (2.5 MG/3ML) 0.083% IN NEBU
2.5000 mg | INHALATION_SOLUTION | Freq: Once | RESPIRATORY_TRACT | Status: AC
  Administered 2023-06-27: 2.5 mg via RESPIRATORY_TRACT
  Filled 2023-06-27: qty 3

## 2023-06-27 NOTE — ED Notes (Signed)
Attempted transport to imaging, patient receiving breathing treatment.

## 2023-06-27 NOTE — ED Provider Notes (Signed)
[facility] [facility] AT [facility] Provider Note   CSN: [id number] Arrival date & time: 06/27/23  1529     History {Add pertinent medical, surgical, social history, OB history to HPI:1} Chief Complaint  Patient presents with   Shortness of Breath    Cheryl Haley is a 67 y.o. female.   Shortness of Breath   67 year old female presents emergency department with complaints of shortness of breath.  Patient states that she has been more short of breath the past couple of days.  Patient with history of COPD on 2 L on her at home oxygen concentrator with 3 L nasal cannula and she is out of her house.  States that she just recently "graduated from pulmonary rehab."  States that since then, has noticed slightly worsening shortness of breath.  Denies any fever, chills, sore throat, body aches, chest pain, abdominal pain, nausea, vomiting.  States she tried to get in touch with her pulmonary doctor but could not get an appointment for several weeks prompting visit to the emergency department.  Past medical history significant for chronic respiratory failure on 3 L chronically, COPD, lung nodules, cavitary right upper lobe lesion, hypertension, hyperlipidemia, hypothyroidism, essential thrombocytopenia  Home Medications Prior to Admission medications   Medication Sig Start Date End Date Taking? Authorizing Provider  albuterol (PROVENTIL) (2.5 MG/3ML) 0.083% nebulizer solution Take 3 mLs (2.5 mg total) by nebulization every 6 (six) hours as needed for wheezing or shortness of breath. 03/09/23 03/08/24  Parrett, Virgel Bouquet, NP  albuterol (VENTOLIN HFA) 108 (90 Base) MCG/ACT inhaler Inhale 2 puffs into the lungs every 6 (six) hours as needed for wheezing or shortness of breath. 04/14/22   Josephine Igo, DO  alendronate (FOSAMAX) 70 MG tablet Take 70 mg by mouth every Saturday. 06/13/21   [provider]  amitriptyline (ELAVIL) 50 MG tablet Take 50 mg by mouth at bedtime.     [provider]  aspirin EC 81 MG tablet Take 81 mg daily by mouth.     [provider]  Budeson-Glycopyrrol-Formoterol (BREZTRI AEROSPHERE) 160-9-4.8 MCG/ACT AERO INHALE 2 PUFFS TWICE DAILY 12/09/22   Icard, Rachel Bo, DO  DALIRESP 500 MCG TABS tablet Take 1 tablet by mouth once daily 11/10/22   Kalman Shan, MD  Evolocumab (REPATHA SURECLICK) 140 MG/ML SOAJ Inject 140 mg into the skin every 14 (fourteen) days. 09/02/22   Quintella Reichert, MD  hydroxyurea (HYDREA) 500 MG capsule TAKE ONE CAPSULE BY MOUTH EVERY OTHER DAY WITH FOOD TO MINIMIZE GI SIDE EFFECTS 06/03/23   Rushie Chestnut, PA-C  levothyroxine (SYNTHROID) 75 MCG tablet Take 75 mcg by mouth every morning. 04/24/21   [provider]  metoprolol succinate (TOPROL-XL) 25 MG 24 hr tablet Take 1 tablet (25 mg total) by mouth daily. 09/15/22   Quintella Reichert, MD  Multiple Vitamin (MULTIVITAMIN WITH MINERALS) TABS tablet Take 1 tablet by mouth daily.    [provider]  OXYGEN Inhale 2 L into the lungs continuous.    [provider]  predniSONE (DELTASONE) 5 MG tablet Take 1 tablet by mouth once daily with breakfast 04/30/23   Parrett, Tammy S, NP  roflumilast (DALIRESP) 500 MCG TABS tablet Take 1 tablet (500 mcg total) by mouth daily. 12/19/22   Kalman Shan, MD  rosuvastatin (CRESTOR) 40 MG tablet Take 1 tablet by mouth once daily 08/04/22   Quintella Reichert, MD      Allergies    Bisoprolol fumarate  Review of Systems   Review of Systems  Respiratory:  Positive for shortness of breath.   All other systems reviewed and are negative.   Physical Exam Updated Vital Signs Ht 5\' 4"  (1.626 m)   Wt 49 kg   BMI 18.54 kg/m  Physical Exam Vitals and nursing note reviewed.  Constitutional:      General: She is not in acute distress.    Appearance: She is well-developed.  HENT:     Head: Normocephalic and atraumatic.  Eyes:     Conjunctiva/sclera: Conjunctivae normal.  Cardiovascular:      Rate and Rhythm: Normal rate and regular rhythm.     Pulses: Normal pulses.  Pulmonary:     Effort: Pulmonary effort is normal.     Comments: Patient with diffuse decreased lung sounds with faint expiratory wheeze appreciated middle lung field. Abdominal:     Palpations: Abdomen is soft.     Tenderness: There is no abdominal tenderness.  Musculoskeletal:        General: No swelling.     Cervical back: Neck supple.     Right lower leg: No edema.     Left lower leg: No edema.  Skin:    General: Skin is warm and dry.     Capillary Refill: Capillary refill takes less than 2 seconds.  Neurological:     Mental Status: She is alert.  Psychiatric:        Mood and Affect: Mood normal.     ED Results / Procedures / Treatments   Labs (all labs ordered are listed, but only abnormal results are displayed) Labs Reviewed - No data to display  EKG None  Radiology No results found.  Procedures Procedures  {Document cardiac monitor, telemetry assessment procedure when appropriate:1}  Medications Ordered in ED Medications - No data to display  ED Course/ Medical Decision Making/ A&P   {   Click here for ABCD2, HEART and other calculatorsREFRESH Note before signing :1}                              Medical Decision Making Amount and/or Complexity of Data Reviewed Labs: ordered. Radiology: ordered.  Risk Prescription drug management.   This patient presents to the ED for concern of shortness of breath, this involves an extensive number of treatment options, and is a complaint that carries with it a high risk of complications and morbidity.  The differential diagnosis includes The causes for shortness of breath include but are not limited to Cardiac (AHF, pericardial effusion and tamponade, arrhythmias, ischemia, etc) Respiratory (COPD, asthma, pneumonia, pneumothorax, primary pulmonary hypertension, PE/VQ mismatch) Hematological (anemia)  Co morbidities that complicate the  patient evaluation  See HPI   Additional history obtained:  Additional history obtained from EMR External records from outside source obtained and reviewed including hospital records   Lab Tests:  I Ordered, and personally interpreted labs.  The pertinent results include: No leukocytosis.  No evidence of anemia.  Platelets within normal range.  Mild hyponatremia and hypochloremia to 3105 respectively.  Otherwise electrolytes within normal range.  No renal dysfunction.  Respiratory viral panel negative.  Initial troponin less than 2 repeat pending   Imaging Studies ordered:  I ordered imaging studies including chest x-ray I independently visualized and interpreted imaging which showed no acute cardiopulmonary events.  Stable I agree with the radiologist interpretation   Cardiac Monitoring: / EKG:  The patient was maintained on a  cardiac monitor.  I personally viewed and interpreted the cardiac monitored which showed an underlying rhythm of: Sinus rhythm.  Decrease in RUE.  Left history of fascicular block.  ST elevation laterally  Consultations Obtained:  I requested consultation with  physician Dr. Andria Meuse significant treatment plan going forward   Problem List / ED Course / Critical interventions / Medication management  COPD exacerbation, shortness of breath I ordered medication including albuterol, DuoNeb   Reevaluation of the patient after these medicines showed that the patient improved I have reviewed the patients home medicines and have made adjustments as needed   Social Determinants of Health:  Former cigarette use.  Denies illicit drug use.   Test / Admission - Considered:  COPD exacerbation, shortness of breath Vitals signs significant for ***. Otherwise within normal range and stable throughout visit. Laboratory/imaging studies significant for: See above *** Worrisome signs and symptoms were discussed with the patient, and the patient acknowledged  understanding to return to the ED if noticed. Patient was stable upon discharge.    {Document critical care time when appropriate:1} {Document review of labs and clinical decision tools ie heart score, Chads2Vasc2 etc:1}  {Document your independent review of radiology images, and any outside records:1} {Document your discussion with family members, caretakers, and with consultants:1} {Document social determinants of health affecting pt's care:1} {Document your decision making why or why not admission, treatments were needed:1} Final Clinical Impression(s) / ED Diagnoses Final diagnoses:  None    Rx / DC Orders ED Discharge Orders     None

## 2023-06-27 NOTE — Discharge Instructions (Signed)
As discussed, with history of reassuring.  X-ray is negative for signs.  Your heart enzyme was normal and EKG appeared similar to other EKGs in the past.  Suspect your symptoms are likely secondary to underlying COPD.  Will place you on prednisone for treatment of COPD exacerbation.  Recommend follow-up with patient setting for reassessment.  Please do not hesitate to return to emergency department for worrisome signs and symptoms we discussed become apparent.

## 2023-06-29 NOTE — Progress Notes (Signed)
Discharge Progress Report  Patient Details  Name: Cheryl Haley MRN: [id number] Date of Birth: July 10, 1956 Referring Provider:   Doristine Devoid Pulmonary Rehab Walk Test from 03/27/2023 in [facility]  Referring Provider Icard        Number of Visits: 23  Reason for Discharge:  Patient reached a stable level of exercise. Patient independent in their exercise. Patient has met program and personal goals.  Smoking History:  Social History   Tobacco Use  Smoking Status Former   Current packs/day: 0.00   Average packs/day: 2.0 packs/day for 40.0 years (80.0 ttl pk-yrs)   Types: Cigarettes   Start date: 03/16/1977   Quit date: 03/16/2017   Years since quitting: 6.2  Smokeless Tobacco Never  Tobacco Comments        Diagnosis:  Chronic hypoxemic respiratory failure (HCC)  ADL [facility]:  Pulmonary Assessment Scores     Row Name 03/27/23 1057 06/16/23 1600 06/23/23 1559     ADL [facility]   ADL Phase Entry Exit --   SOB Score total 74 77 --     CAT Score   CAT Score 14 11 --     mMRC Score   mMRC Score 4 -- 4            Initial Exercise Prescription:  Initial Exercise Prescription - 03/27/23 1100       Date of Initial Exercise RX and Referring Provider   Date 03/27/23    Referring Provider Icard    Expected Discharge Date 06/25/23      Oxygen   Oxygen Continuous    Liters 2    Maintain Oxygen Saturation 88% or higher      NuStep   Level 1    SPM 50    Minutes 30    METs 1.5      Prescription Details   Frequency (times per week) 2    Duration Progress to 30 minutes of continuous aerobic without signs/symptoms of physical distress      Intensity   THRR 40-80% of Max Heartrate 61-122    Ratings of Perceived Exertion 11-13    Perceived Dyspnea 0-4      Progression   Progression Continue to progress workloads to maintain intensity without signs/symptoms of physical distress.      Resistance  Training   Training Prescription Yes    Weight red bands    Reps 10-15             Discharge Exercise Prescription (Final Exercise Prescription Changes):  Exercise Prescription Changes - 06/16/23 0900       Response to Exercise   Blood Pressure (Admit) 112/64    Blood Pressure (Exercise) 132/64    Blood Pressure (Exit) 112/60    Heart Rate (Admit) 84 bpm    Heart Rate (Exercise) 92 bpm    Heart Rate (Exit) 95 bpm    Oxygen Saturation (Admit) 99 %    Oxygen Saturation (Exercise) 96 %    Oxygen Saturation (Exit) 98 %    Rating of Perceived Exertion (Exercise) 13    Perceived Dyspnea (Exercise) 3    Duration Continue with 30 min of aerobic exercise without signs/symptoms of physical distress.    Intensity THRR unchanged      Progression   Progression Continue to progress workloads to maintain intensity without signs/symptoms of physical distress.      Resistance Training   Training Prescription Yes    Weight blue  bands    Reps 10-15    Time 10 Minutes      Oxygen   Oxygen Continuous    Liters 2      NuStep   Level 3    SPM 71    Minutes 15    METs 2.4      Track   Laps 7    Minutes 15    METs 2.08      Oxygen   Maintain Oxygen Saturation 88% or higher             Functional Capacity:  6 Minute Walk     Row Name 03/27/23 1130 06/23/23 0940       6 Minute Walk   Phase Initial Discharge    Distance 555 feet 1086 feet    Distance % Change -- 95.68 %    Distance Feet Change -- 531 ft    Walk Time 6 minutes 6 minutes    # of Rest Breaks 0 0    MPH 1.05 3.18    METS 2.21 3.18    RPE 13 13    Perceived Dyspnea  2 2    VO2 Peak 7.74 11.14    Symptoms No No    Resting HR 94 bpm 86 bpm    Resting BP 132/60 124/58    Resting Oxygen Saturation  96 % 96 %    Exercise Oxygen Saturation  during 6 min walk 93 % 89 %    Max Ex. HR 105 bpm 111 bpm    Max Ex. BP 132/60 134/58    2 Minute Post BP 124/60 126/56      Interval HR   1 Minute HR 102 101     2 Minute HR 103 105    3 Minute HR 102 110    4 Minute HR 103 110    5 Minute HR 104 111    6 Minute HR 105 108    2 Minute Post HR 97 82    Interval Heart Rate? Yes Yes      Interval Oxygen   Interval Oxygen? Yes Yes  Patient used POC    Baseline Oxygen Saturation % 96 % 96 %    1 Minute Oxygen Saturation % 98 % 99 %    1 Minute Liters of Oxygen 2 L 3 L    2 Minute Oxygen Saturation % 98 % 98 %    2 Minute Liters of Oxygen 2 L 3 L    3 Minute Oxygen Saturation % 97 % 94 %    3 Minute Liters of Oxygen 2 L 3 L    4 Minute Oxygen Saturation % 95 % 92 %    4 Minute Liters of Oxygen 2 L 3 L    5 Minute Oxygen Saturation % 95 % 90 %    5 Minute Liters of Oxygen 2 L 3 L    6 Minute Oxygen Saturation % 95 % 89 %    6 Minute Liters of Oxygen 2 L 3 L    2 Minute Post Oxygen Saturation % 93 % 97 %    2 Minute Post Liters of Oxygen 2 L 3 L             Psychological, QOL, Others - Outcomes: PHQ 2/9:    06/16/2023    3:58 PM 03/27/2023   11:02 AM  Depression screen PHQ 2/9  Decreased Interest 1 0  Down, Depressed, Hopeless 0 0  PHQ - 2 Score 1 0  Altered sleeping 0 0  Tired, decreased energy 1 1  Change in appetite 0 0  Feeling bad or failure about yourself  0 0  Trouble concentrating 0 0  Moving slowly or fidgety/restless 0 0  Suicidal thoughts 0 0  PHQ-9 Score 2 1  Difficult doing work/chores Somewhat difficult Somewhat difficult    Quality of Life:   Personal Goals: Goals established at orientation with interventions provided to work toward goal.  Personal Goals and Risk Factors at Admission - 03/27/23 1048       Core Components/Risk Factors/Patient Goals on Admission    Weight Management Weight Gain;Yes    Intervention Weight Management: Develop a combined nutrition and exercise program designed to reach desired caloric intake, while maintaining appropriate intake of nutrient and fiber, sodium and fats, and appropriate energy expenditure required for the weight  goal.;Weight Management: Provide education and appropriate resources to help participant work on and attain dietary goals.;Weight Management/Obesity: Establish reasonable short term and long term weight goals.;Obesity: Provide education and appropriate resources to help participant work on and attain dietary goals.    Admit Weight 105 lb 2.6 oz (47.7 kg)    Expected Outcomes Short Term: Continue to assess and modify interventions until short term weight is achieved;Long Term: Adherence to nutrition and physical activity/exercise program aimed toward attainment of established weight goal;Weight Gain: Understanding of general recommendations for a high calorie, high protein meal plan that promotes weight gain by distributing calorie intake throughout the day with the consumption for 4-5 meals, snacks, and/or supplements    Improve shortness of breath with ADL's Yes    Intervention Provide education, individualized exercise plan and daily activity instruction to help decrease symptoms of SOB with activities of daily living.    Expected Outcomes Short Term: Improve cardiorespiratory fitness to achieve a reduction of symptoms when performing ADLs;Long Term: Be able to perform more ADLs without symptoms or delay the onset of symptoms    Increase knowledge of respiratory medications and ability to use respiratory devices properly  Yes    Intervention Provide education and demonstration as needed of appropriate use of medications, inhalers, and oxygen therapy.    Expected Outcomes Short Term: Achieves understanding of medications use. Understands that oxygen is a medication prescribed by physician. Demonstrates appropriate use of inhaler and oxygen therapy.;Long Term: Maintain appropriate use of medications, inhalers, and oxygen therapy.    Hypertension Yes    Intervention Provide education on lifestyle modifcations including regular physical activity/exercise, weight management, moderate sodium restriction and  increased consumption of fresh fruit, vegetables, and low fat dairy, alcohol moderation, and smoking cessation.;Monitor prescription use compliance.    Expected Outcomes Short Term: Continued assessment and intervention until BP is < 140/25mm HG in hypertensive participants. < 130/61mm HG in hypertensive participants with diabetes, heart failure or chronic kidney disease.;Long Term: Maintenance of blood pressure at goal levels.              Personal Goals Discharge:  Goals and Risk Factor Review     Row Name 04/03/23 1129 04/29/23 1504 05/25/23 0905 06/29/23 0920       Core Components/Risk Factors/Patient Goals Review   Personal Goals Review Improve shortness of breath with ADL's;Develop more efficient breathing techniques such as purse lipped breathing and diaphragmatic breathing and practicing self-pacing with activity.;Hypertension;Increase knowledge of respiratory medications and ability to use respiratory devices properly.  Weight Gain Improve shortness of breath with ADL's;Develop more efficient breathing techniques such as  purse lipped breathing and diaphragmatic breathing and practicing self-pacing with activity.;Hypertension;Increase knowledge of respiratory medications and ability to use respiratory devices properly.;Weight Management/Obesity  Weight Gain Improve shortness of breath with ADL's;Weight Management/Obesity  Weight Gain Improve shortness of breath with ADL's;Weight Management/Obesity  Weight Gain    Review Unable to determine progress, Cheryl Haley has Haley 1 class so far. Goal in progress for weight gain. Cheryl Haley has not had success yet in gaining weight but is still working with our dietician to increase meal frequency and add high calorie protein shakes. Goal in progress on developing more efficient breathing techniques such as purse lipped breathing and diaphragmatic breathing; and practicing self-pacing with activity. We have been working & teaching Cheryl Haley on how to perform PLB  and pacing herself while walking the track. Goal in progress on improving her shortness of breath with ADLs. Cheryl Haley has maintained her oxygen saturation >88% while exercising on 2L [place]. Goal met for hypertension control. For the past 6 sessions Cheryl Haley blood pressures have been WNL. She is compliant in taking her Toprol-XL for BP control. Goal also met for increasing her knowledge of respiratory medications and ability to use respiratory devices properly. She has correctly demonstrated and voiced when to use her medications with our respiratory therapist. She will continue to benefit from PR for nutrition, education, exercise, and lifestyle modification. Goal in progress for weight gain. Cheryl Haley has not had success yet in gaining weight but is still working with our dietician to increase meal frequency and adding high calorie protein shakes. She is up ~1# since starting. Goal met on developing more efficient breathing techniques such as purse lipped breathing and diaphragmatic breathing; and practicing self-pacing with activity. Cheryl Haley can initiate PLB and can self pace herself while walking the track. Goal in progress on improving her shortness of breath with ADLs. Cheryl Haley has maintained her oxygen saturation >88% while exercising on 2L [place]. Cheryl Haley will continue to benefit from PR for nutrition, education, exercise, and lifestyle modification. Cheryl Haley from [place] on 06/25/23. She Haley 23 sessions. Core Components/Risk Factors/Patient Goals Re-evaluation as follows: Goal met for weight gain. Cheryl Haley has gained ~1.5# since starting the program. She worked with our dietician to increase meal frequency and added high calorie protein shakes. Goal met on improving her shortness of breath with ADLs. Cheryl Haley has maintained her oxygen saturation >88% while exercising on 2L [place]. Cheryl Haley has made great improvement increasing her workload, METs, and increased her by 531 steps. Cheryl Haley grip strength improved, MPH, and VO2 Peak. Overall  improvement of 95.68%. We are proud of the progress Cheryl Haley has made in the program!    Expected Outcomes See admission goals For Cheryl Haley to gain weight, improve her shortness of breath with ADLs, to develop more efficient breathing techniques, and increase her knowledge of respiratory medications. For Cheryl Haley to gain weight and improve her shortness of breath with ADLs. For Cheryl Haley to continue to gain weight and improve her shortness of breath with ADLs post graduation.             Exercise Goals and Review:  Exercise Goals     Row Name 03/27/23 1050 04/02/23 0917 04/28/23 1551         Exercise Goals   Increase Physical Activity Yes Yes Yes     Intervention Provide advice, education, support and counseling about physical activity/exercise needs.;Develop an individualized exercise prescription for aerobic and resistive training based on initial evaluation findings, risk stratification, comorbidities and participant's personal goals. Provide advice, education, support and  counseling about physical activity/exercise needs.;Develop an individualized exercise prescription for aerobic and resistive training based on initial evaluation findings, risk stratification, comorbidities and participant's personal goals. Provide advice, education, support and counseling about physical activity/exercise needs.;Develop an individualized exercise prescription for aerobic and resistive training based on initial evaluation findings, risk stratification, comorbidities and participant's personal goals.     Expected Outcomes Short Term: Attend rehab on a regular basis to increase amount of physical activity.;Long Term: Exercising regularly at least 3-5 days a week.;Long Term: Add in home exercise to make exercise part of routine and to increase amount of physical activity. Short Term: Attend rehab on a regular basis to increase amount of physical activity.;Long Term: Exercising regularly at least 3-5 days a week.;Long Term: Add in  home exercise to make exercise part of routine and to increase amount of physical activity. Short Term: Attend rehab on a regular basis to increase amount of physical activity.;Long Term: Exercising regularly at least 3-5 days a week.;Long Term: Add in home exercise to make exercise part of routine and to increase amount of physical activity.     Increase Strength and Stamina Yes Yes Yes     Intervention Provide advice, education, support and counseling about physical activity/exercise needs.;Develop an individualized exercise prescription for aerobic and resistive training based on initial evaluation findings, risk stratification, comorbidities and participant's personal goals. Provide advice, education, support and counseling about physical activity/exercise needs.;Develop an individualized exercise prescription for aerobic and resistive training based on initial evaluation findings, risk stratification, comorbidities and participant's personal goals. Provide advice, education, support and counseling about physical activity/exercise needs.;Develop an individualized exercise prescription for aerobic and resistive training based on initial evaluation findings, risk stratification, comorbidities and participant's personal goals.     Expected Outcomes Short Term: Increase workloads from initial exercise prescription for resistance, speed, and METs.;Short Term: Perform resistance training exercises routinely during rehab and add in resistance training at home;Long Term: Improve cardiorespiratory fitness, muscular endurance and strength as measured by increased METs and functional capacity ( ) Short Term: Increase workloads from initial exercise prescription for resistance, speed, and METs.;Short Term: Perform resistance training exercises routinely during rehab and add in resistance training at home;Long Term: Improve cardiorespiratory fitness, muscular endurance and strength as measured by increased METs and  functional capacity ( ) Short Term: Increase workloads from initial exercise prescription for resistance, speed, and METs.;Short Term: Perform resistance training exercises routinely during rehab and add in resistance training at home;Long Term: Improve cardiorespiratory fitness, muscular endurance and strength as measured by increased METs and functional capacity ( )     Able to understand and use rate of perceived exertion (RPE) scale Yes Yes Yes     Intervention Provide education and explanation on how to use RPE scale Provide education and explanation on how to use RPE scale Provide education and explanation on how to use RPE scale     Expected Outcomes Short Term: Able to use RPE daily in rehab to express subjective intensity level;Long Term:  Able to use RPE to guide intensity level when exercising independently Short Term: Able to use RPE daily in rehab to express subjective intensity level;Long Term:  Able to use RPE to guide intensity level when exercising independently Short Term: Able to use RPE daily in rehab to express subjective intensity level;Long Term:  Able to use RPE to guide intensity level when exercising independently     Able to understand and use Dyspnea scale Yes Yes Yes     Intervention Provide education and  explanation on how to use Dyspnea scale Provide education and explanation on how to use Dyspnea scale Provide education and explanation on how to use Dyspnea scale     Expected Outcomes Short Term: Able to use Dyspnea scale daily in rehab to express subjective sense of shortness of breath during exertion;Long Term: Able to use Dyspnea scale to guide intensity level when exercising independently Short Term: Able to use Dyspnea scale daily in rehab to express subjective sense of shortness of breath during exertion;Long Term: Able to use Dyspnea scale to guide intensity level when exercising independently Short Term: Able to use Dyspnea scale daily in rehab to express  subjective sense of shortness of breath during exertion;Long Term: Able to use Dyspnea scale to guide intensity level when exercising independently     Knowledge and understanding of Target Heart Rate Range (THRR) Yes Yes Yes     Intervention Provide education and explanation of THRR including how the numbers were predicted and where they are located for reference Provide education and explanation of THRR including how the numbers were predicted and where they are located for reference Provide education and explanation of THRR including how the numbers were predicted and where they are located for reference     Expected Outcomes Short Term: Able to state/look up THRR;Long Term: Able to use THRR to govern intensity when exercising independently;Short Term: Able to use daily as guideline for intensity in rehab Short Term: Able to state/look up THRR;Long Term: Able to use THRR to govern intensity when exercising independently;Short Term: Able to use daily as guideline for intensity in rehab Short Term: Able to state/look up THRR;Long Term: Able to use THRR to govern intensity when exercising independently;Short Term: Able to use daily as guideline for intensity in rehab     Understanding of Exercise Prescription Yes Yes Yes     Intervention Provide education, explanation, and written materials on patient's individual exercise prescription Provide education, explanation, and written materials on patient's individual exercise prescription Provide education, explanation, and written materials on patient's individual exercise prescription     Expected Outcomes Short Term: Able to explain program exercise prescription;Long Term: Able to explain home exercise prescription to exercise independently Short Term: Able to explain program exercise prescription;Long Term: Able to explain home exercise prescription to exercise independently Short Term: Able to explain program exercise prescription;Long Term: Able to explain  home exercise prescription to exercise independently              Exercise Goals Re-Evaluation:  Exercise Goals Re-Evaluation     Row Name 04/02/23 0917 04/28/23 1551 05/26/23 0750 06/29/23 0720       Exercise Goal Re-Evaluation   Exercise Goals Review Increase Physical Activity;Able to understand and use Dyspnea scale;Understanding of Exercise Prescription;Increase Strength and Stamina;Knowledge and understanding of Target Heart Rate Range (THRR);Able to understand and use rate of perceived exertion (RPE) scale Increase Physical Activity;Able to understand and use Dyspnea scale;Understanding of Exercise Prescription;Increase Strength and Stamina;Knowledge and understanding of Target Heart Rate Range (THRR);Able to understand and use rate of perceived exertion (RPE) scale Increase Physical Activity;Able to understand and use Dyspnea scale;Understanding of Exercise Prescription;Increase Strength and Stamina;Knowledge and understanding of Target Heart Rate Range (THRR);Able to understand and use rate of perceived exertion (RPE) scale Increase Physical Activity;Able to understand and use Dyspnea scale;Understanding of Exercise Prescription;Increase Strength and Stamina;Knowledge and understanding of Target Heart Rate Range (THRR);Able to understand and use rate of perceived exertion (RPE) scale    Comments Pt will  begin exercise today, 8/1. Will progress as tolerated. Jonice has Haley 6 exercise sessions. She exercises for 15 min on the [place]ust[place] and track. Cheryl Haley averages 1.8 METs at level 2 on the Nustep and 2.08 METs on the track. Cheryl Haley have increased her level on the Nustep as METs have increased. Cheryl Haley has also progressed to walking the track for 15 min. She feels that her functional capacity is improving since starting PR. Will continue to monitor and progreess as able. Cheryl Haley has Haley 13 exercise sessions. She exercises for 15 min on the Nustep and track. Cheryl Haley averages 2.1 METs at level  3 on the Nustep and 2.23 METs on the track. She performs the warmup and cooldown standing without limitations. Cheryl Haley have increased her level on the Nustep as METs have increased. Cheryl Haley has increased her track laps. Will continue to monitor and progress as able. Cheryl Haley 23 exercise sessions. She exercised for 15 min on the Nustep and track. Peak METs were 2.4 on the Nustep and 2.38 on the track. She increased her workload several times throughout the program. Cheryl Haley feels that she has benefitted from rehab.    Expected Outcomes Through exercise at rehab and home, the patient will decrease shortness of breath with daily activities and feel confident in carrying out an exercise regimen at home Through exercise at rehab and home, the patient will decrease shortness of breath with daily activities and feel confident in carrying out an exercise regimen at home Through exercise at rehab and home, the patient will decrease shortness of breath with daily activities and feel confident in carrying out an exercise regimen at home Through exercise at rehab and home, the patient will decrease shortness of breath with daily activities and feel confident in carrying out an exercise regimen at home             Nutrition & Weight - Outcomes:  Pre Biometrics - 03/27/23 1134       Pre Biometrics   Grip Strength 21 kg              Nutrition:  Nutrition Therapy & Goals - 06/02/23 1307       Nutrition Therapy   Diet Heart Healthy Diet      Personal Nutrition Goals   Nutrition Goal Patient to identify strategies for weight maintenance/weight gain of 0.5-2.0# per week.   goal in progress.   Comments Goals in action. Cheryl Haley reports history of weight loss with protein calorie malnutirtion with weight as low as 85# in July of 2023. She is hopeful to gain back to 115-120#. She prefers to eat "whatever" she wants but is receptive to increasing eating frequency to every 2-3 hours and implemented  Boost/Ensure 1x/day. She is up 1.1# since starting with our program. Cheryl Haley will continue to benefit from participation in pulmonary rehab for nutrition, exercise, and lifestyle modifications.      Intervention Plan   Intervention Prescribe, educate and counsel regarding individualized specific dietary modifications aiming towards targeted core components such as weight, hypertension, lipid management, diabetes, heart failure and other comorbidities.;Nutrition handout(s) given to patient.    Expected Outcomes Short Term Goal: Understand basic principles of dietary content, such as calories, fat, sodium, cholesterol and nutrients.;Long Term Goal: Adherence to prescribed nutrition plan.             Nutrition Discharge:  Nutrition Assessments - 06/18/23 1017       Rate Your Plate Scores   Post Score 57  Education Questionnaire Score:  Knowledge Questionnaire Score - 06/16/23 1600       Knowledge Questionnaire Score   Pre Score 13/18    Post Score 16/18            Cheryl Haley PR on 06/25/23 completing 23 sessions. At re-evaluation Cheryl Haley continues to deny any psychosocial barriers or concerns. PHQ-9 score 2. Denied any needs at time of discharge.   Core Components/Risk Factors/Patient Goals Re-evaluation as follows: Goal met for weight gain. Cheryl Haley has gained ~1.5# since starting the program. She worked with our dietician to increase meal frequency and added high calorie protein shakes. Goal met on improving her shortness of breath with ADLs. Cheryl Haley has maintained her oxygen saturation >88% while exercising on 2L [place]. Cheryl Haley has made great improvement increasing her workload, METs, and increased her by 531 steps. Cheryl Haley grip strength improved, MPH, and VO2 Peak. Overall improvement of 95.68%. We are proud of the progress Cheryl Haley has made in the program

## 2023-07-20 ENCOUNTER — Other Ambulatory Visit: Payer: Self-pay | Admitting: Cardiology

## 2023-07-20 DIAGNOSIS — Z8673 Personal history of transient ischemic attack (TIA), and cerebral infarction without residual deficits: Secondary | ICD-10-CM

## 2023-07-20 DIAGNOSIS — E7800 Pure hypercholesterolemia, unspecified: Secondary | ICD-10-CM

## 2023-07-20 DIAGNOSIS — R931 Abnormal findings on diagnostic imaging of heart and coronary circulation: Secondary | ICD-10-CM

## 2023-08-17 ENCOUNTER — Other Ambulatory Visit: Payer: Self-pay

## 2023-08-17 ENCOUNTER — Telehealth: Payer: Self-pay | Admitting: Pulmonary Disease

## 2023-08-17 MED ORDER — ROSUVASTATIN CALCIUM 40 MG PO TABS: 40.0000 mg | ORAL_TABLET | Freq: Every day | ORAL | 0 refills | Status: DC

## 2023-08-17 NOTE — Telephone Encounter (Signed)
Patient received a letter in the mail from insurance that her Markus Daft will no longer be covered starting in 2025. If she needs to only take the [facility], a form can be submitted. Trelegy Wynona Dove  will be coved by her insurance.

## 2023-08-17 NOTE — Telephone Encounter (Signed)
New Message:     Patient says she have changed pharmacy. Her new pharmacy is [facility] [id number], [place]

## 2023-08-19 NOTE — Telephone Encounter (Signed)
Msg left for patient to let us know which pharmacy she would like inhaler sent to.

## 2023-08-20 NOTE — Telephone Encounter (Signed)
[facility] [address], [place]

## 2023-08-21 ENCOUNTER — Telehealth: Payer: Self-pay | Admitting: Cardiology

## 2023-08-21 ENCOUNTER — Other Ambulatory Visit (HOSPITAL_COMMUNITY): Payer: Self-pay

## 2023-08-21 ENCOUNTER — Other Ambulatory Visit: Payer: Self-pay

## 2023-08-21 DIAGNOSIS — R931 Abnormal findings on diagnostic imaging of heart and coronary circulation: Secondary | ICD-10-CM

## 2023-08-21 DIAGNOSIS — E7800 Pure hypercholesterolemia, unspecified: Secondary | ICD-10-CM

## 2023-08-21 DIAGNOSIS — Z8673 Personal history of transient ischemic attack (TIA), and cerebral infarction without residual deficits: Secondary | ICD-10-CM

## 2023-08-21 MED ORDER — TRELEGY ELLIPTA 100-62.5-25 MCG/ACT IN AEPB: 1.0000 | INHALATION_SPRAY | Freq: Every day | RESPIRATORY_TRACT | 2 refills | Status: DC

## 2023-08-21 MED ORDER — REPATHA SURECLICK 140 MG/ML ~~LOC~~ SOAJ
140.0000 mg | SUBCUTANEOUS | 3 refills | Status: DC
  Filled 2023-08-21: qty 6, 84d supply, fill #0

## 2023-08-21 NOTE — Telephone Encounter (Signed)
Cheryl Igo, DO to Me     08/21/23 11:42 AM I am good with switching to trelegy BLI   Rx was sent  Pt aware

## 2023-08-21 NOTE — Telephone Encounter (Signed)
*  STAT* If patient is at the pharmacy, call can be transferred to refill team.   1. Which medications need to be refilled? (please list name of each medication and dose if known) REPATHA SURECLICK 140 MG/ML SOAJ    2. Would you like to learn more about the convenience, safety, & potential cost savings by using the [facility]?    3. Are you open to using the [facility] (Type [facility]. ).   4. Which pharmacy/location (including street and city if local pharmacy) is medication to be sent to? [id number] [place], [place] - [address]     5. Do they need a 30 day or 90 day supply? 30

## 2023-08-21 NOTE — Telephone Encounter (Signed)
Dr Tonia Brooms are you ok with pt taking trelegy or would you like to try for auth of the breztri?

## 2023-08-24 ENCOUNTER — Other Ambulatory Visit (HOSPITAL_COMMUNITY): Payer: Self-pay

## 2023-08-24 MED ORDER — REPATHA SURECLICK 140 MG/ML ~~LOC~~ SOAJ: 140.0000 mg | SUBCUTANEOUS | 11 refills | Status: DC

## 2023-08-24 NOTE — Telephone Encounter (Signed)
Pt trying to get their medication filled per phone note from 12/20. Please advise

## 2023-08-27 ENCOUNTER — Other Ambulatory Visit (HOSPITAL_COMMUNITY): Payer: Self-pay

## 2023-08-27 ENCOUNTER — Telehealth: Payer: Self-pay | Admitting: Pulmonary Disease

## 2023-08-27 MED ORDER — PREDNISONE 10 MG PO TABS: ORAL_TABLET | ORAL | 0 refills | Status: DC

## 2023-08-27 NOTE — Telephone Encounter (Signed)
Lm x1 for patient.  

## 2023-08-27 NOTE — Telephone Encounter (Signed)
Pt denies cough, chest congestion and fever. States she is just having trouble with her breathing and would just like prednisone. Dr.Icard please advise

## 2023-08-27 NOTE — Telephone Encounter (Signed)
Josephine Igo, DO to [place], CMA  Lbpu Triage [facility]     08/27/23  2:12 PM  Please send in prednisone taper 40mg X4 days and decrease by 10mg  every 4 days  BLI   I called and spoke with the pt  She is aware of response from Dr Tonia Brooms  I have sent rx for pred to her preferred pharm  Nothing further needed

## 2023-08-27 NOTE — Telephone Encounter (Signed)
Patient is having Shortness of Breath and would like prednisone to be called in.  [facility]

## 2023-09-07 ENCOUNTER — Telehealth: Payer: Self-pay | Admitting: Adult Health

## 2023-09-07 NOTE — Telephone Encounter (Signed)
 Patient needs new prescription for Roflumilast which is generic for Daliresp [facility]

## 2023-09-08 ENCOUNTER — Ambulatory Visit: Payer: Medicare Other | Admitting: Cardiology

## 2023-09-08 MED ORDER — ROFLUMILAST 500 MCG PO TABS: 500.0000 ug | ORAL_TABLET | Freq: Every day | ORAL | 5 refills | Status: DC

## 2023-09-08 NOTE — Telephone Encounter (Signed)
 Refill sent Daliresp.

## 2023-10-05 ENCOUNTER — Inpatient Hospital Stay: Payer: Medicare Other

## 2023-10-05 ENCOUNTER — Ambulatory Visit: Payer: Medicare Other | Admitting: Medical Oncology

## 2023-10-06 ENCOUNTER — Ambulatory Visit: Payer: Medicare Other | Admitting: Adult Health

## 2023-10-11 ENCOUNTER — Other Ambulatory Visit: Payer: Self-pay | Admitting: Cardiology

## 2023-10-11 ENCOUNTER — Other Ambulatory Visit: Payer: Self-pay | Admitting: Pulmonary Disease

## 2023-10-12 ENCOUNTER — Inpatient Hospital Stay: Payer: Medicare Other | Attending: Hematology & Oncology

## 2023-10-12 ENCOUNTER — Encounter: Payer: Self-pay | Admitting: Medical Oncology

## 2023-10-12 ENCOUNTER — Inpatient Hospital Stay (HOSPITAL_BASED_OUTPATIENT_CLINIC_OR_DEPARTMENT_OTHER): Payer: Medicare Other | Admitting: Medical Oncology

## 2023-10-12 VITALS — BP 114/55 | HR 104 | Temp 98.2°F | Resp 19 | Ht 64.0 in | Wt 102.0 lb

## 2023-10-12 DIAGNOSIS — D509 Iron deficiency anemia, unspecified: Secondary | ICD-10-CM | POA: Diagnosis not present

## 2023-10-12 DIAGNOSIS — D649 Anemia, unspecified: Secondary | ICD-10-CM

## 2023-10-12 DIAGNOSIS — D473 Essential (hemorrhagic) thrombocythemia: Secondary | ICD-10-CM | POA: Diagnosis present

## 2023-10-12 DIAGNOSIS — Z7982 Long term (current) use of aspirin: Secondary | ICD-10-CM | POA: Diagnosis not present

## 2023-10-12 LAB — CBC WITH DIFFERENTIAL (CANCER CENTER ONLY)
Abs Immature Granulocytes: 0.04 10*3/uL (ref 0.00–0.07)
Basophils Absolute: 0 10*3/uL (ref 0.0–0.1)
Basophils Relative: 0 %
Eosinophils Absolute: 0 10*3/uL (ref 0.0–0.5)
Eosinophils Relative: 0 %
HCT: 41.1 % (ref 36.0–46.0)
Hemoglobin: 13.2 g/dL (ref 12.0–15.0)
Immature Granulocytes: 1 %
Lymphocytes Relative: 13 %
Lymphs Abs: 0.8 10*3/uL (ref 0.7–4.0)
MCH: 31.2 pg (ref 26.0–34.0)
MCHC: 32.1 g/dL (ref 30.0–36.0)
MCV: 97.2 fL (ref 80.0–100.0)
Monocytes Absolute: 0.3 10*3/uL (ref 0.1–1.0)
Monocytes Relative: 5 %
Neutro Abs: 5.1 10*3/uL (ref 1.7–7.7)
Neutrophils Relative %: 81 %
Platelet Count: 301 10*3/uL (ref 150–400)
RBC: 4.23 MIL/uL (ref 3.87–5.11)
RDW: 14.5 % (ref 11.5–15.5)
WBC Count: 6.3 10*3/uL (ref 4.0–10.5)
nRBC: 0 % (ref 0.0–0.2)

## 2023-10-12 LAB — CMP (CANCER CENTER ONLY)
ALT: 10 U/L (ref 0–44)
AST: 20 U/L (ref 15–41)
Albumin: 4.1 g/dL (ref 3.5–5.0)
Alkaline Phosphatase: 49 U/L (ref 38–126)
Anion gap: 7 (ref 5–15)
BUN: 16 mg/dL (ref 8–23)
CO2: 32 mmol/L (ref 22–32)
Calcium: 9.9 mg/dL (ref 8.9–10.3)
Chloride: 102 mmol/L (ref 98–111)
Creatinine: 1 mg/dL (ref 0.44–1.00)
GFR, Estimated: >60 mL/min (ref >60)
Glucose, Bld: 154 mg/dL — ABNORMAL HIGH (ref 70–99)
Potassium: 4.9 mmol/L (ref 3.5–5.1)
Sodium: 141 mmol/L (ref 135–145)
Total Bilirubin: 0.4 mg/dL (ref 0.0–1.2)
Total Protein: 6.8 g/dL (ref 6.5–8.1)

## 2023-10-12 LAB — FERRITIN: Ferritin: 27 ng/mL (ref 11–307)

## 2023-10-12 LAB — LACTATE DEHYDROGENASE: LDH: 188 U/L (ref 98–192)

## 2023-10-12 MED ORDER — HYDROXYUREA 500 MG PO CAPS: ORAL_CAPSULE | ORAL | 1 refills | Status: DC

## 2023-10-12 NOTE — Progress Notes (Signed)
Hematology and Oncology Follow Up Visit  Cheryl Haley [id number] 03-19-1956 68 y.o. 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 is here today for follow-up. She is here with her sister who helps look after her.   They report that she is doing well.   There has been no bleeding to her knowledge: denies epistaxis, gingivitis, hemoptysis, hematemesis, hematuria, melena, excessive bruising, blood donation.   She denies fatigue or worsening of SOB. She uses 3L supplemental O2 when up and about and then drops to 2L when sedentary at home.  No fever, chills, n/v, cough, rash, dizziness, chest pain, palpitations, abdominal pain or changes in bowel or bladder habits.  No swelling, tenderness, numbness or tingling in her extremities at this time.  No falls or syncope reported. She uses her Rolator for added support.  Appetite and hydration have been good.   Wt Readings from Last 3 Encounters:  10/12/23 102 lb (46.3 kg)  06/27/23 108 lb 0.4 oz (49 kg)  06/16/23 107 lb 9.4 oz (48.8 kg)   ECOG Performance Status: 1 - Symptomatic but completely ambulatory  Medications:  Allergies as of 10/12/2023       Reactions   Bisoprolol Fumarate Other (See Comments)   Other reaction(s): heart issues        Medication List        Accurate as of October 12, 2023  3:04 PM. If you have any questions, ask your nurse or doctor.          STOP taking these medications    Breztri Aerosphere 160-9-4.8 MCG/ACT Aero Generic drug: Budeson-Glycopyrrol-Formoterol Stopped by: Rushie Chestnut   rosuvastatin 40 MG tablet Commonly known as: CRESTOR Stopped by: Rushie Chestnut       TAKE these medications    albuterol 108 (90 Base) MCG/ACT inhaler Commonly known as: VENTOLIN HFA Inhale 2 puffs into the lungs every  6 (six) hours as needed for wheezing or shortness of breath.   albuterol (2.5 MG/3ML) 0.083% nebulizer solution Commonly known as: PROVENTIL Take 3 mLs (2.5 mg total) by nebulization every 6 (six) hours as needed for wheezing or shortness of breath.   alendronate 70 MG tablet Commonly known as: FOSAMAX Take 70 mg by mouth every Saturday.   amitriptyline 50 MG tablet Commonly known as: ELAVIL Take 50 mg by mouth at bedtime.   aspirin EC 81 MG tablet Take 81 mg daily by mouth.   Daliresp 500 MCG Tabs tablet Generic drug: roflumilast Take 1 tablet by mouth once daily   roflumilast 500 MCG Tabs tablet Commonly known as: DALIRESP Take 1 tablet (500 mcg total) by mouth daily.   hydroxyurea 500 MG capsule Commonly known as: HYDREA TAKE ONE CAPSULE BY MOUTH EVERY OTHER DAY WITH FOOD TO MINIMIZE GI SIDE EFFECTS   levothyroxine 100 MCG tablet Commonly known as: SYNTHROID Take 100 mcg by mouth every morning. What changed: Another medication with the same name was removed. Continue taking this medication, and follow the directions you see here. Changed by: Rushie Chestnut   metoprolol succinate 25 MG 24 hr tablet Commonly known as: TOPROL-XL Take 1 tablet (25 mg total) by mouth daily.   multivitamin with minerals Tabs tablet Take 1 tablet by mouth daily.   OXYGEN Inhale 2 L into the lungs continuous.   predniSONE 5 MG  tablet Commonly known as: DELTASONE TAKE 1 TABLET BY MOUTH EVERY DAY WITH BREAKFAST What changed: Another medication with the same name was removed. Continue taking this medication, and follow the directions you see here. Changed by: Rushie Chestnut   Repatha SureClick 140 MG/ML Soaj Generic drug: Evolocumab Inject 140 mg into the skin every 14 (fourteen) days.   Repatha SureClick 140 MG/ML Soaj Generic drug: Evolocumab Inject 140 mg into the skin every 14 (fourteen) days.   Trelegy Ellipta 100-62.5-25 MCG/ACT Aepb Generic drug:  Fluticasone-Umeclidin-Vilant Inhale 1 puff into the lungs daily.        Allergies:  Allergies  Allergen Reactions   Bisoprolol Fumarate Other (See Comments)    Other reaction(s): heart issues    Past Medical History, Surgical history, Social history, and Family History were reviewed and updated.  Review of Systems: All other 10 point review of systems is negative.   Physical Exam:  height is 5\' 4"  (1.626 m) and weight is 102 lb (46.3 kg). Her oral temperature is 98.2 F (36.8 C). Her blood pressure is 114/55 (abnormal) and her pulse is 104 (abnormal). Her respiration is 19 and oxygen saturation is 94%.   Wt Readings from Last 3 Encounters:  10/12/23 102 lb (46.3 kg)  06/27/23 108 lb 0.4 oz (49 kg)  06/16/23 107 lb 9.4 oz (48.8 kg)    Ocular: Sclerae unicteric, pupils equal, round and reactive to light Ear-nose-throat: Oropharynx clear, dentition fair Lymphatic: No cervical or supraclavicular adenopathy Lungs no rales or rhonchi, good excursion bilaterally Heart regular rate and rhythm, no murmur appreciated Abd soft, nontender, positive bowel sounds MSK no focal spinal tenderness, no joint edema Neuro: non-focal, well-oriented, appropriate affect   Lab Results  Component Value Date   WBC 6.3 10/12/2023   HGB 13.2 10/12/2023   HCT 41.1 10/12/2023   MCV 97.2 10/12/2023   PLT 301 10/12/2023   Lab Results  Component Value Date   FERRITIN 21 06/03/2023   IRON 92 06/03/2023   TIBC 407 06/03/2023   UIBC 315 06/03/2023   IRONPCTSAT 23 06/03/2023   Lab Results  Component Value Date   RBC 4.23 10/12/2023   No results found for: "KPAFRELGTCHN", "LAMBDASER", "KAPLAMBRATIO" No results found for: "IGGSERUM", "IGA", "IGMSERUM" Lab Results  Component Value Date   TOTALPROTELP 6.8 09/23/2018   ALBUMINELP 3.9 09/23/2018   A1GS 0.1 09/23/2018   A2GS 0.9 09/23/2018   BETS 0.8 09/23/2018   GAMS 1.0 09/23/2018   MSPIKE Not Observed 09/23/2018   SPEI Comment 09/23/2018      Chemistry      Component Value Date/Time   NA 141 10/12/2023 1410   K 4.9 10/12/2023 1410   CL 102 10/12/2023 1410   CO2 32 10/12/2023 1410   BUN 16 10/12/2023 1410   CREATININE 1.00 10/12/2023 1410      Component Value Date/Time   CALCIUM 9.9 10/12/2023 1410   ALKPHOS 49 10/12/2023 1410   AST 20 10/12/2023 1410   ALT 10 10/12/2023 1410   BILITOT 0.4 10/12/2023 1410     Encounter Diagnoses  Name Primary?   Essential thrombocythemia (HCC) Yes   Iron deficiency anemia, unspecified iron deficiency anemia type     Impression and Plan: Ms. Laroche is a very pleasant 68 yo female with essential thrombocythemia, JAK2 V617F+; high risk by IPSET. She currently is taking Hydrea 500 mg every other day.   CBC/CMP are stable. Hydrea refilled- continue at current dose. We are spacing out follow up to match  up with her sister who is caregiver.  Iron studies are pending RTC 6 months APP, labs (LDH, iron, ferritin, CMP, CBC w/)  Rushie Chestnut, PA-C 2/10/20253:04 PM

## 2023-10-13 LAB — IRON AND IRON BINDING CAPACITY (CC-WL,HP ONLY)
Iron: 57 ug/dL (ref 28–170)
Saturation Ratios: 15 % (ref 10.4–31.8)
TIBC: 372 ug/dL (ref 250–450)
UIBC: 315 ug/dL (ref 148–442)

## 2023-10-14 ENCOUNTER — Other Ambulatory Visit: Payer: Self-pay

## 2023-10-14 ENCOUNTER — Emergency Department (HOSPITAL_BASED_OUTPATIENT_CLINIC_OR_DEPARTMENT_OTHER)
Admission: EM | Admit: 2023-10-14 | Discharge: 2023-10-14 | Disposition: A | Discharge status: Home / Self Care | Payer: Medicare Other | Source: Home / Self Care | Attending: Emergency Medicine | Admitting: Emergency Medicine

## 2023-10-14 ENCOUNTER — Emergency Department (HOSPITAL_BASED_OUTPATIENT_CLINIC_OR_DEPARTMENT_OTHER): Payer: Medicare Other

## 2023-10-14 ENCOUNTER — Encounter (HOSPITAL_BASED_OUTPATIENT_CLINIC_OR_DEPARTMENT_OTHER): Payer: Self-pay | Admitting: Emergency Medicine

## 2023-10-14 DIAGNOSIS — Z20822 Contact with and (suspected) exposure to covid-19: Secondary | ICD-10-CM | POA: Insufficient documentation

## 2023-10-14 DIAGNOSIS — J449 Chronic obstructive pulmonary disease, unspecified: Secondary | ICD-10-CM | POA: Insufficient documentation

## 2023-10-14 DIAGNOSIS — B974 Respiratory syncytial virus as the cause of diseases classified elsewhere: Secondary | ICD-10-CM | POA: Insufficient documentation

## 2023-10-14 DIAGNOSIS — B338 Other specified viral diseases: Secondary | ICD-10-CM

## 2023-10-14 LAB — RESP PANEL BY RT-PCR (RSV, FLU A&B, COVID)  RVPGX2
Influenza A by PCR: NEGATIVE
Influenza B by PCR: NEGATIVE
Resp Syncytial Virus by PCR: POSITIVE — AB
SARS Coronavirus 2 by RT PCR: NEGATIVE

## 2023-10-14 MED ORDER — BENZONATATE 100 MG PO CAPS: 100.0000 mg | ORAL_CAPSULE | Freq: Three times a day (TID) | ORAL | 0 refills | Status: DC

## 2023-10-14 MED ORDER — PREDNISONE 10 MG PO TABS: 40.0000 mg | ORAL_TABLET | Freq: Every day | ORAL | 0 refills | Status: DC

## 2023-10-14 NOTE — ED Triage Notes (Signed)
Reports cough and chest congestion started yesterday , on Home O2 at 3 L [place] .  Shortness of breath today .  RT in triage .  Hx COPD .

## 2023-10-14 NOTE — ED Provider Notes (Signed)
 [place] [facility]PARTMENT AT [facility] Provider Note   CSN: [id number] Arrival date & time: 10/14/23  1129     History Chief Complaint  Patient presents with   Shortness of Breath    Cheryl Haley is a 68 y.o. female.  Patient with past history significant for COPD presents the emergency department with concerns of cough.  She reports that she began experiencing cough and chest congestion starting yesterday evening.  Patient currently wears 3 L nasal cannula at home for COPD.  Endorsing some slightly worsening cough with no obvious phlegm.  Denies any worsening shortness of breath.   Shortness of Breath      Home Medications Prior to Admission medications   Medication Sig Start Date End Date Taking? Authorizing Provider  benzonatate (TESSALON) 100 MG capsule Take 1 capsule (100 mg total) by mouth every 8 (eight) hours. 10/14/23  Yes Smitty Knudsen, PA-C  predniSONE (DELTASONE) 10 MG tablet Take 4 tablets (40 mg total) by mouth daily for 5 days. 10/14/23 10/19/23 Yes Maryanna Shape A, PA-C  albuterol (PROVENTIL) (2.5 MG/3ML) 0.083% nebulizer solution Take 3 mLs (2.5 mg total) by nebulization every 6 (six) hours as needed for wheezing or shortness of breath. 03/09/23 03/08/24  Parrett, Virgel Bouquet, NP  albuterol (VENTOLIN HFA) 108 (90 Base) MCG/ACT inhaler Inhale 2 puffs into the lungs every 6 (six) hours as needed for wheezing or shortness of breath. 04/14/22   Josephine Igo, DO  alendronate (FOSAMAX) 70 MG tablet Take 70 mg by mouth every Saturday. 06/13/21   [provider]  amitriptyline (ELAVIL) 50 MG tablet Take 50 mg by mouth at bedtime.    [provider]  aspirin EC 81 MG tablet Take 81 mg daily by mouth.     [provider]  DALIRESP 500 MCG TABS tablet Take 1 tablet by mouth once daily 11/10/22   Kalman Shan, MD  Evolocumab (REPATHA SURECLICK) 140 MG/ML SOAJ Inject 140 mg into the skin every 14 (fourteen) days. 08/21/23   Quintella Reichert, MD  Evolocumab (REPATHA SURECLICK) 140 MG/ML SOAJ Inject 140 mg into the skin every 14 (fourteen) days. Patient not taking: Reported on 10/12/2023 08/24/23   Quintella Reichert, MD  Fluticasone-Umeclidin-Vilant (TRELEGY ELLIPTA) 100-62.5-25 MCG/ACT AEPB Inhale 1 puff into the lungs daily. 08/21/23   Icard, Rachel Bo, DO  hydroxyurea (HYDREA) 500 MG capsule TAKE ONE CAPSULE BY MOUTH EVERY OTHER DAY WITH FOOD TO MINIMIZE GI SIDE EFFECTS 10/12/23   Rushie Chestnut, PA-C  levothyroxine (SYNTHROID) 100 MCG tablet Take 100 mcg by mouth every morning. 08/03/23   [provider]  metoprolol succinate (TOPROL-XL) 25 MG 24 hr tablet Take 1 tablet (25 mg total) by mouth daily. 09/15/22   Quintella Reichert, MD  Multiple Vitamin (MULTIVITAMIN WITH MINERALS) TABS tablet Take 1 tablet by mouth daily.    [provider]  OXYGEN Inhale 2 L into the lungs continuous.    [provider]  predniSONE (DELTASONE) 5 MG tablet TAKE 1 TABLET BY MOUTH EVERY DAY WITH BREAKFAST 10/11/23   Parrett, Tammy S, NP  roflumilast (DALIRESP) 500 MCG TABS tablet Take 1 tablet (500 mcg total) by mouth daily. 09/08/23   Parrett, Virgel Bouquet, NP      Allergies    Bisoprolol fumarate    Review of Systems   Review of Systems  Respiratory:  Positive for shortness of breath.   All other systems reviewed and are negative.   Physical Exam Updated  Vital Signs BP 119/65 (BP Location: Right Arm)   Pulse (!) 102   Temp 97.8 F (36.6 C) (Oral)   Resp (!) 25   Wt 47 kg   SpO2 100%   BMI 17.79 kg/m  Physical Exam Vitals and nursing note reviewed.  Constitutional:      General: She is not in acute distress.    Appearance: She is well-developed.  HENT:     Head: Normocephalic and atraumatic.  Eyes:     Conjunctiva/sclera: Conjunctivae normal.  Cardiovascular:     Rate and Rhythm: Normal rate and regular rhythm.     Heart sounds: No murmur heard. Pulmonary:     Effort: Pulmonary effort is normal. No  respiratory distress.     Breath sounds: No decreased breath sounds, wheezing, rhonchi or rales.  Abdominal:     Palpations: Abdomen is soft.     Tenderness: There is no abdominal tenderness.  Musculoskeletal:        General: No swelling.     Cervical back: Neck supple.  Skin:    General: Skin is warm and dry.     Capillary Refill: Capillary refill takes less than 2 seconds.  Neurological:     Mental Status: She is alert.  Psychiatric:        Mood and Affect: Mood normal.     ED Results / Procedures / Treatments   Labs (all labs ordered are listed, but only abnormal results are displayed) Labs Reviewed  RESP PANEL BY RT-PCR (RSV, FLU A&B, COVID)  [id number] - Abnormal; Notable for the following components:      Result Value   Resp Syncytial Virus by PCR POSITIVE (*)    All other components within normal limits    EKG None  Radiology DG Chest 2 View Result Date: 10/14/2023 CLINICAL DATA:  Shortness of breath. EXAM: CHEST - 2 VIEW COMPARISON:  Chest radiograph dated June 27, 2023. FINDINGS: Stable cardiomediastinal silhouette. Aortic atherosclerosis. Emphysematous changes with scarring again noted in the right upper lobe. Stable chronic blunting of the right costophrenic angle. No focal consolidation, pleural effusion, or pneumothorax. Single surgical clip noted in the right upper lobe. No acute osseous abnormality. IMPRESSION: 1. No acute cardiopulmonary findings. 2. Stable emphysematous changes and scarring in the right upper lobe. Electronically Signed   By: Hart Robinsons M.D.   On: 10/14/2023 14:13    Procedures Procedures    Medications Ordered in ED Medications - No data to display  ED Course/ Medical Decision Making/ A&P                                 Medical Decision Making Amount and/or Complexity of Data Reviewed Radiology: ordered.  Risk Prescription drug management.   This patient presents to the ED for concern of cough.  Differential diagnosis  includes COVID-19, pneumonia, bronchitis, RSV   Lab Tests:  I Ordered, and personally interpreted labs.  The pertinent results include: Respiratory panel positive for RSV   Imaging Studies ordered:  I ordered imaging studies including chest x-ray I independently visualized and interpreted imaging which showed No acute cardiopulmonary findings. 2. Stable emphysematous changes and scarring in the right upper lobe. I agree with the radiologist interpretation   Problem List / ED Course:  Patient past history significant for COPD presents emergency department concerns of a cough.  Endorses that cough began last night and has been progressively worsening.  She does report  however that she has some improvement this morning and not acutely feeling short of breath.  She wears 3 L nasal cannula at home.  Currently at same baseline level here in the emergency department.  She denies any significant shortness of breath as far she is noted.  Does endorse some productive coughing but states that there is no discoloration present. On exam, there is no abnormal lung sounds.  Will obtain chest x-ray and viral panel for assessment. Patient is positive for RSV.  Chest x-ray unremarkable. Given combination of reassuring physical exam and identifiable cause of patient's cough with no evidence of hypoxia, will discharge patient home with a prescription for prednisone to attempt to control COPD symptoms over the next 4 days as patient is fighting off RSV.  Denies return precautions such as worsening shortness of breath, chest pain, or confusion.  Patient otherwise stable at this time with reassuring workup and discharged home.   Final Clinical Impression(s) / ED Diagnoses Final diagnoses:  RSV (respiratory syncytial virus infection)    Rx / DC Orders ED Discharge Orders          Ordered    predniSONE (DELTASONE) 10 MG tablet  Daily        10/14/23 1426    benzonatate (TESSALON) 100 MG capsule  Every 8  hours        10/14/23 1426              Smitty Knudsen, PA-C 10/14/23 1843    Royanne Foots, DO 10/19/23 [phone]

## 2023-10-14 NOTE — Discharge Instructions (Signed)
You are seen in the emergency department today for concerns of a cough.  You tested positive for RSV on your viral testing.  Your chest x-ray thankfully is reassuring without any obvious signs of pneumonia.  I sent a prescription for prednisone to your pharmacy to help reduce the risk of complications from your COPD in the setting of an RSV infection.  Also sent a prescription for cough medicine to take.  For any new or worsening symptoms, return the emergency department.  Otherwise please follow-up with your primary care provider.

## 2023-10-16 ENCOUNTER — Other Ambulatory Visit: Payer: Self-pay

## 2023-10-16 ENCOUNTER — Encounter (HOSPITAL_COMMUNITY): Payer: Self-pay | Admitting: Family Medicine

## 2023-10-16 ENCOUNTER — Emergency Department (HOSPITAL_COMMUNITY): Payer: Medicare Other

## 2023-10-16 ENCOUNTER — Inpatient Hospital Stay (HOSPITAL_COMMUNITY)
Admission: EM | Admit: 2023-10-16 | Discharge: 2023-10-20 | DRG: 189 | Disposition: A | Discharge status: Home / Self Care | Payer: Medicare Other | Attending: Internal Medicine | Admitting: Internal Medicine

## 2023-10-16 DIAGNOSIS — Z1152 Encounter for screening for COVID-19: Secondary | ICD-10-CM

## 2023-10-16 DIAGNOSIS — Z7951 Long term (current) use of inhaled steroids: Secondary | ICD-10-CM

## 2023-10-16 DIAGNOSIS — J439 Emphysema, unspecified: Secondary | ICD-10-CM | POA: Diagnosis present

## 2023-10-16 DIAGNOSIS — Z7983 Long term (current) use of bisphosphonates: Secondary | ICD-10-CM | POA: Diagnosis not present

## 2023-10-16 DIAGNOSIS — R636 Underweight: Secondary | ICD-10-CM | POA: Diagnosis present

## 2023-10-16 DIAGNOSIS — E785 Hyperlipidemia, unspecified: Secondary | ICD-10-CM | POA: Diagnosis present

## 2023-10-16 DIAGNOSIS — J449 Chronic obstructive pulmonary disease, unspecified: Secondary | ICD-10-CM | POA: Diagnosis not present

## 2023-10-16 DIAGNOSIS — J440 Chronic obstructive pulmonary disease with acute lower respiratory infection: Secondary | ICD-10-CM | POA: Diagnosis present

## 2023-10-16 DIAGNOSIS — Z888 Allergy status to other drugs, medicaments and biological substances status: Secondary | ICD-10-CM

## 2023-10-16 DIAGNOSIS — E119 Type 2 diabetes mellitus without complications: Secondary | ICD-10-CM | POA: Diagnosis present

## 2023-10-16 DIAGNOSIS — Z801 Family history of malignant neoplasm of trachea, bronchus and lung: Secondary | ICD-10-CM

## 2023-10-16 DIAGNOSIS — Z9981 Dependence on supplemental oxygen: Secondary | ICD-10-CM | POA: Diagnosis not present

## 2023-10-16 DIAGNOSIS — R000 Tachycardia, unspecified: Secondary | ICD-10-CM | POA: Diagnosis not present

## 2023-10-16 DIAGNOSIS — Z79899 Other long term (current) drug therapy: Secondary | ICD-10-CM

## 2023-10-16 DIAGNOSIS — Z79890 Hormone replacement therapy (postmenopausal): Secondary | ICD-10-CM | POA: Diagnosis not present

## 2023-10-16 DIAGNOSIS — Z7982 Long term (current) use of aspirin: Secondary | ICD-10-CM

## 2023-10-16 DIAGNOSIS — Z8673 Personal history of transient ischemic attack (TIA), and cerebral infarction without residual deficits: Secondary | ICD-10-CM

## 2023-10-16 DIAGNOSIS — F419 Anxiety disorder, unspecified: Secondary | ICD-10-CM | POA: Diagnosis present

## 2023-10-16 DIAGNOSIS — D473 Essential (hemorrhagic) thrombocythemia: Secondary | ICD-10-CM | POA: Diagnosis present

## 2023-10-16 DIAGNOSIS — E039 Hypothyroidism, unspecified: Secondary | ICD-10-CM | POA: Diagnosis present

## 2023-10-16 DIAGNOSIS — Z8249 Family history of ischemic heart disease and other diseases of the circulatory system: Secondary | ICD-10-CM

## 2023-10-16 DIAGNOSIS — Z681 Body mass index (BMI) 19 or less, adult: Secondary | ICD-10-CM | POA: Diagnosis not present

## 2023-10-16 DIAGNOSIS — Z825 Family history of asthma and other chronic lower respiratory diseases: Secondary | ICD-10-CM

## 2023-10-16 DIAGNOSIS — J9622 Acute and chronic respiratory failure with hypercapnia: Principal | ICD-10-CM | POA: Diagnosis present

## 2023-10-16 DIAGNOSIS — I10 Essential (primary) hypertension: Secondary | ICD-10-CM | POA: Diagnosis present

## 2023-10-16 DIAGNOSIS — J441 Chronic obstructive pulmonary disease with (acute) exacerbation: Secondary | ICD-10-CM | POA: Diagnosis present

## 2023-10-16 DIAGNOSIS — J9621 Acute and chronic respiratory failure with hypoxia: Secondary | ICD-10-CM | POA: Diagnosis present

## 2023-10-16 DIAGNOSIS — Z87891 Personal history of nicotine dependence: Secondary | ICD-10-CM | POA: Diagnosis not present

## 2023-10-16 DIAGNOSIS — Z803 Family history of malignant neoplasm of breast: Secondary | ICD-10-CM

## 2023-10-16 DIAGNOSIS — J121 Respiratory syncytial virus pneumonia: Secondary | ICD-10-CM | POA: Insufficient documentation

## 2023-10-16 LAB — COMPREHENSIVE METABOLIC PANEL
ALT: 15 U/L (ref 0–44)
AST: 26 U/L (ref 15–41)
Albumin: 3.6 g/dL (ref 3.5–5.0)
Alkaline Phosphatase: 48 U/L (ref 38–126)
Anion gap: 15 (ref 5–15)
BUN: 13 mg/dL (ref 8–23)
CO2: 27 mmol/L (ref 22–32)
Calcium: 8.8 mg/dL — ABNORMAL LOW (ref 8.9–10.3)
Chloride: 95 mmol/L — ABNORMAL LOW (ref 98–111)
Creatinine, Ser: 0.93 mg/dL (ref 0.44–1.00)
GFR, Estimated: >60 mL/min (ref >60)
Glucose, Bld: 162 mg/dL — ABNORMAL HIGH (ref 70–99)
Potassium: 3.4 mmol/L — ABNORMAL LOW (ref 3.5–5.1)
Sodium: 137 mmol/L (ref 135–145)
Total Bilirubin: 0.5 mg/dL (ref 0.0–1.2)
Total Protein: 6.6 g/dL (ref 6.5–8.1)

## 2023-10-16 LAB — CBC WITH DIFFERENTIAL/PLATELET
Abs Immature Granulocytes: 0.05 10*3/uL (ref 0.00–0.07)
Basophils Absolute: 0 10*3/uL (ref 0.0–0.1)
Basophils Relative: 0 %
Eosinophils Absolute: 0 10*3/uL (ref 0.0–0.5)
Eosinophils Relative: 0 %
HCT: 42.7 % (ref 36.0–46.0)
Hemoglobin: 13.8 g/dL (ref 12.0–15.0)
Immature Granulocytes: 1 %
Lymphocytes Relative: 18 %
Lymphs Abs: 1.4 10*3/uL (ref 0.7–4.0)
MCH: 31.2 pg (ref 26.0–34.0)
MCHC: 32.3 g/dL (ref 30.0–36.0)
MCV: 96.6 fL (ref 80.0–100.0)
Monocytes Absolute: 0.4 10*3/uL (ref 0.1–1.0)
Monocytes Relative: 5 %
Neutro Abs: 5.6 10*3/uL (ref 1.7–7.7)
Neutrophils Relative %: 76 %
Platelets: 303 10*3/uL (ref 150–400)
RBC: 4.42 MIL/uL (ref 3.87–5.11)
RDW: 14.4 % (ref 11.5–15.5)
WBC: 7.4 10*3/uL (ref 4.0–10.5)
nRBC: 0 % (ref 0.0–0.2)

## 2023-10-16 LAB — BLOOD GAS, VENOUS
Acid-base deficit: 3.7 mmol/L — ABNORMAL HIGH (ref 0.0–2.0)
Bicarbonate: 23.5 mmol/L (ref 20.0–28.0)
Drawn by: 59759
O2 Saturation: 88 %
Patient temperature: 37
pCO2, Ven: 50 mm[Hg] (ref 44–60)
pH, Ven: 7.28 (ref 7.25–7.43)
pO2, Ven: 54 mm[Hg] — ABNORMAL HIGH (ref 32–45)

## 2023-10-16 LAB — MAGNESIUM: Magnesium: 1.9 mg/dL (ref 1.7–2.4)

## 2023-10-16 LAB — I-STAT VENOUS BLOOD GAS, ED
Acid-Base Excess: 2 mmol/L (ref 0.0–2.0)
Bicarbonate: 31.2 mmol/L — ABNORMAL HIGH (ref 20.0–28.0)
Calcium, Ion: 1.13 mmol/L — ABNORMAL LOW (ref 1.15–1.40)
HCT: 43 % (ref 36.0–46.0)
Hemoglobin: 14.6 g/dL (ref 12.0–15.0)
O2 Saturation: 66 %
Potassium: 3.3 mmol/L — ABNORMAL LOW (ref 3.5–5.1)
Sodium: 136 mmol/L (ref 135–145)
TCO2: 33 mmol/L — ABNORMAL HIGH (ref 22–32)
pCO2, Ven: 69.5 mm[Hg] — ABNORMAL HIGH (ref 44–60)
pH, Ven: 7.259 (ref 7.25–7.43)
pO2, Ven: 41 mm[Hg] (ref 32–45)

## 2023-10-16 LAB — HIV ANTIBODY (ROUTINE TESTING W REFLEX): HIV Screen 4th Generation wRfx: NONREACTIVE

## 2023-10-16 LAB — GLUCOSE, CAPILLARY: Glucose-Capillary: 144 mg/dL — ABNORMAL HIGH (ref 70–99)

## 2023-10-16 LAB — TSH: TSH: 0.099 u[IU]/mL — ABNORMAL LOW (ref 0.350–4.500)

## 2023-10-16 LAB — T4, FREE: Free T4: 1.48 ng/dL — ABNORMAL HIGH (ref 0.61–1.12)

## 2023-10-16 LAB — HEMOGLOBIN A1C
Hgb A1c MFr Bld: 6.4 % — ABNORMAL HIGH (ref 4.8–5.6)
Mean Plasma Glucose: 136.98 mg/dL

## 2023-10-16 LAB — PROCALCITONIN: Procalcitonin: <0.1 ng/mL

## 2023-10-16 MED ORDER — ADULT MULTIVITAMIN W/MINERALS CH
1.0000 | ORAL_TABLET | Freq: Every day | ORAL | Status: DC
  Administered 2023-10-17 – 2023-10-20 (×4): 1 via ORAL
  Filled 2023-10-16 (×4): qty 1

## 2023-10-16 MED ORDER — CYCLOBENZAPRINE HCL 5 MG PO TABS
5.0000 mg | ORAL_TABLET | Freq: Two times a day (BID) | ORAL | Status: DC
  Administered 2023-10-16 – 2023-10-20 (×8): 5 mg via ORAL
  Filled 2023-10-16 (×8): qty 1

## 2023-10-16 MED ORDER — BUDESONIDE 0.5 MG/2ML IN SUSP
0.5000 mg | Freq: Two times a day (BID) | RESPIRATORY_TRACT | Status: DC
  Administered 2023-10-16 (×2): 0.5 mg via RESPIRATORY_TRACT
  Filled 2023-10-16 (×2): qty 2

## 2023-10-16 MED ORDER — UMECLIDINIUM BROMIDE 62.5 MCG/ACT IN AEPB
1.0000 | INHALATION_SPRAY | Freq: Every day | RESPIRATORY_TRACT | Status: DC
  Filled 2023-10-16: qty 7

## 2023-10-16 MED ORDER — LEVOTHYROXINE SODIUM 88 MCG PO TABS
88.0000 ug | ORAL_TABLET | Freq: Every morning | ORAL | Status: DC
Start: 2023-10-17 — End: 2023-10-17
  Administered 2023-10-17: 88 ug via ORAL
  Filled 2023-10-16: qty 1

## 2023-10-16 MED ORDER — LEVOTHYROXINE SODIUM 100 MCG PO TABS: 100.0000 ug | ORAL_TABLET | Freq: Every morning | ORAL | Status: DC

## 2023-10-16 MED ORDER — ENOXAPARIN SODIUM 30 MG/0.3ML IJ SOSY
30.0000 mg | PREFILLED_SYRINGE | INTRAMUSCULAR | Status: DC
  Administered 2023-10-16 – 2023-10-19 (×4): 30 mg via SUBCUTANEOUS
  Filled 2023-10-16 (×3): qty 0.3

## 2023-10-16 MED ORDER — ROFLUMILAST 500 MCG PO TABS
500.0000 ug | ORAL_TABLET | Freq: Every day | ORAL | Status: DC
  Administered 2023-10-17 – 2023-10-19 (×3): 500 ug via ORAL
  Filled 2023-10-16 (×4): qty 1

## 2023-10-16 MED ORDER — IPRATROPIUM-ALBUTEROL 0.5-2.5 (3) MG/3ML IN SOLN
3.0000 mL | Freq: Four times a day (QID) | RESPIRATORY_TRACT | Status: DC
  Administered 2023-10-16 – 2023-10-17 (×3): 3 mL via RESPIRATORY_TRACT
  Filled 2023-10-16 (×3): qty 3

## 2023-10-16 MED ORDER — METHYLPREDNISOLONE SODIUM SUCC 125 MG IJ SOLR
125.0000 mg | Freq: Once | INTRAMUSCULAR | Status: AC
  Administered 2023-10-16: 125 mg via INTRAVENOUS
  Filled 2023-10-16: qty 2

## 2023-10-16 MED ORDER — ACETAMINOPHEN 650 MG RE SUPP: 650.0000 mg | Freq: Four times a day (QID) | RECTAL | Status: DC | PRN

## 2023-10-16 MED ORDER — FLUTICASONE FUROATE-VILANTEROL 100-25 MCG/ACT IN AEPB: 1.0000 | INHALATION_SPRAY | Freq: Every day | RESPIRATORY_TRACT | Status: DC

## 2023-10-16 MED ORDER — SODIUM CHLORIDE 0.9 % IV BOLUS
1000.0000 mL | Freq: Once | INTRAVENOUS | Status: AC
  Administered 2023-10-16: 1000 mL via INTRAVENOUS

## 2023-10-16 MED ORDER — ALBUTEROL SULFATE (2.5 MG/3ML) 0.083% IN NEBU: 2.5000 mg | INHALATION_SOLUTION | RESPIRATORY_TRACT | Status: DC | PRN

## 2023-10-16 MED ORDER — IPRATROPIUM-ALBUTEROL 0.5-2.5 (3) MG/3ML IN SOLN
3.0000 mL | RESPIRATORY_TRACT | Status: DC | PRN
  Administered 2023-10-16: 3 mL via RESPIRATORY_TRACT
  Filled 2023-10-16: qty 3

## 2023-10-16 MED ORDER — ASPIRIN 81 MG PO TBEC: 81.0000 mg | DELAYED_RELEASE_TABLET | Freq: Every day | ORAL | Status: DC

## 2023-10-16 MED ORDER — LORAZEPAM 2 MG/ML IJ SOLN
0.5000 mg | Freq: Once | INTRAMUSCULAR | Status: AC
  Administered 2023-10-16: 0.5 mg via INTRAVENOUS
  Filled 2023-10-16: qty 1

## 2023-10-16 MED ORDER — ACETAMINOPHEN 325 MG PO TABS: 650.0000 mg | ORAL_TABLET | Freq: Four times a day (QID) | ORAL | Status: DC | PRN

## 2023-10-16 MED ORDER — SODIUM CHLORIDE 0.9 % IV SOLN
1.0000 g | Freq: Once | INTRAVENOUS | Status: AC
  Administered 2023-10-16: 1 g via INTRAVENOUS
  Filled 2023-10-16: qty 10

## 2023-10-16 MED ORDER — SODIUM CHLORIDE 0.9 % IV SOLN
1.0000 g | INTRAVENOUS | Status: DC
  Administered 2023-10-17: 1 g via INTRAVENOUS
  Filled 2023-10-16: qty 10

## 2023-10-16 MED ORDER — AMITRIPTYLINE HCL 50 MG PO TABS
50.0000 mg | ORAL_TABLET | Freq: Every day | ORAL | Status: DC
  Administered 2023-10-16 – 2023-10-19 (×4): 50 mg via ORAL
  Filled 2023-10-16 (×5): qty 1

## 2023-10-16 MED ORDER — SODIUM CHLORIDE 0.9 % IV SOLN: INTRAVENOUS | Status: DC

## 2023-10-16 MED ORDER — HYDROXYUREA 500 MG PO CAPS
500.0000 mg | ORAL_CAPSULE | ORAL | Status: DC
Start: 2023-10-17 — End: 2023-10-20
  Administered 2023-10-17 – 2023-10-19 (×2): 500 mg via ORAL
  Filled 2023-10-16 (×2): qty 1

## 2023-10-16 MED ORDER — IPRATROPIUM-ALBUTEROL 0.5-2.5 (3) MG/3ML IN SOLN
3.0000 mL | Freq: Once | RESPIRATORY_TRACT | Status: AC
  Administered 2023-10-16: 3 mL via RESPIRATORY_TRACT
  Filled 2023-10-16: qty 3

## 2023-10-16 NOTE — Assessment & Plan Note (Signed)
Continue ASA/repatha

## 2023-10-16 NOTE — Assessment & Plan Note (Addendum)
Secondary to respiratory distress Improving on bipap, but also just had neb treatment Continue IVF and monitor closely  Check TSH/magnesium  TSH low, decrease synthroid from 100>75mcg and  add on free T4

## 2023-10-16 NOTE — ED Provider Notes (Signed)
EMERGENCY DEPARTMENT AT [facility] Provider Note  CSN: [id number] Arrival date & time: 10/16/23 9147  Chief Complaint(s) Shortness of Breath and RSV+  HPI Cheryl Haley is a 68 y.o. female history of COPD, diabetes, hypertension, hyperlipidemia, on chronic 3 L oxygen at home presenting to the emergency department shortness of breath.  Patient reports couple days ago was having cough, went to outside [facility] ER, was diagnosed with RSV.  Was prescribed steroids.  Reports yesterday was feeling well aside from cough.  Today, patient woke up and reports significant shortness of breath.  No sore throat, runny nose.  No fevers or chills.  No chest pain.  No abdominal pain.  No leg swelling.  Paramedics gave patient DuoNeb with improvement, was initially apparently 67% on home oxygen.  Patient took dose of the steroids that were prescribed, 40 mg prednisone.  Did not receive steroids with paramedics   Past Medical History Past Medical History:  Diagnosis Date   Agatston coronary artery calcium score greater than 400    score 678   Anxiety    Arthritis    "in neck"   COPD (chronic obstructive pulmonary disease) (HCC)    Diabetes (HCC)    Dyspnea    Emphysema lung (HCC)    Essential thrombocythemia (HCC) 09/15/2019   History of hiatal hernia    Hx of TIA (transient ischemic attack) and stroke    Hyperlipidemia    Hypertension    Hypothyroidism    Impaired fasting glucose    Pleural effusion 05/15/2014   CXR 05/2014:  New small right effusion.     Pneumonia    Protein-calorie malnutrition, severe 09/17/2018   Pulmonary nodules 07/14/2018   07/2017 CT chest >pulmonary nodules noted.  07/2017 PET scan -Hypermetabolic LUL nodule  12/2017 CT chest >decreased nodule size, new pulmonary nodules noted.  FOB -atypical cells  CT chest 07/2018 >stable nodules to smaller on established nodules . 2 new nodules RUL , RLL >CT chest 3 months    Thyroid disease    Tobacco abuse  11/28/2015   Smoked 2 packs of cigarettes daily from teenage years until age 30, quit in 2016    Unspecified hypothyroidism 09/20/2013   Viral respiratory infection 09/16/2018   Patient Active Problem List   Diagnosis Date Noted   Acute on chronic respiratory failure with hypoxia (HCC) 10/16/2023   RSV (respiratory syncytial virus pneumonia) 10/16/2023   Rectal bleeding 03/16/2023   Hyperlipidemia 09/02/2022   COPD (chronic obstructive pulmonary disease) (HCC) 01/11/2022   S/P bronchoscopy with biopsy    Agatston coronary artery calcium score greater than 400 07/16/2021   COVID 03/28/2021   Hx of TIA (transient ischemic attack) and stroke    Oral thrush 09/10/2020   Medication management 09/10/2020   Abnormal findings on diagnostic imaging of lung 09/10/2020   Healthcare maintenance 09/10/2020   Acute on chronic respiratory failure with hypoxia and hypercapnia (HCC)    Hyponatremia 08/21/2020   Hypokalemia 08/21/2020   Essential thrombocythemia (HCC) 09/15/2019   Protein-calorie malnutrition, severe 09/17/2018   COPD exacerbation (HCC) 09/15/2018   Pulmonary nodules 07/14/2018   Pulmonary nodule, left    Chronic respiratory failure with hypoxia (HCC) 06/10/2016   Pleural effusion 05/15/2014   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 Authorizing  Provider  albuterol (PROVENTIL) (2.5 MG/3ML) 0.083% nebulizer solution Take 3 mLs (2.5 mg total) by nebulization every 6 (six) hours as needed for wheezing or shortness of breath. 03/09/23 03/08/24  Parrett, Virgel Bouquet, NP  albuterol (VENTOLIN HFA) 108 (90 Base) MCG/ACT inhaler Inhale 2 puffs into the lungs every 6 (six) hours as needed for wheezing or shortness of breath. 04/14/22   Josephine Igo, DO   alendronate (FOSAMAX) 70 MG tablet Take 70 mg by mouth every Saturday. 06/13/21   [provider]  amitriptyline (ELAVIL) 50 MG tablet Take 50 mg by mouth at bedtime.    [provider]  aspirin EC 81 MG tablet Take 81 mg daily by mouth.     [provider]  benzonatate (TESSALON) 100 MG capsule Take 1 capsule (100 mg total) by mouth every 8 (eight) hours. 10/14/23   Smitty Knudsen, PA-C  DALIRESP 500 MCG TABS tablet Take 1 tablet by mouth once daily 11/10/22   Kalman Shan, MD  Evolocumab (REPATHA SURECLICK) 140 MG/ML SOAJ Inject 140 mg into the skin every 14 (fourteen) days. 08/21/23   Quintella Reichert, MD  Evolocumab (REPATHA SURECLICK) 140 MG/ML SOAJ Inject 140 mg into the skin every 14 (fourteen) days. Patient not taking: Reported on 10/12/2023 08/24/23   Quintella Reichert, MD  Fluticasone-Umeclidin-Vilant (TRELEGY ELLIPTA) 100-62.5-25 MCG/ACT AEPB Inhale 1 puff into the lungs daily. 08/21/23   Icard, Rachel Bo, DO  hydroxyurea (HYDREA) 500 MG capsule TAKE ONE CAPSULE BY MOUTH EVERY OTHER DAY WITH FOOD TO MINIMIZE GI SIDE EFFECTS 10/12/23   Rushie Chestnut, PA-C  levothyroxine (SYNTHROID) 100 MCG tablet Take 100 mcg by mouth every morning. 08/03/23   [provider]  metoprolol succinate (TOPROL-XL) 25 MG 24 hr tablet Take 1 tablet (25 mg total) by mouth daily. 09/15/22   Quintella Reichert, MD  Multiple Vitamin (MULTIVITAMIN WITH MINERALS) TABS tablet Take 1 tablet by mouth daily.    [provider]  OXYGEN Inhale 2 L into the lungs continuous.    [provider]  predniSONE (DELTASONE) 10 MG tablet Take 4 tablets (40 mg total) by mouth daily for 5 days. 10/14/23 10/19/23  Smitty Knudsen, PA-C  predniSONE (DELTASONE) 5 MG tablet TAKE 1 TABLET BY MOUTH EVERY DAY WITH BREAKFAST 10/11/23   Parrett, Tammy S, NP  roflumilast (DALIRESP) 500 MCG TABS tablet Take 1 tablet (500 mcg total) by mouth daily. 09/08/23   Julio Sicks, NP                                                                                                                                     Past Surgical History Past Surgical History:  Procedure Laterality Date   BRONCHIAL BIOPSY  08/20/2021   Procedure: BRONCHIAL BIOPSIES;  Surgeon: Josephine Igo, DO;  Location: [facility];  Service: Pulmonary;;   BRONCHIAL BRUSHINGS  08/20/2021   Procedure: BRONCHIAL  BRUSHINGS;  Surgeon: Josephine Igo, DO;  Location: [facility];  Service: Pulmonary;;   BRONCHIAL NEEDLE ASPIRATION BIOPSY  08/20/2021   Procedure: BRONCHIAL NEEDLE ASPIRATION BIOPSIES;  Surgeon: Josephine Igo, DO;  Location: [facility];  Service: Pulmonary;;   BRONCHIAL WASHINGS  08/20/2021   Procedure: BRONCHIAL WASHINGS;  Surgeon: Josephine Igo, DO;  Location: [facility];  Service: Pulmonary;;   COLONOSCOPY WITH PROPOFOL N/A 04/06/2023   Procedure: COLONOSCOPY WITH PROPOFOL;  Surgeon: Imogene Burn, MD;  Location: [facility];  Service: Gastroenterology;  Laterality: N/A;   ESOPHAGOGASTRODUODENOSCOPY     EYE SURGERY     FIDUCIAL MARKER PLACEMENT  08/20/2021   Procedure: FIDUCIAL MARKER PLACEMENT;  Surgeon: Josephine Igo, DO;  Location: [facility];  Service: Pulmonary;;   POLYPECTOMY  04/06/2023   Procedure: POLYPECTOMY;  Surgeon: Imogene Burn, MD;  Location: [facility];  Service: Gastroenterology;;   VIDEO BRONCHOSCOPY WITH ENDOBRONCHIAL NAVIGATION Left 07/29/2017   Procedure: VIDEO BRONCHOSCOPY WITH ENDOBRONCHIAL NAVIGATION;  Surgeon: Leslye Peer, MD;  Location: [facility] OR;  Service: Thoracic;  Laterality: Left;   VIDEO BRONCHOSCOPY WITH RADIAL ENDOBRONCHIAL ULTRASOUND  08/20/2021   Procedure: RADIAL ENDOBRONCHIAL ULTRASOUND;  Surgeon: Josephine Igo, DO;  Location: [facility];  Service: Pulmonary;;   Family History Family History  Problem Relation Age of Onset   Heart disease Mother    Emphysema Father    Heart disease Father    Emphysema Sister    Emphysema Sister    Heart  disease Sister    Congestive Heart Failure Sister    Congestive Heart Failure Sister    Cancer Sister        lung   Cancer Sister        breast   Emphysema Brother    Heart disease Brother    Heart disease Brother    Liver disease Neg Hx    Esophageal cancer Neg Hx    Colon cancer Neg Hx     Social History Social History   Tobacco Use   Smoking status: Former    Current packs/day: 0.00    Average packs/day: 2.0 packs/day for 40.0 years (80.0 ttl pk-yrs)    Types: Cigarettes    Start date: 03/16/1977    Quit date: 03/16/2017    Years since quitting: 6.5   Smokeless tobacco: Never   Tobacco comments:       Vaping Use   Vaping status: Never Used  Substance Use Topics   Alcohol use: Not Currently   Drug use: No   Allergies Bisoprolol fumarate  Review of Systems Review of Systems  All other systems reviewed and are negative.   Physical Exam Vital Signs  I have reviewed the triage vital signs BP (!) 98/55   Pulse (!) 125   Temp 97.8 F (36.6 C)   Resp (!) 33   Ht 5\' 4"  (1.626 m)   Wt 47 kg   SpO2 95%   BMI 17.79 kg/m  Physical Exam Vitals and nursing note reviewed.  Constitutional:      General: She is in acute distress.     Appearance: She is well-developed.  HENT:     Head: Normocephalic and atraumatic.     Mouth/Throat:     Mouth: Mucous membranes are moist.  Eyes:     Pupils: Pupils are equal, round, and reactive to light.  Cardiovascular:     Rate and Rhythm: Regular rhythm. Tachycardia present.     Heart sounds: No murmur heard.  Pulmonary:     Effort: Tachypnea, accessory muscle usage and respiratory distress present.     Breath sounds: Examination of the right-upper field reveals wheezing. Examination of the left-upper field reveals wheezing. Examination of the right-middle field reveals wheezing. Examination of the left-middle field reveals wheezing. Examination of the right-lower field reveals wheezing. Examination of the left-lower field  reveals wheezing. Wheezing present.  Abdominal:     General: Abdomen is flat.     Palpations: Abdomen is soft.     Tenderness: There is no abdominal tenderness.  Musculoskeletal:        General: No tenderness.     Right lower leg: No edema.     Left lower leg: No edema.  Skin:    General: Skin is warm and dry.  Neurological:     General: No focal deficit present.     Mental Status: She is alert. Mental status is at baseline.  Psychiatric:        Mood and Affect: Mood is anxious.        Behavior: Behavior normal.     ED Results and Treatments Labs (all labs ordered are listed, but only abnormal results are displayed) Labs Reviewed  COMPREHENSIVE METABOLIC PANEL - Abnormal; Notable for the following components:      Result Value   Potassium 3.4 (*)    Chloride 95 (*)    Glucose, Bld 162 (*)    Calcium 8.8 (*)    All other components within normal limits  I-STAT VENOUS BLOOD GAS, ED - Abnormal; Notable for the following components:   pCO2, Ven 69.5 (*)    Bicarbonate 31.2 (*)    TCO2 33 (*)    Potassium 3.3 (*)    Calcium, Ion 1.13 (*)    All other components within normal limits  CBC WITH DIFFERENTIAL/PLATELET  HEMOGLOBIN A1C  TSH  PROCALCITONIN                                                                                                                          Radiology DG Chest Portable 1 View Result Date: 10/16/2023 CLINICAL DATA:  Cough. EXAM: PORTABLE CHEST 1 VIEW COMPARISON:  10/14/2023. FINDINGS: Bilateral lungs appear hyperexpanded and hyperlucent with coarse bronchovascular markings, in keeping with COPD. There is stable scarring overlying the right lung apex. Bilateral lungs otherwise appear clear. No dense consolidation or lung collapse. Apparent blunting of right lateral costophrenic angle is similar to the prior study and favored to represent pleural thickening. Left lateral costophrenic angle is clear. Normal cardio-mediastinal silhouette. No acute osseous  abnormalities. The soft tissues are within normal limits. IMPRESSION: *No acute cardiopulmonary abnormality.  COPD. Electronically Signed   By: Jules Schick M.D.   On: 10/16/2023 11:28    Pertinent labs & imaging results that were available during my care of the patient were reviewed by me and considered in my medical decision making (see MDM for details).  Medications Ordered in ED Medications  ipratropium-albuterol (DUONEB)  0.5-2.5 (3) MG/3ML nebulizer solution 3 mL (3 mLs Nebulization Given 10/16/23 1114)  budesonide (PULMICORT) nebulizer solution 0.5 mg (has no administration in time range)  methylPREDNISolone sodium succinate (SOLU-MEDROL) 125 mg/2 mL injection 125 mg (125 mg Intravenous Given 10/16/23 1015)  sodium chloride 0.9 % bolus 1,000 mL (0 mLs Intravenous Stopped 10/16/23 1130)  LORazepam (ATIVAN) injection 0.5 mg (0.5 mg Intravenous Given 10/16/23 1016)  ipratropium-albuterol (DUONEB) 0.5-2.5 (3) MG/3ML nebulizer solution 3 mL (3 mLs Nebulization Given 10/16/23 1016)  cefTRIAXone (ROCEPHIN) 1 g in sodium chloride 0.9 % 100 mL IVPB (0 g Intravenous Stopped 10/16/23 1247)  sodium chloride 0.9 % bolus 1,000 mL (1,000 mLs Intravenous New Bag/Given 10/16/23 1247)                                                                                                                                     Procedures .Critical Care  Performed by: Lonell Grandchild, MD Authorized by: Lonell Grandchild, MD   Critical care provider statement:    Critical care time (minutes):  30   Critical care was necessary to treat or prevent imminent or life-threatening deterioration of the following conditions:  Respiratory failure   Critical care was time spent personally by me on the following activities:  Development of treatment plan with patient or surrogate, discussions with consultants, evaluation of patient's response to treatment, examination of patient, ordering and review of laboratory studies,  ordering and review of radiographic studies, ordering and performing treatments and interventions, pulse oximetry, re-evaluation of patient's condition and review of old charts   (including critical care time)  Medical Decision Making / ED Course   MDM:  68 year old presenting to the emergency department shortness of breath.  Patient in mild distress, with respiratory distress, tachypnea, diffuse wheezing on exam.  She also appears anxious and reports anxiety and requests medicine for anxiety.  Suspect likely due to underlying COPD exacerbation in the setting of RSV.  Had chest x-ray few days ago which is negative but will repeat chest x-ray to evaluate for development of interval pneumonia.  Will give additional DuoNeb.  Patient does have prednisone she took this morning relatively low so we will give 125 mg Solu-Medrol.  Will reassess, anticipate patient may need to be admitted given her still significant increased work of breathing.  Differential included but lower concern for other process such as pulmonary embolism, ACS, pneumothorax, severe anemia  Clinical Course as of 10/16/23 1311  Fri Oct 16, 2023  1019 pH, Ven: 7.259 [WS]  1019 pCO2, Ven(!): 69.5 Mild CO2 retention with low pH, will start bipap  [WS]  1308 Patient feeling better.  Blood pressure slightly low improved on recheck.  Will give additional IV fluids.  Discussed with Dr. Sheppard Penton who will admit the patient for further management of acute COPD exacerbation. [WS]    Clinical Course User Index [WS] Lonell Grandchild, MD  Additional history obtained: -Additional history obtained from family -External records from outside source obtained and reviewed including: Chart review including previous notes, labs, imaging, consultation notes including prior notes    Lab Tests: -I ordered, reviewed, and interpreted labs.   The pertinent results include:   Labs Reviewed  COMPREHENSIVE METABOLIC PANEL - Abnormal; Notable  for the following components:      Result Value   Potassium 3.4 (*)    Chloride 95 (*)    Glucose, Bld 162 (*)    Calcium 8.8 (*)    All other components within normal limits  I-STAT VENOUS BLOOD GAS, ED - Abnormal; Notable for the following components:   pCO2, Ven 69.5 (*)    Bicarbonate 31.2 (*)    TCO2 33 (*)    Potassium 3.3 (*)    Calcium, Ion 1.13 (*)    All other components within normal limits  CBC WITH DIFFERENTIAL/PLATELET  HEMOGLOBIN A1C  TSH  PROCALCITONIN    Notable for elevated pCO2  EKG   EKG Interpretation Date/Time:  Friday October 16 2023 10:08:28 EST Ventricular Rate:  137 PR Interval:  137 QRS Duration:  109 QT Interval:  297 QTC Calculation: 449 R Axis:   87  Text Interpretation: Sinus tachycardia Consider right atrial enlargement Artifact in lead(s) I II III aVF V6 No significant change since last tracing Confirmed by Alvino Blood ([id number]) on 10/16/2023 10:13:58 AM         Imaging Studies ordered: I ordered imaging studies including CXR On my interpretation imaging demonstrates no acute process I independently visualized and interpreted imaging. I agree with the radiologist interpretation   Medicines ordered and prescription drug management: Meds ordered this encounter  Medications   methylPREDNISolone sodium succinate (SOLU-MEDROL) 125 mg/2 mL injection 125 mg    IV methylprednisolone will be converted to either a q12h or q24h frequency with the same total daily dose (TDD).  Ordered Dose: 1 to 125 mg TDD; convert to: TDD q24h.  Ordered Dose: 126 to 250 mg TDD; convert to: TDD div q12h.  Ordered Dose: >250 mg TDD; DAW.   sodium chloride 0.9 % bolus 1,000 mL   LORazepam (ATIVAN) injection 0.5 mg   ipratropium-albuterol (DUONEB) 0.5-2.5 (3) MG/3ML nebulizer solution 3 mL   ipratropium-albuterol (DUONEB) 0.5-2.5 (3) MG/3ML nebulizer solution 3 mL   cefTRIAXone (ROCEPHIN) 1 g in sodium chloride 0.9 % 100 mL IVPB    Antibiotic  Indication::   Other Indication (list below)    Other Indication::   COPD   sodium chloride 0.9 % bolus 1,000 mL   budesonide (PULMICORT) nebulizer solution 0.5 mg    -I have reviewed the patients home medicines and have made adjustments as needed   Consultations Obtained: I requested consultation with the hospitalist,  and discussed lab and imaging findings as well as pertinent plan - they recommend: admission   Cardiac Monitoring: The patient was maintained on a cardiac monitor.  I personally viewed and interpreted the cardiac monitored which showed an underlying rhythm of: sinus tachycardia  Social Determinants of Health:  Diagnosis or treatment significantly limited by social determinants of health: former smoker   Reevaluation: After the interventions noted above, I reevaluated the patient and found that their symptoms have improved  Co morbidities that complicate the patient evaluation  Past Medical History:  Diagnosis Date   Agatston coronary artery calcium score greater than 400    score 678   Anxiety    Arthritis    "in neck"  COPD (chronic obstructive pulmonary disease) (HCC)    Diabetes (HCC)    Dyspnea    Emphysema lung (HCC)    Essential thrombocythemia (HCC) 09/15/2019   History of hiatal hernia    Hx of TIA (transient ischemic attack) and stroke    Hyperlipidemia    Hypertension    Hypothyroidism    Impaired fasting glucose    Pleural effusion 05/15/2014   CXR 05/2014:  New small right effusion.     Pneumonia    Protein-calorie malnutrition, severe 09/17/2018   Pulmonary nodules 07/14/2018   07/2017 CT chest >pulmonary nodules noted.  07/2017 PET scan -Hypermetabolic LUL nodule  12/2017 CT chest >decreased nodule size, new pulmonary nodules noted.  FOB -atypical cells  CT chest 07/2018 >stable nodules to smaller on established nodules . 2 new nodules RUL , RLL >CT chest 3 months    Thyroid disease    Tobacco abuse 11/28/2015   Smoked 2 packs of  cigarettes daily from teenage years until age 8, quit in 2016    Unspecified hypothyroidism 09/20/2013   Viral respiratory infection 09/16/2018      Dispostion: Disposition decision including need for hospitalization was considered, and patient admitted to the hospital.    Final Clinical Impression(s) / ED Diagnoses Final diagnoses:  Acute on chronic respiratory failure with hypoxia and hypercapnia (HCC)  COPD exacerbation (HCC)     This chart was dictated using voice recognition software.  Despite best efforts to proofread,  errors can occur which can change the documentation meaning.    Lonell Grandchild, MD 10/16/23 1311

## 2023-10-16 NOTE — Assessment & Plan Note (Signed)
On repatha

## 2023-10-16 NOTE — Progress Notes (Signed)
Transported pt from ED35 to ED44 with bedside NT. Vitals are stable.

## 2023-10-16 NOTE — ED Notes (Signed)
Pt transitioned to nasal cannula successfully. Pt denies distress.

## 2023-10-16 NOTE — Assessment & Plan Note (Signed)
68 yo with COPD on chronic home oxygen at 3L presented to ED hypoxic to 67% with EMS on her 3L now on bipap secondary to COPD exacerbation in setting of RSV infection  -admit to progressive with droplet precautions  -venous VBG with normal pH  -more comfortable on bipap, with some work of breathing. Wean as tolerated  -supportive care for RSV with pulmonary hygiene, IS, pulmicort nebs BID -tx of COPD exacerbation  -rocephin indicated for severity of exacerbation  -continue IV solumedrol  -schedule duoneb, PRN albuterol -continue home trellegy  -pulm hygiene/IS -sputum cx pending

## 2023-10-16 NOTE — H&P (Addendum)
History and Physical    Patient: Cheryl Haley WUJ:[id number] DOB: 1956-05-19 DOA: 10/16/2023 DOS: the patient was seen and examined on 10/16/2023 PCP: [facility]  Patient coming from: Home - lives with her sister. Uses a walker for ambulation    Chief Complaint: shortness of breath and hypoxia   HPI: Cheryl Haley is a 68 y.o. female with medical history significant of COPD with chronic respiratory failure on 3L oxygen, prediabetes, hx of TIA, HTN, HLD, hypothyroidism who was seen on 2/12 at [facility] and diagnosed with RSV. She was doing okay until last night/early this AM. She got to where she couldn't breath and when she woke up she was coughing. She told her brother in law that she couldn't breath and so they called 911. She doesn't feel wheezing or tight. She told her sister it hurt to breath yesterday and her chest was tight. She has been drinking, but has not been eating much over past few days.    He has been feeling good. Denies any fever/chills, vision changes/headaches, chest pain or palpitations, abdominal pain, N/V/D, dysuria or leg swelling.    She does not smoke or drink alcohol.   ER Course:  vitals: afebrile, bp: 119/65, HR: 102, RR: 25, oxygen: 100% on 3L >95% on bpap  Pertinent labs: potassium: 3.4, vbg: pH; 7.259,  COPD: no acute finding In ED: given 1L IVF, steroids, ativan, duoneb and rocephin. Known RSV. TRH asked to admit     Review of Systems: As mentioned in the history of present illness. All other systems reviewed and are negative. Past Medical History:  Diagnosis Date   Agatston coronary artery calcium score greater than 400    score 678   Anxiety    Arthritis    "in neck"   COPD (chronic obstructive pulmonary disease) (HCC)    Diabetes (HCC)    Dyspnea    Emphysema lung (HCC)    Essential thrombocythemia (HCC) 09/15/2019   History of hiatal hernia    Hx of TIA (transient ischemic attack) and stroke    Hyperlipidemia     Hypertension    Hypothyroidism    Impaired fasting glucose    Pleural effusion 05/15/2014   CXR 05/2014:  New small right effusion.     Pneumonia    Protein-calorie malnutrition, severe 09/17/2018   Pulmonary nodules 07/14/2018   07/2017 CT chest >pulmonary nodules noted.  07/2017 PET scan -Hypermetabolic LUL nodule  12/2017 CT chest >decreased nodule size, new pulmonary nodules noted.  FOB -atypical cells  CT chest 07/2018 >stable nodules to smaller on established nodules . 2 new nodules RUL , RLL >CT chest 3 months    Thyroid disease    Tobacco abuse 11/28/2015   Smoked 2 packs of cigarettes daily from teenage years until age 76, quit in 2016    Unspecified hypothyroidism 09/20/2013   Viral respiratory infection 09/16/2018   Past Surgical History:  Procedure Laterality Date   BRONCHIAL BIOPSY  08/20/2021   Procedure: BRONCHIAL BIOPSIES;  Surgeon: Josephine Igo, DO;  Location: [facility];  Service: Pulmonary;;   BRONCHIAL BRUSHINGS  08/20/2021   Procedure: BRONCHIAL BRUSHINGS;  Surgeon: Josephine Igo, DO;  Location: [facility];  Service: Pulmonary;;   BRONCHIAL NEEDLE ASPIRATION BIOPSY  08/20/2021   Procedure: BRONCHIAL NEEDLE ASPIRATION BIOPSIES;  Surgeon: Josephine Igo, DO;  Location: [facility];  Service: Pulmonary;;   BRONCHIAL WASHINGS  08/20/2021   Procedure: BRONCHIAL WASHINGS;  Surgeon: Josephine Igo, DO;  Location: [facility];  Service: Pulmonary;;   COLONOSCOPY WITH PROPOFOL N/A 04/06/2023   Procedure: COLONOSCOPY WITH PROPOFOL;  Surgeon: Imogene Burn, MD;  Location: [facility];  Service: Gastroenterology;  Laterality: N/A;   ESOPHAGOGASTRODUODENOSCOPY     EYE SURGERY     FIDUCIAL MARKER PLACEMENT  08/20/2021   Procedure: FIDUCIAL MARKER PLACEMENT;  Surgeon: Josephine Igo, DO;  Location: [facility];  Service: Pulmonary;;   POLYPECTOMY  04/06/2023   Procedure: POLYPECTOMY;  Surgeon: Imogene Burn, MD;  Location: [facility];  Service:  Gastroenterology;;   VIDEO BRONCHOSCOPY WITH ENDOBRONCHIAL NAVIGATION Left 07/29/2017   Procedure: VIDEO BRONCHOSCOPY WITH ENDOBRONCHIAL NAVIGATION;  Surgeon: Leslye Peer, MD;  Location: [facility] OR;  Service: Thoracic;  Laterality: Left;   VIDEO BRONCHOSCOPY WITH RADIAL ENDOBRONCHIAL ULTRASOUND  08/20/2021   Procedure: RADIAL ENDOBRONCHIAL ULTRASOUND;  Surgeon: Josephine Igo, DO;  Location: [facility];  Service: Pulmonary;;   Social History:  reports that she quit smoking about 6 years ago. Her smoking use included cigarettes. She started smoking about 46 years ago. She has a 80 pack-year smoking history. She has never used smokeless tobacco. She reports that she does not currently use alcohol. She reports that she does not use drugs.  Allergies  Allergen Reactions   Bisoprolol Fumarate Other (See Comments)    Other reaction(s): heart issues    Family History  Problem Relation Age of Onset   Heart disease Mother    Emphysema Father    Heart disease Father    Emphysema Sister    Emphysema Sister    Heart disease Sister    Congestive Heart Failure Sister    Congestive Heart Failure Sister    Cancer Sister        lung   Cancer Sister        breast   Emphysema Brother    Heart disease Brother    Heart disease Brother    Liver disease Neg Hx    Esophageal cancer Neg Hx    Colon cancer Neg Hx     Prior to Admission medications   Medication Sig Start Date End Date Taking? Authorizing Provider  albuterol (PROVENTIL) (2.5 MG/3ML) 0.083% nebulizer solution Take 3 mLs (2.5 mg total) by nebulization every 6 (six) hours as needed for wheezing or shortness of breath. 03/09/23 03/08/24  Parrett, Virgel Bouquet, NP  albuterol (VENTOLIN HFA) 108 (90 Base) MCG/ACT inhaler Inhale 2 puffs into the lungs every 6 (six) hours as needed for wheezing or shortness of breath. 04/14/22   Josephine Igo, DO  alendronate (FOSAMAX) 70 MG tablet Take 70 mg by mouth every Saturday. 06/13/21   [provider]  amitriptyline (ELAVIL) 50 MG tablet Take 50 mg by mouth at bedtime.    [provider]  aspirin EC 81 MG tablet Take 81 mg daily by mouth.     [provider]  benzonatate (TESSALON) 100 MG capsule Take 1 capsule (100 mg total) by mouth every 8 (eight) hours. 10/14/23   Smitty Knudsen, PA-C  DALIRESP 500 MCG TABS tablet Take 1 tablet by mouth once daily 11/10/22   Kalman Shan, MD  Evolocumab (REPATHA SURECLICK) 140 MG/ML SOAJ Inject 140 mg into the skin every 14 (fourteen) days. 08/21/23   Quintella Reichert, MD  Evolocumab (REPATHA SURECLICK) 140 MG/ML SOAJ Inject 140 mg into the skin every 14 (fourteen) days. Patient not taking: Reported on 10/12/2023 08/24/23   Quintella Reichert, MD  Fluticasone-Umeclidin-Vilant (TRELEGY ELLIPTA) 100-62.5-25 MCG/ACT AEPB Inhale 1  puff into the lungs daily. 08/21/23   Icard, Rachel Bo, DO  hydroxyurea (HYDREA) 500 MG capsule TAKE ONE CAPSULE BY MOUTH EVERY OTHER DAY WITH FOOD TO MINIMIZE GI SIDE EFFECTS 10/12/23   Rushie Chestnut, PA-C  levothyroxine (SYNTHROID) 100 MCG tablet Take 100 mcg by mouth every morning. 08/03/23   [provider]  metoprolol succinate (TOPROL-XL) 25 MG 24 hr tablet Take 1 tablet (25 mg total) by mouth daily. 09/15/22   Quintella Reichert, MD  Multiple Vitamin (MULTIVITAMIN WITH MINERALS) TABS tablet Take 1 tablet by mouth daily.    [provider]  OXYGEN Inhale 2 L into the lungs continuous.    [provider]  predniSONE (DELTASONE) 10 MG tablet Take 4 tablets (40 mg total) by mouth daily for 5 days. 10/14/23 10/19/23  Smitty Knudsen, PA-C  predniSONE (DELTASONE) 5 MG tablet TAKE 1 TABLET BY MOUTH EVERY DAY WITH BREAKFAST 10/11/23   Parrett, Tammy S, NP  roflumilast (DALIRESP) 500 MCG TABS tablet Take 1 tablet (500 mcg total) by mouth daily. 09/08/23   Julio Sicks, NP    Physical Exam: Vitals:   10/16/23 1345 10/16/23 1415 10/16/23 1500 10/16/23 1530  BP: (!) 95/42 (!) 114/52 (!) 114/50  106/64  Pulse: (!) 119 (!) 118 (!) 118 (!) 121  Resp: (!) 24 19 (!) 30 20  Temp:      SpO2: 95% 99% 96% 96%  Weight:      Height:       General:  Appears calm, dyspneic with mild retractions and is in NAD. Thin.  Eyes:  PERRL, EOMI, normal lids, iris ENT:  grossly normal hearing, lips & tongue, mmm; appropriate dentition Neck:  no LAD, masses or thyromegaly; no carotid bruits Cardiovascular:  sinus tachycardia, no m/r/g. No LE edema.  Respiratory:   CTA bilaterally with no wheezes or rales. Mild retractions, but talking in complete sentences.  Abdomen:  soft, NT, ND, NABS Back:   normal alignment, no CVAT Skin:  no rash or induration seen on limited exam Musculoskeletal:  grossly normal tone BUE/BLE, good ROM, no bony abnormality Lower extremity:  No LE edema.  Limited foot exam with no ulcerations.  2+ distal pulses. Psychiatric:  grossly normal mood and affect, speech fluent and appropriate, AOx3 Neurologic:  CN 2-12 grossly intact, moves all extremities in coordinated fashion, sensation intact   Radiological Exams on Admission: Independently reviewed - see discussion in A/P where applicable  DG Chest Portable 1 View Result Date: 10/16/2023 CLINICAL DATA:  Cough. EXAM: PORTABLE CHEST 1 VIEW COMPARISON:  10/14/2023. FINDINGS: Bilateral lungs appear hyperexpanded and hyperlucent with coarse bronchovascular markings, in keeping with COPD. There is stable scarring overlying the right lung apex. Bilateral lungs otherwise appear clear. No dense consolidation or lung collapse. Apparent blunting of right lateral costophrenic angle is similar to the prior study and favored to represent pleural thickening. Left lateral costophrenic angle is clear. Normal cardio-mediastinal silhouette. No acute osseous abnormalities. The soft tissues are within normal limits. IMPRESSION: *No acute cardiopulmonary abnormality.  COPD. Electronically Signed   By: Jules Schick M.D.   On: 10/16/2023 11:28    EKG:  Independently reviewed.  NSR with rate 137; nonspecific ST changes with no evidence of acute ischemia   Labs on Admission: I have personally reviewed the available labs and imaging studies at the time of the admission.  Pertinent labs:   potassium: 3.4,  vbg: pH; 7.259,  RSV + on 10/14/23   Assessment and Plan:  Principal Problem:   Acute on chronic respiratory failure with hypoxia secondary to RSV with acute COPD exacerbation Active Problems:   HTN (hypertension)   Hypothyroidism   Sinus tachycardia   Hyperlipidemia   Hx of TIA (transient ischemic attack) and stroke   COPD with acute exacerbation (HCC)   RSV (respiratory syncytial virus pneumonia)    Assessment and Plan: * Acute on chronic respiratory failure with hypoxia secondary to RSV with acute COPD exacerbation 68 yo with COPD on chronic home oxygen at 3L presented to ED hypoxic to 67% with EMS on her 3L now on bipap secondary to COPD exacerbation in setting of RSV infection  -admit to progressive with droplet precautions  -venous VBG with normal pH  -more comfortable on bipap, with some work of breathing. Wean as tolerated  -supportive care for RSV with pulmonary hygiene, IS, pulmicort nebs BID -tx of COPD exacerbation  -rocephin indicated for severity of exacerbation  -continue IV solumedrol  -schedule duoneb, PRN albuterol -continue home trellegy  -pulm hygiene/IS -sputum cx pending    HTN (hypertension) Bp soft, hold toprol-xl for now   Sinus tachycardia Secondary to respiratory distress Improving on bipap, but also just had neb treatment Continue IVF and monitor closely  Check TSH/magnesium  TSH low, decrease synthroid from 100>58mcg and  add on free T4  Hypothyroidism TSH pending Continue home synthroid   Hyperlipidemia On repatha   Hx of TIA (transient ischemic attack) and stroke Continue ASA/repatha     Advance Care Planning:   Code Status: Full Code   Consults: none   DVT Prophylaxis:  lovenox   Family Communication: sisters at bedside   Severity of Illness: The appropriate patient status for this patient is INPATIENT. Inpatient status is judged to be reasonable and necessary in order to provide the required intensity of service to ensure the patient's safety. The patient's presenting symptoms, physical exam findings, and initial radiographic and laboratory data in the context of their chronic comorbidities is felt to place them at high risk for further clinical deterioration. Furthermore, it is not anticipated that the patient will be medically stable for discharge from the hospital within 2 midnights of admission.   * I certify that at the point of admission it is my clinical judgment that the patient will require inpatient hospital care spanning beyond 2 midnights from the point of admission due to high intensity of service, high risk for further deterioration and high frequency of surveillance required.*  Author: Orland Mustard, MD 10/16/2023 4:15 PM  For on call review [email or web address].

## 2023-10-16 NOTE — Assessment & Plan Note (Signed)
TSH pending Continue home synthroid

## 2023-10-16 NOTE — Assessment & Plan Note (Signed)
Bp soft, hold toprol-xl for now

## 2023-10-16 NOTE — ED Notes (Signed)
Pt requesting break from bipap. RN cleared this with RT. Duoneb to be administered via simple mask and pt placed on [place] at baseline 3L.

## 2023-10-16 NOTE — ED Triage Notes (Addendum)
Pt to ED via EMS from home c/o SOB. +RSV h/o COPD. 2 duonebs in the field. Wears 3L at baseline. Found to be at 67% in the field. Is now low 90s. Took home prednisone before EMS arrived--EMS held solu medrol.

## 2023-10-17 DIAGNOSIS — R000 Tachycardia, unspecified: Secondary | ICD-10-CM | POA: Diagnosis not present

## 2023-10-17 DIAGNOSIS — I10 Essential (primary) hypertension: Secondary | ICD-10-CM

## 2023-10-17 DIAGNOSIS — J449 Chronic obstructive pulmonary disease, unspecified: Secondary | ICD-10-CM

## 2023-10-17 DIAGNOSIS — E039 Hypothyroidism, unspecified: Secondary | ICD-10-CM

## 2023-10-17 DIAGNOSIS — J9621 Acute and chronic respiratory failure with hypoxia: Secondary | ICD-10-CM | POA: Diagnosis not present

## 2023-10-17 LAB — CBC
HCT: 35.1 % — ABNORMAL LOW (ref 36.0–46.0)
Hemoglobin: 11.2 g/dL — ABNORMAL LOW (ref 12.0–15.0)
MCH: 31.2 pg (ref 26.0–34.0)
MCHC: 31.9 g/dL (ref 30.0–36.0)
MCV: 97.8 fL (ref 80.0–100.0)
Platelets: 243 10*3/uL (ref 150–400)
RBC: 3.59 MIL/uL — ABNORMAL LOW (ref 3.87–5.11)
RDW: 14.6 % (ref 11.5–15.5)
WBC: 4.9 10*3/uL (ref 4.0–10.5)
nRBC: 0 % (ref 0.0–0.2)

## 2023-10-17 LAB — BASIC METABOLIC PANEL
Anion gap: 6 (ref 5–15)
BUN: 6 mg/dL — ABNORMAL LOW (ref 8–23)
CO2: 28 mmol/L (ref 22–32)
Calcium: 7.6 mg/dL — ABNORMAL LOW (ref 8.9–10.3)
Chloride: 104 mmol/L (ref 98–111)
Creatinine, Ser: 0.79 mg/dL (ref 0.44–1.00)
GFR, Estimated: >60 mL/min (ref >60)
Glucose, Bld: 109 mg/dL — ABNORMAL HIGH (ref 70–99)
Potassium: 4.1 mmol/L (ref 3.5–5.1)
Sodium: 138 mmol/L (ref 135–145)

## 2023-10-17 LAB — GLUCOSE, CAPILLARY
Glucose-Capillary: 98 mg/dL (ref 70–99)
Glucose-Capillary: 124 mg/dL — ABNORMAL HIGH (ref 70–99)
Glucose-Capillary: 160 mg/dL — ABNORMAL HIGH (ref 70–99)

## 2023-10-17 LAB — PROCALCITONIN: Procalcitonin: <0.1 ng/mL

## 2023-10-17 MED ORDER — ARFORMOTEROL TARTRATE 15 MCG/2ML IN NEBU
15.0000 ug | INHALATION_SOLUTION | Freq: Two times a day (BID) | RESPIRATORY_TRACT | Status: DC
  Administered 2023-10-17 – 2023-10-20 (×7): 15 ug via RESPIRATORY_TRACT
  Filled 2023-10-17 (×7): qty 2

## 2023-10-17 MED ORDER — ENSURE ENLIVE PO LIQD
237.0000 mL | Freq: Two times a day (BID) | ORAL | Status: DC
  Administered 2023-10-17 – 2023-10-19 (×6): 237 mL via ORAL

## 2023-10-17 MED ORDER — IPRATROPIUM-ALBUTEROL 0.5-2.5 (3) MG/3ML IN SOLN
3.0000 mL | Freq: Three times a day (TID) | RESPIRATORY_TRACT | Status: DC
  Administered 2023-10-17 – 2023-10-20 (×10): 3 mL via RESPIRATORY_TRACT
  Filled 2023-10-17 (×10): qty 3

## 2023-10-17 MED ORDER — METHYLPREDNISOLONE SODIUM SUCC 40 MG IJ SOLR
40.0000 mg | Freq: Two times a day (BID) | INTRAMUSCULAR | Status: DC
  Administered 2023-10-17 – 2023-10-18 (×3): 40 mg via INTRAVENOUS
  Filled 2023-10-17 (×3): qty 1

## 2023-10-17 MED ORDER — LEVOTHYROXINE SODIUM 75 MCG PO TABS
75.0000 ug | ORAL_TABLET | Freq: Every morning | ORAL | Status: DC
  Administered 2023-10-18 – 2023-10-20 (×3): 75 ug via ORAL
  Filled 2023-10-17 (×3): qty 1

## 2023-10-17 MED ORDER — REVEFENACIN 175 MCG/3ML IN SOLN
175.0000 ug | Freq: Every day | RESPIRATORY_TRACT | Status: DC
  Administered 2023-10-17 – 2023-10-20 (×4): 175 ug via RESPIRATORY_TRACT
  Filled 2023-10-17 (×4): qty 3

## 2023-10-17 MED ORDER — GUAIFENESIN ER 600 MG PO TB12
600.0000 mg | ORAL_TABLET | Freq: Two times a day (BID) | ORAL | Status: DC
  Administered 2023-10-17 – 2023-10-20 (×7): 600 mg via ORAL
  Filled 2023-10-17 (×7): qty 1

## 2023-10-17 MED ORDER — AZITHROMYCIN 500 MG PO TABS
500.0000 mg | ORAL_TABLET | Freq: Every day | ORAL | Status: AC
  Administered 2023-10-17 – 2023-10-19 (×3): 500 mg via ORAL
  Filled 2023-10-17 (×3): qty 1

## 2023-10-17 MED ORDER — METOPROLOL SUCCINATE ER 25 MG PO TB24
25.0000 mg | ORAL_TABLET | Freq: Every day | ORAL | Status: DC
  Administered 2023-10-17 – 2023-10-20 (×4): 25 mg via ORAL
  Filled 2023-10-17 (×4): qty 1

## 2023-10-17 MED ORDER — BUDESONIDE 0.25 MG/2ML IN SUSP
0.2500 mg | Freq: Two times a day (BID) | RESPIRATORY_TRACT | Status: DC
  Administered 2023-10-17 – 2023-10-20 (×7): 0.25 mg via RESPIRATORY_TRACT
  Filled 2023-10-17 (×7): qty 2

## 2023-10-17 NOTE — Plan of Care (Signed)
  Problem: Health Behavior/Discharge Planning: Goal: Ability to manage health-related needs will improve Outcome: Progressing   Problem: Clinical Measurements: Goal: Will remain free from infection Outcome: Progressing Goal: Respiratory complications will improve Outcome: Progressing   Problem: Activity: Goal: Risk for activity intolerance will decrease Outcome: Progressing   Problem: Safety: Goal: Ability to remain free from injury will improve Outcome: Progressing   

## 2023-10-17 NOTE — Progress Notes (Signed)
 PROGRESS NOTE        PATIENT DETAILS Name: Cheryl Haley Age: 68 y.o. Sex: female Date of Birth: 02-02-1956 Admit Date: 10/16/2023 Admitting Physician Orland Mustard, MD [id number], [facility]  Brief Summary: Patient is a 68 y.o.  female with history of COPD on 3 L of oxygen at home,, HLD-who was diagnosed with RSV on 2/12-presented with shortness of breath-found to have COPD exacerbation and subsequently admitted to the hospitalist service.  Significant events: 2/14>> admit to [facility]-COPD exacerbation  Significant studies: 2/14>> CXR: No PNA  Significant microbiology data: None  Procedures: None  Consults: None  Subjective: Feels better-less shortness of breath as well as wheezing-continues to cough.  Although better-not yet at baseline.  Objective: Vitals: Blood pressure (!) 140/67, pulse (!) 126, temperature 99.2 F (37.3 C), temperature source Oral, resp. rate 20, height 5\' 4"  (1.626 m), weight 49 kg, SpO2 96%.   Exam: Gen Exam:Alert awake-not in any distress HEENT:atraumatic, normocephalic Chest: Moving air well-some scattered rhonchi. CVS:S1S2 regular Abdomen:soft non tender, non distended Extremities:no edema Neurology: Non focal Skin: no rash  Pertinent Labs/Radiology:    Latest Ref Rng & Units 10/17/2023    5:12 AM 10/16/2023   10:14 AM 10/16/2023   10:00 AM  CBC  WBC 4.0 - 10.5 K/uL 4.9   7.4   Hemoglobin 12.0 - 15.0 g/dL 56.2  13.0  86.5   Hematocrit 36.0 - 46.0 % 35.1  43.0  42.7   Platelets 150 - 400 K/uL 243   303     Lab Results  Component Value Date   NA 138 10/17/2023   K 4.1 10/17/2023   CL 104 10/17/2023   CO2 28 10/17/2023      Assessment/Plan: Acute on chronic hypoxic respiratory failure secondary to COPD exacerbation due to RSV infection Improving-titrated down to 3 L of oxygen today Taper IV Solu-Medrol Continue bronchodilators Stop Rocephin-Zithromax x 3 days.  HTN BP stable but slowly  creeping up Resume metoprolol  Hypothyroidism TSH suppressed-decrease metoprolol to 75 mcg-repeat TSH in 4-6 weeks at PCPs office  HLD Resume Repatha as an outpatient  History of TIA/CVA Stable Aspirin  Essential thrombocythemia Continue Hydrea Platelets stable  Underweight: Estimated body mass index is 18.54 kg/m as calculated from the following:   Height as of this encounter: 5\' 4"  (1.626 m).   Weight as of this encounter: 49 kg.   Code status:   Code Status: Full Code   DVT Prophylaxis: enoxaparin (LOVENOX) injection 30 mg Start: 10/16/23 1400   Family Communication: None at bedside   Disposition Plan: Status is: Inpatient Remains inpatient appropriate because: Severity of illness   Planned Discharge Destination:Home   Diet: Diet Order             Diet regular Room service appropriate? Yes; Fluid consistency: Thin  Diet effective now                     Antimicrobial agents: Anti-infectives (From admission, onward)    Start     Dose/Rate Route Frequency Ordered Stop   10/17/23 1200  cefTRIAXone (ROCEPHIN) 1 g in sodium chloride 0.9 % 100 mL IVPB        1 g 200 mL/hr over 30 Minutes Intravenous Every 24 hours 10/16/23 1327 10/22/23 1159   10/16/23 1215  cefTRIAXone (ROCEPHIN) 1 g in sodium chloride 0.9 %  100 mL IVPB        1 g 200 mL/hr over 30 Minutes Intravenous  Once 10/16/23 1209 10/16/23 1247        MEDICATIONS: Scheduled Meds:  amitriptyline  50 mg Oral QHS   arformoterol  15 mcg Nebulization BID   [START ON 10/24/2023] aspirin EC  81 mg Oral Daily   budesonide (PULMICORT) nebulizer solution  0.25 mg Nebulization BID   cyclobenzaprine  5 mg Oral BID   enoxaparin (LOVENOX) injection  30 mg Subcutaneous Q24H   feeding supplement  237 mL Oral BID BM   guaiFENesin  600 mg Oral BID   hydroxyurea  500 mg Oral QODAY   ipratropium-albuterol  3 mL Nebulization TID   levothyroxine  88 mcg Oral q morning   methylPREDNISolone (SOLU-MEDROL)  injection  40 mg Intravenous Q12H   multivitamin with minerals  1 tablet Oral Daily   revefenacin  175 mcg Nebulization Daily   roflumilast  500 mcg Oral Daily   Continuous Infusions:  cefTRIAXone (ROCEPHIN)  IV     PRN Meds:.acetaminophen **OR** acetaminophen, albuterol   I have personally reviewed following labs and imaging studies  LABORATORY DATA: CBC: Recent Labs  Lab 10/12/23 1410 10/16/23 1000 10/16/23 1014 10/17/23 0512  WBC 6.3 7.4  --  4.9  NEUTROABS 5.1 5.6  --   --   HGB 13.2 13.8 14.6 11.2*  HCT 41.1 42.7 43.0 35.1*  MCV 97.2 96.6  --  97.8  PLT 301 303  --  243    Basic Metabolic Panel: Recent Labs  Lab 10/12/23 1410 10/16/23 1000 10/16/23 1014 10/17/23 0512  NA 141 137 136 138  K 4.9 3.4* 3.3* 4.1  CL 102 95*  --  104  CO2 32 27  --  28  GLUCOSE 154* 162*  --  109*  BUN 16 13  --  6*  CREATININE 1.00 0.93  --  0.79  CALCIUM 9.9 8.8*  --  7.6*  MG  --  1.9  --   --     GFR: Estimated Creatinine Clearance: 51.3 mL/min (by C-G formula based on SCr of 0.79 mg/dL).  Liver Function Tests: Recent Labs  Lab 10/12/23 1410 10/16/23 1000  AST 20 26  ALT 10 15  ALKPHOS 49 48  BILITOT 0.4 0.5  PROT 6.8 6.6  ALBUMIN 4.1 3.6   No results for input(s): "LIPASE", "AMYLASE" in the last 168 hours. No results for input(s): "AMMONIA" in the last 168 hours.  Coagulation Profile: No results for input(s): "INR", "PROTIME" in the last 168 hours.  Cardiac Enzymes: No results for input(s): "CKTOTAL", "CKMB", "CKMBINDEX", "TROPONINI" in the last 168 hours.  BNP (last 3 results) No results for input(s): "PROBNP" in the last 8760 hours.  Lipid Profile: No results for input(s): "CHOL", "HDL", "LDLCALC", "TRIG", "CHOLHDL", "LDLDIRECT" in the last 72 hours.  Thyroid Function Tests: Recent Labs    10/16/23 1000 10/16/23 1700  TSH 0.099*  --   FREET4  --  1.48*    Anemia Panel: No results for input(s): "VITAMINB12", "FOLATE", "FERRITIN", "TIBC",  "IRON", "RETICCTPCT" in the last 72 hours.  Urine analysis:    Component Value Date/Time   COLORURINE YELLOW 08/21/2020 1822   APPEARANCEUR HAZY (A) 08/21/2020 1822   LABSPEC 1.013 08/21/2020 1822   PHURINE 6.0 08/21/2020 1822   GLUCOSEU 150 (A) 08/21/2020 1822   HGBUR SMALL (A) 08/21/2020 1822   BILIRUBINUR NEGATIVE 08/21/2020 1822   KETONESUR 5 (A) 08/21/2020 1822  PROTEINUR 100 (A) 08/21/2020 1822   UROBILINOGEN 1.0 09/20/2013 1621   NITRITE NEGATIVE 08/21/2020 1822   LEUKOCYTESUR NEGATIVE 08/21/2020 1822    Sepsis Labs: Lactic Acid, Venous    Component Value Date/Time   LATICACIDVEN 1.1 08/22/2020 0038    MICROBIOLOGY: Recent Results (from the past 240 hours)  Resp panel by RT-PCR (RSV, Flu A&B, Covid) Anterior Nasal Swab     Status: Abnormal   Collection Time: 10/14/23 11:46 AM   Specimen: Anterior Nasal Swab  Result Value Ref Range Status   SARS Coronavirus 2 by RT PCR NEGATIVE NEGATIVE Final    Comment: (NOTE) SARS-CoV-2 target nucleic acids are NOT DETECTED.  The SARS-CoV-2 RNA is generally detectable in upper respiratory specimens during the acute phase of infection. The lowest concentration of SARS-CoV-2 viral copies this assay can detect is 138 copies/mL. A negative result does not preclude SARS-Cov-2 infection and should not be used as the sole basis for treatment or other patient management decisions. A negative result may occur with  improper specimen collection/handling, submission of specimen other than nasopharyngeal swab, presence of viral mutation(s) within the areas targeted by this assay, and inadequate number of viral copies(<138 copies/mL). A negative result must be combined with clinical observations, patient history, and epidemiological information. The expected result is Negative.  Fact Sheet for Patients:  BloggerCourse.com  Fact Sheet for Healthcare Providers:  SeriousBroker.it  This  test is no t yet approved or cleared by the [place] FDA and  has been authorized for detection and/or diagnosis of SARS-CoV-2 by FDA under an Emergency Use Authorization (EUA). This EUA will remain  in effect (meaning this test can be used) for the duration of the COVID-19 declaration under Section 564(b)(1) of the Act, 21 U.S.C.section 360bbb-3(b)(1), unless the authorization is terminated  or revoked sooner.       Influenza A by PCR NEGATIVE NEGATIVE Final   Influenza B by PCR NEGATIVE NEGATIVE Final    Comment: (NOTE) The Xpert Xpress SARS-CoV-2/FLU/RSV plus assay is intended as an aid in the diagnosis of influenza from Nasopharyngeal swab specimens and should not be used as a sole basis for treatment. Nasal washings and aspirates are unacceptable for Xpert Xpress SARS-CoV-2/FLU/RSV testing.  Fact Sheet for Patients: BloggerCourse.com  Fact Sheet for Healthcare Providers: SeriousBroker.it  This test is not yet approved or cleared by the [place] FDA and has been authorized for detection and/or diagnosis of SARS-CoV-2 by FDA under an Emergency Use Authorization (EUA). This EUA will remain in effect (meaning this test can be used) for the duration of the COVID-19 declaration under Section 564(b)(1) of the Act, 21 U.S.C. section 360bbb-3(b)(1), unless the authorization is terminated or revoked.     Resp Syncytial Virus by PCR POSITIVE (A) NEGATIVE Final    Comment: (NOTE) Fact Sheet for Patients: BloggerCourse.com  Fact Sheet for Healthcare Providers: SeriousBroker.it  This test is not yet approved or cleared by the [place] FDA and has been authorized for detection and/or diagnosis of SARS-CoV-2 by FDA under an Emergency Use Authorization (EUA). This EUA will remain in effect (meaning this test can be used) for the duration of the COVID-19 declaration under  Section 564(b)(1) of the Act, 21 U.S.C. section 360bbb-3(b)(1), unless the authorization is terminated or revoked.  Performed at [facility], [address] [place], [place] [address]     RADIOLOGY STUDIES/RESULTS: DG Chest Portable 1 View Result Date: 10/16/2023 CLINICAL DATA:  Cough. EXAM: PORTABLE CHEST 1 VIEW COMPARISON:  10/14/2023. FINDINGS: Bilateral lungs appear hyperexpanded and hyperlucent with coarse bronchovascular markings, in keeping with COPD. There is stable scarring overlying the right lung apex. Bilateral lungs otherwise appear clear. No dense consolidation or lung collapse. Apparent blunting of right lateral costophrenic angle is similar to the prior study and favored to represent pleural thickening. Left lateral costophrenic angle is clear. Normal cardio-mediastinal silhouette. No acute osseous abnormalities. The soft tissues are within normal limits. IMPRESSION: *No acute cardiopulmonary abnormality.  COPD. Electronically Signed   By: Jules Schick M.D.   On: 10/16/2023 11:28     LOS: 1 day   Jeoffrey Massed, MD  [facility]    To contact the attending provider between 7A-7P or the covering provider during after hours 7P-7A, please log into the web site [email or web address] and access using universal [place] password for that web site. If you do not have the password, please call the hospital operator.  10/17/2023, 9:48 AM

## 2023-10-18 DIAGNOSIS — R000 Tachycardia, unspecified: Secondary | ICD-10-CM | POA: Diagnosis not present

## 2023-10-18 DIAGNOSIS — I10 Essential (primary) hypertension: Secondary | ICD-10-CM | POA: Diagnosis not present

## 2023-10-18 DIAGNOSIS — J9621 Acute and chronic respiratory failure with hypoxia: Secondary | ICD-10-CM | POA: Diagnosis not present

## 2023-10-18 DIAGNOSIS — E039 Hypothyroidism, unspecified: Secondary | ICD-10-CM | POA: Diagnosis not present

## 2023-10-18 LAB — GLUCOSE, CAPILLARY
Glucose-Capillary: 183 mg/dL — ABNORMAL HIGH (ref 70–99)
Glucose-Capillary: 232 mg/dL — ABNORMAL HIGH (ref 70–99)

## 2023-10-18 LAB — EXPECTORATED SPUTUM ASSESSMENT W GRAM STAIN, RFLX TO RESP C

## 2023-10-18 MED ORDER — PREDNISONE 20 MG PO TABS
40.0000 mg | ORAL_TABLET | Freq: Every day | ORAL | Status: DC
  Administered 2023-10-18 – 2023-10-20 (×3): 40 mg via ORAL
  Filled 2023-10-18 (×3): qty 2

## 2023-10-18 MED ORDER — GUAIFENESIN-DM 100-10 MG/5ML PO SYRP
5.0000 mL | ORAL_SOLUTION | ORAL | Status: DC | PRN
  Administered 2023-10-18 – 2023-10-20 (×5): 5 mL via ORAL
  Filled 2023-10-18 (×5): qty 5

## 2023-10-18 MED ORDER — FUROSEMIDE 10 MG/ML IJ SOLN
20.0000 mg | Freq: Once | INTRAMUSCULAR | Status: AC
  Administered 2023-10-18: 20 mg via INTRAVENOUS
  Filled 2023-10-18: qty 2

## 2023-10-18 NOTE — Progress Notes (Signed)
 PROGRESS NOTE        PATIENT DETAILS Name: Cheryl Haley Age: 68 y.o. Sex: female Date of Birth: 1955-10-05 Admit Date: 10/16/2023 Admitting Physician Orland Mustard, MD [id number], [facility]  Brief Summary: Patient is a 68 y.o.  female with history of COPD on 3 L of oxygen at home,, HLD-who was diagnosed with RSV on 2/12-presented with shortness of breath-found to have COPD exacerbation and subsequently admitted to the hospitalist service.  Significant events: 2/14>> admit to [facility]-COPD exacerbation  Significant studies: 2/14>> CXR: No PNA  Significant microbiology data: None  Procedures: None  Consults: None  Subjective: Overall slowly improving-some shortness of breath when she walks-otherwise continues to slowly improve.  No nausea or vomiting overnight.  Objective: Vitals: Blood pressure 120/62, pulse 95, temperature 97.8 F (36.6 C), temperature source Oral, resp. rate (!) 30, height 5\' 4"  (1.626 m), weight 49 kg, SpO2 96%.   Exam: Gen Exam:Alert awake-not in any distress HEENT:atraumatic, normocephalic Chest: Scattered rhonchi-moving air well. CVS:S1S2 regular Abdomen:soft non tender, non distended Extremities:no edema Neurology: Non focal Skin: no rash  Pertinent Labs/Radiology:    Latest Ref Rng & Units 10/17/2023    5:12 AM 10/16/2023   10:14 AM 10/16/2023   10:00 AM  CBC  WBC 4.0 - 10.5 K/uL 4.9   7.4   Hemoglobin 12.0 - 15.0 g/dL 08.6  57.8  46.9   Hematocrit 36.0 - 46.0 % 35.1  43.0  42.7   Platelets 150 - 400 K/uL 243   303     Lab Results  Component Value Date   NA 138 10/17/2023   K 4.1 10/17/2023   CL 104 10/17/2023   CO2 28 10/17/2023      Assessment/Plan: Acute on chronic hypoxic respiratory failure secondary to COPD exacerbation due to RSV infection Slow improvement-not yet back to baseline Still some wheezing-still with a bit more exertional dyspnea than her usual baseline.   1 dose of IV  Lasix today to ensure negative balance-although no signs of any volume overload Transition Solu-Medrol to prednisone Continue bronchodilators Zithromax x 3 days.  HTN BP stable Continue metoprolol.  Hypothyroidism TSH suppressed-decrease Synthroid to 75 mcg-repeat TSH in 4-6 weeks at PCPs office  HLD Resume Repatha as an outpatient  History of TIA/CVA Stable Aspirin  Essential thrombocythemia Continue Hydrea Platelets stable  Underweight: Estimated body mass index is 18.54 kg/m as calculated from the following:   Height as of this encounter: 5\' 4"  (1.626 m).   Weight as of this encounter: 49 kg.   Code status:   Code Status: Full Code   DVT Prophylaxis: enoxaparin (LOVENOX) injection 30 mg Start: 10/16/23 1400   Family Communication: None at bedside   Disposition Plan: Status is: Inpatient Remains inpatient appropriate because: Severity of illness   Planned Discharge Destination:Home   Diet: Diet Order             Diet regular Room service appropriate? Yes; Fluid consistency: Thin  Diet effective now                     Antimicrobial agents: Anti-infectives (From admission, onward)    Start     Dose/Rate Route Frequency Ordered Stop   10/17/23 1200  cefTRIAXone (ROCEPHIN) 1 g in sodium chloride 0.9 % 100 mL IVPB  Status:  Discontinued  1 g 200 mL/hr over 30 Minutes Intravenous Every 24 hours 10/16/23 1327 10/18/23 0659   10/17/23 1045  azithromycin (ZITHROMAX) tablet 500 mg        500 mg Oral Daily 10/17/23 0956 10/20/23 0959   10/16/23 1215  cefTRIAXone (ROCEPHIN) 1 g in sodium chloride 0.9 % 100 mL IVPB        1 g 200 mL/hr over 30 Minutes Intravenous  Once 10/16/23 1209 10/16/23 1247        MEDICATIONS: Scheduled Meds:  amitriptyline  50 mg Oral QHS   arformoterol  15 mcg Nebulization BID   [START ON 10/24/2023] aspirin EC  81 mg Oral Daily   azithromycin  500 mg Oral Daily   budesonide (PULMICORT) nebulizer solution  0.25 mg  Nebulization BID   cyclobenzaprine  5 mg Oral BID   enoxaparin (LOVENOX) injection  30 mg Subcutaneous Q24H   feeding supplement  237 mL Oral BID BM   guaiFENesin  600 mg Oral BID   hydroxyurea  500 mg Oral QODAY   ipratropium-albuterol  3 mL Nebulization TID   levothyroxine  75 mcg Oral q morning   methylPREDNISolone (SOLU-MEDROL) injection  40 mg Intravenous Q12H   metoprolol succinate  25 mg Oral Daily   multivitamin with minerals  1 tablet Oral Daily   revefenacin  175 mcg Nebulization Daily   roflumilast  500 mcg Oral Daily   Continuous Infusions:   PRN Meds:.acetaminophen **OR** acetaminophen, albuterol   I have personally reviewed following labs and imaging studies  LABORATORY DATA: CBC: Recent Labs  Lab 10/12/23 1410 10/16/23 1000 10/16/23 1014 10/17/23 0512  WBC 6.3 7.4  --  4.9  NEUTROABS 5.1 5.6  --   --   HGB 13.2 13.8 14.6 11.2*  HCT 41.1 42.7 43.0 35.1*  MCV 97.2 96.6  --  97.8  PLT 301 303  --  243    Basic Metabolic Panel: Recent Labs  Lab 10/12/23 1410 10/16/23 1000 10/16/23 1014 10/17/23 0512  NA 141 137 136 138  K 4.9 3.4* 3.3* 4.1  CL 102 95*  --  104  CO2 32 27  --  28  GLUCOSE 154* 162*  --  109*  BUN 16 13  --  6*  CREATININE 1.00 0.93  --  0.79  CALCIUM 9.9 8.8*  --  7.6*  MG  --  1.9  --   --     GFR: Estimated Creatinine Clearance: 51.3 mL/min (by C-G formula based on SCr of 0.79 mg/dL).  Liver Function Tests: Recent Labs  Lab 10/12/23 1410 10/16/23 1000  AST 20 26  ALT 10 15  ALKPHOS 49 48  BILITOT 0.4 0.5  PROT 6.8 6.6  ALBUMIN 4.1 3.6   No results for input(s): "LIPASE", "AMYLASE" in the last 168 hours. No results for input(s): "AMMONIA" in the last 168 hours.  Coagulation Profile: No results for input(s): "INR", "PROTIME" in the last 168 hours.  Cardiac Enzymes: No results for input(s): "CKTOTAL", "CKMB", "CKMBINDEX", "TROPONINI" in the last 168 hours.  BNP (last 3 results) No results for input(s): "PROBNP"  in the last 8760 hours.  Lipid Profile: No results for input(s): "CHOL", "HDL", "LDLCALC", "TRIG", "CHOLHDL", "LDLDIRECT" in the last 72 hours.  Thyroid Function Tests: Recent Labs    10/16/23 1000 10/16/23 1700  TSH 0.099*  --   FREET4  --  1.48*    Anemia Panel: No results for input(s): "VITAMINB12", "FOLATE", "FERRITIN", "TIBC", "IRON", "RETICCTPCT" in the last 72  hours.  Urine analysis:    Component Value Date/Time   COLORURINE YELLOW 08/21/2020 1822   APPEARANCEUR HAZY (A) 08/21/2020 1822   LABSPEC 1.013 08/21/2020 1822   PHURINE 6.0 08/21/2020 1822   GLUCOSEU 150 (A) 08/21/2020 1822   HGBUR SMALL (A) 08/21/2020 1822   BILIRUBINUR NEGATIVE 08/21/2020 1822   KETONESUR 5 (A) 08/21/2020 1822   PROTEINUR 100 (A) 08/21/2020 1822   UROBILINOGEN 1.0 09/20/2013 1621   NITRITE NEGATIVE 08/21/2020 1822   LEUKOCYTESUR NEGATIVE 08/21/2020 1822    Sepsis Labs: Lactic Acid, Venous    Component Value Date/Time   LATICACIDVEN 1.1 08/22/2020 0038    MICROBIOLOGY: Recent Results (from the past 240 hours)  Resp panel by RT-PCR (RSV, Flu A&B, Covid) Anterior Nasal Swab     Status: Abnormal   Collection Time: 10/14/23 11:46 AM   Specimen: Anterior Nasal Swab  Result Value Ref Range Status   SARS Coronavirus 2 by RT PCR NEGATIVE NEGATIVE Final    Comment: (NOTE) SARS-CoV-2 target nucleic acids are NOT DETECTED.  The SARS-CoV-2 RNA is generally detectable in upper respiratory specimens during the acute phase of infection. The lowest concentration of SARS-CoV-2 viral copies this assay can detect is 138 copies/mL. A negative result does not preclude SARS-Cov-2 infection and should not be used as the sole basis for treatment or other patient management decisions. A negative result may occur with  improper specimen collection/handling, submission of specimen other than nasopharyngeal swab, presence of viral mutation(s) within the areas targeted by this assay, and inadequate  number of viral copies(<138 copies/mL). A negative result must be combined with clinical observations, patient history, and epidemiological information. The expected result is Negative.  Fact Sheet for Patients:  BloggerCourse.com  Fact Sheet for Healthcare Providers:  SeriousBroker.it  This test is no t yet approved or cleared by the [place] FDA and  has been authorized for detection and/or diagnosis of SARS-CoV-2 by FDA under an Emergency Use Authorization (EUA). This EUA will remain  in effect (meaning this test can be used) for the duration of the COVID-19 declaration under Section 564(b)(1) of the Act, 21 U.S.C.section 360bbb-3(b)(1), unless the authorization is terminated  or revoked sooner.       Influenza A by PCR NEGATIVE NEGATIVE Final   Influenza B by PCR NEGATIVE NEGATIVE Final    Comment: (NOTE) The Xpert Xpress SARS-CoV-2/FLU/RSV plus assay is intended as an aid in the diagnosis of influenza from Nasopharyngeal swab specimens and should not be used as a sole basis for treatment. Nasal washings and aspirates are unacceptable for Xpert Xpress SARS-CoV-2/FLU/RSV testing.  Fact Sheet for Patients: BloggerCourse.com  Fact Sheet for Healthcare Providers: SeriousBroker.it  This test is not yet approved or cleared by the [place] FDA and has been authorized for detection and/or diagnosis of SARS-CoV-2 by FDA under an Emergency Use Authorization (EUA). This EUA will remain in effect (meaning this test can be used) for the duration of the COVID-19 declaration under Section 564(b)(1) of the Act, 21 U.S.C. section 360bbb-3(b)(1), unless the authorization is terminated or revoked.     Resp Syncytial Virus by PCR POSITIVE (A) NEGATIVE Final    Comment: (NOTE) Fact Sheet for Patients: BloggerCourse.com  Fact Sheet for Healthcare  Providers: SeriousBroker.it  This test is not yet approved or cleared by the [place] FDA and has been authorized for detection and/or diagnosis of SARS-CoV-2 by FDA under an Emergency Use Authorization (EUA). This EUA will remain in effect (meaning this test can be used)  for the duration of the COVID-19 declaration under Section 564(b)(1) of the Act, 21 U.S.C. section 360bbb-3(b)(1), unless the authorization is terminated or revoked.  Performed at [facility], [address] [place], [place] [address]     RADIOLOGY STUDIES/RESULTS: DG Chest Portable 1 View Result Date: 10/16/2023 CLINICAL DATA:  Cough. EXAM: PORTABLE CHEST 1 VIEW COMPARISON:  10/14/2023. FINDINGS: Bilateral lungs appear hyperexpanded and hyperlucent with coarse bronchovascular markings, in keeping with COPD. There is stable scarring overlying the right lung apex. Bilateral lungs otherwise appear clear. No dense consolidation or lung collapse. Apparent blunting of right lateral costophrenic angle is similar to the prior study and favored to represent pleural thickening. Left lateral costophrenic angle is clear. Normal cardio-mediastinal silhouette. No acute osseous abnormalities. The soft tissues are within normal limits. IMPRESSION: *No acute cardiopulmonary abnormality.  COPD. Electronically Signed   By: Jules Schick M.D.   On: 10/16/2023 11:28     LOS: 2 days   Jeoffrey Massed, MD  [facility]    To contact the attending provider between 7A-7P or the covering provider during after hours 7P-7A, please log into the web site [email or web address] and access using universal [place] password for that web site. If you do not have the password, please call the hospital operator.  10/18/2023, 9:00 AM

## 2023-10-18 NOTE — Plan of Care (Signed)

## 2023-10-18 NOTE — Plan of Care (Signed)
  Problem: Health Behavior/Discharge Planning: Goal: Ability to manage health-related needs will improve Outcome: Progressing   Problem: Clinical Measurements: Goal: Ability to maintain clinical measurements within normal limits will improve Outcome: Progressing Goal: Will remain free from infection Outcome: Progressing Goal: Respiratory complications will improve Outcome: Progressing   Problem: Activity: Goal: Risk for activity intolerance will decrease Outcome: Progressing   Problem: Coping: Goal: Level of anxiety will decrease Outcome: Progressing

## 2023-10-19 DIAGNOSIS — J9621 Acute and chronic respiratory failure with hypoxia: Secondary | ICD-10-CM | POA: Diagnosis not present

## 2023-10-19 DIAGNOSIS — E039 Hypothyroidism, unspecified: Secondary | ICD-10-CM | POA: Diagnosis not present

## 2023-10-19 DIAGNOSIS — R000 Tachycardia, unspecified: Secondary | ICD-10-CM | POA: Diagnosis not present

## 2023-10-19 DIAGNOSIS — I10 Essential (primary) hypertension: Secondary | ICD-10-CM | POA: Diagnosis not present

## 2023-10-19 LAB — GLUCOSE, CAPILLARY: Glucose-Capillary: 152 mg/dL — ABNORMAL HIGH (ref 70–99)

## 2023-10-19 LAB — BASIC METABOLIC PANEL
Anion gap: 7 (ref 5–15)
BUN: 15 mg/dL (ref 8–23)
CO2: 38 mmol/L — ABNORMAL HIGH (ref 22–32)
Calcium: 8.6 mg/dL — ABNORMAL LOW (ref 8.9–10.3)
Chloride: 94 mmol/L — ABNORMAL LOW (ref 98–111)
Creatinine, Ser: 0.59 mg/dL (ref 0.44–1.00)
GFR, Estimated: >60 mL/min (ref >60)
Glucose, Bld: 104 mg/dL — ABNORMAL HIGH (ref 70–99)
Potassium: 4.1 mmol/L (ref 3.5–5.1)
Sodium: 139 mmol/L (ref 135–145)

## 2023-10-19 MED ORDER — ASPIRIN 81 MG PO TBEC
81.0000 mg | DELAYED_RELEASE_TABLET | Freq: Every day | ORAL | Status: DC
  Administered 2023-10-19 – 2023-10-20 (×2): 81 mg via ORAL
  Filled 2023-10-19 (×2): qty 1

## 2023-10-19 NOTE — Evaluation (Signed)
 Physical Therapy Evaluation Patient Details Name: Cheryl Haley MRN: [id number] DOB: June 03, 1956 Today's Date: 10/19/2023  History of Present Illness  The pt is a 68 yo female presenting 2/14 with SOB, work up revealed RSV+ with hypoxia. PMH includes: COPD on 3L, DM II, TIA, HTN, HLD, thyroid disease, and tobacco abuse.   Clinical Impression  Pt in bed upon arrival of PT, agreeable to evaluation at this time. Prior to admission the pt was using 3-wheel walker in the home, living with her sister and brother in law who assist with IADLs. The pt is typically able to mobilize in the house without physical assistance, but now presents with limitations in functional mobility, strength, endurance, and activity tolerance due to above dx, and will continue to benefit from skilled PT to address these deficits. She presents with significant anxiety with all movement, and benefits from increased time, seated rest, and cues for PLB. The pt was on 3L initially, required 4L sitting EOB, and up to 6L to recover after standing attempt. Pt with HR to 138bpm with standing and SpO2 to 84% on 4L. She required >3 min of seated rest with cues for breathing for HR to return to 120s and SpO2 to approach 90%. Continue to recommend skilled PT acutely and after d/c to further progress independence with mobility and endurance for house-hold distances, but anticipate pt will be able to progress back home with family support.          If plan is discharge home, recommend the following: A little help with walking and/or transfers;A little help with bathing/dressing/bathroom;Assistance with cooking/housework;Assist for transportation;Help with stairs or ramp for entrance   Can travel by private vehicle        Equipment Recommendations None recommended by PT  Recommendations for Other Services       Functional Status Assessment Patient has had a recent decline in their functional status and demonstrates the ability to make  significant improvements in function in a reasonable and predictable amount of time.     Precautions / Restrictions Precautions Precautions: Fall Recall of Precautions/Restrictions: Intact Restrictions Weight Bearing Restrictions Per Provider Order: No      Mobility  Bed Mobility Overal bed mobility: Needs Assistance Bed Mobility: Supine to Sit     Supine to sit: Supervision     General bed mobility comments: supervision with pt needing increased time    Transfers Overall transfer level: Needs assistance Equipment used: Rollator (4 wheels), None Transfers: Sit to/from Stand, Bed to chair/wheelchair/BSC Sit to Stand: Contact guard assist   Step pivot transfers: Contact guard assist       General transfer comment: CGA as pt is impulsive, but poor standing tolerance and had to sit after ~5 seconds. no overt LOB or buckling. able to pivot to recliner without DME or assist but poor eccentric controll    Ambulation/Gait               General Gait Details: pt unable to to anxiety and SOB, HR 138bpm in standing and SpO2 to 84% on 4L, increased to 6L and encouraged PLB with seated rest     Balance Overall balance assessment: Needs assistance Sitting-balance support: No upper extremity supported Sitting balance-Leahy Scale: Good     Standing balance support: No upper extremity supported Standing balance-Leahy Scale: Fair Standing balance comment: poor tolerance for challenge  Pertinent Vitals/Pain Pain Assessment Pain Assessment: No/denies pain    Home Living Family/patient expects to be discharged to:: Private residence Living Arrangements: Other relatives (sister and brother in law) Available Help at Discharge: Family;Available 24 hours/day Type of Home: House Home Access: Stairs to enter Entrance Stairs-Rails: Right Entrance Stairs-Number of Steps: 3   Home Layout: One level Home Equipment: Agricultural consultant (2  wheels);Shower seat;Rollator (4 wheels);Hand held shower head (oxygen)      Prior Function Prior Level of Function : Driving;Needs assist       Physical Assist : ADLs (physical)   ADLs (physical): Dressing;Bathing;IADLs Mobility Comments: pt uses 3-wheel walker in the house, rollator in community. no falls ADLs Comments: sister and brother in law do IADLs     Extremity/Trunk Assessment   Upper Extremity Assessment Upper Extremity Assessment: Generalized weakness (progressive tremors with anxiety and SOB)    Lower Extremity Assessment Lower Extremity Assessment: Generalized weakness (progressive tremors with MMT, fatigue, and SOB)    Cervical / Trunk Assessment Cervical / Trunk Assessment: Kyphotic;Other exceptions Cervical / Trunk Exceptions: frail  Communication   Communication Communication: No apparent difficulties    Cognition Arousal: Alert Behavior During Therapy: Anxious   PT - Cognitive impairments: No apparent impairments                       PT - Cognition Comments: pt with significant anxiety, attempted breathing strategies with little success Following commands: Intact       Cueing Cueing Techniques: Verbal cues     General Comments General comments (skin integrity, edema, etc.): HR to 138bpm when standing, pt with significant anxiety limiting movement. SpO2 to 84% with standing, increased to 6L with seated rest and cues for breathing    Exercises Other Exercises Other Exercises: incentive spirometer -   Assessment/Plan    PT Assessment Patient needs continued PT services  PT Problem List Cardiopulmonary status limiting activity;Decreased strength;Decreased activity tolerance;Decreased balance       PT Treatment Interventions DME instruction;Gait training;Stair training;Functional mobility training;Therapeutic activities;Therapeutic exercise;Balance training;Patient/family education    PT Goals (Current goals can be found in the  Care Plan section)  Acute Rehab PT Goals Patient Stated Goal: reduce anxiety PT Goal Formulation: With patient Time For Goal Achievement: 11/02/23 Potential to Achieve Goals: Fair    Frequency Min 1X/week        AM-PAC PT "6 Clicks" Mobility  Outcome Measure Help needed turning from your back to your side while in a flat bed without using bedrails?: None Help needed moving from lying on your back to sitting on the side of a flat bed without using bedrails?: A Little Help needed moving to and from a bed to a chair (including a wheelchair)?: A Little Help needed standing up from a chair using your arms (e.g., wheelchair or bedside chair)?: A Little Help needed to walk in hospital room?: Total (<20 ft) Help needed climbing 3-5 steps with a railing? : Total 6 Click Score: 15    End of Session Equipment Utilized During Treatment: Gait belt;Oxygen Activity Tolerance: Other (comment) (anxiety and SpO2) Patient left: in chair;with call bell/phone within reach Nurse Communication: Mobility status (anxiety) PT Visit Diagnosis: Unsteadiness on feet (R26.81);Muscle weakness (generalized) (M62.81)    Time: 4098-1191 PT Time Calculation (min) (ACUTE ONLY): 30 min   Charges:   PT Evaluation $PT Eval Moderate Complexity: 1 Mod PT Treatments $Therapeutic Activity: 8-22 mins PT General Charges $$ ACUTE PT VISIT: 1 Visit  Vickki Muff, PT, DPT   Acute Rehabilitation Department Office [phone] Secure Chat Communication Preferred  Ronnie Derby 10/19/2023, 12:08 PM

## 2023-10-19 NOTE — Progress Notes (Signed)
 PROGRESS NOTE        PATIENT DETAILS Name: Cheryl Haley Age: 68 y.o. Sex: female Date of Birth: Nov 01, 1955 Admit Date: 10/16/2023 Admitting Physician Orland Mustard, MD UEA:[id number], [facility]  Brief Summary: Patient is a 68 y.o.  female with history of COPD on 3 L of oxygen at home,, HLD-who was diagnosed with RSV on 2/12-presented with shortness of breath-found to have COPD exacerbation and subsequently admitted to the hospitalist service.  Significant events: 2/14>> admit to [facility]-COPD exacerbation  Significant studies: 2/14>> CXR: No PNA  Significant microbiology data: None  Procedures: None  Consults: None  Subjective: Uneventful night-stable on 3-4 L-slowly improving-has not ambulated yet.  Objective: Vitals: Blood pressure 121/72, pulse 93, temperature 97.6 F (36.4 C), temperature source Oral, resp. rate 15, height 5\' 4"  (1.626 m), weight 49 kg, SpO2 100%.   Exam: Gen Exam:Alert awake-not in any distress HEENT:atraumatic, normocephalic Chest: B/L clear to auscultation anteriorly CVS:S1S2 regular Abdomen:soft non tender, non distended Extremities:no edema Neurology: Non focal Skin: no rash  Pertinent Labs/Radiology:    Latest Ref Rng & Units 10/17/2023    5:12 AM 10/16/2023   10:14 AM 10/16/2023   10:00 AM  CBC  WBC 4.0 - 10.5 K/uL 4.9   7.4   Hemoglobin 12.0 - 15.0 g/dL 78.2  95.6  21.3   Hematocrit 36.0 - 46.0 % 35.1  43.0  42.7   Platelets 150 - 400 K/uL 243   303     Lab Results  Component Value Date   NA 139 10/19/2023   K 4.1 10/19/2023   CL 94 (L) 10/19/2023   CO2 38 (H) 10/19/2023      Assessment/Plan: Acute on chronic hypoxic respiratory failure secondary to COPD exacerbation due to RSV infection Slow improvement continues-lungs are mostly clear today-still complains of some exertional dyspnea-she has not yet ambulated-will ask nursing staff/physical therapy to see how she does with  ambulation. Continue to taper prednisone slowly Continue bronchodilators Zithromax x 3 days will be completed today.  HTN BP stable Continue metoprolol.  Hypothyroidism TSH suppressed-decreased Synthroid to 75 mcg-repeat TSH in 4-6 weeks at PCPs office  HLD Resume Repatha as an outpatient  History of TIA/CVA Stable Aspirin  Essential thrombocythemia Continue Hydrea Platelets stable  Underweight: Estimated body mass index is 18.54 kg/m as calculated from the following:   Height as of this encounter: 5\' 4"  (1.626 m).   Weight as of this encounter: 49 kg.   Code status:   Code Status: Full Code   DVT Prophylaxis: enoxaparin (LOVENOX) injection 30 mg Start: 10/16/23 1400   Family Communication: None at bedside   Disposition Plan: Status is: Inpatient Remains inpatient appropriate because: Severity of illness   Planned Discharge Destination:Home   Diet: Diet Order             Diet regular Room service appropriate? Yes; Fluid consistency: Thin  Diet effective now                     Antimicrobial agents: Anti-infectives (From admission, onward)    Start     Dose/Rate Route Frequency Ordered Stop   10/17/23 1200  cefTRIAXone (ROCEPHIN) 1 g in sodium chloride 0.9 % 100 mL IVPB  Status:  Discontinued        1 g 200 mL/hr over 30 Minutes Intravenous Every 24 hours 10/16/23 1327  10/18/23 0659   10/17/23 1045  azithromycin (ZITHROMAX) tablet 500 mg        500 mg Oral Daily 10/17/23 0956 10/20/23 0959   10/16/23 1215  cefTRIAXone (ROCEPHIN) 1 g in sodium chloride 0.9 % 100 mL IVPB        1 g 200 mL/hr over 30 Minutes Intravenous  Once 10/16/23 1209 10/16/23 1247        MEDICATIONS: Scheduled Meds:  amitriptyline  50 mg Oral QHS   arformoterol  15 mcg Nebulization BID   [START ON 10/24/2023] aspirin EC  81 mg Oral Daily   azithromycin  500 mg Oral Daily   budesonide (PULMICORT) nebulizer solution  0.25 mg Nebulization BID   cyclobenzaprine  5 mg  Oral BID   enoxaparin (LOVENOX) injection  30 mg Subcutaneous Q24H   feeding supplement  237 mL Oral BID BM   guaiFENesin  600 mg Oral BID   hydroxyurea  500 mg Oral QODAY   ipratropium-albuterol  3 mL Nebulization TID   levothyroxine  75 mcg Oral q morning   metoprolol succinate  25 mg Oral Daily   multivitamin with minerals  1 tablet Oral Daily   predniSONE  40 mg Oral Q breakfast   revefenacin  175 mcg Nebulization Daily   roflumilast  500 mcg Oral Daily   Continuous Infusions:   PRN Meds:.acetaminophen **OR** acetaminophen, albuterol, guaiFENesin-dextromethorphan   I have personally reviewed following labs and imaging studies  LABORATORY DATA: CBC: Recent Labs  Lab 10/12/23 1410 10/16/23 1000 10/16/23 1014 10/17/23 0512  WBC 6.3 7.4  --  4.9  NEUTROABS 5.1 5.6  --   --   HGB 13.2 13.8 14.6 11.2*  HCT 41.1 42.7 43.0 35.1*  MCV 97.2 96.6  --  97.8  PLT 301 303  --  243    Basic Metabolic Panel: Recent Labs  Lab 10/12/23 1410 10/16/23 1000 10/16/23 1014 10/17/23 0512 10/19/23 0416  NA 141 137 136 138 139  K 4.9 3.4* 3.3* 4.1 4.1  CL 102 95*  --  104 94*  CO2 32 27  --  28 38*  GLUCOSE 154* 162*  --  109* 104*  BUN 16 13  --  6* 15  CREATININE 1.00 0.93  --  0.79 0.59  CALCIUM 9.9 8.8*  --  7.6* 8.6*  MG  --  1.9  --   --   --     GFR: Estimated Creatinine Clearance: 51.3 mL/min (by C-G formula based on SCr of 0.59 mg/dL).  Liver Function Tests: Recent Labs  Lab 10/12/23 1410 10/16/23 1000  AST 20 26  ALT 10 15  ALKPHOS 49 48  BILITOT 0.4 0.5  PROT 6.8 6.6  ALBUMIN 4.1 3.6   No results for input(s): "LIPASE", "AMYLASE" in the last 168 hours. No results for input(s): "AMMONIA" in the last 168 hours.  Coagulation Profile: No results for input(s): "INR", "PROTIME" in the last 168 hours.  Cardiac Enzymes: No results for input(s): "CKTOTAL", "CKMB", "CKMBINDEX", "TROPONINI" in the last 168 hours.  BNP (last 3 results) No results for  input(s): "PROBNP" in the last 8760 hours.  Lipid Profile: No results for input(s): "CHOL", "HDL", "LDLCALC", "TRIG", "CHOLHDL", "LDLDIRECT" in the last 72 hours.  Thyroid Function Tests: Recent Labs    10/16/23 1000 10/16/23 1700  TSH 0.099*  --   FREET4  --  1.48*    Anemia Panel: No results for input(s): "VITAMINB12", "FOLATE", "FERRITIN", "TIBC", "IRON", "RETICCTPCT" in the last 72  hours.  Urine analysis:    Component Value Date/Time   COLORURINE YELLOW 08/21/2020 1822   APPEARANCEUR HAZY (A) 08/21/2020 1822   LABSPEC 1.013 08/21/2020 1822   PHURINE 6.0 08/21/2020 1822   GLUCOSEU 150 (A) 08/21/2020 1822   HGBUR SMALL (A) 08/21/2020 1822   BILIRUBINUR NEGATIVE 08/21/2020 1822   KETONESUR 5 (A) 08/21/2020 1822   PROTEINUR 100 (A) 08/21/2020 1822   UROBILINOGEN 1.0 09/20/2013 1621   NITRITE NEGATIVE 08/21/2020 1822   LEUKOCYTESUR NEGATIVE 08/21/2020 1822    Sepsis Labs: Lactic Acid, Venous    Component Value Date/Time   LATICACIDVEN 1.1 08/22/2020 0038    MICROBIOLOGY: Recent Results (from the past 240 hours)  Resp panel by RT-PCR (RSV, Flu A&B, Covid) Anterior Nasal Swab     Status: Abnormal   Collection Time: 10/14/23 11:46 AM   Specimen: Anterior Nasal Swab  Result Value Ref Range Status   SARS Coronavirus 2 by RT PCR NEGATIVE NEGATIVE Final    Comment: (NOTE) SARS-CoV-2 target nucleic acids are NOT DETECTED.  The SARS-CoV-2 RNA is generally detectable in upper respiratory specimens during the acute phase of infection. The lowest concentration of SARS-CoV-2 viral copies this assay can detect is 138 copies/mL. A negative result does not preclude SARS-Cov-2 infection and should not be used as the sole basis for treatment or other patient management decisions. A negative result may occur with  improper specimen collection/handling, submission of specimen other than nasopharyngeal swab, presence of viral mutation(s) within the areas targeted by this assay,  and inadequate number of viral copies(<138 copies/mL). A negative result must be combined with clinical observations, patient history, and epidemiological information. The expected result is Negative.  Fact Sheet for Patients:  BloggerCourse.com  Fact Sheet for Healthcare Providers:  SeriousBroker.it  This test is no t yet approved or cleared by the [place] FDA and  has been authorized for detection and/or diagnosis of SARS-CoV-2 by FDA under an Emergency Use Authorization (EUA). This EUA will remain  in effect (meaning this test can be used) for the duration of the COVID-19 declaration under Section 564(b)(1) of the Act, 21 U.S.C.section 360bbb-3(b)(1), unless the authorization is terminated  or revoked sooner.       Influenza A by PCR NEGATIVE NEGATIVE Final   Influenza B by PCR NEGATIVE NEGATIVE Final    Comment: (NOTE) The Xpert Xpress SARS-CoV-2/FLU/RSV plus assay is intended as an aid in the diagnosis of influenza from Nasopharyngeal swab specimens and should not be used as a sole basis for treatment. Nasal washings and aspirates are unacceptable for Xpert Xpress SARS-CoV-2/FLU/RSV testing.  Fact Sheet for Patients: BloggerCourse.com  Fact Sheet for Healthcare Providers: SeriousBroker.it  This test is not yet approved or cleared by the [place] FDA and has been authorized for detection and/or diagnosis of SARS-CoV-2 by FDA under an Emergency Use Authorization (EUA). This EUA will remain in effect (meaning this test can be used) for the duration of the COVID-19 declaration under Section 564(b)(1) of the Act, 21 U.S.C. section 360bbb-3(b)(1), unless the authorization is terminated or revoked.     Resp Syncytial Virus by PCR POSITIVE (A) NEGATIVE Final    Comment: (NOTE) Fact Sheet for Patients: BloggerCourse.com  Fact Sheet for  Healthcare Providers: SeriousBroker.it  This test is not yet approved or cleared by the [place] FDA and has been authorized for detection and/or diagnosis of SARS-CoV-2 by FDA under an Emergency Use Authorization (EUA). This EUA will remain in effect (meaning this test can be used)  for the duration of the COVID-19 declaration under Section 564(b)(1) of the Act, 21 U.S.C. section 360bbb-3(b)(1), unless the authorization is terminated or revoked.  Performed at [facility], [address] [place], [place] [address]   Expectorated Sputum Assessment w Gram Stain, Rflx to Resp Cult     Status: None   Collection Time: 10/18/23  6:57 PM   Specimen: Expectorated Sputum  Result Value Ref Range Status   Specimen Description EXPECTORATED SPUTUM  Final   Special Requests NONE  Final   Sputum evaluation   Final    THIS SPECIMEN IS ACCEPTABLE FOR SPUTUM CULTURE Performed at [facility], [address], [place], [place] [address]    Report Status 10/18/2023 FINAL  Final  Culture, Respiratory w Gram Stain     Status: None (Preliminary result)   Collection Time: 10/18/23  6:57 PM  Result Value Ref Range Status   Specimen Description EXPECTORATED SPUTUM  Final   Special Requests NONE Reflexed from [id number]  Final   Gram Stain   Final    FEW WBC SEEN RARE YEAST RARE GRAM NEGATIVE RODS Performed at [facility], [address], [place], [place] [address]    Culture PENDING  Incomplete   Report Status PENDING  Incomplete    RADIOLOGY STUDIES/RESULTS: No results found.    LOS: 3 days   Jeoffrey Massed, MD  [facility]    To contact the attending provider between 7A-7P or the covering provider during after hours 7P-7A, please log into the web site [email or web address] and access using universal Jay password for that web site. If you do not have the password, please call the hospital operator.  10/19/2023, 8:59 AM

## 2023-10-19 NOTE — Plan of Care (Signed)
  Problem: Health Behavior/Discharge Planning: Goal: Ability to manage health-related needs will improve Outcome: Progressing   Problem: Clinical Measurements: Goal: Ability to maintain clinical measurements within normal limits will improve Outcome: Progressing Goal: Will remain free from infection Outcome: Progressing Goal: Respiratory complications will improve Outcome: Progressing   Problem: Activity: Goal: Risk for activity intolerance will decrease Outcome: Progressing   Problem: Coping: Goal: Level of anxiety will decrease Outcome: Progressing   Problem: Safety: Goal: Ability to remain free from injury will improve Outcome: Progressing

## 2023-10-20 ENCOUNTER — Other Ambulatory Visit (HOSPITAL_COMMUNITY): Payer: Self-pay

## 2023-10-20 DIAGNOSIS — J441 Chronic obstructive pulmonary disease with (acute) exacerbation: Secondary | ICD-10-CM

## 2023-10-20 DIAGNOSIS — E785 Hyperlipidemia, unspecified: Secondary | ICD-10-CM | POA: Diagnosis not present

## 2023-10-20 DIAGNOSIS — J9621 Acute and chronic respiratory failure with hypoxia: Secondary | ICD-10-CM | POA: Diagnosis not present

## 2023-10-20 DIAGNOSIS — J449 Chronic obstructive pulmonary disease, unspecified: Secondary | ICD-10-CM | POA: Diagnosis not present

## 2023-10-20 MED ORDER — BENZONATATE 100 MG PO CAPS
100.0000 mg | ORAL_CAPSULE | Freq: Three times a day (TID) | ORAL | 0 refills | Status: DC | PRN
  Filled 2023-10-20: qty 30, 10d supply, fill #0

## 2023-10-20 MED ORDER — HYDROXYZINE HCL 25 MG PO TABS
25.0000 mg | ORAL_TABLET | Freq: Four times a day (QID) | ORAL | Status: DC | PRN
  Administered 2023-10-20: 25 mg via ORAL
  Filled 2023-10-20: qty 1

## 2023-10-20 MED ORDER — HYDROXYZINE HCL 25 MG PO TABS
25.0000 mg | ORAL_TABLET | Freq: Four times a day (QID) | ORAL | 0 refills | Status: DC | PRN
Start: 2023-10-20 — End: 2023-11-25
  Filled 2023-10-20: qty 15, 30d supply, fill #0

## 2023-10-20 MED ORDER — LEVOTHYROXINE SODIUM 75 MCG PO TABS
75.0000 ug | ORAL_TABLET | Freq: Every morning | ORAL | 1 refills | Status: AC
  Filled 2023-10-20: qty 30, 30d supply, fill #0

## 2023-10-20 MED ORDER — BENZONATATE 100 MG PO CAPS
100.0000 mg | ORAL_CAPSULE | Freq: Three times a day (TID) | ORAL | 0 refills | Status: DC
  Filled 2023-10-20: qty 21, 7d supply, fill #0

## 2023-10-20 MED ORDER — PREDNISONE 5 MG PO TABS
ORAL_TABLET | ORAL | 1 refills | Status: DC
  Filled 2023-10-20: qty 90, 58d supply, fill #0

## 2023-10-20 NOTE — Discharge Summary (Signed)
 PATIENT DETAILS Name: Cheryl Haley Age: 68 y.o. Sex: female Date of Birth: 30-Jun-1956 MRN: [id number]. Admitting Physician: Orland Mustard, MD WUJ:[id number], [facility]  Admit Date: 10/16/2023 Discharge date: 10/20/2023  Recommendations for Outpatient Follow-up:  Follow up with PCP in 1-2 weeks Please obtain CMP/CBC in one week Repeat TSH in 4-6 weeks.  Admitted From:  Home  Disposition: Home health   Discharge Condition: good  CODE STATUS:   Code Status: Full Code   Diet recommendation:  Diet Order             Diet - low sodium heart healthy           Diet regular Room service appropriate? Yes; Fluid consistency: Thin  Diet effective now                    Brief Summary: Patient is a 68 y.o.  female with history of COPD on 3 L of oxygen at home,, HLD-who was diagnosed with RSV on 2/12-presented with shortness of breath-found to have COPD exacerbation and subsequently admitted to the hospitalist service.   Significant events: 2/14>> admit to [facility]-COPD exacerbation   Significant studies: 2/14>> CXR: No PNA   Significant microbiology data: None   Procedures: None   Consults: None  Brief Hospital Course: Acute on chronic hypoxic respiratory failure secondary to COPD exacerbation due to RSV infection Overall much improved-hardly any wheezing today-lungs are clear-she feels much better-although acknowledges some issues with anxiety-"it is all in my head doc". Completed empiric Zithromax Continue bronchodilators Continue prednisone taper on discharge Back on usual 2-3 L of oxygen at home-which will be continued on discharge. As needed Vistaril for anxiety.   HTN BP stable Continue metoprolol.   Hypothyroidism TSH suppressed-decreased Synthroid to 75 mcg-repeat TSH in 4-6 weeks at PCPs office   HLD Resume Repatha as an outpatient   History of TIA/CVA Stable Aspirin   Essential thrombocythemia Continue Hydrea Platelets stable    Underweight: Estimated body mass index is 18.54 kg/m as calculated from the following:   Height as of this encounter: 5\' 4"  (1.626 m).   Weight as of this encounter: 49 kg.   Discharge Diagnoses:  Principal Problem:   Acute on chronic respiratory failure with hypoxia secondary to RSV with acute COPD exacerbation Active Problems:   HTN (hypertension)   Hypothyroidism   Sinus tachycardia   Hyperlipidemia   Hx of TIA (transient ischemic attack) and stroke   COPD with acute exacerbation (HCC)   RSV (respiratory syncytial virus pneumonia)   Discharge Instructions:  Activity:  As tolerated with Full fall precautions use walker/cane & assistance as needed Discharge Instructions     Call MD for:  difficulty breathing, headache or visual disturbances   Complete by: As directed    Diet - low sodium heart healthy   Complete by: As directed    Discharge instructions   Complete by: As directed    Follow with Primary MD  [facility] in 1-2 weeks  Please get a complete blood count and chemistry panel checked by your Primary MD at your next visit, and again as instructed by your Primary MD.  Get Medicines reviewed and adjusted: Please take all your medications with you for your next visit with your Primary MD  Laboratory/radiological data: Please request your Primary MD to go over all hospital tests and procedure/radiological results at the follow up, please ask your Primary MD to get all Hospital records sent to his/her office.  In some  cases, they will be blood work, cultures and biopsy results pending at the time of your discharge. Please request that your primary care M.D. follows up on these results.  Also Note the following: If you experience worsening of your admission symptoms, develop shortness of breath, life threatening emergency, suicidal or homicidal thoughts you must seek medical attention immediately by calling 911 or calling your MD immediately  if  symptoms less severe.  You must read complete instructions/literature along with all the possible adverse reactions/side effects for all the Medicines you take and that have been prescribed to you. Take any new Medicines after you have completely understood and accpet all the possible adverse reactions/side effects.   Do not drive when taking Pain medications or sleeping medications (Benzodaizepines)  Do not take more than prescribed Pain, Sleep and Anxiety Medications. It is not advisable to combine anxiety,sleep and pain medications without talking with your primary care practitioner  Special Instructions: If you have smoked or chewed Tobacco  in the last 2 yrs please stop smoking, stop any regular Alcohol  and or any Recreational drug use.  Wear Seat belts while driving.  Please note: You were cared for by a hospitalist during your hospital stay. Once you are discharged, your primary care physician will handle any further medical issues. Please note that NO REFILLS for any discharge medications will be authorized once you are discharged, as it is imperative that you return to your primary care physician (or establish a relationship with a primary care physician if you do not have one) for your post hospital discharge needs so that they can reassess your need for medications and monitor your lab values.   Increase activity slowly   Complete by: As directed       Allergies as of 10/20/2023       Reactions   Bisoprolol Fumarate Other (See Comments)   Other reaction(s): heart issues        Medication List     TAKE these medications    albuterol 108 (90 Base) MCG/ACT inhaler Commonly known as: VENTOLIN HFA Inhale 2 puffs into the lungs every 6 (six) hours as needed for wheezing or shortness of breath.   albuterol (2.5 MG/3ML) 0.083% nebulizer solution Commonly known as: PROVENTIL Take 3 mLs (2.5 mg total) by nebulization every 6 (six) hours as needed for wheezing or shortness of  breath.   alendronate 70 MG tablet Commonly known as: FOSAMAX Take 70 mg by mouth every Saturday.   amitriptyline 50 MG tablet Commonly known as: ELAVIL Take 50 mg by mouth at bedtime.   aspirin EC 81 MG tablet Take 81 mg daily by mouth.   benzonatate 100 MG capsule Commonly known as: Tessalon Perles Take 1 capsule (100 mg total) by mouth 3 (three) times daily as needed for cough. What changed: You were already taking a medication with the same name, and this prescription was added. Make sure you understand how and when to take each.   benzonatate 100 MG capsule Commonly known as: TESSALON Take 1 capsule (100 mg total) by mouth every 8 (eight) hours. What changed: Another medication with the same name was added. Make sure you understand how and when to take each.   cyclobenzaprine 5 MG tablet Commonly known as: FLEXERIL Take 5 mg by mouth in the morning and at bedtime.   Daliresp 500 MCG Tabs tablet Generic drug: roflumilast Take 1 tablet by mouth once daily   roflumilast 500 MCG Tabs tablet Commonly known as: DALIRESP  Take 1 tablet (500 mcg total) by mouth daily.   hydroxyurea 500 MG capsule Commonly known as: HYDREA TAKE ONE CAPSULE BY MOUTH EVERY OTHER DAY WITH FOOD TO MINIMIZE GI SIDE EFFECTS   hydrOXYzine 25 MG tablet Commonly known as: ATARAX Take 1 tablet (25 mg total) by mouth every 6 (six) hours as needed for anxiety.   levothyroxine 75 MCG tablet Commonly known as: SYNTHROID Take 1 tablet (75 mcg total) by mouth every morning. What changed:  medication strength how much to take   metoprolol succinate 25 MG 24 hr tablet Commonly known as: TOPROL-XL Take 1 tablet (25 mg total) by mouth daily.   multivitamin with minerals Tabs tablet Take 1 tablet by mouth daily.   OXYGEN Inhale 2 L into the lungs continuous.   predniSONE 5 MG tablet Commonly known as: DELTASONE Take 40 mg p.o. daily for 2 days, then 30 mg p.o. daily for 2 days, then 20 mg p.o.  daily for 2 days, then 10 mg p.o. daily for 2 days, and then transition back to your usual regimen of 5 mg p.o. daily. What changed:  See the new instructions. Another medication with the same name was removed. Continue taking this medication, and follow the directions you see here.   Repatha SureClick 140 MG/ML Soaj Generic drug: Evolocumab Inject 140 mg into the skin every 14 (fourteen) days. What changed: Another medication with the same name was removed. Continue taking this medication, and follow the directions you see here.   Trelegy Ellipta 100-62.5-25 MCG/ACT Aepb Generic drug: Fluticasone-Umeclidin-Vilant Inhale 1 puff into the lungs daily.        Follow-up Information     [facility]. Schedule an appointment as soon as possible for a visit in 1 week(s).   Specialty: Rheumatology Contact information: [address] [place] [address] [phone]                Allergies  Allergen Reactions   Bisoprolol Fumarate Other (See Comments)    Other reaction(s): heart issues     Other Procedures/Studies: DG Chest Portable 1 View Result Date: 10/16/2023 CLINICAL DATA:  Cough. EXAM: PORTABLE CHEST 1 VIEW COMPARISON:  10/14/2023. FINDINGS: Bilateral lungs appear hyperexpanded and hyperlucent with coarse bronchovascular markings, in keeping with COPD. There is stable scarring overlying the right lung apex. Bilateral lungs otherwise appear clear. No dense consolidation or lung collapse. Apparent blunting of right lateral costophrenic angle is similar to the prior study and favored to represent pleural thickening. Left lateral costophrenic angle is clear. Normal cardio-mediastinal silhouette. No acute osseous abnormalities. The soft tissues are within normal limits. IMPRESSION: *No acute cardiopulmonary abnormality.  COPD. Electronically Signed   By: Jules Schick M.D.   On: 10/16/2023 11:28   DG Chest 2 View Result Date: 10/14/2023 CLINICAL DATA:   Shortness of breath. EXAM: CHEST - 2 VIEW COMPARISON:  Chest radiograph dated June 27, 2023. FINDINGS: Stable cardiomediastinal silhouette. Aortic atherosclerosis. Emphysematous changes with scarring again noted in the right upper lobe. Stable chronic blunting of the right costophrenic angle. No focal consolidation, pleural effusion, or pneumothorax. Single surgical clip noted in the right upper lobe. No acute osseous abnormality. IMPRESSION: 1. No acute cardiopulmonary findings. 2. Stable emphysematous changes and scarring in the right upper lobe. Electronically Signed   By: Hart Robinsons M.D.   On: 10/14/2023 14:13     TODAY-DAY OF DISCHARGE:  Subjective:   Harvel Quale today has no headache,no chest abdominal pain,no new weakness tingling or numbness,  feels much better wants to go home today.   Objective:   Blood pressure 129/66, pulse (!) 106, temperature 97.8 F (36.6 C), temperature source Oral, resp. rate 20, height 5\' 4"  (1.626 m), weight 49 kg, SpO2 96%. No intake or output data in the 24 hours ending 10/20/23 1000 Filed Weights   10/16/23 1050 10/17/23 0307  Weight: 47 kg 49 kg    Exam: Awake Alert, Oriented *3, No new F.N deficits, Normal affect [place].AT,PERRAL Supple Neck,No JVD, No cervical lymphadenopathy appriciated.  Symmetrical Chest wall movement, Good air movement bilaterally, CTAB RRR,No Gallops,Rubs or new Murmurs, No Parasternal Heave +ve B.Sounds, Abd Soft, Non tender, No organomegaly appriciated, No rebound -guarding or rigidity. No Cyanosis, Clubbing or edema, No new Rash or bruise   PERTINENT RADIOLOGIC STUDIES: No results found.   PERTINENT LAB RESULTS: CBC: No results for input(s): "WBC", "HGB", "HCT", "PLT" in the last 72 hours. CMET CMP     Component Value Date/Time   NA 139 10/19/2023 0416   K 4.1 10/19/2023 0416   CL 94 (L) 10/19/2023 0416   CO2 38 (H) 10/19/2023 0416   GLUCOSE 104 (H) 10/19/2023 0416   BUN 15 10/19/2023 0416    CREATININE 0.59 10/19/2023 0416   CREATININE 1.00 10/12/2023 1410   CALCIUM 8.6 (L) 10/19/2023 0416   PROT 6.6 10/16/2023 1000   ALBUMIN 3.6 10/16/2023 1000   AST 26 10/16/2023 1000   AST 20 10/12/2023 1410   ALT 15 10/16/2023 1000   ALT 10 10/12/2023 1410   ALKPHOS 48 10/16/2023 1000   BILITOT 0.5 10/16/2023 1000   BILITOT 0.4 10/12/2023 1410   GFRNONAA >60 10/19/2023 0416   GFRNONAA >60 10/12/2023 1410    GFR Estimated Creatinine Clearance: 51.3 mL/min (by C-G formula based on SCr of 0.59 mg/dL). No results for input(s): "LIPASE", "AMYLASE" in the last 72 hours. No results for input(s): "CKTOTAL", "CKMB", "CKMBINDEX", "TROPONINI" in the last 72 hours. Invalid input(s): "POCBNP" No results for input(s): "DDIMER" in the last 72 hours. No results for input(s): "HGBA1C" in the last 72 hours. No results for input(s): "CHOL", "HDL", "LDLCALC", "TRIG", "CHOLHDL", "LDLDIRECT" in the last 72 hours. No results for input(s): "TSH", "T4TOTAL", "T3FREE", "THYROIDAB" in the last 72 hours.  Invalid input(s): "FREET3" No results for input(s): "VITAMINB12", "FOLATE", "FERRITIN", "TIBC", "IRON", "RETICCTPCT" in the last 72 hours. Coags: No results for input(s): "INR" in the last 72 hours.  Invalid input(s): "PT" Microbiology: Recent Results (from the past 240 hours)  Resp panel by RT-PCR (RSV, Flu A&B, Covid) Anterior Nasal Swab     Status: Abnormal   Collection Time: 10/14/23 11:46 AM   Specimen: Anterior Nasal Swab  Result Value Ref Range Status   SARS Coronavirus 2 by RT PCR NEGATIVE NEGATIVE Final    Comment: (NOTE) SARS-CoV-2 target nucleic acids are NOT DETECTED.  The SARS-CoV-2 RNA is generally detectable in upper respiratory specimens during the acute phase of infection. The lowest concentration of SARS-CoV-2 viral copies this assay can detect is 138 copies/mL. A negative result does not preclude SARS-Cov-2 infection and should not be used as the sole basis for treatment  or other patient management decisions. A negative result may occur with  improper specimen collection/handling, submission of specimen other than nasopharyngeal swab, presence of viral mutation(s) within the areas targeted by this assay, and inadequate number of viral copies(<138 copies/mL). A negative result must be combined with clinical observations, patient history, and epidemiological information. The expected result is Negative.  Fact Sheet for  Patients:  BloggerCourse.com  Fact Sheet for Healthcare Providers:  SeriousBroker.it  This test is no t yet approved or cleared by the [place] FDA and  has been authorized for detection and/or diagnosis of SARS-CoV-2 by FDA under an Emergency Use Authorization (EUA). This EUA will remain  in effect (meaning this test can be used) for the duration of the COVID-19 declaration under Section 564(b)(1) of the Act, 21 U.S.C.section 360bbb-3(b)(1), unless the authorization is terminated  or revoked sooner.       Influenza A by PCR NEGATIVE NEGATIVE Final   Influenza B by PCR NEGATIVE NEGATIVE Final    Comment: (NOTE) The Xpert Xpress SARS-CoV-2/FLU/RSV plus assay is intended as an aid in the diagnosis of influenza from Nasopharyngeal swab specimens and should not be used as a sole basis for treatment. Nasal washings and aspirates are unacceptable for Xpert Xpress SARS-CoV-2/FLU/RSV testing.  Fact Sheet for Patients: BloggerCourse.com  Fact Sheet for Healthcare Providers: SeriousBroker.it  This test is not yet approved or cleared by the [place] FDA and has been authorized for detection and/or diagnosis of SARS-CoV-2 by FDA under an Emergency Use Authorization (EUA). This EUA will remain in effect (meaning this test can be used) for the duration of the COVID-19 declaration under Section 564(b)(1) of the Act, 21 U.S.C. section  360bbb-3(b)(1), unless the authorization is terminated or revoked.     Resp Syncytial Virus by PCR POSITIVE (A) NEGATIVE Final    Comment: (NOTE) Fact Sheet for Patients: BloggerCourse.com  Fact Sheet for Healthcare Providers: SeriousBroker.it  This test is not yet approved or cleared by the [place] FDA and has been authorized for detection and/or diagnosis of SARS-CoV-2 by FDA under an Emergency Use Authorization (EUA). This EUA will remain in effect (meaning this test can be used) for the duration of the COVID-19 declaration under Section 564(b)(1) of the Act, 21 U.S.C. section 360bbb-3(b)(1), unless the authorization is terminated or revoked.  Performed at [facility], [address] [place], [place] [address]   Expectorated Sputum Assessment w Gram Stain, Rflx to Resp Cult     Status: None   Collection Time: 10/18/23  6:57 PM   Specimen: Expectorated Sputum  Result Value Ref Range Status   Specimen Description EXPECTORATED SPUTUM  Final   Special Requests NONE  Final   Sputum evaluation   Final    THIS SPECIMEN IS ACCEPTABLE FOR SPUTUM CULTURE Performed at [facility], [address] [place], [place] [address]    Report Status 10/18/2023 FINAL  Final  Culture, Respiratory w Gram Stain     Status: None (Preliminary result)   Collection Time: 10/18/23  6:57 PM  Result Value Ref Range Status   Specimen Description EXPECTORATED SPUTUM  Final   Special Requests NONE Reflexed from [id number]  Final   Gram Stain   Final    FEW WBC SEEN RARE YEAST RARE GRAM NEGATIVE RODS    Culture   Final    TOO YOUNG TO READ Performed at [facility], [address] [place], [place] [address]    Report Status PENDING  Incomplete    FURTHER DISCHARGE INSTRUCTIONS:  Get Medicines reviewed and adjusted: Please take all your medications with you for your next visit with your Primary  MD  Laboratory/radiological data: Please request your Primary MD to go over all hospital tests and procedure/radiological results at the follow up, please ask your Primary MD to get all Hospital records sent to his/her  office.  In some cases, they will be blood work, cultures and biopsy results pending at the time of your discharge. Please request that your primary care M.D. goes through all the records of your hospital data and follows up on these results.  Also Note the following: If you experience worsening of your admission symptoms, develop shortness of breath, life threatening emergency, suicidal or homicidal thoughts you must seek medical attention immediately by calling 911 or calling your MD immediately  if symptoms less severe.  You must read complete instructions/literature along with all the possible adverse reactions/side effects for all the Medicines you take and that have been prescribed to you. Take any new Medicines after you have completely understood and accpet all the possible adverse reactions/side effects.   Do not drive when taking Pain medications or sleeping medications (Benzodaizepines)  Do not take more than prescribed Pain, Sleep and Anxiety Medications. It is not advisable to combine anxiety,sleep and pain medications without talking with your primary care practitioner  Special Instructions: If you have smoked or chewed Tobacco  in the last 2 yrs please stop smoking, stop any regular Alcohol  and or any Recreational drug use.  Wear Seat belts while driving.  Please note: You were cared for by a hospitalist during your hospital stay. Once you are discharged, your primary care physician will handle any further medical issues. Please note that NO REFILLS for any discharge medications will be authorized once you are discharged, as it is imperative that you return to your primary care physician (or establish a relationship with a primary care physician if you do not have  one) for your post hospital discharge needs so that they can reassess your need for medications and monitor your lab values.  Total Time spent coordinating discharge including counseling, education and face to face time equals greater than 30 minutes.  Signed: Laneisha Mino 10/20/2023 10:00 AM

## 2023-10-20 NOTE — Care Management Important Message (Signed)
 Important Message  Patient Details  Name: Cheryl Haley MRN: [id number] Date of Birth: [birth year]   Important Message Given:  Yes - Medicare IM  Patient left prior to IM delivery will mail a copy to the patient home address.   Madysin Crisp 10/20/2023, 12:25 PM

## 2023-10-20 NOTE — Progress Notes (Signed)
   10/20/23 0012  BiPAP/CPAP/SIPAP  Reason BIPAP/CPAP not in use Non-compliant (refused)

## 2023-10-20 NOTE — Plan of Care (Signed)
  Problem: Health Behavior/Discharge Planning: Goal: Ability to manage health-related needs will improve Outcome: Progressing   Problem: Clinical Measurements: Goal: Will remain free from infection Outcome: Progressing Goal: Respiratory complications will improve Outcome: Progressing   Problem: Activity: Goal: Risk for activity intolerance will decrease Outcome: Progressing   Problem: Coping: Goal: Level of anxiety will decrease Outcome: Progressing

## 2023-10-20 NOTE — Progress Notes (Signed)
 AVS provided, educational information discussed and reviewed with patient verbalizing understanding.  PIC d/c'd with out complications.  Denies questions or concerns.  Discharged home via wheelchair with sister.

## 2023-10-20 NOTE — Plan of Care (Signed)
 Adequate for discharge.

## 2023-10-21 ENCOUNTER — Telehealth: Payer: Self-pay

## 2023-10-21 LAB — CULTURE, RESPIRATORY W GRAM STAIN

## 2023-10-21 NOTE — Transitions of Care (Post Inpatient/ED Visit) (Signed)
   10/21/2023  Name: Cheryl Haley MRN: [id number] DOB: [birth year]  Today's TOC FU Call Status: Today's TOC FU Call Status:: Unsuccessful Call (1st Attempt) Unsuccessful Call (1st Attempt) Date: 10/21/23  Attempted to reach the patient regarding the most recent Inpatient/ED visit.  Follow Up Plan: Additional outreach attempts will be made to reach the patient to complete the Transitions of Care (Post Inpatient/ED visit) call.   Wyline Mood BSN, RN Medical illustrator, Transition of Care [place] [facility], Population Health Direct Dial:  [phone] / Fax: [phone] Website: [email or web address]

## 2023-10-22 ENCOUNTER — Telehealth: Payer: Self-pay

## 2023-10-22 NOTE — Transitions of Care (Post Inpatient/ED Visit) (Signed)
   10/22/2023  Name: Cheryl Haley MRN: [id number] DOB: 28-Dec-1955  Today's TOC FU Call Status: Today's TOC FU Call Status:: Unsuccessful Call (2nd Attempt) Unsuccessful Call (2nd Attempt) Date: 10/22/23  Attempted to reach the patient regarding the most recent Inpatient/ED visit.  Follow Up Plan: Additional outreach attempts will be made to reach the patient to complete the Transitions of Care (Post Inpatient/ED visit) call.   Wyline Mood BSN, RN Medical illustrator, Transition of Care [place] [facility], Population Health Direct Dial:  [phone] / Fax: [phone] Email:  [email or web address]: [email or web address]

## 2023-10-27 ENCOUNTER — Telehealth: Payer: Self-pay | Admitting: Adult Health

## 2023-10-27 NOTE — Telephone Encounter (Signed)
 Patient needs a refill of albuterol solution.  Pharmacy:[facility]

## 2023-10-28 MED ORDER — ALBUTEROL SULFATE (2.5 MG/3ML) 0.083% IN NEBU: 2.5000 mg | INHALATION_SOLUTION | Freq: Four times a day (QID) | RESPIRATORY_TRACT | 5 refills | Status: DC | PRN

## 2023-10-28 NOTE — Telephone Encounter (Signed)
 Rx sent to pharmacy

## 2023-10-29 ENCOUNTER — Ambulatory Visit (INDEPENDENT_AMBULATORY_CARE_PROVIDER_SITE_OTHER): Payer: Medicare Other | Admitting: Adult Health

## 2023-10-29 ENCOUNTER — Encounter: Payer: Self-pay | Admitting: Adult Health

## 2023-10-29 VITALS — BP 118/60 | HR 100 | Ht 64.0 in | Wt 98.0 lb

## 2023-10-29 DIAGNOSIS — R918 Other nonspecific abnormal finding of lung field: Secondary | ICD-10-CM | POA: Diagnosis not present

## 2023-10-29 DIAGNOSIS — J9611 Chronic respiratory failure with hypoxia: Secondary | ICD-10-CM | POA: Diagnosis not present

## 2023-10-29 DIAGNOSIS — J441 Chronic obstructive pulmonary disease with (acute) exacerbation: Secondary | ICD-10-CM

## 2023-10-29 DIAGNOSIS — E43 Unspecified severe protein-calorie malnutrition: Secondary | ICD-10-CM | POA: Diagnosis not present

## 2023-10-29 NOTE — Progress Notes (Signed)
 @Patient  ID: Cheryl Haley, female    DOB: 04-16-1956, 68 y.o.   MRN: [id number]  Chief Complaint  Patient presents with   Follow-up    Referring provider: [facility], Ginette Otto *  HPI: 68 year old female followed for COPD and chronic respiratory failure and lung nodules On chronic steroids since July 2022   TEST/EVENTS :  Chest Imaging- films reviewed: CT chest 06/28/2019- severe centrilobular emphysema throughout both lungs.  Left upper lobe spiculated nodule associated with linear scar.  Right middle lobe spiculated nodule.  Airway thickening.  Scarring in right greater than left lower lobes.  No significant mediastinal or hilar adenopathy.  Nodules stable or smaller than previous CT scans.   CT cardiac scoring 10/07/2019- redemonstrated right middle lobe nodule.  No new concerning nodules.   Interval near complete resolution of previously demonstrated lingular infiltrate, consistent with resolving pneumonia. 2. The 3 suspicious right lung nodules seen on the most recent study persist, and based on multiplicity could be postinflammatory.   PET scan 07/09/2021- Moderate metabolic activity associated with irregular nodules clustered in the RIGHT upper lobe. Favor post inflammatory or infectious process. Cannot exclude neoplasm. Recommend tissue sampling versus CT surveillance. 2. Persistent but decreased activity in LEFT upper lobe parenchymal lesion.   Navigational bronchoscopy August 28, 2021.  Cytology was negative for malignancy.  Cultures were negative.    CT chest Jan 10, 2022 showed minimally progressed right upper lobe consolidation and new nodular opacities along the lateral aspect of this consolidation questionable mucoid impaction, previous pulmonary nodules stable    PET scan 06/2022 -Hypermetabolism corresponding to posterior right upper lobe area of partially cavitary consolidation. Morphology and growth rate over multiple prior exams favor an infectious or  inflammatory etiology. Neoplasm felt less likely.Bilateral small pulmonary nodules with low-level hypermetabolism, primarily to the prior PET of 07/09/2021 and favored to be benign. Right hilar hypermetabolism without adenopathy is also favored to be reactive   CT chest December 05, 2022 decrease size of right upper lobe consolidation consistent with residual scarring, irregular solid pulmonary nodules left lower lobe measuring 6 mm and 4 mm.   CT chest Jan 26, 2023 negative for PE, patchy nodular consolidation right lung apex and previous small pulmonary nodules have resolved.       Pulmonary Functions Testing Results: Spirometry 2015: FVC  2.0 (63%) FEV1  0.8 (33%) Ratio 41   pulmonary function testing that was done today that showed very severe airflow obstruction with FEV1 at 24%, ratio 38, FVC 50% and DLCO at 28%.    10/25/2019 SPECT scan: EF 63%, low risk study.  No EKG changes during stress.   Alpha-1 MS 155   Completed pulmonary rehab 2024  10/29/2023 Follow up ; COPD, chronic respiratory failure, lung nodules Patient returns for a 73-month follow-up.  Patient has very severe COPD and oxygen dependent respiratory failure on home oxygen.  Since last visit she has completed pulmonary rehab.  She remains on Breztri twice daily.  She is on chronic steroids with prednisone 5 mg daily.  Takes Daliresp daily.  She is on oxygen 2 L at rest and 3 L with activity.   Was admitted earlier this month for COPD exacerbation in the setting of RSV infection.  She was treated with empiric antibiotics with Zithromax, steroids and nebulized bronchodilators.  Since discharge she is feeling some better but remains very weak.  Has increased cough that is minimally productive.  Denies any hemoptysis, chest pain, orthopnea.  Appetite has been down.  Weight is  down to 98 pounds with a BMI at 16.  Patient has a history of pulmonary nodules underwent previous navigational bronchoscopy December 2022 with negative  cytology.  PET scan October 2023 showed hypermetabolic activity in the right upper lobe felt to favor an inflammatory etiology but neoplasm felt less likely.  Bilateral pulmonary nodules similar to prior PET scan and November 2022.  Right hilar hypermetabolism without adenopathy favored to be reactive.  Patient was set up for serial CT chest last completed May 20, 2023 that showed stable size of the right upper lobe nodule with adjacent fiducials.  Right lung apex density slightly larger but reduced in thickness compatible with improving inflammation.  Patient was recommended on a follow-up CT which is due next month.   Allergies  Allergen Reactions   Bisoprolol Fumarate Other (See Comments)    Other reaction(s): heart issues    Immunization History  Administered Date(s) Administered   Fluad Quad(high Dose 65+) 06/06/2020, 05/10/2021, 06/05/2022   Fluad Trivalent(High Dose 65+) 05/28/2023   Influenza Split 05/31/2015, 06/07/2018, 05/09/2019   Influenza,inj,Quad PF,6+ Mos 09/22/2013, 06/10/2016, 06/25/2017, 04/30/2018, 06/01/2019   Influenza,inj,quad, With Preservative 09/07/2015   PFIZER(Purple Top)SARS-COV-2 Vaccination 11/27/2019, 12/18/2019   Pneumococcal Conjugate-13 04/17/2015   Pneumococcal Polysaccharide-23 12/08/2016   Tdap 04/10/2017    Past Medical History:  Diagnosis Date   Agatston coronary artery calcium score greater than 400    score 678   Anxiety    Arthritis    "in neck"   COPD (chronic obstructive pulmonary disease) (HCC)    Diabetes (HCC)    Dyspnea    Emphysema lung (HCC)    Essential thrombocythemia (HCC) 09/15/2019   History of hiatal hernia    Hx of TIA (transient ischemic attack) and stroke    Hyperlipidemia    Hypertension    Hypothyroidism    Impaired fasting glucose    Pleural effusion 05/15/2014   CXR 05/2014:  New small right effusion.     Pneumonia    Protein-calorie malnutrition, severe 09/17/2018   Pulmonary nodules 07/14/2018    07/2017 CT chest >pulmonary nodules noted.  07/2017 PET scan -Hypermetabolic LUL nodule  12/2017 CT chest >decreased nodule size, new pulmonary nodules noted.  FOB -atypical cells  CT chest 07/2018 >stable nodules to smaller on established nodules . 2 new nodules RUL , RLL >CT chest 3 months    Thyroid disease    Tobacco abuse 11/28/2015   Smoked 2 packs of cigarettes daily from teenage years until age 57, quit in 2016    Unspecified hypothyroidism 09/20/2013   Viral respiratory infection 09/16/2018    Tobacco History: Social History   Tobacco Use  Smoking Status Former   Current packs/day: 0.00   Average packs/day: 2.0 packs/day for 40.0 years (80.0 ttl pk-yrs)   Types: Cigarettes   Start date: 03/16/1977   Quit date: 03/16/2017   Years since quitting: 6.6  Smokeless Tobacco Never  Tobacco Comments       Counseling given: Not Answered Tobacco comments:     Outpatient Medications Prior to Visit  Medication Sig Dispense Refill   albuterol (PROVENTIL) (2.5 MG/3ML) 0.083% nebulizer solution Take 3 mLs (2.5 mg total) by nebulization every 6 (six) hours as needed for wheezing or shortness of breath. 75 mL 5   albuterol (VENTOLIN HFA) 108 (90 Base) MCG/ACT inhaler Inhale 2 puffs into the lungs every 6 (six) hours as needed for wheezing or shortness of breath. 18 g 6   alendronate (FOSAMAX) 70 MG tablet Take 70  mg by mouth every Saturday.     amitriptyline (ELAVIL) 50 MG tablet Take 50 mg by mouth at bedtime.     aspirin EC 81 MG tablet Take 81 mg daily by mouth.      benzonatate (TESSALON) 100 MG capsule Take 1 capsule (100 mg total) by mouth every 8 (eight) hours. 21 capsule 0   cyclobenzaprine (FLEXERIL) 5 MG tablet Take 5 mg by mouth in the morning and at bedtime.     DALIRESP 500 MCG TABS tablet Take 1 tablet by mouth once daily 30 tablet 2   Evolocumab (REPATHA SURECLICK) 140 MG/ML SOAJ Inject 140 mg into the skin every 14 (fourteen) days. 6 mL 3   Fluticasone-Umeclidin-Vilant  (TRELEGY ELLIPTA) 100-62.5-25 MCG/ACT AEPB Inhale 1 puff into the lungs daily. 60 each 2   hydroxyurea (HYDREA) 500 MG capsule TAKE ONE CAPSULE BY MOUTH EVERY OTHER DAY WITH FOOD TO MINIMIZE GI SIDE EFFECTS 45 capsule 1   hydrOXYzine (ATARAX) 25 MG tablet Take 1 tablet (25 mg total) by mouth every 6 (six) hours as needed for anxiety. 15 tablet 0   levothyroxine (SYNTHROID) 75 MCG tablet Take 1 tablet (75 mcg total) by mouth every morning. 30 tablet 1   metoprolol succinate (TOPROL-XL) 25 MG 24 hr tablet Take 1 tablet (25 mg total) by mouth daily. 90 tablet 3   Multiple Vitamin (MULTIVITAMIN WITH MINERALS) TABS tablet Take 1 tablet by mouth daily.     OXYGEN Inhale 2 L into the lungs continuous.     predniSONE (DELTASONE) 5 MG tablet Take 8 tablets (40 mg total) by mouth daily for 2 days, THEN 6 tablets (30 mg total) daily for 2 days, THEN 4 tablets (20 mg total) daily for 2 days, THEN 2 tablets (10 mg total) daily for 2 days, THEN 1 tablet (5 mg total) daily 90 tablet 1   roflumilast (DALIRESP) 500 MCG TABS tablet Take 1 tablet (500 mcg total) by mouth daily. 30 tablet 5   benzonatate (TESSALON PERLES) 100 MG capsule Take 1 capsule (100 mg total) by mouth 3 (three) times daily as needed for cough. (Patient not taking: Reported on 10/29/2023) 30 capsule 0   No facility-administered medications prior to visit.     Review of Systems:   Constitutional:   No   night sweats,  Fevers, chills,  +fatigue, or  lassitude.  HEENT:   No headaches,  Difficulty swallowing,  Tooth/dental problems, or  Sore throat,                No sneezing, itching, ear ache, nasal congestion, post nasal drip,   CV:  No chest pain,  Orthopnea, PND, swelling in lower extremities, anasarca, dizziness, palpitations, syncope.   GI  No heartburn, indigestion, abdominal pain, nausea, vomiting, diarrhea, change in bowel habits, loss of appetite, bloody stools.   Resp: No  No chest wall deformity  Skin: no rash or  lesions.  GU: no dysuria, change in color of urine, no urgency or frequency.  No flank pain, no hematuria   MS:  No joint pain or swelling.  No decreased range of motion.  No back pain.    Physical Exam  BP 118/60 (BP Location: Right Arm, Patient Position: Sitting, Cuff Size: Normal)   Pulse 100   Ht 5\' 4"  (1.626 m)   Wt 98 lb (44.5 kg)   SpO2 96%   BMI 16.82 kg/m   GEN: A/Ox3; pleasant , NAD, thin, cachectic, elderly, on oxygen, wheelchair   HEENT:  [place]/AT,  EACs-clear, TMs-wnl, NOSE-clear, THROAT-clear, no lesions, no postnasal drip or exudate noted.   NECK:  Supple w/ fair ROM; no JVD; normal carotid impulses w/o bruits; no thyromegaly or nodules palpated; no lymphadenopathy.    RESP few scattered rhonchi  no accessory muscle use, no dullness to percussion  CARD:  RRR, no m/r/g, no peripheral edema, pulses intact, no cyanosis or clubbing.  GI:   Soft & nt; nml bowel sounds; no organomegaly or masses detected.   Musco: Warm bil, no deformities or joint swelling noted.   Neuro: alert, no focal deficits noted.    Skin: Warm, no lesions or rashes    Lab Results:   BMET    Imaging: DG Chest Portable 1 View Result Date: 10/16/2023 CLINICAL DATA:  Cough. EXAM: PORTABLE CHEST 1 VIEW COMPARISON:  10/14/2023. FINDINGS: Bilateral lungs appear hyperexpanded and hyperlucent with coarse bronchovascular markings, in keeping with COPD. There is stable scarring overlying the right lung apex. Bilateral lungs otherwise appear clear. No dense consolidation or lung collapse. Apparent blunting of right lateral costophrenic angle is similar to the prior study and favored to represent pleural thickening. Left lateral costophrenic angle is clear. Normal cardio-mediastinal silhouette. No acute osseous abnormalities. The soft tissues are within normal limits. IMPRESSION: *No acute cardiopulmonary abnormality.  COPD. Electronically Signed   By: Jules Schick M.D.   On: 10/16/2023 11:28   DG  Chest 2 View Result Date: 10/14/2023 CLINICAL DATA:  Shortness of breath. EXAM: CHEST - 2 VIEW COMPARISON:  Chest radiograph dated June 27, 2023. FINDINGS: Stable cardiomediastinal silhouette. Aortic atherosclerosis. Emphysematous changes with scarring again noted in the right upper lobe. Stable chronic blunting of the right costophrenic angle. No focal consolidation, pleural effusion, or pneumothorax. Single surgical clip noted in the right upper lobe. No acute osseous abnormality. IMPRESSION: 1. No acute cardiopulmonary findings. 2. Stable emphysematous changes and scarring in the right upper lobe. Electronically Signed   By: Hart Robinsons M.D.   On: 10/14/2023 14:13    Administration History     None          Latest Ref Rng & Units 02/16/2023    9:52 AM 07/19/2021   10:13 AM  PFT Results  FVC-Pre L 1.54  1.48   FVC-Predicted Pre % 50  47   FVC-Post L 1.52    FVC-Predicted Post % 49    Pre FEV1/FVC % % 38  36   Post FEV1/FCV % % 36    FEV1-Pre L 0.58  0.54   FEV1-Predicted Pre % 24  22   FEV1-Post L 0.55    DLCO uncorrected ml/min/mmHg 5.59  5.56   DLCO UNC% % 28  28   DLCO corrected ml/min/mmHg 5.70  5.56   DLCO COR %Predicted % 29  28   DLVA Predicted % 46  47   TLC L 6.30    TLC % Predicted % 124    RV % Predicted % 221      No results found for: "NITRICOXIDE"      Assessment & Plan:   COPD exacerbation (HCC) Recent COPD exacerbation in the setting of RSV infection.-slowly improving   Plan  Patient Instructions  Finish prednisone taper today as planned and then resume Prednisone 5mg  daily .  Set up CT chest for next month as planned  Continue on Trelegy daily  , rinse after use.  Robitussin DM As needed  for cough/congestion .  Albuterol inhaler/neb As needed   Flutter valve Twice daily  Continue on Prednisone 5mg  daily  Continue on Daliresp daily  Continue on Oxygen 2l/m at rest and 3 L with activity Activity as tolerated.  High protein diet .   Follow up with Dr. Francine Graven or Nereyda Bowler NP in 3 months and As needed  (30 min slot)      Chronic respiratory failure with hypoxia (HCC) Continue on oxygen to maintain O2 saturations greater than 88 to 90%  Plan Patient Instructions  Finish prednisone taper today as planned and then resume Prednisone 5mg  daily .  Set up CT chest for next month as planned  Continue on Trelegy daily  , rinse after use.  Robitussin DM As needed  for cough/congestion .  Albuterol inhaler/neb As needed   Flutter valve Twice daily   Continue on Prednisone 5mg  daily  Continue on Daliresp daily  Continue on Oxygen 2l/m at rest and 3 L with activity Activity as tolerated.  High protein diet .  Follow up with Dr. Francine Graven or Mireyah Chervenak NP in 3 months and As needed  (30 min slot)     Pulmonary nodules Long history of scattered pulmonary nodules dating back to 2018.  PET scan that showed some hypermetabolic activity.  Navigational bronchoscopy 2022 showed negative cytology.  Most recent serial CT chest September 2024 showed right apex density slightly larger but with reduced thickness compatible with inflammatory process.  Follow-up CT chest is due next month.  Protein-calorie malnutrition, severe Encouraged on a high-protein diet.  Weight is down after recent acute illness     Rubye Oaks, NP 10/29/2023

## 2023-10-29 NOTE — Patient Instructions (Addendum)
 Finish prednisone taper today as planned and then resume Prednisone 5mg  daily .  Set up CT chest for next month as planned  Continue on Trelegy daily  , rinse after use.  Robitussin DM As needed  for cough/congestion .  Albuterol inhaler/neb As needed   Flutter valve Twice daily   Continue on Prednisone 5mg  daily  Continue on Daliresp daily  Continue on Oxygen 2l/m at rest and 3 L with activity Activity as tolerated.  High protein diet .  Follow up with Dr. Francine Graven or Josip Merolla NP in 3 months and As needed  (30 min slot)

## 2023-10-29 NOTE — Assessment & Plan Note (Signed)
 Long history of scattered pulmonary nodules dating back to 2018.  PET scan that showed some hypermetabolic activity.  Navigational bronchoscopy 2022 showed negative cytology.  Most recent serial CT chest September 2024 showed right apex density slightly larger but with reduced thickness compatible with inflammatory process.  Follow-up CT chest is due next month.

## 2023-10-29 NOTE — Assessment & Plan Note (Signed)
 Recent COPD exacerbation in the setting of RSV infection.-slowly improving   Plan  Patient Instructions  Finish prednisone taper today as planned and then resume Prednisone 5mg  daily .  Set up CT chest for next month as planned  Continue on Trelegy daily  , rinse after use.  Robitussin DM As needed  for cough/congestion .  Albuterol inhaler/neb As needed   Flutter valve Twice daily   Continue on Prednisone 5mg  daily  Continue on Daliresp daily  Continue on Oxygen 2l/m at rest and 3 L with activity Activity as tolerated.  High protein diet .  Follow up with Dr. Francine Graven or Rorey Hodges NP in 3 months and As needed  (30 min slot)

## 2023-10-29 NOTE — Assessment & Plan Note (Signed)
 Encouraged on a high-protein diet.  Weight is down after recent acute illness

## 2023-10-29 NOTE — Assessment & Plan Note (Signed)
 Continue on oxygen to maintain O2 saturations greater than 88 to 90%  Plan Patient Instructions  Finish prednisone taper today as planned and then resume Prednisone 5mg  daily .  Set up CT chest for next month as planned  Continue on Trelegy daily  , rinse after use.  Robitussin DM As needed  for cough/congestion .  Albuterol inhaler/neb As needed   Flutter valve Twice daily   Continue on Prednisone 5mg  daily  Continue on Daliresp daily  Continue on Oxygen 2l/m at rest and 3 L with activity Activity as tolerated.  High protein diet .  Follow up with Dr. Francine Graven or Kamerin Grumbine NP in 3 months and As needed  (30 min slot)

## 2023-11-12 ENCOUNTER — Inpatient Hospital Stay (HOSPITAL_BASED_OUTPATIENT_CLINIC_OR_DEPARTMENT_OTHER)
Admission: EM | Admit: 2023-11-12 | Discharge: 2023-11-14 | DRG: 190 | Disposition: A | Discharge status: Home Health | Attending: Internal Medicine | Admitting: Internal Medicine

## 2023-11-12 ENCOUNTER — Inpatient Hospital Stay (HOSPITAL_COMMUNITY)

## 2023-11-12 ENCOUNTER — Emergency Department (HOSPITAL_BASED_OUTPATIENT_CLINIC_OR_DEPARTMENT_OTHER)

## 2023-11-12 ENCOUNTER — Other Ambulatory Visit: Payer: Self-pay

## 2023-11-12 ENCOUNTER — Encounter (HOSPITAL_BASED_OUTPATIENT_CLINIC_OR_DEPARTMENT_OTHER): Payer: Self-pay

## 2023-11-12 DIAGNOSIS — E119 Type 2 diabetes mellitus without complications: Secondary | ICD-10-CM | POA: Diagnosis present

## 2023-11-12 DIAGNOSIS — Z79890 Hormone replacement therapy (postmenopausal): Secondary | ICD-10-CM

## 2023-11-12 DIAGNOSIS — J121 Respiratory syncytial virus pneumonia: Secondary | ICD-10-CM

## 2023-11-12 DIAGNOSIS — Z7983 Long term (current) use of bisphosphonates: Secondary | ICD-10-CM

## 2023-11-12 DIAGNOSIS — Z9981 Dependence on supplemental oxygen: Secondary | ICD-10-CM

## 2023-11-12 DIAGNOSIS — Z79899 Other long term (current) drug therapy: Secondary | ICD-10-CM

## 2023-11-12 DIAGNOSIS — Z87891 Personal history of nicotine dependence: Secondary | ICD-10-CM

## 2023-11-12 DIAGNOSIS — E039 Hypothyroidism, unspecified: Secondary | ICD-10-CM | POA: Diagnosis present

## 2023-11-12 DIAGNOSIS — Z825 Family history of asthma and other chronic lower respiratory diseases: Secondary | ICD-10-CM

## 2023-11-12 DIAGNOSIS — Z7952 Long term (current) use of systemic steroids: Secondary | ICD-10-CM

## 2023-11-12 DIAGNOSIS — F419 Anxiety disorder, unspecified: Secondary | ICD-10-CM | POA: Diagnosis present

## 2023-11-12 DIAGNOSIS — J9621 Acute and chronic respiratory failure with hypoxia: Secondary | ICD-10-CM | POA: Diagnosis present

## 2023-11-12 DIAGNOSIS — D473 Essential (hemorrhagic) thrombocythemia: Secondary | ICD-10-CM | POA: Diagnosis present

## 2023-11-12 DIAGNOSIS — E785 Hyperlipidemia, unspecified: Secondary | ICD-10-CM | POA: Diagnosis present

## 2023-11-12 DIAGNOSIS — R54 Age-related physical debility: Secondary | ICD-10-CM | POA: Diagnosis present

## 2023-11-12 DIAGNOSIS — J439 Emphysema, unspecified: Secondary | ICD-10-CM | POA: Diagnosis present

## 2023-11-12 DIAGNOSIS — Z681 Body mass index (BMI) 19 or less, adult: Secondary | ICD-10-CM | POA: Diagnosis not present

## 2023-11-12 DIAGNOSIS — Z8673 Personal history of transient ischemic attack (TIA), and cerebral infarction without residual deficits: Secondary | ICD-10-CM | POA: Diagnosis not present

## 2023-11-12 DIAGNOSIS — R64 Cachexia: Secondary | ICD-10-CM | POA: Diagnosis present

## 2023-11-12 DIAGNOSIS — R0602 Shortness of breath: Secondary | ICD-10-CM | POA: Diagnosis present

## 2023-11-12 DIAGNOSIS — Z8249 Family history of ischemic heart disease and other diseases of the circulatory system: Secondary | ICD-10-CM | POA: Diagnosis not present

## 2023-11-12 DIAGNOSIS — Z888 Allergy status to other drugs, medicaments and biological substances status: Secondary | ICD-10-CM

## 2023-11-12 DIAGNOSIS — J9601 Acute respiratory failure with hypoxia: Secondary | ICD-10-CM | POA: Diagnosis not present

## 2023-11-12 DIAGNOSIS — J441 Chronic obstructive pulmonary disease with (acute) exacerbation: Principal | ICD-10-CM | POA: Diagnosis present

## 2023-11-12 DIAGNOSIS — I10 Essential (primary) hypertension: Secondary | ICD-10-CM | POA: Diagnosis present

## 2023-11-12 DIAGNOSIS — Z7951 Long term (current) use of inhaled steroids: Secondary | ICD-10-CM | POA: Diagnosis not present

## 2023-11-12 LAB — RESPIRATORY PANEL BY PCR

## 2023-11-12 LAB — CBC WITH DIFFERENTIAL/PLATELET
Abs Immature Granulocytes: 0.02 10*3/uL (ref 0.00–0.07)
Basophils Absolute: 0 10*3/uL (ref 0.0–0.1)
Basophils Relative: 1 %
Eosinophils Absolute: 0.1 10*3/uL (ref 0.0–0.5)
Eosinophils Relative: 1 %
HCT: 38.8 % (ref 36.0–46.0)
Hemoglobin: 12.6 g/dL (ref 12.0–15.0)
Immature Granulocytes: 1 %
Lymphocytes Relative: 25 %
Lymphs Abs: 0.9 10*3/uL (ref 0.7–4.0)
MCH: 31.2 pg (ref 26.0–34.0)
MCHC: 32.5 g/dL (ref 30.0–36.0)
MCV: 96 fL (ref 80.0–100.0)
Monocytes Absolute: 0.3 10*3/uL (ref 0.1–1.0)
Monocytes Relative: 9 %
Neutro Abs: 2.3 10*3/uL (ref 1.7–7.7)
Neutrophils Relative %: 63 %
Platelets: 283 10*3/uL (ref 150–400)
RBC: 4.04 MIL/uL (ref 3.87–5.11)
RDW: 15.1 % (ref 11.5–15.5)
WBC: 3.6 10*3/uL — ABNORMAL LOW (ref 4.0–10.5)
nRBC: 0 % (ref 0.0–0.2)

## 2023-11-12 LAB — RESP PANEL BY RT-PCR (RSV, FLU A&B, COVID)  RVPGX2
Influenza A by PCR: NEGATIVE
Influenza B by PCR: NEGATIVE
Resp Syncytial Virus by PCR: NEGATIVE
SARS Coronavirus 2 by RT PCR: NEGATIVE

## 2023-11-12 LAB — BASIC METABOLIC PANEL
Anion gap: 10 (ref 5–15)
BUN: 14 mg/dL (ref 8–23)
CO2: 29 mmol/L (ref 22–32)
Calcium: 8.9 mg/dL (ref 8.9–10.3)
Chloride: 96 mmol/L — ABNORMAL LOW (ref 98–111)
Creatinine, Ser: 0.93 mg/dL (ref 0.44–1.00)
GFR, Estimated: >60 mL/min (ref >60)
Glucose, Bld: 89 mg/dL (ref 70–99)
Potassium: 4.4 mmol/L (ref 3.5–5.1)
Sodium: 135 mmol/L (ref 135–145)

## 2023-11-12 LAB — D-DIMER, QUANTITATIVE: D-Dimer, Quant: 0.69 ug{FEU}/mL — ABNORMAL HIGH (ref 0.00–0.50)

## 2023-11-12 MED ORDER — ACETAMINOPHEN 650 MG RE SUPP: 650.0000 mg | Freq: Four times a day (QID) | RECTAL | Status: DC | PRN

## 2023-11-12 MED ORDER — LEVOTHYROXINE SODIUM 75 MCG PO TABS
75.0000 ug | ORAL_TABLET | Freq: Every morning | ORAL | Status: DC
  Administered 2023-11-13 – 2023-11-14 (×2): 75 ug via ORAL
  Filled 2023-11-12 (×2): qty 3

## 2023-11-12 MED ORDER — PREDNISONE 50 MG PO TABS
60.0000 mg | ORAL_TABLET | Freq: Once | ORAL | Status: AC
  Administered 2023-11-12: 60 mg via ORAL
  Filled 2023-11-12: qty 1

## 2023-11-12 MED ORDER — HYDROXYZINE HCL 25 MG PO TABS: 25.0000 mg | ORAL_TABLET | Freq: Four times a day (QID) | ORAL | Status: DC | PRN

## 2023-11-12 MED ORDER — LEVOTHYROXINE SODIUM 75 MCG PO TABS: 75.0000 ug | ORAL_TABLET | Freq: Every morning | ORAL | Status: DC

## 2023-11-12 MED ORDER — AMITRIPTYLINE HCL 25 MG PO TABS
50.0000 mg | ORAL_TABLET | Freq: Every day | ORAL | Status: DC
  Administered 2023-11-12 – 2023-11-13 (×2): 50 mg via ORAL
  Filled 2023-11-12 (×2): qty 2

## 2023-11-12 MED ORDER — ROFLUMILAST 500 MCG PO TABS
500.0000 ug | ORAL_TABLET | Freq: Every day | ORAL | Status: DC
  Administered 2023-11-13 – 2023-11-14 (×2): 500 ug via ORAL
  Filled 2023-11-12 (×2): qty 1

## 2023-11-12 MED ORDER — PREDNISONE 20 MG PO TABS
40.0000 mg | ORAL_TABLET | Freq: Every day | ORAL | Status: DC
  Administered 2023-11-13 – 2023-11-14 (×2): 40 mg via ORAL
  Filled 2023-11-12 (×2): qty 2

## 2023-11-12 MED ORDER — UMECLIDINIUM BROMIDE 62.5 MCG/ACT IN AEPB
1.0000 | INHALATION_SPRAY | Freq: Every day | RESPIRATORY_TRACT | Status: DC
  Administered 2023-11-13 – 2023-11-14 (×2): 1 via RESPIRATORY_TRACT
  Filled 2023-11-12: qty 7

## 2023-11-12 MED ORDER — ONDANSETRON HCL 4 MG/2ML IJ SOLN: 4.0000 mg | Freq: Four times a day (QID) | INTRAMUSCULAR | Status: DC | PRN

## 2023-11-12 MED ORDER — ALBUTEROL (5 MG/ML) CONTINUOUS INHALATION SOLN: 10.0000 mg/h | INHALATION_SOLUTION | Freq: Once | RESPIRATORY_TRACT | Status: DC

## 2023-11-12 MED ORDER — ALBUTEROL SULFATE (2.5 MG/3ML) 0.083% IN NEBU: 2.5000 mg | INHALATION_SOLUTION | RESPIRATORY_TRACT | Status: DC | PRN

## 2023-11-12 MED ORDER — ACETAMINOPHEN 325 MG PO TABS: 650.0000 mg | ORAL_TABLET | Freq: Four times a day (QID) | ORAL | Status: DC | PRN

## 2023-11-12 MED ORDER — ONDANSETRON HCL 4 MG PO TABS: 4.0000 mg | ORAL_TABLET | Freq: Four times a day (QID) | ORAL | Status: DC | PRN

## 2023-11-12 MED ORDER — SODIUM CHLORIDE (PF) 0.9 % IJ SOLN
INTRAMUSCULAR | Status: AC
  Filled 2023-11-12: qty 50

## 2023-11-12 MED ORDER — METOPROLOL SUCCINATE ER 25 MG PO TB24
25.0000 mg | ORAL_TABLET | Freq: Every day | ORAL | Status: DC
  Administered 2023-11-13: 25 mg via ORAL
  Filled 2023-11-12: qty 1

## 2023-11-12 MED ORDER — FLUTICASONE FUROATE-VILANTEROL 100-25 MCG/ACT IN AEPB
1.0000 | INHALATION_SPRAY | Freq: Every day | RESPIRATORY_TRACT | Status: DC
  Administered 2023-11-13 – 2023-11-14 (×2): 1 via RESPIRATORY_TRACT
  Filled 2023-11-12: qty 28

## 2023-11-12 MED ORDER — ALBUTEROL SULFATE (2.5 MG/3ML) 0.083% IN NEBU
INHALATION_SOLUTION | RESPIRATORY_TRACT | Status: AC
  Administered 2023-11-12: 10 mg
  Filled 2023-11-12: qty 12

## 2023-11-12 MED ORDER — HYDROXYZINE HCL 25 MG PO TABS: 25.0000 mg | ORAL_TABLET | Freq: Once | ORAL | Status: DC

## 2023-11-12 MED ORDER — IOHEXOL 350 MG/ML SOLN
100.0000 mL | Freq: Once | INTRAVENOUS | Status: AC | PRN
  Administered 2023-11-12: 75 mL via INTRAVENOUS

## 2023-11-12 MED ORDER — ASPIRIN 81 MG PO TBEC
81.0000 mg | DELAYED_RELEASE_TABLET | Freq: Every day | ORAL | Status: DC
  Administered 2023-11-13 – 2023-11-14 (×2): 81 mg via ORAL
  Filled 2023-11-12 (×2): qty 1

## 2023-11-12 MED ORDER — ENOXAPARIN SODIUM 30 MG/0.3ML IJ SOSY
30.0000 mg | PREFILLED_SYRINGE | INTRAMUSCULAR | Status: DC
  Administered 2023-11-13: 30 mg via SUBCUTANEOUS
  Filled 2023-11-12: qty 0.3

## 2023-11-12 MED ORDER — CYCLOBENZAPRINE HCL 5 MG PO TABS
5.0000 mg | ORAL_TABLET | Freq: Two times a day (BID) | ORAL | Status: DC
  Administered 2023-11-12 – 2023-11-14 (×4): 5 mg via ORAL
  Filled 2023-11-12 (×4): qty 1

## 2023-11-12 MED ORDER — TRAZODONE HCL 50 MG PO TABS: 25.0000 mg | ORAL_TABLET | Freq: Every evening | ORAL | Status: DC | PRN

## 2023-11-12 NOTE — Consult Note (Signed)
 NAME:  Cheryl Haley, MRN:  [id number], DOB:  13-Aug-1956, LOS: 0 ADMISSION DATE:  11/12/2023, CONSULTATION DATE: 11/12/2023 REFERRING MD: Dr. Kirby Crigler, CHIEF COMPLAINT: Hypoxemic respiratory failure  History of Present Illness:  Asked to see patient for worsening shortness of breath  1 day history of feeling more short of breath than usual, desaturating very easily with any activity  Usually on 2 to 3 L of oxygen, has been on oxygen for over 8 years Was recently hospitalized about a month ago for exacerbation of COPD, RSV infection Appears to have stabilized and back to her usual state Compliant with medications Has had some cough but not out of the usual, not bringing up anything, has not been around anybody's had an acute illness  Pertinent  Medical History   Past Medical History:  Diagnosis Date   Agatston coronary artery calcium score greater than 400    score 678   Anxiety    Arthritis    "in neck"   COPD (chronic obstructive pulmonary disease) (HCC)    Diabetes (HCC)    Dyspnea    Emphysema lung (HCC)    Essential thrombocythemia (HCC) 09/15/2019   History of hiatal hernia    Hx of TIA (transient ischemic attack) and stroke    Hyperlipidemia    Hypertension    Hypothyroidism    Impaired fasting glucose    Pleural effusion 05/15/2014   CXR 05/2014:  New small right effusion.     Pneumonia    Protein-calorie malnutrition, severe 09/17/2018   Pulmonary nodules 07/14/2018   07/2017 CT chest >pulmonary nodules noted.  07/2017 PET scan -Hypermetabolic LUL nodule  12/2017 CT chest >decreased nodule size, new pulmonary nodules noted.  FOB -atypical cells  CT chest 07/2018 >stable nodules to smaller on established nodules . 2 new nodules RUL , RLL >CT chest 3 months    Thyroid disease    Tobacco abuse 11/28/2015   Smoked 2 packs of cigarettes daily from teenage years until age 56, quit in 2016    Unspecified hypothyroidism 09/20/2013   Viral respiratory infection  09/16/2018     Significant Hospital Events: Including procedures, antibiotic start and stop dates in addition to other pertinent events   PFT-severe obstructive disease  Interim History / Subjective:  Worsening shortness of breath of about a few days duration Desaturating easily with any activity  Objective   Blood pressure 107/60, pulse (!) 10, temperature 98.3 F (36.8 C), temperature source Oral, resp. rate 20, height 5\' 4"  (1.626 m), weight 44.9 kg, SpO2 94%.       No intake or output data in the 24 hours ending 11/12/23 1335 Filed Weights   11/12/23 0559  Weight: 44.9 kg    Examination: General: Elderly, does not appear to be in acute distress, frail HENT: Moist oral mucosa Lungs: Poor air movement bilaterally Cardiovascular: S1-S2 appreciated Abdomen: Soft, bowel sounds appreciated Extremities: No edema Neuro: Alert and oriented x 3 GU:    PFT 02/16/2023 with very severe obstructive disease, severe reduction in diffusing capacity  Resolved Hospital Problem list     Assessment & Plan:  Chronic obstructive pulmonary disease with exacerbation Stage IV COPD with exacerbation  Acute on chronic respiratory failure  With acute decompensation in symptoms -Recent hospitalization -Pulm embolism needs to be considered  If no pulmonary embolism, will need repeat echocardiogram to assess right-sided function -Last echocardiogram was in December 2021  Was recently hospitalized for RSV/COPD exacerbation but appears to be making significant progress -Not completely back  to baseline but symptoms appear to be quite acute  Chronic obstructive pulmonary disease -Continue bronchodilators -On Breo/Incruse, Daliresp long-term  Agree with respiratory viral panel  Will order CT PE  Virl Diamond, MD [facility] Pager: See Loretha Stapler

## 2023-11-12 NOTE — ED Notes (Signed)
 Called [facility] for transport to [facility].  Spoke with [employer]

## 2023-11-12 NOTE — H&P (Signed)
 History and Physical  Cheryl Haley ZOX:[id number] DOB: 1956/08/17 DOA: 11/12/2023  PCP: [facility]   Chief Complaint: Dyspnea with exertion  HPI: Cheryl Haley is a 68 y.o. female with medical history significant for anxiety, severe emphysema on chronic 2 to 3 L nasal cannula oxygen, hypertension, hyperlipidemia, hypothyroidism recent hospitalization 1 month ago for acute COPD exacerbation in the setting of RSV infection who is being admitted to the hospital with recurrent dyspnea and hypoxia with exertion.  States that after her most recent hospitalization about 1 month ago, she feels like she got a little bit better, completed a course of increased prednisone, and is now back on her baseline 5 mg p.o. daily.  She remains on Breztri twice daily, as well as Daliresp.  Per most recent pulmonology notes on 2/27, she remains a little weak after hospitalization but even on that day she complained of increased cough.  Upon our discussion today, she denies any productive cough, hemoptysis, chest pain, orthopnea, lower extremity swelling.  No fevers, chills, or sick contacts.  States that at baseline she does get a little bit short of breath with ambulation but is able to complete all her ADLs.  However over the last few days, and this morning in the ER, she has severe dyspnea even walking 8-10 steps she gets incredibly short of breath.  She was noted to be saturating 82% on her baseline 2 L nasal cannula oxygen today in the ER, with ambulation.  She was given oral steroids, breathing treatments and hospitalist admission was requested.  Review of Systems: Please see HPI for pertinent positives and negatives. A complete 10 system review of systems are otherwise negative.  Past Medical History:  Diagnosis Date   Agatston coronary artery calcium score greater than 400    score 678   Anxiety    Arthritis    "in neck"   COPD (chronic obstructive pulmonary disease) (HCC)    Diabetes  (HCC)    Dyspnea    Emphysema lung (HCC)    Essential thrombocythemia (HCC) 09/15/2019   History of hiatal hernia    Hx of TIA (transient ischemic attack) and stroke    Hyperlipidemia    Hypertension    Hypothyroidism    Impaired fasting glucose    Pleural effusion 05/15/2014   CXR 05/2014:  New small right effusion.     Pneumonia    Protein-calorie malnutrition, severe 09/17/2018   Pulmonary nodules 07/14/2018   07/2017 CT chest >pulmonary nodules noted.  07/2017 PET scan -Hypermetabolic LUL nodule  12/2017 CT chest >decreased nodule size, new pulmonary nodules noted.  FOB -atypical cells  CT chest 07/2018 >stable nodules to smaller on established nodules . 2 new nodules RUL , RLL >CT chest 3 months    Thyroid disease    Tobacco abuse 11/28/2015   Smoked 2 packs of cigarettes daily from teenage years until age 34, quit in 2016    Unspecified hypothyroidism 09/20/2013   Viral respiratory infection 09/16/2018   Past Surgical History:  Procedure Laterality Date   BRONCHIAL BIOPSY  08/20/2021   Procedure: BRONCHIAL BIOPSIES;  Surgeon: Josephine Igo, DO;  Location: [facility];  Service: Pulmonary;;   BRONCHIAL BRUSHINGS  08/20/2021   Procedure: BRONCHIAL BRUSHINGS;  Surgeon: Josephine Igo, DO;  Location: [facility];  Service: Pulmonary;;   BRONCHIAL NEEDLE ASPIRATION BIOPSY  08/20/2021   Procedure: BRONCHIAL NEEDLE ASPIRATION BIOPSIES;  Surgeon: Josephine Igo, DO;  Location: [facility];  Service: Pulmonary;;   BRONCHIAL WASHINGS  08/20/2021   Procedure: BRONCHIAL WASHINGS;  Surgeon: Josephine Igo, DO;  Location: [facility];  Service: Pulmonary;;   COLONOSCOPY WITH PROPOFOL N/A 04/06/2023   Procedure: COLONOSCOPY WITH PROPOFOL;  Surgeon: Imogene Burn, MD;  Location: [facility];  Service: Gastroenterology;  Laterality: N/A;   ESOPHAGOGASTRODUODENOSCOPY     EYE SURGERY     FIDUCIAL MARKER PLACEMENT  08/20/2021   Procedure: FIDUCIAL MARKER PLACEMENT;  Surgeon: Josephine Igo, DO;  Location: [facility];  Service: Pulmonary;;   POLYPECTOMY  04/06/2023   Procedure: POLYPECTOMY;  Surgeon: Imogene Burn, MD;  Location: [facility];  Service: Gastroenterology;;   VIDEO BRONCHOSCOPY WITH ENDOBRONCHIAL NAVIGATION Left 07/29/2017   Procedure: VIDEO BRONCHOSCOPY WITH ENDOBRONCHIAL NAVIGATION;  Surgeon: Leslye Peer, MD;  Location: [facility];  Service: Thoracic;  Laterality: Left;   VIDEO BRONCHOSCOPY WITH RADIAL ENDOBRONCHIAL ULTRASOUND  08/20/2021   Procedure: RADIAL ENDOBRONCHIAL ULTRASOUND;  Surgeon: Josephine Igo, DO;  Location: [facility];  Service: Pulmonary;;   Social History:  reports that she quit smoking about 6 years ago. Her smoking use included cigarettes. She started smoking about 46 years ago. She has a 80 pack-year smoking history. She has never used smokeless tobacco. She reports that she does not currently use alcohol. She reports that she does not use drugs.  Allergies  Allergen Reactions   Bisoprolol Fumarate Other (See Comments)    Other reaction(s): heart issues    Family History  Problem Relation Age of Onset   Heart disease Mother    Emphysema Father    Heart disease Father    Emphysema Sister    Emphysema Sister    Heart disease Sister    Congestive Heart Failure Sister    Congestive Heart Failure Sister    Cancer Sister        lung   Cancer Sister        breast   Emphysema Brother    Heart disease Brother    Heart disease Brother    Liver disease Neg Hx    Esophageal cancer Neg Hx    Colon cancer Neg Hx      Prior to Admission medications   Medication Sig Start Date End Date Taking? Authorizing Provider  albuterol (PROVENTIL) (2.5 MG/3ML) 0.083% nebulizer solution Take 3 mLs (2.5 mg total) by nebulization every 6 (six) hours as needed for wheezing or shortness of breath. 10/28/23 10/27/24  Parrett, Virgel Bouquet, NP  albuterol (VENTOLIN HFA) 108 (90 Base) MCG/ACT inhaler Inhale 2 puffs into the lungs every 6 (six) hours as  needed for wheezing or shortness of breath. 04/14/22   Josephine Igo, DO  alendronate (FOSAMAX) 70 MG tablet Take 70 mg by mouth every Saturday. 06/13/21   [provider]  amitriptyline (ELAVIL) 50 MG tablet Take 50 mg by mouth at bedtime.    [provider]  aspirin EC 81 MG tablet Take 81 mg daily by mouth.     [provider]  benzonatate (TESSALON) 100 MG capsule Take 1 capsule (100 mg total) by mouth every 8 (eight) hours. 10/20/23   Ghimire, Werner Lean, MD  cyclobenzaprine (FLEXERIL) 5 MG tablet Take 5 mg by mouth in the morning and at bedtime. 04/20/23   [provider]  DALIRESP 500 MCG TABS tablet Take 1 tablet by mouth once daily 11/10/22   Kalman Shan, MD  Evolocumab (REPATHA SURECLICK) 140 MG/ML SOAJ Inject 140 mg into the skin every 14 (fourteen) days. 08/21/23   Armanda Magic  R, MD  Fluticasone-Umeclidin-Vilant (TRELEGY ELLIPTA) 100-62.5-25 MCG/ACT AEPB Inhale 1 puff into the lungs daily. 08/21/23   Icard, Rachel Bo, DO  hydroxyurea (HYDREA) 500 MG capsule TAKE ONE CAPSULE BY MOUTH EVERY OTHER DAY WITH FOOD TO MINIMIZE GI SIDE EFFECTS 10/12/23   Rushie Chestnut, PA-C  hydrOXYzine (ATARAX) 25 MG tablet Take 1 tablet (25 mg total) by mouth every 6 (six) hours as needed for anxiety. 10/20/23   Ghimire, Werner Lean, MD  levothyroxine (SYNTHROID) 75 MCG tablet Take 1 tablet (75 mcg total) by mouth every morning. 10/20/23   Ghimire, Werner Lean, MD  metoprolol succinate (TOPROL-XL) 25 MG 24 hr tablet Take 1 tablet (25 mg total) by mouth daily. 09/15/22   Quintella Reichert, MD  Multiple Vitamin (MULTIVITAMIN WITH MINERALS) TABS tablet Take 1 tablet by mouth daily.    [provider]  OXYGEN Inhale 2 L into the lungs continuous.    [provider]  roflumilast (DALIRESP) 500 MCG TABS tablet Take 1 tablet (500 mcg total) by mouth daily. 09/08/23   Parrett, Virgel Bouquet, NP    Physical Exam: BP 107/60 (BP Location: Left Arm)   Pulse (!) 10   Temp  98.3 F (36.8 C) (Oral)   Resp 20   Ht 5\' 4"  (1.626 m)   Wt 44.9 kg   SpO2 94%   BMI 16.99 kg/m  General:  Alert, oriented x4, calm, in no acute distress, able to speak in full sentences but gets winded.  Wearing 2 L nasal cannula oxygen. Cardiovascular: RRR, no murmurs or rubs, no peripheral edema  Respiratory: Breath sounds are very tight, she has almost no air movement audibly.  No crackles, no wheezes.  Not tachypneic, no respiratory distress except a little bit of dyspnea when speaking. Abdomen: soft, nontender, nondistended, normal bowel tones heard  Skin: dry, no rashes  Musculoskeletal: no joint effusions, normal range of motion  Psychiatric: appropriate affect, normal speech  Neurologic: extraocular muscles intact, clear speech, moving all extremities with intact sensorium         Labs on Admission:  Basic Metabolic Panel: Recent Labs  Lab 11/12/23 0635  NA 135  K 4.4  CL 96*  CO2 29  GLUCOSE 89  BUN 14  CREATININE 0.93  CALCIUM 8.9   Liver Function Tests: No results for input(s): "AST", "ALT", "ALKPHOS", "BILITOT", "PROT", "ALBUMIN" in the last 168 hours. No results for input(s): "LIPASE", "AMYLASE" in the last 168 hours. No results for input(s): "AMMONIA" in the last 168 hours. CBC: Recent Labs  Lab 11/12/23 0635  WBC 3.6*  NEUTROABS 2.3  HGB 12.6  HCT 38.8  MCV 96.0  PLT 283   Cardiac Enzymes: No results for input(s): "CKTOTAL", "CKMB", "CKMBINDEX", "TROPONINI" in the last 168 hours. BNP (last 3 results) Recent Labs    01/26/23 0849  BNP 28.1    ProBNP (last 3 results) No results for input(s): "PROBNP" in the last 8760 hours.  CBG: No results for input(s): "GLUCAP" in the last 168 hours.  Radiological Exams on Admission: DG Chest Portable 1 View Result Date: 11/12/2023 CLINICAL DATA:  Shortness of breath EXAM: PORTABLE CHEST 1 VIEW COMPARISON:  10/16/2023 FINDINGS: Advanced emphysema. Nodule with fiducial marker overlapping the right upper  lung, known. Asymmetric bandlike opacity and lucency at the right apex which is stable. There is no edema, consolidation, effusion, or pneumothorax. Normal heart size and mediastinal contours given rotation. Artifact from EKG leads. IMPRESSION: No acute finding when compared to prior.  Advanced  COPD. Electronically Signed   By: Tiburcio Pea M.D.   On: 11/12/2023 06:29   Assessment/Plan Tovah Slavick is a 68 y.o. female with medical history significant for anxiety, severe emphysema on chronic 2 to 3 L nasal cannula oxygen, hypertension, hyperlipidemia, hypothyroidism recent hospitalization 1 month ago for acute COPD exacerbation in the setting of RSV infection who is being admitted to the hospital with recurrent dyspnea and hypoxia with exertion.  Acute exacerbation of COPD-with increased weakness, cough, significant dyspnea and hypoxia with minimal exertion.  She is afebrile, without evidence of acute infection.  No fever, no leukocytosis. -Inpatient admission -Continue supplemental oxygen -Continue Breo/Incruse and Daliresp -Will continue oral steroids for now, due to the lack of active wheezing -Discussed with Dr. Wynona Neat, pulmonology consultation requested -Check 20 panel viral respiratory panel, with appropriate precautions -Will check D-dimer  Hypertension-continue Toprol-XL  Hypothyroidism-continue Synthroid  Anxiety-continue Elavil at bedtime, and Atarax as needed  DVT prophylaxis: Lovenox     Code Status: Full Code  Consults called: Pulmonology  Admission status: The appropriate patient status for this patient is INPATIENT. Inpatient status is judged to be reasonable and necessary in order to provide the required intensity of service to ensure the patient's safety. The patient's presenting symptoms, physical exam findings, and initial radiographic and laboratory data in the context of their chronic comorbidities is felt to place them at high risk for further clinical  deterioration. Furthermore, it is not anticipated that the patient will be medically stable for discharge from the hospital within 2 midnights of admission.    I certify that at the point of admission it is my clinical judgment that the patient will require inpatient hospital care spanning beyond 2 midnights from the point of admission due to high intensity of service, high risk for further deterioration and high frequency of surveillance required  Time spent: 52 minutes  Maleke Feria Sharlette Dense MD [facility] Pager [phone]  If 7PM-7AM, please contact night-coverage [email or web address] Password [facility]  11/12/2023, 12:52 PM

## 2023-11-12 NOTE — ED Provider Notes (Signed)
 [facility] Provider Note   CSN: [id number] Arrival date & time: 11/12/23  0548     History  Chief Complaint  Patient presents with   Shortness of Breath    Cheryl Haley is a 68 y.o. female.  The history is provided by the patient and a relative.   Patient with history of COPD, chronic respiratory failure on 3 L nasal cannula, previous history of tobacco use, hypertension presents with increasing shortness of breath.  Patient reports over the past week she has had increasing dyspnea on exertion and fatigue.  She has very little energy to do any work at home.  She reports she can't even pick up her small dog.  Over 2 weeks ago she was at her baseline and was able to move with her oxygen.  No fevers or vomiting.  No chest pain.  There is no hemoptysis.  She is no longer smoking. She takes all of her medications as prescribed, recently finished a taper of prednisone and now on daily prednisone 5 mg   Past Medical History:  Diagnosis Date   Agatston coronary artery calcium score greater than 400    score 678   Anxiety    Arthritis    "in neck"   COPD (chronic obstructive pulmonary disease) (HCC)    Diabetes (HCC)    Dyspnea    Emphysema lung (HCC)    Essential thrombocythemia (HCC) 09/15/2019   History of hiatal hernia    Hx of TIA (transient ischemic attack) and stroke    Hyperlipidemia    Hypertension    Hypothyroidism    Impaired fasting glucose    Pleural effusion 05/15/2014   CXR 05/2014:  New small right effusion.     Pneumonia    Protein-calorie malnutrition, severe 09/17/2018   Pulmonary nodules 07/14/2018   07/2017 CT chest >pulmonary nodules noted.  07/2017 PET scan -Hypermetabolic LUL nodule  12/2017 CT chest >decreased nodule size, new pulmonary nodules noted.  FOB -atypical cells  CT chest 07/2018 >stable nodules to smaller on established nodules . 2 new nodules RUL , RLL >CT chest 3 months    Thyroid disease     Tobacco abuse 11/28/2015   Smoked 2 packs of cigarettes daily from teenage years until age 1, quit in 2016    Unspecified hypothyroidism 09/20/2013   Viral respiratory infection 09/16/2018    Home Medications Prior to Admission medications   Medication Sig Start Date End Date Taking? Authorizing Provider  albuterol (PROVENTIL) (2.5 MG/3ML) 0.083% nebulizer solution Take 3 mLs (2.5 mg total) by nebulization every 6 (six) hours as needed for wheezing or shortness of breath. 10/28/23 10/27/24  Parrett, Virgel Bouquet, NP  albuterol (VENTOLIN HFA) 108 (90 Base) MCG/ACT inhaler Inhale 2 puffs into the lungs every 6 (six) hours as needed for wheezing or shortness of breath. 04/14/22   Josephine Igo, DO  alendronate (FOSAMAX) 70 MG tablet Take 70 mg by mouth every Saturday. 06/13/21   [provider]  amitriptyline (ELAVIL) 50 MG tablet Take 50 mg by mouth at bedtime.    [provider]  aspirin EC 81 MG tablet Take 81 mg daily by mouth.     [provider]  benzonatate (TESSALON) 100 MG capsule Take 1 capsule (100 mg total) by mouth every 8 (eight) hours. 10/20/23   Ghimire, Werner Lean, MD  cyclobenzaprine (FLEXERIL) 5 MG tablet Take 5 mg by mouth in the morning and at bedtime. 04/20/23  [provider]  DALIRESP 500 MCG TABS tablet Take 1 tablet by mouth once daily 11/10/22   Kalman Shan, MD  Evolocumab (REPATHA SURECLICK) 140 MG/ML SOAJ Inject 140 mg into the skin every 14 (fourteen) days. 08/21/23   Quintella Reichert, MD  Fluticasone-Umeclidin-Vilant (TRELEGY ELLIPTA) 100-62.5-25 MCG/ACT AEPB Inhale 1 puff into the lungs daily. 08/21/23   Icard, Rachel Bo, DO  hydroxyurea (HYDREA) 500 MG capsule TAKE ONE CAPSULE BY MOUTH EVERY OTHER DAY WITH FOOD TO MINIMIZE GI SIDE EFFECTS 10/12/23   Rushie Chestnut, PA-C  hydrOXYzine (ATARAX) 25 MG tablet Take 1 tablet (25 mg total) by mouth every 6 (six) hours as needed for anxiety. 10/20/23   Ghimire, Werner Lean, MD  levothyroxine  (SYNTHROID) 75 MCG tablet Take 1 tablet (75 mcg total) by mouth every morning. 10/20/23   Ghimire, Werner Lean, MD  metoprolol succinate (TOPROL-XL) 25 MG 24 hr tablet Take 1 tablet (25 mg total) by mouth daily. 09/15/22   Quintella Reichert, MD  Multiple Vitamin (MULTIVITAMIN WITH MINERALS) TABS tablet Take 1 tablet by mouth daily.    [provider]  OXYGEN Inhale 2 L into the lungs continuous.    [provider]  roflumilast (DALIRESP) 500 MCG TABS tablet Take 1 tablet (500 mcg total) by mouth daily. 09/08/23   Parrett, Virgel Bouquet, NP      Allergies    Bisoprolol fumarate    Review of Systems   Review of Systems  Constitutional:  Positive for fatigue. Negative for fever.  Respiratory:  Positive for cough, shortness of breath and wheezing.   Cardiovascular:  Negative for chest pain and leg swelling.  Gastrointestinal:  Negative for diarrhea and vomiting.    Physical Exam Updated Vital Signs BP (!) 102/52   Pulse 83   Temp 98 F (36.7 C)   Resp (!) 27   Ht 1.626 m (5\' 4" )   Wt 44.9 kg   SpO2 99%   BMI 16.99 kg/m  Physical Exam CONSTITUTIONAL: Chronically ill-appearing, cachectic HEAD: Normocephalic/atraumatic ENMT: Mucous membranes moist NECK: supple no meningeal signs CV: S1/S2 noted, no murmurs/rubs/gallops noted LUNGS: Mild tachypnea, scattered wheeze bilaterally, decreased breath sounds bilaterally, on 2 L nasal cannula ABDOMEN: soft, nontender NEURO: Pt is awake/alert/appropriate, moves all extremitiesx4.  No facial droop.   EXTREMITIES: pulses normal/equal, full ROM Distal pulses equal and intact.  No lower extremity edema.  Chronic bruising to her lower extremity SKIN: warm, color normal  ED Results / Procedures / Treatments   Labs (all labs ordered are listed, but only abnormal results are displayed) Labs Reviewed  RESP PANEL BY RT-PCR (RSV, FLU A&B, COVID)  [id number]  CBC WITH DIFFERENTIAL/PLATELET  BASIC METABOLIC PANEL    EKG EKG  Interpretation Date/Time:  Thursday November 12 2023 06:02:26 EDT Ventricular Rate:  86 PR Interval:  149 QRS Duration:  114 QT Interval:  371 QTC Calculation: 444 R Axis:   94  Text Interpretation: Sinus rhythm RAE, consider biatrial enlargement Interpretation limited secondary to artifact Confirmed by Zadie Rhine ([id number]) on 11/12/2023 6:06:45 AM  Radiology DG Chest Portable 1 View Result Date: 11/12/2023 CLINICAL DATA:  Shortness of breath EXAM: PORTABLE CHEST 1 VIEW COMPARISON:  10/16/2023 FINDINGS: Advanced emphysema. Nodule with fiducial marker overlapping the right upper lung, known. Asymmetric bandlike opacity and lucency at the right apex which is stable. There is no edema, consolidation, effusion, or pneumothorax. Normal heart size and mediastinal contours given rotation. Artifact from EKG leads. IMPRESSION: No acute finding when compared  to prior.  Advanced COPD. Electronically Signed   By: Tiburcio Pea M.D.   On: 11/12/2023 06:29    Procedures .Critical Care  Performed by: Zadie Rhine, MD Authorized by: Zadie Rhine, MD   Critical care provider statement:    Critical care time (minutes):  35   Critical care start time:  11/12/2023 6:15 AM   Critical care end time:  11/12/2023 6:50 AM   Critical care time was exclusive of:  Separately billable procedures and treating other patients   Critical care was necessary to treat or prevent imminent or life-threatening deterioration of the following conditions:  Respiratory failure and cardiac failure   Critical care was time spent personally by me on the following activities:  Examination of patient, evaluation of patient's response to treatment, development of treatment plan with patient or surrogate, pulse oximetry, ordering and review of radiographic studies, ordering and review of laboratory studies, ordering and performing treatments and interventions, re-evaluation of patient's condition and review of old charts   I  assumed direction of critical care for this patient from another provider in my specialty: no       Medications Ordered in ED Medications  albuterol (PROVENTIL,VENTOLIN) solution continuous neb (10 mg/hr Nebulization Not Given 11/12/23 0638)  predniSONE (DELTASONE) tablet 60 mg (60 mg Oral Given 11/12/23 0628)  albuterol (PROVENTIL) (2.5 MG/3ML) 0.083% nebulizer solution (10 mg  Given 11/12/23 1610)    ED Course/ Medical Decision Making/ A&P Clinical Course as of 11/12/23 0654  Thu Nov 12, 2023  0636 Patient with history of COPD and chronic respiratory failure presents with increasing shortness of breath and dyspnea on exertion over the past week.  Reports tonight she felt more out of breath at home.  She reports her occupational therapist told her yesterday that she had abnormal lung sounds. While at rest here she is in no acute distress, but reports feeling very fatigued upon standing and short of breath.  Will start with continuous nebulized therapy. [DW]  9604 Pt stable, receiving neb treatment Xray negative Signed out to dr Theresia Lo at shift change to reassess patient  [DW]    Clinical Course User Index [DW] Zadie Rhine, MD                                 Medical Decision Making Amount and/or Complexity of Data Reviewed Labs: ordered. Radiology: ordered. ECG/medicine tests: ordered.   This patient presents to the ED for concern of shortness of breath, this involves an extensive number of treatment options, and is a complaint that carries with it a high risk of complications and morbidity.  The differential diagnosis includes but is not limited to Acute coronary syndrome, pneumonia, acute pulmonary edema, pneumothorax, acute anemia, pulmonary embolism COPD exacerbation  Comorbidities that complicate the patient evaluation: Patient's presentation is complicated by their history of COPD and chronic respiratory failure  Social Determinants of Health: Patient's  former  tobacco use   increases the complexity of managing their presentation  Additional history obtained: Additional history obtained from  sister with whom she lives with Records reviewed previous admission documents and pulmonology notes reviewed    Imaging Studies ordered: I ordered imaging studies including X-ray chest   I independently visualized and interpreted imaging which showed COPD findings, no acute findings I agree with the radiologist interpretation  Cardiac Monitoring: The patient was maintained on a cardiac monitor.  I personally viewed and interpreted the cardiac  monitor which showed an underlying rhythm of:  sinus rhythm  Medicines ordered and prescription drug management: I ordered medication including hour-long continuous nebulizer treatment for wheezing and COPD Reevaluation of the patient after these medicines showed that the patient    improved   Critical Interventions:   nebulizer therapy  Reevaluation: After the interventions noted above, I reevaluated the patient and found that they have :improved  Complexity of problems addressed: Patient's presentation is most consistent with  acute presentation with potential threat to life or bodily function           Final Clinical Impression(s) / ED Diagnoses Final diagnoses:  COPD exacerbation [facility])    Rx / DC Orders ED Discharge Orders     None         Zadie Rhine, MD 11/12/23 [id number]

## 2023-11-12 NOTE — ED Triage Notes (Signed)
 Shortness of breath increasing over the past week.   Hx COPD wears 2L [place] at baseline. Last couple days has required increased 3L [place].   On arrival pt breathing more labored and unable to speak full sentences.   Nonproductive cough. Using home albuterol as directed with minimal improvement. Recent admission in February.   2L [place] ~85.

## 2023-11-12 NOTE — ED Provider Notes (Signed)
 Patient signed out to me at 0700 by Dr. Bebe Shaggy pending labs and reassessment after nebs. In short this is a 68 year old female with PMH COPD on 2-3 L home O2, HTN that presented with worsening shortness of breath. Of note, did have recent hospitalization last month for COPD exacerbation and acute on chronic hypoxic respiratory failure. She was tachypneic but otherwise hemodynamically stable on arrival with wheezing and tight breath sounds. She was given steroids and started on 1 hr long albuterol neb. CXR shows no acute disease, labs are within normal range, EKG without acute ischemic changes.  7:34 AM Upon reassessment, the patient reports significant improvement after completing her hour-long neb.  She does have trace expiratory wheeze at the apices otherwise lungs sound clear.  Will have ambulatory trial to determine if she is ready for discharge home.  7:49 AM Patient attempted ambulatory trial, desatting to 82% on 2L with worsening SOB. She is agreeable for admission for her COPD exacerbation.   Rexford Maus, DO 11/12/23 [phone]

## 2023-11-12 NOTE — ED Notes (Signed)
 RT ambulated patient with pulse ox. Patient wears 2L at home and walks with walker, so RN went with [place]. We did not make it to the door before patient became very SOB and SAT 82%. Walked back to bed and placed on 4L long enough to get SAT back to 92%. Weaned back to 2L. MD aware.

## 2023-11-13 DIAGNOSIS — J441 Chronic obstructive pulmonary disease with (acute) exacerbation: Secondary | ICD-10-CM | POA: Diagnosis not present

## 2023-11-13 DIAGNOSIS — J9601 Acute respiratory failure with hypoxia: Secondary | ICD-10-CM

## 2023-11-13 LAB — CBC
HCT: 35.9 % — ABNORMAL LOW (ref 36.0–46.0)
Hemoglobin: 11.3 g/dL — ABNORMAL LOW (ref 12.0–15.0)
MCH: 31.2 pg (ref 26.0–34.0)
MCHC: 31.5 g/dL (ref 30.0–36.0)
MCV: 99.2 fL (ref 80.0–100.0)
Platelets: 265 10*3/uL (ref 150–400)
RBC: 3.62 MIL/uL — ABNORMAL LOW (ref 3.87–5.11)
RDW: 15 % (ref 11.5–15.5)
WBC: 4.3 10*3/uL (ref 4.0–10.5)
nRBC: 0 % (ref 0.0–0.2)

## 2023-11-13 LAB — BASIC METABOLIC PANEL
Anion gap: 9 (ref 5–15)
BUN: 18 mg/dL (ref 8–23)
CO2: 30 mmol/L (ref 22–32)
Calcium: 9 mg/dL (ref 8.9–10.3)
Chloride: 100 mmol/L (ref 98–111)
Creatinine, Ser: 0.67 mg/dL (ref 0.44–1.00)
GFR, Estimated: >60 mL/min (ref >60)
Glucose, Bld: 86 mg/dL (ref 70–99)
Potassium: 3.9 mmol/L (ref 3.5–5.1)
Sodium: 139 mmol/L (ref 135–145)

## 2023-11-13 NOTE — Progress Notes (Signed)
 [facility] Progress Note  Patient: Cheryl Haley     ZOX:[id number]  DOA: 11/12/2023   PCP: [facility]       Brief hospital course: This is a 68 year old female with severe COPD and chronic respiratory failure who is on 2 to 3 L of oxygen continuously, hypertension, anxiety who presents to the hospital for dyspnea on exertion.  She states that she had an RSV infection about 2 weeks ago and since then has had hypoxia on exertion.  She notes that her pulse ox dropped into the 80s when she tries to ambulate.  She also notes severe shortness of breath and inability to ambulate as far she was previously doing because of the dyspnea.  Subjective:  Has ongoing chronic cough which is productive.  No other complaints  Assessment and Plan: Principal Problem:   Acute respiratory failure with hypoxia - Admitted in February for COPD exacerbation secondary to RSV - Has become increasingly deconditioned since this infection which may be contributing to dyspnea on exertion - CT scan of the chest is negative for PE but does reveal severe emphysema - Have ordered incentive spirometry-I suspect she will need pulmonary rehabilitation  Active Problems:   COPD with acute exacerbation (HCC) -No wheezing on my exam - Continue to optimize pulmonary medications - Will need to obtain a pulse ox with exertion to decide on how much oxygen she will require upon being discharged  Essential thrombocythemia - Follows with hematology as outpatient  Of note, the patient is on Toprol-BP is not elevated therefore we will hold    Code Status: Full Code Total time on patient care: 35 minutes DVT prophylaxis:  enoxaparin (LOVENOX) injection 30 mg Start: 11/12/23 1330     Objective:   Vitals:   11/12/23 2022 11/13/23 0608 11/13/23 0859 11/13/23 0946  BP: 116/63 (!) 91/51  (!) 112/52  Pulse: 95 87  93  Resp: 18 18    Temp: 98 F (36.7 C) 98.2 F (36.8 C)    TempSrc: Oral Oral     SpO2: 97% 97% 98% 96%  Weight:      Height:       Filed Weights   11/12/23 0559  Weight: 44.9 kg   Exam: General exam: Appears comfortable  HEENT: oral mucosa moist Respiratory system: Mild bilateral rhonchi.  Has a cough with congestion.  On 4 L of O2 on exam with a pulse ox in the 90s. Cardiovascular system: S1 & S2 heard  Gastrointestinal system: Abdomen soft, non-tender, nondistended. Normal bowel sounds   Extremities: No cyanosis, clubbing or edema Psychiatry:  Mood & affect appropriate.      CBC: Recent Labs  Lab 11/12/23 0635 11/13/23 0618  WBC 3.6* 4.3  NEUTROABS 2.3  --   HGB 12.6 11.3*  HCT 38.8 35.9*  MCV 96.0 99.2  PLT 283 265   Basic Metabolic Panel: Recent Labs  Lab 11/12/23 0635 11/13/23 0618  NA 135 139  K 4.4 3.9  CL 96* 100  CO2 29 30  GLUCOSE 89 86  BUN 14 18  CREATININE 0.93 0.67  CALCIUM 8.9 9.0     Scheduled Meds:  amitriptyline  50 mg Oral QHS   aspirin EC  81 mg Oral Daily   cyclobenzaprine  5 mg Oral BID   enoxaparin (LOVENOX) injection  30 mg Subcutaneous Q24H   fluticasone furoate-vilanterol  1 puff Inhalation Daily   And   umeclidinium bromide  1 puff Inhalation Daily   hydrOXYzine  25  mg Oral Once   levothyroxine  75 mcg Oral q morning   metoprolol succinate  25 mg Oral Daily   predniSONE  40 mg Oral Q breakfast   roflumilast  500 mcg Oral Daily    Imaging and lab data personally reviewed   Author: Calvert Cantor  11/13/2023 12:51 PM  To contact [facility]>   Check the care team in [facility] and look for the attending/consulting [facility] provider listed  Log into [email or web address] and use [place]'s universal password   Go to> "[facility]"  and find provider  If you still have difficulty reaching the provider, please page the [facility] (Director on Call) for the Hospitalists listed on amion

## 2023-11-13 NOTE — Progress Notes (Signed)
 SATURATION QUALIFICATIONS: (This note is used to comply with regulatory documentation for home oxygen)  Patient Saturations on Room Air at Rest =98%  Patient Saturations on Room Air while Ambulating = 81%  Patient Saturations on 3 Liters of oxygen while Ambulating =89 %  Please briefly explain why patient needs home oxygen: Patient felt tired quickly while walking on Room Air

## 2023-11-13 NOTE — Progress Notes (Signed)
 NAME:  Cheryl Haley, MRN:  [id number], DOB:  07-11-1956, LOS: 1 ADMISSION DATE:  11/12/2023, CONSULTATION DATE: 11/12/2023 REFERRING MD: Dr. Kirby Crigler, CHIEF COMPLAINT: Hypoxemic respiratory failure  History of Present Illness:  Asked to see patient for worsening shortness of breath  1 day history of feeling more short of breath than usual, desaturating very easily with any activity  Usually on 2 to 3 L of oxygen, has been on oxygen for over 8 years Was recently hospitalized about a month ago for exacerbation of COPD, RSV infection Appears to have stabilized and back to her usual state Compliant with medications Has had some cough but not out of the usual, not bringing up anything, has not been around anybody's had an acute illness  Pertinent  Medical History   Past Medical History:  Diagnosis Date   Agatston coronary artery calcium score greater than 400    score 678   Anxiety    Arthritis    "in neck"   COPD (chronic obstructive pulmonary disease) (HCC)    Diabetes (HCC)    Dyspnea    Emphysema lung (HCC)    Essential thrombocythemia (HCC) 09/15/2019   History of hiatal hernia    Hx of TIA (transient ischemic attack) and stroke    Hyperlipidemia    Hypertension    Hypothyroidism    Impaired fasting glucose    Pleural effusion 05/15/2014   CXR 05/2014:  New small right effusion.     Pneumonia    Protein-calorie malnutrition, severe 09/17/2018   Pulmonary nodules 07/14/2018   07/2017 CT chest >pulmonary nodules noted.  07/2017 PET scan -Hypermetabolic LUL nodule  12/2017 CT chest >decreased nodule size, new pulmonary nodules noted.  FOB -atypical cells  CT chest 07/2018 >stable nodules to smaller on established nodules . 2 new nodules RUL , RLL >CT chest 3 months    Thyroid disease    Tobacco abuse 11/28/2015   Smoked 2 packs of cigarettes daily from teenage years until age 26, quit in 2016    Unspecified hypothyroidism 09/20/2013   Viral respiratory infection  09/16/2018     Significant Hospital Events: Including procedures, antibiotic start and stop dates in addition to other pertinent events   PFT-severe obstructive disease CT angiogram chest negative for PE  Interim History / Subjective:   Afebrile Continues to complain of shortness of breath On 3 L nasal cannula  Objective   Blood pressure (!) 112/52, pulse 93, temperature 98.2 F (36.8 C), temperature source Oral, resp. rate 18, height 5\' 4"  (1.626 m), weight 44.9 kg, SpO2 96%.        Intake/Output Summary (Last 24 hours) at 11/13/2023 1433 Last data filed at 11/13/2023 0900 Gross per 24 hour  Intake 240 ml  Output --  Net 240 ml   Filed Weights   11/12/23 0559  Weight: 44.9 kg    Examination: General: Elderly, does not appear to be in acute distress, frail HENT: Moist oral mucosa Lungs: Decreased breath sounds bilateral, no accessory muscle use Cardiovascular: S1-S2 appreciated Abdomen: Soft, bowel sounds appreciated Extremities: No edema Neuro: Alert and oriented x 3 GU:    PFT 02/16/2023 with very severe obstructive disease, severe reduction in diffusing capacity  Resolved Hospital Problem list     Assessment & Plan:  Chronic obstructive pulmonary disease with exacerbation Stage IV COPD with exacerbation  Acute on chronic respiratory failure  With acute decompensation in symptoms, CTA negative for PE, RVP negative Was recently hospitalized for RSV/COPD exacerbation but appears to  be making significant progress -Not completely back to baseline but symptoms appear to be quite acute -Prednisone taper over 2 weeks  Chronic obstructive pulmonary disease -Continue bronchodilators -On Breo/Incruse inlieu of home trelegy, Daliresp long-term -Consider adding Ohtuwayre as outpatient   PCCM will be available as needed  Cyril Mourning MD. FCCP. [facility] Pager : [phone]  If no response to pager , please call [phone] until 7 pm After  7:00 pm call Elink  [phone]   11/13/2023

## 2023-11-13 NOTE — Plan of Care (Signed)

## 2023-11-14 ENCOUNTER — Other Ambulatory Visit: Payer: Self-pay | Admitting: Pulmonary Disease

## 2023-11-14 DIAGNOSIS — J9601 Acute respiratory failure with hypoxia: Secondary | ICD-10-CM | POA: Diagnosis not present

## 2023-11-14 MED ORDER — PREDNISONE 10 MG PO TABS
ORAL_TABLET | ORAL | 0 refills | Status: AC
Start: 2023-11-15 — End: 2023-11-30

## 2023-11-14 NOTE — Plan of Care (Signed)

## 2023-11-14 NOTE — Progress Notes (Signed)
 Pt automatic BP 99/48 (61) and HR of 97% on room. Manuel blood pressure 92/52. Provider, Saima Rizman,MD notified and stated she is okay with pt discharging. This nurse recommended a small bolus before discharge and stated not being comfortable discharging pt with an unstable blood pressure. Provider asked if pt was symptomatic. Pt denied any symptoms, no pain, no dizziness, etc while ambulated. Provider stated she is still okay with pt discharging.  Pt sitting on the side of the bed talking top her sister, no complaints. Rise and fall of chest observed, without distress, call light within reach, bed in the lowest position and preparing pt for discharge.

## 2023-11-14 NOTE — Discharge Summary (Signed)
 Physician Discharge Summary  Cheryl Haley OZH:[id number] DOB: 1955/09/29 DOA: 11/12/2023  PCP: [facility]  Admit date: 11/12/2023 Discharge date: 11/14/2023 Discharging to: home Recommendations for Outpatient Follow-up:  Will need pulmonary rehab  Consults:  PCCM Procedures:  none   Discharge Diagnoses:   Principal Problem:   Acute respiratory failure with hypoxia (HCC) Active Problems:   COPD with acute exacerbation [facility])  Brief hospital course: This is a 68 year old female with severe COPD and chronic respiratory failure who is on 2 to 3 L of oxygen continuously, hypertension, anxiety who presents to the hospital for dyspnea on exertion.  She states that she had an RSV infection about 2 weeks ago and since then has had hypoxia on exertion.  She notes that her pulse ox dropped into the 80s when she tries to ambulate.  She also notes severe shortness of breath and inability to ambulate as far she was previously doing because of the dyspnea.   Subjective:  Dyspnea improving. Has chronic cough but not severe.   Assessment and Plan: Principal Problem:   Acute respiratory failure with hypoxia - Admitted in February for COPD exacerbation secondary to RSV - Has become increasingly deconditioned since this infection which may be contributing to dyspnea on exertion - CT scan of the chest is negative for PE but does reveal severe emphysema - Have ordered incentive spirometry-I suspect she will need pulmonary rehabilitation which I have ordered - Needs 3 L O2 on exertion to maintain pulse ox > 90    Active Problems:   COPD with acute exacerbation (HCC) -No wheezing on my exam - Continue to optimize pulmonary medications - will dc with a prolonged steroid taper   Essential thrombocythemia - Follows with hematology as outpatient   Of note, the patient is on Toprol-BP is not elevated therefore we will hold            Discharge Instructions  Discharge  Instructions     AMB referral to pulmonary rehabilitation   Complete by: As directed    Please select a program: Pulmonary Rehabilitation   Diagnosis: (See process instructions below for COPD requirements): COPD-Gold 4: Very Severe FEV1 <30% predicted   Program Prescription: O2 Administration by RT, EP, or RN if SpO2<88%   After initial evaluation and assessments completed: Virtual Based Care may be provided alone or in conjunction with Pulmonary Rehab/Respiratory Care services based on patient barriers.: Yes   Increase activity slowly   Complete by: As directed       Allergies as of 11/14/2023       Reactions   Bisoprolol Fumarate Other (See Comments)   Other reaction(s): heart issues        Medication List     STOP taking these medications    metoprolol succinate 25 MG 24 hr tablet Commonly known as: TOPROL-XL       TAKE these medications    albuterol 108 (90 Base) MCG/ACT inhaler Commonly known as: VENTOLIN HFA Inhale 2 puffs into the lungs every 6 (six) hours as needed for wheezing or shortness of breath.   albuterol (2.5 MG/3ML) 0.083% nebulizer solution Commonly known as: PROVENTIL Take 3 mLs (2.5 mg total) by nebulization every 6 (six) hours as needed for wheezing or shortness of breath.   alendronate 70 MG tablet Commonly known as: FOSAMAX Take 70 mg by mouth every Saturday.   amitriptyline 50 MG tablet Commonly known as: ELAVIL Take 50 mg by mouth at bedtime.   aspirin EC 81 MG tablet  Take 81 mg daily by mouth.   benzonatate 100 MG capsule Commonly known as: TESSALON Take 1 capsule (100 mg total) by mouth every 8 (eight) hours. What changed: Another medication with the same name was removed. Continue taking this medication, and follow the directions you see here.   busPIRone 7.5 MG tablet Commonly known as: BUSPAR Take 7.5 mg by mouth 2 (two) times daily.   cyclobenzaprine 5 MG tablet Commonly known as: FLEXERIL Take 5 mg by mouth in the  morning and at bedtime.   hydroxyurea 500 MG capsule Commonly known as: HYDREA TAKE ONE CAPSULE BY MOUTH EVERY OTHER DAY WITH FOOD TO MINIMIZE GI SIDE EFFECTS   hydrOXYzine 25 MG tablet Commonly known as: ATARAX Take 1 tablet (25 mg total) by mouth every 6 (six) hours as needed for anxiety.   levothyroxine 75 MCG tablet Commonly known as: SYNTHROID Take 1 tablet (75 mcg total) by mouth every morning.   multivitamin with minerals Tabs tablet Take 1 tablet by mouth daily.   OXYGEN Inhale 2 L into the lungs continuous.   predniSONE 10 MG tablet Commonly known as: DELTASONE Take 4 tablets (40 mg total) by mouth daily with breakfast for 3 days, THEN 3 tablets (30 mg total) daily with breakfast for 3 days, THEN 2 tablets (20 mg total) daily with breakfast for 3 days, THEN 1 tablet (10 mg total) daily with breakfast for 3 days, THEN 0.5 tablets (5 mg total) daily with breakfast for 3 days. Start taking on: November 15, 2023 What changed:  medication strength See the new instructions.   Repatha SureClick 140 MG/ML Soaj Generic drug: Evolocumab Inject 140 mg into the skin every 14 (fourteen) days.   roflumilast 500 MCG Tabs tablet Commonly known as: DALIRESP Take 1 tablet (500 mcg total) by mouth daily.   sulfamethoxazole-trimethoprim 800-160 MG tablet Commonly known as: BACTRIM DS Take 1 tablet by mouth 2 (two) times daily.   Trelegy Ellipta 100-62.5-25 MCG/ACT Aepb Generic drug: Fluticasone-Umeclidin-Vilant Inhale 1 puff into the lungs daily.        Follow-up Information     [facility]. Call in 3 days.   Specialty: Rheumatology Contact information: [address] [place] [address] [phone]                    The results of significant diagnostics from this hospitalization (including imaging, microbiology, ancillary and laboratory) are listed below for reference.    CT Angio Chest Pulmonary Embolism (PE) W or WO  Contrast Result Date: 11/12/2023 CLINICAL DATA:  Acute respiratory failure EXAM: CT ANGIOGRAPHY CHEST WITH CONTRAST TECHNIQUE: Multidetector CT imaging of the chest was performed using the standard protocol during bolus administration of intravenous contrast. Multiplanar CT image reconstructions and MIPs were obtained to evaluate the vascular anatomy. RADIATION DOSE REDUCTION: This exam was performed according to the departmental dose-optimization program which includes automated exposure control, adjustment of the mA and/or kV according to patient size and/or use of iterative reconstruction technique. CONTRAST:  75mL OMNIPAQUE IOHEXOL 350 MG/ML SOLN COMPARISON:  CT 05/20/2023, 01/26/2023, 02/26/2022 FINDINGS: Cardiovascular: Satisfactory opacification of the pulmonary arteries to the segmental level. No evidence of pulmonary embolism. Moderate aortic atherosclerosis. No aneurysm or dissection. Coronary vascular calcification. Normal cardiac size. Small pericardial effusion Mediastinum/Nodes: Patent trachea. No thyroid mass. No suspicious lymph nodes. Mild circumferential distal esophageal thickening. Lungs/Pleura: Advanced emphysema. Mild bronchiectasis in the right upper lobe. Regression of right upper lobe pulmonary density, appears more bandlike today and suggests scarring.  Fiducial marker adjacent to pulmonary nodular focus in the right upper lobe is stable to slightly regressed as well. No new airspace disease. Upper Abdomen: No acute finding. Musculoskeletal: No acute or suspicious osseous abnormality. Degenerative changes Review of the MIP images confirms the above findings. IMPRESSION: 1. Negative for acute pulmonary embolus or aortic dissection. 2. Advanced emphysema. Regression of right upper lobe pulmonary density, appears more bandlike today and suggests scarring. Fiducial marker adjacent to pulmonary nodular focus in the right upper lobe is stable to slightly regressed as well. No new airspace  disease. 3. Aortic atherosclerosis. Electronically Signed   By: Jasmine Pang M.D.   On: 11/12/2023 17:52   DG Chest Portable 1 View Result Date: 11/12/2023 CLINICAL DATA:  Shortness of breath EXAM: PORTABLE CHEST 1 VIEW COMPARISON:  10/16/2023 FINDINGS: Advanced emphysema. Nodule with fiducial marker overlapping the right upper lung, known. Asymmetric bandlike opacity and lucency at the right apex which is stable. There is no edema, consolidation, effusion, or pneumothorax. Normal heart size and mediastinal contours given rotation. Artifact from EKG leads. IMPRESSION: No acute finding when compared to prior.  Advanced COPD. Electronically Signed   By: Tiburcio Pea M.D.   On: 11/12/2023 06:29   DG Chest Portable 1 View Result Date: 10/16/2023 CLINICAL DATA:  Cough. EXAM: PORTABLE CHEST 1 VIEW COMPARISON:  10/14/2023. FINDINGS: Bilateral lungs appear hyperexpanded and hyperlucent with coarse bronchovascular markings, in keeping with COPD. There is stable scarring overlying the right lung apex. Bilateral lungs otherwise appear clear. No dense consolidation or lung collapse. Apparent blunting of right lateral costophrenic angle is similar to the prior study and favored to represent pleural thickening. Left lateral costophrenic angle is clear. Normal cardio-mediastinal silhouette. No acute osseous abnormalities. The soft tissues are within normal limits. IMPRESSION: *No acute cardiopulmonary abnormality.  COPD. Electronically Signed   By: Jules Schick M.D.   On: 10/16/2023 11:28   Labs:   Basic Metabolic Panel: Recent Labs  Lab 11/12/23 0635 11/13/23 0618  NA 135 139  K 4.4 3.9  CL 96* 100  CO2 29 30  GLUCOSE 89 86  BUN 14 18  CREATININE 0.93 0.67  CALCIUM 8.9 9.0     CBC: Recent Labs  Lab 11/12/23 0635 11/13/23 0618  WBC 3.6* 4.3  NEUTROABS 2.3  --   HGB 12.6 11.3*  HCT 38.8 35.9*  MCV 96.0 99.2  PLT 283 265         SIGNED:   Calvert Cantor, MD  [facility] 11/14/2023, 10:55 AM Time taking on discharge: 50 minutes

## 2023-11-14 NOTE — Progress Notes (Signed)
   11/14/23 1116  TOC Brief Assessment  Insurance and Status Reviewed  Patient has primary care physician No  Home environment has been reviewed Resides in single family home with relatives  Prior level of function: Independent with ADLs at baseline  Prior/Current Home Services No current home services  Social Drivers of Health Review SDOH reviewed no interventions necessary  Readmission risk has been reviewed Yes  Transition of care needs no transition of care needs at this time

## 2023-11-15 ENCOUNTER — Other Ambulatory Visit: Payer: Self-pay | Admitting: Cardiology

## 2023-11-15 ENCOUNTER — Other Ambulatory Visit: Payer: Self-pay | Admitting: Pulmonary Disease

## 2023-11-16 ENCOUNTER — Telehealth: Payer: Self-pay

## 2023-11-16 NOTE — Transitions of Care (Post Inpatient/ED Visit) (Signed)
   11/16/2023  Name: Cheryl Haley MRN: [id number] DOB: 1956-04-18  Today's TOC FU Call Status: Today's TOC FU Call Status:: Unsuccessful Call (1st Attempt)  Attempted to reach the patient regarding the most recent Inpatient/ED visit.  Follow Up Plan: Additional outreach attempts will be made to reach the patient to complete the Transitions of Care (Post Inpatient/ED visit) call.  Deidre Ala, BSN, RN [place]  VBCI - [employer] [facility] RN Care Manager [phone]

## 2023-11-17 ENCOUNTER — Telehealth: Payer: Self-pay

## 2023-11-17 NOTE — Transitions of Care (Post Inpatient/ED Visit) (Signed)
   11/17/2023  Name: Nathifa Ritthaler MRN: [id number] DOB: 10/03/1955  Today's TOC FU Call Status: Today's TOC FU Call Status:: Unsuccessful Call (2nd Attempt) Unsuccessful Call (2nd Attempt) Date: 11/17/23  Attempted to reach the patient regarding the most recent Inpatient/ED visit.  Follow Up Plan: Additional outreach attempts will be made to reach the patient to complete the Transitions of Care (Post Inpatient/ED visit) call.   Deidre Ala, BSN, RN [place]  [facility]BCI - [employer] [facility] RN Care Manager [phone]

## 2023-11-17 NOTE — Transitions of Care (Post Inpatient/ED Visit) (Signed)
 11/17/2023  Name: Cheryl Haley MRN: [id number] DOB: June 05, 1956  Today's TOC FU Call Status: Today's TOC FU Call Status:: Successful TOC FU Call Completed TOC FU Call Complete Date: 11/17/23 Patient's Name and Date of Birth confirmed.  Transition Care Management Follow-up Telephone Call Date of Discharge: 11/14/23 Discharge Facility: [facility]) Type of Discharge: Inpatient Admission Primary Inpatient Discharge Diagnosis:: COPD exacerbation How have you been since you were released from the hospital?: Better Any questions or concerns?: No  Items Reviewed: Did you receive and understand the discharge instructions provided?: Yes Medications obtained,verified, and reconciled?: Yes (Medications Reviewed) Any new allergies since your discharge?: No Dietary orders reviewed?: Yes Type of Diet Ordered:: Low sodium Heart Healthy Do you have support at home?: Yes People in Home: sibling(s) Name of Support/Comfort Primary Source: Mamie Nick  Medications Reviewed Today: Medications Reviewed Today     Reviewed by Redge Gainer, RN (Case Manager) on 11/17/23 at 1212  Med List Status: <None>   Medication Order Taking? Sig Documenting Provider Last Dose Status Informant  albuterol (PROVENTIL) (2.5 MG/3ML) 0.083% nebulizer solution [id number]  Take 3 mLs (2.5 mg total) by nebulization every 6 (six) hours as needed for wheezing or shortness of breath. Parrett, Virgel Bouquet, NP  Active Self, Pharmacy Records  albuterol (VENTOLIN HFA) 108 (90 Base) MCG/ACT inhaler [id number]  Inhale 2 puffs into the lungs every 6 (six) hours as needed for wheezing or shortness of breath. Josephine Igo, DO  Active Self, Pharmacy Records  alendronate (FOSAMAX) 70 MG tablet [id number]  Take 70 mg by mouth every Saturday. [provider]  Active Self, Pharmacy Records  amitriptyline (ELAVIL) 50 MG tablet [id number]  Take 50 mg by mouth at bedtime. [provider]  Active Self, Pharmacy Records   aspirin EC 81 MG tablet [id number]  Take 81 mg daily by mouth.  [provider]  Active Self, Pharmacy Records  benzonatate (TESSALON) 100 MG capsule [id number]  Take 1 capsule (100 mg total) by mouth every 8 (eight) hours.  Patient not taking: Reported on 11/12/2023   Maretta Bees, MD  Active Self, Pharmacy Records  busPIRone (BUSPAR) 7.5 MG tablet [id number]  Take 7.5 mg by mouth 2 (two) times daily. [provider]  Active Self, Pharmacy Records  cyclobenzaprine (FLEXERIL) 5 MG tablet [id number]  Take 5 mg by mouth in the morning and at bedtime. [provider]  Active Self, Pharmacy Records  Evolocumab [facility] SURECLICK) 140 MG/ML Ivory Broad [id number]  Inject 140 mg into the skin every 14 (fourteen) days. Quintella Reichert, MD  Active Self, Pharmacy Records  Fluticasone-Umeclidin-Vilant [facility] ELLIPTA) 100-62.5-25 MCG/ACT AEPB [id number]  TAKE 1 PUFF BY MOUTH EVERY DAY Parrett, Tammy S, NP  Active   hydroxyurea (HYDREA) 500 MG capsule [id number]  TAKE ONE CAPSULE BY MOUTH EVERY OTHER DAY WITH FOOD TO MINIMIZE GI SIDE EFFECTS Rushie Chestnut, PA-C  Active Self, Pharmacy Records  hydrOXYzine (ATARAX) 25 MG tablet [id number]  Take 1 tablet (25 mg total) by mouth every 6 (six) hours as needed for anxiety. Maretta Bees, MD  Active Self, Pharmacy Records           Med Note [facility] [place], SUSAN A   Thu Nov 12, 2023  3:56 PM) Unknown last dose, prn  levothyroxine (SYNTHROID) 75 MCG tablet [id number]  Take 1 tablet (75 mcg total) by mouth every morning. Maretta Bees, MD  Active Self, Pharmacy Records  Multiple Vitamin (MULTIVITAMIN WITH MINERALS) TABS tablet [id number]  Take 1  tablet by mouth daily. [provider]  Active Self, Pharmacy Records  OXYGEN [id number]  Inhale 2 L into the lungs continuous. [provider]  Active Self, Pharmacy Records  predniSONE (DELTASONE) 10 MG tablet [id number]  Take 4 tablets (40 mg total) by mouth daily with  breakfast for 3 days, THEN 3 tablets (30 mg total) daily with breakfast for 3 days, THEN 2 tablets (20 mg total) daily with breakfast for 3 days, THEN 1 tablet (10 mg total) daily with breakfast for 3 days, THEN 0.5 tablets (5 mg total) daily with breakfast for 3 days. Calvert Cantor, MD  Active   roflumilast (DALIRESP) 500 MCG TABS tablet [id number]  Take 1 tablet (500 mcg total) by mouth daily. Parrett, Virgel Bouquet, NP  Active Self, Pharmacy Records  sulfamethoxazole-trimethoprim (BACTRIM DS) 800-160 MG tablet [id number] No Take 1 tablet by mouth 2 (two) times daily.  Patient not taking: Reported on 11/17/2023   [provider] Not Taking Active Self, Pharmacy Records  Med List Note Jean Rosenthal, [place] 09/20/13 1726): [phone] [facility] physicians            Home Care and Equipment/Supplies: Were Home Health Services Ordered?: Yes Name of Home Health Agency:: [facility] Has Agency set up a time to come to your home?: Yes First Home Health Visit Date: 11/17/23 Any new equipment or medical supplies ordered?: No  Functional Questionnaire: Do you need assistance with bathing/showering or dressing?: No Do you need assistance with meal preparation?: No Do you need assistance with eating?: No Do you have difficulty maintaining continence: No Do you need assistance with getting out of bed/getting out of a chair/moving?: No Do you have difficulty managing or taking your medications?: No  Follow up appointments reviewed: PCP Follow-up appointment confirmed?: No MD Provider Line [phone] Given: No (The patient states she has an appointment in a couple of weeks) Specialist Hospital Follow-up appointment confirmed?: Yes Date of Specialist follow-up appointment?: 11/25/23 Follow-Up Specialty Provider:: Armanda Magic Do you need transportation to your follow-up appointment?: No Do you understand care options if your condition(s) worsen?: Yes-patient verbalized understanding  SDOH  Interventions Today    Flowsheet Row Most Recent Value  SDOH Interventions   Food Insecurity Interventions Intervention Not Indicated  Housing Interventions Intervention Not Indicated  Transportation Interventions Intervention Not Indicated  Utilities Interventions Intervention Not Indicated      Interventions Today    Flowsheet Row Most Recent Value  Chronic Disease   Chronic disease during today's visit Chronic Obstructive Pulmonary Disease (COPD)  General Interventions   General Interventions Discussed/Reviewed General Interventions Discussed, General Interventions Reviewed, Doctor Visits  Doctor Visits Discussed/Reviewed Doctor Visits Reviewed  Education Interventions   Education Provided Provided Education  Provided Verbal Education On Insurance Plans, Medication  Pharmacy Interventions   Pharmacy Dicussed/Reviewed Medications and their functions        Goals Addressed             This Visit's Progress    TOC Outreach       Current Barriers:  Chronic Disease Management support and education needs related to COPD   RNCM Clinical Goal(s):  Patient will work with the Care Management team over the next 30 days to address Transition of Care Barriers: Medication access Medication Management Support at home Provider appointments Home Health services Equipment/DME verbalize understanding of plan for management of COPD as evidenced by EMR, No hospital re-admission in the next 30 days  through collaboration with RN Care manager, provider, and care  team.   Interventions: Evaluation of current treatment plan related to  self management and patient's adherence to plan as established by provider   COPD Interventions:  (Status:  New goal.) Short Term Goal Advised patient to track and manage COPD triggers Provided instruction about proper use of medications used for management of COPD including inhalers Advised patient to engage in light exercise as tolerated 3-5 days a  week to aid in the the management of COPD Provided education about and advised patient to utilize infection prevention strategies to reduce risk of respiratory infection Discussed the importance of adequate rest and management of fatigue with COPD Screening for signs and symptoms of depression related to chronic disease state  Wear Oxygen continuous at 3L/min Check pulse oximeter as needed when ambulating and SOB  Patient Goals/Self-Care Activities: Participate in Transition of Care Program/Attend [facility] scheduled calls Notify RN Care Manager of TOC call rescheduling needs Take all medications as prescribed Attend all scheduled provider appointments Call pharmacy for medication refills 3-7 days in advance of running out of medications  Follow Up Plan:  Telephone follow up appointment with care management team member scheduled for:  Tuesday March 25th at 10:00am        [facility] Outreach to the patient for recent hospital stay regarding RSV that exacerbated her COPD. The patient states she is doing better but her HR is elevated at 126. Her metoprolol was stopped in the hospital due to low BP. She has an appointment on 11/25/23 with her Cardiologist to assess whether she needs to go back on it. She states her respiratory status is better but she is now on Oxygen at 3L/min. She confirmed her PCP at [facility]. She consents to the [facility] 30 day Outreach program.  Deidre Ala, BSN, RN [place]  VBCI - [facility] Health RN Care Manager [phone]

## 2023-11-19 ENCOUNTER — Telehealth: Payer: Self-pay | Admitting: Adult Health

## 2023-11-19 ENCOUNTER — Encounter: Payer: Self-pay | Admitting: *Deleted

## 2023-11-19 NOTE — Telephone Encounter (Signed)
 ATC x1.  LVM that we received her message regarding her CT scan.  Mychart message sent as well.  Tammy, Patient cancelled scheduled CT chest because she was hospitalized on 3/13 and she had a CT angio while in the hospital.  She just wanted to let you know.

## 2023-11-19 NOTE — Telephone Encounter (Signed)
 Pt states she canceled the CT scan that Ms. Parrett ordered because she had one done in the hospital. She wanted Ms. P to know.

## 2023-11-24 ENCOUNTER — Other Ambulatory Visit: Payer: Self-pay

## 2023-11-24 NOTE — Patient Outreach (Signed)
 Care Management  Transitions of Care Program Transitions of Care Post-discharge week 2  11/24/2023 Name: Cheryl Haley MRN: [id number] DOB: [birth year]  Subjective: Cheryl Haley is a 68 y.o. year old female who is a primary care patient of Georgianne Fick, MD. The Care Management team was unable to reach the patient by phone to assess and address transitions of care needs.   Plan: Additional outreach attempts will be made to reach the patient enrolled in the [facility] Program (Post Inpatient/ED Visit).  Deidre Ala, BSN, RN [place]  VBCI - [employer] [facility] RN Care Manager [phone]

## 2023-11-24 NOTE — Progress Notes (Unsigned)
 Cardiology Office  Note    Date:  11/25/2023   ID:  Cheryl Haley, DOB 1956/05/01, MRN [id number]  PCP:  Georgianne Fick, MD  Cardiologist:  Armanda Magic, MD   Chief Complaint  Patient presents with   Follow-up    Coronary Calcium, HTN, HLD, aortic atherosclerosis     History of Present Illness:  Cheryl Haley is a 68 y.o. female with a hx of HTN, COPD, HLD, tobacco abuse and palpitations.  Event monitor was normal.  She has chronic DOE related to COPD and is on O2.  She was noted to have coronary calcifications in all 3 coronaries by screening Chest CT for hx of tobacco last fall.  She underwent coronary Ca score in Feb 2021 which was high at 678 and placed her in the 97th% for age and sex matched controls and was also found to have aortic atherosclerosis.    She has chronic DOE related to emphysema and is on 2L [place] at home.  She has chronic SOB related to COPD and recently was hospitalized due to COPD exacerbation and her O2 was increased to 3L.  She is here today for followup and is doing well.  She denies any chest pain or pressure, PND, orthopnea, LE edema, dizziness, palpitations or syncope. SHe is compliant with her meds and is tolerating meds with no SE.    Past Medical History:  Diagnosis Date   Agatston coronary artery calcium score greater than 400    score 678   Anxiety    Arthritis    "in neck"   COPD (chronic obstructive pulmonary disease) (HCC)    Diabetes (HCC)    Dyspnea    Emphysema lung (HCC)    Essential thrombocythemia (HCC) 09/15/2019   History of hiatal hernia    Hx of TIA (transient ischemic attack) and stroke    Hyperlipidemia    Hypertension    Hypothyroidism    Impaired fasting glucose    Pleural effusion 05/15/2014   CXR 05/2014:  New small right effusion.     Pneumonia    Protein-calorie malnutrition, severe 09/17/2018   Pulmonary nodules 07/14/2018   07/2017 CT chest >pulmonary nodules noted.  07/2017 PET scan -Hypermetabolic LUL  nodule  12/2017 CT chest >decreased nodule size, new pulmonary nodules noted.  FOB -atypical cells  CT chest 07/2018 >stable nodules to smaller on established nodules . 2 new nodules RUL , RLL >CT chest 3 months    Thyroid disease    Tobacco abuse 11/28/2015   Smoked 2 packs of cigarettes daily from teenage years until age 38, quit in 2016    Unspecified hypothyroidism 09/20/2013   Viral respiratory infection 09/16/2018    Past Surgical History:  Procedure Laterality Date   BRONCHIAL BIOPSY  08/20/2021   Procedure: BRONCHIAL BIOPSIES;  Surgeon: Josephine Igo, DO;  Location: [facility];  Service: Pulmonary;;   BRONCHIAL BRUSHINGS  08/20/2021   Procedure: BRONCHIAL BRUSHINGS;  Surgeon: Josephine Igo, DO;  Location: [facility];  Service: Pulmonary;;   BRONCHIAL NEEDLE ASPIRATION BIOPSY  08/20/2021   Procedure: BRONCHIAL NEEDLE ASPIRATION BIOPSIES;  Surgeon: Josephine Igo, DO;  Location: [facility];  Service: Pulmonary;;   BRONCHIAL WASHINGS  08/20/2021   Procedure: BRONCHIAL WASHINGS;  Surgeon: Josephine Igo, DO;  Location: [facility];  Service: Pulmonary;;   COLONOSCOPY WITH PROPOFOL N/A 04/06/2023   Procedure: COLONOSCOPY WITH PROPOFOL;  Surgeon: Imogene Burn, MD;  Location: [facility];  Service: Gastroenterology;  Laterality: N/A;  ESOPHAGOGASTRODUODENOSCOPY     EYE SURGERY     FIDUCIAL MARKER PLACEMENT  08/20/2021   Procedure: FIDUCIAL MARKER PLACEMENT;  Surgeon: Josephine Igo, DO;  Location: [facility];  Service: Pulmonary;;   POLYPECTOMY  04/06/2023   Procedure: POLYPECTOMY;  Surgeon: Imogene Burn, MD;  Location: [facility];  Service: Gastroenterology;;   VIDEO BRONCHOSCOPY WITH ENDOBRONCHIAL NAVIGATION Left 07/29/2017   Procedure: VIDEO BRONCHOSCOPY WITH ENDOBRONCHIAL NAVIGATION;  Surgeon: Leslye Peer, MD;  Location: [facility];  Service: Thoracic;  Laterality: Left;   VIDEO BRONCHOSCOPY WITH RADIAL ENDOBRONCHIAL ULTRASOUND  08/20/2021   Procedure: RADIAL  ENDOBRONCHIAL ULTRASOUND;  Surgeon: Josephine Igo, DO;  Location: [facility];  Service: Pulmonary;;    Current Medications: Current Meds  Medication Sig   alendronate (FOSAMAX) 70 MG tablet Take 70 mg by mouth every Saturday.   amitriptyline (ELAVIL) 50 MG tablet Take 50 mg by mouth at bedtime.   aspirin EC 81 MG tablet Take 81 mg daily by mouth.    Evolocumab (REPATHA SURECLICK) 140 MG/ML SOAJ Inject 140 mg into the skin every 14 (fourteen) days.   Fluticasone-Umeclidin-Vilant (TRELEGY ELLIPTA) 100-62.5-25 MCG/ACT AEPB TAKE 1 PUFF BY MOUTH EVERY DAY   hydroxyurea (HYDREA) 500 MG capsule TAKE ONE CAPSULE BY MOUTH EVERY OTHER DAY WITH FOOD TO MINIMIZE GI SIDE EFFECTS   levothyroxine (SYNTHROID) 75 MCG tablet Take 1 tablet (75 mcg total) by mouth every morning.   Multiple Vitamin (MULTIVITAMIN WITH MINERALS) TABS tablet Take 1 tablet by mouth daily.   OXYGEN Inhale 2 L into the lungs continuous.   predniSONE (DELTASONE) 10 MG tablet Take 4 tablets (40 mg total) by mouth daily with breakfast for 3 days, THEN 3 tablets (30 mg total) daily with breakfast for 3 days, THEN 2 tablets (20 mg total) daily with breakfast for 3 days, THEN 1 tablet (10 mg total) daily with breakfast for 3 days, THEN 0.5 tablets (5 mg total) daily with breakfast for 3 days.   roflumilast (DALIRESP) 500 MCG TABS tablet Take 1 tablet (500 mcg total) by mouth daily.    Allergies:   Bisoprolol fumarate   Social History   Socioeconomic History   Marital status: Divorced    Spouse name: Not on file   Number of children: 0   Years of education: Not on file   Highest education level: 9th grade  Occupational History   Occupation: retired  Tobacco Use   Smoking status: Former    Current packs/day: 0.00    Average packs/day: 2.0 packs/day for 40.0 years (80.0 ttl pk-yrs)    Types: Cigarettes    Start date: 03/16/1977    Quit date: 03/16/2017    Years since quitting: 6.6   Smokeless tobacco: Never   Tobacco  comments:       Vaping Use   Vaping status: Never Used  Substance and Sexual Activity   Alcohol use: Not Currently   Drug use: No   Sexual activity: Not Currently  Other Topics Concern   Not on file  Social History Narrative   Jalysa lives with her sister. Mitsue likes to put puzzles together and watch tv.   Social Drivers of Corporate investment banker Strain: Not on file  Food Insecurity: No Food Insecurity (11/17/2023)   Hunger Vital Sign    Worried About Running Out of Food in the Last Year: Never true    Ran Out of Food in the Last Year: Never true  Transportation Needs: No Transportation Needs (11/17/2023)  PRAPARE - Administrator, Civil Service (Medical): No    Lack of Transportation (Non-Medical): No  Physical Activity: Not on file  Stress: Not on file  Social Connections: Socially Isolated (11/12/2023)   Social Connection and Isolation Panel [NHANES]    Frequency of Communication with Friends and Family: Three times a week    Frequency of Social Gatherings with Friends and Family: Once a week    Attends Religious Services: Never    Database administrator or Organizations: No    Attends Engineer, structural: Never    Marital Status: Divorced     Family History:  The patient's family history includes Cancer in her sister and sister; Congestive Heart Failure in her sister and sister; Emphysema in her brother, father, sister, and sister; Heart disease in her brother, brother, father, mother, and sister.   ROS:   Please see the history of present illness.    ROS All other systems reviewed and are negative.      No data to display           PHYSICAL EXAM:   VS:  BP 114/70   Pulse 78   Ht 5\' 4"  (1.626 m)   Wt 98 lb (44.5 kg)   SpO2 97%   BMI 16.82 kg/m     GEN: Well nourished, well developed in no acute distress HEENT: Normal NECK: No JVD; No carotid bruits LYMPHATICS: No lymphadenopathy CARDIAC:RRR, no murmurs, rubs,  gallops RESPIRATORY:  Clear to auscultation without rales, wheezing or rhonchi  ABDOMEN: Soft, non-tender, non-distended MUSCULOSKELETAL:  No edema; No deformity  SKIN: Warm and dry NEUROLOGIC:  Alert and oriented x 3 PSYCHIATRIC:  Normal affect  Wt Readings from Last 3 Encounters:  11/25/23 98 lb (44.5 kg)  11/12/23 99 lb (44.9 kg)  10/29/23 98 lb (44.5 kg)      Studies/Labs Reviewed:    Recent Labs: 01/26/2023: B Natriuretic Peptide 28.1 10/16/2023: ALT 15; Magnesium 1.9; TSH 0.099 11/13/2023: BUN 18; Creatinine, Ser 0.67; Hemoglobin 11.3; Platelets 265; Potassium 3.9; Sodium 139   Lipid Panel    Component Value Date/Time   CHOL 105 10/27/2022 0000   TRIG 45 10/27/2022 0000   HDL 63 10/27/2022 0000   CHOLHDL 1.7 10/27/2022 0000   LDLCALC 30 10/27/2022 0000    Additional studies/ records that were reviewed today include:  Coronary Ca score, Lexiscan myoview, 2D echo, event monitor  ASSESSMENT:    1. Agatston coronary artery calcium score greater than 400   2. Primary hypertension   3. Pure hypercholesterolemia   4. Aortic atherosclerosis (HCC)   5. Palpitations       PLAN:  In order of problems listed above:  1.  Coronary Artery Calcifications -noted on chest CT with Ca score 678 -she has chronic DOE related to COPD that is stable -Lexiscan myoview in Feb 21 showed no ischemia -she has not had any anginal sx since her last OV with me -Continue aggressive risk factor modification -continue prescription drug management qith ASA 81mg  daily and Crestor 40mg  daily with PRN refills  2.  HTN -BP controlled on exam today -continue prescription drug management with Toprol XL 25mg  daily with PRN refills  3.  HLD -LDL goal is less than 70 given coronary artery calcifications and aortic atherosclerosis  -I have personally reviewed and interpreted outside labs performed by patient's PCP which showed LDL 38, HDL 62, TAGs 60 on 07/20/23 and ALT 15 on 2/14/205 -continue  prescription drug management  with Cresetor 40mg  daily and Nexlizet 180-10mg  daily with PRN refills  4.  Aortic Atherosclerosis -continue statin  5.  Palpitations -denies any palpitations   Medication Adjustments/Labs and Tests Ordered: Current medicines are reviewed at length with the patient today.  Concerns regarding medicines are outlined above.  Medication changes, Labs and Tests ordered today are listed in the Patient Instructions below.  There are no Patient Instructions on file for this visit.   Signed, Armanda Magic, MD  11/25/2023 8:53 AM    [facility] [address] [place], [place], [place]  [address] Phone: [phone]; Fax: [phone]

## 2023-11-25 ENCOUNTER — Other Ambulatory Visit: Payer: Self-pay | Admitting: Cardiology

## 2023-11-25 ENCOUNTER — Encounter: Payer: Self-pay | Admitting: Cardiology

## 2023-11-25 ENCOUNTER — Ambulatory Visit: Payer: Medicare Other | Attending: Cardiology | Admitting: Cardiology

## 2023-11-25 VITALS — BP 114/70 | HR 78 | Ht 64.0 in | Wt 98.0 lb

## 2023-11-25 DIAGNOSIS — Z8673 Personal history of transient ischemic attack (TIA), and cerebral infarction without residual deficits: Secondary | ICD-10-CM

## 2023-11-25 DIAGNOSIS — E7800 Pure hypercholesterolemia, unspecified: Secondary | ICD-10-CM

## 2023-11-25 DIAGNOSIS — R002 Palpitations: Secondary | ICD-10-CM | POA: Diagnosis present

## 2023-11-25 DIAGNOSIS — R931 Abnormal findings on diagnostic imaging of heart and coronary circulation: Secondary | ICD-10-CM | POA: Insufficient documentation

## 2023-11-25 DIAGNOSIS — I10 Essential (primary) hypertension: Secondary | ICD-10-CM | POA: Diagnosis present

## 2023-11-25 DIAGNOSIS — I700 Atherosclerosis of aorta: Secondary | ICD-10-CM | POA: Diagnosis present

## 2023-11-25 MED ORDER — METOPROLOL SUCCINATE ER 25 MG PO TB24: 25.0000 mg | ORAL_TABLET | Freq: Every day | ORAL | 3 refills | Status: DC

## 2023-11-25 MED ORDER — REPATHA SURECLICK 140 MG/ML ~~LOC~~ SOAJ: 140.0000 mg | SUBCUTANEOUS | 3 refills | Status: AC

## 2023-11-25 MED ORDER — METOPROLOL SUCCINATE ER 25 MG PO TB24
25.0000 mg | ORAL_TABLET | Freq: Every day | ORAL | 3 refills | Status: DC
Start: 2023-11-25 — End: 2023-11-25

## 2023-11-25 MED ORDER — METOPROLOL SUCCINATE ER 25 MG PO TB24: 25.0000 mg | ORAL_TABLET | Freq: Every day | ORAL | 3 refills | Status: AC

## 2023-11-25 MED ORDER — ROSUVASTATIN CALCIUM 40 MG PO TABS: 40.0000 mg | ORAL_TABLET | Freq: Every day | ORAL | 3 refills | Status: AC

## 2023-11-25 NOTE — Telephone Encounter (Signed)
*  STAT* If patient is at the pharmacy, call can be transferred to refill team.   1. Which medications need to be refilled? (please list name of each medication and dose if known) Evolocumab (REPATHA SURECLICK) 140 MG/ML SOAJ  rosuvastatin (CRESTOR) 40 MG tablet  metoprolol succinate (TOPROL-XL) 25 MG 24 hr tablet   2. Would you like to learn more about the convenience, safety, & potential cost savings by using the [facility]? No    3. Are you open to using the [facility] (Type [facility].  ). No   4. Which pharmacy/location (including street and city if local pharmacy) is medication to be sent to? [id number] - [place], [place] - [address]    5. Do they need a 30 day or 90 day supply? 90 day

## 2023-11-25 NOTE — Addendum Note (Signed)
 Addended by: Michaelle Copas on: 11/25/2023 03:29 PM   Modules accepted: Orders

## 2023-11-25 NOTE — Telephone Encounter (Signed)
 Pt's medication metoprolol 25 mg tablets, pt take 1 tablet by mouth daily, were called into pt's pharmacy because the Rx kept failing. Talked to the pharmacist and they verbalized understanding.

## 2023-11-25 NOTE — Patient Instructions (Signed)
 Medication Instructions:  Your physician recommends that you continue on your current medications as directed. Please refer to the Current Medication list given to you today.  *If you need a refill on your cardiac medications before your next appointment, please call your pharmacy*  Lab Work: None ordered.  You may go to any [facility] Location for your lab work:  [facility] - [address] ([facility]) - [address] [phone] [address]  [place] - [address]   [place]  - [address]   [place] - [address] - [address] [employer]  - [address] [place] - [address] ([facility]   If you have labs (blood work) drawn today and your tests are completely normal, you will receive your results only by: [employer] (if you have MyChart)  If you have any lab test that is abnormal or we need to change your treatment, we will call you or send a MyChart message to review the results.  Testing/Procedures: None ordered.  Follow-Up: At [facility], you and your health needs are our priority.  As part of our continuing mission to provide you with exceptional heart care, we have created designated Provider Care Teams.  These Care Teams include your primary Cardiologist (physician) and Advanced Practice Providers (APPs -  Physician Assistants and Nurse Practitioners) who all work together to provide you with the care you need, when you need it.  Your next appointment:   1 year(s)  The format for your next appointment:   In Person  Provider:   Armanda Magic, MD           Valet parking services will be available as well.

## 2023-11-26 ENCOUNTER — Ambulatory Visit (HOSPITAL_BASED_OUTPATIENT_CLINIC_OR_DEPARTMENT_OTHER)

## 2023-11-26 NOTE — Telephone Encounter (Signed)
 NFN

## 2023-12-04 ENCOUNTER — Other Ambulatory Visit (HOSPITAL_COMMUNITY): Payer: Self-pay

## 2024-01-28 ENCOUNTER — Telehealth: Payer: Self-pay | Admitting: *Deleted

## 2024-01-28 ENCOUNTER — Ambulatory Visit (INDEPENDENT_AMBULATORY_CARE_PROVIDER_SITE_OTHER): Payer: Medicare Other | Admitting: Adult Health

## 2024-01-28 ENCOUNTER — Encounter: Payer: Self-pay | Admitting: Adult Health

## 2024-01-28 VITALS — BP 111/76 | HR 101 | Temp 97.8°F | Ht 64.0 in | Wt 98.2 lb

## 2024-01-28 DIAGNOSIS — Z87891 Personal history of nicotine dependence: Secondary | ICD-10-CM | POA: Diagnosis not present

## 2024-01-28 DIAGNOSIS — R0602 Shortness of breath: Secondary | ICD-10-CM

## 2024-01-28 DIAGNOSIS — J439 Emphysema, unspecified: Secondary | ICD-10-CM

## 2024-01-28 DIAGNOSIS — J9611 Chronic respiratory failure with hypoxia: Secondary | ICD-10-CM | POA: Diagnosis not present

## 2024-01-28 DIAGNOSIS — R918 Other nonspecific abnormal finding of lung field: Secondary | ICD-10-CM

## 2024-01-28 NOTE — Progress Notes (Addendum)
 @Patient  ID: Cheryl Haley, female    DOB: [birth year], 68 y.o.   MRN: [id number]  Chief Complaint  Patient presents with   Follow-up    Referring provider: [facility] [place] *  HPI: 68 year old female followed for COPD steroid-dependent and chronic respiratory failure on oxygen and lung nodules       TEST/EVENTS :  Chest Imaging- films reviewed: CT chest 06/28/2019- severe centrilobular emphysema throughout both lungs.  Left upper lobe spiculated nodule associated with linear scar.  Right middle lobe spiculated nodule.  Airway thickening.  Scarring in right greater than left lower lobes.  No significant mediastinal or hilar adenopathy.  Nodules stable or smaller than previous CT scans.   CT cardiac scoring 10/07/2019- redemonstrated right middle lobe nodule.  No new concerning nodules.   Interval near complete resolution of previously demonstrated lingular infiltrate, consistent with resolving pneumonia. 2. The 3 suspicious right lung nodules seen on the most recent study persist, and based on multiplicity could be postinflammatory.   PET scan 07/09/2021- Moderate metabolic activity associated with irregular nodules clustered in the RIGHT upper lobe. Favor post inflammatory or infectious process. Cannot exclude neoplasm. Recommend tissue sampling versus CT surveillance. 2. Persistent but decreased activity in LEFT upper lobe parenchymal lesion.   Navigational bronchoscopy August 28, 2021.  Cytology was negative for malignancy.  Cultures were negative.    CT chest Jan 10, 2022 showed minimally progressed right upper lobe consolidation and new nodular opacities along the lateral aspect of this consolidation questionable mucoid impaction, previous pulmonary nodules stable    PET scan 06/2022 -Hypermetabolism corresponding to posterior right upper lobe area of partially cavitary consolidation. Morphology and growth rate over multiple prior exams favor an infectious or  inflammatory etiology. Neoplasm felt less likely.Bilateral small pulmonary nodules with low-level hypermetabolism, primarily to the prior PET of 07/09/2021 and favored to be benign. Right hilar hypermetabolism without adenopathy is also favored to be reactive   CT chest December 05, 2022 decrease size of right upper lobe consolidation consistent with residual scarring, irregular solid pulmonary nodules left lower lobe measuring 6 mm and 4 mm.   CT chest Jan 26, 2023 negative for PE, patchy nodular consolidation right lung apex and previous small pulmonary nodules have resolved.       Pulmonary Functions Testing Results: Spirometry 2015: FVC  2.0 (63%) FEV1  0.8 (33%) Ratio 41   pulmonary function testing that was done today that showed very severe airflow obstruction with FEV1 at 24%, ratio 38, FVC 50% and DLCO at 28%.    10/25/2019 SPECT scan: EF 63%, low risk study.  No EKG changes during stress.   Alpha-1 MS 155    Completed pulmonary rehab 2024  01/28/2024 Follow up ; COPD, O2 RF, Lung nodules  Patient presents for 74-month follow-up.  Patient has very severe COPD and oxygen dependent respiratory failure on oxygen.  She remains on chronic steroids with prednisone  5 mg daily at baseline.  She is on Breztri  twice daily.  She was readmitted to the hospital in March for COPD exacerbation.  Treated with steroid taper. Was admitted in February for COPD exacerbation in the setting of RSV infection.  She remains on oxygen after discharge is now on 3 L continuously.  Previously was on 2 L at rest and 3 L with activity.  Since discharge patient says she is feeling better.  Feels that she is near her baseline.  She is back to exercising each day for about 30 minutes.  She completed pulmonary  rehab in 2024.  History of pulmonary nodules underwent previous navigational bronchoscopy December 2022 with negative cytology. PET scan October 2023 showed hypermetabolic activity in the right upper lobe felt to  favor an inflammatory etiology but neoplasm felt less likely. Bilateral pulmonary nodules similar to prior PET scan and November 2022. Right hilar hypermetabolism without adenopathy favored to be reactive.  CT chest November 12, 2023 negative for PE, advanced emphysema.  Regression of right upper lobe pulmonary density appearing more bandlike suggestive of scarring.  Fiducial marker adjacent to pulmonary nodular focus in the right upper lobe is stable to slightly regressed.      Allergies  Allergen Reactions   Bisoprolol  Fumarate Other (See Comments)    Other reaction(s): heart issues    Immunization History  Administered Date(s) Administered   Fluad Quad(high Dose 65+) 06/06/2020, 05/10/2021, 06/05/2022   Fluad Trivalent(High Dose 65+) 05/28/2023   Influenza Split 05/31/2015, 06/07/2018, 05/09/2019   Influenza,inj,Quad PF,6+ Mos 09/22/2013, 06/10/2016, 06/25/2017, 04/30/2018, 06/01/2019   Influenza,inj,quad, With Preservative 09/07/2015   PFIZER(Purple Top)SARS-COV-2 Vaccination 11/27/2019, 12/18/2019   Pneumococcal Conjugate-13 04/17/2015   Pneumococcal Polysaccharide-23 12/08/2016   Tdap 04/10/2017    Past Medical History:  Diagnosis Date   Agatston coronary artery calcium  score greater than 400    score 678   Anxiety    Arthritis    in neck   COPD (chronic obstructive pulmonary disease) (HCC)    Diabetes (HCC)    Dyspnea    Emphysema lung (HCC)    Essential thrombocythemia (HCC) 09/15/2019   History of hiatal hernia    Hx of TIA (transient ischemic attack) and stroke    Hyperlipidemia    Hypertension    Hypothyroidism    Impaired fasting glucose    Pleural effusion 05/15/2014   CXR 05/2014:  New small right effusion.     Pneumonia    Protein-calorie malnutrition, severe 09/17/2018   Pulmonary nodules 07/14/2018   07/2017 CT chest >pulmonary nodules noted.  07/2017 PET scan -Hypermetabolic LUL nodule  12/2017 CT chest >decreased nodule size, new pulmonary nodules noted.   FOB -atypical cells  CT chest 07/2018 >stable nodules to smaller on established nodules . 2 new nodules RUL , RLL >CT chest 3 months    Thyroid disease    Tobacco abuse 11/28/2015   Smoked 2 packs of cigarettes daily from teenage years until age 69, quit in 2016    Unspecified hypothyroidism 09/20/2013   Viral respiratory infection 09/16/2018    Tobacco History: Social History   Tobacco Use  Smoking Status Former   Current packs/day: 0.00   Average packs/day: 2.0 packs/day for 40.0 years (80.0 ttl pk-yrs)   Types: Cigarettes   Start date: 03/16/1977   Quit date: 03/16/2017   Years since quitting: 6.8  Smokeless Tobacco Never  Tobacco Comments       Counseling given: Not Answered Tobacco comments:     Outpatient Medications Prior to Visit  Medication Sig Dispense Refill   alendronate (FOSAMAX) 70 MG tablet Take 70 mg by mouth every Saturday.     amitriptyline  (ELAVIL ) 50 MG tablet Take 50 mg by mouth at bedtime.     aspirin  EC 81 MG tablet Take 81 mg daily by mouth.      Evolocumab  (REPATHA  SURECLICK) 140 MG/ML SOAJ Inject 140 mg into the skin every 14 (fourteen) days. 6 mL 3   Fluticasone -Umeclidin-Vilant (TRELEGY ELLIPTA ) 100-62.5-25 MCG/ACT AEPB TAKE 1 PUFF BY MOUTH EVERY DAY 60 each 5   hydroxyurea  (HYDREA ) 500 MG  capsule TAKE ONE CAPSULE BY MOUTH EVERY OTHER DAY WITH FOOD TO MINIMIZE GI SIDE EFFECTS 45 capsule 1   levothyroxine  (SYNTHROID ) 75 MCG tablet Take 1 tablet (75 mcg total) by mouth every morning. 30 tablet 1   metoprolol  succinate (TOPROL  XL) 25 MG 24 hr tablet Take 1 tablet (25 mg total) by mouth daily. 90 tablet 3   Multiple Vitamin (MULTIVITAMIN WITH MINERALS) TABS tablet Take 1 tablet by mouth daily.     OXYGEN Inhale 2 L into the lungs continuous.     roflumilast  (DALIRESP ) 500 MCG TABS tablet Take 1 tablet (500 mcg total) by mouth daily. 30 tablet 5   rosuvastatin  (CRESTOR ) 40 MG tablet Take 1 tablet (40 mg total) by mouth daily. 90 tablet 3   No  facility-administered medications prior to visit.     Review of Systems:   Constitutional:   No  weight loss, night sweats,  Fevers, chills, +fatigue, or  lassitude.  HEENT:   No headaches,  Difficulty swallowing,  Tooth/dental problems, or  Sore throat,                No sneezing, itching, ear ache, nasal congestion, post nasal drip,   CV:  No chest pain,  Orthopnea, PND, swelling in lower extremities, anasarca, dizziness, palpitations, syncope.   GI  No heartburn, indigestion, abdominal pain, nausea, vomiting, diarrhea, change in bowel habits, loss of appetite, bloody stools.   Resp:   No chest wall deformity  Skin: no rash or lesions.  GU: no dysuria, change in color of urine, no urgency or frequency.  No flank pain, no hematuria   MS:  No joint pain or swelling.  No decreased range of motion.  No back pain.    Physical Exam  BP 111/76 (BP Location: Left Arm, Patient Position: Sitting, Cuff Size: Normal)   Pulse (!) 101   Temp 97.8 F (36.6 C) (Oral)   Ht 5' 4 (1.626 m)   Wt 98 lb 3.2 oz (44.5 kg)   SpO2 95%   BMI 16.86 kg/m   GEN: A/Ox3; pleasant , NAD, frail, elderly on oxygen   HEENT:  [place]/AT,  NOSE-clear, THROAT-clear, no lesions, no postnasal drip or exudate noted.   NECK:  Supple w/ fair ROM; no JVD; normal carotid impulses w/o bruits; no thyromegaly or nodules palpated; no lymphadenopathy.    RESP decreased breath sounds in the bases  no accessory muscle use, no dullness to percussion  CARD:  RRR, no m/r/g, no peripheral edema, pulses intact, no cyanosis or clubbing.  GI:   Soft & nt; nml bowel sounds; no organomegaly or masses detected.   Musco: Warm bil, no deformities or joint swelling noted.   Neuro: alert, no focal deficits noted.    Skin: Warm, no lesions or rashes    Lab Results:    BNP   Imaging: No results found.  Administration History     None          Latest Ref Rng & Units 02/16/2023    9:52 AM 07/19/2021   10:13 AM   PFT Results  FVC-Pre L 1.54  1.48   FVC-Predicted Pre % 50  47   FVC-Post L 1.52    FVC-Predicted Post % 49    Pre FEV1/FVC % % 38  36   Post FEV1/FCV % % 36    FEV1-Pre L 0.58  0.54   FEV1-Predicted Pre % 24  22   FEV1-Post L 0.55    DLCO uncorrected  ml/min/mmHg 5.59  5.56   DLCO UNC% % 28  28   DLCO corrected ml/min/mmHg 5.70  5.56   DLCO COR %Predicted % 29  28   DLVA Predicted % 46  47   TLC L 6.30    TLC % Predicted % 124    RV % Predicted % 221      No results found for: NITRICOXIDE      Assessment & Plan:   COPD (chronic obstructive pulmonary disease) with emphysema (HCC) Recent COPD exacerbation in the setting of RSV slow to resolve.  Hospitalized times twice (February and March 2025) patient is clinically improved.  Patient is on aggressive maintenance regimen with Trelegy, Daliresp  and chronic steroids with prednisone  5 mg daily.  She is prone to recurrent exacerbations.  Will begin paperwork for Dupixent  in hopes to decrease her exacerbation frequency and hospitalization.. Begin Dupixent  injection every 2 weeks.  Can consider going forward-if nocturnal BiPAP/NIV is indicated.  Will need to check ABG for chronic hypercarbia  Plan  Patient Instructions  Set up for 2 D echo.  Begin paperwork for Dupixent  injections.  Continue on Prednisone  5mg  daily .  Continue on Trelegy daily  , rinse after use.  Robitussin DM As needed  for cough/congestion .  Albuterol  inhaler/neb As needed   Flutter valve Twice daily   Continue on Daliresp  daily  Continue on Oxygen 3l/m  Activity as tolerated.  High protein diet .  Follow up with Dr. Geronimo or Johnisha Louks NP in 3 months and As needed.     Chronic respiratory failure with hypoxia (HCC) Continue on oxygen 3 L to maintain O2 saturations greater than 88 to 90%.  Pulmonary nodules Long history of scattered pulmonary nodules dating back to 2018.  Previous PET scan showed some hypermetabolic activity.  Navigational  bronchoscopy 2022 showed negative cytology.  Patient is underwent serial CT imaging.  Most recent CT November 12, 2023 showed regression  of the right upper lobe pulmonary density appearing more bandlike suggestive of scarring.  Fiducial marker adjacent to the pulmonary nodular focus in the right upper lobe appeared stable to slightly regressed.  Would recommend a CT chest in 1 year.    I spent 41   minutes dedicated to the care of this patient on the date of this encounter to include pre-visit review of records, face-to-face time with the patient discussing conditions above, post visit ordering of testing, clinical documentation with the electronic health record, making appropriate referrals as documented, and communicating necessary findings to members of the patients care team.    Madelin Stank, NP 01/28/2024

## 2024-01-28 NOTE — Assessment & Plan Note (Signed)
Continue on oxygen 3 L to maintain O2 saturations greater than 88 to 90% 

## 2024-01-28 NOTE — Assessment & Plan Note (Addendum)
 Recent COPD exacerbation in the setting of RSV slow to resolve.  Hospitalized times twice (February and March 2025) patient is clinically improved.  Patient is on aggressive maintenance regimen with Trelegy, Daliresp  and chronic steroids with prednisone  5 mg daily.  She is prone to recurrent exacerbations.  Will begin paperwork for Dupixent in hopes to decrease her exacerbation frequency and hospitalization.. Begin Dupixent injection every 2 weeks.  Check 2D echo for possible underlying cor pulmonale Can consider going forward-if nocturnal BiPAP/NIV is indicated.  Will need to check ABG for chronic hypercarbia  Plan  Patient Instructions  Set up for 2 D echo.  Begin paperwork for Dupixent injections.  Continue on Prednisone  5mg  daily .  Continue on Trelegy daily  , rinse after use.  Robitussin DM As needed  for cough/congestion .  Albuterol  inhaler/neb As needed   Flutter valve Twice daily   Continue on Daliresp  daily  Continue on Oxygen 3l/m  Activity as tolerated.  High protein diet .  Follow up with Dr. Bertrum Brodie or [place] Severs NP in 3 months and As needed.

## 2024-01-28 NOTE — Telephone Encounter (Signed)
 Handicapp placard completed for patient.  Original given to patient and copy made and sent to scan.  Dupixent paperwork initiated for COPD and placed in pharmacy box.

## 2024-01-28 NOTE — Assessment & Plan Note (Signed)
 Long history of scattered pulmonary nodules dating back to 2018.  Previous PET scan showed some hypermetabolic activity.  Navigational bronchoscopy 2022 showed negative cytology.  Patient is underwent serial CT imaging.  Most recent CT November 12, 2023 showed progression of the right upper lobe pulmonary density appearing more bandlike suggestive of scarring.  Fiducial marker adjacent to the pulmonary nodular focus in the right upper lobe appeared stable to slightly regressed.  Would recommend a CT chest in 1 year.

## 2024-01-28 NOTE — Patient Instructions (Addendum)
 Set up for 2 D echo.  Begin paperwork for Dupixent injections.  Continue on Prednisone  5mg  daily .  Continue on Trelegy daily  , rinse after use.  Robitussin DM As needed  for cough/congestion .  Albuterol  inhaler/neb As needed   Flutter valve Twice daily   Continue on Daliresp  daily  Continue on Oxygen 3l/m  Activity as tolerated.  High protein diet .  Follow up with Dr. Bertrum Brodie or Darwin Guastella NP in 3 months and As needed.

## 2024-01-29 ENCOUNTER — Telehealth: Payer: Self-pay

## 2024-01-29 NOTE — Telephone Encounter (Signed)
 Received New start paperwork for DUPIXENT. Will update as we work through the benefits process.  Submitted a Prior Authorization request to [employer] for DUPIXENT via CoverMyMeds. Will update once we receive a response.  Key: [id number]

## 2024-02-01 ENCOUNTER — Other Ambulatory Visit (HOSPITAL_COMMUNITY): Payer: Self-pay

## 2024-02-01 NOTE — Telephone Encounter (Signed)
 Patient scheduled for Dupixent new start on 02/03/2024. Will use sample

## 2024-02-01 NOTE — Telephone Encounter (Signed)
 Received notification from [employer] regarding a prior authorization for DUPIXENT. Authorization has been APPROVED from 09/02/2023 to 08/31/2024. Approval letter sent to scan center.  Per test claim, copay for 28 days supply (including loading dose) is $4.80 as secondary Medicaid does not cover 8mL as a 28 day supply. However, test claim for subsequent maintenance doses will only be $4.00 when billed through both insurances.  Patient can fill through [facility]: [phone]   Authorization # [id number]

## 2024-02-03 ENCOUNTER — Other Ambulatory Visit: Payer: Self-pay | Admitting: Pharmacist

## 2024-02-03 ENCOUNTER — Ambulatory Visit: Admitting: Pharmacist

## 2024-02-03 DIAGNOSIS — J449 Chronic obstructive pulmonary disease, unspecified: Secondary | ICD-10-CM

## 2024-02-03 DIAGNOSIS — Z7189 Other specified counseling: Secondary | ICD-10-CM

## 2024-02-03 MED ORDER — DUPIXENT 300 MG/2ML ~~LOC~~ SOAJ
300.0000 mg | SUBCUTANEOUS | 1 refills | Status: DC
  Filled 2024-02-08: qty 4, 28d supply, fill #0
  Filled 2024-02-29: qty 4, 28d supply, fill #1
  Filled 2024-03-22: qty 4, 28d supply, fill #2
  Filled 2024-04-08: qty 4, 28d supply, fill #3
  Filled 2024-05-18: qty 4, 28d supply, fill #4
  Filled 2024-06-15: qty 4, 28d supply, fill #5

## 2024-02-03 NOTE — Patient Instructions (Addendum)
 Your next DUPIXENT dose is due on 02/17/24, 03/02/24, and every 14 days thereafter  CONTINUE Trelegy (1 puff once daily) and roflumilast  500mcg once daily  Your prescription will be shipped from [facility]. Their phone number is [phone] Demaris Fillers will call to schedule first shipment and confirm address. They will mail your medication to your home.  You will need to be seen by your provider in 3 to 4 months to assess how Dupixent is working for you. Please ensure you have a follow-up appointment scheduled in September or October 2025. Call our clinic if you need to make this appointment.  Stay up to date on all routine vaccines: influenza, pneumonia, COVID19, Shingles  How to manage an injection site reaction: Remember the 5 C's: COUNTER - leave on the counter at least 30 minutes but up to overnight to bring medication to room temperature. This may help prevent stinging COLD - place something cold (like an ice gel pack or cold water bottle) on the injection site just before cleansing with alcohol. This may help reduce pain CLARITIN - use Claritin (generic name is loratadine) for the first two weeks of treatment or the day of, the day before, and the day after injecting. This will help to minimize injection site reactions CORTISONE CREAM - apply if injection site is irritated and itching CALL ME - if injection site reaction is bigger than the size of your fist, looks infected, blisters, or if you develop hives

## 2024-02-03 NOTE — Progress Notes (Signed)
 Patient started Dupixent for COPD in office on 6/4/202. Tolerated very well. She is already self-administering Repatha  injections every other week.  CONTINUE Trelegy (1 puff once daily) and roflumilast  500mcg once daily Continue Dupixent 300mg  every 14 days.  Next dose is due 02/17/2024 and every 14 days thereafter.   Geraldene Kleine, PharmD, MPH, BCPS, CPP Clinical Pharmacist (Rheumatology and Pulmonology)

## 2024-02-03 NOTE — Progress Notes (Addendum)
 HPI Patient presents today to [facility] to see pharmacy team for Dupixent new start for COPD.  Past medical history includes HTN, hypothyroidism, hx of TIA  At least 4 COPD related hospitalizations/ED visits since October 2024  Respiratory Medications Current regimen: Trelegy 100-62.5-25mcg (1 puff once daily) and roflumilast  500mcg once daily Patient reports no known adherence challenges  OBJECTIVE Allergies  Allergen Reactions   Bisoprolol  Fumarate Other (See Comments)    Other reaction(s): heart issues    Outpatient Encounter Medications as of 02/03/2024  Medication Sig   alendronate (FOSAMAX) 70 MG tablet Take 70 mg by mouth every Saturday.   amitriptyline  (ELAVIL ) 50 MG tablet Take 50 mg by mouth at bedtime.   aspirin  EC 81 MG tablet Take 81 mg daily by mouth.    Evolocumab  (REPATHA  SURECLICK) 140 MG/ML SOAJ Inject 140 mg into the skin every 14 (fourteen) days.   Fluticasone -Umeclidin-Vilant (TRELEGY ELLIPTA ) 100-62.5-25 MCG/ACT AEPB TAKE 1 PUFF BY MOUTH EVERY DAY   hydroxyurea  (HYDREA ) 500 MG capsule TAKE ONE CAPSULE BY MOUTH EVERY OTHER DAY WITH FOOD TO MINIMIZE GI SIDE EFFECTS   levothyroxine  (SYNTHROID ) 75 MCG tablet Take 1 tablet (75 mcg total) by mouth every morning.   metoprolol  succinate (TOPROL  XL) 25 MG 24 hr tablet Take 1 tablet (25 mg total) by mouth daily.   Multiple Vitamin (MULTIVITAMIN WITH MINERALS) TABS tablet Take 1 tablet by mouth daily.   OXYGEN Inhale 2 L into the lungs continuous.   roflumilast  (DALIRESP ) 500 MCG TABS tablet Take 1 tablet (500 mcg total) by mouth daily.   rosuvastatin  (CRESTOR ) 40 MG tablet Take 1 tablet (40 mg total) by mouth daily.   No facility-administered encounter medications on file as of 02/03/2024.     Immunization History  Administered Date(s) Administered   Fluad Quad(high Dose 65+) 06/06/2020, 05/10/2021, 06/05/2022   Fluad Trivalent(High Dose 65+) 05/28/2023   Influenza Split 05/31/2015, 06/07/2018, 05/09/2019    Influenza,inj,Quad PF,6+ Mos 09/22/2013, 06/10/2016, 06/25/2017, 04/30/2018, 06/01/2019   Influenza,inj,quad, With Preservative 09/07/2015   PFIZER(Purple Top)SARS-COV-2 Vaccination 11/27/2019, 12/18/2019   Pneumococcal Conjugate-13 04/17/2015   Pneumococcal Polysaccharide-23 12/08/2016   Tdap 04/10/2017     PFTs    Latest Ref Rng & Units 02/16/2023    9:52 AM 07/19/2021   10:13 AM  PFT Results  FVC-Pre L 1.54  1.48   FVC-Predicted Pre % 50  47   FVC-Post L 1.52    FVC-Predicted Post % 49    Pre FEV1/FVC % % 38  36   Post FEV1/FCV % % 36    FEV1-Pre L 0.58  0.54   FEV1-Predicted Pre % 24  22   FEV1-Post L 0.55    DLCO uncorrected ml/min/mmHg 5.59  5.56   DLCO UNC% % 28  28   DLCO corrected ml/min/mmHg 5.70  5.56   DLCO COR %Predicted % 29  28   DLVA Predicted % 46  47   TLC L 6.30    TLC % Predicted % 124    RV % Predicted % 221       Eosinophils Most recent blood eosinophil count was 100 cells/microL taken on 11/12/2023.   IgE: 8 on 06/25/2017   Assessment   Biologics training for dupilumab (Dupixent)  Goals of therapy: Mechanism: human monoclonal IgG4 antibody that inhibits interleukin-4 and interleukin-13 cytokine-induced responses, including release of proinflammatory cytokines, chemokines, and IgE Reviewed that Dupixent is add-on medication and patient must continue maintenance inhaler regimen. Response to therapy: may take 4 months to  determine efficacy. Discussed that patients generally feel improvement sooner than 4 months.  Side effects: injection site reaction (6-18%), antibody development (5-16%), ophthalmic conjunctivitis (2-16%), transient blood eosinophilia (1-2%)  Dose: 300mg  every 14 days (for COPD)  Administration/Storage:  Reviewed administration sites of thigh or abdomen (at least 2-3 inches away from abdomen). Reviewed the upper arm is only appropriate if caregiver is administering injection  Do not shake pen/syringe as this could lead to  product foaming or precipitation. Do not use if solution is discolored or contains particulate matter or if window on prefilled pen is yellow (indicates pen has been used).  Reviewed storage of medication in refrigerator. Reviewed that Dupixent can be stored at room temperature in unopened carton for up to 14 days.  Access: Approval of Dupixent through: insurance  Patient self-administered Dupixent 300mg /27ml in right upper thigh using sample Dupixent 300mg /47mL autoinjector pen NDC: [phone] Lot: [id number] Expiration: 08/31/2025  Patient monitored for 30 minutes for adverse reaction.  Patient tolerated well.  Injection site noted. Patient denies itchiness and irritation at injection., No swelling or redness noted., and Reviewed injection site reaction management with patient verbally and printed information for review in AVS  Medication Reconciliation  A drug regimen assessment was performed, including review of allergies, interactions, disease-state management, dosing and immunization history. Medications were reviewed with the patient, including name, instructions, indication, goals of therapy, potential side effects, importance of adherence, and safe use.  Drug interaction(s): none noted  PLAN Continue Dupixent 300mg  every 14 days.  Next dose is due 02/17/2024 and every 14 days thereafter. Rx sent to: [facility]: [phone] .  Patient provided with pharmacy phone number and advised that Demaris Fillers will call to schedule shipment to home Continue maintenance regimen of: Trelegy 100-62.5-25mcg (1 puff once daily) and roflumilast  500mcg once daily  All questions encouraged and answered.  Instructed patient to reach out with any further questions or concerns.  Thank you for allowing pharmacy to participate in this patient's care.  This appointment required 45 minutes of patient care (this includes precharting, chart review, review of results, face-to-face care, etc.).    Geraldene Kleine, PharmD, MPH, BCPS, CPP Clinical Pharmacist (Rheumatology and Pulmonology)

## 2024-02-04 NOTE — Telephone Encounter (Signed)
 FYI

## 2024-02-08 ENCOUNTER — Other Ambulatory Visit: Payer: Self-pay

## 2024-02-08 NOTE — Progress Notes (Signed)
 Specialty Pharmacy Initial Fill Coordination Note  Cheryl Haley is a 68 y.o. female contacted today regarding initial fill of specialty medication(s) Dupilumab  (Dupixent )   Patient requested Delivery   Delivery date: 02/11/24   Verified address: [address]   [place] [address]   Medication will be filled on 11/10/23.   Patient is aware of $4 copayment. Payment information collected and forwarded to [facility] at [facility].

## 2024-02-09 ENCOUNTER — Other Ambulatory Visit: Payer: Self-pay

## 2024-02-10 ENCOUNTER — Other Ambulatory Visit: Payer: Self-pay

## 2024-02-18 ENCOUNTER — Ambulatory Visit (HOSPITAL_BASED_OUTPATIENT_CLINIC_OR_DEPARTMENT_OTHER)
Admission: RE | Admit: 2024-02-18 | Discharge: 2024-02-18 | Disposition: A | Discharge status: Home / Self Care | Source: Ambulatory Visit | Attending: Adult Health | Admitting: Adult Health

## 2024-02-18 ENCOUNTER — Ambulatory Visit: Payer: Self-pay | Admitting: Adult Health

## 2024-02-18 DIAGNOSIS — R0602 Shortness of breath: Secondary | ICD-10-CM | POA: Diagnosis not present

## 2024-02-18 LAB — ECHOCARDIOGRAM COMPLETE
AR max vel: 1.88 cm2
AV Area VTI: 1.94 cm2
AV Area mean vel: 1.89 cm2
AV Mean grad: 3 mmHg
AV Peak grad: 4.9 mmHg
Ao pk vel: 1.11 m/s
Area-P 1/2: 4.41 cm2
Calc EF: 54.2 %
S' Lateral: 1.4 cm
Single Plane A2C EF: 58 %
Single Plane A4C EF: 54.7 %

## 2024-02-21 ENCOUNTER — Other Ambulatory Visit: Payer: Self-pay | Admitting: Adult Health

## 2024-02-23 ENCOUNTER — Ambulatory Visit (HOSPITAL_COMMUNITY)

## 2024-02-26 ENCOUNTER — Encounter (INDEPENDENT_AMBULATORY_CARE_PROVIDER_SITE_OTHER): Payer: Self-pay

## 2024-02-29 ENCOUNTER — Other Ambulatory Visit: Payer: Self-pay

## 2024-02-29 ENCOUNTER — Other Ambulatory Visit (HOSPITAL_COMMUNITY): Payer: Self-pay

## 2024-02-29 NOTE — Progress Notes (Signed)
 Specialty Pharmacy Refill Coordination Note  Cheryl Haley is a 68 y.o. female contacted today regarding refills of specialty medication(s) Dupilumab  (Dupixent )   Patient requested Delivery   Delivery date: 03/03/24   Verified address: [address] [place] [address]   Medication will be filled on 03/02/24, when insurance will pay.

## 2024-03-02 ENCOUNTER — Other Ambulatory Visit: Payer: Self-pay

## 2024-03-22 ENCOUNTER — Encounter (INDEPENDENT_AMBULATORY_CARE_PROVIDER_SITE_OTHER): Payer: Self-pay

## 2024-03-22 ENCOUNTER — Other Ambulatory Visit: Payer: Self-pay

## 2024-03-22 NOTE — Progress Notes (Signed)
 Specialty Pharmacy Refill Coordination Note  Cheryl Haley is a 68 y.o. female contacted today regarding refills of specialty medication(s) Dupilumab  (Dupixent )   Patient requested Delivery   Delivery date: 03/24/24   Verified address: (Patient-Rptd) [address] [place]. [address]   Medication will be filled on 07.23.25.

## 2024-03-23 ENCOUNTER — Other Ambulatory Visit: Payer: Self-pay

## 2024-03-23 NOTE — Addendum Note (Signed)
 Addended by: DAYNE SHERRY RAMAN on: 03/23/2024 10:06 AM   Modules accepted: Level of Service

## 2024-04-08 ENCOUNTER — Encounter (INDEPENDENT_AMBULATORY_CARE_PROVIDER_SITE_OTHER): Payer: Self-pay

## 2024-04-08 ENCOUNTER — Other Ambulatory Visit: Payer: Self-pay

## 2024-04-08 DIAGNOSIS — D473 Essential (hemorrhagic) thrombocythemia: Secondary | ICD-10-CM

## 2024-04-08 DIAGNOSIS — D509 Iron deficiency anemia, unspecified: Secondary | ICD-10-CM

## 2024-04-08 NOTE — Progress Notes (Signed)
 Specialty Pharmacy Refill Coordination Note  Cheryl Haley is a 68 y.o. female contacted today regarding refills of specialty medication(s) Dupilumab  (Dupixent )   Patient requested (Patient-Rptd) Delivery   Delivery date: 04/14/24   Verified address: (Patient-Rptd) [address]   Medication will be filled on 04/13/2024.

## 2024-04-11 ENCOUNTER — Inpatient Hospital Stay: Payer: Medicare Other | Attending: Medical Oncology

## 2024-04-11 ENCOUNTER — Inpatient Hospital Stay (HOSPITAL_BASED_OUTPATIENT_CLINIC_OR_DEPARTMENT_OTHER): Payer: Medicare Other | Admitting: Medical Oncology

## 2024-04-11 VITALS — BP 106/51 | HR 80 | Temp 97.9°F | Resp 20 | Wt 96.1 lb

## 2024-04-11 DIAGNOSIS — D75839 Thrombocytosis, unspecified: Secondary | ICD-10-CM | POA: Diagnosis not present

## 2024-04-11 DIAGNOSIS — D649 Anemia, unspecified: Secondary | ICD-10-CM

## 2024-04-11 DIAGNOSIS — D509 Iron deficiency anemia, unspecified: Secondary | ICD-10-CM

## 2024-04-11 DIAGNOSIS — Z7982 Long term (current) use of aspirin: Secondary | ICD-10-CM | POA: Insufficient documentation

## 2024-04-11 DIAGNOSIS — D473 Essential (hemorrhagic) thrombocythemia: Secondary | ICD-10-CM | POA: Diagnosis not present

## 2024-04-11 LAB — CBC WITH DIFFERENTIAL (CANCER CENTER ONLY)
Abs Immature Granulocytes: 0.07 K/uL (ref 0.00–0.07)
Basophils Absolute: 0 K/uL (ref 0.0–0.1)
Basophils Relative: 1 %
Eosinophils Absolute: 0 K/uL (ref 0.0–0.5)
Eosinophils Relative: 1 %
HCT: 42.4 % (ref 36.0–46.0)
Hemoglobin: 13.5 g/dL (ref 12.0–15.0)
Immature Granulocytes: 1 %
Lymphocytes Relative: 17 %
Lymphs Abs: 1.3 K/uL (ref 0.7–4.0)
MCH: 31 pg (ref 26.0–34.0)
MCHC: 31.8 g/dL (ref 30.0–36.0)
MCV: 97.5 fL (ref 80.0–100.0)
Monocytes Absolute: 0.5 K/uL (ref 0.1–1.0)
Monocytes Relative: 7 %
Neutro Abs: 5.8 K/uL (ref 1.7–7.7)
Neutrophils Relative %: 73 %
Platelet Count: 328 K/uL (ref 150–400)
RBC: 4.35 MIL/uL (ref 3.87–5.11)
RDW: 13.6 % (ref 11.5–15.5)
WBC Count: 7.8 K/uL (ref 4.0–10.5)
nRBC: 0 % (ref 0.0–0.2)

## 2024-04-11 LAB — CMP (CANCER CENTER ONLY)
ALT: 14 U/L (ref 0–44)
AST: 27 U/L (ref 15–41)
Albumin: 4.2 g/dL (ref 3.5–5.0)
Alkaline Phosphatase: 59 U/L (ref 38–126)
Anion gap: 10 (ref 5–15)
BUN: 7 mg/dL — ABNORMAL LOW (ref 8–23)
CO2: 29 mmol/L (ref 22–32)
Calcium: 9.1 mg/dL (ref 8.9–10.3)
Chloride: 98 mmol/L (ref 98–111)
Creatinine: 0.79 mg/dL (ref 0.44–1.00)
GFR, Estimated: >60 mL/min (ref >60)
Glucose, Bld: 108 mg/dL — ABNORMAL HIGH (ref 70–99)
Potassium: 5 mmol/L (ref 3.5–5.1)
Sodium: 137 mmol/L (ref 135–145)
Total Bilirubin: 0.3 mg/dL (ref 0.0–1.2)
Total Protein: 7 g/dL (ref 6.5–8.1)

## 2024-04-11 LAB — LACTATE DEHYDROGENASE: LDH: 178 U/L (ref 98–192)

## 2024-04-11 LAB — IRON AND IRON BINDING CAPACITY (CC-WL,HP ONLY)
Iron: 62 ug/dL (ref 28–170)
Saturation Ratios: 16 % (ref 10.4–31.8)
TIBC: 399 ug/dL (ref 250–450)
UIBC: 337 ug/dL

## 2024-04-11 LAB — FERRITIN: Ferritin: 28 ng/mL (ref 11–307)

## 2024-04-11 NOTE — Progress Notes (Signed)
 Hematology and Oncology Follow Up Visit  Cheryl Haley [id number] October 24, 1955 68 y.o. 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 She is here with her sister who helps look after her.   Today they state that she has been doing well. They have no concerns.   There has been no bleeding to her knowledge: denies epistaxis, gingivitis, hemoptysis, hematemesis, hematuria, melena, excessive bruising, blood donation.   She denies fatigue or worsening of SOB. She uses 3L supplemental O2 when up and about and then drops to 2L when sedentary at home.  No new skin lesions.  She declines breast cancer screenings She is UTD on colon cancer screenings (2024) She is not UTD on seeing the dentist but is agreeable to schedule a visit She is not UTD on skin cancer screenings but has a CPE in Dec in which she will ask for one.  No fever, chills, n/v, cough, rash, dizziness, chest pain, palpitations, abdominal pain or changes in bowel or bladder habits.  No swelling, tenderness, numbness or tingling in her extremities at this time.  No falls or syncope reported. She uses her Rolator for added support.  Appetite and hydration have been good.   Wt Readings from Last 3 Encounters:  04/11/24 96 lb 1.3 oz (43.6 kg)  01/28/24 98 lb 3.2 oz (44.5 kg)  11/25/23 98 lb (44.5 kg)   ECOG Performance Status: 1 - Symptomatic but completely ambulatory  Medications:  Allergies as of 04/11/2024       Reactions   Bisoprolol  Fumarate Other (See Comments)   Other reaction(s): heart issues        Medication List        Accurate as of April 11, 2024 10:08 AM. If you have any questions, ask your nurse or doctor.          alendronate 70 MG tablet Commonly known as: FOSAMAX Take 70 mg by mouth every  Saturday.   amitriptyline  50 MG tablet Commonly known as: ELAVIL  Take 50 mg by mouth at bedtime.   aspirin  EC 81 MG tablet Take 81 mg daily by mouth.   Dupixent  300 MG/2ML Soaj Generic drug: Dupilumab  Inject 300 mg into the skin every 14 (fourteen) days.   hydroxyurea  500 MG capsule Commonly known as: HYDREA  TAKE ONE CAPSULE BY MOUTH EVERY OTHER DAY WITH FOOD TO MINIMIZE GI SIDE EFFECTS   levothyroxine  75 MCG tablet Commonly known as: SYNTHROID  Take 1 tablet (75 mcg total) by mouth every morning.   metoprolol  succinate 25 MG 24 hr tablet Commonly known as: Toprol  XL Take 1 tablet (25 mg total) by mouth daily.   multivitamin with minerals Tabs tablet Take 1 tablet by mouth daily.   OXYGEN Inhale 2 L into the lungs continuous.   Repatha  SureClick 140 MG/ML Soaj Generic drug: Evolocumab  Inject 140 mg into the skin every 14 (fourteen) days.   roflumilast  500 MCG Tabs tablet Commonly known as: DALIRESP  TAKE 1 TABLET BY MOUTH DAILY   rosuvastatin  40 MG tablet Commonly known as: CRESTOR  Take 1 tablet (40 mg total) by mouth daily.   Trelegy Ellipta  100-62.5-25 MCG/ACT Aepb Generic drug: Fluticasone -Umeclidin-Vilant TAKE 1 PUFF BY MOUTH EVERY DAY        Allergies:  Allergies  Allergen Reactions   Bisoprolol  Fumarate Other (See Comments)  Other reaction(s): heart issues    Past Medical History, Surgical history, Social history, and Family History were reviewed and updated.  Review of Systems: All other 10 point review of systems is negative.   Physical Exam:  weight is 96 lb 1.3 oz (43.6 kg). Her oral temperature is 97.9 F (36.6 C). Her blood pressure is 106/51 (abnormal) and her pulse is 80. Her respiration is 20 and oxygen saturation is 98%.   Wt Readings from Last 3 Encounters:  04/11/24 96 lb 1.3 oz (43.6 kg)  01/28/24 98 lb 3.2 oz (44.5 kg)  11/25/23 98 lb (44.5 kg)    Ocular: Sclerae unicteric, pupils equal, round and reactive to  light Ear-nose-throat: Oropharynx clear, dentition fair Lymphatic: No cervical or supraclavicular adenopathy Lungs no rales or rhonchi, good excursion bilaterally Heart regular rate and rhythm, no murmur appreciated Abd soft, nontender, positive bowel sounds MSK no focal spinal tenderness, no joint edema Neuro: non-focal, well-oriented, appropriate affect   Lab Results  Component Value Date   WBC 7.8 04/11/2024   HGB 13.5 04/11/2024   HCT 42.4 04/11/2024   MCV 97.5 04/11/2024   PLT 328 04/11/2024   Lab Results  Component Value Date   FERRITIN 27 10/12/2023   IRON 57 10/12/2023   TIBC 372 10/12/2023   UIBC 315 10/12/2023   IRONPCTSAT 15 10/12/2023   Lab Results  Component Value Date   RBC 4.35 04/11/2024   No results found for: KPAFRELGTCHN, LAMBDASER, KAPLAMBRATIO No results found for: KIMBERLY LE, IGMSERUM Lab Results  Component Value Date   TOTALPROTELP 6.8 09/23/2018   ALBUMINELP 3.9 09/23/2018   A1GS 0.1 09/23/2018   A2GS 0.9 09/23/2018   BETS 0.8 09/23/2018   GAMS 1.0 09/23/2018   MSPIKE Not Observed 09/23/2018   SPEI Comment 09/23/2018     Chemistry      Component Value Date/Time   NA 137 04/11/2024 0924   K 5.0 04/11/2024 0924   CL 98 04/11/2024 0924   CO2 29 04/11/2024 0924   BUN 7 (L) 04/11/2024 0924   CREATININE 0.79 04/11/2024 0924      Component Value Date/Time   CALCIUM  9.1 04/11/2024 0924   ALKPHOS 59 04/11/2024 0924   AST 27 04/11/2024 0924   ALT 14 04/11/2024 0924   BILITOT 0.3 04/11/2024 0924     No diagnosis found.   Impression and Plan: Cheryl Haley is a very pleasant 68 yo female with essential thrombocythemia, JAK2 V617F+; high risk by IPSET. She currently is taking Hydrea  500 mg every other day.   CBC/CMP are stable. Hydrea  refilled- continue at current dose. We are spacing out follow up to match up with her sister who is caregiver.  Iron studies are pending RTC 6 months APP, labs (LDH, iron, ferritin, CMP, CBC  w/)  Lauraine CHRISTELLA Dais, PA-C 8/11/202510:08 AM

## 2024-04-14 ENCOUNTER — Ambulatory Visit: Payer: Self-pay | Admitting: Medical Oncology

## 2024-04-21 ENCOUNTER — Encounter: Payer: Self-pay | Admitting: Adult Health

## 2024-04-21 ENCOUNTER — Ambulatory Visit (INDEPENDENT_AMBULATORY_CARE_PROVIDER_SITE_OTHER): Admitting: Adult Health

## 2024-04-21 VITALS — BP 118/58 | Temp 97.5°F | Ht 64.0 in | Wt 99.8 lb

## 2024-04-21 DIAGNOSIS — E43 Unspecified severe protein-calorie malnutrition: Secondary | ICD-10-CM

## 2024-04-21 DIAGNOSIS — J9611 Chronic respiratory failure with hypoxia: Secondary | ICD-10-CM | POA: Diagnosis not present

## 2024-04-21 DIAGNOSIS — R918 Other nonspecific abnormal finding of lung field: Secondary | ICD-10-CM

## 2024-04-21 DIAGNOSIS — J439 Emphysema, unspecified: Secondary | ICD-10-CM | POA: Diagnosis not present

## 2024-04-21 DIAGNOSIS — R0602 Shortness of breath: Secondary | ICD-10-CM

## 2024-04-21 NOTE — Progress Notes (Signed)
 @Patient  ID: Cheryl Haley, female    DOB: 1956/03/09, 68 y.o.   MRN: [id number]  Chief Complaint  Patient presents with   COPD    Referring provider: Verdia Lombard, MD  HPI: 68 yo female followed for COPD steroid-dependent prone to freqruent exacerbations and hospitalizations and chronic respiratory failure on Oxygen and Lung nodules   COPD Timeline :  2015 FEV1 33% (establish with [facility]), Rx ICS/LABA 2024 FEV1 24% Alpha 1 MS-155  Started O2 2020 , Triple therapy (ICS/LABA/LAMA) Daliresp  added 2022  Chronic steroids Pred 5mg  daily started 2022 Completed pulmonary rehab 2024  Dupixent  added 12/2023    PFT  Pulmonary Functions Testing Results: Spirometry 2015: FVC  2.0 (63%) FEV1  0.8 (33%) Ratio 41   PFT 02/16/23 Showed very severe airflow obstruction with FEV1 at 24%, ratio 38, FVC 50% and DLCO at 28%.    Lung nodules timeline :  CT chest 06/28/2019- severe centrilobular emphysema throughout both lungs.  Left upper lobe spiculated nodule associated with linear scar.  Right middle lobe spiculated nodule.  Airway thickening.  Scarring in right greater than left lower lobes.  No significant mediastinal or hilar adenopathy.  Nodules stable or smaller than previous CT scans.   CT cardiac scoring 10/07/2019- redemonstrated right middle lobe nodule.  No new concerning nodules.   Interval near complete resolution of previously demonstrated lingular infiltrate, consistent with resolving pneumonia. 2. The 3 suspicious right lung nodules seen on the most recent study persist, and based on multiplicity could be postinflammatory.   PET scan 07/09/2021- Moderate metabolic activity associated with irregular nodules clustered in the RIGHT upper lobe. Favor post inflammatory or infectious process. Cannot exclude neoplasm. Recommend tissue sampling versus CT surveillance. 2. Persistent but decreased activity in LEFT upper lobe parenchymal lesion.   Navigational bronchoscopy  August 28, 2021.  Cytology was negative for malignancy.  Cultures were negative.    CT chest Jan 10, 2022 showed minimally progressed right upper lobe consolidation and new nodular opacities along the lateral aspect of this consolidation questionable mucoid impaction, previous pulmonary nodules stable    PET scan 06/2022 -Hypermetabolism corresponding to posterior right upper lobe area of partially cavitary consolidation. Morphology and growth rate over multiple prior exams favor an infectious or inflammatory etiology. Neoplasm felt less likely.Bilateral small pulmonary nodules with low-level hypermetabolism, primarily to the prior PET of 07/09/2021 and favored to be benign. Right hilar hypermetabolism without adenopathy is also favored to be reactive   CT chest December 05, 2022 decrease size of right upper lobe consolidation consistent with residual scarring, irregular solid pulmonary nodules left lower lobe measuring 6 mm and 4 mm.   CT chest Jan 26, 2023 negative for PE, patchy nodular consolidation right lung apex and previous small pulmonary nodules have resolved.   CT chest 05/2023 stable RUL nodule adjacent to fiducials, R Apical density larger  CT chest 11/12/23 -advanced emphysema, mild bronchiectasis in the right upper lobe, regression of right upper lobe pulmonary density appearing more bandlike suggestive of scarring, right upper lobe pulmonary nodular focus slightly regressed.    CARDS  10/25/2019 SPECT scan: EF 63%, low risk study.  No EKG changes during stress.  02/18/24 Echo EF 60-65%, Gr 1 DD, RV size nml      04/21/2024 Follow up : COPD, O2 RF, Lung nodules  Discussed the use of AI scribe software for clinical note transcription with the patient, who gave verbal consent to proceed.  History of Present Illness Cheryl Haley is a 68  year old female with COPD, O2 RF, Lung nodularity  who presents for a three-month checkup.  She has experienced no hospitalizations since her  last visit, overall feels her breathing is doing about the same.  She gets very short of breath with minimal activity.  She is on chronic oxygen 3 L.  Last visit patient was recovering from recent recurrent hospitalization from February and March for COPD exacerbation.  She is on maximum therapy with chronic steroids prednisone  5 mg daily Trelegy once daily, Daliresp , albuterol  nebulizer.  At last visit she was started on Dupixent  injection.  Says that she has started that they seem to be doing well.  She has had no known side effects.  Has not noticed any significant change in her breathing but is tolerating well.   Her last hospitalizations occurred in February and March of this year. She has been on a stable dose of prednisone  for an extended period. She switched from Breztri  to Trelegy due to insurance reasons. She has not been using the nebulizer recently.  She reports no hemoptysis and notes a slight weight increase, nearing 100 pounds, BMI 17.  Trying to have a higher protein diet.  Was set up for an echo last visit that showed preserved EF,Moderate LVH, grade 1 diastolic dysfunction, normal RV size and no valvular disease.  We reviewed her test results in detail.    Allergies  Allergen Reactions   Bisoprolol  Fumarate Other (See Comments)    Other reaction(s): heart issues    Immunization History  Administered Date(s) Administered   Fluad Quad(high Dose 65+) 06/06/2020, 05/10/2021, 06/05/2022   Fluad Trivalent(High Dose 65+) 05/28/2023   Influenza Split 05/31/2015, 06/07/2018, 05/09/2019   Influenza,inj,Quad PF,6+ Mos 09/22/2013, 06/10/2016, 06/25/2017, 04/30/2018, 06/01/2019   Influenza,inj,quad, With Preservative 09/07/2015   PFIZER(Purple Top)SARS-COV-2 Vaccination 11/27/2019, 12/18/2019   Pneumococcal Conjugate-13 04/17/2015   Pneumococcal Polysaccharide-23 12/08/2016   Tdap 04/10/2017    Past Medical History:  Diagnosis Date   Agatston coronary artery calcium  score greater  than 400    score 678   Anxiety    Arthritis    in neck   COPD (chronic obstructive pulmonary disease) (HCC)    Diabetes (HCC)    Dyspnea    Emphysema lung (HCC)    Essential thrombocythemia (HCC) 09/15/2019   History of hiatal hernia    Hx of TIA (transient ischemic attack) and stroke    Hyperlipidemia    Hypertension    Hypothyroidism    Impaired fasting glucose    Pleural effusion 05/15/2014   CXR 05/2014:  New small right effusion.     Pneumonia    Protein-calorie malnutrition, severe 09/17/2018   Pulmonary nodules 07/14/2018   07/2017 CT chest >pulmonary nodules noted.  07/2017 PET scan -Hypermetabolic LUL nodule  12/2017 CT chest >decreased nodule size, new pulmonary nodules noted.  FOB -atypical cells  CT chest 07/2018 >stable nodules to smaller on established nodules . 2 new nodules RUL , RLL >CT chest 3 months    Thyroid disease    Tobacco abuse 11/28/2015   Smoked 2 packs of cigarettes daily from teenage years until age 14, quit in 2016    Unspecified hypothyroidism 09/20/2013   Viral respiratory infection 09/16/2018    Tobacco History: Social History   Tobacco Use  Smoking Status Former   Current packs/day: 0.00   Average packs/day: 2.0 packs/day for 40.0 years (80.0 ttl pk-yrs)   Types: Cigarettes   Start date: 03/16/1977   Quit date: 03/16/2017  Years since quitting: 7.1  Smokeless Tobacco Never  Tobacco Comments       Counseling given: Not Answered Tobacco comments:     Outpatient Medications Prior to Visit  Medication Sig Dispense Refill   alendronate (FOSAMAX) 70 MG tablet Take 70 mg by mouth every Saturday.     amitriptyline  (ELAVIL ) 50 MG tablet Take 50 mg by mouth at bedtime.     aspirin  EC 81 MG tablet Take 81 mg daily by mouth.      Dupilumab  (DUPIXENT ) 300 MG/2ML SOAJ Inject 300 mg into the skin every 14 (fourteen) days. 12 mL 1   Evolocumab  (REPATHA  SURECLICK) 140 MG/ML SOAJ Inject 140 mg into the skin every 14 (fourteen) days. 6 mL 3    Fluticasone -Umeclidin-Vilant (TRELEGY ELLIPTA ) 100-62.5-25 MCG/ACT AEPB TAKE 1 PUFF BY MOUTH EVERY DAY 60 each 5   hydroxyurea  (HYDREA ) 500 MG capsule TAKE ONE CAPSULE BY MOUTH EVERY OTHER DAY WITH FOOD TO MINIMIZE GI SIDE EFFECTS 45 capsule 1   levothyroxine  (SYNTHROID ) 75 MCG tablet Take 1 tablet (75 mcg total) by mouth every morning. 30 tablet 1   metoprolol  succinate (TOPROL  XL) 25 MG 24 hr tablet Take 1 tablet (25 mg total) by mouth daily. 90 tablet 3   Multiple Vitamin (MULTIVITAMIN WITH MINERALS) TABS tablet Take 1 tablet by mouth daily.     OXYGEN Inhale 2 L into the lungs continuous.     roflumilast  (DALIRESP ) 500 MCG TABS tablet TAKE 1 TABLET BY MOUTH DAILY 90 tablet 1   rosuvastatin  (CRESTOR ) 40 MG tablet Take 1 tablet (40 mg total) by mouth daily. 90 tablet 3   No facility-administered medications prior to visit.     Review of Systems:   Constitutional:   No  weight loss, night sweats,  Fevers, chills, +fatigue, or  lassitude.  HEENT:   No headaches,  Difficulty swallowing,  Tooth/dental problems, or  Sore throat,                No sneezing, itching, ear ache, nasal congestion, post nasal drip,   CV:  No chest pain,  Orthopnea, PND, swelling in lower extremities, anasarca, dizziness, palpitations, syncope.   GI  No heartburn, indigestion, abdominal pain, nausea, vomiting, diarrhea, change in bowel habits, loss of appetite, bloody stools.   Resp: No wheezing.  No chest wall deformity  Skin: no rash or lesions.  GU: no dysuria, change in color of urine, no urgency or frequency.  No flank pain, no hematuria   MS:  No joint pain or swelling.  No decreased range of motion.  No back pain.    Physical Exam  BP (!) 118/58   Temp (!) 97.5 F (36.4 C)   Ht 5' 4 (1.626 m)   Wt 99 lb 12.8 oz (45.3 kg)   SpO2 97% Comment: 3L POC  BMI 17.13 kg/m   GEN: A/Ox3; pleasant , NAD, well nourished , frail and elderly, on O2, rolling walker    HEENT:  [place]/AT,    NOSE-clear,  THROAT-clear, no lesions, no postnasal drip or exudate noted.   NECK:  Supple w/ fair ROM; no JVD; normal carotid impulses w/o bruits; no thyromegaly or nodules palpated; no lymphadenopathy.    RESP  Clear  P & A; w/o, wheezes/ rales/ or rhonchi. no accessory muscle use, no dullness to percussion  CARD:  RRR, no m/r/g, no peripheral edema, pulses intact, no cyanosis or clubbing.  GI:   Soft & nt; nml bowel sounds; no organomegaly or masses  detected.   Musco: Warm bil, no deformities or joint swelling noted.   Neuro: alert, no focal deficits noted.    Skin: Warm, no lesions or rashes    Lab Results:  CBC    Component Value Date/Time   WBC 7.8 04/11/2024 0924   WBC 4.3 11/13/2023 0618   RBC 4.35 04/11/2024 0924   HGB 13.5 04/11/2024 0924   HCT 42.4 04/11/2024 0924   PLT 328 04/11/2024 0924   MCV 97.5 04/11/2024 0924   MCH 31.0 04/11/2024 0924   MCHC 31.8 04/11/2024 0924   RDW 13.6 04/11/2024 0924   LYMPHSABS 1.3 04/11/2024 0924   MONOABS 0.5 04/11/2024 0924   EOSABS 0.0 04/11/2024 0924   BASOSABS 0.0 04/11/2024 0924    BMET    Component Value Date/Time   NA 137 04/11/2024 0924   K 5.0 04/11/2024 0924   CL 98 04/11/2024 0924   CO2 29 04/11/2024 0924   GLUCOSE 108 (H) 04/11/2024 0924   BUN 7 (L) 04/11/2024 0924   CREATININE 0.79 04/11/2024 0924   CALCIUM  9.1 04/11/2024 0924   GFRNONAA >60 04/11/2024 0924   GFRAA >60 05/04/2020 1132    BNP    Component Value Date/Time   BNP 28.1 01/26/2023 0849    ProBNP    Component Value Date/Time   PROBNP 256.1 (H) 09/20/2013 1640    Imaging: No results found.  Administration History     None          Latest Ref Rng & Units 02/16/2023    9:52 AM 07/19/2021   10:13 AM  PFT Results  FVC-Pre L 1.54  1.48   FVC-Predicted Pre % 50  47   FVC-Post L 1.52    FVC-Predicted Post % 49    Pre FEV1/FVC % % 38  36   Post FEV1/FCV % % 36    FEV1-Pre L 0.58  0.54   FEV1-Predicted Pre % 24  22   FEV1-Post L 0.55     DLCO uncorrected ml/min/mmHg 5.59  5.56   DLCO UNC% % 28  28   DLCO corrected ml/min/mmHg 5.70  5.56   DLCO COR %Predicted % 29  28   DLVA Predicted % 46  47   TLC L 6.30    TLC % Predicted % 124    RV % Predicted % 221      No results found for: NITRICOXIDE      Assessment & Plan:   No problem-specific Assessment & Plan notes found for this encounter.  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 Lung function is at 24%.  Has high symptom burden at rest.  She is on home oxygen at 3 liters and has had no recent hospitalizations since March of this year.  Dupixent  added last visit in hopes to decrease frequent COPD exacerbations seem to be tolerating well.. Prednisone  is continued due to long-term use.  Patient education on long-term steroid use.  Continue prednisone  5 mg daily symptoms have not significantly changed, but there have been no hospitalizations since starting Dupixent . I Continue Dupixent  every two weeks, prednisone  5 mg daily, Trelegy, and Daliresp . Maintain home oxygen at 3 liters and use a nebulizer as needed. Ensure her family doctor is aware of prednisone  use due to risks of bone weakening and increased diabetes risk. Rinse mouth after using Trelegy.  Chronic respiratory failure  continue on oxygen maintain O2 saturation greater than 88 to 90%  Bronchiectasis, right upper lobe  -continue mucociliary clearance Bronchiectasis in the right upper lobe. Continue Trelegy and albuterol  nebulizers  Lung nodularity -followed on serial imaging.  CT chest March 2025 showed regressed right upper lobe pulmonary  nodularity, surveillance planned    A surveillance CT scan is planned for February 2026.  Diastolic dysfunction (heart stiffness)  -echo results reviewed Diastolic dysfunction is noted on  echocardiogram with an EF of 60-65%. There is no valvular disease.  Peers euvolemic on exam with no evidence of acute volume overload.  Severe protein calorie malnutrition BMI 17.  Encouraged on a high-protein diet  Plan  Patient Instructions  Continue on Dupixent  injections every 2 weeks.  Continue on Prednisone  5mg  daily .  Continue on Trelegy daily  , rinse after use.  Robitussin DM As needed  for cough/congestion .  Albuterol  inhaler/neb As needed   Flutter valve Twice daily   Continue on Daliresp  daily  Continue on Oxygen 3l/m  Activity as tolerated.  High protein diet .  Follow up with Dr. Geronimo in  3 months and As needed.       I spent  40  minutes dedicated to the care of this patient on the date of this encounter to include pre-visit review of records, face-to-face time with the patient discussing conditions above, post visit ordering of testing, clinical documentation with the electronic health record, making appropriate referrals as documented, and communicating necessary findings to members of the patients care team.   Madelin Stank, NP 04/21/2024

## 2024-04-21 NOTE — Patient Instructions (Addendum)
 Continue on Dupixent  injections every 2 weeks.  Continue on Prednisone  5mg  daily .  Continue on Trelegy daily  , rinse after use.  Robitussin DM As needed  for cough/congestion .  Albuterol  inhaler/neb As needed   Flutter valve Twice daily   Continue on Daliresp  daily  Continue on Oxygen 3l/m  Activity as tolerated.  High protein diet .  Follow up with Dr. Geronimo in  3 months and As needed.

## 2024-05-15 ENCOUNTER — Other Ambulatory Visit: Payer: Self-pay | Admitting: Adult Health

## 2024-05-16 ENCOUNTER — Other Ambulatory Visit (HOSPITAL_COMMUNITY): Payer: Self-pay

## 2024-05-18 ENCOUNTER — Other Ambulatory Visit: Payer: Self-pay | Admitting: Pharmacy Technician

## 2024-05-18 ENCOUNTER — Other Ambulatory Visit: Payer: Self-pay

## 2024-05-18 NOTE — Progress Notes (Signed)
 Specialty Pharmacy Refill Coordination Note  Cheryl Haley is a 68 y.o. female contacted today regarding refills of specialty medication(s) Dupilumab  (Dupixent )   Patient requested Delivery   Delivery date: 05/26/24   Verified address: [address]   [place] [address]   Medication will be filled on 05/25/24. Injection dates: 9/18 (has 1 inj)  & 10/2

## 2024-05-25 ENCOUNTER — Other Ambulatory Visit: Payer: Self-pay

## 2024-06-14 ENCOUNTER — Other Ambulatory Visit: Payer: Self-pay

## 2024-06-14 DIAGNOSIS — D649 Anemia, unspecified: Secondary | ICD-10-CM

## 2024-06-14 DIAGNOSIS — D473 Essential (hemorrhagic) thrombocythemia: Secondary | ICD-10-CM

## 2024-06-14 MED ORDER — HYDROXYUREA 500 MG PO CAPS: ORAL_CAPSULE | ORAL | 1 refills | Status: AC

## 2024-06-14 NOTE — Telephone Encounter (Signed)
 Pt called in requesting a refill on her Hydroxyurea . Last refilled 10/12/23. Please advise, thanks!

## 2024-06-15 ENCOUNTER — Other Ambulatory Visit (HOSPITAL_COMMUNITY): Payer: Self-pay

## 2024-06-15 ENCOUNTER — Encounter (HOSPITAL_COMMUNITY): Payer: Self-pay

## 2024-06-15 NOTE — Progress Notes (Signed)
 Specialty Pharmacy Refill Coordination Note  Cheryl Haley is a 68 y.o. female contacted today regarding refills of specialty medication(s) Dupilumab  (Dupixent )   Patient requested (Patient-Rptd) Delivery   Delivery date: 06/23/24   Verified address: (Patient-Rptd) [address] [place] [address]   Medication will be filled on 06/22/24.

## 2024-06-20 ENCOUNTER — Other Ambulatory Visit (HOSPITAL_COMMUNITY): Payer: Self-pay

## 2024-06-22 ENCOUNTER — Other Ambulatory Visit: Payer: Self-pay

## 2024-07-13 ENCOUNTER — Other Ambulatory Visit: Payer: Self-pay | Admitting: Adult Health

## 2024-07-13 ENCOUNTER — Other Ambulatory Visit: Payer: Self-pay

## 2024-07-13 DIAGNOSIS — J449 Chronic obstructive pulmonary disease, unspecified: Secondary | ICD-10-CM

## 2024-07-13 NOTE — Telephone Encounter (Signed)
 Pt requesting refill of specialty medication - routing to Rx team to advise.

## 2024-07-14 ENCOUNTER — Other Ambulatory Visit: Payer: Self-pay

## 2024-07-14 MED ORDER — DUPIXENT 300 MG/2ML ~~LOC~~ SOAJ
300.0000 mg | SUBCUTANEOUS | 1 refills | Status: DC
  Filled 2024-07-14: qty 4, 28d supply, fill #0
  Filled 2024-08-10: qty 4, 28d supply, fill #1
  Filled 2024-09-07: qty 4, 28d supply, fill #2
  Filled 2024-10-05: qty 4, 28d supply, fill #3

## 2024-07-14 NOTE — Telephone Encounter (Signed)
 Refill sent for DUPIXENT  to [facility]: [phone]   Dose: 300mg  [place] every 14 days   Last OV: 04/21/24 Provider: Dr. Geronimo  Next OV: 08/11/24  Aleck Puls, PharmD, BCPS Clinical Pharmacist  [facility]

## 2024-07-18 ENCOUNTER — Other Ambulatory Visit: Payer: Self-pay

## 2024-07-18 ENCOUNTER — Other Ambulatory Visit: Payer: Self-pay | Admitting: Pharmacy Technician

## 2024-07-18 NOTE — Progress Notes (Signed)
 Specialty Pharmacy Refill Coordination Note  Cheryl Haley is a 68 y.o. female contacted today regarding refills of specialty medication(s) Dupilumab  (Dupixent )   Patient requested Delivery   Delivery date: 07/22/24   Verified address: [address] [place]   Medication will be filled on: 07/21/24

## 2024-07-20 ENCOUNTER — Other Ambulatory Visit: Payer: Self-pay

## 2024-08-10 ENCOUNTER — Other Ambulatory Visit: Payer: Self-pay

## 2024-08-10 NOTE — Patient Instructions (Addendum)
 ICD-10-CM   1. Chronic obstructive pulmonary disease, unspecified COPD type (HCC)  J44.9     2. Chronic respiratory failure with hypoxia (HCC)  J96.11     3. Lung nodules  R91.8     4. Need for pneumococcal vaccine  Z23        Chronic obstructive pulmonary disease, unspecified COPD type (HCC) Chronic respiratory failure with hypoxia (HCC)  - stable disease without flare up but stilith class 3 symtoms  Plan Continue on Dupixent  injections every 2 weeks.  Continue on Prednisone  5mg  daily .  Continue on Trelegy daily  , rinse after use.  Robitussin DM As needed  for cough/congestion .  Albuterol  inhaler/neb As needed   Flutter valve Twice daily   Continue on Daliresp  daily  Continue on Oxygen 3l/m  Activity as tolerated.  High protein diet .   Lung nodules - multilple  Plan  - next ct chest march 2026 (there is an order place by [facility])  Need for pneumococcal vaccine   Plan  - Prevnar 08/11/2024  Follow up  - with Dr. Geronimo in March 2026 after ct chst

## 2024-08-10 NOTE — Progress Notes (Signed)
 Synopsis: Referred in 2015 for COPD by Morgan Drivers, MD. Formerly a patient of Dr. McQuaid.  Subjective:   PATIENT ID: Cheryl Haley GENDER: female DOB: [birth year], MRN: [id number]  Chief Complaint  Patient presents with   Follow-up    Patient states that she feels better since last visit in Jan. Patient is on 3 liters with exertion and on 2 liters at home with concentrator. Patient states that last night she coughed up some phelgm and noticed it had some blood in it. She has also noticed blood when blowing her nose so isnt sure if its from her lungs or sinusesl    Cheryl Haley is a 68 year old woman with a history of COPD and chronic respiratory failure on home oxygen who presents for follow-up.  She had an exacerbation requiring hospitalization in January 2020, but has not been hospitalized for COPD otherwise.  Since that exacerbation she has not required antibiotics or prednisone .  She continues on Spiriva  once daily and Symbicort  2 puffs twice daily.  She reports that she runs out her Symbicort  early.  She just felt an inhaler on 3/25 and reports that she already is less than 20 puffs.  She is spoken to her pharmacist about this.  She has no significant cough or sputum production, wheezing.  Her dyspnea on exertion is at baseline.  She walks slower than her peers and can only walk less than 1 block.  She does not use albuterol  due to lack of perceived benefit.  She is able to complete her daily activities.  She is up-to-date on pneumonia and flu vaccines.  She uses 3 L pulsed oxygen with her Energen and is on 2 L continuous at home. Trelegy was previously prescribed in January, which her insurance did not cover.  Former tobacco abuse before quitting in 2018, 80-pack-year history.  Significant Hospital Events:   68 yo female, former smoker, with chronic hypoxic (2L) resp failure who presented with progressive dyspnea and weakness.  She had recent hospitalization at [facility] for COPD exacerbation  (her only one in several years).  She had persistent dyspnea and fever (temp 101).  She is followed in pulmonary office by Dr. Alaine and to establish with Dr. Dewald.(changed to DR Geronimo 08/26/2020)   She developed fever, decreased appetite, fatigue and weakness earlier this month.  She was admitted to [facility] and treated for COPD exacerbation.  Several days after discharge she developed recurrence of her symptoms.  She also thinks she has lost about 10 lbs.  She has felt her heart rate going fast.  She quit smoking 3-4 years ago.  No recent travel history or sick exposures.  No animal/bird exposures.   TN, PNA, CVA, HLD, COPD with emphysema, Chronic hypoxic respiratory failure, Hypothyroidism, Hiatal hernia, Essential thrombocythemia, OA, Anxiety, Influenza A January 2015  Spirometry 12/26/13 >> FEV1 0.8 (33%), FEV1% 41 CT angio chest 08/22/20 >> no acute process, negative for PE, bilateral pulmonary nodules, severe background emphysema     12/21 Admit 12/22 PCCM consulted  12/23  - start solumedrol 12/24 - Pt reports feeling better than on admission but not back to baseline .On 2L   1225 = ABG without hypercapnia.  Apprehensive about using BiPAP.  I think she use CPAP for few hours last night.  Subjective improvement not clear.  This morning she tells me that even though she is better since the time we started Solu-Medrol  2 days ago she does not think she is as good as yesterday.  She complains  of some dyspnea on exertion.  She is willing to try noninvasive positive pressure ventilation again tonight.  She tells me that hospitalist has agreed to give her some Tylenol  PM to help her sleep better and then she would use the mask. She is afebrile since 08/22/2020   xxxxxxxxxxxxxxxxxxxxxxxxxxxxxx   OV 10/23/2020  Subjective:  Patient ID: Cheryl Haley, female , DOB: [birth year] , age 68 y.o. , MRN: [id number] , ADDRESS: [address] [place] [address] PCP Morgan Drivers,  MD Patient Care Team: Morgan Drivers, MD as PCP - General (Family Medicine) Shlomo Wilbert SAUNDERS, MD as PCP - Cardiology (Cardiology)  This Provider for this visit: Treatment Team:  Attending Provider: Geronimo Amel, MD    10/23/2020 -   Chief Complaint  Patient presents with   New Patient (Initial Visit)    COPD   Gold stage III COPD with chronic hypoxemic respiratory failure without hypercapnia -last PFT 2015 Alpha-1 MS  HPI Cheryl Haley 68 y.o. -returns for post hospital follow-up.  I saw him Christmas 2021 for COPD exacerbation.  She has advanced COPD alpha-1 MS.  After that she is followed up with nurse practitioner.  She continues to do well.  She is at home.  She is doing physical therapy 1 time a week.  Her sister joined with he today; Cheryl Haley.  Patient lives in [place].  The sister lives in [place].  She prefers [facility] for healthcare destination.  She is getting home physical therapy 1 time a week.  She says this is not enough.  I have asked her to talk to the therapist and get a prescription for the second day in a week or have the therapist come 2 times a week.  She uses triple inhaler therapy and oxygen 2 L nasal cannula.  She says even the simplest of exertions make her short of breath but she is slowly better.  She is resigned to the fact she might never go back to her previous baseline but also encouraged by the fact she is slowly improving.  CAT score today 17 documented below.  She is interested in Roflumilast  after we discussed this with her.  She is interested in pulmonary rehabilitation after we discussed this with her.  Last PFT was in 2015   CAT Score 10/23/2020  Total CAT Score 17        OV 03/05/2021  Subjective:  Patient ID: Cheryl Haley, female , DOB: 1955/11/12 , age 68 y.o. , MRN: [id number] , ADDRESS: [address] [place] [address] PCP Morgan Drivers, MD Patient Care Team: Morgan Drivers, MD as PCP - General (Family  Medicine) Shlomo Wilbert SAUNDERS, MD as PCP - Cardiology (Cardiology)  This Provider for this visit: Treatment Team:  Attending Provider: Geronimo Amel, MD   Gold stage III COPD with chronic hypoxemic respiratory failure without hypercapnia  -last PFT 2015 Multiple lung nodules  - 9mm LUL and 12mm Lingula (sice 2018 and decreaed to current size in feb 2020)  Alpha-1 MS  03/05/2021 -   Chief Complaint  Patient presents with   Follow-up    Pt states she has been doing better since last visit. States she still becomes SOB but states that she does not become SOB as quick as before after getting walking cart.     HPI Cheryl Haley 68 y.o. -returns for follow-up.  She presents with? Siser .  She at this point is continue oxygen 2 L at rest and uses 3 L portable when she does exercise.  She continues on Breztri .  She started Roflumilast  last visit tolerating it well.  She says albuterol  inhaler does not help her but nebulizer does.  She is wondering about using it.  I told her it is just a rescue medication.  She feels COPD is well controlled at this point.  However she did try some prednisone  she found at home and she used 10 mg a day for 2 weeks and she found some modest improvement in her symptoms she wants to use prednisone  daily.  She agreed to adjust to 5 mg/day.  We discussed the long-term side effects and indications for prednisone  and well COPD.  She definitely wants to give it a try.  She has a new primary care nurse practitioner.  Dr. Morgan has retired.  She is at the fully register with the nurse practitioner.  She says that she has not had any osteoporosis evaluation.  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  She try to enroll in pulm rehabilitation but has not heard back from [facility].  She thought she was to be doing pulm rehabilitation by now.  Her last CT scan of the chest was in December 2021.  She knows she needs an annual CT chest in December 2022.   CAT Score 10/23/2020   Total CAT Score 17    05/10/2021 Follow up : COPD, O2 RF and Pneumonia  HPI: 68 year old female former smoker  followed for COPD and chronic respiratory failure on oxygen, pulmonary nodules On chronic steroids (prednisone  5mg  daily started 03/2021 )    TEST/EVENTS :  Chest Imaging- films reviewed: CT chest 06/28/2019- severe centrilobular emphysema throughout both lungs.  Left upper lobe spiculated nodule associated with linear scar.  Right middle lobe spiculated nodule.  Airway thickening.  Scarring in right greater than left lower lobes.  No significant mediastinal or hilar adenopathy.  Nodules stable or smaller than previous CT scans.   CT cardiac scoring 10/07/2019- redemonstrated right middle lobe nodule.  No new concerning nodules.   Pulmonary Functions Testing Results: No flowsheet data found.     Spirometry 2015: FVC  2.0 (63%) FEV1  0.8 (33%) Ratio 41   10/25/2019 SPECT scan: EF 63%, low risk study.  No EKG changes during stress.  Patient returns for a follow-up visit.  Patient has underlying severe COPD and chronic hypoxic respiratory failure on oxygen.  Patient was recently hospitalized last month for a COPD exacerbation.  Prior to admission patient was diagnosed with COVID-19 March 19, 2021.  She was treated with IV steroids, and antibiotics nebulized bronchodilators.  Since discharge patient continued to have some ongoing symptoms of cough and shortness of breath.  2 weeks ago patient started having increased pleuritic pain shortness of breath.  He went to the emergency room April 30, 2021, CT chest showed no pulmonary embolism.  Positive for a lingular consolidation consistent with pneumonia.  Advanced emphysema.  Previous left apical nodularity was improved.  Right lower lobe nodule enlarged, and a new nodule in the right upper lobe.  And a new right lower lobe nodule noted.  Patient was started on Augmentin  and doxycycline .  Finished yesterday . Since emergency room visit  patient is starting to feel better, with decreased cough and congestion . Regaining some strength , trying to walk for short distances .  Weight is down 23lbs over last year . Has gained 5 lbs since getting home and is eating better.  No hemoptysis , edema or n//v/d.    OV 06/26/2021  Subjective:  Patient ID: Cheryl Haley, female , DOB: 12-20-1955 , age 68 y.o. , MRN: [id number] , ADDRESS: [address] [place] [address] PCP [facility] Patient Care Team: [facility] as PCP - General (Family Medicine) Shlomo Wilbert SAUNDERS, MD as PCP - Cardiology (Cardiology)  This Provider for this visit: Treatment Team:  Attending Provider: Orlie Madelin RAMAN, NP    06/26/2021 -   Chief Complaint  Patient presents with   Follow-up    Pt states she has been doing okay since last visit. Pt still becomes SOB with exertion.      HPI Cheryl Haley 68 y.o. - dping well from symptoms standpoint. Continues o2. Had CT in ER - has cluster of right sided nodule. One of them (personally visualized) on right side is lobulated . Looking back was small in Dec 2021 and now grown Aug2022/Oct 2022. Agreee with radiology is concering for early stage lung cancer. Review of labs shows high co2 recently. Continues berztri, o2 and rofluminalst and daily prednisone     04/21/2024 Follow up : COPD, O2 RF, Lung nodules  Discussed the use of AI scribe software for clinical note transcription with the patient, who gave verbal consent to proceed.  History of Present Illness Larisha Vencill is a 68 year old female with COPD, O2 RF, Lung nodularity  who presents for a three-month checkup.  She has experienced no hospitalizations since her last visit, overall feels her breathing is doing about the same.  She gets very short of breath with minimal activity.  She is on chronic oxygen 3 L.  Last visit patient was recovering from recent recurrent  hospitalization from February and March for COPD exacerbation.  She is on maximum therapy with chronic steroids prednisone  5 mg daily Trelegy once daily, Daliresp , albuterol  nebulizer.  At last visit she was started on Dupixent  injection.  Says that she has started that they seem to be doing well.  She has had no known side effects.  Has not noticed any significant change in her breathing but is tolerating well.   Her last hospitalizations occurred in February and March of this year. She has been on a stable dose of prednisone  for an extended period. She switched from Breztri  to Trelegy due to insurance reasons. She has not been using the nebulizer recently.  She reports no hemoptysis and notes a slight weight increase, nearing 100 pounds, BMI 17.  Trying to have a higher protein diet.  Was set up for an echo last visit that showed preserved EF,Moderate LVH, grade 1 diastolic dysfunction, normal RV size and no valvular disease.  We reviewed her test results in detail.       OV 08/11/2024  Subjective:  Patient ID: Cheryl Haley, female , DOB: [birth year] , age 20 y.o. , MRN: [id number] , ADDRESS: [address] [place] [address] PCP Verdia Lombard, MD Patient Care Team: Verdia Lombard, MD as PCP - General (Internal Medicine) Shlomo Wilbert SAUNDERS, MD as PCP - Cardiology (Cardiology)  This Provider for this visit: Treatment Team:  Attending Provider: Geronimo Amel, MD  Gold stage III COPD with chronic hypoxemic respiratory failure without hypercapnia in dec 2021 but positive hypercapnia July 2022 -last PFT 2015  Multiple lung nodules - lung cancer ; last CT MArch 2025   Alpha-1 MS  08/11/2024 -   Chief Complaint  Patient presents with   COPD    Pt states since LOV breathing has been  about the same No SOB No cough Pt is currently on 3L of 02 poc     HPI Cheryl Haley 68 y.o. -Roxsana Riding is a 68 year old female with COPD who presents for a  follow-up on her breathing and medication management. An dnodules  Her breathing has remained stable, described as 'about the same.' She uses oxygen at a rate of three liters per minute continuously. There have been no hospitalizations or emergency room visits this year. Room mate is indy historian and endores stability. She takes prednisone  daily at a dose of five milligrams and receives Dupixent  injections, which she feels are effective as she has not had any flare-ups. She performs daily exercises at home, learned during a rehabilitation program. She experiences shortness of breath with activities such as showering or changing clothes and is unable to climb a flight of stairs without stopping. Interim Health status: No new complaints No new medical problems. No new surgeries. No ER visits. No Urgent care visits. No changes to medications   Her last pneumonia vaccination was in 2018, and she received her flu shot a few months ago. She also reports having received the RSV vaccine, although it is not documented in her records.  Has multiple nodules: last CT March 2025. NExt one MArch 2026 already ordred by APP Tammy  PFT     Latest Ref Rng & Units 02/16/2023    9:52 AM 07/19/2021   10:13 AM  PFT Results  FVC-Pre L 1.54  1.48   FVC-Predicted Pre % 50  47   FVC-Post L 1.52    FVC-Predicted Post % 49    Pre FEV1/FVC % % 38  36   Post FEV1/FCV % % 36    FEV1-Pre L 0.58  0.54   FEV1-Predicted Pre % 24  22   FEV1-Post L 0.55    DLCO uncorrected ml/min/mmHg 5.59  5.56   DLCO UNC% % 28  28   DLCO corrected ml/min/mmHg 5.70  5.56   DLCO COR %Predicted % 29  28   DLVA Predicted % 46  47   TLC L 6.30    TLC % Predicted % 124    RV % Predicted % 221         LAB RESULTS last 96 hours No results found.       has a past medical history of Agatston coronary artery calcium  score greater than 400, Anxiety, Arthritis, COPD (chronic obstructive pulmonary disease) (HCC), Diabetes (HCC),  Dyspnea, Emphysema lung (HCC), Essential thrombocythemia (HCC) (09/15/2019), History of hiatal hernia, TIA (transient ischemic attack) and stroke, Hyperlipidemia, Hypertension, Hypothyroidism, Impaired fasting glucose, Pleural effusion (05/15/2014), Pneumonia, Protein-calorie malnutrition, severe (09/17/2018), Pulmonary nodules (07/14/2018), Thyroid disease, Tobacco abuse (11/28/2015), Unspecified hypothyroidism (09/20/2013), and Viral respiratory infection (09/16/2018).   reports that she quit smoking about 7 years ago. Her smoking use included cigarettes. She started smoking about 47 years ago. She has a 80 pack-year smoking history. She has never used smokeless tobacco.  Past Surgical History:  Procedure Laterality Date   BRONCHIAL BIOPSY  08/20/2021   Procedure: BRONCHIAL BIOPSIES;  Surgeon: Brenna Adine CROME, DO;  Location: [facility];  Service: Pulmonary;;   BRONCHIAL BRUSHINGS  08/20/2021   Procedure: BRONCHIAL BRUSHINGS;  Surgeon: Brenna Adine CROME, DO;  Location: [facility];  Service: Pulmonary;;   BRONCHIAL NEEDLE ASPIRATION BIOPSY  08/20/2021   Procedure: BRONCHIAL NEEDLE ASPIRATION BIOPSIES;  Surgeon: Brenna Adine CROME, DO;  Location: [facility];  Service: Pulmonary;;   BRONCHIAL WASHINGS  08/20/2021  Procedure: BRONCHIAL WASHINGS;  Surgeon: Brenna Adine CROME, DO;  Location: [facility];  Service: Pulmonary;;   COLONOSCOPY WITH PROPOFOL  N/A 04/06/2023   Procedure: COLONOSCOPY WITH PROPOFOL ;  Surgeon: Federico Rosario BROCKS, MD;  Location: [facility];  Service: Gastroenterology;  Laterality: N/A;   ESOPHAGOGASTRODUODENOSCOPY     EYE SURGERY     FIDUCIAL MARKER PLACEMENT  08/20/2021   Procedure: FIDUCIAL MARKER PLACEMENT;  Surgeon: Brenna Adine CROME, DO;  Location: [facility];  Service: Pulmonary;;   POLYPECTOMY  04/06/2023   Procedure: POLYPECTOMY;  Surgeon: Federico Rosario BROCKS, MD;  Location: [facility];  Service: Gastroenterology;;   VIDEO BRONCHOSCOPY WITH ENDOBRONCHIAL NAVIGATION Left  07/29/2017   Procedure: VIDEO BRONCHOSCOPY WITH ENDOBRONCHIAL NAVIGATION;  Surgeon: Shelah Lamar RAMAN, MD;  Location: [facility];  Service: Thoracic;  Laterality: Left;   VIDEO BRONCHOSCOPY WITH RADIAL ENDOBRONCHIAL ULTRASOUND  08/20/2021   Procedure: RADIAL ENDOBRONCHIAL ULTRASOUND;  Surgeon: Brenna Adine CROME, DO;  Location: [facility];  Service: Pulmonary;;    Allergies  Allergen Reactions   Bisoprolol  Fumarate Other (See Comments)    Other reaction(s): heart issues    Immunization History  Administered Date(s) Administered   Fluad Quad(high Dose 65+) 06/06/2020, 05/10/2021, 06/05/2022   Fluad Trivalent(High Dose 65+) 05/28/2023   Influenza Split 05/31/2015, 06/07/2018, 05/09/2019   Influenza,inj,Quad PF,6+ Mos 09/22/2013, 06/10/2016, 06/25/2017, 04/30/2018, 06/01/2019   Influenza,inj,quad, With Preservative 09/07/2015   PFIZER(Purple Top)SARS-COV-2 Vaccination 11/27/2019, 12/18/2019   Pneumococcal Conjugate-13 04/17/2015, 08/11/2024   Pneumococcal Polysaccharide-23 12/08/2016   Tdap 04/10/2017    Family History  Problem Relation Age of Onset   Heart disease Mother    Emphysema Father    Heart disease Father    Emphysema Sister    Emphysema Sister    Heart disease Sister    Congestive Heart Failure Sister    Congestive Heart Failure Sister    Cancer Sister        lung   Cancer Sister        breast   Emphysema Brother    Heart disease Brother    Heart disease Brother    Liver disease Neg Hx    Esophageal cancer Neg Hx    Colon cancer Neg Hx      Current Outpatient Medications:    alendronate (FOSAMAX) 70 MG tablet, Take 70 mg by mouth every Saturday., Disp: , Rfl:    amitriptyline  (ELAVIL ) 50 MG tablet, Take 50 mg by mouth at bedtime., Disp: , Rfl:    aspirin  EC 81 MG tablet, Take 81 mg daily by mouth. , Disp: , Rfl:    Dupilumab  (DUPIXENT ) 300 MG/2ML SOAJ, Inject 300 mg into the skin every 14 (fourteen) days., Disp: 12 mL, Rfl: 1   Evolocumab  (REPATHA  SURECLICK) 140  MG/ML SOAJ, Inject 140 mg into the skin every 14 (fourteen) days., Disp: 6 mL, Rfl: 3   hydroxyurea  (HYDREA ) 500 MG capsule, TAKE ONE CAPSULE BY MOUTH EVERY OTHER DAY WITH FOOD TO MINIMIZE GI SIDE EFFECTS, Disp: 45 capsule, Rfl: 1   levothyroxine  (SYNTHROID ) 75 MCG tablet, Take 1 tablet (75 mcg total) by mouth every morning., Disp: 30 tablet, Rfl: 1   metoprolol  succinate (TOPROL  XL) 25 MG 24 hr tablet, Take 1 tablet (25 mg total) by mouth daily., Disp: 90 tablet, Rfl: 3   Multiple Vitamin (MULTIVITAMIN WITH MINERALS) TABS tablet, Take 1 tablet by mouth daily., Disp: , Rfl:    OXYGEN, Inhale 2 L into the lungs continuous., Disp: , Rfl:    roflumilast  (DALIRESP ) 500 MCG TABS  tablet, TAKE 1 TABLET BY MOUTH DAILY, Disp: 90 tablet, Rfl: 1   rosuvastatin  (CRESTOR ) 40 MG tablet, Take 1 tablet (40 mg total) by mouth daily., Disp: 90 tablet, Rfl: 3   TRELEGY ELLIPTA  100-62.5-25 MCG/ACT AEPB, INHALE 1 PUFF BY MOUTH EVERY DAY, Disp: 60 each, Rfl: 5      Objective:   Vitals:   08/11/24 0823  BP: 116/60  Pulse: (!) 101  Temp: 98.2 F (36.8 C)  TempSrc: Oral  SpO2: 94%  Weight: 100 lb 3.2 oz (45.5 kg)  Height: 5' 4 (1.626 m)    Estimated body mass index is 17.2 kg/m as calculated from the following:   Height as of this encounter: 5' 4 (1.626 m).   Weight as of this encounter: 100 lb 3.2 oz (45.5 kg).  @WEIGHTCHANGE @  [employer]   08/11/24 0823  Weight: 100 lb 3.2 oz (45.5 kg)     Physical Exam   General: No distress. Looks well O2 at rest: yes Cane present: no Sitting in wheel chair: no Frail: YES Obese: no Neuro: Alert and Oriented x 3. GCS 15. Speech normal Psych: Pleasant Resp:  Barrel Chest - YES.  Wheeze - no, Crackles - no, No overt respiratory distress CVS: Normal heart sounds. Murmurs - no Ext: Stigmata of Connective Tissue Disease - nono HEENT: Normal upper airway. PEERL +. No post nasal drip        Assessment/     Assessment & Plan Chronic obstructive  pulmonary disease, unspecified COPD type (HCC)  Chronic respiratory failure with hypoxia (HCC)  Lung nodules  Need for pneumococcal vaccine    PLAN Patient Instructions     ICD-10-CM   1. Chronic obstructive pulmonary disease, unspecified COPD type (HCC)  J44.9     2. Chronic respiratory failure with hypoxia (HCC)  J96.11     3. Lung nodules  R91.8     4. Need for pneumococcal vaccine  Z23        Chronic obstructive pulmonary disease, unspecified COPD type (HCC) Chronic respiratory failure with hypoxia (HCC)  - stable disease without flare up but stilith class 3 symtoms  Plan Continue on Dupixent  injections every 2 weeks.  Continue on Prednisone  5mg  daily .  Continue on Trelegy daily  , rinse after use.  Robitussin DM As needed  for cough/congestion .  Albuterol  inhaler/neb As needed   Flutter valve Twice daily   Continue on Daliresp  daily  Continue on Oxygen 3l/m  Activity as tolerated.  High protein diet .   Lung nodules - multilple  Plan  - next ct chest march 2026 (there is an order place by [facility])  Need for pneumococcal vaccine   Plan  - Prevnar 08/11/2024  Follow up  - with Dr. Geronimo in March 2026 after ct chst    FOLLOWUP    Return for - with Dr. Geronimo in March 2026 after ct chst.    SIGNATURE    Dr. Dorethia Geronimo, M.D., F.C.C.P,  Pulmonary and Critical Care Medicine Staff Physician, [facility] Center Director - Interstitial Lung Disease  Program  Pulmonary Fibrosis [facility] [place], [place], [address]  Pager: [phone], If no answer or between  15:00h - 7:00h: call [phone] Telephone: [phone]  8:53 AM 08/11/2024

## 2024-08-11 ENCOUNTER — Encounter: Payer: Self-pay | Admitting: Internal Medicine

## 2024-08-11 ENCOUNTER — Ambulatory Visit (INDEPENDENT_AMBULATORY_CARE_PROVIDER_SITE_OTHER): Admitting: Internal Medicine

## 2024-08-11 VITALS — BP 116/60 | HR 101 | Temp 98.2°F | Ht 64.0 in | Wt 100.2 lb

## 2024-08-11 DIAGNOSIS — J449 Chronic obstructive pulmonary disease, unspecified: Secondary | ICD-10-CM | POA: Diagnosis not present

## 2024-08-11 DIAGNOSIS — Z23 Encounter for immunization: Secondary | ICD-10-CM | POA: Diagnosis not present

## 2024-08-11 DIAGNOSIS — J9611 Chronic respiratory failure with hypoxia: Secondary | ICD-10-CM | POA: Diagnosis not present

## 2024-08-11 DIAGNOSIS — R918 Other nonspecific abnormal finding of lung field: Secondary | ICD-10-CM | POA: Diagnosis not present

## 2024-08-13 ENCOUNTER — Other Ambulatory Visit: Payer: Self-pay | Admitting: Adult Health

## 2024-08-15 ENCOUNTER — Other Ambulatory Visit: Payer: Self-pay

## 2024-08-15 NOTE — Progress Notes (Signed)
 Specialty Pharmacy Refill Coordination Note  Cheryl Haley is a 68 y.o. female contacted today regarding refills of specialty medication(s) Dupilumab  (Dupixent )   Patient requested Delivery   Delivery date: 08/18/24   Verified address: [address] [place]   Medication will be filled on: 08/17/24

## 2024-08-16 ENCOUNTER — Other Ambulatory Visit (HOSPITAL_COMMUNITY): Payer: Self-pay

## 2024-08-16 ENCOUNTER — Telehealth: Payer: Self-pay

## 2024-08-16 NOTE — Telephone Encounter (Signed)
 Received referral from Dr. Geronimo on 08/11/24 for [employer]  new start.   Opening benefits investigation in this thread.

## 2024-08-16 NOTE — Telephone Encounter (Signed)
 Received [employer]  new start paperwork. Completed form and faxed with clinicals and insurance card copy to [facility]   Phone#: [phone] Fax#: [phone]

## 2024-08-17 ENCOUNTER — Other Ambulatory Visit: Payer: Self-pay

## 2024-08-17 NOTE — Telephone Encounter (Signed)
 Received fax from [employer] confirming receipt of enrollment form.   ID [id number]

## 2024-08-18 NOTE — Telephone Encounter (Signed)
 Received fax from [employer] with summary of benefits. Referral form for Ohtuvayre  received. Rx will be triaged to [facility] (phone: [phone]). Once benefits investigation completed, pharmacy will reach out the patient to schedule shipment. If medication is unaffordable, patient will need to express financial hardship to be referred back to [facility] for patient assistance program pre-screening.   Patient ID: [id number] Pharmacy phone: [facility] (phone: [phone]) [facility] Phone#: [phone]

## 2024-09-07 ENCOUNTER — Other Ambulatory Visit: Payer: Self-pay

## 2024-09-07 ENCOUNTER — Other Ambulatory Visit (HOSPITAL_COMMUNITY): Payer: Self-pay

## 2024-09-07 NOTE — Progress Notes (Signed)
 Specialty Pharmacy Refill Coordination Note  Cheryl Haley is a 69 y.o. female contacted today regarding refills of specialty medication(s) Dupilumab  (Dupixent )   Patient requested Delivery   Delivery date: 09/16/24   Verified address: [address] [place]    Medication will be filled on: 09/15/24  Patient aware of $4.90 copay and provided updated cc

## 2024-09-08 ENCOUNTER — Telehealth: Payer: Self-pay | Admitting: *Deleted

## 2024-09-08 NOTE — Telephone Encounter (Signed)
 Copied from CRM [id number]. Topic: Clinical - Prescription Issue >> Sep 06, 2024  3:02 PM Dedra B wrote: Reason for CRM: Jenna, Pharmacist with [facility] said they received a prescription for Ohtuvayre  but it had the patient's signature at the bottom instead of the provider's. Pls fax prescription with provider's signature to [phone]. If any questions, Jenna can be reached at [phone].  ------------------------------------------------------------------------------------------------------------------------------------------------  Duwaine, Please advise.  Thank you.

## 2024-09-15 ENCOUNTER — Other Ambulatory Visit: Payer: Self-pay

## 2024-09-19 NOTE — Telephone Encounter (Unsigned)
 Copied from CRM [id number]. Topic: Clinical - Prescription Issue >> Sep 06, 2024  3:02 PM Dedra B wrote: Reason for CRM: Jenna, Pharmacist with [facility] said they received a prescription for Ohtuvayre  but it had the patient's signature at the bottom instead of the provider's. Pls fax prescription with provider's signature to [phone]. If any questions, Jenna can be reached at [phone]. >> Sep 16, 2024  4:08 PM Rozanna MATSU wrote: Pt called about the prescription Ohtuvayre  stated [facility] has rec'd the paperwork with Dr. Geronimo signature.

## 2024-10-04 ENCOUNTER — Other Ambulatory Visit: Payer: Self-pay

## 2024-10-05 ENCOUNTER — Other Ambulatory Visit: Payer: Self-pay

## 2024-10-05 ENCOUNTER — Other Ambulatory Visit (HOSPITAL_COMMUNITY): Payer: Self-pay

## 2024-10-05 ENCOUNTER — Ambulatory Visit

## 2024-10-05 DIAGNOSIS — J449 Chronic obstructive pulmonary disease, unspecified: Secondary | ICD-10-CM

## 2024-10-05 DIAGNOSIS — Z79899 Other long term (current) drug therapy: Secondary | ICD-10-CM

## 2024-10-05 MED ORDER — DUPIXENT 300 MG/2ML ~~LOC~~ SOAJ
300.0000 mg | SUBCUTANEOUS | 2 refills | Status: AC
  Filled 2024-10-05: qty 4, 28d supply, fill #0

## 2024-10-05 NOTE — Progress Notes (Signed)
 [facility] - Continuation of Therapy with Biologic  Referring Provider: Dorethia Cave (received verbal authorization of referral)  Virtual Visit via Telephone Note  I connected with Consuelo Bookman on 10/05/24 at  4:50 PM EST by telephone and verified that I am speaking with the correct person using two identifiers.  Location: Patient: home Provider: office   I discussed the limitations, risks, security and privacy concerns of performing an evaluation and management service by telephone and the availability of in person appointments. I also discussed with the patient that there may be a patient responsible charge related to this service. The patient expressed understanding and agreed to proceed.  HPI: Cheryl Haley is a 69 y.o. female who presents to the pharmacotherapy clinic via telephone for continuation of therapy with Dupixent . Last OV with Dr. Cave was on 08/11/24.   Indication: COPD Dosing: 300mg  every 2 weeks  Patient's current respiratory regimen: Trelegy, Daliresp , Albuterol  PRN  Patient Active Problem List   Diagnosis Date Noted   Acute on chronic respiratory failure with hypoxia secondary to RSV with acute COPD exacerbation 10/16/2023   RSV (respiratory syncytial virus pneumonia) 10/16/2023   Sinus tachycardia 10/16/2023   Rectal bleeding 03/16/2023   Hyperlipidemia 09/02/2022   S/P bronchoscopy with biopsy    Agatston coronary artery calcium  score greater than 400 07/16/2021   COVID 03/28/2021   Hx of TIA (transient ischemic attack) and stroke    Oral thrush 09/10/2020   Medication management 09/10/2020   Abnormal findings on diagnostic imaging of lung 09/10/2020   Healthcare maintenance 09/10/2020   Acute on chronic respiratory failure with hypoxia and hypercapnia (HCC)    Hyponatremia 08/21/2020   Hypokalemia 08/21/2020   Essential thrombocythemia (HCC) 09/15/2019   Protein-calorie malnutrition, severe 09/17/2018   COPD  exacerbation (HCC) 09/15/2018   Pulmonary nodules 07/14/2018   Pulmonary nodule, left    Chronic respiratory failure with hypoxia (HCC) 06/10/2016   Pleural effusion 05/15/2014   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   AMITRIPTYLINE  (ELAVIL ) 50 MG TABLET    Take 50 mg by mouth at bedtime.   ASPIRIN  EC 81 MG TABLET    Take 81 mg daily by mouth.    EVOLOCUMAB  (REPATHA  SURECLICK) 140 MG/ML SOAJ    Inject 140 mg into the skin every 14 (fourteen) days.   HYDROXYUREA  (HYDREA ) 500 MG CAPSULE    TAKE ONE CAPSULE BY MOUTH EVERY OTHER DAY WITH FOOD TO MINIMIZE GI SIDE EFFECTS   LEVOTHYROXINE  (SYNTHROID ) 75 MCG TABLET    Take 1 tablet (75 mcg total) by mouth every morning.   METOPROLOL  SUCCINATE (TOPROL  XL) 25 MG 24 HR TABLET    Take 1 tablet (25 mg total) by mouth daily.   MULTIPLE VITAMIN (MULTIVITAMIN WITH MINERALS) TABS TABLET    Take 1 tablet by mouth daily.   OXYGEN    Inhale 2 L into the lungs continuous.   PREDNISONE  (DELTASONE ) 5 MG TABLET    TAKE 1 TABLET BY MOUTH EVERY DAY WITH BREAKFAST   ROFLUMILAST  (DALIRESP ) 500 MCG TABS TABLET    TAKE 1 TABLET BY MOUTH DAILY   ROSUVASTATIN  (CRESTOR ) 40  MG TABLET    Take 1 tablet (40 mg total) by mouth daily.   TRELEGY ELLIPTA  100-62.5-25 MCG/ACT AEPB    INHALE 1 PUFF BY MOUTH EVERY DAY  Modified Medications   Modified Medication Previous Medication   DUPILUMAB  (DUPIXENT ) 300 MG/2ML SOAJ Dupilumab  (DUPIXENT ) 300 MG/2ML SOAJ      Inject 300 mg into the skin every 14 (fourteen) days.    Inject 300 mg into the skin every 14 (fourteen) days.  Discontinued Medications   No medications  on file    Allergies: Allergies[1]  Past Medical History: Past Medical History:  Diagnosis Date   Agatston coronary artery calcium  score greater than 400    score 678   Anxiety    Arthritis    in neck   COPD (chronic obstructive pulmonary disease) (HCC)    Diabetes (HCC)    Dyspnea    Emphysema lung (HCC)    Essential thrombocythemia (HCC) 09/15/2019   History of hiatal hernia    Hx of TIA (transient ischemic attack) and stroke    Hyperlipidemia    Hypertension    Hypothyroidism    Impaired fasting glucose    Pleural effusion 05/15/2014   CXR 05/2014:  New small right effusion.     Pneumonia    Protein-calorie malnutrition, severe 09/17/2018   Pulmonary nodules 07/14/2018   07/2017 CT chest >pulmonary nodules noted.  07/2017 PET scan -Hypermetabolic LUL nodule  12/2017 CT chest >decreased nodule size, new pulmonary nodules noted.  FOB -atypical cells  CT chest 07/2018 >stable nodules to smaller on established nodules . 2 new nodules RUL , RLL >CT chest 3 months    Thyroid disease    Tobacco abuse 11/28/2015   Smoked 2 packs of cigarettes daily from teenage years until age 77, quit in 2016    Unspecified hypothyroidism 09/20/2013   Viral respiratory infection 09/16/2018    Social History: Social History   Socioeconomic History   Marital status: Divorced    Spouse name: Not on file   Number of children: 0   Years of education: Not on file   Highest education level: 9th grade  Occupational History   Occupation: retired  Tobacco Use   Smoking status: Former    Current packs/day: 0.00    Average packs/day: 2.0 packs/day for 40.0 years (80.0 ttl pk-yrs)    Types: Cigarettes    Start date: 03/16/1977    Quit date: 03/16/2017    Years since quitting: 7.5   Smokeless tobacco: Never   Tobacco comments:       Vaping Use   Vaping status: Never Used  Substance and Sexual Activity   Alcohol use: Not Currently   Drug use: No   Sexual activity: Not Currently  Other  Topics Concern   Not on file  Social History Narrative   Kemoni lives with her sister. Trudi likes to put puzzles together and watch tv.   Social Drivers of Health   Tobacco Use: Medium Risk (08/11/2024)   Patient History    Smoking Tobacco Use: Former    Smokeless Tobacco Use: Never    Passive Exposure: Not on file  Financial Resource Strain: Not on file  Food Insecurity: No Food Insecurity (11/17/2023)   Hunger Vital Sign    Worried About Running Out of Food in the Last Year: Never true    Ran Out of Food in the Last Year: Never true  Transportation Needs: No Transportation Needs (11/17/2023)   PRAPARE - Transportation  Lack of Transportation (Medical): No    Lack of Transportation (Non-Medical): No  Physical Activity: Not on file  Stress: Not on file  Social Connections: Socially Isolated (11/12/2023)   Social Connection and Isolation Panel    Frequency of Communication with Friends and Family: Three times a week    Frequency of Social Gatherings with Friends and Family: Once a week    Attends Religious Services: Never    Database Administrator or Organizations: No    Attends Banker Meetings: Never    Marital Status: Divorced  Depression (PHQ2-9): Low Risk (04/11/2024)   Depression (PHQ2-9)    PHQ-2 Score: 0  Alcohol Screen: Not on file  Housing: Low Risk (11/17/2023)   Housing Stability Vital Sign    Unable to Pay for Housing in the Last Year: No    Number of Times Moved in the Last Year: 0    Homeless in the Last Year: No  Utilities: Not At Risk (11/17/2023)   [employer] Utilities    Threatened with loss of utilities: No  Health Literacy: Not on file      Assessment/Plan: 1. Patient prescribed Dupixent  for COPD. Reviewed the medication with the patient, including the following:   Goals of therapy: Mechanism: monoclonal antibody used for the treatment of asthma or asthma/COPD overlap Reviewed that Dupixent  is add-on medication and patient must continue  maintenance inhaler regimen. Response to therapy: may take 3-4 months to determine efficacy.  Side effects: injection site reaction, antibody development, arthralgia, ocular effects  Dose: Dupixent  300mg  once every 2 weeks  Administration/Storage:  Administer as a SubQ injection and rotate sites.  Keep medication in refrigerator, do not freeze.  Allow the medication to reach room temp prior to administration (45 mins for 300 mg syringe or 30 min for 200 mg syringe). Do not shake.   Plan:  - Patient will continue injections at home every 2 weeks.  - Rx will be triaged to [facility] for delivery to patient home.   I discussed the assessment and treatment plan with the patient. The patient was provided an opportunity to ask questions and all were answered. The patient agreed with the plan and demonstrated an understanding of the instructions.   The patient was advised to call back or seek an in-person evaluation if the symptoms worsen or if the condition fails to improve as anticipated.  I provided 10 minutes of non-face-to-face time during this encounter.  Patient verbalizes understanding and agreement with plan.   Delon Brow, PharmD, CSP, AAHIVP, CPP Clinical Pharmacist Practitioner - Medication Therapy Disease Management/Specialty Pharmacy Services 10/05/2024, 5:13 PM     [1]  Allergies Allergen Reactions   Bisoprolol  Fumarate Other (See Comments)    Other reaction(s): heart issues

## 2024-10-05 NOTE — Progress Notes (Signed)
 Specialty Pharmacy Refill Coordination Note  Spoke with Cheryl Haley is a 69 y.o. female contacted today regarding refills of specialty medication(s) Dupilumab  (Dupixent )  Doses on hand: 1 for 2/5  Next inj: 10/20/24   Patient requested: Delivery   Delivery date: 10/14/24   Verified address: [address] [place]  [address]  Medication will be filled on 10/13/24

## 2024-10-06 ENCOUNTER — Other Ambulatory Visit: Payer: Self-pay

## 2024-10-06 NOTE — Telephone Encounter (Signed)
 Called [facility]. They stated that the patient signed where the prescriber was supposed to. I confirmed that we can just redo the one page and have Dr. Geronimo sign it. We will not have to redo the entire paperwork. I have updated the pt via MyChart, and have routed this message to Ashanti as she is working with Dr. Geronimo on Monday, so she can ensure this gets signed.   Once signed, will fax back to [phone].

## 2024-10-06 NOTE — Telephone Encounter (Signed)
 ATC to make sure that pt has everything she needs, LVMTCB. Also sending MyChart message.

## 2024-10-06 NOTE — Addendum Note (Signed)
 Addended by: Jamarr Treinen L on: 10/06/2024 08:47 AM   Modules accepted: Orders

## 2024-10-11 ENCOUNTER — Inpatient Hospital Stay: Admitting: Medical Oncology

## 2024-10-11 ENCOUNTER — Inpatient Hospital Stay

## 2024-10-12 ENCOUNTER — Ambulatory Visit: Admitting: Medical Oncology

## 2024-10-12 ENCOUNTER — Inpatient Hospital Stay

## 2024-11-25 ENCOUNTER — Ambulatory Visit: Admitting: Internal Medicine

## 2024-12-15 ENCOUNTER — Ambulatory Visit: Admitting: Cardiology
